# Patient Record
Sex: Male | Born: 1935
Health system: Southern US, Community
[De-identification: ages and names within clinical notes are randomized; demographics above are authoritative.]

## PROBLEM LIST (undated history)

## (undated) DIAGNOSIS — Z951 Presence of aortocoronary bypass graft: Secondary | ICD-10-CM

## (undated) DIAGNOSIS — I2510 Atherosclerotic heart disease of native coronary artery without angina pectoris: Secondary | ICD-10-CM

## (undated) DIAGNOSIS — Z7901 Long term (current) use of anticoagulants: Secondary | ICD-10-CM

## (undated) DIAGNOSIS — I4729 Other ventricular tachycardia: Secondary | ICD-10-CM

## (undated) DIAGNOSIS — T8859XA Other complications of anesthesia, initial encounter: Secondary | ICD-10-CM

## (undated) DIAGNOSIS — I480 Paroxysmal atrial fibrillation: Secondary | ICD-10-CM

## (undated) DIAGNOSIS — I350 Nonrheumatic aortic (valve) stenosis: Secondary | ICD-10-CM

## (undated) DIAGNOSIS — H04123 Dry eye syndrome of bilateral lacrimal glands: Secondary | ICD-10-CM

## (undated) DIAGNOSIS — E785 Hyperlipidemia, unspecified: Secondary | ICD-10-CM

## (undated) DIAGNOSIS — R053 Chronic cough: Secondary | ICD-10-CM

## (undated) DIAGNOSIS — I10 Essential (primary) hypertension: Secondary | ICD-10-CM

## (undated) DIAGNOSIS — K219 Gastro-esophageal reflux disease without esophagitis: Secondary | ICD-10-CM

## (undated) DIAGNOSIS — R05 Cough: Secondary | ICD-10-CM

## (undated) DIAGNOSIS — I4821 Permanent atrial fibrillation: Secondary | ICD-10-CM

## (undated) DIAGNOSIS — Z9889 Other specified postprocedural states: Secondary | ICD-10-CM

## (undated) DIAGNOSIS — R112 Nausea with vomiting, unspecified: Secondary | ICD-10-CM

## (undated) DIAGNOSIS — M1990 Unspecified osteoarthritis, unspecified site: Secondary | ICD-10-CM

## (undated) DIAGNOSIS — I472 Ventricular tachycardia: Secondary | ICD-10-CM

## (undated) DIAGNOSIS — D494 Neoplasm of unspecified behavior of bladder: Secondary | ICD-10-CM

## (undated) HISTORY — PX: CORONARY ARTERY BYPASS GRAFT: SHX141

## (undated) HISTORY — DX: Essential (primary) hypertension: I10

## (undated) HISTORY — PX: CATARACT EXTRACTION W/ INTRAOCULAR LENS IMPLANT: SHX1309

## (undated) HISTORY — PX: INGUINAL HERNIA REPAIR: SUR1180

## (undated) HISTORY — DX: Paroxysmal atrial fibrillation: I48.0

## (undated) HISTORY — PX: TONSILLECTOMY: SUR1361

## (undated) HISTORY — DX: Nonrheumatic aortic (valve) stenosis: I35.0

## (undated) HISTORY — DX: Atherosclerotic heart disease of native coronary artery without angina pectoris: I25.10

## (undated) HISTORY — PX: CARDIAC CATHETERIZATION: SHX172

## (undated) HISTORY — PX: ELECTROPHYSIOLOGY STUDY: SHX5802

## (undated) HISTORY — PX: MOHS SURGERY: SUR867

---

## 1976-04-25 HISTORY — PX: OTHER SURGICAL HISTORY: SHX169

## 1992-04-25 HISTORY — PX: KNEE ARTHROSCOPY: SUR90

## 1997-04-25 HISTORY — PX: LUMBAR DISC SURGERY: SHX700

## 1997-10-14 ENCOUNTER — Ambulatory Visit (HOSPITAL_COMMUNITY): Admission: RE | Admit: 1997-10-14 | Discharge: 1997-10-14 | Payer: Self-pay | Admitting: Gastroenterology

## 1998-02-26 ENCOUNTER — Encounter: Payer: Self-pay | Admitting: Neurological Surgery

## 1998-03-02 ENCOUNTER — Encounter: Payer: Self-pay | Admitting: Neurological Surgery

## 1998-03-02 ENCOUNTER — Inpatient Hospital Stay (HOSPITAL_COMMUNITY): Admission: RE | Admit: 1998-03-02 | Discharge: 1998-03-03 | Payer: Self-pay | Admitting: Neurological Surgery

## 1998-04-28 ENCOUNTER — Encounter: Admission: RE | Admit: 1998-04-28 | Discharge: 1998-07-27 | Payer: Self-pay | Admitting: Neurological Surgery

## 2002-04-25 DIAGNOSIS — Z951 Presence of aortocoronary bypass graft: Secondary | ICD-10-CM

## 2002-04-25 HISTORY — DX: Presence of aortocoronary bypass graft: Z95.1

## 2003-02-26 ENCOUNTER — Emergency Department (HOSPITAL_COMMUNITY): Admission: EM | Admit: 2003-02-26 | Discharge: 2003-02-26 | Payer: Self-pay | Admitting: Emergency Medicine

## 2003-03-03 ENCOUNTER — Inpatient Hospital Stay (HOSPITAL_COMMUNITY): Admission: AD | Admit: 2003-03-03 | Discharge: 2003-03-11 | Payer: Self-pay | Admitting: Cardiovascular Disease

## 2003-03-05 ENCOUNTER — Encounter (INDEPENDENT_AMBULATORY_CARE_PROVIDER_SITE_OTHER): Payer: Self-pay | Admitting: Cardiology

## 2003-04-03 ENCOUNTER — Encounter: Admission: RE | Admit: 2003-04-03 | Discharge: 2003-04-03 | Payer: Self-pay | Admitting: Cardiothoracic Surgery

## 2003-04-28 ENCOUNTER — Encounter (HOSPITAL_COMMUNITY): Admission: RE | Admit: 2003-04-28 | Discharge: 2003-07-25 | Payer: Self-pay | Admitting: *Deleted

## 2003-05-01 ENCOUNTER — Encounter: Admission: RE | Admit: 2003-05-01 | Discharge: 2003-05-01 | Payer: Self-pay | Admitting: Cardiothoracic Surgery

## 2004-03-03 ENCOUNTER — Ambulatory Visit: Payer: Self-pay | Admitting: Internal Medicine

## 2005-03-10 ENCOUNTER — Ambulatory Visit: Payer: Self-pay | Admitting: Family Medicine

## 2005-05-16 ENCOUNTER — Ambulatory Visit: Payer: Self-pay | Admitting: Internal Medicine

## 2005-07-25 ENCOUNTER — Inpatient Hospital Stay (HOSPITAL_COMMUNITY): Admission: AD | Admit: 2005-07-25 | Discharge: 2005-07-28 | Payer: Self-pay | Admitting: Cardiology

## 2005-07-25 ENCOUNTER — Ambulatory Visit: Payer: Self-pay | Admitting: Internal Medicine

## 2005-09-09 ENCOUNTER — Inpatient Hospital Stay (HOSPITAL_COMMUNITY): Admission: EM | Admit: 2005-09-09 | Discharge: 2005-09-14 | Payer: Self-pay | Admitting: Emergency Medicine

## 2005-10-05 ENCOUNTER — Emergency Department (HOSPITAL_COMMUNITY): Admission: EM | Admit: 2005-10-05 | Discharge: 2005-10-05 | Payer: Self-pay | Admitting: Emergency Medicine

## 2005-10-17 ENCOUNTER — Ambulatory Visit: Payer: Self-pay | Admitting: Internal Medicine

## 2006-02-21 ENCOUNTER — Ambulatory Visit: Payer: Self-pay | Admitting: Family Medicine

## 2006-05-30 ENCOUNTER — Ambulatory Visit: Payer: Self-pay | Admitting: Internal Medicine

## 2006-10-01 ENCOUNTER — Encounter: Payer: Self-pay | Admitting: Internal Medicine

## 2007-02-21 ENCOUNTER — Telehealth (INDEPENDENT_AMBULATORY_CARE_PROVIDER_SITE_OTHER): Payer: Self-pay | Admitting: *Deleted

## 2007-02-22 ENCOUNTER — Ambulatory Visit: Payer: Self-pay | Admitting: Internal Medicine

## 2007-02-22 ENCOUNTER — Encounter: Admission: RE | Admit: 2007-02-22 | Discharge: 2007-02-22 | Payer: Self-pay | Admitting: Internal Medicine

## 2007-02-22 DIAGNOSIS — H2100 Hyphema, unspecified eye: Secondary | ICD-10-CM | POA: Insufficient documentation

## 2007-02-22 DIAGNOSIS — I10 Essential (primary) hypertension: Secondary | ICD-10-CM | POA: Insufficient documentation

## 2007-02-22 DIAGNOSIS — R519 Headache, unspecified: Secondary | ICD-10-CM | POA: Insufficient documentation

## 2007-02-22 DIAGNOSIS — R51 Headache: Secondary | ICD-10-CM

## 2007-02-22 DIAGNOSIS — I480 Paroxysmal atrial fibrillation: Secondary | ICD-10-CM | POA: Insufficient documentation

## 2007-02-26 ENCOUNTER — Telehealth (INDEPENDENT_AMBULATORY_CARE_PROVIDER_SITE_OTHER): Payer: Self-pay | Admitting: *Deleted

## 2007-05-02 ENCOUNTER — Ambulatory Visit: Payer: Self-pay | Admitting: Internal Medicine

## 2007-05-02 DIAGNOSIS — M545 Low back pain: Secondary | ICD-10-CM

## 2007-05-07 ENCOUNTER — Telehealth: Payer: Self-pay | Admitting: Internal Medicine

## 2007-06-08 ENCOUNTER — Encounter: Payer: Self-pay | Admitting: Internal Medicine

## 2007-06-20 ENCOUNTER — Encounter: Payer: Self-pay | Admitting: Internal Medicine

## 2007-10-18 ENCOUNTER — Encounter: Payer: Self-pay | Admitting: Internal Medicine

## 2007-12-03 ENCOUNTER — Ambulatory Visit: Payer: Self-pay | Admitting: Internal Medicine

## 2007-12-03 DIAGNOSIS — J310 Chronic rhinitis: Secondary | ICD-10-CM

## 2007-12-03 DIAGNOSIS — H9120 Sudden idiopathic hearing loss, unspecified ear: Secondary | ICD-10-CM | POA: Insufficient documentation

## 2007-12-10 ENCOUNTER — Encounter: Payer: Self-pay | Admitting: Internal Medicine

## 2007-12-24 ENCOUNTER — Encounter: Payer: Self-pay | Admitting: Internal Medicine

## 2008-06-12 ENCOUNTER — Ambulatory Visit: Payer: Self-pay | Admitting: Internal Medicine

## 2008-07-07 ENCOUNTER — Encounter: Payer: Self-pay | Admitting: Internal Medicine

## 2008-07-08 ENCOUNTER — Ambulatory Visit: Payer: Self-pay | Admitting: Internal Medicine

## 2008-07-08 DIAGNOSIS — L0390 Cellulitis, unspecified: Secondary | ICD-10-CM

## 2008-07-08 DIAGNOSIS — B369 Superficial mycosis, unspecified: Secondary | ICD-10-CM | POA: Insufficient documentation

## 2008-07-08 DIAGNOSIS — L0291 Cutaneous abscess, unspecified: Secondary | ICD-10-CM | POA: Insufficient documentation

## 2008-07-10 ENCOUNTER — Ambulatory Visit: Payer: Self-pay | Admitting: Internal Medicine

## 2008-07-10 DIAGNOSIS — T887XXA Unspecified adverse effect of drug or medicament, initial encounter: Secondary | ICD-10-CM | POA: Insufficient documentation

## 2008-07-10 LAB — CONVERTED CEMR LAB: INR: 2

## 2008-07-23 ENCOUNTER — Encounter: Payer: Self-pay | Admitting: Internal Medicine

## 2008-07-30 ENCOUNTER — Encounter: Payer: Self-pay | Admitting: Internal Medicine

## 2008-07-31 ENCOUNTER — Ambulatory Visit: Payer: Self-pay | Admitting: Internal Medicine

## 2008-07-31 DIAGNOSIS — R55 Syncope and collapse: Secondary | ICD-10-CM

## 2008-07-31 DIAGNOSIS — R091 Pleurisy: Secondary | ICD-10-CM | POA: Insufficient documentation

## 2008-08-06 ENCOUNTER — Ambulatory Visit: Payer: Self-pay | Admitting: Internal Medicine

## 2008-08-07 ENCOUNTER — Encounter (INDEPENDENT_AMBULATORY_CARE_PROVIDER_SITE_OTHER): Payer: Self-pay | Admitting: *Deleted

## 2008-09-09 ENCOUNTER — Encounter: Payer: Self-pay | Admitting: Internal Medicine

## 2008-10-06 ENCOUNTER — Encounter: Payer: Self-pay | Admitting: Internal Medicine

## 2008-11-04 ENCOUNTER — Ambulatory Visit: Payer: Self-pay | Admitting: Internal Medicine

## 2008-11-04 DIAGNOSIS — R609 Edema, unspecified: Secondary | ICD-10-CM

## 2009-02-24 ENCOUNTER — Ambulatory Visit: Payer: Self-pay | Admitting: Family Medicine

## 2009-04-29 ENCOUNTER — Ambulatory Visit: Payer: Self-pay | Admitting: Family Medicine

## 2009-04-29 DIAGNOSIS — R05 Cough: Secondary | ICD-10-CM | POA: Insufficient documentation

## 2009-04-29 DIAGNOSIS — J018 Other acute sinusitis: Secondary | ICD-10-CM

## 2009-04-29 DIAGNOSIS — R059 Cough, unspecified: Secondary | ICD-10-CM | POA: Insufficient documentation

## 2009-05-12 ENCOUNTER — Telehealth: Payer: Self-pay | Admitting: Family Medicine

## 2009-08-05 ENCOUNTER — Ambulatory Visit: Payer: Self-pay | Admitting: Family Medicine

## 2009-09-17 ENCOUNTER — Ambulatory Visit: Payer: Self-pay | Admitting: Family Medicine

## 2010-01-14 ENCOUNTER — Ambulatory Visit: Payer: Self-pay | Admitting: Family Medicine

## 2010-01-29 ENCOUNTER — Encounter: Payer: Self-pay | Admitting: Internal Medicine

## 2010-03-23 ENCOUNTER — Ambulatory Visit (HOSPITAL_COMMUNITY): Admission: RE | Admit: 2010-03-23 | Discharge: 2010-03-23 | Payer: Self-pay | Admitting: Cardiovascular Disease

## 2010-03-23 ENCOUNTER — Encounter: Payer: Self-pay | Admitting: Internal Medicine

## 2010-03-23 LAB — PULMONARY FUNCTION TEST

## 2010-03-26 ENCOUNTER — Encounter: Payer: Self-pay | Admitting: Family Medicine

## 2010-05-25 NOTE — Assessment & Plan Note (Signed)
Summary: 11:15  NOT FEELING WELL/CLE   Vital Signs:  Patient profile:   76 year old male Height:      73 inches Weight:      207.25 pounds BMI:     27.44 Temp:     97.8 degrees F oral Pulse rate:   56 / minute Pulse rhythm:   regular BP sitting:   140 / 80  (left arm) Cuff size:   large  Vitals Entered By: Delilah Shan CMA Duncan Dull) (August 05, 2009 11:10 AM) CC: Sinus congestion, cough, scratchy throat.  Pt. prefers written script if he needs one.   History of Present Illness: 76 yo here for 2 weeks of worsening sinus congestion.  Has seaonsal allergies, takes Zyrtec 10 mg daily. Allergies have been worse lately so initially when he had runny nose and sneezing, thought it was just allergies. Over past week, worsening sinus pressure, keeping him up at night. Dry, hacking cough. Felt a little feverish a couple of days ago. No wheezing or SOB.  Allergy to cephalexin, not sure what it was or if he allergy to other related abx.  Current Medications (verified): 1)  Metoprolol Succinate 50 Mg Xr24h-Tab (Metoprolol Succinate) .Marland Kitchen.. 1 By Mouth Am, 1/2 By Mouth Pm 2)  Cozaar 100 Mg Tabs (Losartan Potassium) .Marland Kitchen.. 1 By Mouth Once Daily 3)  Pravastatin Sodium 80 Mg  Tabs (Pravastatin Sodium) .Marland Kitchen.. 1 By Mouth At Bedtime 4)  Warfarin Sodium 5 Mg  Tabs (Warfarin Sodium) .... 5mg  Once Daily Except 7.5 Mg On Mon/thursday 5)  Adult Aspirin Ec Low Strength 81 Mg  Tbec (Aspirin) .Marland Kitchen.. 1 By Mouth Once Daily 6)  Vitamin D 1000 Unit Tabs (Cholecalciferol) .Marland Kitchen.. 1 By Mouth Once Daily 7)  Apap/butalbital 40mg  .... As Needed 8)  Cetirizine Hcl 10 Mg Tabs (Cetirizine Hcl) .Marland Kitchen.. 1 By Mouth Once Daily 9)  Hydrochlorothiazide 12.5 Mg Caps (Hydrochlorothiazide) .Marland Kitchen.. 1 Once Daily 10)  Pacerone 100 Mg Tabs (Amiodarone Hcl) .... Take 1 Tablet By Mouth Once A Day 11)  Amlodipine Besylate 5 Mg Tabs (Amlodipine Besylate) .... Take 1 Tablet By Mouth Once A Day 12)  Azithromycin 250 Mg  Tabs (Azithromycin) .... 2 By   Mouth Today and Then 1 Daily For 4 Days  Allergies: 1)  ! Cephalexin 2)  ! Lisinopril  Review of Systems      See HPI General:  Complains of chills and fever. ENT:  Complains of nasal congestion, sinus pressure, and sore throat; denies difficulty swallowing. CV:  Denies chest pain or discomfort. Resp:  Complains of cough; denies shortness of breath, sputum productive, and wheezing.  Physical Exam  General:  in no acute distress; alert,appropriate and cooperative throughout examination non toxic appearing Nose:  mucosal erythema and mucosal edema, no obstruction.   sinuses +/- tender Mouth:  pharyngeal erythema.   no exudates Lungs:  Normal respiratory effort, chest expands symmetrically. Lungs are clear to auscultation, no crackles or wheezes. Heart:  normal rate, regular rhythm,  grade 2 /6 systolic murmur Psych:  normally interactive, good eye contact.   Impression & Recommendations:  Problem # 1:  OTHER ACUTE SINUSITIS (ICD-461.8) Assessment New Given duration and progression of symptoms, will treat with Zpack (give allergy to ceph). Continue supportive care as per pt instructions.  [facility],wants printed prescription, not electronic. His updated medication list for this problem includes:    Azithromycin 250 Mg Tabs (Azithromycin) .Marland Kitchen... 2 by  mouth today and then 1 daily for 4 days  Complete Medication List: 1)  Metoprolol Succinate 50 Mg Xr24h-tab (Metoprolol succinate) .Marland Kitchen.. 1 by mouth am, 1/2 by mouth pm 2)  Cozaar 100 Mg Tabs (Losartan potassium) .Marland Kitchen.. 1 by mouth once daily 3)  Pravastatin Sodium 80 Mg Tabs (Pravastatin sodium) .Marland Kitchen.. 1 by mouth at bedtime 4)  Warfarin Sodium 5 Mg Tabs (Warfarin sodium) .... 5mg  once daily except 7.5 mg on mon/thursday 5)  Adult Aspirin Ec Low Strength 81 Mg Tbec (Aspirin) .Marland Kitchen.. 1 by mouth once daily 6)  Vitamin D 1000 Unit Tabs (Cholecalciferol) .Marland Kitchen.. 1 by mouth once daily 7)  Apap/butalbital 40mg   .... As needed 8)   Cetirizine Hcl 10 Mg Tabs (Cetirizine hcl) .Marland Kitchen.. 1 by mouth once daily 9)  Hydrochlorothiazide 12.5 Mg Caps (Hydrochlorothiazide) .Marland Kitchen.. 1 once daily 10)  Pacerone 100 Mg Tabs (Amiodarone hcl) .... Take 1 tablet by mouth once a day 11)  Amlodipine Besylate 5 Mg Tabs (Amlodipine besylate) .... Take 1 tablet by mouth once a day 12)  Azithromycin 250 Mg Tabs (Azithromycin) .... 2 by  mouth today and then 1 daily for 4 days  Patient Instructions: 1)  Take Zpack  as directed.  Drink lots of fluids.  Treat sympotmatically with Mucinex, nasal saline irrigation, and Tylenol/Ibuprofen.  You can use warm compresses.  Cough suppressant at night. Call if not improving as expected in 5-7 days.  Prescriptions: AZITHROMYCIN 250 MG  TABS (AZITHROMYCIN) 2 by  mouth today and then 1 daily for 4 days  #6 x 0   Entered and Authorized by:   Ruthe Mannan MD   Signed by:   Ruthe Mannan MD on 08/05/2009   Method used:   Print then Give to Patient   RxID:   [id number]   Current Allergies (reviewed today): ! CEPHALEXIN ! LISINOPRIL

## 2010-05-25 NOTE — Progress Notes (Signed)
Summary: Update on condition  Phone Note Call from Patient Call back at [phone]   Caller: Patient Call For: Ruthe Mannan MD Summary of Call: Was told to call you back after he finished his Zpak. Patient states that he has finished it and is doing much better and is starting on the 14 day trial of Prilosec today. Initial call taken by: Sydell Axon LPN,  May 12, 2009 12:58 PM  Follow-up for Phone Call        Thank you. Follow-up by: Ruthe Mannan MD,  May 12, 2009 8:16 PM

## 2010-05-25 NOTE — Assessment & Plan Note (Signed)
Summary: NEW MEDICARE/[facility] TRANSFER FROM [place]   Vital Signs:  Patient profile:   76 year old male Height:      73 inches Weight:      208.25 pounds BMI:     27.57 Temp:     98.5 degrees F oral Pulse rate:   72 / minute Pulse rhythm:   regular BP sitting:   168 / 82  (left arm) Cuff size:   large  Vitals Entered By: Delilah Shan CMA Duncan Dull) (April 29, 2009 2:00 PM) CC: New Medicare patient - Transfer from [place]   History of Present Illness: Here to transfer care with:  1.  Cough x 5 months.  Dry.  Feels like something is caught in throat.  Used to have GERD symptoms but not since he had CABG.  Up until last week, no fevers, chills.  No wheezing, no shortness of breath.  2.  Sinus congestion- started a little over a week ago. Nasal congestion, chills, subjective fevers. Making his cough worse.   Tried Mucinex with no relief of symptoms.  3.  Afib- currently stable.  On amiodarone 200 mg daily, Metoprolol 50 mg 1 tab qam, 1/2 tab q pm, Coumadin.  Followed by [facility] cards (for INR as well).  4.  HTN- typically not this high (168/82) but was nervous about today's visit.  Always feels higher when he has a cold as well.  No CP, no blurred vision, no LE edema.  5.  Well man- UTD on all.  States he recently had lab work at [place], he will send it to me.  Current Medications (verified): 1)  Metoprolol Succinate 50 Mg Xr24h-Tab (Metoprolol Succinate) .Marland Kitchen.. 1 By Mouth Am, 1/2 By Mouth Pm 2)  Amiodarone Hcl 200 Mg  Tabs (Amiodarone Hcl) .Marland Kitchen.. 1 By Mouth Once Daily 3)  Cozaar 100 Mg Tabs (Losartan Potassium) .Marland Kitchen.. 1 By Mouth Once Daily 4)  Pravastatin Sodium 80 Mg  Tabs (Pravastatin Sodium) .Marland Kitchen.. 1 By Mouth At Bedtime 5)  Warfarin Sodium 5 Mg  Tabs (Warfarin Sodium) .... 5mg  Once Daily Except 7.5 Mg On Mon/thursday 6)  Adult Aspirin Ec Low Strength 81 Mg  Tbec (Aspirin) .Marland Kitchen.. 1 By Mouth Once Daily 7)  Vitamin D 1000 Unit Tabs (Cholecalciferol) .Marland Kitchen.. 1 By Mouth Once Daily 8)   Apap/butalbital 40mg  .... As Needed 9)  Cetirizine Hcl 10 Mg Tabs (Cetirizine Hcl) .Marland Kitchen.. 1 By Mouth Once Daily 10)  Hydrochlorothiazide 12.5 Mg Caps (Hydrochlorothiazide) .Marland Kitchen.. 1 Once Daily 11)  Azithromycin 250 Mg  Tabs (Azithromycin) .... 2 By  Mouth Today and Then 1 Daily For 4 Days  Allergies: 1)  ! Cephalexin 2)  ! Lisinopril  Past History:  Past Medical History: Last updated: 07/31/2008 HTN Pleurisy 20 yrs ago A-fib   Past Surgical History: h/o CABG  Social History: Retired Married Non smoker  Review of Systems      See HPI General:  Complains of chills and fever. Eyes:  Denies blurring. ENT:  Complains of postnasal drainage and sinus pressure. CV:  Denies chest pain or discomfort. Resp:  Complains of cough and sputum productive; denies shortness of breath and wheezing. GI:  Denies abdominal pain, change in bowel habits, indigestion, nausea, and vomiting.  Physical Exam  General:  in no acute distress; alert,appropriate and cooperative throughout examination Ears:  External ear exam shows no significant lesions or deformities.  Otoscopic examination reveals clear canals, tympanic membranes are intact bilaterally without bulging, retraction, inflammation or discharge. Hearing is grossly normal  bilaterally. Nose:  mucosal erythema.  +sinuses Mouth:  Oral mucosa and oropharynx without lesions or exudates.  Teeth in good repair. Lungs:  Normal respiratory effort, chest expands symmetrically. Lungs are clear to auscultation, no crackles or wheezes. Heart:  normal rate, regular rhythm,  grade 2 /6 systolic murmur Abdomen:  Bowel sounds positive,abdomen soft and non-tender without masses, organomegaly or hernias noted. Extremities:  trace left pedal edema and trace right pedal edema.   No clubbing Psych:  normally interactive, good eye contact.   Impression & Recommendations:  Problem # 1:  COUGH (ICD-786.2) Assessment New Ongoing for several months, aggrevated by  current sinusitis.  ?possible GERD vs side effects of amiodarone. After Zpack for sinusitis, advised him to call me back. If cough persists, may try PPI for a couple of weeks (with caution since he is on coumadin).  Problem # 2:  OTHER ACUTE SINUSITIS (ICD-461.8) Assessment: New Continue supportive care, Zpack given.  Advised calling [facility] to have INR checked in a day or so on abx. His updated medication list for this problem includes:    Azithromycin 250 Mg Tabs (Azithromycin) .Marland Kitchen... 2 by  mouth today and then 1 daily for 4 days  Problem # 3:  HYPERTENSION, ESSENTIAL NOS (ICD-401.9) Assessment: Deteriorated Likely white coat syndrome with new doctor along with URI.  He will check at home and keep log for me.   His updated medication list for this problem includes:    Metoprolol Succinate 50 Mg Xr24h-tab (Metoprolol succinate) .Marland Kitchen... 1 by mouth am, 1/2 by mouth pm    Cozaar 100 Mg Tabs (Losartan potassium) .Marland Kitchen... 1 by mouth once daily    Hydrochlorothiazide 12.5 Mg Caps (Hydrochlorothiazide) .Marland Kitchen... 1 once daily  Problem # 4:  FIBRILLATION, ATRIAL (ICD-427.31) Continue current meds. His updated medication list for this problem includes:    Metoprolol Succinate 50 Mg Xr24h-tab (Metoprolol succinate) .Marland Kitchen... 1 by mouth am, 1/2 by mouth pm    Amiodarone Hcl 200 Mg Tabs (Amiodarone hcl) .Marland Kitchen... 1 by mouth once daily    Warfarin Sodium 5 Mg Tabs (Warfarin sodium) ..... 5mg  once daily except 7.5 mg on mon/thursday    Adult Aspirin Ec Low Strength 81 Mg Tbec (Aspirin) .Marland Kitchen... 1 by mouth once daily  Complete Medication List: 1)  Metoprolol Succinate 50 Mg Xr24h-tab (Metoprolol succinate) .Marland Kitchen.. 1 by mouth am, 1/2 by mouth pm 2)  Amiodarone Hcl 200 Mg Tabs (Amiodarone hcl) .Marland Kitchen.. 1 by mouth once daily 3)  Cozaar 100 Mg Tabs (Losartan potassium) .Marland Kitchen.. 1 by mouth once daily 4)  Pravastatin Sodium 80 Mg Tabs (Pravastatin sodium) .Marland Kitchen.. 1 by mouth at bedtime 5)  Warfarin Sodium 5 Mg Tabs (Warfarin  sodium) .... 5mg  once daily except 7.5 mg on mon/thursday 6)  Adult Aspirin Ec Low Strength 81 Mg Tbec (Aspirin) .Marland Kitchen.. 1 by mouth once daily 7)  Vitamin D 1000 Unit Tabs (Cholecalciferol) .Marland Kitchen.. 1 by mouth once daily 8)  Apap/butalbital 40mg   .... As needed 9)  Cetirizine Hcl 10 Mg Tabs (Cetirizine hcl) .Marland Kitchen.. 1 by mouth once daily 10)  Hydrochlorothiazide 12.5 Mg Caps (Hydrochlorothiazide) .Marland Kitchen.. 1 once daily 11)  Azithromycin 250 Mg Tabs (Azithromycin) .... 2 by  mouth today and then 1 daily for 4 days  Patient Instructions: 1)  Nice to meet you, Mr. Goyer. 2)  Try taking Priosec 20 mg  daily for 4 weeks.  Let me know if that helps with your cough. 3)  I called a Zpack into your pharmacy. 4)  Try to have your records sent to [place]. 5)  Have a wonderful week. Prescriptions: AZITHROMYCIN 250 MG  TABS (AZITHROMYCIN) 2 by  mouth today and then 1 daily for 4 days  #6 x 0   Entered and Authorized by:   Ruthe Mannan MD   Signed by:   Ruthe Mannan MD on 04/29/2009   Method used:   Print then Give to Patient   RxID:   [id number] AZITHROMYCIN 250 MG  TABS (AZITHROMYCIN) 2 by  mouth today and then 1 daily for 4 days  #6 x 0   Entered and Authorized by:   Ruthe Mannan MD   Signed by:   Ruthe Mannan MD on 04/29/2009   Method used:   Electronically to        [facility]* (retail)       [address] [place], [place]  [address]       Ph: [phone] or [phone]       Fax: [phone]   RxID:   [id number]   TD Result Date:  05/11/2005 TD Result:  historical TD Next Due:  10 yr Pneumovax Result Date:  04/17/2002 Pneumovax Result:  historical Herpes Zoster Result Date:  05/17/2007 Herpes Zoster Result:  historical Flex Sig Next Due:  Not Indicated Colonoscopy Result Date:  10/05/2007 Colonoscopy Result:  polyps Colonoscopy Next Due:  5 yr Hemoccult Next Due:  Not Indicated   Current Allergies (reviewed today): !  CEPHALEXIN ! LISINOPRIL

## 2010-05-25 NOTE — Assessment & Plan Note (Signed)
Summary: STILL HAS COUGH   Vital Signs:  Patient profile:   75 year old male Height:      73 inches Weight:      208 pounds BMI:     27.54 Temp:     98.1 degrees F oral Pulse rate:   60 / minute Pulse rhythm:   regular BP sitting:   120 / 80  (right arm) Cuff size:   regular  Vitals Entered By: Linde Gillis CMA Duncan Dull) (Sep 17, 2009 3:04 PM) CC: coughing x one week   History of Present Illness: 76 yo male here for 7 days of worsening URI symptoms.  Went on a cruise two weeks ago, on his way home started to develop runny nose, sinus congestion.  Now he is wheezing and coughing up thick sputum. No fevers, shortness of breath or chest pain.  Taking Mucinex OTC.  Current Medications (verified): 1)  Metoprolol Succinate 50 Mg Xr24h-Tab (Metoprolol Succinate) .Marland Kitchen.. 1 By Mouth Am, 1/2 By Mouth Pm 2)  Cozaar 100 Mg Tabs (Losartan Potassium) .Marland Kitchen.. 1 By Mouth Once Daily 3)  Pravastatin Sodium 80 Mg  Tabs (Pravastatin Sodium) .Marland Kitchen.. 1 By Mouth At Bedtime 4)  Warfarin Sodium 5 Mg  Tabs (Warfarin Sodium) .... 5mg  Once Daily Except 7.5 Mg On Mon/thursday 5)  Adult Aspirin Ec Low Strength 81 Mg  Tbec (Aspirin) .Marland Kitchen.. 1 By Mouth Once Daily 6)  Vitamin D 1000 Unit Tabs (Cholecalciferol) .Marland Kitchen.. 1 By Mouth Once Daily 7)  Apap/butalbital 40mg  .... As Needed 8)  Cetirizine Hcl 10 Mg Tabs (Cetirizine Hcl) .Marland Kitchen.. 1 By Mouth Once Daily 9)  Hydrochlorothiazide 12.5 Mg Caps (Hydrochlorothiazide) .Marland Kitchen.. 1 Once Daily 10)  Pacerone 100 Mg Tabs (Amiodarone Hcl) .... Take 1 Tablet By Mouth Once A Day 11)  Amlodipine Besylate 5 Mg Tabs (Amlodipine Besylate) .... Take 1 Tablet By Mouth Once A Day 12)  Doxycycline Hyclate 100 Mg Caps (Doxycycline Hyclate) .... Take 1 Tab Twice A Day X 10 Days 13)  Avelox 400 Mg Tabs (Moxifloxacin Hcl) .Marland Kitchen.. 1 Tab By Mouth X 5 Days  Allergies: 1)  ! Cephalexin 2)  ! Lisinopril  Review of Systems      See HPI General:  Denies fever. ENT:  Complains of nasal congestion, sinus  pressure, and sore throat. CV:  Denies chest pain or discomfort. Resp:  Complains of cough, sputum productive, and wheezing; denies shortness of breath.  Physical Exam  General:  in no acute distress; alert,appropriate and cooperative throughout examination non toxic appearing Nose:  mucosal erythema and mucosal edema, no obstruction.   sinuses +/- tender Mouth:  pharyngeal erythema.   no exudates Lungs:  Normal respiratory effort, chest expands symmetrically. Lungs are clear to auscultation, no crackles or wheezes. Heart:  normal rate, regular rhythm,  grade 2 /6 systolic murmur Extremities:  trace left pedal edema and trace right pedal edema.   No clubbing Psych:  normally interactive, good eye contact.   Impression & Recommendations:  Problem # 1:  OTHER ACUTE SINUSITIS (ICD-461.8) Assessment New  with ?bronchitis.  Will treat with Doxcycline 100 mg by mouth two times a day x 10 days.  Continue supportive care with Mucinex. His updated medication list for this problem includes:    Doxycycline Hyclate 100 Mg Caps (Doxycycline hyclate) .Marland Kitchen... Take 1 tab twice a day x 10 days    Avelox 400 Mg Tabs (Moxifloxacin hcl) .Marland Kitchen... 1 tab by mouth x 5 days  His updated medication list for this problem includes:  Doxycycline Hyclate 100 Mg Caps (Doxycycline hyclate) .Marland Kitchen... Take 1 tab twice a day x 10 days    Avelox 400 Mg Tabs (Moxifloxacin hcl) .Marland Kitchen... 1 tab by mouth x 5 days  Complete Medication List: 1)  Metoprolol Succinate 50 Mg Xr24h-tab (Metoprolol succinate) .Marland Kitchen.. 1 by mouth am, 1/2 by mouth pm 2)  Cozaar 100 Mg Tabs (Losartan potassium) .Marland Kitchen.. 1 by mouth once daily 3)  Pravastatin Sodium 80 Mg Tabs (Pravastatin sodium) .Marland Kitchen.. 1 by mouth at bedtime 4)  Warfarin Sodium 5 Mg Tabs (Warfarin sodium) .... 5mg  once daily except 7.5 mg on mon/thursday 5)  Adult Aspirin Ec Low Strength 81 Mg Tbec (Aspirin) .Marland Kitchen.. 1 by mouth once daily 6)  Vitamin D 1000 Unit Tabs (Cholecalciferol) .Marland Kitchen.. 1 by mouth  once daily 7)  Apap/butalbital 40mg   .... As needed 8)  Cetirizine Hcl 10 Mg Tabs (Cetirizine hcl) .Marland Kitchen.. 1 by mouth once daily 9)  Hydrochlorothiazide 12.5 Mg Caps (Hydrochlorothiazide) .Marland Kitchen.. 1 once daily 10)  Pacerone 100 Mg Tabs (Amiodarone hcl) .... Take 1 tablet by mouth once a day 11)  Doxycycline Hyclate 100 Mg Caps (Doxycycline hyclate) .... Take 1 tab twice a day x 10 days 12)  Avelox 400 Mg Tabs (Moxifloxacin hcl) .Marland Kitchen.. 1 tab by mouth x 5 days Prescriptions: AVELOX 400 MG TABS (MOXIFLOXACIN HCL) 1 tab by mouth x 5 days  #5 x 0   Entered and Authorized by:   Ruthe Mannan MD   Signed by:   Ruthe Mannan MD on 09/17/2009   Method used:   Print then Give to Patient   RxID:   [id number] DOXYCYCLINE HYCLATE 100 MG CAPS (DOXYCYCLINE HYCLATE) Take 1 tab twice a day x 10 days  #20 x 0   Entered and Authorized by:   Ruthe Mannan MD   Signed by:   Ruthe Mannan MD on 09/17/2009   Method used:   Print then Give to Patient   RxID:   [id number]   Current Allergies (reviewed today): ! CEPHALEXIN ! LISINOPRIL

## 2010-05-25 NOTE — Letter (Signed)
Summary: [facility]  [facility]   Imported By: Lennie Odor 02/10/2010 15:53:36  _____________________________________________________________________  External Attachment:    Type:   Image     Comment:   External Document

## 2010-05-25 NOTE — Assessment & Plan Note (Signed)
Summary: flu shot/aron/dlo  Nurse Visit   Allergies: 1)  ! Cephalexin 2)  ! Lisinopril  Immunizations Administered:  Influenza Vaccine # 1:    Vaccine Type: Fluvax 3+    Site: left deltoid    Mfr: [employer]    Dose: 0.5 ml    Route: IM    Given by: Mervin Hack CMA (AAMA)    Exp. Date: 10/23/2010    Lot #: [id number]    VIS given: 11/17/09 version given January 14, 2010.  Flu Vaccine Consent Questions:    Do you have a history of severe allergic reactions to this vaccine? no    Any prior history of allergic reactions to egg and/or gelatin? no    Do you have a sensitivity to the preservative Thimersol? no    Do you have a past history of Guillan-Barre Syndrome? no    Do you currently have an acute febrile illness? no    Have you ever had a severe reaction to latex? no    Vaccine information given and explained to patient? yes  Orders Added: 1)  Flu Vaccine 76yrs + [90658] 2)  Admin 1st Vaccine [91478]

## 2010-05-27 NOTE — Letter (Signed)
Summary: [facility]  [facility]   Imported By: Maryln Gottron 04/09/2010 14:52:35  _____________________________________________________________________  External Attachment:    Type:   Image     Comment:   External Document

## 2010-09-06 ENCOUNTER — Encounter (HOSPITAL_COMMUNITY): Payer: Self-pay | Attending: Cardiovascular Disease

## 2010-09-06 DIAGNOSIS — E785 Hyperlipidemia, unspecified: Secondary | ICD-10-CM | POA: Insufficient documentation

## 2010-09-06 DIAGNOSIS — Z5189 Encounter for other specified aftercare: Secondary | ICD-10-CM | POA: Insufficient documentation

## 2010-09-06 DIAGNOSIS — Z951 Presence of aortocoronary bypass graft: Secondary | ICD-10-CM | POA: Insufficient documentation

## 2010-09-06 DIAGNOSIS — I10 Essential (primary) hypertension: Secondary | ICD-10-CM | POA: Insufficient documentation

## 2010-09-06 DIAGNOSIS — Z87891 Personal history of nicotine dependence: Secondary | ICD-10-CM | POA: Insufficient documentation

## 2010-09-06 DIAGNOSIS — I2510 Atherosclerotic heart disease of native coronary artery without angina pectoris: Secondary | ICD-10-CM | POA: Insufficient documentation

## 2010-09-06 DIAGNOSIS — I4891 Unspecified atrial fibrillation: Secondary | ICD-10-CM | POA: Insufficient documentation

## 2010-09-07 ENCOUNTER — Encounter (HOSPITAL_COMMUNITY): Payer: Self-pay

## 2010-09-09 ENCOUNTER — Encounter (HOSPITAL_COMMUNITY): Payer: Self-pay

## 2010-09-10 NOTE — Discharge Summary (Signed)
NAME:  [name] [name], [name] [name] NO.:  [id number]   MEDICAL RECORD NO.:  [id number]                   PATIENT TYPE:  INP   LOCATION:  2031                                 FACILITY:  [facility]   PHYSICIAN:  Gwenith Daily. Tyrone Sage, M.D.            DATE OF BIRTH:  1935/02/25   DATE OF ADMISSION:  03/03/2003  DATE OF DISCHARGE:  03/11/2003                                 DISCHARGE SUMMARY   ADMITTING DIAGNOSES:  Coronary artery disease.   PAST MEDICAL HISTORY:  Paroxysmal atrial fibrillation   Dictation ended at this point.      Toribio Harbour, N.P.                  Gwenith Daily Tyrone Sage, M.D.    CTK/MEDQ  D:  03/11/2003  T:  03/11/2003  Job:  [id number]

## 2010-09-10 NOTE — Discharge Summary (Signed)
NAME:  TAMAJ, JURGENS NO.:  [id number]   MEDICAL RECORD NO.:  [id number]                   PATIENT TYPE:  INP   LOCATION:  2031                                 FACILITY:  [facility]   PHYSICIAN:  Gwenith Daily. Tyrone Sage, M.D.            DATE OF BIRTH:  1935/03/19   DATE OF ADMISSION:  03/03/2003  DATE OF DISCHARGE:  03/11/2003                                 DISCHARGE SUMMARY   DATE OF SURGERY:  March 07, 2003.   ADMITTING DIAGNOSIS:  Coronary artery disease.   PAST MEDICAL HISTORY:  History of paroxysmal atrial fibrillation, last in  1991.   PAST SURGICAL HISTORY:  1. Hernia repair, 1954 and 1990.  2. Polyp removed from the vocal cords 1978.  3. Repair of herniated disk L4-L5, 1999.   DRUG ALLERGIES:  No actual allergies; however, the patient notes a blood  pressure medicine caused a rash.  He does not remember which one.  Also, he  had been pain when he took statin many years ago.  He has not resumed the  statin.   DISCHARGE DIAGNOSES:  1. Three vessel coronary artery disease, status post coronary artery bypass     graft times five.  2. Postoperative atrial fibrillation, resolved on amiodarone.   BRIEF HISTORY:  Mr. Thornsberry is a 76 year old Caucasian man.  He presented  to the [facility] Emergency Room with a two week history of vague increasing  chest discomfort especially with exercise.  He was seen in the emergency  department on February 27, 2003 and was discharged home with instructions to  followup with his cardiologist.  He was evaluated by Dr. Jenne Campus who  recommended a Cardiolite stress test.  This was strongly positive for  ischemia, and he was referred for a cardiac catheterization.   HOSPITAL COURSE:  On March 03, 2003, Mr. Drumwright was admitted to [facility] in the care of the [facility]  Cardiology service.  Dr. Jenne Campus reviewed his Cardiolite stress exam and  agreed with the need for  cardiac catheterization.  This was performed on the  morning of March 04, 2003.  Catheterization revealed anterolateral  akinesis and apical dyskinesis, and very mild aortic insufficiency.  He has  three vessel coronary artery disease.  Ejection fraction estimated to be 40-  45%.  Because his lesion is not amenable to PCI, a cardiac surgery  consultation was requested.  He was seen later in the day by Dr. Sheliah Plane.  After examination of the patient, reviewed the available records  including the catheterization films, Dr. Tyrone Sage agreed that coronary  artery bypass grafting was the preferred treatment of choice for this  patient.  He was scheduled for March 07, 2003 to allow time for him to be  off his NSAID as well as followup echocardiogram to evaluate his aortic  valve.  Mr. Whittinghill remained stable in the hospital prior to his surgery.  A 2D echocardiogram was performed on March 05, 2003.  In summary:  There  were no left ventricular regional wall motion abnormalities.  The aortic  valve thickness was mildly increased, mildly reduced aortic leaflet  excursion consistent with mild aortic valve stenosis.  Estimated aortic  valve area was 2.15-cm2.  There was moderate fibrocalcific changes to the  aortic root.  The right ventricle was dilated.  Tricuspid regurgitation jet  was central, right atrium was dilated.  Dr. Tyrone Sage reviewed the results of  these prior to his surgery.  Also prior to surgery, he underwent arterial  evaluation.  Carotid duplex study revealed no significant carotid artery  disease.  His upper extremity exam included right Allen's test which was  abnormal.  Lower extremity exam revealed palpable pedal pulses bilaterally.   March 07, 2003, Mr. Mogel underwent the following surgical procedure  with Dr. Ramon Dredge B. Gerhardt:  1)  Coronary artery bypass grafting x 5.  Grafts placed at the time of procedure:  Left internal mammary artery  grafted  to the left anterior descending artery, saphenous vein graft to the  diagonal artery, saphenous vein graft to the circumflex artery, saphenous  vein graft in a sequential fashion to posterior descending and posterior  lateral arteries.  Vein was harvested from the right side with an endovein  harvesting technique through the right mid calf which was done with an open  surgical technique.  2)  Left arterial line placement.   Mr. Lechuga tolerated these procedures well, transferring in stable  condition to the SICU.  He remained hemodynamically stable in the immediate  postoperative period.  He was extubated several hours after arrival in the  intensive care unit.  He awoke from anesthesia neurologically intact.  With  Mr. Shaddock's history of atrial fibrillation, he was started  prophylactically on amiodarone.  Mr. Kazmi only required routine care  in the intensive care unit and was transferred to unit 2000 on postoperative  day two.  Later in the day on postoperative day two, he did develop rapid  atrial fibrillation.  He was started on Cardizem and was back in sinus  rhythm by postoperative day three.  His Cardizem was discontinued and his  beta-blocker was increased.  Mr. Quevedo has remained in sinus rhythm  since.   On March 11, 2003, postoperative day four, Mr. Podgorski reports feeling  very well.  His vital signs are stable.  He has maintained a normal sinus  rhythm for 24 hours.  His breathing is non-labored.  He has a good  respiratory effort.  His surgical incisions are all healing well.  There is  some bruising to the right thigh secondary to his endovein harvest and  cardiac catheterization.  Dr. Tyrone Sage recommendation was to allow Mr.  Blancett to go home this afternoon if he remains in sinus rhythm.  He will  go home on a tapering dose of amiodarone.  Later in the morning, he was seen by Dr. Domingo Sep of [facility].  Her   recommendation regarding statin.  She notes that he did not tolerate statin  approximately a year ago secondary to myalgias.  She recommends trying a  different statin.  Choice of statin can be determined on his followup visit  with Dr. Jenne Campus on March 25, 2003.   LABORATORY STUDIES:  March 10, 2003:  CBC, white blood cells 8.1,  hemoglobin 8.2, hematocrit 23.5, platelets 178.  Chemistries included sodium  of 135, potassium 3.9, BUN 14,  creatinine 1.0, glucose 127.   CONDITION ON DISCHARGE:  Improved.   INSTRUCTIONS ON DISCHARGE:  Include:   MEDICATIONS:  New medications:  1. Amiodarone 400 mg b.i.d. through March 14, 2003, and he is to start     200 mg b.i.d.  2. Lopressor 50 mg b.i.d.  3. Folic acid 1 mg daily.  4. Colace 200 mg daily.  He is instructed to resume the following home medications:  1. HCTZ 25 mg every day, this is a new dose for him.  2. Gemfibrozil 600 mg every day.  3. Niacin 500 mg q.h.s.  4. Aspirin 325 mg each evening.  For pain management he may have:  1. Tylox 1-2 p.o. q.4-6h. p.r.n.   ACTIVITY:  He has been asked to refrain from any driving or any heavy  lifting, pushing or pulling, also instructed to continue his breathing  exercises and daily walking.   DIET:  Continue to be heart healthy diet.   WOUND CARE:  He is to shower daily with mild soap and water.  If his  incision shows any signs of infection or if he has a fever greater than 101  degrees Fahrenheit, he is to call Dr. Dennie Maizes office.   FOLLOWUP:  1. Dr. Jenne Campus would like to see him in his office on March 25, 2003 at 9     a.m.  2. Dr. Tyrone Sage would like to see him at the [facility] office on Thursday,     April 03, 2003 at 11:30.  He will be asked to have a chest x-ray at     10:30 that morning.      Toribio Harbour, N.P.                  Gwenith Daily Tyrone Sage, M.D.    CTK/MEDQ  D:  03/11/2003  T:  03/11/2003  Job:  [id number]   cc:   Gwenith Daily. Tyrone Sage, M.D.  [address]  [place]  [place] [address]   Darlin Priestly, M.D.  [phone] [address]  [place]  [place] [address]  Fax: [phone]   Titus Dubin. Alwyn Ren, M.D. [facility]

## 2010-09-10 NOTE — Cardiovascular Report (Signed)
NAMELONELL, STAMOS NO.:  [id number]   MEDICAL RECORD NO.:  [id number]          PATIENT TYPE:  INP   LOCATION:  2107                         FACILITY:  [facility]   PHYSICIAN:  Madaline Savage, M.D.DATE OF BIRTH:  August 07, 1934   DATE OF PROCEDURE:  07/26/2005  DATE OF DISCHARGE:                              CARDIAC CATHETERIZATION   PROCEDURES PERFORMED:  1.  Selective coronary angiography by Judkins technique.  2.  Retrograde left heart catheterization.  3.  Left ventricular angiography.  4.  Successful percutaneous right femoral artery closure with Angio-Seal.   COMPLICATIONS:  None.   ENTRY SITE:  Right femoral.   DYE USED:  Omnipaque.   PATIENT PROFILE:  The patient is a 76 year old gentleman who was  hospitalized yesterday, July 25, 2005, for tachy palpitations and documented  recurrent nonsustained ventricular tachycardia episodes.  These events have  been occurring for approximately one week and had worsened the night before  the patient came to my office which was yesterday, July 25, 2005.  During  hospitalization last night he was loaded with amiodarone and his ventricular  tachycardia has been prevented in terms of any further episodes.   The patient has not had any shortness of breath or chest pain episodes of  the last week. He has only had tachy palpitations.  He takes a diuretic for  his blood pressure.  He also had an episode of loose brown stool that he  described as diarrhea that was a single episode yet his potassium level on  hospital entry was 3.7.  Cardiac enzymes have been negative for myocardial  infarction. EKGs have been negative for myocardial infarction.  The patient  has a history of coronary artery bypass grafting in 2004 and is known to  have mild aortic stenosis and mild aortic insufficiency.   Today the patient presents to the cardiac cath lab for diagnostic cardiac  catheterization.  This procedure was performed via right  percutaneous  femoral approach without complications.   RESULTS:  Pressures:  The left ventricular pressure was 150/70, end  diastolic pressure 17.  Central aortic pressure 150/75, mean of 110.  No  aortic valve gradient by pullback technique   ANGIOGRAPHIC RESULTS:  The patient had calcifications in his left main and  calcifications in his proximal LAD.  No valvular or pericardial  calcification was seen.   The left anterior descending coronary artery is a thread-like vessel in the  ostial portion of the vessel over a long segment.  It then becomes occluded  at the junction of the proximal and mid LAD and no antegrade flow is seen  thereafter. There is tubular calcification as previously noted.   Left circumflex is a nondominant vessel giving rise two obtuse marginal  branches, one proximal which is small and a second obtuse marginal branch  which is larger vessel of medium size and bifurcates distally.   The right coronary artery is a large and dominant vessel giving rise to  several posterior lateral branches, a single posterior descending branch.  This vessel has a shepherd's crook deformity proximally, some calcification  mid, and has a  stenosis of 75% mid.   Saphenous vein graft to obtuse marginal branch and sore saphenous vein graft  to the right coronary artery contains two touchdown sites, one posterior  descending and one posterolateral. Both touchdown sites are pristine, the  graft is widely patent.   The saphenous vein graft to the diagonal branch is widely patent.  This  diagonal is small distally but is patent.   The vein graft to the obtuse marginal branch two was widely patent  throughout and the distal runoff is excellent.   Internal mammary artery graft to LAD is widely patent including distal  anastomotic site and the runoff into the LAD is brisk TIMI III in class and  the vessel was 2.25-2.5 mm in diameter.   Left ventricular angiogram shows good  contractility, EF 50%, no wall motion  abnormalities and mitral regurgitation.   Right percutaneous femoral Angio-Seal was performed after the patient had a  hand injection in his right femoral artery showing good location of the  sheath to predict a good placement.   FINAL DIAGNOSIS:  1.  Nonsustained VT recurrent leading to hospitalization for tachy      palpitations.  2.  Three-vessel coronary artery disease as described above.  3.  Widely patent grafts to:      1.  LIMA to LAD.      2.  Saphenous vein graft OM two.      3.  Patent saphenous vein graft to right coronary artery sequential and          patent saphenous vein graft to diagonal.  4.  LV EF 50%.   PLAN:  The patient will have an EP consult, continue Amiodarone loading, and  be considered for an internal defibrillator.           ______________________________  Madaline Savage, M.D.     WHG/MEDQ  D:  07/26/2005  T:  07/26/2005  Job:  [id number]   cc:   Darlin Priestly, MD  Fax: [phone]   Duke Salvia, M.D.  [phone] [address]  [address]  [place]  [place] [address]

## 2010-09-10 NOTE — H&P (Signed)
NAME:  GOTHAM, RADEN NO.:  [id number]   MEDICAL RECORD NO.:  [id number]          PATIENT TYPE:  EMS   LOCATION:  MAJO                         FACILITY:  [facility]   PHYSICIAN:  Darlin Priestly, MD  DATE OF BIRTH:  [birth year]   DATE OF ADMISSION:  09/09/2005  DATE OF DISCHARGE:                                HISTORY & PHYSICAL   CHIEF COMPLAINTS:  Irregular heart rate.   HISTORY OF PRESENT ILLNESS:  Mr. Treat is a 76 year old male who was  followed by Dr. Jenne Campus.  He had bypass surgery in November 2004.  He has  recently had nonsustained V-tach and was admitted for EP evaluation in April  2007.  He was seen by Dr. Ladona Ridgel.  He was non inducible at EP studies  suggesting RV outflow tract ventricular tachycardia.  He was cathed during  that admission which showed a patent LIMA to the LAD, patent SVG to  diagonal, patent SVG to the OM-2, a patent SVG to PDA and PLA.  His EF was  50%.  Plan was for medical therapy.  We stopped his Norvasc and put him on  Calan.  We saw him in the office 08/11/2005.  He was doing well at that  time.  We told him he could take an extra metoprolol 25 mg as needed for  occasional palpitations.  He presents to the emergency room now with  complaints of irregular heart rate since Tuesday.  The patient said he did  well until Monday night.  He played tennis Monday night and when he awakened  Tuesday he noted some irregular rhythm in his chest.  He described this as a  vague anxiety feeling in his chest.  He called the office yesterday.  We  told him to take an extra Calan for a couple days to see if this would help.  He did this yesterday and this morning with no improvement and he came to  the emergency room for further evaluation.  In the emergency room he is in  atrial fibrillation which is new for him.  His ventricular response is about  90.  Denies any chest pain or shortness of breath.  He has not had syncope  or near-syncope.   PAST MEDICAL HISTORY:  Remarkable for hypertension.  He has dyslipidemia.  Previous surgeries include hernia repair 1954 and 1990, polypectomy in 1978,  and L4-L5 disk surgery 1999.   CURRENT MEDICATIONS:  1.  Lisinopril 20 mg a day.  2.  Fluvastatin 80 mg a day.  3.  Loratadine 10 mg a day.  4.  Aspirin daily.  5.  Metoprolol 25 mg b.i.d.  6.  Verapamil 120 mg a day.   ALLERGIES:  He has no known drug allergies but is intolerant to ZOCOR which  caused myalgias.   SOCIAL HISTORY:  He is divorced and has three children and three  grandchildren.  He is a remote smoker.   FAMILY HISTORY:  Is unremarkable.   REVIEW OF SYSTEMS:  Essentially unremarkable except for above.  He denies  any GI bleeding or melena.  He has not had fever  or chills.  Has not taken  any over-the-counter medications.   PHYSICAL EXAM:  VITAL SIGNS:  118/72, pulse 90, respirations 12.  GENERAL:  He is a well-developed, well-nourished male in no acute distress.  HEENT: Normocephalic, atraumatic.  Extraocular movements intact.  Sclerae  anicteric.  NECK:  Without JVD and without bruit.  CHEST: Clear to auscultation and percussion.  HEART:  Exam reveals irregularly irregular rhythm without obvious murmur,  rub or gallop.  ABDOMEN:  Nontender.  No bruits.  EXTREMITIES: Without edema.  Distal pulses are intact.  NEUROLOGY:  Neuro exam grossly intact.  He is awake, alert and oriented,  cooperative and moves all extremities without obvious deficit.   His EKG shows atrial fibrillation with occasional PVC and couplets.   LABORATORY DATA:  Labs were pending.   Chest x-ray from 07/25/2005 showed no active disease.   IMPRESSION:  1.  Atrial fibrillation, new problem for Mr. Massoud.  2.  Recent RV outflow tract V-tach, evaluated by Dr. Ladona Ridgel.  3.  Coronary disease, coronary artery bypass grafting November 2004 with      recent catheterization revealing patent grafts and good LV function.  4.  Treated  hypertension.  5.  Treated dyslipidemia with history of Zocor intolerance.  6.  Remote smoker.   PLAN:  The patient will be admitted for IV heparin and Coumadin.  We will  make further adjustments to his medications per Dr. Jenne Campus.      Abelino Derrick, P.A.      Darlin Priestly, MD  Electronically Signed    LKK/MEDQ  D:  09/09/2005  T:  09/09/2005  Job:  [id number]   cc:   Titus Dubin. Alwyn Ren, M.D. [facility]  [phone] [place]  [place] [address]

## 2010-09-10 NOTE — Consult Note (Signed)
NAME:  [name] [name], [name] [name]                     ACCOUNT NO.:  [id number]   MEDICAL RECORD NO.:  [id number]                   PATIENT TYPE:  INP   LOCATION:  2015                                 FACILITY:  [facility]   PHYSICIAN:  Gwenith Daily. Tyrone Sage, M.D.            DATE OF BIRTH:  June 19, 1934   DATE OF CONSULTATION:  03/04/2003  DATE OF DISCHARGE:                                   CONSULTATION   REQUESTING PHYSICIAN:  Dr. Jenne Campus.   FOLLOWUP CARDIOLOGIST:  Dr. Alanda Amass.   PRIMARY CARE PHYSICIAN:  Dr. Alwyn Ren.   REASON FOR CONSULTATION:  Severe three-vessel coronary artery disease and  mild to moderate left ventricular dysfunction.   HISTORY OF PRESENT ILLNESS:  The patient is a 76 year old male who presented  with a two-week history of vague increasing chest discomfort, especially  with exercise. He notes that he walks up a hill each morning which began  brining on chest discomfort and then heaviness in both arms. This again  occurred while he was playing tennis. He presented to [facility] emergency  room on November 4 and was then discharged home subsequently, and he saw Dr.  Jenne Campus. A stress test was positive, and he was referred for cardiac  catheterization. The patient has previous history of cardiac catheterization  in 1991. At that time, he presented with paroxysmal atrial fibrillation,  brief episode, and had a false-positive stress test, and at that time  underwent cardiac catheterization. He has had no recurrent atrial  fibrillation in the past 8 to 10 years. He has had no prior myocardial  infarction, angioplasty, or cardiac surgery. He did have an echocardiogram  in 1998 which showed mild aortic insufficiency, no aortic stenosis. Report  of this was revealed and obtained from the [facility] office and placed on the  patient's chart. Cardiac risk factors include hypertension. His lipid status  was unknown. He has a remote history of smoking, quitting in 1982.   He has a  family history of a father who died at age 32 of MVA. Mother of  cancer at age 1. He has three brothers, one who has had bypass surgery. No  previous stroke. No peripheral vascular disease. No known renal  insufficiency.   PAST MEDICAL HISTORY:  As noted above, PAF in 1991.   PAST SURGICAL HISTORY:  1. Hernia repairs in 1954 and 1990.  2. Polyp removal from the vocal cord in 1978.  3. Repair of herniated disk L4-L5 1999.   SOCIAL HISTORY:  The patient lives with his ex-wife. Has three children and  three grandchildren. He is retired from [employer]. Drinks occasional alcohol.   MEDICATIONS:  1. Etodolac 40 mg a day.  2. Hydrochlorothiazide 25 mg one and a half tablet a day.  3. Lisinopril 20 mg a day.  4. Aspirin one a day.  5. Loratadine 10 mg a day.  6. Gemfibrozil 600 mg a day.  7. Niacin 500 mg a day.  8. Verapamil 180 mg a day.  9. Multivitamin.  10.      Vitamin C.   DRUG ALLERGIES:  The patient notes some blood pressure medicine in the past  caused a rash but does not remember which one. In addition, he noted back  pain when he took a statin years ago. He is not taking one recently.   REVIEW OF SYSTEMS:  CARDIAC:  Positive for chest pain, resting shortness of  breath, exertional shortness of breath, and palpitations. Denies syncope,  presyncope, orthopnea, or lower extremity edema. GENERAL:  Denies any  constitutional symptoms such as fever, chills, or night sweats. RESPIRATORY:  Has a history of allergic rhinitis. GENITOURINARY:  Status post colonoscopy  in September 2004 by Dr. Kinnie Scales. NEUROLOGICAL:  Denies any TIAs or amaurosis  symptoms. MUSCULOSKELETAL:  Arthritis, especially in the hands and ankles.  GENITOURINARY:  Positive for nocturia, negative for hematuria. Has some  trouble with urinary stream. Denies any recent infections. Denies any easy  bruisability or other coagulopathy. Denies any psychiatric history. Denies  TIAs or amaurosis. EYES:  Wears glasses.    PHYSICAL EXAMINATION:  VITAL SIGNS:  Blood pressure 118/80, pulse 68 and  regular, respiratory rate 18, O2 saturation is 96% on room air, 6 feet 11  inches tall, 210.9 pounds.  GENERAL:  The patient is a healthy appearing 76 year old male, alert and  able to relate his history without difficulty.  HEENT:  Pupils are equal, round, and reactive to light.  NECK:  Jugular venous distention or carotid bruits.  CHEST:  Grade 2/6 diastolic murmur heard at the left apex consistent with  aortic insufficiency.  ABDOMEN:  Benign without palpable masses or tenderness.  EXTREMITIES:  Without pedal edema. He appears to have adequate length in  both lower legs.  NEUROLOGICAL:  Grossly intact. There is no palpable cervical,  supraclavicular lymph nodes.  SKIN:  Without ischemic changes.  VASCULAR EXAM:  Reveals 2+ PT and DP pulses. Catheterization site without  abnormality.   LABORATORY FINDINGS:  Hematocrit 42%, white count 4.9, platelet count  271,000. PT 12.9, INR 1, PTT 30. Sodium 139, creatinine 0.9, glucose 106.  TSH 1.97.   Cardiac catheterization films were reviewed showing anterolateral akinesis  and apical dyskinesis, very mild aortic insufficiency. LAD has a long 90%  proximal lesion, prior to a reasonable first diagonal after the take off of  the diagonal to LAD totally occluded. Circumflex has a 60% ostial and then a  90% proximal lesion. Right coronary artery has a 50% proximal and a 80 to  90% proximal lesion lateral to the LAD.   IMPRESSION AND RECOMMENDATIONS:  A 76 year old male with two week history of  increasing anginal symptoms, positive stress test, and three-vessel coronary  artery disease. He also has a very distant history of paroxysmal atrial  fibrillation in 1991 but none since. After reviewing the films and seeing  the patient, I agreed with the recommendations to proceed with coronary artery bypass grafting with his symptoms, positive stress test, and  significant  three-vessel disease. The risks of the procedure and options  were discussed with the patient including the risk of death, infection,  stroke, myocardial infarction, need for blood transfusion. Possible use of  radial artery was also discussed. The patient had his questions answered.   We planned to proceed with bypass surgery, but after several days off of  Adalat and also would need to obtain a recent echocardiogram to evaluate the  valve situation. Tentative plan for  surgery on Friday March 07, 2003.                                               Gwenith Daily Tyrone Sage, M.D.    Tyson Babinski  D:  03/04/2003  T:  03/05/2003  Job:  [id number]

## 2010-09-10 NOTE — Discharge Summary (Signed)
NAMELAZAR, TIERCE NO.:  [id number]   MEDICAL RECORD NO.:  [id number]          PATIENT TYPE:  INP   LOCATION:  3706                         FACILITY:  [facility]   PHYSICIAN:  Darlin Priestly, MD  DATE OF BIRTH:  11/04/1934   DATE OF ADMISSION:  09/09/2005  DATE OF DISCHARGE:  09/14/2005                                 DISCHARGE SUMMARY   DISCHARGE DIAGNOSES:  1.  Paroxysmal atrial fibrillation, sinus rhythm at discharge.  2.  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  3.  Coronary artery bypass grafting November of 2004 with patent grafts      April of 2007.  4.  Good left ventricular function with an ejection fraction of 50%.  5.  Treated hypertension, now somewhat hypotensive.  6.  Treated hyperlipidemia.   HOSPITAL COURSE:  The patient is a 76 year old male who is followed by Dr.  Jenne Campus and Dr. Alwyn Ren with a history of coronary disease.  He had had some  nonsustained V-tach.  He had an EP study and a diagnostic catheterization  last month.  EP study revealed RV outflow tract ventricular tachycardia.  Plan is for medical therapy only.  His grafts were patent, and his LV  function was good.  He was changed from Norvasc to Calan.  He did well until  Tuesday prior to admission when he noted some irregular heart rate.  He  denies any chest pain or syncope.  He came to the emergency room Sep 09, 2005 and was noted to be in atrial fibrillation with a rate of 90.  He was  seen by Dr. Jenne Campus.  He was admitted, put on heparin which was later  changed to Lovenox.  Coumadin was started.  Amiodarone was added.  We did  increase his beta-blocker initially to 50 mg b.i.d.  He was hypotensive but  not symptomatic.  We did stop his lisinopril hydrochlorothiazide.  We  attempted to get him Lovenox as an outpatient.  He gets his medicines  through the [place], and unfortunately, this was a lengthy  process which  ultimately was not successful.  We were able to get him Lovenox through the  drug company.  He is discharged on Sep 14, 2005.  He is in sinus rhythm at  discharge.  We cut his metoprolol back to 25 mg b.i.d. and amiodarone back  to 200 mg b.i.d..  His INR is 1.5.  He will follow up with Dr. Jenne Campus in a  couple weeks.  He will have a protime on Friday.   DISCHARGE MEDICATIONS:  1.  Lovenox 90 mg subcu b.i.d.  2.  Coumadin 7.5 mg a day or as directed.  3.  Amiodarone 20 mg twice a day.  4.  Verelan 120 mg a day.  5.  Prilosec OTC once a day.  6.  Metoprolol 25 mg b.i.d.  7.  Aspirin 81 mg a day.  8.  Fluvastatin 80 mg a day.  9.  Loratadine 10 mg a day.   DISCHARGE LABORATORY DATA:  INR is  1.5.  White count 5.5, hemoglobin 13.7,  hematocrit 40.3, platelets 188.  Sodium 141, potassium 3.6, BUN 10,  creatinine 0.9.  Troponins were negative x2.  TSH is 1.25.  Urinalysis  unremarkable.   DISPOSITION:  The patient is discharged in stable condition in sinus rhythm.  His INR today is 1.5.  He will have a protime on Friday morning.  INR goal  will be 2 to 3.      Abelino Derrick, P.A.      Darlin Priestly, MD  Electronically Signed    LKK/MEDQ  D:  09/14/2005  T:  09/14/2005  Job:  [id number]   cc:   Titus Dubin. Alwyn Ren, M.D. [facility]  [phone] [place]  [place] [address]

## 2010-09-10 NOTE — Discharge Summary (Signed)
NAMEALEXYS, GASSETT NO.:  [id number]   MEDICAL RECORD NO.:  [id number]          PATIENT TYPE:  INP   LOCATION:  3701                         FACILITY:  [facility]   PHYSICIAN:  Madaline Savage, M.D.DATE OF BIRTH:  [birth year]   DATE OF ADMISSION:  07/25/2005  DATE OF DISCHARGE:  07/28/2005                                 DISCHARGE SUMMARY   Mr. Kucher was a 76 year old white married male patient of Dr. Lenise Herald who was seen in the office by Dr. Elsie Lincoln secondary to feeling jumpy  and nervous with presyncope and some dizziness.  He was hooked up to the  monitor and was having recurrent frequent runs of NSVT, rate of 160.  He was  having at least three to four episodes a minute.  Thus, he was given some  amiodarone by bolus in the office and he was transferred to [facility] by EMS.  He was put on IV amiodarone and, I believe, IV lidocaine.  He had two sets of negative cardiac enzymes.  It was decided that he should  undergo cardiac catheterization.  This was performed on July 26, 2005.  His  grafts were all patent, his EF was 50%.  An EP consult was called and he  then underwent EP study on July 27, 2005.  He had no inducible ventricular  tachycardia and no inducible SVT.  He was seen by Dr. Elsie Lincoln on July 28, 2005.  His beta blocker was increased and he was also placed on Norvasc and  thought he was stable to be discharged home.  His blood pressure is 109/72,  pulse is 52, respirations were 18, temperature was 97.9.  He apparently had  no further runs of NSVT during his hospitalization.   LABORATORIES:  Hemoglobin 13.2, hematocrit 38.5, wbc's 5.8, and platelets  205.  Sodium 139, potassium 4.1, BUN 11, creatinine 0.9.  On admission his  potassium is 3.7.  His magnesium is 2.3.  CK-MB and troponin was negative  x3.  TSH was 1.261.  His chest x-ray showed no acute cardiopulmonary  process.   DISCHARGE MEDICATIONS:  1.  Fluvastatin 80 mg one time  per day.  2.  Lisinopril with hydrochlorothiazide 20/12.5 every day.  3.  Metoprolol 25 mg two times per day.  4.  Norvasc 5 mg one time per day.  5.  Loratadine 10 mg one time per day.  6.  Aspirin 325 mg once a day.   He should do no strenuous activity, no pushing, pulling or lifting for 5  days.  He should not sit in a tub of water also for 5-7 days.  He may take a  shower one day after he gets home.  He will follow up with Dr. Jenne Campus on  August 11, 2005, at 3 p.m.   DISCHARGE DIAGNOSES:  1.  Recurrent nonsustained ventricular tachycardia with a negative      electrophysiology study.  2.  Coronary artery disease with history of a coronary artery bypass      grafting, patent grafts this admission, status post cardiac  catheterization.  3.  Anxiety and stress.  4.  Hypertension.  5.  Hyperlipidemia.  6.  Sinus bradycardia.      Lezlie Octave, N.P.    ______________________________  Madaline Savage, M.D.    BB/MEDQ  D:  09/19/2005  T:  09/19/2005  Job:  [id number]   cc:   Duke Salvia, M.D.  [address]  [address]  [place]  [place] [address]   Titus Dubin. Alwyn Ren, M.D. [facility]  [phone] [place]  [place] [address]

## 2010-09-10 NOTE — Op Note (Signed)
NAME:  [name] [name], [name] [name]                     ACCOUNT NO.:  [id number]   MEDICAL RECORD NO.:  [id number]                   PATIENT TYPE:  INP   LOCATION:  2308                                 FACILITY:  [facility]   PHYSICIAN:  Gwenith Daily. Tyrone Sage, M.D.            DATE OF BIRTH:  [birth year]   DATE OF PROCEDURE:  03/07/2003  DATE OF DISCHARGE:                                 OPERATIVE REPORT   PREOPERATIVE DIAGNOSIS:  Unstable angina.   POSTOPERATIVE DIAGNOSIS:  Unstable angina.   SURGICAL PROCEDURE:  Coronary artery bypass grafting x5 with left internal  mammary to the left anterior descending coronary artery, reverse saphenous  vein graft to the diagonal coronary artery, reverse saphenous vein graft to  the circumflex coronary artery, sequential reverse saphenous vein graft to  the posterior descending and posterolateral branches of the right coronary  artery with endovein harvesting and placement of femoral arterial line.   SURGEON:  Gwenith Daily. Tyrone Sage, M.D.   FIRST ASSISTANT:  Toribio Harbour, R.N., first assistant and nurse  practitioner.   BRIEF HISTORY:  The patient is a 76 year old male who presented with  unstable anginal symptoms.  He underwent cardiac catheterization by Dr.  Tresa Endo, which demonstrated significant three-vessel disease, including total  occlusion of his LAD after the takeoff of a diagonal and a 90% LAD stenosis  prior to the takeoff of the diagonal.  In addition, he had an 80% circumflex  lesion.  He had a right coronary artery, which was patent but with greater  than 90% stenosis at the takeoff of the posterior descending and 60-70%  lesion involving the posterolateral branch.  Overall ventricular function  was preserved.  The patient had a preoperative echocardiogram that showed  estimated aortic valve area of greater than 2 and very mild aortic  insufficiency.  Coronary artery bypass grafting was recommended to the  patient, who agreed and signed the  informed consent.   DESCRIPTION OF THE PROCEDURE:  With Swan-Ganz arterial line monitors placed,  the patient underwent general endotracheal anesthesia without incident. The  skin of the chest and legs was prepped with Betadine and draped in the usual  sterile manner.  A vein was harvested endoscopically from the right thigh  and then also from the right part of the right lower leg.  The vein was  removed and was of good quality.  A median sternotomy was performed.  The  left internal mammary artery was dissected down to its pedicle graft.  The  distal artery was divided and had good __________.  The pericardium was  open.  Overall ventricular function appeared preserved.  The patient was  systemically heparinized.  The ascending aorta and the right atrium were  cannulated, and the aortic root had cardioplegia.  The needle was introduced  into the ascending aorta.  The aortic cross clamp was applied, and 500 mL of  cold blood potassium cardioplegia was administered with rapid diastolic  arrest of the heart.  Attention was turned first to the posterior descending  coronary artery, which was opened and admitted a 1.5 mm probe.  Using the  __________, side-to-side anastomosis was carried out with a second reverse  saphenous vein graft.  The distal extent of the same vein was then carried  to the posterolateral branch, which was opened and admitted a 1.5 mm probe.  Using a running 7-0 Prolene, distal anastomosis was performed.  Attention  was then turned to the circumflex coronary artery, which was partially  intramyocardial.  The vessel was dissected out and opened and admitted a 1.5  mm probe distally and proximally.  Using a running 7-0 Prolene, distal  anastomosis was performed with a segment of reversed saphenous vein graft.  In a similar fashion, the diagonal coronary artery was identified and opened  and admitted a 1.5 mm probe.  Using a running 7-0 Prolene, a segment of  reverse  saphenous vein graft was anastomosed to the diagonal coronary  artery.  Attention was then turned to the left anterior descending coronary  artery, which was opened in the distal third of the vessel and admitted a 1  mm probe distally and a 1.5 mm probe proximally.  Using a running 8-0  Prolene, the left internal mammary artery was anastomosed to the left  anterior descending coronary artery.  With release, the mammary artery was  briefly tested and seemed static.  The fascia was tacked to the epicardium.  The bulldog was replaced back on the mammary artery, and the three proximal  anastomoses were carried out with a cross clamp still in place.  Three punch  aortotomies were performed, and each of the three vein grafts were  anastomosed to the ascending aorta.  Air was evacuated from the grafts, and  the aortic cross clamp was removed with a total cross clamp time of 96  minutes.  The patient spontaneously converted to a sinus rhythm.  Two atrial  and two ventricular pacing wires were applied.  A graft marker was applied.  The left pleural tube and two mediastinal tubes were left in place.  The  patient was ventilated and weaned from cardiopulmonary bypass without  difficulty.  With the patient hemodynamically stable, the left pleural tube  and two mediastinal tubes were left in place.  The pericardium was  reapproximated.  The sternum was closed with a #6 stainless steel wire.  The  fascia was closed with interrupted 0 Vicryl, running 3-0 Vicryl in the  subcutaneous tissue, and 4-0 subcuticular stitch in the skin edges.  A dry  dressing was applied.  Sponge and needle count was reported as correct at  the completion of the procedure.  The patient tolerated the procedure  without obvious complication and was transferred to the surgical intensive  care unit for further postoperative care.  At the completion of bypass surgery, the patient's radial arterial line was not functioning, and the   right femoral arterial line was placed and functioned well.                                               Gwenith Daily Tyrone Sage, M.D.    Tyson Babinski  D:  03/08/2003  T:  03/08/2003  Job:  [id number]

## 2010-09-10 NOTE — Op Note (Signed)
NAMEFINNBAR, CEDILLOS NO.:  [id number]   MEDICAL RECORD NO.:  [id number]          PATIENT TYPE:  INP   LOCATION:  3701                         FACILITY:  [facility]   PHYSICIAN:  Doylene Canning. Ladona Ridgel, M.D.  DATE OF BIRTH:  [birth year]   DATE OF PROCEDURE:  07/27/2005  DATE OF DISCHARGE:                                 OPERATIVE REPORT   PROCEDURE PERFORMED:  Invasive electrophysiology study.   INDICATION:  Recurrent VT (nonsustained in a patient with ischemic heart  disease).   I. INTRODUCTION:  The patient is a 76 year old man who was admitted to the  hospital with palpitations and documented bursts of ventricular tachycardia.  He has known coronary disease, status post bypass surgery several years ago.  He underwent cardiac catheterization which demonstrated preserved LV  function (EF equals 50% with patent grafts).  The morphology of his VT  appeared to be a left bundle inferior axis VT consistent with RV outflow  tract VT, but not diagnostic of this.  Because of the implications for  treatment, that is, calcium blockers and beta blockers if he has RV outflow  tract VT versus antiarrhythmic drug plus or minus defibrillator if he were  to have a scar-related inducible VT, he is now referred for  electrophysiologic study.   II. PROCEDURE:  After informed consent was obtained, the patient was taken  to the diagnostic EP lab in the fasting state.  After the usual preparation  and draping, intravenous fentanyl and midazolam were given for sedation.  After the usual prep, a 5-French quadripolar catheter was inserted  percutaneously into the right femoral vein and advanced to the RV apex.  A 5-  French quadripolar catheter was inserted percutaneously into the right  femoral vein and advanced to the His bundle region.  A 5-French quadripolar  catheter was inserted percutaneously in the right femoral vein and advanced  to the right atrium.  After measurement of the basic  intervals, rapid  ventricular pacing was carried out from the RV apex, demonstrating V-A  dissociation at 600 msec.  Programmed ventricular stimulation was then  carried out from the RV apex as well as the RV outflow tract at basic drive  cycle lengths of 401 and 400 msec.  S1-S2, S1-S2-S3 and S1-S2-S3-S4  intervals were delivered with the S1-S2, S2-S3, and S3-S4 intervals stepwise  decreased down to ventricular refractoriness.  During programmed ventricular  stimulation, there was no inducible VT.   Rapid atrial pacing was carried out from the high right atrium at basic  drive cycle length of 027 msec and stepwise decreased down to 420 msec,  where A-V Wenckebach was observed.  During rapid atrial pacing, the P-R  interval was equal to the R-R interval and there was no inducible SVT.  Next, programmed atrial stimulation was carried out from the high right  atrium at basic drive cycle length of 253 msec.  The S1-S2 interval was  stepwise decreased down to 250 msec, where atrial refractoriness was  observed.  During programmed atrial stimulation, there were no A-H jumps;  there were, however, echo beats, but no inducible SVT.  At this point, the  catheters were removed, hemostasis was assured and the patient was returned  to his room in satisfactory condition.   III. COMPLICATIONS:  There were no immediate procedural complications.   IV. RESULTS:  A.  Baseline ECG:  Baseline ECG demonstrates sinus rhythm with  normal axis and intervals.  B.  Baseline intervals:  The H-V interval was 70 msec.  The QRS duration 125  msec.  C.  Rapid ventricular pacing:  Rapid ventricular pacing was carried out from  the RV apex at 600 msec, demonstrating V-A dissociation.  Additional rapid  ventricular pacing was carried out down to 300 msec, demonstrating no  inducible VT.  D.  Programmed ventricular stimulation:  Programmed ventricular stimulation  was carried out from the RV apex as well as the RV  outflow tract at basic  drive cycle length of 161 and 400 msec.  The S1-S2, S1-S2-S3, and S1-S2-S3-  S4 stimuli were delivered with the S1-S2, S2-S3 and S3-S4 intervals stepwise  decreased down to ventricular refractoriness.  During programmed ventricular  stimulation, there was no inducible VT.  E.  Rapid atrial pacing:  Rapid atrial pacing was carried out from the high  right atrium demonstrating an A-V Wenckebach cycle length of 420 msec.  During rapid atrial pacing, the P-R interval was equal to, but not greater  than the R-R interval and there was no inducible SVT.  F.  Programmed atrial stimulation:  Programmed atrial stimulation was  carried out from the high right atrium at basic drive cycle length of 096  msec.  The S1-S2 interval was stepwise decreased down to 250 msec, where  atrial refractoriness was observed.  During programmed atrial stimulation,  there were echo beats, but no A-H jumps and no inducible SVT.   V. CONCLUSION:  This study failed to result in inducible ventricular  tachycardia or supraventricular tachycardia in a patient with a history of  nonsustained ventricular tachycardia and coronary disease.  As a result, the  above study strongly suggests a diagnosis of right ventricular outflow tract  ventricular tachycardia and essentially rules out more malignant causes of  ventricular tachycardia.   RECOMMENDATION:  Recommendation will be up-titrate beta blockers and  initiate calcium channel blockers as needed for control of symptoms.           ______________________________  Doylene Canning. Ladona Ridgel, M.D.     GWT/MEDQ  D:  07/27/2005  T:  07/28/2005  Job:  [id number]   cc:   Madaline Savage, M.D.  Fax: [phone]   Darlin Priestly, MD  Fax: [phone]   Titus Dubin. Alwyn Ren, M.D. [facility]  [phone] [place]  [place] [address]

## 2010-09-10 NOTE — Cardiovascular Report (Signed)
NAME:  [name] [name], [name] [name]                     ACCOUNT NO.:  [id number]   MEDICAL RECORD NO.:  [id number]                   PATIENT TYPE:  INP   LOCATION:  2015                                 FACILITY:  [facility]   PHYSICIAN:  Darlin Priestly, M.D.             DATE OF BIRTH:  08-24-1934   DATE OF PROCEDURE:  03/04/2003  DATE OF DISCHARGE:                              CARDIAC CATHETERIZATION   PROCEDURES PERFORMED:  1. Left heart catheterization.  2. Coronary angiography.  3. Left ventriculogram.  4. Ascending aortography.   ATTENDING:  Darlin Priestly, M.D.   COMPLICATIONS:  None.   INDICATIONS:  [name] [name] is a 76 year old male with a history of remote  cardiac catheterization with no significant CAD, history of hypertension,  paroxysmal atrial fibrillation who was recently seen in the office with a  complaint of intermittent substernal chest pain and palpitations.  He  underwent stress testing on March 03, 2003 with anterior wall ischemia and  a high risk Cardiolite and subsequently admitted for cardiac catheterization  to define his coronary status.   DESCRIPTION OF OPERATION:  After giving informed written consent, patient  brought to the cardiac catheterization laboratory.  Right and left groin  shaved, prepped and draped in usual sterile fashion.  ECG monitor  established.  Using a modified Seldinger technique, a number 6-French  arterial sheath inserted in right femoral artery.  A 6-French diagnostic  catheter was then used to perform diagnostic angiography.  This reveals a  medium sized left main with no significant disease.  The LAD is a medium  sized vessel which gave rise to one diagonal branch and is totally occluded  in its mid portion.  There is diffuse calcification of the proximal LAD with  a 90% proximal lesion.  As previously stated, the LAD is totally occluded in  its mid segment.  The mid and apical LAD fill via right to left collaterals  from the  distal PDA and there appears to be 60% long lesion in the mid  portion of the LAD.   The first diagonal is a large vessel which bifurcates distally, has no  significant disease.   The left circumflex is a large vessel coursing the AV groove and gave rise  to two obtuse marginal branches.  The AV groove circumflex has a 60% ostial  and 90% proximal lesion.  First OM is a large vessel which bifurcates  distally, no significant disease.  The second OM is a small vessel with no  significant disease.   The right coronary artery is a large vessel which is dominant.  Gives rise  to both PDA/posterolateral branch.  There is 50% ostial, 40% distal mid, and  40% distal RCA stenosis.  The PDA is a medium sized vessel with a 95% ostial  lesion.  The PDA is a medium sized vessel with 40% proximal lesion.  As  previously stated, there are right to left collaterals  to the LAD.   Left ventriculogram revealed mildly depressed EF of 40-45% with  anterolateral hypokinesis and apical dyskinesis.   Ascending aortography reveals mild aortic regurgitation.   HEMODYNAMICS:  Systemic arterial pressure 129/69, LV systemic pressure  129/6, LVEDP 16.   CONCLUSION:  1. Significant three vessel coronary artery disease.  2. Mildly depressed left ventricular systolic function.  3. Mild aortic regurgitation.                                               Darlin Priestly, M.D.    RHM/MEDQ  D:  03/04/2003  T:  03/04/2003  Job:  [id number]

## 2010-09-13 ENCOUNTER — Encounter (HOSPITAL_COMMUNITY): Payer: Self-pay

## 2010-09-14 ENCOUNTER — Encounter (HOSPITAL_COMMUNITY): Payer: Self-pay

## 2010-09-16 ENCOUNTER — Encounter (HOSPITAL_COMMUNITY): Payer: Self-pay

## 2010-09-20 ENCOUNTER — Encounter (HOSPITAL_COMMUNITY): Payer: Self-pay

## 2010-09-21 ENCOUNTER — Encounter (HOSPITAL_COMMUNITY): Payer: Self-pay

## 2010-09-23 ENCOUNTER — Encounter (HOSPITAL_COMMUNITY): Payer: Self-pay

## 2010-09-27 ENCOUNTER — Encounter (HOSPITAL_COMMUNITY): Payer: Self-pay | Attending: Cardiovascular Disease

## 2010-09-27 DIAGNOSIS — Z951 Presence of aortocoronary bypass graft: Secondary | ICD-10-CM | POA: Insufficient documentation

## 2010-09-27 DIAGNOSIS — E785 Hyperlipidemia, unspecified: Secondary | ICD-10-CM | POA: Insufficient documentation

## 2010-09-27 DIAGNOSIS — Z87891 Personal history of nicotine dependence: Secondary | ICD-10-CM | POA: Insufficient documentation

## 2010-09-27 DIAGNOSIS — Z5189 Encounter for other specified aftercare: Secondary | ICD-10-CM | POA: Insufficient documentation

## 2010-09-27 DIAGNOSIS — I10 Essential (primary) hypertension: Secondary | ICD-10-CM | POA: Insufficient documentation

## 2010-09-27 DIAGNOSIS — I4891 Unspecified atrial fibrillation: Secondary | ICD-10-CM | POA: Insufficient documentation

## 2010-09-27 DIAGNOSIS — I2510 Atherosclerotic heart disease of native coronary artery without angina pectoris: Secondary | ICD-10-CM | POA: Insufficient documentation

## 2010-09-28 ENCOUNTER — Encounter: Payer: Self-pay | Admitting: Internal Medicine

## 2010-09-28 ENCOUNTER — Encounter (HOSPITAL_COMMUNITY): Payer: Self-pay

## 2010-09-30 ENCOUNTER — Encounter (HOSPITAL_COMMUNITY): Payer: Self-pay

## 2010-10-04 ENCOUNTER — Encounter (HOSPITAL_COMMUNITY): Payer: Self-pay

## 2010-10-05 ENCOUNTER — Encounter (HOSPITAL_COMMUNITY): Payer: Self-pay

## 2010-10-07 ENCOUNTER — Encounter (HOSPITAL_COMMUNITY): Payer: Self-pay

## 2010-10-11 ENCOUNTER — Encounter (HOSPITAL_COMMUNITY): Payer: Self-pay

## 2010-10-12 ENCOUNTER — Encounter (HOSPITAL_COMMUNITY): Payer: Self-pay

## 2010-10-14 ENCOUNTER — Encounter (HOSPITAL_COMMUNITY): Payer: Self-pay

## 2010-10-18 ENCOUNTER — Encounter (HOSPITAL_COMMUNITY): Payer: Self-pay

## 2010-10-19 ENCOUNTER — Encounter (HOSPITAL_COMMUNITY): Payer: Self-pay

## 2010-10-21 ENCOUNTER — Encounter (HOSPITAL_COMMUNITY): Payer: Self-pay

## 2010-10-25 ENCOUNTER — Encounter (HOSPITAL_COMMUNITY): Payer: Self-pay

## 2010-10-26 ENCOUNTER — Encounter (HOSPITAL_COMMUNITY): Payer: Self-pay

## 2010-10-28 ENCOUNTER — Encounter (HOSPITAL_COMMUNITY): Payer: Self-pay

## 2010-11-01 ENCOUNTER — Encounter (HOSPITAL_COMMUNITY): Payer: Self-pay

## 2010-11-02 ENCOUNTER — Encounter (HOSPITAL_COMMUNITY): Payer: Self-pay

## 2010-11-04 ENCOUNTER — Encounter (HOSPITAL_COMMUNITY): Payer: Self-pay

## 2010-11-08 ENCOUNTER — Encounter (HOSPITAL_COMMUNITY): Payer: Self-pay

## 2010-11-09 ENCOUNTER — Encounter (HOSPITAL_COMMUNITY): Payer: Self-pay

## 2010-11-11 ENCOUNTER — Encounter (HOSPITAL_COMMUNITY): Payer: Self-pay

## 2010-11-15 ENCOUNTER — Encounter (HOSPITAL_COMMUNITY): Payer: Self-pay

## 2010-11-16 ENCOUNTER — Encounter (HOSPITAL_COMMUNITY): Payer: Self-pay

## 2010-11-18 ENCOUNTER — Encounter (HOSPITAL_COMMUNITY): Payer: Self-pay

## 2010-11-22 ENCOUNTER — Encounter (HOSPITAL_COMMUNITY): Payer: Self-pay

## 2010-11-23 ENCOUNTER — Encounter (HOSPITAL_COMMUNITY): Payer: Self-pay

## 2010-11-25 ENCOUNTER — Encounter (HOSPITAL_COMMUNITY): Payer: Self-pay

## 2010-11-29 ENCOUNTER — Encounter (HOSPITAL_COMMUNITY): Payer: Self-pay

## 2010-11-30 ENCOUNTER — Encounter (HOSPITAL_COMMUNITY): Payer: Self-pay

## 2010-12-02 ENCOUNTER — Encounter (HOSPITAL_COMMUNITY): Payer: Self-pay

## 2010-12-06 ENCOUNTER — Encounter (HOSPITAL_COMMUNITY): Payer: Self-pay

## 2010-12-07 ENCOUNTER — Encounter (HOSPITAL_COMMUNITY): Payer: Self-pay

## 2010-12-09 ENCOUNTER — Encounter (HOSPITAL_COMMUNITY): Payer: Self-pay

## 2010-12-13 ENCOUNTER — Encounter (HOSPITAL_COMMUNITY): Payer: Self-pay

## 2010-12-14 ENCOUNTER — Encounter (HOSPITAL_COMMUNITY): Payer: Self-pay

## 2010-12-16 ENCOUNTER — Encounter (HOSPITAL_COMMUNITY): Payer: Self-pay

## 2010-12-20 ENCOUNTER — Encounter (HOSPITAL_COMMUNITY): Payer: Self-pay

## 2010-12-21 ENCOUNTER — Encounter (HOSPITAL_COMMUNITY): Payer: Self-pay

## 2010-12-23 ENCOUNTER — Encounter (HOSPITAL_COMMUNITY): Payer: Self-pay

## 2011-03-15 ENCOUNTER — Ambulatory Visit (INDEPENDENT_AMBULATORY_CARE_PROVIDER_SITE_OTHER): Payer: Medicare Other

## 2011-03-15 DIAGNOSIS — Z23 Encounter for immunization: Secondary | ICD-10-CM

## 2011-03-29 ENCOUNTER — Other Ambulatory Visit (HOSPITAL_COMMUNITY): Payer: Self-pay | Admitting: Cardiovascular Disease

## 2011-03-29 DIAGNOSIS — I4891 Unspecified atrial fibrillation: Secondary | ICD-10-CM

## 2011-04-06 ENCOUNTER — Ambulatory Visit (HOSPITAL_COMMUNITY)
Admission: RE | Admit: 2011-04-06 | Discharge: 2011-04-06 | Disposition: A | Discharge status: Home / Self Care | Payer: Medicare Other | Source: Ambulatory Visit | Attending: Cardiovascular Disease | Admitting: Cardiovascular Disease

## 2011-04-06 DIAGNOSIS — Z79899 Other long term (current) drug therapy: Secondary | ICD-10-CM | POA: Insufficient documentation

## 2011-04-06 DIAGNOSIS — I4891 Unspecified atrial fibrillation: Secondary | ICD-10-CM | POA: Insufficient documentation

## 2011-04-06 LAB — PULMONARY FUNCTION TEST

## 2011-04-29 ENCOUNTER — Encounter: Payer: Self-pay | Admitting: Internal Medicine

## 2011-04-29 DIAGNOSIS — I4891 Unspecified atrial fibrillation: Secondary | ICD-10-CM | POA: Diagnosis not present

## 2011-05-02 DIAGNOSIS — Z7901 Long term (current) use of anticoagulants: Secondary | ICD-10-CM | POA: Diagnosis not present

## 2011-05-02 DIAGNOSIS — H04129 Dry eye syndrome of unspecified lacrimal gland: Secondary | ICD-10-CM | POA: Diagnosis not present

## 2011-05-02 DIAGNOSIS — I4891 Unspecified atrial fibrillation: Secondary | ICD-10-CM | POA: Diagnosis not present

## 2011-05-30 DIAGNOSIS — H2510 Age-related nuclear cataract, unspecified eye: Secondary | ICD-10-CM | POA: Diagnosis not present

## 2011-05-30 DIAGNOSIS — Z961 Presence of intraocular lens: Secondary | ICD-10-CM | POA: Diagnosis not present

## 2011-06-13 DIAGNOSIS — I4891 Unspecified atrial fibrillation: Secondary | ICD-10-CM | POA: Diagnosis not present

## 2011-06-13 DIAGNOSIS — Z7901 Long term (current) use of anticoagulants: Secondary | ICD-10-CM | POA: Diagnosis not present

## 2011-07-21 ENCOUNTER — Ambulatory Visit: Payer: Medicare Other | Admitting: Internal Medicine

## 2011-07-21 DIAGNOSIS — I4891 Unspecified atrial fibrillation: Secondary | ICD-10-CM | POA: Diagnosis not present

## 2011-07-21 DIAGNOSIS — Z7901 Long term (current) use of anticoagulants: Secondary | ICD-10-CM | POA: Diagnosis not present

## 2011-07-27 ENCOUNTER — Encounter: Payer: Self-pay | Admitting: *Deleted

## 2011-07-29 ENCOUNTER — Encounter: Payer: Self-pay | Admitting: Internal Medicine

## 2011-07-29 ENCOUNTER — Ambulatory Visit (INDEPENDENT_AMBULATORY_CARE_PROVIDER_SITE_OTHER): Payer: Medicare Other | Admitting: Internal Medicine

## 2011-07-29 VITALS — BP 142/82 | HR 59 | Ht 73.0 in | Wt 209.0 lb

## 2011-07-29 DIAGNOSIS — I4729 Other ventricular tachycardia: Secondary | ICD-10-CM | POA: Insufficient documentation

## 2011-07-29 DIAGNOSIS — I472 Ventricular tachycardia: Secondary | ICD-10-CM | POA: Diagnosis not present

## 2011-07-29 DIAGNOSIS — R05 Cough: Secondary | ICD-10-CM

## 2011-07-29 DIAGNOSIS — I4891 Unspecified atrial fibrillation: Secondary | ICD-10-CM

## 2011-07-29 NOTE — Patient Instructions (Signed)
Your physician has requested that you have an echocardiogram. Echocardiography is a painless test that uses sound waves to create images of your heart. It provides your doctor with information about the size and shape of your heart and how well your heart's chambers and valves are working. This procedure takes approximately one hour. There are no restrictions for this procedure.   

## 2011-07-29 NOTE — Assessment & Plan Note (Addendum)
The patient carries a diagnosis of atrial fibrillation but I don't know if it has been recently documented. I think it is worth doing given the risks of anticoagulation.  The patient has been on amiodarone for ventricular tachycardia. He was not aware of the indication. It is reasonable and stop the amiodarone and see if he has recurrent arrhythmia.  As inferred above, the old records were reviewed extensively  The implications of nonsustained ventricular tachycardia potential treatment options for atrial fibrillation related to structure and size of a left sided cavities. We'll check a 2-D echo

## 2011-07-29 NOTE — Progress Notes (Signed)
CARDIOLOGY CONSULT NOTE  Patient ID: [name] [name], MRN: [id number], DOB/AGE: 09/05/1934 77 y.o. Admit date: (Not on file) Date of Consult: 07/29/2011  Primary Physician: [name] Mannan, MD, MD Primary Cardiologist: [name] [name]  Chief Complaint: 2nd opinion   HPI [name] [name] is a 77 y.o. male : Who comes in today looking for affirmation as to his cardiac care He has been followed at [facility] for many years, initially by Dr. Jenne [name] then by Dr. Elsie [name] followed by a series of others until most recently by Dr. Salena [name] with whom he has connected well.  He has a history of a variety of arrhythmias which he has called atrial fibrillation. At least on 2 occasions these have been demonstrably not the case. On the first 2007 he showed up in the office and was found to have nonsustained ventricular tachycardia. He underwent catheterization demonstrating modest cardiomyopathy with ejection fraction of 40-45% in the context of prior bypass surgery. He was seen by [name] [name] to undergo EPS and was found to have no inducible ventricular tachycardia. His VT morphology was suggestive of an RVOT origin.  Amiodarone was started at that time and has been maintained ever since.  He also has a rehabilitation in 2012 thinking he was in atrial fibrillation. He was put on a monitor and was told that he was not. He does not recall the term PVC. He has frequent palpitations and irregularities which is similar to the aforementioned rhythms identified as Not atrial fibrillation. He is also on warfarin. I should note that his first diagnosis of atrial fibrillation with 1982. He has never been cardioverted.  Thromboembolic risk factors include age, hypertension, vascular disease for a CHADS-VASc score of 3   He comes in today wanting to know whether he can stop his amiodarone because of the side effects.  His exercise tolerance is quite good he denies chest pain or shortness of breath. He has no prior neurovascular events of  which he is aware.  He also has a chronic nagging cough which was initially attributed ACE inhibitor therapy; it has persisted however on ARB therapy       Past Medical History  Diagnosis Date  . HTN (hypertension)   . Pleurisy   . A-fib       Surgical History:  Past Surgical History  Procedure Date  . Coronary artery bypass graft      Home Meds: Prior to Admission medications   Medication Sig Start Date End Date Taking? Authorizing Provider  amiodarone (PACERONE) 100 MG tablet Take 200 mg by mouth daily.    Yes Historical Provider, MD  aspirin EC 81 MG tablet Take 81 mg by mouth daily.   Yes Historical Provider, MD  butalbital-acetaminophen-caffeine (FIORICET, ESGIC) 50-325-40 MG per tablet Take 1 tablet by mouth as needed.   Yes Historical Provider, MD  cetirizine (ZYRTEC) 10 MG tablet Take 10 mg by mouth daily.   Yes Historical Provider, MD  cholecalciferol (VITAMIN D) 1000 UNITS tablet Take 1,000 Units by mouth daily.   Yes Historical Provider, MD  hydrochlorothiazide (MICROZIDE) 12.5 MG capsule Take 25 mg by mouth daily.    Yes Historical Provider, MD  losartan (COZAAR) 100 MG tablet Take 100 mg by mouth daily.   Yes Historical Provider, MD  metoprolol succinate (TOPROL-XL) 50 MG 24 hr tablet Take 50 mg by mouth daily. 25 in the am 25 in the pm   Yes Historical Provider, MD  omeprazole (PRILOSEC) 20 MG capsule Take 20 mg by mouth  daily.   Yes Historical Provider, MD  Polyethyl Glycol-Propyl Glycol (SYSTANE) 0.4-0.3 % GEL Apply to eye daily.   Yes Historical Provider, MD  pravastatin (PRAVACHOL) 80 MG tablet Take 80 mg by mouth daily.   Yes Historical Provider, MD  sodium chloride (MURO 128) 5 % ophthalmic ointment Place 1 drop into the right eye 4 (four) times daily.   Yes Historical Provider, MD  warfarin (COUMADIN) 5 MG tablet Take 5 mg by mouth daily.   Yes Historical Provider, MD     Allergies:  Allergies  Allergen Reactions  . Cephalexin     REACTION: RASH  .  Lisinopril     REACTION: COUGH    History   Social History  . Marital Status: Married    Spouse Name: N/A    Number of Children: N/A  . Years of Education: N/A   Occupational History  . Not on file.   Social History Main Topics  . Smoking status: Former Smoker    Quit date: 04/25/1980  . Smokeless tobacco: Not on file  . Alcohol Use: Not on file  . Drug Use: Not on file  . Sexually Active: Not on file   Other Topics Concern  . Not on file   Social History Narrative  . No narrative on file     No family history on file.   ROS:  Please see the history of present illness.     All other systems reviewed and negative.    Physical Exam: Blood pressure 142/82, pulse 59, height 6\' 1"  (1.854 m), weight 209 lb (94.802 kg). General: Well developed, well nourished male in no acute distress. Head: Normocephalic, atraumatic, sclera non-icteric, no xanthomas, nares are without discharge. Lymph Nodes:  none Neck: Negative for carotid bruits. JVD not elevated. Lungs: Clear bilaterally to auscultation without wheezes, rales, or rhonchi. Breathing is unlabored. Heart: RRR with S1 S2. No murmurs, rubs, +s4 Abdomen: Soft, non-tender, non-distended with normoactive bowel sounds. No hepatomegaly. No rebound/guarding. No obvious abdominal masses. Msk:  Strength and tone appear normal for age. Extremities: No clubbing or cyanosis. No edema.  Distal pedal pulses are 2+ and equal bilaterally. Skin: Warm and Dry Neuro: Alert and oriented X 3. CN III-XII intact Grossly normal sensory and motor function . Psych:  Responds to questions appropriately with a normal affect.       .  EKG: Sinus rhythm at 59 Interval 15/11/45 Anterior ST-T wave changes No old tracing   Assessment and Plan:  [name] [name]

## 2011-07-29 NOTE — Assessment & Plan Note (Signed)
As above. I would be inclined to keep him on his beta blocker and stop his amiodarone. We have to follow his blood pressure carefully

## 2011-07-29 NOTE — Assessment & Plan Note (Signed)
I wonder whether he might be in a very small subgroup of patients who have cough from ARB. We will undertake a two-week withdrawal trial realizing that his blood pressure may be an issue as it has in the past but today is reasonably controlled

## 2011-08-01 DIAGNOSIS — Z961 Presence of intraocular lens: Secondary | ICD-10-CM | POA: Diagnosis not present

## 2011-08-01 DIAGNOSIS — H2510 Age-related nuclear cataract, unspecified eye: Secondary | ICD-10-CM | POA: Diagnosis not present

## 2011-08-01 DIAGNOSIS — H18419 Arcus senilis, unspecified eye: Secondary | ICD-10-CM | POA: Diagnosis not present

## 2011-08-08 ENCOUNTER — Ambulatory Visit (HOSPITAL_COMMUNITY): Payer: Medicare Other | Attending: Internal Medicine

## 2011-08-08 ENCOUNTER — Other Ambulatory Visit: Payer: Self-pay

## 2011-08-08 DIAGNOSIS — I10 Essential (primary) hypertension: Secondary | ICD-10-CM | POA: Insufficient documentation

## 2011-08-08 DIAGNOSIS — Z87891 Personal history of nicotine dependence: Secondary | ICD-10-CM | POA: Diagnosis not present

## 2011-08-08 DIAGNOSIS — I4729 Other ventricular tachycardia: Secondary | ICD-10-CM | POA: Insufficient documentation

## 2011-08-08 DIAGNOSIS — I472 Ventricular tachycardia, unspecified: Secondary | ICD-10-CM | POA: Insufficient documentation

## 2011-08-08 DIAGNOSIS — R059 Cough, unspecified: Secondary | ICD-10-CM | POA: Diagnosis not present

## 2011-08-08 DIAGNOSIS — R609 Edema, unspecified: Secondary | ICD-10-CM | POA: Insufficient documentation

## 2011-08-08 DIAGNOSIS — I4891 Unspecified atrial fibrillation: Secondary | ICD-10-CM

## 2011-08-08 DIAGNOSIS — R05 Cough: Secondary | ICD-10-CM | POA: Insufficient documentation

## 2011-08-08 DIAGNOSIS — I359 Nonrheumatic aortic valve disorder, unspecified: Secondary | ICD-10-CM | POA: Insufficient documentation

## 2011-08-08 HISTORY — PX: TRANSTHORACIC ECHOCARDIOGRAM: SHX275

## 2011-08-12 ENCOUNTER — Encounter: Payer: Self-pay | Admitting: Family Medicine

## 2011-08-12 ENCOUNTER — Ambulatory Visit (INDEPENDENT_AMBULATORY_CARE_PROVIDER_SITE_OTHER): Payer: Medicare Other | Admitting: Family Medicine

## 2011-08-12 DIAGNOSIS — J309 Allergic rhinitis, unspecified: Secondary | ICD-10-CM | POA: Diagnosis not present

## 2011-08-12 MED ORDER — FLUTICASONE PROPIONATE 50 MCG/ACT NA SUSP: 2.0000 | Freq: Every day | NASAL | Status: DC

## 2011-08-12 MED ORDER — FEXOFENADINE HCL 180 MG PO TABS: 180.0000 mg | ORAL_TABLET | Freq: Every day | ORAL | Status: DC

## 2011-08-12 NOTE — Progress Notes (Signed)
SUBJECTIVE:  [name] [name] is a 77 y.o. male who complains of coryza, congestion, sneezing and itching in eyes, cough for 7 days. He denies a history of anorexia, chest pain, dizziness, fatigue, fevers, myalgias, shortness of breath, sweats, vomiting and weakness and denies a history of asthma. Patient denies smoke cigarettes.   Patient Active Problem List  Diagnoses  . FUNGAL DERMATITIS  . HYPHEMA  . HEARING LOSS, SUDDEN  . HYPERTENSION, ESSENTIAL NOS  . Atrial fibrillation  . OTHER ACUTE SINUSITIS  . RHINITIS  . PLEURISY  . CELLULITIS AND ABSCESS OF UNSPECIFIED SITE  . LOW BACK PAIN SYNDROME  . EDEMA  . SYMPTOM, HEADACHE  . COUGH  . ADVERSE DRUG REACTION  . Ventricular tachycardia, nonsustained   Past Medical History  Diagnosis Date  . HTN (hypertension)   . Pleurisy   . A-fib     by remote history but not recently documented  . Ventricular tachycardia, nonsustained     Noninducible at EP testing 2007  . Coronary artery disease     Prior CABG 2004, patent grafts 2007, ejection fraction 45%  . Cough    Past Surgical History  Procedure Date  . Coronary artery bypass graft   . Back surgery   . Femoral hernia repair   . Vocal cord nodules  with resection   . Arthroscopic knee surgery   . Tonsillectomy   . Cataract surgery    History  Substance Use Topics  . Smoking status: Former Smoker    Quit date: 04/25/1980  . Smokeless tobacco: Not on file  . Alcohol Use: Not on file   No family history on file. Allergies  Allergen Reactions  . Cephalexin     REACTION: RASH  . Lisinopril     REACTION: COUGH   Current Outpatient Prescriptions on File Prior to Visit  Medication Sig Dispense Refill  . amiodarone (PACERONE) 100 MG tablet Take 200 mg by mouth daily.       Marland Kitchen aspirin EC 81 MG tablet Take 81 mg by mouth daily.      . butalbital-acetaminophen-caffeine (FIORICET, ESGIC) 50-325-40 MG per tablet Take 1 tablet by mouth as needed.      . cetirizine (ZYRTEC)  10 MG tablet Take 10 mg by mouth daily.      . cholecalciferol (VITAMIN D) 1000 UNITS tablet Take 1,000 Units by mouth daily.      . hydrochlorothiazide (MICROZIDE) 12.5 MG capsule Take 25 mg by mouth daily.       Marland Kitchen losartan (COZAAR) 100 MG tablet Take 100 mg by mouth daily.      . metoprolol succinate (TOPROL-XL) 50 MG 24 hr tablet Take 50 mg by mouth daily. 25 in the am 25 in the pm      . omeprazole (PRILOSEC) 20 MG capsule Take 20 mg by mouth daily.      Bertram Gala Glycol-Propyl Glycol (SYSTANE) 0.4-0.3 % GEL Apply to eye daily.      . pravastatin (PRAVACHOL) 80 MG tablet Take 80 mg by mouth daily.      . sodium chloride (MURO 128) 5 % ophthalmic ointment Place 1 drop into the right eye 4 (four) times daily.      Marland Kitchen warfarin (COUMADIN) 5 MG tablet Take 5 mg by mouth daily.       The PMH, PSH, Social History, Family History, Medications, and allergies have been reviewed in [facility], and have been updated if relevant.  OBJECTIVE: BP 122/80  Pulse 60  Temp(Src)  98.8 F (37.1 C) (Oral)  Wt 207 lb (93.895 kg)  He appears well, vital signs are as noted. Ears normal.  Throat and pharynx normal.  Neck supple. No adenopathy in the neck. Nose is congested. Sinuses non tender. The chest is clear, without wheezes or rales.  ASSESSMENT:  viral upper respiratory illness and allergic rhinitis  PLAN: Allegra, Flonase. Symptomatic therapy suggested: push fluids, rest and return office visit prn if symptoms persist or worsen. Lack of antibiotic effectiveness discussed with him. Call or return to clinic prn if these symptoms worsen or fail to improve as anticipated.

## 2011-08-12 NOTE — Patient Instructions (Signed)
Start taking Allegra (NOT allegra D).   Drink lots of fluids.  Treat sympotmatically with Mucinex, nasal saline irrigation, and Tylenol/Ibuprofen.  Start flonase daily. Call me next week an update.

## 2011-08-16 ENCOUNTER — Telehealth: Payer: Self-pay

## 2011-08-16 MED ORDER — HYDROCOD POLST-CHLORPHEN POLST 10-8 MG/5ML PO LQCR: 5.0000 mL | Freq: Every evening | ORAL | Status: DC | PRN

## 2011-08-16 MED ORDER — DOXYCYCLINE HYCLATE 100 MG PO TABS: 100.0000 mg | ORAL_TABLET | Freq: Two times a day (BID) | ORAL | Status: DC

## 2011-08-16 MED ORDER — AZITHROMYCIN 250 MG PO TABS: ORAL_TABLET | ORAL | Status: AC

## 2011-08-16 MED ORDER — HYDROCODONE-HOMATROPINE 5-1.5 MG/5ML PO SYRP: 5.0000 mL | ORAL_SOLUTION | Freq: Three times a day (TID) | ORAL | Status: AC | PRN

## 2011-08-16 NOTE — Telephone Encounter (Signed)
Given cardiac history, will send rx for doxcycline 100 mg twice daily x 10 days instead of Zpack. Please call in rx for tussionex as entered below.

## 2011-08-16 NOTE — Telephone Encounter (Signed)
Ok to send rx for Zpack to his pharmacy. Cough can linger.  Would he like rx for cough medicine with codeine?

## 2011-08-16 NOTE — Telephone Encounter (Signed)
Hycodan called to pharmacy.  Left message advising pt as below.

## 2011-08-16 NOTE — Telephone Encounter (Signed)
Dr. Dayton Martes, pharmacist states that pt doesn't have any medicine coverage and doxycycline has gone way up in price due to some type of ingrediant shortage, and tussionex is very expensive also.  Pharmacist is asking that you change to less expensive abx and go with either hydromet or robitussin AC for cough.

## 2011-08-16 NOTE — Telephone Encounter (Signed)
Pt saw Dr Dayton Martes 08/12/11. Pt said no better and cough is worse; cannot sleep at night for non productive cough due to drainage at back of throat, still sneezing, S/T, feels cold but no fever, no SOB and no wheezing. Pt taking Allegra and flonase and drinking lots of fluids. Pt uses [facility] and can be reached at [phone].

## 2011-08-16 NOTE — Telephone Encounter (Signed)
Allergic to cephalexin so I would like to avoid all PCN related abx. He has tolerated ZPack in past, will go ahead and send in zpack- watch for arrythmias. Ok to wait this out--most likely viral and not pick up the zpack. Please call in Hydromet as entered below

## 2011-08-18 ENCOUNTER — Telehealth: Payer: Self-pay | Admitting: *Deleted

## 2011-08-18 NOTE — Telephone Encounter (Signed)
Pt called to report that he hycodan that he got made him very ill with nausea and vomiting, and he doesn't want that anymore

## 2011-08-18 NOTE — Telephone Encounter (Signed)
Noted.  I am sorry to hear that.  Please add to his allergy list as an intolerance.

## 2011-08-18 NOTE — Telephone Encounter (Signed)
Medicine added to allergy list.

## 2011-08-31 ENCOUNTER — Ambulatory Visit (INDEPENDENT_AMBULATORY_CARE_PROVIDER_SITE_OTHER): Payer: Medicare Other | Admitting: *Deleted

## 2011-08-31 DIAGNOSIS — I4891 Unspecified atrial fibrillation: Secondary | ICD-10-CM

## 2011-08-31 DIAGNOSIS — Z7901 Long term (current) use of anticoagulants: Secondary | ICD-10-CM

## 2011-08-31 LAB — POCT INR: INR: 1.7

## 2011-08-31 NOTE — Patient Instructions (Signed)
A full discussion of the nature of anticoagulants has been carried out.  A benefit risk analysis has been presented to the patient, so that they understand the justification for choosing anticoagulation at this time. The need for frequent and regular monitoring, precise dosage adjustment and compliance is stressed.  Side effects of potential bleeding are discussed.  The patient should avoid any OTC items containing aspirin or ibuprofen, and should avoid great swings in general diet.  Avoid alcohol consumption.  Call if any signs of abnormal bleeding. Call [phone] if needed.  

## 2011-09-07 ENCOUNTER — Ambulatory Visit (INDEPENDENT_AMBULATORY_CARE_PROVIDER_SITE_OTHER): Payer: Medicare Other | Admitting: *Deleted

## 2011-09-07 DIAGNOSIS — I4891 Unspecified atrial fibrillation: Secondary | ICD-10-CM

## 2011-09-07 DIAGNOSIS — Z7901 Long term (current) use of anticoagulants: Secondary | ICD-10-CM | POA: Diagnosis not present

## 2011-09-12 ENCOUNTER — Other Ambulatory Visit: Payer: Self-pay

## 2011-09-12 MED ORDER — WARFARIN SODIUM 5 MG PO TABS: ORAL_TABLET | ORAL | Status: DC

## 2011-09-13 ENCOUNTER — Telehealth: Payer: Self-pay | Admitting: *Deleted

## 2011-09-13 ENCOUNTER — Ambulatory Visit: Payer: Self-pay | Admitting: Internal Medicine

## 2011-09-13 DIAGNOSIS — I4891 Unspecified atrial fibrillation: Secondary | ICD-10-CM

## 2011-09-13 DIAGNOSIS — Z7901 Long term (current) use of anticoagulants: Secondary | ICD-10-CM

## 2011-09-13 NOTE — Telephone Encounter (Signed)
Message copied by Jeannine Kitten on Tue Sep 13, 2011  4:46 PM ------      Message from: Duke Salvia      Created: Mon Sep 12, 2011 10:18 PM      Regarding: RE:  [place]       yes      ----- Message -----         From: Jeannine Kitten, RN         Sent: 09/06/2011   4:49 PM           To: Duke Salvia, MD      Subject: RE:  Forinash                                          We are following the pt now since seen by you. Since we do not have documentation for AF may we discontinue Coumadin      ----- Message -----         From: Duke Salvia, MD         Sent: 09/06/2011   3:40 PM           To: Jeannine Kitten, RN      Subject: RE:  Hermenia Bers  I am not sure who is managing this   If it is them then its fine  If its [place] we still dont have documanetaion for his afib      steve      ----- Message -----         From: Jeannine Kitten, RN         Sent: 08/31/2011  11:26 AM           To: Duke Salvia, MD, Jefferey Pica, RN      Subject:  [place]                                              Pt seen in Coumadin clinic 08/31/2011 and states that [facility] reduced goal  INR  From 2.0 to 3.0 to 1.8 to 2.5 due to his having blood noted in sclera when started on Coumadin  Please advise       Thanks

## 2011-09-13 NOTE — Telephone Encounter (Signed)
See telephone note from 09/13/2011 Pt would like to speak with Dr Graciela Husbands Pt may be reached at [phone]

## 2011-09-13 NOTE — Telephone Encounter (Signed)
Talked with pt and informed [name] [name] that Dr Graciela Husbands had informed nurse that pt may discontinue Coumadin at this time and pt verbalized understanding . Informed pt that if he has any questions to call and talk with Dr Graciela Husbands

## 2011-09-14 ENCOUNTER — Encounter: Payer: Self-pay | Admitting: Internal Medicine

## 2011-09-14 NOTE — Telephone Encounter (Signed)
Spoke with pt and noted that he had MILD AS and LAE 45mm   But no documented atrial fibrilation  CHADSVASC score is 4 so anticoagulation would be appropriate  What we should do is do 30 AF autodetection monitor and plan to stop coumadin if this is neg

## 2011-09-15 ENCOUNTER — Telehealth: Payer: Self-pay

## 2011-09-15 NOTE — Telephone Encounter (Signed)
Order placed for event monitor. I will notify Windell Moulding the patient needs to be called about this.

## 2011-09-16 NOTE — Telephone Encounter (Signed)
Patient call back to make appt for monitor 

## 2011-09-20 ENCOUNTER — Telehealth: Payer: Self-pay | Admitting: Family Medicine

## 2011-09-20 ENCOUNTER — Encounter (INDEPENDENT_AMBULATORY_CARE_PROVIDER_SITE_OTHER): Payer: Medicare Other

## 2011-09-20 DIAGNOSIS — I4891 Unspecified atrial fibrillation: Secondary | ICD-10-CM | POA: Diagnosis not present

## 2011-09-20 NOTE — Telephone Encounter (Signed)
I like Dr. Jorja Loa.

## 2011-09-20 NOTE — Telephone Encounter (Signed)
Pt was wanting to know if you would recommend any of the dermatologist in [place] to him. He says he wanted to go to someone that you would recommend.

## 2011-09-20 NOTE — Telephone Encounter (Signed)
Advised pt's wife.  She says pt used to go to him, doesn't think pt wants to go back, but she's not sure why.  She will advise pt.

## 2011-09-29 ENCOUNTER — Encounter: Payer: Self-pay | Admitting: Internal Medicine

## 2011-09-30 ENCOUNTER — Telehealth: Payer: Self-pay | Admitting: Internal Medicine

## 2011-09-30 NOTE — Telephone Encounter (Signed)
I spoke with the patient. He is wearing his event monitor now. He states that since he has had his monitor on he has noticed his heart pounding at night. On 5/28 he states he was laying and felt jittery and nervous. He has been waking up with a tightness and tingling in his calves. He complains of feeling an irratic heart beat that comes and goes. He states he had no issues prior to his monitor being placed. He states he woke up on 6/5 and felt weak and nauseated. He reports that he went walking with his wife later that day for about 30 minutes and had low energy and very little SOB. He reports going to the [place] yesterday and that his BP was 133/73 and HR 54 (NSR). He [place] about a mile and felt leg tightness. He was wondering whether any of his symptoms were related to his heart rhythm. He states he called and spoke with Windell Moulding this morning and she did not see anything of concern on the monitor. I explained to the patient that noticing his heart pounding a little louder at night can be common. I explained I am not sure what his am nausea is related too unless he is a little hypoglycemic. I reviewed his strips and there is no arrythmia noted. There are only occasional PVC's. I explained to the patient that he may be more aware of his heart and what it is doing now that he has the monitor on. I have advised that he continue to wear his monitor and record events when he is having symptoms so we can rule out any type of abnormal heart rhythm. The patient voices understanding.

## 2011-09-30 NOTE — Telephone Encounter (Signed)
PT CALLING RE SYMPTOMS HE IS HAVING

## 2011-10-03 DIAGNOSIS — H2510 Age-related nuclear cataract, unspecified eye: Secondary | ICD-10-CM | POA: Diagnosis not present

## 2011-10-06 ENCOUNTER — Telehealth: Payer: Self-pay | Admitting: Internal Medicine

## 2011-10-06 DIAGNOSIS — I4891 Unspecified atrial fibrillation: Secondary | ICD-10-CM

## 2011-10-06 NOTE — Telephone Encounter (Signed)
Dr. Graciela Husbands reviewed the patient's monitor with him, which shows a-fib. He will have him restart warfarin with plans to follow up in the coumadin clinic in 1 week. He will also restart his amiodarone at 400 mg BID for 1 week, then 400 mg QD for 2 weeks, then 200 mg once daily. He will see him back in the office in 3-4 weeks. I will have scheduling contact the patient.

## 2011-10-06 NOTE — Telephone Encounter (Signed)
New msg Pt wants to talk to you about the monitor he is wearing. Please call

## 2011-10-06 NOTE — Telephone Encounter (Signed)
I spoke with the patient. He states he is wearing his monitor and did have some abnormal rhythms today. I explained to him that Windell Moulding did bring these around to me. I will review with Dr. Graciela Husbands. Looks like a-fib. I will call him back later today.

## 2011-10-07 ENCOUNTER — Telehealth: Payer: Self-pay | Admitting: Internal Medicine

## 2011-10-07 NOTE — Telephone Encounter (Signed)
New msg Pt wants to discuss his meds. He talked to Dr Graciela Husbands yesterday about starting a med. He needs to write it down. Please call

## 2011-10-07 NOTE — Telephone Encounter (Signed)
Meds reviewed and follow-up appts made with Dr. Graciela Husbands and the coumadin clinic.

## 2011-10-12 ENCOUNTER — Telehealth: Payer: Self-pay | Admitting: Internal Medicine

## 2011-10-12 NOTE — Telephone Encounter (Signed)
New msg Pt was calling about amiodarone. He needs to send a letter to [place] to say why he has resumed taking this med.

## 2011-10-12 NOTE — Telephone Encounter (Signed)
Patient would like for the office to fax a note to Dr. Sondra Come and Patty his nurse, stating the reason why Dr. Graciela Husbands resumed pt's  Amiodarone. The phone note  by Quentin Angst, on 10/06/11, which states the reason why pt had to resumed medication,  was faxed today, patient aware.

## 2011-10-14 ENCOUNTER — Ambulatory Visit (INDEPENDENT_AMBULATORY_CARE_PROVIDER_SITE_OTHER): Payer: Medicare Other | Admitting: Pharmacist

## 2011-10-14 DIAGNOSIS — I4891 Unspecified atrial fibrillation: Secondary | ICD-10-CM

## 2011-10-14 DIAGNOSIS — Z7901 Long term (current) use of anticoagulants: Secondary | ICD-10-CM | POA: Diagnosis not present

## 2011-10-26 ENCOUNTER — Ambulatory Visit (INDEPENDENT_AMBULATORY_CARE_PROVIDER_SITE_OTHER): Payer: Medicare Other | Admitting: Pharmacist

## 2011-10-26 ENCOUNTER — Ambulatory Visit (INDEPENDENT_AMBULATORY_CARE_PROVIDER_SITE_OTHER): Payer: Medicare Other | Admitting: Internal Medicine

## 2011-10-26 ENCOUNTER — Encounter: Payer: Self-pay | Admitting: Internal Medicine

## 2011-10-26 VITALS — BP 131/73 | HR 57 | Ht 72.0 in | Wt 202.0 lb

## 2011-10-26 DIAGNOSIS — R609 Edema, unspecified: Secondary | ICD-10-CM | POA: Diagnosis not present

## 2011-10-26 DIAGNOSIS — I4891 Unspecified atrial fibrillation: Secondary | ICD-10-CM

## 2011-10-26 DIAGNOSIS — Z7901 Long term (current) use of anticoagulants: Secondary | ICD-10-CM

## 2011-10-26 MED ORDER — FUROSEMIDE 20 MG PO TABS: ORAL_TABLET | ORAL | Status: DC

## 2011-10-26 NOTE — Assessment & Plan Note (Signed)
He is having no edema. We will change his hydrochlorothiazide to Lasix 20 mg every other day and check a metabolic profile in about 3 weeks

## 2011-10-26 NOTE — Progress Notes (Signed)
HPI  [name] [name] is a 77 y.o. male Seen in followup for atrial fibrillation and VT been treated with amiodarone. Thromboembolic risk factors are notable for a chads vas score of 3  He underwent monitoring after her initial visit to clarify the presence of atrial fibrillation. His prompting reinitiation of his warfarin and his amiodarone.  Echocardiogram demonstrated mild aortic stenosis and left atrial enlargement with normal left ventricular function  Catheterization in the year of about 2007 demonstrated patent grafts  He comes in now with a multitude of questions related to his atrial fibrillation which has been quiet or not he is back on amiodarone. He has no complaints of chest pain or shortness of breath. He does have some edema. He also has concerns about amiodarone and questions about catheter ablation and alternative antiarrhythmic drugs  Past Medical History  Diagnosis Date  . HTN (hypertension)   . Pleurisy   . A-fib     by remote history but not recently documented  . Ventricular tachycardia, nonsustained     Noninducible at EP testing 2007  . Coronary artery disease     Prior CABG 2004, patent grafts 2007, ejection fraction 45%  . Cough   . Aortic stenosis     mild -- echo 4.2013    Past Surgical History  Procedure Date  . Coronary artery bypass graft   . Back surgery   . Femoral hernia repair   . Vocal cord nodules  with resection   . Arthroscopic knee surgery   . Tonsillectomy   . Cataract surgery     Current Outpatient Prescriptions  Medication Sig Dispense Refill  . amiodarone (PACERONE) 200 MG tablet Take 200 mg by mouth.       Marland Kitchen aspirin EC 81 MG tablet Take 81 mg by mouth daily.      . butalbital-acetaminophen-caffeine (FIORICET, ESGIC) 50-325-40 MG per tablet Take 1 tablet by mouth as needed.      . cetirizine (ZYRTEC) 10 MG tablet Take 10 mg by mouth daily.      . chlorpheniramine-HYDROcodone (TUSSIONEX PENNKINETIC ER) 10-8 MG/5ML LQCR Take  5 mLs by mouth at bedtime as needed.  140 mL  0  . cholecalciferol (VITAMIN D) 1000 UNITS tablet Take 1,000 Units by mouth daily.      . fexofenadine (ALLEGRA) 180 MG tablet Take 1 tablet (180 mg total) by mouth daily.      . fluticasone (FLONASE) 50 MCG/ACT nasal spray Place 2 sprays into the nose daily.  16 g  6  . hydrochlorothiazide (MICROZIDE) 12.5 MG capsule Take 25 mg by mouth daily.       Marland Kitchen losartan (COZAAR) 100 MG tablet Take 100 mg by mouth daily.      . metoprolol succinate (TOPROL-XL) 50 MG 24 hr tablet Take 50 mg by mouth daily. 25 in the am 25 in the pm      . omeprazole (PRILOSEC) 20 MG capsule Take 20 mg by mouth daily.      Bertram Gala Glycol-Propyl Glycol (SYSTANE) 0.4-0.3 % GEL Apply to eye daily.      . pravastatin (PRAVACHOL) 80 MG tablet Take 80 mg by mouth daily.      . sodium chloride (MURO 128) 5 % ophthalmic ointment Place 1 drop into the right eye 4 (four) times daily.      Marland Kitchen warfarin (COUMADIN) 5 MG tablet Take as directed by anticoagulation clinic  40 tablet  3    Allergies  Allergen Reactions  .  Cephalexin     REACTION: RASH  . Hycodan (Hydrocodone-Homatropine) Nausea And Vomiting  . Lisinopril     REACTION: COUGH    Review of Systems negative except from HPI and PMH  Physical Exam BP 131/73  Pulse 57  Ht 6' (1.829 m)  Wt 202 lb (91.627 kg)  BMI 27.40 kg/m2 Well developed and well nourished in no acute distress HENT normal E scleral and icterus clear seen in followup for atrial arrhythmias. Neck Supple JVP flat; carotids brisk and full Clear to ausculation Regular rate and rhythm, no murmurs gallops or rub Soft with active bowel sounds No clubbing cyanosis none Edema Alert and oriented, grossly normal motor and sensory function Skin Warm and Dry    Assessment and  Plan

## 2011-10-26 NOTE — Patient Instructions (Signed)
Your physician has recommended you make the following change in your medication:  1) Stop HCTZ. 2) Start lasix (furosemide) 20 mg one tablet by mouth every other day.  You have been referred to : Dr. Johney Frame for evaluation for a-fib ablation.  Your physician recommends that you return for lab work in: 3 weeks- bmp  Your physician wants you to follow-up in: 4 months with Dr. Graciela Husbands. You will receive a reminder letter in the mail two months in advance. If you don't receive a letter, please call our office to schedule the follow-up appointment.

## 2011-10-26 NOTE — Assessment & Plan Note (Signed)
He has PVCs and is likely contributes to the pounding sensation that he is complaining of.

## 2011-10-26 NOTE — Assessment & Plan Note (Signed)
The patient has atrial fibrillation documented by the event recorder. It is now suppressed again on amiodarone. His electrocardiogram from April had a QTC of 460 ms precluding the use of any alternative class III agent. We also need, if he wanted to do that, to her amiodarone to wash out until the level is below 0.3. He is concerned about side effects.  As an alternative, he would like to consider catheter ablation. We'll schedule him to see Dr. Johney Frame. His left atrial dimension was about 48.Marland Kitchen

## 2011-11-09 ENCOUNTER — Ambulatory Visit (INDEPENDENT_AMBULATORY_CARE_PROVIDER_SITE_OTHER): Payer: Medicare Other | Admitting: *Deleted

## 2011-11-09 DIAGNOSIS — Z7901 Long term (current) use of anticoagulants: Secondary | ICD-10-CM

## 2011-11-09 DIAGNOSIS — I4891 Unspecified atrial fibrillation: Secondary | ICD-10-CM

## 2011-11-09 LAB — POCT INR: INR: 1.8

## 2011-11-10 DIAGNOSIS — H04129 Dry eye syndrome of unspecified lacrimal gland: Secondary | ICD-10-CM | POA: Diagnosis not present

## 2011-11-16 ENCOUNTER — Other Ambulatory Visit (INDEPENDENT_AMBULATORY_CARE_PROVIDER_SITE_OTHER): Payer: Medicare Other

## 2011-11-16 DIAGNOSIS — I4891 Unspecified atrial fibrillation: Secondary | ICD-10-CM

## 2011-11-16 LAB — BASIC METABOLIC PANEL
CO2: 29 mEq/L (ref 19–32)
Chloride: 103 mEq/L (ref 96–112)
Glucose, Bld: 111 mg/dL — ABNORMAL HIGH (ref 70–99)
Potassium: 3.1 mEq/L — ABNORMAL LOW (ref 3.5–5.1)
Sodium: 140 mEq/L (ref 135–145)

## 2011-11-17 ENCOUNTER — Other Ambulatory Visit: Payer: Self-pay

## 2011-11-17 DIAGNOSIS — E876 Hypokalemia: Secondary | ICD-10-CM

## 2011-11-17 MED ORDER — POTASSIUM CHLORIDE ER 10 MEQ PO TBCR: EXTENDED_RELEASE_TABLET | ORAL | Status: DC

## 2011-11-23 ENCOUNTER — Ambulatory Visit (INDEPENDENT_AMBULATORY_CARE_PROVIDER_SITE_OTHER): Payer: Medicare Other | Admitting: *Deleted

## 2011-11-23 ENCOUNTER — Other Ambulatory Visit (INDEPENDENT_AMBULATORY_CARE_PROVIDER_SITE_OTHER): Payer: Medicare Other

## 2011-11-23 DIAGNOSIS — Z7901 Long term (current) use of anticoagulants: Secondary | ICD-10-CM | POA: Diagnosis not present

## 2011-11-23 DIAGNOSIS — E876 Hypokalemia: Secondary | ICD-10-CM

## 2011-11-23 DIAGNOSIS — I4891 Unspecified atrial fibrillation: Secondary | ICD-10-CM

## 2011-11-23 LAB — BASIC METABOLIC PANEL
BUN: 19 mg/dL (ref 6–23)
Calcium: 9.1 mg/dL (ref 8.4–10.5)
GFR: 73.15 mL/min (ref >60.00)
Glucose, Bld: 94 mg/dL (ref 70–99)
Potassium: 3.5 mEq/L (ref 3.5–5.1)
Sodium: 139 mEq/L (ref 135–145)

## 2011-11-25 ENCOUNTER — Telehealth: Payer: Self-pay | Admitting: Internal Medicine

## 2011-11-25 DIAGNOSIS — I4891 Unspecified atrial fibrillation: Secondary | ICD-10-CM

## 2011-11-25 NOTE — Telephone Encounter (Signed)
New problem:  Patient calling for test results

## 2011-11-25 NOTE — Telephone Encounter (Signed)
Pt was given results of k+ 3.5 and told pt to continue to take K+ daily, told pt I will forward msg to RN to see when repeat labs are needed. Pt agreed to plan.

## 2011-11-28 DIAGNOSIS — S91309A Unspecified open wound, unspecified foot, initial encounter: Secondary | ICD-10-CM | POA: Diagnosis not present

## 2011-11-28 NOTE — Telephone Encounter (Signed)
Will forward to Dr. Graciela Husbands for review. Per 7/31 lab note, the patient stopped lasix after a few days due to leg pain and restarted HCTZ 25 mg once daily. He is currently taking this with K-dur 10 meq once daily. Last K+ 3.5.

## 2011-11-29 NOTE — Telephone Encounter (Signed)
Now is a good time

## 2011-11-30 ENCOUNTER — Encounter: Payer: Self-pay | Admitting: Internal Medicine

## 2011-11-30 ENCOUNTER — Ambulatory Visit (INDEPENDENT_AMBULATORY_CARE_PROVIDER_SITE_OTHER): Payer: Medicare Other | Admitting: Internal Medicine

## 2011-11-30 VITALS — BP 128/64 | HR 56 | Resp 18 | Ht 72.0 in | Wt 204.1 lb

## 2011-11-30 DIAGNOSIS — I2510 Atherosclerotic heart disease of native coronary artery without angina pectoris: Secondary | ICD-10-CM

## 2011-11-30 DIAGNOSIS — I10 Essential (primary) hypertension: Secondary | ICD-10-CM | POA: Diagnosis not present

## 2011-11-30 DIAGNOSIS — I4891 Unspecified atrial fibrillation: Secondary | ICD-10-CM | POA: Diagnosis not present

## 2011-11-30 NOTE — Assessment & Plan Note (Signed)
Stable No change required today  

## 2011-11-30 NOTE — Progress Notes (Signed)
Primary Care Physician: Ruthe Mannan, MD Referring Physician:  Dr Barbara Cower is a 77 y.o. male with a h/o paroxysmal atrial fibrillation who presents today for further consideration of afib ablation.  The patient reports initially being diagnosed with atrial fibrillation 1982.  He underwent CABG 2004 though I do not have access to the operative note presently to see if MAZE or LAA ligation were performed.  He has been treated chronically with amiodarone with reasonable results.  He recently developed concerns about amiodarone long term.  He therefore stopped amiodarone in April.  He developed recurrent symptoms of tachycardia and had an event monitor placed.  He reports palpitations and anxiety with afib.  This documented recurrent afib.  He was restarted on amiodarone.  He has been able to wean amiodarone to 100mg  daily and feels that his afib is presently controlled. Today, he denies symptoms of palpitations, chest pain, shortness of breath, orthopnea, PND, lower extremity edema, dizziness, presyncope, syncope, or neurologic sequela. The patient is tolerating medications without difficulties and is otherwise without complaint today.   Past Medical History  Diagnosis Date  . HTN (hypertension)   . Pleurisy   . Paroxysmal atrial fibrillation     by remote history but not recently documented  . Ventricular tachycardia, nonsustained     Noninducible at EP testing 2007  . Coronary artery disease     Prior CABG 2004, patent grafts 2007, ejection fraction 45%  . Cough   . Aortic stenosis     mild -- echo 4.2013   Past Surgical History  Procedure Date  . Coronary artery bypass graft   . Back surgery   . Femoral hernia repair   . Vocal cord nodules  with resection   . Arthroscopic knee surgery   . Tonsillectomy   . Cataract surgery     Current Outpatient Prescriptions  Medication Sig Dispense Refill  . amiodarone (PACERONE) 200 MG tablet Take 200 mg by mouth.       Marland Kitchen  aspirin EC 81 MG tablet Take 81 mg by mouth daily.      . butalbital-acetaminophen-caffeine (FIORICET, ESGIC) 50-325-40 MG per tablet Take 1 tablet by mouth as needed.      . cetirizine (ZYRTEC) 10 MG tablet Take 10 mg by mouth daily.      . cholecalciferol (VITAMIN D) 1000 UNITS tablet Take 2,000 Units by mouth daily.       . hydrochlorothiazide (HYDRODIURIL) 25 MG tablet Take 1 tablet (25 mg total) by mouth daily.  1 tablet  0  . losartan (COZAAR) 100 MG tablet Take 100 mg by mouth daily.      . metoprolol succinate (TOPROL-XL) 50 MG 24 hr tablet Take 50 mg by mouth daily. 25 in the am 25 in the pm      . potassium chloride (K-DUR) 10 MEQ tablet Take as directed  30 tablet  6  . pravastatin (PRAVACHOL) 80 MG tablet Take 80 mg by mouth daily.      . sodium chloride (MURO 128) 5 % ophthalmic ointment Place 1 drop into the right eye 4 (four) times daily.      Marland Kitchen warfarin (COUMADIN) 5 MG tablet Take as directed by anticoagulation clinic  40 tablet  3    Allergies  Allergen Reactions  . Cephalexin     REACTION: RASH  . Hycodan (Hydrocodone-Homatropine) Nausea And Vomiting  . Lisinopril     REACTION: COUGH    History   Social  History  . Marital Status: Married    Spouse Name: N/A    Number of Children: N/A  . Years of Education: N/A   Occupational History  . Not on file.   Social History Main Topics  . Smoking status: Former Smoker    Quit date: 04/25/1980  . Smokeless tobacco: Not on file  . Alcohol Use: No  . Drug Use: No  . Sexually Active: Not on file   Other Topics Concern  . Not on file   Social History Narrative  . No narrative on file    No family history on file.  ROS- All systems are reviewed and negative except as per the HPI above  Physical Exam: Filed Vitals:   11/30/11 0855  BP: 128/64  Pulse: 56  Resp: 18  Height: 6' (1.829 m)  Weight: 204 lb 1.9 oz (92.588 kg)  SpO2: 97%    GEN- The patient is well appearing, alert and oriented x 3 today.     Head- normocephalic, atraumatic Eyes-  Sclera clear, conjunctiva pink Ears- hearing intact Oropharynx- clear Neck- supple, no JVP Lymph- no cervical lymphadenopathy Lungs- Clear to ausculation bilaterally, normal work of breathing Heart- Regular rate and rhythm, no murmurs, rubs or gallops, PMI not laterally displaced GI- soft, NT, ND, + BS Extremities- no clubbing, cyanosis, or edema MS- no significant deformity or atrophy Skin- no rash or lesion Psych- euthymic mood, full affect Neuro- strength and sensation are intact  EKG today reveals sinus rhythm 56 bpm Qtc Event monitor reviewed  Assessment and Plan:

## 2011-11-30 NOTE — Assessment & Plan Note (Signed)
The patient has symptomatic paroxysmal atrial fibrillation.  Though presently his afib is controlled with amiodarone, he has concerns about using this medicine long term.  I have reviewed his recent monitor which documents afib.  In addition, he has a more organized arrhythmia at times which may also represent atrial flutter.  Presently, he is doing well with amiodarone 100mg  daily. Therapeutic strategies for afib including medicine and ablation were discussed in detail with the patient today. Risk, benefits, and alternatives to EP study and radiofrequency ablation for afib were also discussed in detail today. These risks include but are not limited to stroke, bleeding, vascular damage, tamponade, perforation, damage to the esophagus, lungs, and other structures, pulmonary vein stenosis, worsening renal function, and death. The patient understands these risk and wishes to further contemplate his options.  He appears to be leaning towards continuing amiodarone presently. He will contact my office if he wishes to proceed with ablation in the future.  He will continue to follow with Dr Graciela Husbands.

## 2011-12-01 NOTE — Telephone Encounter (Signed)
The patient is aware. He will come on 12/19/11 as this is best for him.

## 2011-12-01 NOTE — Addendum Note (Signed)
Addended by: Sherri Rad C on: 12/01/2011 03:46 PM   Modules accepted: Orders

## 2011-12-06 ENCOUNTER — Ambulatory Visit (INDEPENDENT_AMBULATORY_CARE_PROVIDER_SITE_OTHER): Payer: Medicare Other | Admitting: Family Medicine

## 2011-12-06 ENCOUNTER — Encounter: Payer: Self-pay | Admitting: Family Medicine

## 2011-12-06 VITALS — BP 130/70 | HR 69 | Temp 98.5°F | Wt 205.0 lb

## 2011-12-06 DIAGNOSIS — S91109A Unspecified open wound of unspecified toe(s) without damage to nail, initial encounter: Secondary | ICD-10-CM

## 2011-12-06 DIAGNOSIS — S91119A Laceration without foreign body of unspecified toe without damage to nail, initial encounter: Secondary | ICD-10-CM | POA: Insufficient documentation

## 2011-12-06 DIAGNOSIS — S91309A Unspecified open wound, unspecified foot, initial encounter: Secondary | ICD-10-CM | POA: Diagnosis not present

## 2011-12-06 NOTE — Assessment & Plan Note (Addendum)
No charge for MD exam.  Use neosporin and band aid, placed in post op shoe in meantime. F/u prn.

## 2011-12-06 NOTE — Patient Instructions (Addendum)
Keep the area clean and covered.  Wear the hard shoe.  If any spreading redness develops on the foot, then notify us.  Take care.

## 2011-12-06 NOTE — Progress Notes (Signed)
Misses a step 8 days ago and cut his L 3rd toe.  Was seen at [facility] and sutured.  Sutures removed today at the same outside clinic.  Here for eval of foot. No bleeding, discharge, pain. No spreading erythema.   Meds, vitals, and allergies reviewed.   ROS: See HPI.  Otherwise, noncontributory.  Nad L foot with normal inspection except for laceration on plantar side of 3rd toe and smaller lac on 4th toe. Neither are bleeding.  Fair tissue opposition. No discharge.  No tendon/motor deficit.

## 2011-12-16 ENCOUNTER — Other Ambulatory Visit: Payer: Medicare Other

## 2011-12-19 ENCOUNTER — Other Ambulatory Visit: Payer: Medicare Other

## 2011-12-19 ENCOUNTER — Other Ambulatory Visit (INDEPENDENT_AMBULATORY_CARE_PROVIDER_SITE_OTHER): Payer: Medicare Other

## 2011-12-19 ENCOUNTER — Ambulatory Visit (INDEPENDENT_AMBULATORY_CARE_PROVIDER_SITE_OTHER): Payer: Medicare Other | Admitting: *Deleted

## 2011-12-19 DIAGNOSIS — Z7901 Long term (current) use of anticoagulants: Secondary | ICD-10-CM

## 2011-12-19 DIAGNOSIS — I4891 Unspecified atrial fibrillation: Secondary | ICD-10-CM | POA: Diagnosis not present

## 2011-12-19 LAB — BASIC METABOLIC PANEL
BUN: 19 mg/dL (ref 6–23)
Calcium: 9.1 mg/dL (ref 8.4–10.5)
Creatinine, Ser: 1 mg/dL (ref 0.4–1.5)
GFR: 74.77 mL/min (ref >60.00)

## 2011-12-19 LAB — POCT INR: INR: 1.6

## 2012-01-02 ENCOUNTER — Telehealth: Payer: Self-pay | Admitting: Internal Medicine

## 2012-01-02 NOTE — Telephone Encounter (Signed)
Pt given results of BMET--asked if could d/c K+, now that K+ is back to normal--advised to continue to take K+  Until dr Graciela Husbands tells him to d/c--pt agrees

## 2012-01-02 NOTE — Telephone Encounter (Signed)
Pt rtn call re lab work, pls call [phone]

## 2012-01-04 ENCOUNTER — Telehealth: Payer: Self-pay | Admitting: Family Medicine

## 2012-01-04 NOTE — Telephone Encounter (Signed)
Okay with me if okay with Dr. Dayton Martes, will need next CPE scheduled with me.

## 2012-01-04 NOTE — Telephone Encounter (Signed)
Patient is asking if he can switch from Dr.Aron to you.  Patient said he saw you for a cut on his toe a couple weeks ago.  Patient said he likes Dr.Aron,but he would feel more comfortable with a male doctor.  Patient said his brother Hale Bogus and his wife Dot see you.  Can patient switch to you? Patient doesn't need an appointment now.

## 2012-01-04 NOTE — Telephone Encounter (Signed)
Yes ok with me.  He's a nice guy.

## 2012-01-09 ENCOUNTER — Ambulatory Visit (INDEPENDENT_AMBULATORY_CARE_PROVIDER_SITE_OTHER): Payer: Medicare Other | Admitting: *Deleted

## 2012-01-09 DIAGNOSIS — Z7901 Long term (current) use of anticoagulants: Secondary | ICD-10-CM

## 2012-01-09 DIAGNOSIS — I4891 Unspecified atrial fibrillation: Secondary | ICD-10-CM

## 2012-01-09 LAB — POCT INR: INR: 1.8

## 2012-01-19 ENCOUNTER — Encounter: Payer: Self-pay | Admitting: Family Medicine

## 2012-01-19 ENCOUNTER — Ambulatory Visit (INDEPENDENT_AMBULATORY_CARE_PROVIDER_SITE_OTHER): Payer: Medicare Other | Admitting: Family Medicine

## 2012-01-19 VITALS — BP 112/64 | HR 65 | Temp 98.2°F | Wt 207.0 lb

## 2012-01-19 DIAGNOSIS — R51 Headache: Secondary | ICD-10-CM

## 2012-01-19 DIAGNOSIS — R05 Cough: Secondary | ICD-10-CM

## 2012-01-19 DIAGNOSIS — M2669 Other specified disorders of temporomandibular joint: Secondary | ICD-10-CM | POA: Diagnosis not present

## 2012-01-19 DIAGNOSIS — Z23 Encounter for immunization: Secondary | ICD-10-CM | POA: Diagnosis not present

## 2012-01-19 DIAGNOSIS — M26629 Arthralgia of temporomandibular joint, unspecified side: Secondary | ICD-10-CM

## 2012-01-19 MED ORDER — FLUTICASONE PROPIONATE 50 MCG/ACT NA SUSP: 2.0000 | Freq: Every day | NASAL | Status: DC

## 2012-01-19 NOTE — Progress Notes (Signed)
R jaw pain for weeks to months.  Worse if/after chewing gum.  Pain radiates to R ear.  Not on L side.    Cough is chronic, slightly worse recently.  Was changed off ACE.  On ARB currently, but trial off ARB didn't help.  Occ with some sputum, this isn't a new issue for him.  No FCNAVD.  Prev treated for reflux with PPI w/o change.  H/o vocal cord polyp surgery.  He does have nasal congestion triggered with eating.    He'll get a HA around the R eye and temple triggered with certain foods- chocolate or spicy foods.  No aura.  Fioricet helps.  No vision changes.  No photophobia.  Going on for years.    Meds, vitals, and allergies reviewed.   ROS: See HPI.  Otherwise, noncontributory.  GEN: nad, alert and oriented HEENT: mucous membranes moist, tm wnl B, nasal and OP exam w/o acute changes R TMJ with crepitus and is ttp NECK: supple w/o LA CV: rrr PULM: ctab, no inc wob ABD: soft, +bs EXT: no edema SKIN: no acute rash CN 2-12 wnl B, S/S/DTR wnl x4

## 2012-01-19 NOTE — Patient Instructions (Signed)
I think you irritated your TMJ (your jaw joint).  Avoid tough foods, put an ice bag on the area, and take tylenol as needed.  If not improved, then talk to your dentist.  Use the flonase and see if that helps with the cough.  It will take a few weeks to makes a difference.  If not improved, then notify me.  Take care.  Glad to see you.

## 2012-01-20 DIAGNOSIS — M26629 Arthralgia of temporomandibular joint, unspecified side: Secondary | ICD-10-CM | POA: Insufficient documentation

## 2012-01-20 NOTE — Assessment & Plan Note (Signed)
Not resolved off ACE with trial off ARB.  Is on PPI.  No wheeze.  D/w pt re: ddx and would try flonase to see if treating postnasal gtt affect the sx.  He agrees. >25 min spent with face to face with patient, >50% counseling and/or coordinating care

## 2012-01-20 NOTE — Assessment & Plan Note (Signed)
Advised soft diet, jaw rest, local ice and f/u with dentist if not improved.  He may need dental appliance.

## 2012-01-20 NOTE — Assessment & Plan Note (Signed)
Not typical for migraine or cluster.  No alarming neuro sx and this is a chronic (unchanged) condition.  Continue with abortive tx and he'll notify me if worsening.  He agrees.

## 2012-02-06 ENCOUNTER — Ambulatory Visit (INDEPENDENT_AMBULATORY_CARE_PROVIDER_SITE_OTHER): Payer: Medicare Other | Admitting: *Deleted

## 2012-02-06 DIAGNOSIS — Z7901 Long term (current) use of anticoagulants: Secondary | ICD-10-CM

## 2012-02-06 DIAGNOSIS — I4891 Unspecified atrial fibrillation: Secondary | ICD-10-CM

## 2012-02-06 LAB — POCT INR: INR: 2.4

## 2012-03-05 ENCOUNTER — Other Ambulatory Visit: Payer: Self-pay | Admitting: *Deleted

## 2012-03-05 ENCOUNTER — Ambulatory Visit (INDEPENDENT_AMBULATORY_CARE_PROVIDER_SITE_OTHER): Payer: Medicare Other | Admitting: *Deleted

## 2012-03-05 DIAGNOSIS — I4891 Unspecified atrial fibrillation: Secondary | ICD-10-CM | POA: Diagnosis not present

## 2012-03-05 DIAGNOSIS — Z7901 Long term (current) use of anticoagulants: Secondary | ICD-10-CM | POA: Diagnosis not present

## 2012-03-05 MED ORDER — WARFARIN SODIUM 5 MG PO TABS: ORAL_TABLET | ORAL | Status: DC

## 2012-03-08 ENCOUNTER — Other Ambulatory Visit: Payer: Self-pay | Admitting: *Deleted

## 2012-03-08 DIAGNOSIS — E876 Hypokalemia: Secondary | ICD-10-CM

## 2012-03-08 MED ORDER — HYDROCHLOROTHIAZIDE 25 MG PO TABS: 25.0000 mg | ORAL_TABLET | Freq: Every day | ORAL | Status: DC

## 2012-03-14 ENCOUNTER — Ambulatory Visit (INDEPENDENT_AMBULATORY_CARE_PROVIDER_SITE_OTHER): Payer: Medicare Other | Admitting: *Deleted

## 2012-03-14 ENCOUNTER — Ambulatory Visit (INDEPENDENT_AMBULATORY_CARE_PROVIDER_SITE_OTHER): Payer: Medicare Other | Admitting: Internal Medicine

## 2012-03-14 ENCOUNTER — Encounter: Payer: Self-pay | Admitting: Internal Medicine

## 2012-03-14 VITALS — BP 140/72 | HR 56 | Ht 73.2 in | Wt 208.8 lb

## 2012-03-14 DIAGNOSIS — Z7901 Long term (current) use of anticoagulants: Secondary | ICD-10-CM | POA: Diagnosis not present

## 2012-03-14 DIAGNOSIS — I10 Essential (primary) hypertension: Secondary | ICD-10-CM | POA: Diagnosis not present

## 2012-03-14 DIAGNOSIS — I4891 Unspecified atrial fibrillation: Secondary | ICD-10-CM

## 2012-03-14 DIAGNOSIS — R05 Cough: Secondary | ICD-10-CM | POA: Diagnosis not present

## 2012-03-14 DIAGNOSIS — E785 Hyperlipidemia, unspecified: Secondary | ICD-10-CM | POA: Insufficient documentation

## 2012-03-14 LAB — COMPREHENSIVE METABOLIC PANEL
ALT: 17 U/L (ref 0–53)
BUN: 21 mg/dL (ref 6–23)
CO2: 31 mEq/L (ref 19–32)
Calcium: 9.2 mg/dL (ref 8.4–10.5)
Chloride: 102 mEq/L (ref 96–112)
Creatinine, Ser: 0.9 mg/dL (ref 0.4–1.5)
GFR: 83.04 mL/min (ref >60.00)
Total Bilirubin: 0.9 mg/dL (ref 0.3–1.2)

## 2012-03-14 LAB — TSH: TSH: 3.87 u[IU]/mL (ref 0.35–5.50)

## 2012-03-14 MED ORDER — AMIODARONE HCL 200 MG PO TABS: 100.0000 mg | ORAL_TABLET | Freq: Every day | ORAL | Status: DC

## 2012-03-14 NOTE — Progress Notes (Signed)
skf  HPI  [name] [name] is a 77 y.o. male Seen in followup for atrial fibrillation and VT been treated with amiodarone. Thromboembolic risk factors are notable for a chads vas score of 3  He underwent monitoring after  initial visit to clarify the presence of atrial fibrillation.This prompted reinitiation of his warfarin and his amiodarone.  Echocardiogram demonstrated mild aortic stenosis and left atrial enlargement with normal left ventricular function  Catheterization in the year of about 2007 demonstrated patent grafts Intercurrently he has seen Dr. Johney Frame to consider catheter ablation but decided that he would continue on amiodarone presently  He has had a problem with a cough. This has been worse in the last year. He has been on amiodarone for about 7 years. He also has a sensation of a fullness in his throat that sometimes "blows up like a balloon". It is not associated with the brackish taste.  He does have a history of pulmonary function testing done at [place].    Past Medical History  Diagnosis Date  . HTN (hypertension)   . Pleurisy   . Paroxysmal atrial fibrillation     by remote history but not recently documented  . Ventricular tachycardia, nonsustained     Noninducible at EP testing 2007  . Coronary artery disease     Prior CABG 2004, patent grafts 2007, ejection fraction 45%  . Cough   . Aortic stenosis     mild -- echo 4.2013    Past Surgical History  Procedure Date  . Coronary artery bypass graft   . Back surgery   . Femoral hernia repair   . Vocal cord nodules  with resection   . Arthroscopic knee surgery   . Tonsillectomy   . Cataract surgery     Current Outpatient Prescriptions  Medication Sig Dispense Refill  . amiodarone (PACERONE) 200 MG tablet Take 100 mg by mouth.       Marland Kitchen aspirin EC 81 MG tablet Take 81 mg by mouth daily.      . butalbital-acetaminophen-caffeine (FIORICET, ESGIC) 50-325-40 MG per tablet Take 1 tablet by mouth as  needed.      . cetirizine (ZYRTEC) 10 MG tablet Take 10 mg by mouth daily.      . cholecalciferol (VITAMIN D) 1000 UNITS tablet Take 2,000 Units by mouth daily.       . hydrochlorothiazide (HYDRODIURIL) 25 MG tablet Take 1 tablet (25 mg total) by mouth daily.  90 tablet  1  . losartan (COZAAR) 100 MG tablet Take 100 mg by mouth daily.      . metoprolol succinate (TOPROL-XL) 50 MG 24 hr tablet Take 50 mg by mouth daily. 25 in the am 25 in the pm      . potassium chloride (K-DUR) 10 MEQ tablet Take 10 mEq by mouth daily. Take as directed      . pravastatin (PRAVACHOL) 80 MG tablet Take 80 mg by mouth daily.      Marland Kitchen warfarin (COUMADIN) 5 MG tablet Take as directed by anticoagulation clinic  100 tablet  1  . [DISCONTINUED] potassium chloride (K-DUR) 10 MEQ tablet Take as directed  30 tablet  6    Allergies  Allergen Reactions  . Cephalexin     REACTION: RASH  . Hycodan (Hydrocodone-Homatropine) Nausea And Vomiting  . Lisinopril     REACTION: COUGH    Review of Systems negative except from HPI and PMH  Physical Exam BP 140/72  Pulse 56  Ht 6' 1.2" (  1.859 m)  Wt 208 lb 12.8 oz (94.711 kg)  BMI 27.40 kg/m2  SpO2 98% Well developed and well nourished in no acute distress HENT normal E scleral and icterus clear seen in followup for atrial arrhythmias. Neck Supple JVP flat; carotids brisk and full Clear to ausculation Regular rate and rhythm, no murmurs gallops or rub Soft with active bowel sounds No clubbing cyanosis none Edema Alert and oriented, grossly normal motor and sensory function Skin Warm and Dry  Electrocardiogram demonstrates sinus rhythm at 56 Intervals 16/12/46 Axis is left at -8 ST segment depression with T wave inversions in V2-V5 Inferior Q waves Assessment and  Plan

## 2012-03-14 NOTE — Assessment & Plan Note (Signed)
No recurrences of atrial fibrillation. Currently taking Coumadin. We discussed the NOACs. We will stop his aspirin.

## 2012-03-14 NOTE — Assessment & Plan Note (Signed)
Reasonably controlled 

## 2012-03-14 NOTE — Assessment & Plan Note (Signed)
He asked regarding his pravastatin. Framingham risk scores were estimated at 20%. He will call as was his cholesterol levels but I think ongoing therapy with statins is appropriate. We reviewed the drug interaction issues and they're relatively scant with pravastatin.

## 2012-03-14 NOTE — Assessment & Plan Note (Signed)
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 PFTs from 2012 had a DLCO of 87% making this less likely. We will repeat the PFTs.

## 2012-03-14 NOTE — Patient Instructions (Addendum)
LABS TODAY:  CMET & TSH  Stop Aspirin  Your physician wants you to follow-up in: 6 months with Dr. Graciela Husbands. You will receive a reminder letter in the mail two months in advance. If you don't receive a letter, please call our office to schedule the follow-up appointment.

## 2012-03-16 ENCOUNTER — Telehealth: Payer: Self-pay | Admitting: Internal Medicine

## 2012-03-16 NOTE — Telephone Encounter (Signed)
Pt has information for you

## 2012-03-19 ENCOUNTER — Other Ambulatory Visit: Payer: Self-pay | Admitting: *Deleted

## 2012-03-19 ENCOUNTER — Telehealth: Payer: Self-pay | Admitting: *Deleted

## 2012-03-19 DIAGNOSIS — R05 Cough: Secondary | ICD-10-CM

## 2012-03-19 NOTE — Telephone Encounter (Signed)
LMTCB

## 2012-03-19 NOTE — Telephone Encounter (Signed)
Pt called to report recent labs results by PCP:  Total Cholesterol - 179 Triglycerides - 82 HDL - 61 C-LDL - 102

## 2012-03-26 ENCOUNTER — Encounter (HOSPITAL_COMMUNITY): Payer: Medicare Other

## 2012-03-28 ENCOUNTER — Ambulatory Visit (HOSPITAL_COMMUNITY)
Admission: RE | Admit: 2012-03-28 | Discharge: 2012-03-28 | Disposition: A | Discharge status: Home / Self Care | Payer: Medicare Other | Source: Ambulatory Visit | Attending: Internal Medicine | Admitting: Internal Medicine

## 2012-03-28 ENCOUNTER — Ambulatory Visit (INDEPENDENT_AMBULATORY_CARE_PROVIDER_SITE_OTHER): Payer: Medicare Other | Admitting: *Deleted

## 2012-03-28 DIAGNOSIS — R059 Cough, unspecified: Secondary | ICD-10-CM | POA: Diagnosis not present

## 2012-03-28 DIAGNOSIS — Z7901 Long term (current) use of anticoagulants: Secondary | ICD-10-CM

## 2012-03-28 DIAGNOSIS — I4891 Unspecified atrial fibrillation: Secondary | ICD-10-CM | POA: Diagnosis not present

## 2012-03-28 DIAGNOSIS — Z87891 Personal history of nicotine dependence: Secondary | ICD-10-CM | POA: Diagnosis not present

## 2012-03-28 DIAGNOSIS — R05 Cough: Secondary | ICD-10-CM | POA: Diagnosis not present

## 2012-03-28 LAB — POCT INR: INR: 2.9

## 2012-03-28 LAB — PULMONARY FUNCTION TEST

## 2012-03-28 MED ORDER — ALBUTEROL SULFATE (5 MG/ML) 0.5% IN NEBU
2.5000 mg | INHALATION_SOLUTION | Freq: Once | RESPIRATORY_TRACT | Status: AC
  Administered 2012-03-28: 2.5 mg via RESPIRATORY_TRACT

## 2012-04-09 NOTE — Telephone Encounter (Signed)
Pt is currently taking pravastatin 80mg  daily.  Has taken other statins in the past and experienced joint and muscle pain as a result.  Pt does not want to try another statin at this time.  Will make Dr. Graciela Husbands aware.

## 2012-04-09 NOTE — Telephone Encounter (Signed)
His LDL is not at goal  I would recommend either atorva or resuvastation based on his insurance--ask him to look at costs or start crestor 10 tyhx

## 2012-04-27 ENCOUNTER — Ambulatory Visit (INDEPENDENT_AMBULATORY_CARE_PROVIDER_SITE_OTHER): Payer: Medicare Other

## 2012-04-27 DIAGNOSIS — Z7901 Long term (current) use of anticoagulants: Secondary | ICD-10-CM

## 2012-04-27 DIAGNOSIS — I4891 Unspecified atrial fibrillation: Secondary | ICD-10-CM | POA: Diagnosis not present

## 2012-04-30 ENCOUNTER — Telehealth: Payer: Self-pay | Admitting: Internal Medicine

## 2012-04-30 NOTE — Telephone Encounter (Signed)
New problem   Returning call back to [facility] regarding test.

## 2012-04-30 NOTE — Telephone Encounter (Signed)
Pt needs to be scheduled for an appointment for an Amiodarone discussion with Dr. Graciela Husbands.

## 2012-05-01 ENCOUNTER — Telehealth: Payer: Self-pay

## 2012-05-01 ENCOUNTER — Ambulatory Visit (INDEPENDENT_AMBULATORY_CARE_PROVIDER_SITE_OTHER): Payer: Medicare Other | Admitting: Family Medicine

## 2012-05-01 ENCOUNTER — Encounter: Payer: Self-pay | Admitting: Family Medicine

## 2012-05-01 ENCOUNTER — Telehealth: Payer: Self-pay | Admitting: Family Medicine

## 2012-05-01 VITALS — BP 136/72 | HR 54 | Temp 97.8°F | Wt 210.0 lb

## 2012-05-01 DIAGNOSIS — R05 Cough: Secondary | ICD-10-CM

## 2012-05-01 DIAGNOSIS — K219 Gastro-esophageal reflux disease without esophagitis: Secondary | ICD-10-CM | POA: Diagnosis not present

## 2012-05-01 MED ORDER — BENZONATATE 200 MG PO CAPS: 200.0000 mg | ORAL_CAPSULE | Freq: Three times a day (TID) | ORAL | Status: AC | PRN

## 2012-05-01 NOTE — Telephone Encounter (Signed)
Notify pt.  I sent note to [facility].  He should hear from [place].  If the amiodarone isn't related to the cough (and I will defer to Dr. Graciela Husbands on this), then we'll need to set him up with ENT about the cough/throat clearing.

## 2012-05-01 NOTE — Patient Instructions (Addendum)
Take tessalon for cough. Gargle with warm salt water for your throat.  We'll be in touch with Dr. Odessa Fleming office.  Let us know if you have other concerns.

## 2012-05-01 NOTE — Telephone Encounter (Signed)
Patient advised.

## 2012-05-01 NOTE — Telephone Encounter (Signed)
Pt was seen today and was to call back with clarification of mg of Omeprazole. Pt has Omeprazole 20 mg and takes 40 mg before bedtime.

## 2012-05-01 NOTE — Progress Notes (Signed)
He has both acute and chronic sx.    Chronic cough: He calls it a cough.  Wife calls it throat clearing.  Wife was wondering if this was food allergies.  This has been going on for years, worse in last 1-2 years per report.  Occ clear sputum and this is at baseline.  He was taken off ACE.  Trial off ARB didn't help.  On 40mg  prilosec.  He is currently on amiodarone and with noted h/o vocal cord polyp surgery years ago.  He had pulmonary testing done but he hasn't heard the results of that.   Acute cough.  Over the last 1-2 days, he's had hoarse voice, ST, continued cough/throat clearing.  Occ clear sputum and this is at baseline.  No wheeze.  No fevers.   Meds, vitals, and allergies reviewed.   ROS: See HPI.  Otherwise, noncontributory.  GEN: nad, alert and oriented HEENT: mucous membranes moist, tm w/o erythema, nasal exam w/o erythema, scant clear discharge noted,  OP with mild cobblestoning NECK: supple w/o LA CV: rrr.   PULM: ctab, no inc wob EXT: no edema SKIN: no acute rash

## 2012-05-01 NOTE — Telephone Encounter (Signed)
Noted. Thanks.

## 2012-05-01 NOTE — Assessment & Plan Note (Signed)
Likely with acute but self limited viral process.  Would use tessalon for this.  Supportive tx o/w and f/u prn.  Chronic cough- I noted the possible suggestion of mild abnormality on the prev PFTs but I would like Dr. Odessa Fleming input.  We'll ask the [facility] clinic to be in touch with the patient.  Should Dr. Graciela Husbands (and I appreciate input) not believe the amiodarone is contributing, then it would be reasonable to have pt see ENT as he hasn't responded to time off ARB and is on PPI.    >25 min spent with face to face with patient, >50% counseling and/or coordinating care.

## 2012-05-02 ENCOUNTER — Telehealth: Payer: Self-pay | Admitting: Internal Medicine

## 2012-05-02 NOTE — Telephone Encounter (Signed)
Discussed with Cory/ Dr. Graciela Husbands- PFT's ok- question if the patient's cough may be related to amiodarone toxicity. Per Dr. Graciela Husbands, schedule follow up appointment with him to discuss. The patient is aware and will see Dr. Graciela Husbands on 05/09/12.

## 2012-05-02 NOTE — Telephone Encounter (Signed)
Pt's wife said they having been waiting on a call from cory re results of test, said cory told them he would call them back two different times and they still haven't heard, wife very upset and want results asap

## 2012-05-03 ENCOUNTER — Ambulatory Visit (INDEPENDENT_AMBULATORY_CARE_PROVIDER_SITE_OTHER): Payer: Medicare Other | Admitting: Family Medicine

## 2012-05-03 ENCOUNTER — Encounter: Payer: Self-pay | Admitting: Family Medicine

## 2012-05-03 VITALS — BP 140/78 | HR 60 | Temp 98.5°F | Wt 213.8 lb

## 2012-05-03 DIAGNOSIS — R05 Cough: Secondary | ICD-10-CM

## 2012-05-03 MED ORDER — FLUTICASONE PROPIONATE 50 MCG/ACT NA SUSP: NASAL | Status: DC

## 2012-05-03 NOTE — Patient Instructions (Addendum)
If the cough continues after you see Dr. Graciela Husbands, let me know and we'll set you up with ENT.   Use the zyrtec at night and the flonase if needed.  Take care.

## 2012-05-03 NOTE — Progress Notes (Signed)
Since last OV, the acute/recent cough hasn't improved.  He's still had clear sputum/rhinorrhea.  No fevers.  He's taking tessalon in the meantime- he thinks it helps some.  Still with throat clearing.  Sleep is still disrupted from postnasal gtt and sneezing.  Already taking zyrtec in AM, unclear if that helps much.    Meds, vitals, and allergies reviewed.   ROS: See HPI.  Otherwise, noncontributory.  GEN: nad, alert and oriented  HEENT: mucous membranes moist, tm w/o erythema but R SOM noted, nasal exam w/o erythema, scant clear discharge noted, OP with mild cobblestoning  NECK: supple w/o LA  CV: rrr.  PULM: ctab, no inc wob  EXT: no edema  SKIN: no acute rash

## 2012-05-04 NOTE — Assessment & Plan Note (Signed)
Try zyrtec at night and then flonase if needed.  He'll notify me if the cough persists after seeing Dr. Graciela Husbands and we can set him up with ENT if needed.  He agrees with this.   I'll await update from patient.

## 2012-05-09 ENCOUNTER — Encounter: Payer: Self-pay | Admitting: Internal Medicine

## 2012-05-09 ENCOUNTER — Ambulatory Visit (INDEPENDENT_AMBULATORY_CARE_PROVIDER_SITE_OTHER): Payer: Medicare Other | Admitting: Internal Medicine

## 2012-05-09 ENCOUNTER — Other Ambulatory Visit: Payer: Self-pay | Admitting: Family Medicine

## 2012-05-09 VITALS — BP 140/78 | HR 62 | Ht 73.0 in | Wt 210.0 lb

## 2012-05-09 DIAGNOSIS — I4891 Unspecified atrial fibrillation: Secondary | ICD-10-CM | POA: Diagnosis not present

## 2012-05-09 DIAGNOSIS — R05 Cough: Secondary | ICD-10-CM

## 2012-05-09 DIAGNOSIS — R059 Cough, unspecified: Secondary | ICD-10-CM

## 2012-05-09 DIAGNOSIS — I10 Essential (primary) hypertension: Secondary | ICD-10-CM | POA: Diagnosis not present

## 2012-05-09 NOTE — Assessment & Plan Note (Signed)
He would like to simplify his blood pressure regime. We have reviewed the medication list. We will have an discontinue his p.m. Metoprolol.  A thought this occurs to me  Is that, while it is supposed to not occur, could the ocugh  be related to his ARB  Elimination trial might be worth it

## 2012-05-09 NOTE — Assessment & Plan Note (Signed)
The absence of shortness of breath makes this less concerned about amiodarone toxicity. The fact that the DLCO is are stable also mitigate some concern. I would agree however with Dr. Para March that coming to a better understanding is reasonable.  I would probably first try #llosartan elimination trial#2 ENT referral #3 pulmonary referral

## 2012-05-09 NOTE — Assessment & Plan Note (Signed)
No intercurrent atrial fibrillation. The patient would like to reduce his amiodarone dose so he will experiment taking it 3 days a week

## 2012-05-09 NOTE — Progress Notes (Signed)
[facility] Patient Care Team: Joaquim Nam, MD as PCP - General (Family Medicine)   HPI  [name] [name] is a 78 y.o. male Seen in followup for atrial fibrillation and VT been treated with amiodarone. Thromboembolic risk factors are notable for a chads vas score of 3  He underwent monitoring after initial visit to clarify the presence of atrial fibrillation.This prompted reinitiation of his warfarin and his amiodarone.  Echocardiogram demonstrated mild aortic stenosis and left atrial enlargement with normal left ventricular function  Catheterization in the year of about 2007 demonstrated patent grafts  Intercurrently he has seen Dr. Johney Frame to consider catheter ablation but decided that he would continue on amiodarone presently  He has had a problem with a cough. This has been worse in the last year. He has been on amiodarone for about 7 years. He also has a sensation of a fullness in his throat that sometimes "blows up like a balloon". It is not associated with the brackish taste.there have been no significant shortness of breath.  Pulmonary function testing has been done annually for thelast 3 years.DLCO's have been in sequence, 81, 87, and 77%.  Past Medical History  Diagnosis Date  . HTN (hypertension)   . Pleurisy   . Paroxysmal atrial fibrillation     by remote history but not recently documented  . Ventricular tachycardia, nonsustained     Noninducible at EP testing 2007  . Coronary artery disease     Prior CABG 2004, patent grafts 2007, ejection fraction 45%  . Cough   . Aortic stenosis     mild -- echo 4.2013    Past Surgical History  Procedure Date  . Coronary artery bypass graft   . Back surgery   . Femoral hernia repair   . Vocal cord nodules  with resection   . Arthroscopic knee surgery   . Tonsillectomy   . Cataract surgery     Current Outpatient Prescriptions  Medication Sig Dispense Refill  . amiodarone (PACERONE) 200 MG tablet Take 0.5 tablets (100 mg  total) by mouth daily.      . butalbital-acetaminophen-caffeine (FIORICET, ESGIC) 50-325-40 MG per tablet Take 1 tablet by mouth as needed.      . cetirizine (ZYRTEC) 10 MG tablet Take 10 mg by mouth daily.      . cholecalciferol (VITAMIN D) 1000 UNITS tablet Take 2,000 Units by mouth daily.       . fluticasone (FLONASE) 50 MCG/ACT nasal spray 2 sprays per nostril per day  16 g  1  . hydrochlorothiazide (HYDRODIURIL) 25 MG tablet Take 1 tablet (25 mg total) by mouth daily.  90 tablet  1  . losartan (COZAAR) 100 MG tablet Take 100 mg by mouth daily.      . metoprolol succinate (TOPROL-XL) 50 MG 24 hr tablet Take 50 mg by mouth daily. 25 in the am 25 in the pm      . omeprazole (PRILOSEC) 20 MG capsule Take 40 mg by mouth daily.       . potassium chloride (K-DUR) 10 MEQ tablet Take 10 mEq by mouth daily. Take as directed      . pravastatin (PRAVACHOL) 80 MG tablet Take 80 mg by mouth daily.      Marland Kitchen warfarin (COUMADIN) 5 MG tablet Take as directed by anticoagulation clinic  100 tablet  1    Allergies  Allergen Reactions  . Cephalexin     REACTION: RASH  . Hycodan (Hydrocodone-Homatropine) Nausea And Vomiting  .  Lisinopril     REACTION: COUGH    Review of Systems negative except from HPI and PMH  Physical Exam BP 140/78  Pulse 62  Ht 6\' 1"  (1.854 m)  Wt 210 lb (95.255 kg)  BMI 27.71 kg/m2  SpO2 97% Well developed and well nourished in no acute distress HENT normal E scleral and icterus clear Neck Supple JVP flat; carotids brisk and full Clear to ausculation  Regular rate and rhythm, no murmurs gallops or rub Soft with active bowel sounds No clubbing cyanosis none Edema Alert and oriented, grossly normal motor and sensory function Skin Warm and Dry    Assessment and  Plan

## 2012-05-10 ENCOUNTER — Telehealth: Payer: Self-pay

## 2012-05-10 NOTE — Telephone Encounter (Signed)
Shirlee Limerick has spoken with patient and appt has been set up.

## 2012-05-10 NOTE — Telephone Encounter (Signed)
Referral is already is.  [name] [name] is great.  Please notify pt and tell [name] [name].  Thanks.

## 2012-05-10 NOTE — Telephone Encounter (Signed)
Pt was seen twice last week by Dr Para March and saw Dr Graciela Husbands on 05/09/12; pt was to call back after Dr Odessa Fleming appt; pt is still coughing and wants to go ahead with ENT referral. Pt has seen Dr Allegra Grana at [facility] and if no objection from Dr Para March would like referral to see Dr Pollyann Kennedy. Pt request call back ASAP.

## 2012-05-11 DIAGNOSIS — R05 Cough: Secondary | ICD-10-CM | POA: Diagnosis not present

## 2012-05-11 DIAGNOSIS — K219 Gastro-esophageal reflux disease without esophagitis: Secondary | ICD-10-CM | POA: Diagnosis not present

## 2012-05-21 ENCOUNTER — Telehealth: Payer: Self-pay | Admitting: Internal Medicine

## 2012-05-21 DIAGNOSIS — E876 Hypokalemia: Secondary | ICD-10-CM

## 2012-05-21 NOTE — Telephone Encounter (Signed)
To [employer] and [place]

## 2012-05-21 NOTE — Telephone Encounter (Signed)
Pt needs potassium faxed to the [place] [phone] ATT: DR Sondra Come, needs to know why pt is taking the med in order to fill it, but its cheaper at the [facility] , pls call PT IF ANY QUESTIONS [phone]

## 2012-05-24 ENCOUNTER — Encounter: Payer: Self-pay | Admitting: *Deleted

## 2012-05-24 MED ORDER — POTASSIUM CHLORIDE ER 10 MEQ PO TBCR: 10.0000 meq | EXTENDED_RELEASE_TABLET | Freq: Every day | ORAL | Status: DC

## 2012-05-24 NOTE — Telephone Encounter (Signed)
RX request for potassium and letter faxed to Dr. Sondra Come at the [facility]. At [phone].

## 2012-05-25 ENCOUNTER — Ambulatory Visit (INDEPENDENT_AMBULATORY_CARE_PROVIDER_SITE_OTHER): Payer: Medicare Other | Admitting: *Deleted

## 2012-05-25 DIAGNOSIS — I4891 Unspecified atrial fibrillation: Secondary | ICD-10-CM | POA: Diagnosis not present

## 2012-05-25 DIAGNOSIS — Z7901 Long term (current) use of anticoagulants: Secondary | ICD-10-CM

## 2012-05-25 LAB — POCT INR: INR: 1.2

## 2012-06-01 ENCOUNTER — Telehealth: Payer: Self-pay | Admitting: Internal Medicine

## 2012-06-01 DIAGNOSIS — R05 Cough: Secondary | ICD-10-CM

## 2012-06-01 NOTE — Telephone Encounter (Signed)
I spoke with the patient. He states he saw Dr. Brynda Peon at [facility] ENT. He was told that as far as his persistant cough is concerned, that he looks ok. He stated that Dr. Graciela Husbands told him that he may need to look at his amiodarone as a cause of his cough. He would like to know what the options would be to amiodarone. He needs to be able to evaluate the cost and see if he could possibly get an alternative through the [place]. I will review with Dr. Graciela Husbands what the options would be and call the patient back next week. He is agreeable.

## 2012-06-01 NOTE — Telephone Encounter (Signed)
New Problem     Pt asks that you give him a call. He states it is not urgent, but would like to speak to you.

## 2012-06-04 NOTE — Telephone Encounter (Signed)
We may have to stop amio;; lets please make a pulmonary referral and then will make a decision re amio lung toxicity

## 2012-06-11 ENCOUNTER — Ambulatory Visit (INDEPENDENT_AMBULATORY_CARE_PROVIDER_SITE_OTHER): Payer: Medicare Other | Admitting: Pharmacist

## 2012-06-11 DIAGNOSIS — I4891 Unspecified atrial fibrillation: Secondary | ICD-10-CM | POA: Diagnosis not present

## 2012-06-11 DIAGNOSIS — Z7901 Long term (current) use of anticoagulants: Secondary | ICD-10-CM | POA: Diagnosis not present

## 2012-06-11 LAB — POCT INR: INR: 2.1

## 2012-06-11 NOTE — Telephone Encounter (Signed)
I spoke with the patient and he is aware of Dr. Odessa Fleming recommendations. Will order pulmonary referral and have the PCC's contact the patient.

## 2012-06-22 ENCOUNTER — Encounter: Payer: Self-pay | Admitting: Internal Medicine

## 2012-06-22 ENCOUNTER — Ambulatory Visit (INDEPENDENT_AMBULATORY_CARE_PROVIDER_SITE_OTHER): Payer: Medicare Other | Admitting: Internal Medicine

## 2012-06-22 VITALS — BP 140/82 | HR 76 | Temp 98.1°F | Ht 73.0 in | Wt 212.0 lb

## 2012-06-22 DIAGNOSIS — R05 Cough: Secondary | ICD-10-CM | POA: Diagnosis not present

## 2012-06-22 DIAGNOSIS — R059 Cough, unspecified: Secondary | ICD-10-CM

## 2012-06-22 NOTE — Progress Notes (Signed)
Subjective:    Patient ID: [name] [name], male    DOB: 06-Sep-1934 MRN: [id number]  HPI  48 yowm with urge to clear his throat dating back to the in 1960's underwent surgery in 1968 by [facility]as = "scraping for polyps" repeat ent eval by Pollyann Kennedy Jan 2014 neg x rx gerd.   06/22/2012  1st pulmonary eval/ [facility] cc min productive cough daily worse x one year not better since rx for acid reflux x  > one month varies to where is it does get better gut only for a few days at at time worse with chocalate or peanut better or pepper, better when distracted or sleeping.  No obvious assoc HB or sinus complaints (other than tickle he assoc with post nasal drip but has no assoc rhinorrea)  or assoc sob, chest tightness or wheeze.  No unusual exp hx, not better away from home.  In fact, Sleeping ok without nocturnal  or early am exacerbation  of respiratory  c/o's or need for noct saba. Also denies any obvious fluctuation of symptoms with weather or environmental changes or other aggravating or alleviating factors except as outlined above       Kouffman Reflux v Neurogenic Cough Differentiator Reflux Comments  Do you awaken from a sound sleep coughing violently?                            With trouble breathing? no   Do you have choking episodes when you cannot  Get enough air, gasping for air ?              no   Do you usually cough when you lie down into  The bed, or when you just lie down to rest ?                          some   Do you usually cough after meals or eating?         no   Do you cough when (or after) you bend over?    no   GERD SCORE     Kouffman Reflux v Neurogenic Cough Differentiator Neurogenic   Do you more-or-less cough all day long? no   Does change of temperature make you cough? no   Does laughing or chuckling cause you to cough? no   Do fumes (perfume, automobile fumes, burned  Toast, etc.,) cause you to cough ?      no   Does speaking, singing, or talking on the phone  cause you to cough   ?               Yes    Neurogenic/Airway score        Review of Systems  Constitutional: Negative for fever, chills, activity change, appetite change and unexpected weight change.  HENT: Positive for postnasal drip. Negative for congestion, sore throat, rhinorrhea, sneezing, trouble swallowing, dental problem and voice change.   Eyes: Negative for visual disturbance.  Respiratory: Positive for cough. Negative for choking and shortness of breath.   Cardiovascular: Negative for chest pain and leg swelling.  Gastrointestinal: Negative for nausea, vomiting and abdominal pain.  Genitourinary: Negative for difficulty urinating.  Musculoskeletal: Negative for arthralgias.  Skin: Negative for rash.  Psychiatric/Behavioral: Negative for behavioral problems and confusion.       Objective:   Physical Exam   Amb wm nad freq throat clearing Wt  Readings from Last 3 Encounters:  06/22/12 212 lb (96.163 kg)  05/09/12 210 lb (95.255 kg)  05/03/12 213 lb 12 oz (96.956 kg)    HEENT: nl dentition, turbinates, and orophanx. Ear canals blocked by hearing aids bilaterally   NECK :  without JVD/Nodes/TM/ nl carotid upstrokes bilaterally   LUNGS: no acc muscle use, clear to A and P bilaterally without cough on insp or exp maneuvers   CV:  RRR  no s3 or murmur or increase in P2, no edema   ABD:  soft and nontender with nl excursion in the supine position. No bruits or organomegaly, bowel sounds nl  MS:  warm without deformities, calf tenderness, cyanosis or clubbing  SKIN: warm and dry without lesions    NEURO:  alert, approp, no deficits    [facility]     Assessment & Plan:

## 2012-06-22 NOTE — Patient Instructions (Addendum)
Stay on prilosec (omeprazole) 40mg  Take 30-60 min before first meal of the day and Pepcid ac 20 mg one at bedtime  Stop zyrtec and chlortrimeton 4 mg one at bedtime and again as needed during the day as needed  GERD (REFLUX)  is an extremely common cause of respiratory symptoms, many times with no significant heartburn at all.    It can be treated with medication, but also with lifestyle changes including avoidance of late meals, excessive alcohol, smoking cessation, and avoid fatty foods, chocolate, peppermint, colas, red wine, and acidic juices such as orange juice.  NO MINT OR MENTHOL PRODUCTS SO NO COUGH DROPS  USE SUGARLESS CANDY INSTEAD (jolley ranchers or Stover's)  NO OIL BASED VITAMINS - use powdered substitutes.  Please schedule a follow up office visit in 4 weeks, sooner if needed

## 2012-06-23 NOTE — Assessment & Plan Note (Addendum)
The most common causes of chronic cough in immunocompetent adults include the following: upper airway cough syndrome (UACS), previously referred to as postnasal drip syndrome (PNDS), which is caused by variety of rhinosinus conditions; (2) asthma; (3) GERD; (4) chronic bronchitis from cigarette smoking or other inhaled environmental irritants; (5) nonasthmatic eosinophilic bronchitis; and (6) bronchiectasis.   These conditions, singly or in combination, have accounted for up to 94% of the causes of chronic cough in prospective studies.   Other conditions have constituted no >6% of the causes in prospective studies These have included bronchogenic carcinoma, chronic interstitial pneumonia, sarcoidosis, left ventricular failure, ACEI-induced cough, and aspiration from a condition associated with pharyngeal dysfunction.    Chronic cough is often simultaneously caused by more than one condition. A single cause has been found from 38 to 82% of the time, multiple causes from 18 to 62%. Multiply caused cough has been the result of three diseases up to 42% of the time.   No evidence at all for amiodarone toxicity here assoc with ILD so this is most likely a   Classic Upper airway cough syndrome, so named because it's frequently impossible to sort out how much is  CR/sinusitis with freq throat clearing (which can be related to primary GERD)   vs  causing  secondary (" extra esophageal")  GERD from wide swings in gastric pressure that occur with throat clearing, often  promoting self use of mint and menthol lozenges that reduce the lower esophageal sphincter tone and exacerbate the problem further in a cyclical fashion.   These are the same pts (now being labeled as having "irritable larynx syndrome" by some cough centers) who not infrequently have a history of having failed to tolerate ace inhibitors,  dry powder inhalers or biphosphonates or report having atypical reflux symptoms that don't respond to standard  doses of PPI , and are easily confused as having aecopd or asthma flares by even experienced allergists/ pulmonologists.   For now would like to try max acid suppression, gerd diet, and 1st gen H1 per guidelines with option to add neurontin and possibly even elavil at hs.

## 2012-06-27 ENCOUNTER — Ambulatory Visit (INDEPENDENT_AMBULATORY_CARE_PROVIDER_SITE_OTHER): Payer: Medicare Other | Admitting: Pharmacist

## 2012-06-27 DIAGNOSIS — I4891 Unspecified atrial fibrillation: Secondary | ICD-10-CM | POA: Diagnosis not present

## 2012-06-27 DIAGNOSIS — Z7901 Long term (current) use of anticoagulants: Secondary | ICD-10-CM | POA: Diagnosis not present

## 2012-07-09 ENCOUNTER — Encounter: Payer: Self-pay | Admitting: Family Medicine

## 2012-07-09 ENCOUNTER — Ambulatory Visit (INDEPENDENT_AMBULATORY_CARE_PROVIDER_SITE_OTHER): Payer: Medicare Other | Admitting: Family Medicine

## 2012-07-09 ENCOUNTER — Ambulatory Visit (INDEPENDENT_AMBULATORY_CARE_PROVIDER_SITE_OTHER)
Admission: RE | Admit: 2012-07-09 | Discharge: 2012-07-09 | Disposition: A | Discharge status: Home / Self Care | Payer: Medicare Other | Source: Ambulatory Visit | Attending: Family Medicine | Admitting: Family Medicine

## 2012-07-09 VITALS — BP 132/82 | HR 85 | Temp 98.0°F

## 2012-07-09 DIAGNOSIS — R0789 Other chest pain: Secondary | ICD-10-CM | POA: Insufficient documentation

## 2012-07-09 DIAGNOSIS — R071 Chest pain on breathing: Secondary | ICD-10-CM

## 2012-07-09 NOTE — Assessment & Plan Note (Signed)
Benign exam, cxr unremarkable for acute changes.  Reassured and f/u prn.  Likely due to the fall and no sig pathology found.  He'll f/u with pulmonary as scheduled.

## 2012-07-09 NOTE — Patient Instructions (Addendum)
Go to the lab on the way out.  We'll contact you with your xray report. 

## 2012-07-09 NOTE — Progress Notes (Signed)
He had some AM nausea and constipation after starting on chlorpheniramine and pepcid.  He stopped those meds and those sx have resolved.  He has f/u with pulmonary pending.    Fell yesterday AM.  Was cleaning up the yard and pulling/cutting limbs.  His legs got tangled up in some limbs.  Hit L side of chest on the ground.  No LOC.  Now pain has moved around to the front of the chest.  Not SOB.  Pain with coughing, blowing nose.  Not sleeping well on L side.  He was able to get up and keep working after the fall.  He's not taking anything for pain other than 1 aspirin yesterday.  No bruising, on coumadin.   Meds, vitals, and allergies reviewed.   ROS: See HPI.  Otherwise, noncontributory.  nad ncat Neck and shoulders with normal ROM L chest wall ttp diffusely but not bruised Sounds to be rrr ctab

## 2012-07-12 ENCOUNTER — Telehealth: Payer: Self-pay | Admitting: Internal Medicine

## 2012-07-20 ENCOUNTER — Encounter: Payer: Self-pay | Admitting: Internal Medicine

## 2012-07-20 ENCOUNTER — Telehealth: Payer: Self-pay | Admitting: Internal Medicine

## 2012-07-20 ENCOUNTER — Ambulatory Visit (INDEPENDENT_AMBULATORY_CARE_PROVIDER_SITE_OTHER): Payer: Medicare Other | Admitting: Internal Medicine

## 2012-07-20 VITALS — BP 116/74 | HR 70 | Temp 97.1°F | Ht 73.0 in | Wt 207.0 lb

## 2012-07-20 DIAGNOSIS — R05 Cough: Secondary | ICD-10-CM

## 2012-07-20 DIAGNOSIS — I10 Essential (primary) hypertension: Secondary | ICD-10-CM | POA: Diagnosis not present

## 2012-07-20 MED ORDER — PREDNISONE (PAK) 10 MG PO TABS: ORAL_TABLET | ORAL | Status: DC

## 2012-07-20 MED ORDER — OLMESARTAN MEDOXOMIL 20 MG PO TABS: 20.0000 mg | ORAL_TABLET | Freq: Every day | ORAL | Status: DC

## 2012-07-20 NOTE — Patient Instructions (Addendum)
Stop cozar (losartan)  Benicar 20 mg one half daily and if needed increase to one whole pill  If cough continues take Prednisone 10 mg take  4 each am x 2 days,   2 each am x 2 days,  1 each am x2days and stop   Please schedule a follow up office visit in 4 weeks, sooner if needed

## 2012-07-20 NOTE — Assessment & Plan Note (Addendum)
For now try benicar 20 mg one half daily x one month trial for ? Cozaar assoc cough (doubt)

## 2012-07-20 NOTE — Telephone Encounter (Signed)
It's probably the chlortrimeton causing the constipation and maybe the nausea so rec change back to zyrtec prn nose dripping

## 2012-07-20 NOTE — Assessment & Plan Note (Addendum)
Still strongly feel this is  Classic Upper airway cough syndrome, so named because it's frequently impossible to sort out how much is  CR/sinusitis with freq throat clearing (which can be related to primary GERD)   vs  causing  secondary (" extra esophageal")  GERD from wide swings in gastric pressure that occur with throat clearing, often  promoting self use of mint and menthol lozenges that reduce the lower esophageal sphincter tone and exacerbate the problem further in a cyclical fashion.   These are the same pts (now being labeled as having "irritable larynx syndrome" by some cough centers) who not infrequently have a history of having failed to tolerate ace inhibitors,  dry powder inhalers or biphosphonates or report having atypical reflux symptoms that don't respond to standard doses of PPI , and are easily confused as having aecopd or asthma flares by even experienced allergists/ pulmonologists.  Note there are reports of cozaar assoc with acei like cough but no other arb has this reported so rec trial of benicar  (see hbp)  In meantime improving on max gerd plus h1 rx so continue this and consider neurontin trial next ov

## 2012-07-20 NOTE — Telephone Encounter (Signed)
Left a detailed msg on the pt's machine with recs per MW per the pt's request

## 2012-07-20 NOTE — Telephone Encounter (Signed)
Spoke with pt He states that at ov today, he mentioned that either the pepcid or chlorpheniramine were causing nausea and constipation He thinks that he remembers MW saying that it was probably the pepcid, but unsure if he wanted him to continue on both of these meds Please advise thanks!

## 2012-07-20 NOTE — Progress Notes (Signed)
Subjective:    Patient ID: [name] [name], male    DOB: Mar 24, 1935 MRN: [id number]  HPI  67 yowm with urge to clear his throat dating back to the in 1960's underwent surgery in 1968 by [name] [name] = "scraping for polyps" and then bad cough on ACEI, better off it  Then recurrent cough 2013  repeat ent eval by [name] [name] Jan 2014 neg x rx gerd.   06/22/2012  1st pulmonary eval/ [name] [name] cc min productive cough daily worse x one year not better since rx for acid reflux x  > one month varies to where is it does get better but only for a few days at at time worse with chocalate or peanut better or pepper, better when distracted or sleeping.  rec Stay on prilosec (omeprazole) 40mg  Take 30-60 min before first meal of the day and Pepcid ac 20 mg one at bedtime Stop zyrtec and chlortrimeton 4 mg one at bedtime and again as needed during the day as needed GERD diet    07/20/2012 f/u ov/[name] [name] cc  Chief Complaint  Patient presents with  . 1 Month Follow Up    Cough has improved slightly.      No obvious daytime variabilty or assoc sob or cp or chest tightness, subjective wheeze overt sinus or hb symptoms. No unusual exp hx or h/o childhood pna/ asthma or premature birth to his knowledge.       .  In fact, Sleeping ok without nocturnal  or early am exacerbation  of respiratory  c/o's or need for noct saba. Also denies any obvious fluctuation of symptoms with weather or environmental changes or other aggravating or alleviating factors except as outlined above       Kouffman Reflux v Neurogenic Cough Differentiator Reflux Comments  Do you awaken from a sound sleep coughing violently?                            With trouble breathing? no   Do you have choking episodes when you cannot  Get enough air, gasping for air ?              no   Do you usually cough when you lie down into  The bed, or when you just lie down to rest ?                          some less  Do you usually cough after meals or  eating?         no   Do you cough when (or after) you bend over?    no   GERD SCORE     Kouffman Reflux v Neurogenic Cough Differentiator Neurogenic   Do you more-or-less cough all day long? no   Does change of temperature make you cough? no   Does laughing or chuckling cause you to cough? no   Do fumes (perfume, automobile fumes, burned  Toast, etc.,) cause you to cough ?      no   Does speaking, singing, or talking on the phone cause you to cough   ?               Yes  less  Neurogenic/Airway score        ROS  The following are not active complaints unless bolded sore throat, dysphagia, dental problems, itching, sneezing,  nasal congestion or excess/ purulent secretions, ear ache,  fever, chills, sweats, unintended wt loss, pleuritic or exertional cp, hemoptysis,  orthopnea pnd or leg swelling, presyncope, palpitations, heartburn, abdominal pain, anorexia, nausea, vomiting, diarrhea  or change in bowel or urinary habits, change in stools or urine, dysuria,hematuria,  rash, arthralgias, visual complaints, headache, numbness weakness or ataxia or problems with walking or coordination,  change in mood/affect or memory.          Objective:   Physical Exam   Amb wm nad no longer throat clearing   07/20/2012  Wt 207  Wt Readings from Last 3 Encounters:  06/22/12 212 lb (96.163 kg)  05/09/12 210 lb (95.255 kg)  05/03/12 213 lb 12 oz (96.956 kg)    HEENT: nl dentition, turbinates, and orophanx. Ear canals blocked by hearing aids bilaterally   NECK :  without JVD/Nodes/TM/ nl carotid upstrokes bilaterally   LUNGS: no acc muscle use, clear to A and P bilaterally without cough on insp or exp maneuvers   CV:  RRR  no s3 or murmur or increase in P2, no edema   ABD:  soft and nontender with nl excursion in the supine position. No bruits or organomegaly, bowel sounds nl  MS:  warm without deformities, calf tenderness, cyanosis or clubbing  SKIN: warm and dry without lesions         cxr 07/09/12 No active disease. Status post CABG. Minimal thoracic  dextroscoliosis      Assessment & Plan:

## 2012-07-23 ENCOUNTER — Telehealth: Payer: Self-pay | Admitting: Internal Medicine

## 2012-07-23 NOTE — Telephone Encounter (Signed)
Left a message for Westly Pam to call back.

## 2012-07-23 NOTE — Telephone Encounter (Signed)
New problem   Lacy from dr [facility] office-need clearance from dr Graciela Husbands to take pt off of coumadin for procedure on 10/18/12-written clearance can be faxed to: [phone] Attn. Westly Pam

## 2012-07-23 NOTE — Telephone Encounter (Signed)
Spoke with [name] [name] regarding pt needing a surgical clearance for a colonoscopy on 10/18/12 pt needs to be off coumadin prior the procedure with dr.Medoff. Pt was seen last on 05/09/12/ in Dr. Odessa Fleming clinic.

## 2012-07-24 NOTE — Telephone Encounter (Signed)
See attached

## 2012-07-24 NOTE — Telephone Encounter (Signed)
Will forward to Dr. Klein for review. 

## 2012-07-24 NOTE — Telephone Encounter (Signed)
If he is stable he can stop his coumadin as recommended by GI for his procedure. Risks are low

## 2012-07-25 ENCOUNTER — Ambulatory Visit (INDEPENDENT_AMBULATORY_CARE_PROVIDER_SITE_OTHER): Payer: Medicare Other | Admitting: *Deleted

## 2012-07-25 DIAGNOSIS — I4891 Unspecified atrial fibrillation: Secondary | ICD-10-CM | POA: Diagnosis not present

## 2012-07-25 DIAGNOSIS — Z7901 Long term (current) use of anticoagulants: Secondary | ICD-10-CM | POA: Diagnosis not present

## 2012-07-25 LAB — POCT INR: INR: 2.5

## 2012-07-25 NOTE — Telephone Encounter (Signed)
Message for pt 's clearance, faxed to Westly Pam to [phone] this AM; Westly Pam  is aware.

## 2012-07-30 ENCOUNTER — Telehealth: Payer: Self-pay

## 2012-07-30 DIAGNOSIS — L989 Disorder of the skin and subcutaneous tissue, unspecified: Secondary | ICD-10-CM

## 2012-07-30 NOTE — Telephone Encounter (Signed)
Pt having small sore that stings and burns at base of neck that will not heal for approx one year. Pt request dermatology referral; pt does not want to see [place] [facility] on [address].Please advise.

## 2012-07-31 ENCOUNTER — Encounter: Payer: Self-pay | Admitting: Family Medicine

## 2012-07-31 NOTE — Telephone Encounter (Signed)
Referral in is. Thanks.

## 2012-07-31 NOTE — Telephone Encounter (Signed)
Referral appt made with Dr Terri Piedra, patient aware.

## 2012-08-01 ENCOUNTER — Encounter: Payer: Self-pay | Admitting: Family Medicine

## 2012-08-01 ENCOUNTER — Ambulatory Visit: Payer: Medicare Other | Admitting: Family Medicine

## 2012-08-01 ENCOUNTER — Ambulatory Visit (INDEPENDENT_AMBULATORY_CARE_PROVIDER_SITE_OTHER): Payer: Medicare Other | Admitting: Family Medicine

## 2012-08-01 VITALS — BP 120/66 | HR 77 | Temp 97.6°F | Wt 206.0 lb

## 2012-08-01 DIAGNOSIS — R109 Unspecified abdominal pain: Secondary | ICD-10-CM

## 2012-08-01 NOTE — Progress Notes (Signed)
Cough- interval history- off benicar now- he was concerned about constipation and diarrhea from this medicine.  So he stopped it and started back on losartan. The cough hasn't been as bad lately.  He is on pepcid.   Complains of prev, episodic upper abd pain, just under the xiphoid.  Through to his back.  "It's like a cramp."  He could reposition in a chair to get some relief. Yesterday was better and he thought about cancelling the appointment. He is still some better today.  He has had these episodes a few times over the years.  Occ nausea but no vomiting.  He's had more gas and burping than normal.  He is generally normal with his BM frequency.  He was on chlortrimeton and pepcid and those are relatively new meds.  He stopped the chlortrimeton recently, with some improvement in the GI symptoms.    Prev colonoscopy with scattered diverticula and 1 polyp. No blood per rectum. No fevers.    Meds, vitals, and allergies reviewed.   ROS: See HPI.  Otherwise, noncontributory.  nad ncat Mmm rrr ctab abd soft, not ttp in any quadrant, no rebound, normal BS Ext w/o edema

## 2012-08-01 NOTE — Telephone Encounter (Signed)
appt has been added back at 4:15pm

## 2012-08-01 NOTE — Assessment & Plan Note (Signed)
Now apparently resolved, this could have been due to chlortrimeton- the timing is suggestive. Benign exam. He'll follow up prn and will stay on losartan for now. He agrees.  He'll keep fu with pulm re: cough.  Note to pulm clinic as FYI.

## 2012-08-01 NOTE — Patient Instructions (Signed)
I would stay off the chlortrimeton for now. Continue the losartan.  See if the abdominal symptoms get better. I would expect them to gradually resolve.

## 2012-08-02 ENCOUNTER — Telehealth: Payer: Self-pay

## 2012-08-02 NOTE — Telephone Encounter (Signed)
Patient advised.

## 2012-08-02 NOTE — Telephone Encounter (Signed)
Pt left v/m was seen 08/01/12 and forgot to tell Dr Para March; since pt seen 07/09/12 due to fall pt has taken # 90 regular strength Tylenol for pain. Pt wants to know if taking that many Tylenol could cause back and stomach issues pt saw Dr Para March with on 04/09.Please advise.Pt request call back.

## 2012-08-02 NOTE — Telephone Encounter (Signed)
That would be about 3-4 per day.  That shouldn't be a problem.

## 2012-08-12 ENCOUNTER — Other Ambulatory Visit: Payer: Self-pay | Admitting: Internal Medicine

## 2012-08-13 ENCOUNTER — Encounter: Payer: Self-pay | Admitting: Pharmacist

## 2012-08-13 ENCOUNTER — Other Ambulatory Visit: Payer: Self-pay | Admitting: Pharmacist

## 2012-08-13 MED ORDER — WARFARIN SODIUM 5 MG PO TABS: ORAL_TABLET | ORAL | Status: DC

## 2012-08-17 ENCOUNTER — Ambulatory Visit: Payer: Medicare Other | Admitting: Internal Medicine

## 2012-08-22 DIAGNOSIS — C4441 Basal cell carcinoma of skin of scalp and neck: Secondary | ICD-10-CM | POA: Diagnosis not present

## 2012-08-22 DIAGNOSIS — L570 Actinic keratosis: Secondary | ICD-10-CM | POA: Diagnosis not present

## 2012-08-22 DIAGNOSIS — D485 Neoplasm of uncertain behavior of skin: Secondary | ICD-10-CM | POA: Diagnosis not present

## 2012-08-23 ENCOUNTER — Encounter: Payer: Self-pay | Admitting: Family Medicine

## 2012-08-23 ENCOUNTER — Telehealth: Payer: Self-pay | Admitting: Family Medicine

## 2012-08-23 ENCOUNTER — Ambulatory Visit: Payer: Medicare Other | Admitting: Internal Medicine

## 2012-08-23 NOTE — Telephone Encounter (Signed)
Please call pt.  Prednisone could have caused this.  He needs eval (if SOB or CP, in ER).  If not feeling poorly and not tachycardic, then okay to add on tomorrow.

## 2012-08-23 NOTE — Telephone Encounter (Signed)
Appt scheduled

## 2012-08-24 ENCOUNTER — Ambulatory Visit (INDEPENDENT_AMBULATORY_CARE_PROVIDER_SITE_OTHER): Payer: Medicare Other | Admitting: Family Medicine

## 2012-08-24 ENCOUNTER — Encounter: Payer: Self-pay | Admitting: Family Medicine

## 2012-08-24 VITALS — BP 106/76 | HR 93 | Temp 98.0°F | Wt 205.5 lb

## 2012-08-24 DIAGNOSIS — R42 Dizziness and giddiness: Secondary | ICD-10-CM | POA: Diagnosis not present

## 2012-08-24 DIAGNOSIS — I4891 Unspecified atrial fibrillation: Secondary | ICD-10-CM

## 2012-08-24 DIAGNOSIS — R002 Palpitations: Secondary | ICD-10-CM | POA: Diagnosis not present

## 2012-08-24 NOTE — Assessment & Plan Note (Signed)
Resolving, no sx today.  Likely BPV, unlikely to be related to the AF.  Unclear if related to prednisone trial.  Anatomy d/w pt. No other intervention needed now. He agrees.  F/u prn.

## 2012-08-24 NOTE — Assessment & Plan Note (Addendum)
Back in AF on EKG (compared to prev), I d/w and I'll have him f/u with cards. He agrees.  I don't think the AF is causing the vertigo sx.  He has no CP or orthostatic sx.

## 2012-08-24 NOTE — Progress Notes (Signed)
Dizzy sensation, brief, room spinning, better if he would be still.  Started after the prednisone trial.  It is better this AM.  Last sx are yesterday.  Only with sx when he was moving, ie not with head still but eye tracking.   He thought he felt an irregular heartbeat recently.  He took an extra dose of amiodarone for the last few days and he is improved now. Known h/o AF. Still on coumadin. No chest pain.  Not SOB.   Meds, vitals, and allergies reviewed.   ROS: See HPI.  Otherwise, noncontributory.  GEN: nad, alert and oriented HEENT: mucous membranes moist, tm wnl, nasal and op exam  NECK: supple w/o LA CV: sounds to be rrr with ectopy noted PULM: ctab, no inc wob ABD: soft, +bs EXT: no edema SKIN: no acute rash CN 2-12 wnl B, S/S/DTR wnl x4 DHP neg

## 2012-08-24 NOTE — Patient Instructions (Addendum)
I would call cardiology about follow up as you appear to be back in AF.   The vertigo should gradually resolve.  If can happen epsidoically.

## 2012-08-27 ENCOUNTER — Encounter: Payer: Self-pay | Admitting: Adult Health

## 2012-08-27 ENCOUNTER — Ambulatory Visit (INDEPENDENT_AMBULATORY_CARE_PROVIDER_SITE_OTHER): Payer: Medicare Other | Admitting: Adult Health

## 2012-08-27 VITALS — BP 134/74 | HR 68 | Temp 97.8°F | Ht 72.0 in | Wt 207.0 lb

## 2012-08-27 DIAGNOSIS — R05 Cough: Secondary | ICD-10-CM | POA: Diagnosis not present

## 2012-08-27 NOTE — Progress Notes (Signed)
  Subjective:    Patient ID: [name] [name], male    DOB: 18-Jun-1934 MRN: [id number]  HPI 17 yowm with urge to clear his throat dating back to the in 1960's underwent surgery in 1968 by Patsovoras = "scraping for polyps" and then bad cough on ACEI, better off it  Then recurrent cough 2013  repeat ent eval by Pollyann Kennedy Jan 2014 neg x rx gerd.   06/22/2012  1st pulmonary eval/ Wert cc min productive cough daily worse x one year not better since rx for acid reflux x  > one month varies to where is it does get better but only for a few days at at time worse with chocalate or peanut better or pepper, better when distracted or sleeping.  rec Stay on prilosec (omeprazole) 40mg  Take 30-60 min before first meal of the day and Pepcid ac 20 mg one at bedtime Stop zyrtec and chlortrimeton 4 mg one at bedtime and again as needed during the day as needed GERD diet    07/20/2012 f/u ov/Wert cc  Chief Complaint  Patient presents with  . 1 Month Follow Up    Cough has improved slightly.     >>pred taper if cough continued   08/27/2012 Follow up  4 week follow up cough - reports cough is improved since last ov; states coughs only 2-3 days "little to none".  PCP suggested DC'd benicar d/t abd/back pain and restarted the losartan x3weeks Did have to take steroid taper couple of weeks ago.  Feels  No chest pain, edema, n/v.  Was seen by PCP recently found to have Atrial Fib -referred to  Unable to afford Benicar -switched back to Losartan , also felt it causing lower abd pains.  PCP stopped chlortrimeton due ? Side effects. Restarted Zyrtec.   ROS Neg except HPI     Objective:   Physical Exam   Amb wm nad     HEENT: nl dentition, turbinates, and orophanx. Ear canals blocked by hearing aids bilaterally   NECK :  without JVD/Nodes/TM/ nl carotid upstrokes bilaterally   LUNGS: no acc muscle use, clear to A and P bilaterally without cough on insp or exp maneuvers   CV:  RRR  no s3 or murmur or  increase in P2, no edema   ABD:  soft and nontender with nl excursion in the supine position. No bruits or organomegaly, bowel sounds nl  MS:  warm without deformities, calf tenderness, cyanosis or clubbing  SKIN: warm and dry without lesions        cxr 07/09/12 No active disease. Status post CABG. Minimal thoracic  dextroscoliosis      Assessment & Plan:

## 2012-08-27 NOTE — Assessment & Plan Note (Signed)
Improved on present regimen  follow up with pulmonary As needed

## 2012-08-27 NOTE — Patient Instructions (Addendum)
Continue on current regimen  follow up with your Dr. Para March as planned  Can follow back with Pulmonary as needed.

## 2012-08-29 ENCOUNTER — Encounter: Payer: Self-pay | Admitting: Family Medicine

## 2012-08-29 DIAGNOSIS — C4491 Basal cell carcinoma of skin, unspecified: Secondary | ICD-10-CM | POA: Insufficient documentation

## 2012-09-05 ENCOUNTER — Ambulatory Visit (INDEPENDENT_AMBULATORY_CARE_PROVIDER_SITE_OTHER): Payer: Medicare Other | Admitting: *Deleted

## 2012-09-05 DIAGNOSIS — I4891 Unspecified atrial fibrillation: Secondary | ICD-10-CM

## 2012-09-05 DIAGNOSIS — Z7901 Long term (current) use of anticoagulants: Secondary | ICD-10-CM

## 2012-09-05 LAB — POCT INR: INR: 2.1

## 2012-09-10 ENCOUNTER — Telehealth: Payer: Self-pay | Admitting: *Deleted

## 2012-09-10 NOTE — Telephone Encounter (Signed)
Message copied by Raul Del on Mon Sep 10, 2012 11:42 AM ------      Message from: Duke Salvia      Created: Sun Sep 09, 2012 12:38 PM      Regarding: RE: Pending Procedure       yes      ----- Message -----         From: Raul Del, RN         Sent: 09/05/2012   8:43 AM           To: Levon Hedger, [place], Duke Salvia, MD      Subject: Pending Procedure                                        Pt having Colonoscopy on 10/18/12 with Dr Kinnie Scales needs to be off 7 days prior. Is he cleared?        ------

## 2012-09-10 NOTE — Telephone Encounter (Signed)
Pt aware to stop coumadin on 10/10/12 per Dr Graciela Husbands for colonoscopy on 10/18/12 without bridging.

## 2012-09-18 ENCOUNTER — Encounter: Payer: Self-pay | Admitting: Family Medicine

## 2012-10-17 DIAGNOSIS — Z5181 Encounter for therapeutic drug level monitoring: Secondary | ICD-10-CM | POA: Diagnosis not present

## 2012-10-17 DIAGNOSIS — Z7901 Long term (current) use of anticoagulants: Secondary | ICD-10-CM | POA: Diagnosis not present

## 2012-10-18 DIAGNOSIS — D126 Benign neoplasm of colon, unspecified: Secondary | ICD-10-CM | POA: Diagnosis not present

## 2012-10-18 DIAGNOSIS — K648 Other hemorrhoids: Secondary | ICD-10-CM | POA: Diagnosis not present

## 2012-10-18 DIAGNOSIS — K5730 Diverticulosis of large intestine without perforation or abscess without bleeding: Secondary | ICD-10-CM | POA: Diagnosis not present

## 2012-10-18 DIAGNOSIS — F959 Tic disorder, unspecified: Secondary | ICD-10-CM | POA: Diagnosis not present

## 2012-10-18 DIAGNOSIS — Z86010 Personal history of colonic polyps: Secondary | ICD-10-CM | POA: Diagnosis not present

## 2012-10-25 ENCOUNTER — Ambulatory Visit (INDEPENDENT_AMBULATORY_CARE_PROVIDER_SITE_OTHER): Payer: Medicare Other | Admitting: *Deleted

## 2012-10-25 DIAGNOSIS — I4891 Unspecified atrial fibrillation: Secondary | ICD-10-CM | POA: Diagnosis not present

## 2012-10-25 DIAGNOSIS — Z7901 Long term (current) use of anticoagulants: Secondary | ICD-10-CM

## 2012-10-25 DIAGNOSIS — L570 Actinic keratosis: Secondary | ICD-10-CM | POA: Diagnosis not present

## 2012-10-25 DIAGNOSIS — Z85828 Personal history of other malignant neoplasm of skin: Secondary | ICD-10-CM | POA: Diagnosis not present

## 2012-10-25 LAB — POCT INR: INR: 1.3

## 2012-10-29 ENCOUNTER — Encounter: Payer: Self-pay | Admitting: Family Medicine

## 2012-10-31 ENCOUNTER — Encounter: Payer: Self-pay | Admitting: Internal Medicine

## 2012-11-02 ENCOUNTER — Ambulatory Visit (INDEPENDENT_AMBULATORY_CARE_PROVIDER_SITE_OTHER): Payer: Medicare Other

## 2012-11-02 DIAGNOSIS — Z7901 Long term (current) use of anticoagulants: Secondary | ICD-10-CM

## 2012-11-02 DIAGNOSIS — R3129 Other microscopic hematuria: Secondary | ICD-10-CM | POA: Diagnosis not present

## 2012-11-02 DIAGNOSIS — N401 Enlarged prostate with lower urinary tract symptoms: Secondary | ICD-10-CM | POA: Diagnosis not present

## 2012-11-02 DIAGNOSIS — I4891 Unspecified atrial fibrillation: Secondary | ICD-10-CM | POA: Diagnosis not present

## 2012-11-02 LAB — POCT INR: INR: 1.6

## 2012-11-16 ENCOUNTER — Ambulatory Visit (INDEPENDENT_AMBULATORY_CARE_PROVIDER_SITE_OTHER): Payer: Medicare Other | Admitting: *Deleted

## 2012-11-16 DIAGNOSIS — Z7901 Long term (current) use of anticoagulants: Secondary | ICD-10-CM

## 2012-11-16 DIAGNOSIS — I4891 Unspecified atrial fibrillation: Secondary | ICD-10-CM

## 2012-11-21 DIAGNOSIS — N200 Calculus of kidney: Secondary | ICD-10-CM | POA: Diagnosis not present

## 2012-11-21 DIAGNOSIS — R3129 Other microscopic hematuria: Secondary | ICD-10-CM | POA: Diagnosis not present

## 2012-11-21 DIAGNOSIS — N281 Cyst of kidney, acquired: Secondary | ICD-10-CM | POA: Diagnosis not present

## 2012-11-21 DIAGNOSIS — N401 Enlarged prostate with lower urinary tract symptoms: Secondary | ICD-10-CM | POA: Diagnosis not present

## 2012-11-21 DIAGNOSIS — D414 Neoplasm of uncertain behavior of bladder: Secondary | ICD-10-CM | POA: Diagnosis not present

## 2012-11-22 ENCOUNTER — Telehealth: Payer: Self-pay | Admitting: Internal Medicine

## 2012-11-22 ENCOUNTER — Other Ambulatory Visit: Payer: Self-pay | Admitting: Urology

## 2012-11-22 NOTE — Telephone Encounter (Signed)
I advised Pam @ [facility] that Dr. Elease Hashimoto has advised that patient can come off Coumadin 5 days prior to procedure and resume immediately following.  Pam verbalized understanding.

## 2012-11-22 NOTE — Telephone Encounter (Signed)
Spoke with Pam at [facility] who called to get cardiac clearance for an outpatient bladder biopsy under general anesthesia that will be scheduled once clearance has been given.  Physician would like patient to be off Coumadin 3-5 days prior to procedure.  I advised that Dr. Graciela Husbands is out of the office so I will talk with Dr. Elease Hashimoto, DOD who is covering Dr. Graciela Husbands today.  Pam verbalized understanding.

## 2012-11-22 NOTE — Telephone Encounter (Signed)
Note faxed to Pam at [phone]

## 2012-11-22 NOTE — Telephone Encounter (Signed)
Telephoned pt and he is aware to call us with his surgery date so we can hold coumadin and schedule his follow up appt.

## 2012-11-22 NOTE — Telephone Encounter (Signed)
LMTCB

## 2012-11-22 NOTE — Telephone Encounter (Signed)
New problem   Cardiac clearance   Upcoming bladder biopsy . Please advise on coumadin.

## 2012-11-23 ENCOUNTER — Encounter: Payer: Self-pay | Admitting: Family Medicine

## 2012-11-23 DIAGNOSIS — D494 Neoplasm of unspecified behavior of bladder: Secondary | ICD-10-CM | POA: Insufficient documentation

## 2012-12-05 ENCOUNTER — Other Ambulatory Visit: Payer: Self-pay | Admitting: Internal Medicine

## 2012-12-05 NOTE — Telephone Encounter (Signed)
Fax Received. Refill Completed. Sultan Pargas Chowoe (R.M.A)   

## 2012-12-07 ENCOUNTER — Ambulatory Visit (INDEPENDENT_AMBULATORY_CARE_PROVIDER_SITE_OTHER): Payer: Medicare Other | Admitting: *Deleted

## 2012-12-07 DIAGNOSIS — I4891 Unspecified atrial fibrillation: Secondary | ICD-10-CM

## 2012-12-07 DIAGNOSIS — Z7901 Long term (current) use of anticoagulants: Secondary | ICD-10-CM | POA: Diagnosis not present

## 2012-12-07 NOTE — Patient Instructions (Addendum)
Last day to take coumadin is August 27th No coumadin on August 28th  29th  30th  31st and September 1st September 2nd day of procedure no coumadin and after procedure when instructed by MD doing procedure to restart coumadin restart same dose but take extra 1/2 tablet for 2 days see in clinic on September  9th

## 2012-12-14 ENCOUNTER — Encounter: Payer: Self-pay | Admitting: Family Medicine

## 2012-12-14 LAB — CHOLESTEROL, TOTAL
ALT: 19 U/L (ref 10–40)
Cholesterol: 176 mg/dL (ref 0–200)
Creat: 1
Glucose: 90
HGB: 14.5 g/dL
LDL Cholesterol: 95 mg/dL

## 2012-12-17 ENCOUNTER — Encounter (HOSPITAL_BASED_OUTPATIENT_CLINIC_OR_DEPARTMENT_OTHER): Payer: Self-pay | Admitting: *Deleted

## 2012-12-17 NOTE — Progress Notes (Signed)
NPO AFTER MN. ARRIVES AT 0600. NEEDS PT/INR , BMET AND HG.  CURRENT EKG IN EPIC AND CHART. WILL TAKE LOSARTAN AND AMIODARONE AM OF SURG W/ SIPS OF WATER.

## 2012-12-21 NOTE — H&P (Signed)
H&P:  History of Present Illness:  Patient presents today for cystoscopy and bladder biopsy in the evaluation of hematuria. He is well today without fever, dysuria or hematuria. He stopped Coumadin last week.   Prior urologic history:  BPH  -Jul 2014 weak stream and hesitancy. He has no frequency. He has no urgency. He has no gross hematuria. Nocturia x 1-2.  Tried tamsulosin PSA 0.54  MH -Oct 2009 cystoscopy negative -Feb 2010 CT A/P - benign but did show bilateral stones and a renal cyst. -Aug 2014 CT A/P - unofficially there is a benign right renal cyst, small bilateral renal stones with the largest in the right lower pole about 6 mm Cystoscopy - lesion at dome - ?AVF  Prostatitis He has a h/o prostatitis symptoms of low back and suprapubic pain treated with tamsulosin and trimethoprim.          Past Medical History  Diagnosis Date  . HTN (hypertension)   . Paroxysmal atrial fibrillation   . Mild aortic stenosis   . Coronary artery disease CARDIOLOGIST-  DR KLEIN/ [facility]    Prior CABG 2004, patent grafts 2007, ejection fraction 45%  . S/P CABG x 5   . History of ischemic heart disease   . 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 Dyslipidemia   . Anticoagulated on Coumadin   . GERD (gastroesophageal reflux disease)   . PONV (postoperative nausea and vomiting)   . Chronic cough     NO CARDIAC OR PULMONARY RELATED  . Bladder neoplasm   . Arthritis   . Dry eyes    Past Surgical History  Procedure Laterality Date  . Electrophysiology study  07-27-2005  DR Lewayne Bunting    MAPPING --   RESULT NONINDUCIBLE VT OR SVT/  DX RIGHT VENTRICULAR OUTFLOW TRACT VT AND RULES OUT MORE MALIGNANT CAUSES OF VT  . Lumbar disc surgery  1999    L4 -- L5  . Inguinal hernia repair Bilateral 1954  &  1990  . Knee arthroscopy Right 1994  . Coronary artery bypass graft  03-08-2003  DR ZOXWRUEA    LIMA TO LAD/ SVG TO OM2/  SVG TO DIAGONAL / SVG TO PDA & PLA   . Removal vocal cord polyps  1978  . Transthoracic echocardiogram  08-08-2011    MILD LVH/  EF 55-60%/  GRADE I DIASTOLIC DYSFUNCTION/ MILD AV STENOSIS  . Cardiac catheterization  07-26-2005  DR GAMBLE    PRESERVED LVF/  EF 50%/ PATENT GRAFTS  . Cardiac catheterization  03-04-2003  DR GAMBLE    SEVERE 3 VESSEL DISEASE  . Cardiac catheterization  1991  DR [facility]    PAF/ FALSE POSITIVE STRESS TEST  . Cataract extraction w/ intraocular lens implant Right   . Tonsillectomy  AS CHILD    Home Medications:  No prescriptions prior to admission   Allergies:  Allergies  Allergen Reactions  . Ace Inhibitors Other (See Comments)    COUGH  . Hycodan [Hydrocodone-Homatropine] Nausea And Vomiting  . Zocor [Simvastatin] Other (See Comments)    MYALGIA  . Cephalexin Rash    History reviewed. No pertinent family history. Social History:  reports that he quit smoking about 32 years ago. His smoking use included Cigarettes. He has a 25 pack-year smoking history. He has never used smokeless tobacco. He reports that he does not drink alcohol or use illicit drugs.  ROS: A complete review of systems was performed.  All systems are negative except for pertinent  findings as noted. @ROS @   Physical Exam:  Vital signs in last 24 hours:   General:  Alert and oriented, No acute distress HEENT: Normocephalic, atraumatic Neck: No JVD or lymphadenopathy Cardiovascular: Regular rate and rhythm Lungs: Regular rate and effort Abdomen: Soft, nontender, nondistended, no abdominal masses Back: No CVA tenderness Extremities: No edema Neurologic: Grossly intact  Laboratory Data:  No results found for this or any previous visit (from the past 24 hour(s)). No results found for this or any previous visit (from the past 240 hour(s)). Creatinine: No results found for this basename: CREATININE,  in the last 168 hours ISTAT normal  Impression/Assessment:  Bladder neoplasm - discussed with patient and  wife the nature, potential benefits, risks and alternatives to cystoscopy, bladder biopsy and fulguration, including side effects of the proposed treatment, the likelihood of the patient achieving the goals of the procedure, and any potential problems that might occur during the procedure or recuperation. All questions answered. They asked about intubation vs. Sedation. I discussed with anesthesia and they plan for LMA. Pt agreed with this plan (he recalled waking up in ICU after heart surgery intubated and did not want to do that again). Patient elects to proceed.   Antony Haste

## 2012-12-24 ENCOUNTER — Encounter (HOSPITAL_BASED_OUTPATIENT_CLINIC_OR_DEPARTMENT_OTHER): Payer: Self-pay | Admitting: Anesthesiology

## 2012-12-24 NOTE — Anesthesia Preprocedure Evaluation (Addendum)
Anesthesia Evaluation  Patient identified by MRN, date of birth, ID band Patient awake  General Assessment Comment: HTN (hypertension)     .  Paroxysmal atrial fibrillation     .  Mild aortic stenosis     .  Coronary artery disease  CARDIOLOGIST-  DR KLEIN/ [facility]RED       Prior CABG 2004, patent grafts 2007, ejection fraction 45%   .  S/P CABG x 5     .  History of ischemic heart disease     .  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  Dyslipidemia     .  Anticoagulated on Coumadin     .  GERD (gastroesophageal reflux disease)     .  PONV (postoperative nausea and vomiting)     .  Chronic cough         NO CARDIAC OR PULMONARY RELATED   .  Bladder neoplasm     .  Arthritis     .  Dry eyes       Reviewed: Allergy & Precautions, H&P , NPO status , Patient's Chart, lab work & pertinent test results  History of Anesthesia Complications (+) PONV  Airway Mallampati: II TM Distance: >3 FB Neck ROM: Full    Dental no notable dental hx.    Pulmonary former smoker,  Pulmonology note 08-27-12 reviewed: chronic cough  CXR 07-09-12 NAD breath sounds clear to auscultation  Pulmonary exam normal       Cardiovascular hypertension, Pt. on medications + CAD negative cardio ROS  + dysrhythmias Ventricular Tachycardia Rhythm:Regular Rate:Normal  ECG: Afib with run of ventricular ectopic beats.   Neuro/Psych  Headaches, negative psych ROS   GI/Hepatic Neg liver ROS, GERD-  Medicated,  Endo/Other  negative endocrine ROS  Renal/GU negative Renal ROS  negative genitourinary   Musculoskeletal negative musculoskeletal ROS (+)   Abdominal   Peds negative pediatric ROS (+)  Hematology negative hematology ROS (+)   Anesthesia Other Findings   Reproductive/Obstetrics negative OB ROS                         Anesthesia Physical Anesthesia Plan  ASA: III  Anesthesia Plan: General    Post-op Pain Management:    Induction: Intravenous  Airway Management Planned: LMA  Additional Equipment:   Intra-op Plan:   Post-operative Plan: Extubation in OR  Informed Consent: I have reviewed the patients History and Physical, chart, labs and discussed the procedure including the risks, benefits and alternatives for the proposed anesthesia with the patient or authorized representative who has indicated his/her understanding and acceptance.   Dental advisory given  Plan Discussed with: CRNA  Anesthesia Plan Comments:         Anesthesia Quick Evaluation

## 2012-12-25 ENCOUNTER — Encounter (HOSPITAL_BASED_OUTPATIENT_CLINIC_OR_DEPARTMENT_OTHER): Payer: Self-pay | Admitting: *Deleted

## 2012-12-25 ENCOUNTER — Ambulatory Visit (HOSPITAL_BASED_OUTPATIENT_CLINIC_OR_DEPARTMENT_OTHER): Payer: Medicare Other | Admitting: Anesthesiology

## 2012-12-25 ENCOUNTER — Encounter (HOSPITAL_BASED_OUTPATIENT_CLINIC_OR_DEPARTMENT_OTHER)
Admission: RE | Disposition: A | Discharge status: Home / Self Care | Payer: Self-pay | Source: Ambulatory Visit | Attending: Urology

## 2012-12-25 ENCOUNTER — Ambulatory Visit (HOSPITAL_BASED_OUTPATIENT_CLINIC_OR_DEPARTMENT_OTHER)
Admission: RE | Admit: 2012-12-25 | Discharge: 2012-12-25 | Disposition: A | Discharge status: Home / Self Care | Payer: Medicare Other | Source: Ambulatory Visit | Attending: Urology | Admitting: Urology

## 2012-12-25 ENCOUNTER — Encounter (HOSPITAL_BASED_OUTPATIENT_CLINIC_OR_DEPARTMENT_OTHER): Payer: Self-pay | Admitting: Anesthesiology

## 2012-12-25 DIAGNOSIS — R351 Nocturia: Secondary | ICD-10-CM | POA: Diagnosis not present

## 2012-12-25 DIAGNOSIS — I10 Essential (primary) hypertension: Secondary | ICD-10-CM | POA: Diagnosis not present

## 2012-12-25 DIAGNOSIS — I2510 Atherosclerotic heart disease of native coronary artery without angina pectoris: Secondary | ICD-10-CM | POA: Diagnosis not present

## 2012-12-25 DIAGNOSIS — I471 Supraventricular tachycardia, unspecified: Secondary | ICD-10-CM | POA: Insufficient documentation

## 2012-12-25 DIAGNOSIS — Z951 Presence of aortocoronary bypass graft: Secondary | ICD-10-CM | POA: Diagnosis not present

## 2012-12-25 DIAGNOSIS — D303 Benign neoplasm of bladder: Secondary | ICD-10-CM | POA: Insufficient documentation

## 2012-12-25 DIAGNOSIS — H04129 Dry eye syndrome of unspecified lacrimal gland: Secondary | ICD-10-CM | POA: Diagnosis not present

## 2012-12-25 DIAGNOSIS — E785 Hyperlipidemia, unspecified: Secondary | ICD-10-CM | POA: Insufficient documentation

## 2012-12-25 DIAGNOSIS — N3020 Other chronic cystitis without hematuria: Secondary | ICD-10-CM | POA: Diagnosis not present

## 2012-12-25 DIAGNOSIS — M129 Arthropathy, unspecified: Secondary | ICD-10-CM | POA: Insufficient documentation

## 2012-12-25 DIAGNOSIS — N400 Enlarged prostate without lower urinary tract symptoms: Secondary | ICD-10-CM | POA: Insufficient documentation

## 2012-12-25 DIAGNOSIS — Z87891 Personal history of nicotine dependence: Secondary | ICD-10-CM | POA: Diagnosis not present

## 2012-12-25 DIAGNOSIS — D414 Neoplasm of uncertain behavior of bladder: Secondary | ICD-10-CM | POA: Diagnosis not present

## 2012-12-25 DIAGNOSIS — Z7901 Long term (current) use of anticoagulants: Secondary | ICD-10-CM | POA: Diagnosis not present

## 2012-12-25 DIAGNOSIS — Z881 Allergy status to other antibiotic agents status: Secondary | ICD-10-CM | POA: Diagnosis not present

## 2012-12-25 DIAGNOSIS — Z885 Allergy status to narcotic agent status: Secondary | ICD-10-CM | POA: Insufficient documentation

## 2012-12-25 DIAGNOSIS — K219 Gastro-esophageal reflux disease without esophagitis: Secondary | ICD-10-CM | POA: Diagnosis not present

## 2012-12-25 DIAGNOSIS — I359 Nonrheumatic aortic valve disorder, unspecified: Secondary | ICD-10-CM | POA: Insufficient documentation

## 2012-12-25 DIAGNOSIS — R3129 Other microscopic hematuria: Secondary | ICD-10-CM | POA: Diagnosis not present

## 2012-12-25 DIAGNOSIS — Z888 Allergy status to other drugs, medicaments and biological substances status: Secondary | ICD-10-CM | POA: Insufficient documentation

## 2012-12-25 DIAGNOSIS — I4891 Unspecified atrial fibrillation: Secondary | ICD-10-CM | POA: Insufficient documentation

## 2012-12-25 DIAGNOSIS — D494 Neoplasm of unspecified behavior of bladder: Secondary | ICD-10-CM | POA: Diagnosis not present

## 2012-12-25 HISTORY — DX: Chronic cough: R05.3

## 2012-12-25 HISTORY — DX: Neoplasm of unspecified behavior of bladder: D49.4

## 2012-12-25 HISTORY — DX: Other specified postprocedural states: R11.2

## 2012-12-25 HISTORY — DX: Other specified postprocedural states: Z98.890

## 2012-12-25 HISTORY — DX: Long term (current) use of anticoagulants: Z79.01

## 2012-12-25 HISTORY — PX: CYSTOSCOPY WITH BIOPSY: SHX5122

## 2012-12-25 HISTORY — DX: Other ventricular tachycardia: I47.29

## 2012-12-25 HISTORY — DX: Dry eye syndrome of bilateral lacrimal glands: H04.123

## 2012-12-25 HISTORY — DX: Nonrheumatic aortic (valve) stenosis: I35.0

## 2012-12-25 HISTORY — DX: Hyperlipidemia, unspecified: E78.5

## 2012-12-25 HISTORY — DX: Ventricular tachycardia: I47.2

## 2012-12-25 HISTORY — DX: Cough: R05

## 2012-12-25 HISTORY — DX: Presence of aortocoronary bypass graft: Z95.1

## 2012-12-25 HISTORY — DX: Gastro-esophageal reflux disease without esophagitis: K21.9

## 2012-12-25 HISTORY — DX: Unspecified osteoarthritis, unspecified site: M19.90

## 2012-12-25 LAB — POCT I-STAT, CHEM 8
BUN: 18 mg/dL (ref 6–23)
Creatinine, Ser: 1 mg/dL (ref 0.50–1.35)
Hemoglobin: 15 g/dL (ref 13.0–17.0)
Potassium: 3.8 mEq/L (ref 3.5–5.1)
Sodium: 141 mEq/L (ref 135–145)

## 2012-12-25 LAB — BASIC METABOLIC PANEL
BUN: 17 mg/dL (ref 6–23)
CO2: 32 mEq/L (ref 19–32)
Chloride: 99 mEq/L (ref 96–112)
Creatinine, Ser: 0.9 mg/dL (ref 0.50–1.35)
Potassium: 3.8 mEq/L (ref 3.5–5.1)

## 2012-12-25 LAB — PROTIME-INR: INR: 1.1 (ref 0.00–1.49)

## 2012-12-25 SURGERY — CYSTOSCOPY, WITH BIOPSY
Anesthesia: General | Site: Bladder | Wound class: Clean Contaminated

## 2012-12-25 MED ORDER — URIBEL 118 MG PO CAPS: 1.0000 | ORAL_CAPSULE | Freq: Four times a day (QID) | ORAL | Status: DC | PRN

## 2012-12-25 MED ORDER — WARFARIN SODIUM 5 MG PO TABS: 5.0000 mg | ORAL_TABLET | Freq: Every day | ORAL | Status: DC

## 2012-12-25 MED ORDER — ONDANSETRON HCL 4 MG/2ML IJ SOLN
INTRAMUSCULAR | Status: DC | PRN
  Administered 2012-12-25: 4 mg via INTRAVENOUS

## 2012-12-25 MED ORDER — URELLE 81 MG PO TABS
1.0000 | ORAL_TABLET | Freq: Four times a day (QID) | ORAL | Status: DC
  Administered 2012-12-25: 81 mg via ORAL
  Filled 2012-12-25: qty 1

## 2012-12-25 MED ORDER — PROPOFOL 10 MG/ML IV BOLUS
INTRAVENOUS | Status: DC | PRN
  Administered 2012-12-25: 130 mg via INTRAVENOUS

## 2012-12-25 MED ORDER — EPHEDRINE SULFATE 50 MG/ML IJ SOLN
INTRAMUSCULAR | Status: DC | PRN
  Administered 2012-12-25: 10 mg via INTRAVENOUS

## 2012-12-25 MED ORDER — DEXAMETHASONE SODIUM PHOSPHATE 4 MG/ML IJ SOLN
INTRAMUSCULAR | Status: DC | PRN
  Administered 2012-12-25: 10 mg via INTRAVENOUS

## 2012-12-25 MED ORDER — LACTATED RINGERS IV SOLN
INTRAVENOUS | Status: DC
  Administered 2012-12-25 (×2): via INTRAVENOUS
  Filled 2012-12-25: qty 1000

## 2012-12-25 MED ORDER — LIDOCAINE HCL (CARDIAC) 20 MG/ML IV SOLN
INTRAVENOUS | Status: DC | PRN
  Administered 2012-12-25: 60 mg via INTRAVENOUS

## 2012-12-25 MED ORDER — DEXTROSE 5 % IV SOLN
5.0000 mg/kg | INTRAVENOUS | Status: DC
  Filled 2012-12-25: qty 11.74

## 2012-12-25 MED ORDER — BELLADONNA ALKALOIDS-OPIUM 16.2-60 MG RE SUPP
RECTAL | Status: DC | PRN
  Administered 2012-12-25: 1 via RECTAL

## 2012-12-25 MED ORDER — FENTANYL CITRATE 0.05 MG/ML IJ SOLN
25.0000 ug | INTRAMUSCULAR | Status: DC | PRN
  Filled 2012-12-25: qty 1

## 2012-12-25 MED ORDER — FENTANYL CITRATE 0.05 MG/ML IJ SOLN
INTRAMUSCULAR | Status: DC | PRN
  Administered 2012-12-25: 50 ug via INTRAVENOUS

## 2012-12-25 MED ORDER — STERILE WATER FOR IRRIGATION IR SOLN
Status: DC | PRN
  Administered 2012-12-25: 3000 mL

## 2012-12-25 MED ORDER — METOCLOPRAMIDE HCL 5 MG/ML IJ SOLN
INTRAMUSCULAR | Status: DC | PRN
  Administered 2012-12-25: 10 mg via INTRAVENOUS

## 2012-12-25 MED ORDER — GENTAMICIN SULFATE 40 MG/ML IJ SOLN
460.0000 mg | Freq: Once | INTRAVENOUS | Status: AC
  Administered 2012-12-25: 460 mg via INTRAVENOUS
  Filled 2012-12-25: qty 11.5

## 2012-12-25 SURGICAL SUPPLY — 15 items
BAG DRAIN URO-CYSTO SKYTR STRL (DRAIN) ×2 IMPLANT
BAG DRN UROCATH (DRAIN) ×1
CANISTER SUCT LVC 12 LTR MEDI- (MISCELLANEOUS) ×1 IMPLANT
CLOTH BEACON ORANGE TIMEOUT ST (SAFETY) ×2 IMPLANT
DRAPE CAMERA CLOSED 9X96 (DRAPES) ×2 IMPLANT
ELECT REM PT RETURN 9FT ADLT (ELECTROSURGICAL) ×2
ELECTRODE REM PT RTRN 9FT ADLT (ELECTROSURGICAL) ×1 IMPLANT
GLOVE BIO SURGEON STRL SZ7.5 (GLOVE) ×2 IMPLANT
GLOVE BIOGEL M 6.5 STRL (GLOVE) ×1 IMPLANT
GOWN STRL NON-REIN LRG LVL3 (GOWN DISPOSABLE) ×1 IMPLANT
GOWN STRL REIN XL XLG (GOWN DISPOSABLE) ×2 IMPLANT
NEEDLE HYPO 22GX1.5 SAFETY (NEEDLE) IMPLANT
NS IRRIG 500ML POUR BTL (IV SOLUTION) IMPLANT
PACK CYSTOSCOPY (CUSTOM PROCEDURE TRAY) ×2 IMPLANT
WATER STERILE IRR 3000ML UROMA (IV SOLUTION) ×2 IMPLANT

## 2012-12-25 NOTE — Anesthesia Procedure Notes (Signed)
Procedure Name: LMA Insertion Date/Time: 12/25/2012 7:45 AM Performed by: Norva Pavlov Pre-anesthesia Checklist: Patient identified, Emergency Drugs available, Suction available and Patient being monitored Patient Re-evaluated:Patient Re-evaluated prior to inductionOxygen Delivery Method: Circle System Utilized Preoxygenation: Pre-oxygenation with 100% oxygen Intubation Type: IV induction Ventilation: Mask ventilation without difficulty LMA: LMA with gastric port inserted LMA Size: 5.0 Number of attempts: 1 Placement Confirmation: positive ETCO2 Tube secured with: Tape Dental Injury: Teeth and Oropharynx as per pre-operative assessment

## 2012-12-25 NOTE — Op Note (Signed)
Pre-op dx: bladder neoplasm, hematuria Post-op Dx: same  Procedure: EUA Cystoscopy, bladder biopsy, fulguration  Surgeon: Deaundra Dupriest  Type of Anes: General  Findings: EUA - normal penis and testes without mass. DRE - 30 g prostate smooth and bening. No hard area of nodule. Abd - ND, no mass  Cystoscopy - a red papule at right bladder near dome, otherwise negative. Non-obs prostate with short prostatic urethra. Bilateral clear efflux with normal trigone and UO's.   Description of procedure: Pt brought to OR and after adequate anesthesia prepped and draped in lithotomy position. A timeout peformed. EUA and B&O supp placed. Bladder inspected with 12 and 70 degree lens with cystoscope. Right bladder bx x 1 with fulguration. Excellent hemostasis under low pressure. Bladder drained and scope removed.   EBL: minimal Drains: none Complication: none  Specimen: right bladder biopsy top path  Disposition: pt stable to PACU.

## 2012-12-25 NOTE — Anesthesia Postprocedure Evaluation (Signed)
  Anesthesia Post-op Note  Patient: [name] [name]  Procedure(s) Performed: Procedure(s) (LRB): CYSTOSCOPY WITH BLADDER BIOPSY (N/A)  Patient Location: PACU  Anesthesia Type: General  Level of Consciousness: awake and alert   Airway and Oxygen Therapy: Patient Spontanous Breathing  Post-op Pain: mild  Post-op Assessment: Post-op Vital signs reviewed, Patient's Cardiovascular Status Stable, Respiratory Function Stable, Patent Airway and No signs of Nausea or vomiting  Last Vitals:  Filed Vitals:   12/25/12 0830  BP: 125/73  Pulse: 67  Temp:   Resp: 10    Post-op Vital Signs: stable   Complications: No apparent anesthesia complications

## 2012-12-25 NOTE — Transfer of Care (Signed)
Immediate Anesthesia Transfer of Care Note  Patient: [name] [name]  Procedure(s) Performed: Procedure(s) (LRB): CYSTOSCOPY WITH BLADDER BIOPSY (N/A)  Patient Location: PACU  Anesthesia Type: General  Level of Consciousness: awake, alert  and oriented  Airway & Oxygen Therapy: Patient Spontanous Breathing and Patient connected to face mask oxygen  Post-op Assessment: Report given to PACU RN and Post -op Vital signs reviewed and stable  Post vital signs: Reviewed and stable  Complications: No apparent anesthesia complications

## 2012-12-25 NOTE — Addendum Note (Signed)
Addendum created 12/25/12 7829 by Norva Pavlov, CRNA   Modules edited: Anesthesia Blocks and Procedures

## 2012-12-26 ENCOUNTER — Telehealth: Payer: Self-pay | Admitting: Internal Medicine

## 2012-12-26 NOTE — Telephone Encounter (Signed)
Returned call to pt, pt wanted to r/s appt time on 01/01/13 from 3pm to 8:15am.  Rescheduled appt, pt aware.

## 2012-12-26 NOTE — Telephone Encounter (Signed)
New Prob  Pt is requesting to speak with Tiffany, he did not say what it was concerning.

## 2013-01-01 ENCOUNTER — Ambulatory Visit (INDEPENDENT_AMBULATORY_CARE_PROVIDER_SITE_OTHER): Payer: Medicare Other | Admitting: *Deleted

## 2013-01-01 ENCOUNTER — Encounter (HOSPITAL_BASED_OUTPATIENT_CLINIC_OR_DEPARTMENT_OTHER): Payer: Self-pay | Admitting: Urology

## 2013-01-01 DIAGNOSIS — Z7901 Long term (current) use of anticoagulants: Secondary | ICD-10-CM

## 2013-01-01 DIAGNOSIS — I4891 Unspecified atrial fibrillation: Secondary | ICD-10-CM | POA: Diagnosis not present

## 2013-01-01 LAB — POCT INR: INR: 2.2

## 2013-01-08 DIAGNOSIS — R319 Hematuria, unspecified: Secondary | ICD-10-CM | POA: Diagnosis not present

## 2013-01-08 DIAGNOSIS — N401 Enlarged prostate with lower urinary tract symptoms: Secondary | ICD-10-CM | POA: Diagnosis not present

## 2013-01-08 DIAGNOSIS — R3129 Other microscopic hematuria: Secondary | ICD-10-CM | POA: Diagnosis not present

## 2013-01-16 ENCOUNTER — Ambulatory Visit (INDEPENDENT_AMBULATORY_CARE_PROVIDER_SITE_OTHER): Payer: Medicare Other

## 2013-01-16 DIAGNOSIS — Z23 Encounter for immunization: Secondary | ICD-10-CM | POA: Diagnosis not present

## 2013-01-24 ENCOUNTER — Ambulatory Visit (INDEPENDENT_AMBULATORY_CARE_PROVIDER_SITE_OTHER): Payer: Medicare Other | Admitting: *Deleted

## 2013-01-24 DIAGNOSIS — Z7901 Long term (current) use of anticoagulants: Secondary | ICD-10-CM

## 2013-01-24 DIAGNOSIS — Z5181 Encounter for therapeutic drug level monitoring: Secondary | ICD-10-CM

## 2013-01-24 DIAGNOSIS — Z85828 Personal history of other malignant neoplasm of skin: Secondary | ICD-10-CM | POA: Diagnosis not present

## 2013-01-24 DIAGNOSIS — I4891 Unspecified atrial fibrillation: Secondary | ICD-10-CM

## 2013-01-24 DIAGNOSIS — L820 Inflamed seborrheic keratosis: Secondary | ICD-10-CM | POA: Diagnosis not present

## 2013-01-24 DIAGNOSIS — L570 Actinic keratosis: Secondary | ICD-10-CM | POA: Diagnosis not present

## 2013-02-04 ENCOUNTER — Other Ambulatory Visit: Payer: Self-pay | Admitting: Internal Medicine

## 2013-02-05 ENCOUNTER — Encounter: Payer: Self-pay | Admitting: Family Medicine

## 2013-02-05 ENCOUNTER — Ambulatory Visit (INDEPENDENT_AMBULATORY_CARE_PROVIDER_SITE_OTHER): Payer: Medicare Other | Admitting: Family Medicine

## 2013-02-05 VITALS — BP 122/70 | HR 68 | Temp 98.5°F | Wt 206.0 lb

## 2013-02-05 DIAGNOSIS — R05 Cough: Secondary | ICD-10-CM

## 2013-02-05 NOTE — Patient Instructions (Signed)
Common causes- GERD, COPD, asthma, BP meds, post nasal drip.  Let me talk to pulmonary in the meantime.   Take care.

## 2013-02-05 NOTE — Progress Notes (Signed)
Still with cough originating in the throat. occ sputum, clearing it will help for a while. Had seen ENT and pulmonary.  Pt thinks it is coming from post nasal gtt.  Can't sleep on his back.  Has to sleep on his side.  PPI and antihistamines didn't help.  Coming off ARB didn't help.  Peanuts and peanut butter seem to make it worse.  He was concerned about possible food allergies causing the sx.  He didn't think prednisone course helped.    Meds, vitals, and allergies reviewed.   ROS: See HPI.  Otherwise, noncontributory.  GEN: nad, alert and oriented HEENT: mucous membranes moist, tm and nasal exam wnl, OP wnl NECK: supple w/o LA CV: rrr PULM: ctab, no inc wob ABD: soft, +bs EXT: no edema SKIN: no acute rash

## 2013-02-06 NOTE — Assessment & Plan Note (Signed)
D/w pt re: ddx.  Will ask for pulm input.  Pt agrees.

## 2013-02-20 ENCOUNTER — Ambulatory Visit (INDEPENDENT_AMBULATORY_CARE_PROVIDER_SITE_OTHER): Payer: Medicare Other | Admitting: Pharmacist

## 2013-02-20 DIAGNOSIS — I4891 Unspecified atrial fibrillation: Secondary | ICD-10-CM

## 2013-02-20 DIAGNOSIS — Z7901 Long term (current) use of anticoagulants: Secondary | ICD-10-CM

## 2013-02-20 LAB — POCT INR: INR: 2

## 2013-03-27 ENCOUNTER — Ambulatory Visit (INDEPENDENT_AMBULATORY_CARE_PROVIDER_SITE_OTHER): Payer: Medicare Other | Admitting: *Deleted

## 2013-03-27 DIAGNOSIS — Z7901 Long term (current) use of anticoagulants: Secondary | ICD-10-CM | POA: Diagnosis not present

## 2013-03-27 DIAGNOSIS — I4891 Unspecified atrial fibrillation: Secondary | ICD-10-CM | POA: Diagnosis not present

## 2013-03-27 LAB — POCT INR: INR: 2.1

## 2013-03-29 ENCOUNTER — Other Ambulatory Visit: Payer: Self-pay | Admitting: Internal Medicine

## 2013-04-04 ENCOUNTER — Telehealth: Payer: Self-pay | Admitting: Internal Medicine

## 2013-04-04 NOTE — Telephone Encounter (Signed)
Follow Up  Pt returned call // Please call back//SR

## 2013-04-04 NOTE — Telephone Encounter (Signed)
New Message  PT requests a call back to discuss AFIB medication.. States that within the last week AFIB has been off and on and more often at night// Requests a call back to discuss.[name] [name]

## 2013-04-04 NOTE — Telephone Encounter (Signed)
Pt states that he was taking 1/2 tablet of Amiodarone. But for almost a week he has increased this to whole tablet secondary to A Fib acting up, especially at night. Will discuss with Dr. Graciela Husbands and get back with pt. Patient agreeable to plan.

## 2013-04-08 NOTE — Telephone Encounter (Signed)
Scheduled OV (per Dr. Graciela Husbands) for 05/16/13 at 10am. Pt agreeable to plan.

## 2013-05-09 ENCOUNTER — Ambulatory Visit (INDEPENDENT_AMBULATORY_CARE_PROVIDER_SITE_OTHER): Payer: Medicare Other

## 2013-05-09 DIAGNOSIS — I4891 Unspecified atrial fibrillation: Secondary | ICD-10-CM

## 2013-05-09 DIAGNOSIS — Z5181 Encounter for therapeutic drug level monitoring: Secondary | ICD-10-CM

## 2013-05-09 DIAGNOSIS — Z7901 Long term (current) use of anticoagulants: Secondary | ICD-10-CM | POA: Diagnosis not present

## 2013-05-09 LAB — POCT INR: INR: 1.9

## 2013-05-16 ENCOUNTER — Ambulatory Visit (INDEPENDENT_AMBULATORY_CARE_PROVIDER_SITE_OTHER): Payer: Medicare Other | Admitting: Internal Medicine

## 2013-05-16 ENCOUNTER — Encounter: Payer: Self-pay | Admitting: Internal Medicine

## 2013-05-16 VITALS — BP 148/83 | HR 72 | Ht 72.0 in | Wt 207.0 lb

## 2013-05-16 DIAGNOSIS — I359 Nonrheumatic aortic valve disorder, unspecified: Secondary | ICD-10-CM

## 2013-05-16 DIAGNOSIS — R0989 Other specified symptoms and signs involving the circulatory and respiratory systems: Secondary | ICD-10-CM

## 2013-05-16 DIAGNOSIS — I4891 Unspecified atrial fibrillation: Secondary | ICD-10-CM | POA: Diagnosis not present

## 2013-05-16 DIAGNOSIS — I25810 Atherosclerosis of coronary artery bypass graft(s) without angina pectoris: Secondary | ICD-10-CM | POA: Diagnosis not present

## 2013-05-16 DIAGNOSIS — R0609 Other forms of dyspnea: Secondary | ICD-10-CM | POA: Insufficient documentation

## 2013-05-16 DIAGNOSIS — I350 Nonrheumatic aortic (valve) stenosis: Secondary | ICD-10-CM | POA: Insufficient documentation

## 2013-05-16 LAB — COMPREHENSIVE METABOLIC PANEL
ALK PHOS: 55 U/L (ref 39–117)
ALT: 15 U/L (ref 0–53)
AST: 23 U/L (ref 0–37)
Albumin: 4.1 g/dL (ref 3.5–5.2)
BILIRUBIN TOTAL: 0.6 mg/dL (ref 0.3–1.2)
BUN: 18 mg/dL (ref 6–23)
CO2: 30 meq/L (ref 19–32)
Calcium: 9.3 mg/dL (ref 8.4–10.5)
Chloride: 103 mEq/L (ref 96–112)
Creatinine, Ser: 1 mg/dL (ref 0.4–1.5)
GFR: 78.9 mL/min (ref >60.00)
Glucose, Bld: 93 mg/dL (ref 70–99)
Potassium: 4 mEq/L (ref 3.5–5.1)
SODIUM: 140 meq/L (ref 135–145)
TOTAL PROTEIN: 7.6 g/dL (ref 6.0–8.3)

## 2013-05-16 LAB — HEPATIC FUNCTION PANEL
ALBUMIN: 4.1 g/dL (ref 3.5–5.2)
ALT: 15 U/L (ref 0–53)
AST: 23 U/L (ref 0–37)
Alkaline Phosphatase: 55 U/L (ref 39–117)
Bilirubin, Direct: 0.1 mg/dL (ref 0.0–0.3)
Total Bilirubin: 0.6 mg/dL (ref 0.3–1.2)
Total Protein: 7.6 g/dL (ref 6.0–8.3)

## 2013-05-16 LAB — TSH: TSH: 4.17 u[IU]/mL (ref 0.35–5.50)

## 2013-05-16 NOTE — Patient Instructions (Addendum)
Your physician recommends that you continue on your current medications as directed. Please refer to the Current Medication list given to you today.  Your physician has requested that you have an echocardiogram. Echocardiography is a painless test that uses sound waves to create images of your heart. It provides your doctor with information about the size and shape of your heart and how well your heart's chambers and valves are working. This procedure takes approximately one hour. There are no restrictions for this procedure.  Your physician recommends you have a stress myoview.  Your physician has recommended that you have a pulmonary function test. Pulmonary Function Tests are a group of tests that measure how well air moves in and out of your lungs.  Your physician wants you to follow-up in: 6 months with Dr. Caryl Comes. You will receive a reminder letter in the mail two months in advance. If you don't receive a letter, please call our office to schedule the follow-up appointment.  Your physician recommends that you return for lab work today: TSH/LFT

## 2013-05-16 NOTE — Progress Notes (Signed)
Patient Care Team: Tonia Ghent, MD as PCP - General (Family Medicine)   HPI  [name] [name] is a 79 y.o. male Seen in followup for atrial fibrillation and VT been treated with amiodarone. Thromboembolic risk factors are notable for a chads vas score of 3  He underwent monitoring after initial visit to clarify the presence of atrial fibrillation.This prompted reinitiation of his warfarin and his amiodarone.Intercurrently he has seen Dr. Rayann Heman to consider catheter ablation but decided that he would continue on amiodarone presently    Echocardiogram demonstrated mild aortic stenosis and left atrial enlargement with normal left ventricular function this is most recently checked 4/13  He has a history of ischemic heart disease with prior bypass surgery 2004 Catheterization   2007 demonstrated patent grafts   He has had a problem with a cough. His ARB was changed from cozaaar >> benicar  last year. He has been on amiodarone for about 7 years. He also has a sensation of a fullness in his throat that sometimes "blows up like a balloon". It is not associated with the brackish taste.there have been no significant shortness of breath.  Pulmonary function testing has been done annually for thelast 3 years.DLCO's have been in sequence, 81, 87, and 77%.  He has had problems recently with dyspnea walking back up his driveway. This has been unassociated with chest pain. There has been no peripheral edema.  He's also had problems with recurrent palpitations are two fourths, the first is rhythmic loss of pulse most consistent I think with PVCs and the other is recurrent atrial fibrillation  Past Medical History  Diagnosis Date  . HTN (hypertension)   . Paroxysmal atrial fibrillation   . Mild aortic stenosis   . Coronary artery disease CARDIOLOGIST-  DR Anaiza Behrens/ [facility]    Prior CABG 2004, patent grafts 2007, ejection fraction 45%  . S/P CABG x 5   . History of ischemic heart disease   .  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 Dyslipidemia   . Anticoagulated on Coumadin   . GERD (gastroesophageal reflux disease)   . PONV (postoperative nausea and vomiting)   . Chronic cough     NO CARDIAC OR PULMONARY RELATED  . Bladder neoplasm   . Arthritis   . Dry eyes     Past Surgical History  Procedure Laterality Date  . Electrophysiology study  07-27-2005  DR Cristopher Peru    MAPPING --   RESULT NONINDUCIBLE VT OR SVT/  DX RIGHT VENTRICULAR OUTFLOW TRACT VT AND RULES OUT MORE MALIGNANT CAUSES OF VT  . Lumbar disc surgery  1999    L4 -- L5  . Inguinal hernia repair Bilateral 1954  &  1990  . Knee arthroscopy Right 1994  . Coronary artery bypass graft  03-08-2003  DR [id number]    LIMA TO LAD/ SVG TO OM2/  SVG TO DIAGONAL / SVG TO PDA & PLA  . Removal vocal cord polyps  1978  . Transthoracic echocardiogram  08-08-2011    MILD LVH/  EF 78-93%/  GRADE I DIASTOLIC DYSFUNCTION/ MILD AV STENOSIS  . Cardiac catheterization  07-26-2005  DR GAMBLE    PRESERVED LVF/  EF 50%/ PATENT GRAFTS  . Cardiac catheterization  03-04-2003  DR GAMBLE    SEVERE 3 VESSEL DISEASE  . Cardiac catheterization  [place]    PAF/ FALSE POSITIVE STRESS TEST  . Cataract extraction w/ intraocular lens implant Right   .  Tonsillectomy  AS CHILD  . Cystoscopy with biopsy N/A 12/25/2012    Procedure: CYSTOSCOPY WITH BLADDER BIOPSY;  Surgeon: Fredricka Bonine, MD;  Location: [facility];  Service: Urology;  Laterality: N/A;    Current Outpatient Prescriptions  Medication Sig Dispense Refill  . amiodarone (PACERONE) 200 MG tablet Take 100 mg by mouth every morning.      . butalbital-acetaminophen-caffeine (FIORICET, ESGIC) 50-325-40 MG per tablet Take 1 tablet by mouth as needed.      . cetirizine (ZYRTEC) 10 MG tablet Take 10 mg by mouth daily.      . Cholecalciferol (VITAMIN D3) 2000 UNITS TABS Take 1 capsule by mouth every evening.      .  hydrochlorothiazide (HYDRODIURIL) 25 MG tablet TAKE 1 TABLET BY MOUTH DAILY  90 tablet  0  . losartan (COZAAR) 100 MG tablet Take 100 mg by mouth every morning.       Vladimir Faster Glycol-Propyl Glycol (SYSTANE ULTRA OP) Apply to eye as needed.      . potassium chloride (K-DUR) 10 MEQ tablet Take 10 mEq by mouth every morning. Take as directed      . pravastatin (PRAVACHOL) 80 MG tablet Take 80 mg by mouth every evening.       . warfarin (COUMADIN) 5 MG tablet Take 1-1.5 tablets (5-7.5 mg total) by mouth daily. Take as directed by anticoagulation clinic--  7.5MG  M/W/F/SAT   AND  5 MG THE OTHER DAYS       No current facility-administered medications for this visit.    Allergies  Allergen Reactions  . Ace Inhibitors Other (See Comments)    COUGH  . Hycodan [Hydrocodone-Homatropine] Nausea And Vomiting  . Zocor [Simvastatin] Other (See Comments)    MYALGIA  . Cephalexin Rash    Review of Systems negative except from HPI and PMH  Physical Exam BP 148/83  Pulse 72  Ht 6' (1.829 m)  Wt 207 lb (93.895 kg)  BMI 28.07 kg/m2 Well developed and well nourished in no acute distress HENT normal E scleral and icterus clear Neck Supple JVP flat; carotids brisk and full Clear to ausculation  Regular rate and rhythm, 2/6 systolic murmur with some intermittent diastolic murmur. S2 is split Soft with active bowel sounds No clubbing cyanosis none Edema Alert and oriented, grossly normal motor and sensory function Skin Warm and Dry  ECG demonstrates sinus rhythm at 72 right bundle branch block intervals 18/16/48   Assessment and  Plan

## 2013-05-16 NOTE — Assessment & Plan Note (Signed)
As above.

## 2013-05-16 NOTE — Assessment & Plan Note (Signed)
With recurring dyspnea, will undertake Myoview scanning will do this with stress testing also to exclude chronotropic incompetence

## 2013-05-16 NOTE — Assessment & Plan Note (Signed)
Dyspnea on exertion has a broad differential diagnosis here. Includes recurrent ischemia so will undertake Myoview scanning. It also includes amiodarone lung toxicity so will undertake PFTs with DLCO not withstanding improved cough. It also could be related to his aortic valve. It has been 2 years. Will repeat echo to look at his aortic stenosis

## 2013-05-16 NOTE — Assessment & Plan Note (Signed)
Recurrent palpitations. Some consistent with atrial fibrillation. They're better in his having up titrate his amiodarone from 100--200 mg a day. We will check surveillance laboratories. We'll continue Coumadin.

## 2013-05-16 NOTE — Assessment & Plan Note (Signed)
Blood pressure reasonably controlled. We'll check electrolytes.

## 2013-05-29 ENCOUNTER — Ambulatory Visit (HOSPITAL_COMMUNITY): Payer: Medicare Other | Attending: Cardiology | Admitting: Radiology

## 2013-05-29 ENCOUNTER — Encounter: Payer: Self-pay | Admitting: Cardiology

## 2013-05-29 VITALS — BP 157/87 | HR 57 | Ht 72.0 in | Wt 201.0 lb

## 2013-05-29 DIAGNOSIS — Z87891 Personal history of nicotine dependence: Secondary | ICD-10-CM | POA: Insufficient documentation

## 2013-05-29 DIAGNOSIS — E785 Hyperlipidemia, unspecified: Secondary | ICD-10-CM | POA: Insufficient documentation

## 2013-05-29 DIAGNOSIS — R002 Palpitations: Secondary | ICD-10-CM | POA: Diagnosis not present

## 2013-05-29 DIAGNOSIS — Z951 Presence of aortocoronary bypass graft: Secondary | ICD-10-CM | POA: Insufficient documentation

## 2013-05-29 DIAGNOSIS — R0602 Shortness of breath: Secondary | ICD-10-CM

## 2013-05-29 DIAGNOSIS — I10 Essential (primary) hypertension: Secondary | ICD-10-CM | POA: Insufficient documentation

## 2013-05-29 DIAGNOSIS — R0989 Other specified symptoms and signs involving the circulatory and respiratory systems: Principal | ICD-10-CM | POA: Insufficient documentation

## 2013-05-29 DIAGNOSIS — I472 Ventricular tachycardia, unspecified: Secondary | ICD-10-CM | POA: Insufficient documentation

## 2013-05-29 DIAGNOSIS — I4729 Other ventricular tachycardia: Secondary | ICD-10-CM | POA: Diagnosis not present

## 2013-05-29 DIAGNOSIS — I4891 Unspecified atrial fibrillation: Secondary | ICD-10-CM | POA: Insufficient documentation

## 2013-05-29 DIAGNOSIS — R0609 Other forms of dyspnea: Secondary | ICD-10-CM | POA: Insufficient documentation

## 2013-05-29 DIAGNOSIS — I2510 Atherosclerotic heart disease of native coronary artery without angina pectoris: Secondary | ICD-10-CM | POA: Insufficient documentation

## 2013-05-29 DIAGNOSIS — Z8249 Family history of ischemic heart disease and other diseases of the circulatory system: Secondary | ICD-10-CM | POA: Insufficient documentation

## 2013-05-29 DIAGNOSIS — I4510 Unspecified right bundle-branch block: Secondary | ICD-10-CM | POA: Insufficient documentation

## 2013-05-29 MED ORDER — TECHNETIUM TC 99M SESTAMIBI GENERIC - CARDIOLITE
11.0000 | Freq: Once | INTRAVENOUS | Status: AC | PRN
  Administered 2013-05-29: 11 via INTRAVENOUS

## 2013-05-29 MED ORDER — TECHNETIUM TC 99M SESTAMIBI GENERIC - CARDIOLITE
33.0000 | Freq: Once | INTRAVENOUS | Status: AC | PRN
  Administered 2013-05-29: 33 via INTRAVENOUS

## 2013-05-29 NOTE — Progress Notes (Signed)
[facility] [address] [place], [place] [address] [phone]    Cardiology Nuclear Med Study  [name] [name] is a 79 y.o. male     MRN : [id number]     DOB: 1934/10/12  Procedure Date: 05/29/2013  Nuclear Med Background Indication for Stress Test:  Evaluation for Ischemia, Graft Patency and Assess Chronotropic Incompetence History:  CAD, Cath, CABG, Afib/VT, Echo 2013 (mild AS) EF 55-60%, and 07-17-03  MPI: Normal, EF=59% ([facility]) Cardiac Risk Factors: Family History - CAD, History of Smoking, Hypertension, Lipids and RBBB  Symptoms:  DOE, Palpitations and fullness in throat   Nuclear Pre-Procedure Caffeine/Decaff Intake:  None > 12 hrs NPO After: 5:30pm   Lungs:  clear O2 Sat: 95% on room air. IV 0.9% NS with Angio Cath:  22g  IV Site: R Antecubital x 1, tolerated well IV Started by:  Irven Baltimore, RN  Chest Size (in):  42 Cup Size: n/a  Height: 6' (1.829 m)  Weight:  201 lb (91.173 kg)  BMI:  Body mass index is 27.25 kg/(m^2). Tech Comments:  Held Fiorcet > 12 hrs. Patient took amiodarone this am.    Nuclear Med Study 1 or 2 day study: 1 day  Stress Test Type:  Stress  Reading MD: N/A  Order Authorizing Provider:  Virl Axe, MD  Resting Radionuclide: Technetium 77m Sestamibi  Resting Radionuclide Dose: 11.0 mCi   Stress Radionuclide:  Technetium 57m Sestamibi  Stress Radionuclide Dose: 33.0 mCi           Stress Protocol Rest HR: 57 Stress HR: 133  Rest BP: 157/87 Stress BP: 159/77  Exercise Time (min): 7:00 METS: 8.5           Dose of Adenosine (mg):  n/a Dose of Lexiscan: n/a mg  Dose of Atropine (mg): n/a Dose of Dobutamine: n/a mcg/kg/min (at max HR)  Stress Test Technologist: Glade Lloyd, BS-ES  Nuclear Technologist:  Charlton Amor, CNMT     Rest Procedure:  Myocardial perfusion imaging was performed at rest 45 minutes following the intravenous administration of Technetium 25m Sestamibi. Rest ECG: Sinus  bradycardia, RBBB.  Stress Procedure:  The patient exercised on the treadmill utilizing the Bruce Protocol for 7:00 minutes. The patient stopped due to fatigue and denied any chest pain.  Technetium 75m Sestamibi was injected at peak exercise and myocardial perfusion imaging was performed after a brief delay. Stress ECG: No significant ST segment change suggestive of ischemia.  QPS Raw Data Images:  Acquisition technically good; LVE. Stress Images:  Normal homogeneous uptake in all areas of the myocardium. Rest Images:  Normal homogeneous uptake in all areas of the myocardium. Subtraction (SDS):  No evidence of ischemia. Transient Ischemic Dilatation (Normal <1.22):  0.83 Lung/Heart Ratio (Normal <0.45):  0.34  Quantitative Gated Spect Images QGS EDV:  105 ml QGS ESV:  40 ml  Impression Exercise Capacity:  Fair exercise capacity. BP Response:  Normal blood pressure response. Clinical Symptoms:  No chest pain or dyspnea. ECG Impression:  No significant ST segment change suggestive of ischemia. Comparison with Prior Nuclear Study: No images to compare  Overall Impression:  Normal stress nuclear study.  LV Ejection Fraction: 61%.  LV Wall Motion:  NL LV Function; NL Wall Motion [name]

## 2013-05-30 ENCOUNTER — Telehealth: Payer: Self-pay | Admitting: Internal Medicine

## 2013-05-30 NOTE — Telephone Encounter (Signed)
Discussed with pt his lab results and what each stands for. He was having difficulty in MyChart understanding what he was looking at and didn't know what TSH/HFP meant - so I educated him on these labs. He verbalized understanding and agreeable to plan.

## 2013-05-30 NOTE — Telephone Encounter (Signed)
New Problem:  Pt is requesting a call back w/ test results. Pt states someone called and told him "everything was good" but he wants to know more details about what all was checked.

## 2013-06-04 ENCOUNTER — Ambulatory Visit (HOSPITAL_COMMUNITY): Payer: Medicare Other | Attending: Internal Medicine | Admitting: Radiology

## 2013-06-04 ENCOUNTER — Encounter: Payer: Self-pay | Admitting: Internal Medicine

## 2013-06-04 DIAGNOSIS — Z87891 Personal history of nicotine dependence: Secondary | ICD-10-CM | POA: Diagnosis not present

## 2013-06-04 DIAGNOSIS — R079 Chest pain, unspecified: Secondary | ICD-10-CM | POA: Insufficient documentation

## 2013-06-04 DIAGNOSIS — I359 Nonrheumatic aortic valve disorder, unspecified: Secondary | ICD-10-CM | POA: Diagnosis not present

## 2013-06-04 DIAGNOSIS — I4729 Other ventricular tachycardia: Secondary | ICD-10-CM | POA: Diagnosis not present

## 2013-06-04 DIAGNOSIS — E785 Hyperlipidemia, unspecified: Secondary | ICD-10-CM | POA: Diagnosis not present

## 2013-06-04 DIAGNOSIS — I079 Rheumatic tricuspid valve disease, unspecified: Secondary | ICD-10-CM | POA: Diagnosis not present

## 2013-06-04 DIAGNOSIS — I10 Essential (primary) hypertension: Secondary | ICD-10-CM | POA: Insufficient documentation

## 2013-06-04 DIAGNOSIS — I2510 Atherosclerotic heart disease of native coronary artery without angina pectoris: Secondary | ICD-10-CM | POA: Diagnosis not present

## 2013-06-04 DIAGNOSIS — I472 Ventricular tachycardia, unspecified: Secondary | ICD-10-CM | POA: Insufficient documentation

## 2013-06-04 DIAGNOSIS — I080 Rheumatic disorders of both mitral and aortic valves: Secondary | ICD-10-CM | POA: Diagnosis not present

## 2013-06-04 DIAGNOSIS — I4891 Unspecified atrial fibrillation: Secondary | ICD-10-CM | POA: Diagnosis not present

## 2013-06-04 NOTE — Progress Notes (Signed)
Echocardiogram performed.  

## 2013-06-07 ENCOUNTER — Telehealth: Payer: Self-pay | Admitting: Internal Medicine

## 2013-06-07 NOTE — Telephone Encounter (Signed)
New message ° ° °Patient calling regarding test results.   °

## 2013-06-07 NOTE — Telephone Encounter (Signed)
Pt would like results of ECHO performed on 2/10.  Informed him the ECHO has not yet been reviewed by Dr. Caryl Comes.  He would like someone to call him with results when reviewed.

## 2013-06-12 ENCOUNTER — Telehealth: Payer: Self-pay | Admitting: Internal Medicine

## 2013-06-12 NOTE — Telephone Encounter (Signed)
A user error has taken place: encounter opened in error, closed for administrative reasons.   See 2/13 telephone note

## 2013-06-12 NOTE — Telephone Encounter (Signed)
Echo results reviewed w/ patient. Patient verbalized understanding.

## 2013-06-12 NOTE — Telephone Encounter (Signed)
Follow up     Pt would like echo results

## 2013-06-12 NOTE — Telephone Encounter (Signed)
New Problem:  Pt is calling to hear the results of his recent test.

## 2013-06-13 ENCOUNTER — Other Ambulatory Visit: Payer: Self-pay

## 2013-06-13 DIAGNOSIS — I350 Nonrheumatic aortic (valve) stenosis: Secondary | ICD-10-CM

## 2013-06-17 ENCOUNTER — Ambulatory Visit (INDEPENDENT_AMBULATORY_CARE_PROVIDER_SITE_OTHER): Payer: Medicare Other | Admitting: Internal Medicine

## 2013-06-17 DIAGNOSIS — R0989 Other specified symptoms and signs involving the circulatory and respiratory systems: Secondary | ICD-10-CM

## 2013-06-17 DIAGNOSIS — R0609 Other forms of dyspnea: Secondary | ICD-10-CM | POA: Diagnosis not present

## 2013-06-17 LAB — PULMONARY FUNCTION TEST
DL/VA % PRED: 99 %
DL/VA: 4.6 ml/min/mmHg/L
DLCO unc % pred: 83 %
DLCO unc: 27.52 ml/min/mmHg
FEF 25-75 Post: 3.39 L/sec
FEF 25-75 Pre: 2.96 L/sec
FEF2575-%CHANGE-POST: 14 %
FEF2575-%PRED-PRE: 135 %
FEF2575-%Pred-Post: 155 %
FEV1-%CHANGE-POST: 1 %
FEV1-%PRED-PRE: 109 %
FEV1-%Pred-Post: 111 %
FEV1-POST: 3.42 L
FEV1-Pre: 3.36 L
FEV1FVC-%CHANGE-POST: 2 %
FEV1FVC-%Pred-Pre: 113 %
FEV6-%Change-Post: -1 %
FEV6-%Pred-Post: 101 %
FEV6-%Pred-Pre: 102 %
FEV6-PRE: 4.08 L
FEV6-Post: 4.04 L
FEV6FVC-%Change-Post: 0 %
FEV6FVC-%PRED-PRE: 105 %
FEV6FVC-%Pred-Post: 105 %
FVC-%Change-Post: 0 %
FVC-%Pred-Post: 95 %
FVC-%Pred-Pre: 96 %
FVC-Post: 4.08 L
FVC-Pre: 4.11 L
POST FEV1/FVC RATIO: 84 %
POST FEV6/FVC RATIO: 99 %
Pre FEV1/FVC ratio: 82 %
Pre FEV6/FVC Ratio: 99 %
RV % pred: 77 %
RV: 2.03 L
TLC % PRED: 94 %
TLC: 6.78 L

## 2013-06-17 NOTE — Progress Notes (Signed)
PFT done today. 

## 2013-06-18 ENCOUNTER — Ambulatory Visit: Payer: Medicare Other | Admitting: Internal Medicine

## 2013-06-19 ENCOUNTER — Ambulatory Visit (INDEPENDENT_AMBULATORY_CARE_PROVIDER_SITE_OTHER): Payer: Medicare Other | Admitting: Pharmacist

## 2013-06-19 DIAGNOSIS — Z7901 Long term (current) use of anticoagulants: Secondary | ICD-10-CM

## 2013-06-19 DIAGNOSIS — I4891 Unspecified atrial fibrillation: Secondary | ICD-10-CM | POA: Diagnosis not present

## 2013-06-19 LAB — POCT INR: INR: 2.3

## 2013-06-25 ENCOUNTER — Telehealth: Payer: Self-pay | Admitting: Internal Medicine

## 2013-06-25 NOTE — Telephone Encounter (Signed)
Pt has a pulmonary function test done and would like to know if we have the results, pls call

## 2013-06-26 NOTE — Telephone Encounter (Signed)
Explained that results were WNL. Pt has appt tomorrow and will discuss results in greater detail then. Pt agreeable to plan.

## 2013-06-27 ENCOUNTER — Encounter: Payer: Self-pay | Admitting: Internal Medicine

## 2013-06-27 ENCOUNTER — Ambulatory Visit (INDEPENDENT_AMBULATORY_CARE_PROVIDER_SITE_OTHER): Payer: Medicare Other | Admitting: Internal Medicine

## 2013-06-27 VITALS — BP 128/80 | HR 90 | Ht 72.0 in | Wt 207.0 lb

## 2013-06-27 DIAGNOSIS — I4891 Unspecified atrial fibrillation: Secondary | ICD-10-CM

## 2013-06-27 DIAGNOSIS — I25810 Atherosclerosis of coronary artery bypass graft(s) without angina pectoris: Secondary | ICD-10-CM | POA: Diagnosis not present

## 2013-06-27 NOTE — Progress Notes (Signed)
Patient Care Team: Tonia Ghent, MD as PCP - General (Family Medicine)   HPI  [name] [name] is a 79 y.o. male Seen in followup for atrial fibrillation and VT been treated with amiodarone. Thromboembolic risk factors are notable for a chads vas score of 3   He underwent monitoring after initial visit to clarify the presence of atrial fibrillation.This prompted reinitiation of his warfarin and his amiodarone.Intercurrently he has seen Dr. Rayann Heman to consider catheter ablation but decided that he would continue on amiodarone presently   Echocardiogram demonstrated mild aortic stenosis and left atrial enlargement with normal left ventricular function this is most recently checked 4/13  He has a history of ischemic heart disease with prior bypass surgery 2004 Catheterization 2007 demonstrated patent grafts  He has had a problem with a cough. His ARB was changed from cozaaar >> benicar last year. He has been on amiodarone for about 7 years. He also has a sensation of a fullness in his throat that sometimes "blows up like a balloon". It is not associated with the brackish taste.there have been no significant shortness of breath.  Pulmonary function testing has been done annually for thelast 3 years.DLCO's have been in sequence, 81, 87, and 77%.    He has had problems recently with dyspnea walking back up his driveway. This has been unassociated with chest pain. There has been no peripheral edema.  He's also had problems with recurrent palpitations are two types , the first is rhythmic loss of pulse most consistent I think with PVCs and the other is recurrent atrial fibrillation  he uptitrated his amiodarone from 100-910 200 mg a day.  He also took pulmonary function testing and echocardiogram to look aortic stenosis given his dyspnea  DLCO was 83% predicted max and essentially unchanged from 2013 Aortic valve demonstrated a mean gradient of 20 at peak gradient of 31 a Myoview scan was  negative  His dyspnea is very short lived and not assoc with exertion  Past Medical History  Diagnosis Date  . HTN (hypertension)   . Paroxysmal atrial fibrillation   . Mild aortic stenosis   . Coronary artery disease CARDIOLOGIST-  DR Camie Hauss/ [facility]    Prior CABG 2004, patent grafts 2007, ejection fraction 45%  . S/P CABG x 5   . History of ischemic heart disease   . 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 Dyslipidemia   . Anticoagulated on Coumadin   . GERD (gastroesophageal reflux disease)   . PONV (postoperative nausea and vomiting)   . Chronic cough     NO CARDIAC OR PULMONARY RELATED  . Bladder neoplasm   . Arthritis   . Dry eyes     Past Surgical History  Procedure Laterality Date  . Electrophysiology study  07-27-2005  DR Cristopher Peru    MAPPING --   RESULT NONINDUCIBLE VT OR SVT/  DX RIGHT VENTRICULAR OUTFLOW TRACT VT AND RULES OUT MORE MALIGNANT CAUSES OF VT  . Lumbar disc surgery  1999    L4 -- L5  . Inguinal hernia repair Bilateral 1954  &  1990  . Knee arthroscopy Right 1994  . Coronary artery bypass graft  03-08-2003  DR [id number]    LIMA TO LAD/ SVG TO OM2/  SVG TO DIAGONAL / SVG TO PDA & PLA  . Removal vocal cord polyps  1978  . Transthoracic echocardiogram  08-08-2011    MILD LVH/  EF 55-60%/  GRADE I  DIASTOLIC DYSFUNCTION/ MILD AV STENOSIS  . Cardiac catheterization  07-26-2005  DR GAMBLE    PRESERVED LVF/  EF 50%/ PATENT GRAFTS  . Cardiac catheterization  03-04-2003  DR GAMBLE    SEVERE 3 VESSEL DISEASE  . Cardiac catheterization  [place]    PAF/ FALSE POSITIVE STRESS TEST  . Cataract extraction w/ intraocular lens implant Right   . Tonsillectomy  AS CHILD  . Cystoscopy with biopsy N/A 12/25/2012    Procedure: CYSTOSCOPY WITH BLADDER BIOPSY;  Surgeon: Fredricka Bonine, MD;  Location: [facility];  Service: Urology;  Laterality: N/A;    Current Outpatient Prescriptions  Medication Sig  Dispense Refill  . amiodarone (PACERONE) 200 MG tablet Take 100 mg by mouth every morning.       . butalbital-acetaminophen-caffeine (FIORICET, ESGIC) 50-325-40 MG per tablet Take 1 tablet by mouth as needed.      . cetirizine (ZYRTEC) 10 MG tablet Take 10 mg by mouth daily.      . Cholecalciferol (VITAMIN D3) 2000 UNITS TABS Take 1 capsule by mouth every evening.      . hydrochlorothiazide (HYDRODIURIL) 25 MG tablet TAKE 1 TABLET BY MOUTH DAILY  90 tablet  0  . losartan (COZAAR) 100 MG tablet Take 100 mg by mouth every morning.       Vladimir Faster Glycol-Propyl Glycol (SYSTANE ULTRA OP) Apply to eye as needed.      . potassium chloride (K-DUR) 10 MEQ tablet Take 10 mEq by mouth every morning. Take as directed      . pravastatin (PRAVACHOL) 80 MG tablet Take 80 mg by mouth every evening.       . warfarin (COUMADIN) 5 MG tablet Take 1-1.5 tablets (5-7.5 mg total) by mouth daily. Take as directed by anticoagulation clinic--  7.5MG  M/W/F/SAT   AND  5 MG THE OTHER DAYS       No current facility-administered medications for this visit.    Allergies  Allergen Reactions  . Ace Inhibitors Other (See Comments)    COUGH  . Hycodan [Hydrocodone-Homatropine] Nausea And Vomiting  . Zocor [Simvastatin] Other (See Comments)    MYALGIA  . Cephalexin Rash    Review of Systems negative except from HPI and PMH  Physical Exam BP 128/80  Pulse 90  Ht 6' (1.829 m)  Wt 207 lb (93.895 kg)  BMI 28.07 kg/m2      Assessment and  Plan  Aortic stenosis  Moderate   Atrial fibrillation  Ischemic heart disease  S/p CABG  With near normal LV function  The patient has mild/moderate aortic stenosis but does not have any clearly attributable symptoms. I suggest that we follow him up in a year with an echo at that time. His DLCO is also stable in the context of amiodarone.

## 2013-07-04 ENCOUNTER — Other Ambulatory Visit: Payer: Self-pay | Admitting: Internal Medicine

## 2013-07-25 ENCOUNTER — Other Ambulatory Visit: Payer: Self-pay | Admitting: Dermatology

## 2013-07-25 DIAGNOSIS — Z85828 Personal history of other malignant neoplasm of skin: Secondary | ICD-10-CM | POA: Diagnosis not present

## 2013-07-25 DIAGNOSIS — C44319 Basal cell carcinoma of skin of other parts of face: Secondary | ICD-10-CM | POA: Diagnosis not present

## 2013-07-25 DIAGNOSIS — D235 Other benign neoplasm of skin of trunk: Secondary | ICD-10-CM | POA: Diagnosis not present

## 2013-07-25 DIAGNOSIS — L570 Actinic keratosis: Secondary | ICD-10-CM | POA: Diagnosis not present

## 2013-07-25 DIAGNOSIS — L820 Inflamed seborrheic keratosis: Secondary | ICD-10-CM | POA: Diagnosis not present

## 2013-07-25 DIAGNOSIS — L821 Other seborrheic keratosis: Secondary | ICD-10-CM | POA: Diagnosis not present

## 2013-07-31 ENCOUNTER — Ambulatory Visit (INDEPENDENT_AMBULATORY_CARE_PROVIDER_SITE_OTHER): Payer: Medicare Other | Admitting: *Deleted

## 2013-07-31 DIAGNOSIS — Z5181 Encounter for therapeutic drug level monitoring: Secondary | ICD-10-CM

## 2013-07-31 DIAGNOSIS — Z7901 Long term (current) use of anticoagulants: Secondary | ICD-10-CM | POA: Diagnosis not present

## 2013-07-31 DIAGNOSIS — I4891 Unspecified atrial fibrillation: Secondary | ICD-10-CM | POA: Diagnosis not present

## 2013-07-31 LAB — POCT INR: INR: 3.2

## 2013-08-14 ENCOUNTER — Ambulatory Visit (INDEPENDENT_AMBULATORY_CARE_PROVIDER_SITE_OTHER): Payer: Medicare Other | Admitting: *Deleted

## 2013-08-14 DIAGNOSIS — Z7901 Long term (current) use of anticoagulants: Secondary | ICD-10-CM | POA: Diagnosis not present

## 2013-08-14 DIAGNOSIS — I4891 Unspecified atrial fibrillation: Secondary | ICD-10-CM | POA: Diagnosis not present

## 2013-08-14 DIAGNOSIS — Z5181 Encounter for therapeutic drug level monitoring: Secondary | ICD-10-CM | POA: Diagnosis not present

## 2013-08-14 LAB — POCT INR: INR: 2.9

## 2013-08-15 ENCOUNTER — Other Ambulatory Visit: Payer: Self-pay | Admitting: Internal Medicine

## 2013-09-02 ENCOUNTER — Ambulatory Visit (INDEPENDENT_AMBULATORY_CARE_PROVIDER_SITE_OTHER): Payer: Medicare Other | Admitting: *Deleted

## 2013-09-02 DIAGNOSIS — Z7901 Long term (current) use of anticoagulants: Secondary | ICD-10-CM | POA: Diagnosis not present

## 2013-09-02 DIAGNOSIS — Z5181 Encounter for therapeutic drug level monitoring: Secondary | ICD-10-CM | POA: Diagnosis not present

## 2013-09-02 DIAGNOSIS — I4891 Unspecified atrial fibrillation: Secondary | ICD-10-CM

## 2013-09-02 LAB — POCT INR: INR: 2.3

## 2013-09-30 ENCOUNTER — Ambulatory Visit (INDEPENDENT_AMBULATORY_CARE_PROVIDER_SITE_OTHER): Payer: Medicare Other

## 2013-09-30 DIAGNOSIS — Z7901 Long term (current) use of anticoagulants: Secondary | ICD-10-CM | POA: Diagnosis not present

## 2013-09-30 DIAGNOSIS — N138 Other obstructive and reflux uropathy: Secondary | ICD-10-CM | POA: Diagnosis not present

## 2013-09-30 DIAGNOSIS — I4891 Unspecified atrial fibrillation: Secondary | ICD-10-CM | POA: Diagnosis not present

## 2013-09-30 DIAGNOSIS — N200 Calculus of kidney: Secondary | ICD-10-CM | POA: Diagnosis not present

## 2013-09-30 DIAGNOSIS — Z5181 Encounter for therapeutic drug level monitoring: Secondary | ICD-10-CM

## 2013-09-30 DIAGNOSIS — N401 Enlarged prostate with lower urinary tract symptoms: Secondary | ICD-10-CM | POA: Diagnosis not present

## 2013-09-30 DIAGNOSIS — N139 Obstructive and reflux uropathy, unspecified: Secondary | ICD-10-CM | POA: Diagnosis not present

## 2013-09-30 LAB — POCT INR: INR: 2.1

## 2013-11-04 ENCOUNTER — Ambulatory Visit (INDEPENDENT_AMBULATORY_CARE_PROVIDER_SITE_OTHER): Payer: Medicare Other | Admitting: *Deleted

## 2013-11-04 DIAGNOSIS — Z5181 Encounter for therapeutic drug level monitoring: Secondary | ICD-10-CM | POA: Diagnosis not present

## 2013-11-04 DIAGNOSIS — Z7901 Long term (current) use of anticoagulants: Secondary | ICD-10-CM

## 2013-11-04 DIAGNOSIS — I4891 Unspecified atrial fibrillation: Secondary | ICD-10-CM

## 2013-11-04 LAB — POCT INR: INR: 2.5

## 2013-11-08 DIAGNOSIS — R109 Unspecified abdominal pain: Secondary | ICD-10-CM | POA: Diagnosis not present

## 2013-11-08 DIAGNOSIS — N200 Calculus of kidney: Secondary | ICD-10-CM | POA: Diagnosis not present

## 2013-11-08 DIAGNOSIS — N201 Calculus of ureter: Secondary | ICD-10-CM | POA: Diagnosis not present

## 2013-11-08 DIAGNOSIS — N1330 Unspecified hydronephrosis: Secondary | ICD-10-CM | POA: Diagnosis not present

## 2013-11-11 DIAGNOSIS — N201 Calculus of ureter: Secondary | ICD-10-CM | POA: Diagnosis not present

## 2013-11-11 DIAGNOSIS — N1330 Unspecified hydronephrosis: Secondary | ICD-10-CM | POA: Diagnosis not present

## 2013-12-11 DIAGNOSIS — N200 Calculus of kidney: Secondary | ICD-10-CM | POA: Diagnosis not present

## 2013-12-13 ENCOUNTER — Ambulatory Visit (INDEPENDENT_AMBULATORY_CARE_PROVIDER_SITE_OTHER): Payer: Medicare Other | Admitting: Family Medicine

## 2013-12-13 ENCOUNTER — Encounter: Payer: Self-pay | Admitting: Family Medicine

## 2013-12-13 VITALS — BP 122/78 | HR 66 | Temp 97.8°F | Wt 196.5 lb

## 2013-12-13 DIAGNOSIS — I25810 Atherosclerosis of coronary artery bypass graft(s) without angina pectoris: Secondary | ICD-10-CM | POA: Diagnosis not present

## 2013-12-13 DIAGNOSIS — R21 Rash and other nonspecific skin eruption: Secondary | ICD-10-CM

## 2013-12-13 MED ORDER — FLUOCINONIDE 0.1 % EX CREA: TOPICAL_CREAM | CUTANEOUS | Status: DC

## 2013-12-13 NOTE — Progress Notes (Signed)
Pre visit review using our clinic review tool, if applicable. No additional management support is needed unless otherwise documented below in the visit note.  Was pulling weeds/grass/holly a few days ago.  Itching.  Red rash on L arm.  No R arm sx.  No fevers.  L arm rash isn't painful.   TAC 0.1% didn't help much  Calamine didn't help.    Meds, vitals, and allergies reviewed.   ROS: See HPI.  Otherwise, noncontributory.  nad Rash on L arm, irregular pattern, linear markings.  Blanching.  No palmar lesions.

## 2013-12-13 NOTE — Patient Instructions (Signed)
Use the cream and this should get better. Take care.  Glad to see you.

## 2013-12-16 DIAGNOSIS — R21 Rash and other nonspecific skin eruption: Secondary | ICD-10-CM | POA: Insufficient documentation

## 2013-12-16 NOTE — Assessment & Plan Note (Signed)
Likely rhus dermatitis or similar, use fluocinonide with cautions, f/u prn.  Doesn't look infected.  D/w pt.

## 2013-12-17 ENCOUNTER — Ambulatory Visit (INDEPENDENT_AMBULATORY_CARE_PROVIDER_SITE_OTHER): Payer: Medicare Other | Admitting: *Deleted

## 2013-12-17 DIAGNOSIS — I4891 Unspecified atrial fibrillation: Secondary | ICD-10-CM

## 2013-12-17 DIAGNOSIS — Z5181 Encounter for therapeutic drug level monitoring: Secondary | ICD-10-CM | POA: Diagnosis not present

## 2013-12-17 DIAGNOSIS — Z7901 Long term (current) use of anticoagulants: Secondary | ICD-10-CM | POA: Diagnosis not present

## 2013-12-17 LAB — POCT INR: INR: 1.9

## 2013-12-18 DIAGNOSIS — H02839 Dermatochalasis of unspecified eye, unspecified eyelid: Secondary | ICD-10-CM | POA: Diagnosis not present

## 2013-12-18 DIAGNOSIS — Z961 Presence of intraocular lens: Secondary | ICD-10-CM | POA: Diagnosis not present

## 2013-12-18 DIAGNOSIS — Z09 Encounter for follow-up examination after completed treatment for conditions other than malignant neoplasm: Secondary | ICD-10-CM | POA: Diagnosis not present

## 2013-12-18 DIAGNOSIS — H43819 Vitreous degeneration, unspecified eye: Secondary | ICD-10-CM | POA: Diagnosis not present

## 2013-12-18 DIAGNOSIS — H35379 Puckering of macula, unspecified eye: Secondary | ICD-10-CM | POA: Diagnosis not present

## 2013-12-18 DIAGNOSIS — H2510 Age-related nuclear cataract, unspecified eye: Secondary | ICD-10-CM | POA: Diagnosis not present

## 2013-12-18 DIAGNOSIS — Z049 Encounter for examination and observation for unspecified reason: Secondary | ICD-10-CM | POA: Diagnosis not present

## 2013-12-19 ENCOUNTER — Encounter: Payer: Self-pay | Admitting: Family Medicine

## 2013-12-19 LAB — CHOLESTEROL, TOTAL
ALT: 20 U/L (ref 10–40)
AST: 21 U/L
Alkaline Phosphatase: 64 U/L
Cholesterol: 154 mg/dL (ref 0–200)
Creat: 0.9
Glucose: 95
HDL: 57 mg/dL (ref 35–70)
LDL CALC: 78
PSA: 0.543
TRIGLYCERIDES: 96
TSH: 4.75

## 2014-01-08 ENCOUNTER — Other Ambulatory Visit: Payer: Self-pay | Admitting: Internal Medicine

## 2014-01-09 ENCOUNTER — Other Ambulatory Visit: Payer: Self-pay | Admitting: Internal Medicine

## 2014-01-13 ENCOUNTER — Other Ambulatory Visit: Payer: Self-pay | Admitting: Urology

## 2014-01-13 ENCOUNTER — Telehealth: Payer: Self-pay | Admitting: Cardiovascular Disease

## 2014-01-13 DIAGNOSIS — R3129 Other microscopic hematuria: Secondary | ICD-10-CM | POA: Diagnosis not present

## 2014-01-13 DIAGNOSIS — N201 Calculus of ureter: Secondary | ICD-10-CM | POA: Diagnosis not present

## 2014-01-13 NOTE — Telephone Encounter (Signed)
New problem   Need to know if pt can hold coumadin for 3-5 days prior to lithotripsy on 01/20/14. Please advise

## 2014-01-14 ENCOUNTER — Encounter (HOSPITAL_COMMUNITY): Payer: Self-pay | Admitting: *Deleted

## 2014-01-14 ENCOUNTER — Encounter (HOSPITAL_COMMUNITY): Payer: Self-pay | Admitting: Pharmacy Technician

## 2014-01-14 NOTE — Telephone Encounter (Addendum)
Advised, per Dr. Caryl Comes, ok to hold coumadin 3-5 days prior to procedure. Faxed copy of this note per request. Faxed to [phone]

## 2014-01-20 ENCOUNTER — Ambulatory Visit (HOSPITAL_COMMUNITY): Payer: Medicare Other

## 2014-01-20 ENCOUNTER — Encounter (HOSPITAL_COMMUNITY): Payer: Self-pay | Admitting: *Deleted

## 2014-01-20 ENCOUNTER — Ambulatory Visit (HOSPITAL_COMMUNITY)
Admission: RE | Admit: 2014-01-20 | Discharge: 2014-01-20 | Disposition: A | Discharge status: Home / Self Care | Payer: Medicare Other | Source: Ambulatory Visit | Attending: Urology | Admitting: Urology

## 2014-01-20 ENCOUNTER — Encounter (HOSPITAL_COMMUNITY)
Admission: RE | Disposition: A | Discharge status: Home / Self Care | Payer: Self-pay | Source: Ambulatory Visit | Attending: Urology

## 2014-01-20 DIAGNOSIS — I10 Essential (primary) hypertension: Secondary | ICD-10-CM | POA: Diagnosis not present

## 2014-01-20 DIAGNOSIS — Z87891 Personal history of nicotine dependence: Secondary | ICD-10-CM | POA: Insufficient documentation

## 2014-01-20 DIAGNOSIS — I4891 Unspecified atrial fibrillation: Secondary | ICD-10-CM | POA: Insufficient documentation

## 2014-01-20 DIAGNOSIS — E785 Hyperlipidemia, unspecified: Secondary | ICD-10-CM | POA: Insufficient documentation

## 2014-01-20 DIAGNOSIS — Z79899 Other long term (current) drug therapy: Secondary | ICD-10-CM | POA: Insufficient documentation

## 2014-01-20 DIAGNOSIS — E7800 Pure hypercholesterolemia, unspecified: Secondary | ICD-10-CM | POA: Diagnosis not present

## 2014-01-20 DIAGNOSIS — N209 Urinary calculus, unspecified: Secondary | ICD-10-CM | POA: Diagnosis not present

## 2014-01-20 DIAGNOSIS — N201 Calculus of ureter: Secondary | ICD-10-CM | POA: Diagnosis not present

## 2014-01-20 DIAGNOSIS — Z881 Allergy status to other antibiotic agents status: Secondary | ICD-10-CM | POA: Diagnosis not present

## 2014-01-20 DIAGNOSIS — Z7901 Long term (current) use of anticoagulants: Secondary | ICD-10-CM | POA: Insufficient documentation

## 2014-01-20 DIAGNOSIS — Z888 Allergy status to other drugs, medicaments and biological substances status: Secondary | ICD-10-CM | POA: Insufficient documentation

## 2014-01-20 DIAGNOSIS — R12 Heartburn: Secondary | ICD-10-CM | POA: Diagnosis not present

## 2014-01-20 DIAGNOSIS — M129 Arthropathy, unspecified: Secondary | ICD-10-CM | POA: Insufficient documentation

## 2014-01-20 LAB — PROTIME-INR
INR: 1 (ref 0.00–1.49)
Prothrombin Time: 13.2 seconds (ref 11.6–15.2)

## 2014-01-20 SURGERY — LITHOTRIPSY, ESWL
Anesthesia: LOCAL | Laterality: Right

## 2014-01-20 MED ORDER — DIPHENHYDRAMINE HCL 25 MG PO CAPS
25.0000 mg | ORAL_CAPSULE | ORAL | Status: AC
  Administered 2014-01-20: 25 mg via ORAL
  Filled 2014-01-20: qty 1

## 2014-01-20 MED ORDER — TAMSULOSIN HCL 0.4 MG PO CAPS: 0.4000 mg | ORAL_CAPSULE | Freq: Every day | ORAL | Status: DC

## 2014-01-20 MED ORDER — CIPROFLOXACIN HCL 500 MG PO TABS
500.0000 mg | ORAL_TABLET | ORAL | Status: AC
  Administered 2014-01-20: 500 mg via ORAL
  Filled 2014-01-20: qty 1

## 2014-01-20 MED ORDER — OXYCODONE-ACETAMINOPHEN 5-325 MG PO TABS: 1.0000 | ORAL_TABLET | ORAL | Status: DC | PRN

## 2014-01-20 MED ORDER — WARFARIN SODIUM 5 MG PO TABS: 5.0000 mg | ORAL_TABLET | Freq: Every day | ORAL | Status: DC

## 2014-01-20 MED ORDER — DIAZEPAM 5 MG PO TABS
10.0000 mg | ORAL_TABLET | ORAL | Status: AC
  Administered 2014-01-20: 10 mg via ORAL
  Filled 2014-01-20: qty 2

## 2014-01-20 MED ORDER — SODIUM CHLORIDE 0.9 % IV SOLN
INTRAVENOUS | Status: DC
  Administered 2014-01-20: 11:00:00 via INTRAVENOUS

## 2014-01-20 NOTE — Op Note (Signed)
Right distal ureteral stone, 12 mm  Right ESWL - see [facility] full op note

## 2014-01-20 NOTE — H&P (Signed)
Reason For Visit Right flank pain   History of Present Illness Patient presents today as an urgent work in for worsening right flank and groin pain. The patient has a known large 12 mm right ureteral stone that was relatively asymptomatic over the past month, and he was happy with medical expulsion therapy. However, over the last 24 hours the patient has developed significant right groin and flank pain. The pain radiates down to his testicle. He was unable to sleep last night. The patient has been taking Percocet which makes him nauseous. He is eager to take steps to get this taken care of. The patient denies any gross hematuria or fevers. Notably, the patient is on Coumadin.   Past Medical History Problems  1. History of Acute Bacterial Endocarditis (421.0) 2. History of Arthritis (V13.4) 3. History of Atrial fibrillation (427.31) 4. History of Atrial fibrillation (427.31) 5. History of cardiac disorder (V12.50) 6. History of heartburn (V12.79) 7. History of hypercholesterolemia (V12.29) 8. History of hypercholesterolemia (V12.29) 9. History of hypertension (V12.59) 10. History of mitral valve prolapse (V12.59)  Surgical History Problems  1. History of Back Surgery 2. History of Cystoscopy With Fulguration Minor Lesion (Under 34mm) 3. History of Heart Surgery 4. History of Hernia Repair 5. History of Knee Arthroscopy  Current Meds 1. Alfuzosin HCl ER 10 MG Oral Tablet Extended Release 24 Hour; Take 1 tablet daily;  Therapy: [id number] to (Last Rx:17Jul2015) Ordered 2. Amiodarone HCl - 100 MG Oral Tablet;  Therapy: (Recorded:11Jul2014) to Recorded 3. Butalbital-ASA-Caffeine 50-325-40 MG Oral Capsule;  Therapy: (Recorded:11Jul2014) to Recorded 4. Hydrochlorothiazide 25 MG Oral Tablet;  Therapy: (Recorded:11Jul2014) to Recorded 5. Hydrocodone-Acetaminophen 7.5-325 MG Oral Tablet; TAKE 1 TO 2 TABLETS EVERY 4  TO 6 HOURS AS NEEDED FOR PAIN;  Therapy: [id number] to  (Evaluate:21Jul2015); Last Rx:17Jul2015 Ordered 6. Losartan Potassium 100 MG Oral Tablet;  Therapy: (Recorded:11Jul2014) to Recorded 7. Omeprazole 20 MG Oral Capsule Delayed Release;  Therapy: (Recorded:08Jun2015) to Recorded 8. Potassium TABS;  Therapy: (Recorded:11Jul2014) to Recorded 9. Pravastatin Sodium 80 MG Oral Tablet;  Therapy: (Recorded:11Jul2014) to Recorded 10. Vitamin D TABS;   Therapy: (Recorded:08Jun2015) to Recorded 11. Warfarin Sodium 5 MG Oral Tablet;   Therapy: (Recorded:11Jul2014) to Recorded 12. Zyrtec TABS;   Therapy: (Recorded:08Jun2015) to Recorded  Allergies Medication  1. Doxycycline Hyclate CAPS 2. SMZ-TMP DS TABS 3. Trimethoprim TABS 4. Cephalexin CAPS  Family History Problems  1. Family history of Amyloidosis : Brother 2. Family history of Cancer : Mother 3. Family history of Cancer : Father 4. Family history of Death In The Family Father : Father   31 accident 75. Family history of Death In The Family Mother : Mother   68 cancer 27. Family history of Family Health Status Number Of Children   3-sons 7. Family history of Nephrolithiasis : Father  Social History Problems  1. Denied: History of Alcohol Use 2. Caffeine Use   1 3. Former smoker (V15.82) 4. Marital History - Currently Married 5. Retired From Work 21. History of Tobacco Use (V15.82)   quit 26 years ago; smoked one pack per day for 30 years  Review of Systems .   Vitals Vital Signs [Data Includes: Last 1 Day]  Recorded: 21Sep2015 11:09AM  Blood Pressure: 152 / 84 Heart Rate: 53  Physical Exam Patient appears to be in moderate distress  Lungs are clear to auscultation bilaterally  Heart rate is regular, no murmur appreciated  There is right CVA tenderness  Mild right lower quadrant and suprapubic tenderness  Results/Data Urine [Data Includes: Last 1 Day]   21Sep2015  COLOR YELLOW   APPEARANCE CLEAR   SPECIFIC GRAVITY 1.010   pH 7.0   GLUCOSE NEG mg/dL   BILIRUBIN NEG   KETONE NEG mg/dL  BLOOD SMALL   PROTEIN NEG mg/dL  UROBILINOGEN 0.2 mg/dL  NITRITE NEG   LEUKOCYTE ESTERASE NEG   SQUAMOUS EPITHELIAL/HPF RARE   WBC 0-2 WBC/hpf  RBC 3-6 RBC/hpf  BACTERIA NONE SEEN   CRYSTALS NONE SEEN   CASTS NONE SEEN    The patient's urinalysis today demonstrates microscopic hematuria with calcium oxide crystals   Assessment The patient continues to struggle with a 12 mm right ureteral stone which is located in the right distal ureter.   Plan Calculus of right ureter  1. Start: Hydrocodone-Acetaminophen 5-325 MG Oral Tablet; TAKE 1 TO 2 TABLETS EVERY  4 TO 6 HOURS AS NEEDED FOR PAIN 2. Start: Ondansetron 4 MG Oral Tablet Dispersible; TAKE 1 TABLET 4 times daily PRN 3. Follow-up Schedule Surgery Office  Follow-up  Status: Hold For - Appointment   Requested for: 21Sep2015 Microscopic hematuria  4. URINE CULTURE; Status:In Progress - Specimen/Data Collected;   Done: 21Sep2015  Discussion/Summary The plan is to stop the patient's Coumadin today and proceed with shockwave lithotripsy in 1 week. The patient will need to check in with his primary care doctor in regards to his Coumadin. The patient is followed by Dr. Junious Silk and as such I will schedule the shockwave with him. I have gone over the risks and benefits of the operation which the patient has agreed to proceed. I did offer the patient to have a stent placed prior to relieve his symptoms which he has declined. The patient will return if the Vicodin and Zofran do not sufficiently treat his symptoms.   Signatures Electronically signed by : Louis Meckel, M.D.; Jan 13 2014 12:18PM EST   Addendum: I spoke with Dr. Louis Meckel about Mr. Duhamel. I also reviewed his KUB today.  Nurse just called so that I could speak with the patient.  I discussed again with him the nature risks and benefits of continued stone passage, shockwave lithotripsy, ureteroscopy.  All questions answered.  He elects to  proceed with shockwave lithotripsy.

## 2014-01-20 NOTE — Interval H&P Note (Signed)
History and Physical Interval Note:  01/20/2014 12:54 PM  [name] [name]  has presented today for surgery, with the diagnosis of right ureteral stone  The various methods of treatment have been discussed with the patient and family. After consideration of risks, benefits and other options for treatment, the patient has consented to  Procedure(s): RIGHT EXTRACORPOREAL SHOCK WAVE LITHOTRIPSY (ESWL) (Right) as a surgical intervention .  The patient's history has been reviewed, patient examined, no change in status, stable for surgery.  I have reviewed the patient's chart and labs.  Questions were answered to the patient's satisfaction.     Festus Aloe

## 2014-01-20 NOTE — Discharge Instructions (Signed)

## 2014-01-21 ENCOUNTER — Telehealth: Payer: Self-pay | Admitting: Family Medicine

## 2014-01-21 NOTE — Telephone Encounter (Signed)
Message copied by Tonia Ghent on Tue Jan 21, 2014  1:49 PM ------      Message from: Emelia Salisbury C      Created: Tue Jan 21, 2014 10:17 AM       Spoke to Brenda/nurse at [facility] and was advised that they got the okay from Dr. Caryl Comes and would leave it up to him to decide when to restart the coumadin. Was advised that Dr. Caryl Comes gave them the okay to stop the coumadin 3-5 days prior to the procedure that was done yesterday.  [name] stated right now nothing else is schedule and that would not be determined until his follow-up appointment.      ----- Message -----         From: Tonia Ghent, MD         Sent: 01/21/2014   9:35 AM           To: Josetta Huddle, CMA            Check with [facility] to see when he can restart coumadin, if another procedure is planned, if he is still passing blood. Thanks.             Brigitte Pulse       ------

## 2014-01-21 NOTE — Telephone Encounter (Signed)
[name] called back, and says that per Dr. Tommie Ard d/c instructions he can restart coumadin on 01/22/14.

## 2014-01-21 NOTE — Telephone Encounter (Signed)
In this case, I will defer to cards about his coumadin.  Thanks.

## 2014-01-22 ENCOUNTER — Ambulatory Visit (INDEPENDENT_AMBULATORY_CARE_PROVIDER_SITE_OTHER): Payer: Medicare Other | Admitting: Family Medicine

## 2014-01-22 ENCOUNTER — Encounter: Payer: Self-pay | Admitting: Family Medicine

## 2014-01-22 VITALS — BP 104/64 | HR 70 | Temp 98.3°F | Wt 196.8 lb

## 2014-01-22 DIAGNOSIS — M549 Dorsalgia, unspecified: Secondary | ICD-10-CM | POA: Insufficient documentation

## 2014-01-22 DIAGNOSIS — I25810 Atherosclerosis of coronary artery bypass graft(s) without angina pectoris: Secondary | ICD-10-CM | POA: Diagnosis not present

## 2014-01-22 DIAGNOSIS — Z23 Encounter for immunization: Secondary | ICD-10-CM | POA: Diagnosis not present

## 2014-01-22 DIAGNOSIS — M545 Low back pain, unspecified: Secondary | ICD-10-CM

## 2014-01-22 MED ORDER — METHOCARBAMOL 500 MG PO TABS: 500.0000 mg | ORAL_TABLET | Freq: Four times a day (QID) | ORAL | Status: DC

## 2014-01-22 NOTE — Assessment & Plan Note (Signed)
Likely benign muscle strain. D/w pt about stretching and local heat. Can try robaxin prn.  F/u prn. No need to image.

## 2014-01-22 NOTE — Progress Notes (Signed)
Pre visit review using our clinic review tool, if applicable. No additional management support is needed unless otherwise documented below in the visit note.  Lithotripsy 2 days ago.  He has a strainer, but no fragments passed yet.  Not much R sided pain.  Back on coumadin.    Now with L sided back pain (not from the stones).  Was sitting down working on refinishing a bathroom. Was squatting over, may have pulled a muscles.  L lower back pain.  Not like the renal stone pain.  Pain rolling over the bed, getting out of a chair.  Occ pain in the L thigh, but not down to the foot.  Has gotten relief with heat locally.  No FCNAVD.  No bruising.  No other injury.    Meds, vitals, and allergies reviewed.   ROS: See HPI.  Otherwise, noncontributory.  nad Mmm occ ectopy noted ctab abd L lower back ttp w/o bruising, no midline pain Normal gait, but pain getting out of chair. L SLR neg for radicular sx but his back is tight.

## 2014-01-22 NOTE — Patient Instructions (Signed)
Heat, gently stretch, and use methocarbamol as needed.  Take care.  Glad to see you.

## 2014-01-22 NOTE — Telephone Encounter (Signed)
Note, thanks.

## 2014-01-28 ENCOUNTER — Ambulatory Visit (INDEPENDENT_AMBULATORY_CARE_PROVIDER_SITE_OTHER): Payer: Medicare Other | Admitting: Pharmacist

## 2014-01-28 DIAGNOSIS — Z7901 Long term (current) use of anticoagulants: Secondary | ICD-10-CM

## 2014-01-28 DIAGNOSIS — Z5181 Encounter for therapeutic drug level monitoring: Secondary | ICD-10-CM | POA: Diagnosis not present

## 2014-01-28 DIAGNOSIS — I4891 Unspecified atrial fibrillation: Secondary | ICD-10-CM | POA: Diagnosis not present

## 2014-01-28 LAB — POCT INR: INR: 1.4

## 2014-02-04 DIAGNOSIS — R109 Unspecified abdominal pain: Secondary | ICD-10-CM | POA: Diagnosis not present

## 2014-02-04 DIAGNOSIS — N201 Calculus of ureter: Secondary | ICD-10-CM | POA: Diagnosis not present

## 2014-02-05 DIAGNOSIS — N201 Calculus of ureter: Secondary | ICD-10-CM | POA: Diagnosis not present

## 2014-02-11 ENCOUNTER — Ambulatory Visit (INDEPENDENT_AMBULATORY_CARE_PROVIDER_SITE_OTHER): Payer: Medicare Other | Admitting: *Deleted

## 2014-02-11 DIAGNOSIS — Z7901 Long term (current) use of anticoagulants: Secondary | ICD-10-CM | POA: Diagnosis not present

## 2014-02-11 DIAGNOSIS — I4891 Unspecified atrial fibrillation: Secondary | ICD-10-CM

## 2014-02-11 DIAGNOSIS — Z5181 Encounter for therapeutic drug level monitoring: Secondary | ICD-10-CM

## 2014-02-11 LAB — POCT INR: INR: 1.8

## 2014-02-25 ENCOUNTER — Ambulatory Visit (INDEPENDENT_AMBULATORY_CARE_PROVIDER_SITE_OTHER): Payer: Medicare Other | Admitting: *Deleted

## 2014-02-25 DIAGNOSIS — I4891 Unspecified atrial fibrillation: Secondary | ICD-10-CM

## 2014-02-25 DIAGNOSIS — Z5181 Encounter for therapeutic drug level monitoring: Secondary | ICD-10-CM

## 2014-02-25 DIAGNOSIS — Z7901 Long term (current) use of anticoagulants: Secondary | ICD-10-CM

## 2014-02-25 LAB — POCT INR: INR: 1.8

## 2014-03-04 ENCOUNTER — Other Ambulatory Visit: Payer: Self-pay | Admitting: Internal Medicine

## 2014-03-11 ENCOUNTER — Ambulatory Visit (INDEPENDENT_AMBULATORY_CARE_PROVIDER_SITE_OTHER): Payer: Medicare Other | Admitting: Pharmacist Clinician (PhC)/ Clinical Pharmacy Specialist

## 2014-03-11 DIAGNOSIS — Z5181 Encounter for therapeutic drug level monitoring: Secondary | ICD-10-CM

## 2014-03-11 DIAGNOSIS — I4891 Unspecified atrial fibrillation: Secondary | ICD-10-CM | POA: Diagnosis not present

## 2014-03-11 DIAGNOSIS — Z7901 Long term (current) use of anticoagulants: Secondary | ICD-10-CM

## 2014-03-11 LAB — POCT INR: INR: 2.2

## 2014-03-25 ENCOUNTER — Other Ambulatory Visit: Payer: Self-pay | Admitting: Dermatology

## 2014-03-25 DIAGNOSIS — L905 Scar conditions and fibrosis of skin: Secondary | ICD-10-CM | POA: Diagnosis not present

## 2014-03-25 DIAGNOSIS — L309 Dermatitis, unspecified: Secondary | ICD-10-CM | POA: Diagnosis not present

## 2014-03-25 DIAGNOSIS — C4441 Basal cell carcinoma of skin of scalp and neck: Secondary | ICD-10-CM | POA: Diagnosis not present

## 2014-03-25 DIAGNOSIS — D485 Neoplasm of uncertain behavior of skin: Secondary | ICD-10-CM | POA: Diagnosis not present

## 2014-03-25 DIAGNOSIS — L570 Actinic keratosis: Secondary | ICD-10-CM | POA: Diagnosis not present

## 2014-03-31 ENCOUNTER — Other Ambulatory Visit: Payer: Self-pay | Admitting: Internal Medicine

## 2014-04-01 DIAGNOSIS — N281 Cyst of kidney, acquired: Secondary | ICD-10-CM | POA: Diagnosis not present

## 2014-04-09 ENCOUNTER — Ambulatory Visit (INDEPENDENT_AMBULATORY_CARE_PROVIDER_SITE_OTHER): Payer: Medicare Other | Admitting: *Deleted

## 2014-04-09 DIAGNOSIS — Z7901 Long term (current) use of anticoagulants: Secondary | ICD-10-CM

## 2014-04-09 DIAGNOSIS — I4891 Unspecified atrial fibrillation: Secondary | ICD-10-CM

## 2014-04-09 DIAGNOSIS — Z5181 Encounter for therapeutic drug level monitoring: Secondary | ICD-10-CM | POA: Diagnosis not present

## 2014-04-09 LAB — POCT INR: INR: 1.6

## 2014-04-23 ENCOUNTER — Ambulatory Visit (INDEPENDENT_AMBULATORY_CARE_PROVIDER_SITE_OTHER): Payer: Medicare Other | Admitting: Surgery

## 2014-04-23 DIAGNOSIS — Z5181 Encounter for therapeutic drug level monitoring: Secondary | ICD-10-CM | POA: Diagnosis not present

## 2014-04-23 DIAGNOSIS — Z7901 Long term (current) use of anticoagulants: Secondary | ICD-10-CM

## 2014-04-23 DIAGNOSIS — I4891 Unspecified atrial fibrillation: Secondary | ICD-10-CM

## 2014-04-23 LAB — POCT INR: INR: 1.6

## 2014-05-07 ENCOUNTER — Ambulatory Visit (INDEPENDENT_AMBULATORY_CARE_PROVIDER_SITE_OTHER): Payer: Medicare Other | Admitting: *Deleted

## 2014-05-07 DIAGNOSIS — Z7901 Long term (current) use of anticoagulants: Secondary | ICD-10-CM

## 2014-05-07 DIAGNOSIS — I4891 Unspecified atrial fibrillation: Secondary | ICD-10-CM | POA: Diagnosis not present

## 2014-05-07 DIAGNOSIS — Z5181 Encounter for therapeutic drug level monitoring: Secondary | ICD-10-CM | POA: Diagnosis not present

## 2014-05-07 LAB — POCT INR: INR: 1.9

## 2014-05-19 ENCOUNTER — Ambulatory Visit (INDEPENDENT_AMBULATORY_CARE_PROVIDER_SITE_OTHER): Payer: Medicare Other

## 2014-05-19 DIAGNOSIS — Z5181 Encounter for therapeutic drug level monitoring: Secondary | ICD-10-CM

## 2014-05-19 DIAGNOSIS — I4891 Unspecified atrial fibrillation: Secondary | ICD-10-CM

## 2014-05-19 DIAGNOSIS — Z7901 Long term (current) use of anticoagulants: Secondary | ICD-10-CM | POA: Diagnosis not present

## 2014-05-19 LAB — POCT INR: INR: 2.1

## 2014-05-27 ENCOUNTER — Other Ambulatory Visit: Payer: Self-pay | Admitting: *Deleted

## 2014-05-27 MED ORDER — WARFARIN SODIUM 5 MG PO TABS: ORAL_TABLET | ORAL | Status: DC

## 2014-05-27 NOTE — Telephone Encounter (Signed)
Refill done as requested 

## 2014-06-10 ENCOUNTER — Ambulatory Visit (INDEPENDENT_AMBULATORY_CARE_PROVIDER_SITE_OTHER): Payer: Medicare Other

## 2014-06-10 ENCOUNTER — Ambulatory Visit (INDEPENDENT_AMBULATORY_CARE_PROVIDER_SITE_OTHER): Payer: Medicare Other | Admitting: Family Medicine

## 2014-06-10 ENCOUNTER — Ambulatory Visit (INDEPENDENT_AMBULATORY_CARE_PROVIDER_SITE_OTHER): Payer: Medicare Other | Admitting: Internal Medicine

## 2014-06-10 ENCOUNTER — Encounter: Payer: Self-pay | Admitting: Family Medicine

## 2014-06-10 VITALS — BP 110/76 | HR 71 | Temp 98.4°F | Wt 198.8 lb

## 2014-06-10 DIAGNOSIS — I4891 Unspecified atrial fibrillation: Secondary | ICD-10-CM

## 2014-06-10 DIAGNOSIS — Z5181 Encounter for therapeutic drug level monitoring: Secondary | ICD-10-CM

## 2014-06-10 DIAGNOSIS — R21 Rash and other nonspecific skin eruption: Secondary | ICD-10-CM

## 2014-06-10 DIAGNOSIS — Z7901 Long term (current) use of anticoagulants: Secondary | ICD-10-CM | POA: Diagnosis not present

## 2014-06-10 LAB — POCT INR: INR: 2

## 2014-06-10 MED ORDER — HYDROXYZINE HCL 10 MG PO TABS: 10.0000 mg | ORAL_TABLET | Freq: Three times a day (TID) | ORAL | Status: DC | PRN

## 2014-06-10 NOTE — Patient Instructions (Signed)
Try hydroxyzine as needed for itching.  Use a lot of hand cream/vaseline in the meantime.  Limit hot showers.  Try not to scratch.  If not better, then ask for Dr. Ledell Peoples input.  Take care.

## 2014-06-10 NOTE — Progress Notes (Signed)
Pre visit review using our clinic review tool, if applicable. No additional management support is needed unless otherwise documented below in the visit note.  Rash, itching, "stinging and burning" on the torso, shoulders.  "Not everywhere at the same time", it can be variable, regional.  Sx are neck down, but not on the soles or palms.  No new meds.  Worse in the last few weeks, when the weather got colder.    Meds, vitals, and allergies reviewed.   ROS: See HPI.  Otherwise, noncontributory.  nad No jaundice Dry skin diffusely.  Blanching red small maculopapular lesions w/o ulceration in nondermatomal distribution scattered on arms and legs and trunk, not on the palms.

## 2014-06-10 NOTE — Assessment & Plan Note (Signed)
Likely from itch/scratch cycle from dry skin.  Try hydroxyzine as needed for itching.  Use a lot of hand cream/vaseline in the meantime.  Limit hot showers.  Try not to scratch.  If not better, then he'll sk for Dr. Ledell Peoples input.  D/w pt.

## 2014-06-11 ENCOUNTER — Ambulatory Visit: Payer: Medicare Other

## 2014-06-13 ENCOUNTER — Ambulatory Visit (HOSPITAL_COMMUNITY): Payer: Medicare Other | Attending: Family Medicine | Admitting: Radiology

## 2014-06-13 DIAGNOSIS — E785 Hyperlipidemia, unspecified: Secondary | ICD-10-CM | POA: Diagnosis not present

## 2014-06-13 DIAGNOSIS — I359 Nonrheumatic aortic valve disorder, unspecified: Secondary | ICD-10-CM | POA: Insufficient documentation

## 2014-06-13 DIAGNOSIS — I10 Essential (primary) hypertension: Secondary | ICD-10-CM | POA: Diagnosis not present

## 2014-06-13 DIAGNOSIS — I350 Nonrheumatic aortic (valve) stenosis: Secondary | ICD-10-CM

## 2014-06-13 DIAGNOSIS — Z87891 Personal history of nicotine dependence: Secondary | ICD-10-CM | POA: Insufficient documentation

## 2014-06-13 DIAGNOSIS — L309 Dermatitis, unspecified: Secondary | ICD-10-CM | POA: Diagnosis not present

## 2014-06-13 NOTE — Progress Notes (Signed)
Echocardiogram performed.  

## 2014-06-26 ENCOUNTER — Telehealth: Payer: Self-pay | Admitting: Internal Medicine

## 2014-06-26 NOTE — Telephone Encounter (Signed)
Informed pt that unfortunately I did not see where his echo had been resulted by the doctor yet but that I would forward this information to Dr. Olin Pia nurse for review and follow up. Informed pt that Dr. Caryl Comes nor his nurse were in the office today. Pt verbalized understanding and was in agreement with this plan.

## 2014-06-26 NOTE — Telephone Encounter (Signed)
New message ° ° ° ° ° °Want echo results °

## 2014-06-27 NOTE — Telephone Encounter (Signed)
Valve no

## 2014-06-27 NOTE — Telephone Encounter (Signed)
Valve no different from year ago Sorry for delay dont know what happened

## 2014-07-01 ENCOUNTER — Telehealth: Payer: Self-pay | Admitting: Internal Medicine

## 2014-07-01 DIAGNOSIS — R42 Dizziness and giddiness: Secondary | ICD-10-CM | POA: Diagnosis not present

## 2014-07-01 NOTE — Telephone Encounter (Signed)
Reviewed results with patient who verbalized understanding. 

## 2014-07-01 NOTE — Telephone Encounter (Signed)
Follow up   Pt called requesting a call back today if possible. Informed pt that the office is working on his recent message. Pt understood However he still requests a call back soon.

## 2014-07-01 NOTE — Telephone Encounter (Signed)
New message      Pt c/o Syncope: STAT if syncope occurred within 30 minutes and pt complains of lightheadedness High Priority if episode of passing out, completely, today or in last 24 hours   Did you pass out today? no 1. When is the last time you passed out? no  2. Has this occurred multiple times?  no  3. Did you have any symptoms prior to passing out?  Pt was out working in the yard---picking up branches and got very dizzy.  All of a suddenly everything was a blur.  They called EMS----EMS said ekg showed a RBBB.  Has pt every been diagnosed with RBBB before?  Please call today

## 2014-07-01 NOTE — Telephone Encounter (Signed)
Informed patient this was not a new finding. Reviewed EMS EKG. Explained he had similar EKG in 04/2013. Patient requested I mail copy to him - of EKG last year. Confirmed home address and mailed per his request. Patient very thankful for helping him with this.

## 2014-07-07 ENCOUNTER — Other Ambulatory Visit: Payer: Self-pay

## 2014-07-07 MED ORDER — HYDROCHLOROTHIAZIDE 25 MG PO TABS: 25.0000 mg | ORAL_TABLET | Freq: Every morning | ORAL | Status: DC

## 2014-07-08 ENCOUNTER — Ambulatory Visit (INDEPENDENT_AMBULATORY_CARE_PROVIDER_SITE_OTHER): Payer: Medicare Other | Admitting: Pharmacist Clinician (PhC)/ Clinical Pharmacy Specialist

## 2014-07-08 DIAGNOSIS — Z7901 Long term (current) use of anticoagulants: Secondary | ICD-10-CM

## 2014-07-08 DIAGNOSIS — Z5181 Encounter for therapeutic drug level monitoring: Secondary | ICD-10-CM | POA: Diagnosis not present

## 2014-07-08 DIAGNOSIS — I4891 Unspecified atrial fibrillation: Secondary | ICD-10-CM

## 2014-07-08 LAB — POCT INR: INR: 1.9

## 2014-07-24 DIAGNOSIS — L570 Actinic keratosis: Secondary | ICD-10-CM | POA: Diagnosis not present

## 2014-07-24 DIAGNOSIS — L568 Other specified acute skin changes due to ultraviolet radiation: Secondary | ICD-10-CM | POA: Diagnosis not present

## 2014-07-29 ENCOUNTER — Ambulatory Visit (INDEPENDENT_AMBULATORY_CARE_PROVIDER_SITE_OTHER): Payer: Medicare Other

## 2014-07-29 DIAGNOSIS — I4891 Unspecified atrial fibrillation: Secondary | ICD-10-CM

## 2014-07-29 DIAGNOSIS — Z5181 Encounter for therapeutic drug level monitoring: Secondary | ICD-10-CM

## 2014-07-29 DIAGNOSIS — Z7901 Long term (current) use of anticoagulants: Secondary | ICD-10-CM

## 2014-07-29 LAB — POCT INR: INR: 2.2

## 2014-08-12 ENCOUNTER — Encounter: Payer: Self-pay | Admitting: Internal Medicine

## 2014-08-12 ENCOUNTER — Ambulatory Visit (INDEPENDENT_AMBULATORY_CARE_PROVIDER_SITE_OTHER): Payer: Medicare Other | Admitting: Internal Medicine

## 2014-08-12 VITALS — BP 130/70 | HR 70 | Ht 72.0 in | Wt 198.0 lb

## 2014-08-12 DIAGNOSIS — I4891 Unspecified atrial fibrillation: Secondary | ICD-10-CM | POA: Diagnosis not present

## 2014-08-12 NOTE — Patient Instructions (Addendum)
Medication Instructions:  Your physician has recommended you make the following change in your medication: Stop Losartan for one month to see if this helps with cough.   Labwork: none  Testing/Procedures: none  Follow-Up:   Your physician wants you to follow-up in: 12 months with Dr. Caryl Comes.  You will receive a reminder letter in the mail two months in advance. If you don't receive a letter, please call our office to schedule the follow-up appointment.

## 2014-08-12 NOTE — Progress Notes (Signed)
Patient Care Team: Tonia Ghent, MD as PCP - General (Family Medicine)   HPI  [name] [name] is a 80 y.o. male Seen in followup for atrial fibrillation and VT been treated with amiodarone. Thromboembolic risk factors are notable for a chads vas score of 3   He underwent monitoring after initial visit to clarify the presence of atrial fibrillation.This prompted reinitiation of his warfarin and his amiodarone.Intercurrently he has seen Dr. Rayann Heman to consider catheter ablation but decided that he would continue on amiodarone presently     He has a history of ischemic heart disease with prior bypass surgery 2004 Catheterization 2007 demonstrated patent grafts  He has had a problem with a cough. His ARB was changed from cozaaar >> benicar last year.   He has been on amiodarone for about 7 years. He also has a sensation of a fullness in his throat that sometimes "blows up like a balloon". It is not associated with the brackish taste.there have been no significant shortness of breath.   Pulmonary function testing has been done annually for thelast 3 years.DLCO's have been in sequence, 81, 87, and 77%.     He also took pulmonary function testing and echocardiogram to look aortic stenosis given his dyspnea  DLCO was 83% predicted max and essentially unchanged from 2013   echocardiogram 2/16 demonstrated mild aortic stenosis with a mean gradient of 17 ; ejection fraction was 55-60%.  His dyspnea is very short lived and not assoc with exertion   he had an episode of presyncope. It was witnessed by a friend of his. He was noted to be quite pale. EMS was called. No abnormalities were noted. Notably they arrived after the event had ended. His wife also had noted nothing specific ; she saw him halfway through. He was not aware of palpitations at the time.  Past Medical History  Diagnosis Date  . HTN (hypertension)   . Paroxysmal atrial fibrillation   . Mild aortic stenosis   .  Coronary artery disease CARDIOLOGIST-  DR Benji Poynter/ [facility]    Prior CABG 2004, patent grafts 2007, ejection fraction 45%  . S/P CABG x 5   . History of ischemic heart disease   . 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 Dyslipidemia   . Anticoagulated on Coumadin   . GERD (gastroesophageal reflux disease)   . PONV (postoperative nausea and vomiting)   . Chronic cough     NO CARDIAC OR PULMONARY RELATED  . Bladder neoplasm   . Arthritis   . Dry eyes   . Dysrhythmia     a fib episodic    Past Surgical History  Procedure Laterality Date  . Electrophysiology study  07-27-2005  DR Cristopher Peru    MAPPING --   RESULT NONINDUCIBLE VT OR SVT/  DX RIGHT VENTRICULAR OUTFLOW TRACT VT AND RULES OUT MORE MALIGNANT CAUSES OF VT  . Lumbar disc surgery  1999    L4 -- L5  . Inguinal hernia repair Bilateral 1954  &  1990  . Knee arthroscopy Right 1994  . Coronary artery bypass graft  03-08-2003  DR YJEHUDJS    LIMA TO LAD/ SVG TO OM2/  SVG TO DIAGONAL / SVG TO PDA & PLA  . Removal vocal cord polyps  1978  . Transthoracic echocardiogram  08-08-2011    MILD LVH/  EF 97-02%/  GRADE I DIASTOLIC DYSFUNCTION/ MILD AV STENOSIS  . Cardiac catheterization  07-26-2005  DR  GAMBLE    PRESERVED LVF/  EF 50%/ PATENT GRAFTS  . Cardiac catheterization  03-04-2003  DR GAMBLE    SEVERE 3 VESSEL DISEASE  . Cardiac catheterization  [place]    PAF/ FALSE POSITIVE STRESS TEST  . Cataract extraction w/ intraocular lens implant Right   . Tonsillectomy  AS CHILD  . Cystoscopy with biopsy N/A 12/25/2012    Procedure: CYSTOSCOPY WITH BLADDER BIOPSY;  Surgeon: Fredricka Bonine, MD;  Location: [facility];  Service: Urology;  Laterality: N/A;    Current Outpatient Prescriptions  Medication Sig Dispense Refill  . acetaminophen (TYLENOL) 325 MG tablet Take 650 mg by mouth once as needed for moderate pain or headache.    Marland Kitchen amiodarone (PACERONE) 200 MG tablet Take  100 mg by mouth every morning.     . butalbital-acetaminophen-caffeine (FIORICET, ESGIC) 50-325-40 MG per tablet Take 1 tablet by mouth once as needed for headache.     . cetirizine (ZYRTEC) 10 MG tablet Take 10 mg by mouth daily.    . cholecalciferol (VITAMIN D) 1000 UNITS tablet Take 1,000 Units by mouth daily.    . hydrochlorothiazide (HYDRODIURIL) 25 MG tablet Take 1 tablet (25 mg total) by mouth every morning. 30 tablet 3  . losartan (COZAAR) 100 MG tablet Take 100 mg by mouth every morning.     Vladimir Faster Glycol-Propyl Glycol (SYSTANE ULTRA OP) Apply 1-2 drops to eye once as needed (dry eyes.).     Marland Kitchen potassium chloride (K-DUR) 10 MEQ tablet Take 10 mEq by mouth every morning. Take as directed    . pravastatin (PRAVACHOL) 80 MG tablet Take 80 mg by mouth every evening.     . warfarin (COUMADIN) 5 MG tablet Take as directed by coumadin clinic 120 tablet 1   No current facility-administered medications for this visit.    Allergies  Allergen Reactions  . Ace Inhibitors Other (See Comments)    COUGH  . Hycodan [Hydrocodone-Homatropine] Nausea And Vomiting  . Zocor [Simvastatin] Other (See Comments)    MYALGIA  . Cephalexin Rash    Review of Systems negative except from HPI and PMH  Physical Exam BP 130/70 mmHg  Pulse 70  Ht 6' (1.829 m)  Wt 198 lb (89.812 kg)  BMI 26.85 kg/m2 Well developed and nourished in no acute distress HENT normal Neck supple with JVP-flat Clear Regular rate and rhythm, no murmurs or gallops Abd-soft with active BS No Clubbing cyanosis edema Skin-warm and dry A & Oriented  Grossly normal sensory and motor function  ECG was ordered today demonstrated sinus rhythm at 70 Intervals 18/13/45 ST segment depression 2, 3, F, V1-V4 with T-wave inversions. Not appreciably different from 2015  Assessment and  Plan  Aortic stenosis  Mild  Atrial fibrillation   presyncope  Ischemic heart disease  S/p CABG  With near normal LV function   repeat echo  demonstrated  Only mild  Aortic stenosis   Atrial fibrillation has been symptomatically infrequent.  He is maintained on warfarin   His presyncopal episode was associated with pallor. It was isolated.  Without symptoms of ischemia

## 2014-08-26 ENCOUNTER — Ambulatory Visit (INDEPENDENT_AMBULATORY_CARE_PROVIDER_SITE_OTHER): Payer: Medicare Other | Admitting: *Deleted

## 2014-08-26 DIAGNOSIS — Z5181 Encounter for therapeutic drug level monitoring: Secondary | ICD-10-CM

## 2014-08-26 DIAGNOSIS — I4891 Unspecified atrial fibrillation: Secondary | ICD-10-CM

## 2014-08-26 DIAGNOSIS — Z7901 Long term (current) use of anticoagulants: Secondary | ICD-10-CM | POA: Diagnosis not present

## 2014-08-26 LAB — POCT INR: INR: 2.1

## 2014-09-23 ENCOUNTER — Telehealth: Payer: Self-pay | Admitting: Internal Medicine

## 2014-09-23 NOTE — Telephone Encounter (Signed)
Stopped Losartan about 6 weeks ago per Dr. Olin Pia instructions - reports cough has improved.  Reports BPs 120-130s. Advised patient to continue current plan, do not restart Losartan, and keep monitoring BPs.  Please call if they begin to maintain higher than 140s. Patient verbalized understanding and agreeable to plan.

## 2014-09-23 NOTE — Addendum Note (Signed)
Addended by: Stanton Kidney on: 09/23/2014 04:10 PM   Modules accepted: Orders, Medications

## 2014-09-23 NOTE — Telephone Encounter (Signed)
New Message  Pt calling to speak w/ rn about Losartin reaction. Please call back and discuss.

## 2014-09-30 ENCOUNTER — Ambulatory Visit (INDEPENDENT_AMBULATORY_CARE_PROVIDER_SITE_OTHER): Payer: Medicare Other | Admitting: *Deleted

## 2014-09-30 DIAGNOSIS — Z7901 Long term (current) use of anticoagulants: Secondary | ICD-10-CM

## 2014-09-30 DIAGNOSIS — I4891 Unspecified atrial fibrillation: Secondary | ICD-10-CM | POA: Diagnosis not present

## 2014-09-30 DIAGNOSIS — Z5181 Encounter for therapeutic drug level monitoring: Secondary | ICD-10-CM

## 2014-09-30 LAB — POCT INR: INR: 2.9

## 2014-10-20 ENCOUNTER — Other Ambulatory Visit: Payer: Self-pay

## 2014-10-21 ENCOUNTER — Ambulatory Visit (INDEPENDENT_AMBULATORY_CARE_PROVIDER_SITE_OTHER): Payer: Medicare Other

## 2014-10-21 DIAGNOSIS — Z5181 Encounter for therapeutic drug level monitoring: Secondary | ICD-10-CM | POA: Diagnosis not present

## 2014-10-21 DIAGNOSIS — Z7901 Long term (current) use of anticoagulants: Secondary | ICD-10-CM | POA: Diagnosis not present

## 2014-10-21 DIAGNOSIS — I4891 Unspecified atrial fibrillation: Secondary | ICD-10-CM | POA: Diagnosis not present

## 2014-10-21 LAB — POCT INR: INR: 1.9

## 2014-11-10 ENCOUNTER — Other Ambulatory Visit: Payer: Self-pay | Admitting: Internal Medicine

## 2014-11-18 ENCOUNTER — Ambulatory Visit (INDEPENDENT_AMBULATORY_CARE_PROVIDER_SITE_OTHER): Payer: Medicare Other | Admitting: *Deleted

## 2014-11-18 DIAGNOSIS — I4891 Unspecified atrial fibrillation: Secondary | ICD-10-CM

## 2014-11-18 DIAGNOSIS — Z7901 Long term (current) use of anticoagulants: Secondary | ICD-10-CM

## 2014-11-18 DIAGNOSIS — Z5181 Encounter for therapeutic drug level monitoring: Secondary | ICD-10-CM | POA: Diagnosis not present

## 2014-11-18 LAB — POCT INR: INR: 2.4

## 2014-12-05 ENCOUNTER — Other Ambulatory Visit: Payer: Self-pay

## 2014-12-05 MED ORDER — HYDROCHLOROTHIAZIDE 25 MG PO TABS: 25.0000 mg | ORAL_TABLET | Freq: Every morning | ORAL | Status: DC

## 2014-12-16 ENCOUNTER — Ambulatory Visit (INDEPENDENT_AMBULATORY_CARE_PROVIDER_SITE_OTHER): Payer: Medicare Other | Admitting: Pharmacist Clinician (PhC)/ Clinical Pharmacy Specialist

## 2014-12-16 DIAGNOSIS — Z5181 Encounter for therapeutic drug level monitoring: Secondary | ICD-10-CM

## 2014-12-16 DIAGNOSIS — Z7901 Long term (current) use of anticoagulants: Secondary | ICD-10-CM

## 2014-12-16 DIAGNOSIS — I4891 Unspecified atrial fibrillation: Secondary | ICD-10-CM

## 2014-12-16 LAB — POCT INR: INR: 3.1

## 2014-12-18 LAB — LIPID PANEL
CHOLESTEROL: 166 mg/dL (ref 0–200)
HDL: 65 mg/dL (ref 35–70)
LDL CALC: 86 mg/dL
TRIGLYCERIDES: 75 mg/dL (ref 40–160)

## 2014-12-18 LAB — HEPATIC FUNCTION PANEL
ALT: 16 U/L (ref 10–40)
AST: 22 U/L (ref 14–40)

## 2014-12-18 LAB — TSH: TSH: 5.89 u[IU]/mL (ref <=5.90)

## 2014-12-18 LAB — CBC AND DIFFERENTIAL: Hemoglobin: 14.7 g/dL (ref 13.5–17.5)

## 2014-12-18 LAB — BASIC METABOLIC PANEL
Creatinine: 1 mg/dL (ref <=1.3)
Glucose: 92 mg/dL

## 2014-12-25 ENCOUNTER — Encounter: Payer: Self-pay | Admitting: Family Medicine

## 2014-12-25 ENCOUNTER — Ambulatory Visit (INDEPENDENT_AMBULATORY_CARE_PROVIDER_SITE_OTHER): Payer: Medicare Other | Admitting: Family Medicine

## 2014-12-25 VITALS — BP 110/72 | HR 84 | Temp 97.9°F | Wt 209.0 lb

## 2014-12-25 DIAGNOSIS — R946 Abnormal results of thyroid function studies: Secondary | ICD-10-CM | POA: Diagnosis not present

## 2014-12-25 DIAGNOSIS — Z23 Encounter for immunization: Secondary | ICD-10-CM | POA: Diagnosis not present

## 2014-12-25 DIAGNOSIS — R7989 Other specified abnormal findings of blood chemistry: Secondary | ICD-10-CM

## 2014-12-25 NOTE — Patient Instructions (Signed)
Recheck TSH in about 6 months at a lab visit.  Take care.  Glad to see you.

## 2014-12-26 ENCOUNTER — Encounter: Payer: Self-pay | Admitting: Family Medicine

## 2014-12-26 DIAGNOSIS — R7989 Other specified abnormal findings of blood chemistry: Secondary | ICD-10-CM | POA: Insufficient documentation

## 2014-12-26 NOTE — Progress Notes (Signed)
Routine vaccinations d/w pt.  Updated today.   Seen at [facility].  Minimal elevation in TSH noted. Asking for input.  No h/o thyroid disease, no mass, anterior neck pain, dysphagia.  No overt sx of hypo/hyperthyroidism.   Meds, vitals, and allergies reviewed.   ROS: See HPI.  Otherwise, noncontributory.  nad ncat Neck supple, no LA, no TMG, no masses noted.

## 2014-12-26 NOTE — Assessment & Plan Note (Signed)
D/w pt.  Borderline hypothyroidism.  Wouldn't have to have replacement yet.  Dx/condition d/w pt.   Can recheck in about 6 months, sooner if sx develop.  He agrees.

## 2014-12-31 ENCOUNTER — Encounter: Payer: Self-pay | Admitting: Family Medicine

## 2015-01-13 ENCOUNTER — Ambulatory Visit (INDEPENDENT_AMBULATORY_CARE_PROVIDER_SITE_OTHER): Payer: Medicare Other

## 2015-01-13 DIAGNOSIS — Z5181 Encounter for therapeutic drug level monitoring: Secondary | ICD-10-CM | POA: Diagnosis not present

## 2015-01-13 DIAGNOSIS — Z7901 Long term (current) use of anticoagulants: Secondary | ICD-10-CM | POA: Diagnosis not present

## 2015-01-13 DIAGNOSIS — I4891 Unspecified atrial fibrillation: Secondary | ICD-10-CM

## 2015-01-13 LAB — POCT INR: INR: 2.8

## 2015-02-05 ENCOUNTER — Other Ambulatory Visit: Payer: Self-pay | Admitting: Dermatology

## 2015-02-05 DIAGNOSIS — C44319 Basal cell carcinoma of skin of other parts of face: Secondary | ICD-10-CM | POA: Diagnosis not present

## 2015-02-05 DIAGNOSIS — L570 Actinic keratosis: Secondary | ICD-10-CM | POA: Diagnosis not present

## 2015-02-10 ENCOUNTER — Ambulatory Visit (INDEPENDENT_AMBULATORY_CARE_PROVIDER_SITE_OTHER): Payer: Medicare Other | Admitting: Pharmacist

## 2015-02-10 DIAGNOSIS — Z7901 Long term (current) use of anticoagulants: Secondary | ICD-10-CM | POA: Diagnosis not present

## 2015-02-10 DIAGNOSIS — I4891 Unspecified atrial fibrillation: Secondary | ICD-10-CM | POA: Diagnosis not present

## 2015-02-10 DIAGNOSIS — Z5181 Encounter for therapeutic drug level monitoring: Secondary | ICD-10-CM | POA: Diagnosis not present

## 2015-02-10 LAB — POCT INR: INR: 2.4

## 2015-03-06 ENCOUNTER — Ambulatory Visit: Payer: Medicare Other | Admitting: Family Medicine

## 2015-03-17 DIAGNOSIS — R143 Flatulence: Secondary | ICD-10-CM | POA: Diagnosis not present

## 2015-03-17 DIAGNOSIS — R197 Diarrhea, unspecified: Secondary | ICD-10-CM | POA: Diagnosis not present

## 2015-03-17 DIAGNOSIS — K625 Hemorrhage of anus and rectum: Secondary | ICD-10-CM | POA: Diagnosis not present

## 2015-03-26 ENCOUNTER — Ambulatory Visit (INDEPENDENT_AMBULATORY_CARE_PROVIDER_SITE_OTHER): Payer: Medicare Other | Admitting: *Deleted

## 2015-03-26 DIAGNOSIS — I4891 Unspecified atrial fibrillation: Secondary | ICD-10-CM

## 2015-03-26 DIAGNOSIS — Z7901 Long term (current) use of anticoagulants: Secondary | ICD-10-CM | POA: Diagnosis not present

## 2015-03-26 DIAGNOSIS — Z5181 Encounter for therapeutic drug level monitoring: Secondary | ICD-10-CM | POA: Diagnosis not present

## 2015-03-26 LAB — POCT INR: INR: 1.5

## 2015-04-08 ENCOUNTER — Ambulatory Visit (INDEPENDENT_AMBULATORY_CARE_PROVIDER_SITE_OTHER): Payer: Medicare Other | Admitting: *Deleted

## 2015-04-08 DIAGNOSIS — I4891 Unspecified atrial fibrillation: Secondary | ICD-10-CM | POA: Diagnosis not present

## 2015-04-08 DIAGNOSIS — Z7901 Long term (current) use of anticoagulants: Secondary | ICD-10-CM | POA: Diagnosis not present

## 2015-04-08 DIAGNOSIS — Z5181 Encounter for therapeutic drug level monitoring: Secondary | ICD-10-CM

## 2015-04-08 LAB — POCT INR: INR: 2

## 2015-04-23 ENCOUNTER — Encounter: Payer: Self-pay | Admitting: *Deleted

## 2015-04-30 ENCOUNTER — Other Ambulatory Visit: Payer: Self-pay | Admitting: Internal Medicine

## 2015-04-30 ENCOUNTER — Ambulatory Visit (INDEPENDENT_AMBULATORY_CARE_PROVIDER_SITE_OTHER): Payer: Medicare Other | Admitting: Family Medicine

## 2015-04-30 ENCOUNTER — Encounter: Payer: Self-pay | Admitting: Family Medicine

## 2015-04-30 VITALS — BP 122/76 | HR 61 | Temp 97.7°F | Wt 203.5 lb

## 2015-04-30 DIAGNOSIS — M545 Low back pain: Secondary | ICD-10-CM | POA: Diagnosis not present

## 2015-04-30 MED ORDER — CYCLOBENZAPRINE HCL 10 MG PO TABS: 5.0000 mg | ORAL_TABLET | Freq: Every evening | ORAL | Status: DC | PRN

## 2015-04-30 NOTE — Progress Notes (Signed)
Pre visit review using our clinic review tool, if applicable. No additional management support is needed unless otherwise documented below in the visit note.  3 weeks of sx.  Started after lifting a heavy box.  L lowe back pain, with a palpable sore area there.  Can occ radiate across the lower back to the R side, occ can radiate to the L groin or have some numbness in the L thigh.  No R groin pain.  No FCNAVD.  No B/B sx.  No weakness.  No trauma.   Meds, vitals, and allergies reviewed.   ROS: See HPI.  Otherwise, noncontributory.  nad ncat IRR ctab abd soft, not ttp Back w/o midline pain but muscle spasm palpated in the L lower back w/o rash or bruising.  L SLR leg neg.  S/S wnl BLE but some pain with back flex and ext.  No CVA pain.

## 2015-04-30 NOTE — Patient Instructions (Signed)
Keep using a heating pad, use the flexeril if needed at night (sedcation caution), and try the back exercises.   Likely a muscle spasm, with likely nerve root irritation at/near either L1 or L2.  Take care.  Glad to see you.  Update me as needed.

## 2015-05-01 DIAGNOSIS — M545 Low back pain, unspecified: Secondary | ICD-10-CM | POA: Insufficient documentation

## 2015-05-01 NOTE — Assessment & Plan Note (Signed)
Likely a muscle spasm, with likely nerve root irritation at/near either L1 or L2.  Keep using a heating pad, use the flexeril if needed at night (sedcation caution), and try the back exercises-handout given to patient and discussed.  F/u prn.  No need for imaging now.  He agrees.

## 2015-05-05 ENCOUNTER — Ambulatory Visit (INDEPENDENT_AMBULATORY_CARE_PROVIDER_SITE_OTHER): Payer: Medicare Other | Admitting: *Deleted

## 2015-05-05 DIAGNOSIS — Z5181 Encounter for therapeutic drug level monitoring: Secondary | ICD-10-CM | POA: Diagnosis not present

## 2015-05-05 DIAGNOSIS — Z7901 Long term (current) use of anticoagulants: Secondary | ICD-10-CM

## 2015-05-05 DIAGNOSIS — I4891 Unspecified atrial fibrillation: Secondary | ICD-10-CM

## 2015-05-05 LAB — POCT INR: INR: 2.3

## 2015-05-06 ENCOUNTER — Ambulatory Visit (INDEPENDENT_AMBULATORY_CARE_PROVIDER_SITE_OTHER): Payer: Medicare Other | Admitting: Primary Care

## 2015-05-06 ENCOUNTER — Ambulatory Visit (INDEPENDENT_AMBULATORY_CARE_PROVIDER_SITE_OTHER)
Admission: RE | Admit: 2015-05-06 | Discharge: 2015-05-06 | Disposition: A | Discharge status: Home / Self Care | Payer: Medicare Other | Source: Ambulatory Visit | Attending: Primary Care | Admitting: Primary Care

## 2015-05-06 ENCOUNTER — Encounter: Payer: Self-pay | Admitting: Primary Care

## 2015-05-06 ENCOUNTER — Ambulatory Visit: Payer: Medicare Other | Admitting: Family Medicine

## 2015-05-06 ENCOUNTER — Encounter: Payer: Self-pay | Admitting: Family Medicine

## 2015-05-06 ENCOUNTER — Telehealth: Payer: Self-pay | Admitting: Family Medicine

## 2015-05-06 VITALS — BP 128/78 | HR 94 | Temp 98.1°F | Wt 199.0 lb

## 2015-05-06 DIAGNOSIS — M5442 Lumbago with sciatica, left side: Secondary | ICD-10-CM

## 2015-05-06 DIAGNOSIS — M47816 Spondylosis without myelopathy or radiculopathy, lumbar region: Secondary | ICD-10-CM | POA: Diagnosis not present

## 2015-05-06 MED ORDER — PREDNISONE 10 MG PO TABS: ORAL_TABLET | ORAL | Status: DC

## 2015-05-06 MED ORDER — TRAMADOL HCL 50 MG PO TABS: 50.0000 mg | ORAL_TABLET | Freq: Three times a day (TID) | ORAL | Status: DC | PRN

## 2015-05-06 NOTE — Progress Notes (Signed)
Pre visit review using our clinic review tool, if applicable. No additional management support is needed unless otherwise documented below in the visit note. 

## 2015-05-06 NOTE — Patient Instructions (Signed)
Start Prednisone tablets for inflammation/irritation to your lower back. Take 3 tablets for 2 days, 2 tablets for 2 days, 1 tablet for 2 days.  You may take Tramadol for severe pain. Take 1 tablet by mouth every 8 hours as needed.  Complete xray(s) prior to leaving today. I will notify you of your results once received.  Apply heat and avoid heavy lifting.   Call if no improvement in 1 week.   It was a pleasure meeting you!

## 2015-05-06 NOTE — Telephone Encounter (Signed)
Spoke to patient and was advised that he thinks that he has a kidney stone and the pain level is about 8 with pain is in his lower back. Patient stated that he has an appointment today at 3:00 with his urologist Dr. Junious Silk.  Patient stated that he does not feel at this point it is bad enough to go to the ER. Advised patient that his urologist is the best one to take care of a kidney stone. Patient stated to go ahead and cancel his appointment at 6:00 with Dr. Damita Dunnings since he is already seeing his urologist today.

## 2015-05-06 NOTE — Telephone Encounter (Signed)
Please call pt.  See mychart message.   Please ask about pain level, location, any weakness, etc.   If emergent sx, then to ER.  O/w see what he is doing for pain.  Thanks.

## 2015-05-06 NOTE — Progress Notes (Signed)
Subjective:    Patient ID: [name] [name], male    DOB: 1935-01-03, 81 y.o.   MRN: [id number]  HPI  Mr. Vandekamp is a 81 year old male who presents today with a chief complaint of low back pain. He as evaluated on 05/01/15 with a chief complaint of lower back pain. His pain intially began 1 week before christmas after lifting a heavy box of dishes from the floor. He was determined to likely have a muscle spasm with nerve root irritation around L1/L2. He was advised with supportive measures such as heating pad, flexeril PRN, and back exercises.   He then called in earlier this afternoon with increased pain to lower left side of back which felt like a kidney stone. He made an appointment to see the Urologist today at 3pm but was unable to make due to transportation. He spoke with the triage RN at [facility] who doesn't believe his symptoms are related to a renal stone.   His pain is currently located to the lower left side of his back with radiation down through the left thigh. His pain is worse with standing erectly, turing in the bed. He feels as though there's a small ball back into his muscle. He went outside yesterday afternoon and shoveled some snow for about 30-45 minutes. His pain was worse today.  He filled the flexeril this morning and has take 2 full tablets today without any noticable improvement. The pain is continuously bothersome and describes it as a bad cramp to his back, with sharp pain down to his left thigh.  Denies hematuria, changes in urine stream, frequency, fevers, numbness/tingling. No improvement with flexeril.   Review of Systems  Constitutional: Negative for fever and chills.  Respiratory: Negative for shortness of breath.   Cardiovascular: Negative for chest pain.  Genitourinary: Negative for dysuria, frequency, hematuria and difficulty urinating.  Musculoskeletal: Positive for back pain.  Neurological: Negative for numbness.       Past Medical  History  Diagnosis Date  . HTN (hypertension)   . Paroxysmal atrial fibrillation (HCC)   . Mild aortic stenosis   . Coronary artery disease CARDIOLOGIST-  DR KLEIN/ ALLRED    Prior CABG 2004, patent grafts 2007, ejection fraction 45%  . S/P CABG x 5   . History of ischemic heart disease   . 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 Dyslipidemia   . Anticoagulated on Coumadin   . GERD (gastroesophageal reflux disease)   . PONV (postoperative nausea and vomiting)   . Chronic cough     NO CARDIAC OR PULMONARY RELATED  . Bladder neoplasm   . Arthritis   . Dry eyes   . Dysrhythmia     a fib episodic    Social History   Social History  . Marital Status: Married    Spouse Name: N/A  . Number of Children: 3  . Years of Education: N/A   Occupational History  . Retired    Social History Main Topics  . Smoking status: Former Smoker -- 1.00 packs/day for 25 years    Types: Cigarettes    Quit date: 04/25/1980  . Smokeless tobacco: Never Used  . Alcohol Use: No  . Drug Use: No  . Sexual Activity: Not on file   Other Topics Concern  . Not on file   Social History Narrative   [employer] '56-'58, overseas to [place]   Some care through [place]   No service disability  Past Surgical History  Procedure Laterality Date  . Electrophysiology study  07-27-2005  DR Cristopher Peru    MAPPING --   RESULT NONINDUCIBLE VT OR SVT/  DX RIGHT VENTRICULAR OUTFLOW TRACT VT AND RULES OUT MORE MALIGNANT CAUSES OF VT  . Lumbar disc surgery  1999    L4 -- L5  . Inguinal hernia repair Bilateral 1954  &  1990  . Knee arthroscopy Right 1994  . Coronary artery bypass graft  03-08-2003  DR [id number]    LIMA TO LAD/ SVG TO OM2/  SVG TO DIAGONAL / SVG TO PDA & PLA  . Removal vocal cord polyps  1978  . Transthoracic echocardiogram  08-08-2011    MILD LVH/  EF [id number]  GRADE I DIASTOLIC DYSFUNCTION/ MILD AV STENOSIS  . Cardiac catheterization  07-26-2005  DR GAMBLE     PRESERVED LVF/  EF 50%/ PATENT GRAFTS  . Cardiac catheterization  03-04-2003  DR GAMBLE    SEVERE 3 VESSEL DISEASE  . Cardiac catheterization  [place]    PAF/ FALSE POSITIVE STRESS TEST  . Cataract extraction w/ intraocular lens implant Right   . Tonsillectomy  AS CHILD  . Cystoscopy with biopsy N/A 12/25/2012    Procedure: CYSTOSCOPY WITH BLADDER BIOPSY;  Surgeon: Fredricka Bonine, MD;  Location: [facility];  Service: Urology;  Laterality: N/A;    Family History  Problem Relation Age of Onset  . Cancer Mother     Allergies  Allergen Reactions  . Ace Inhibitors Other (See Comments)    COUGH  . Hycodan [Hydrocodone-Homatropine] Nausea And Vomiting  . Zocor [Simvastatin] Other (See Comments)    MYALGIA  . Cephalexin Rash    Current Outpatient Prescriptions on File Prior to Visit  Medication Sig Dispense Refill  . acetaminophen (TYLENOL) 325 MG tablet Take 650 mg by mouth once as needed for moderate pain or headache.    Marland Kitchen amiodarone (PACERONE) 200 MG tablet Take 100 mg by mouth every morning.     . butalbital-acetaminophen-caffeine (FIORICET, ESGIC) 50-325-40 MG per tablet Take 1 tablet by mouth once as needed for headache.     . cetirizine (ZYRTEC) 10 MG tablet Take 10 mg by mouth daily.    . cholecalciferol (VITAMIN D) 1000 UNITS tablet Take 1,000 Units by mouth daily.    . cyclobenzaprine (FLEXERIL) 10 MG tablet Take 0.5 tablets (5 mg total) by mouth at bedtime as needed for muscle spasms. 15 tablet 0  . hydrochlorothiazide (HYDRODIURIL) 25 MG tablet Take 1 tablet (25 mg total) by mouth every morning. 90 tablet 2  . Polyethyl Glycol-Propyl Glycol (SYSTANE ULTRA OP) Apply 1-2 drops to eye once as needed (dry eyes.).     Marland Kitchen potassium chloride (K-DUR) 10 MEQ tablet Take 10 mEq by mouth every morning. Take as directed    . pravastatin (PRAVACHOL) 80 MG tablet Take 80 mg by mouth every evening.     . warfarin (COUMADIN) 5 MG tablet TAKE AS DIRECTED BY  COUMADIN CLINIC 120 tablet 1   No current facility-administered medications on file prior to visit.    BP 128/78 mmHg  Pulse 94  Temp(Src) 98.1 F (36.7 C) (Oral)  Wt 199 lb (90.266 kg)    Objective:   Physical Exam  Constitutional: He appears well-nourished.  Cardiovascular: Normal rate and regular rhythm.   Pulmonary/Chest: Effort normal and breath sounds normal.  Abdominal: There is no CVA tenderness.  Musculoskeletal:  Tenderness to left lower back with deep palpation.  Negative straight leg raise bilaterally. Gait with hunch forward.          Assessment & Plan:  Muscle strain/spasm:  Suspect muscle strain/spasm, especially since pain became worse after shoveling snow yesterday. No improvement with flexeril.  Exam with tenderness to left lower back, suspicious for muscle spasm/strain. Negative straight leg raise.   Patient also worried of lumbar spine involvement given history in 1999. Will obtain DG of lumbar spine, however suspicion is low.  Will provide short taper of prednisone to help with nerve root irritation suspected to left lower extremity.  RX for tramadol provided to take PRN for severe pain. Drowsiness precautions provided. Heat, rest. He is to call us if no improvement in 1 week.

## 2015-05-06 NOTE — Telephone Encounter (Signed)
Noted. Thanks.

## 2015-05-07 ENCOUNTER — Telehealth: Payer: Self-pay | Admitting: Family Medicine

## 2015-05-07 DIAGNOSIS — M5442 Lumbago with sciatica, left side: Secondary | ICD-10-CM

## 2015-05-07 MED ORDER — PREDNISONE 10 MG PO TABS: ORAL_TABLET | ORAL | Status: DC

## 2015-05-07 NOTE — Telephone Encounter (Signed)
Pt called and wanted his rx for predizone sent to [facility] instead Public house manager

## 2015-05-07 NOTE — Telephone Encounter (Signed)
Called and notified patient of Kate's comments. Also sent prednisone to [facility]. Patient verbalized understanding.

## 2015-05-08 ENCOUNTER — Telehealth: Payer: Self-pay | Admitting: Family Medicine

## 2015-05-08 DIAGNOSIS — M5442 Lumbago with sciatica, left side: Secondary | ICD-10-CM

## 2015-05-08 NOTE — Telephone Encounter (Signed)
PLEASE NOTE: All timestamps contained within this report are represented as [place] Standard Time. CONFIDENTIALTY NOTICE: This fax transmission is intended only for the addressee. It contains information that is legally privileged, confidential or otherwise protected from use or disclosure. If you are not the intended recipient, you are strictly prohibited from reviewing, disclosing, copying using or disseminating any of this information or taking any action in reliance on or regarding this information. If you have received this fax in error, please notify us immediately by telephone so that we can arrange for its return to [place]. Phone: [phone], Toll-Free: [phone], Fax: [phone] Page: 1 of 1 Call Id: [id number] [place] Patient Name: [name] [name] Gender: Male DOB: 16-Jun-1934 Age: 81 Y 74 M 24 D Return Phone Number: [phone] (Primary) Address: City/State/Zip: [place] Client [place] - Client Client Site [place] Physician Renford Dills Contact Type Call Caller Name [place] Phone Number [phone] Relationship To Patient Spouse Is this call to report lab results? No Call Type General Information Initial Comment Caller states husband has been in 2x recently seems to have more than strained muscle; feels similar to when had pinched nerve; Dr Kristeen Miss wants info faxed to him re xrays; will not release until sees xrays but cannot treat as new pt; Fax number for Dr Ellene Route [phone] to send records; adv to call office after 1pm; General Information Type Message Only Nurse Assessment Guidelines Guideline Title Affirmed Question Affirmed Notes Nurse Date/Time ([place] Time) Disp. Time Eilene Ghazi Time) Disposition Final User 05/08/2015 11:45:18 AM General Information Provided Yes Jerrye Beavers After  Care Instructions Given Call Event Type User Date / Time Description

## 2015-05-08 NOTE — Telephone Encounter (Signed)
Pt called stating his back pain is getting worse He has a call into dr elsner @ [facility] neuro He stated he called them a couple hours ago and they have not called him back He wanted to know if you could do a referral to get him in soon

## 2015-05-08 NOTE — Telephone Encounter (Signed)
-----   Message from Deboraha Sprang, MD sent at 05/07/2015  9:36 PM EST ----- prob reasonable  ----- Message -----    From: Margretta Sidle, RN    Sent: 05/05/2015   1:19 PM      To: Deboraha Sprang, MD  Dr Caryl Comes Mr Muraski was seen in coumadin clinic today and he asked if his INR goal range could be changed back to 2.0 to 3.0 . His INR range has been 2.0 to 2.5 for several years possibly 7 to 8 years Pt states goal was decreased when had hemorrhage in eyes was seeing he thinks Dr Gwenlyn Found at [place] at the time of this occurrence  Thanks  Please advise  Elbert Ewings RN

## 2015-05-08 NOTE — Telephone Encounter (Signed)
Discussed with Ebony Hail who will try to call and schedule. This may or may not help get an appointment sooner. I would encourage the patient to try calling again.

## 2015-05-08 NOTE — Telephone Encounter (Signed)
Spoke with pt and informed that had received message back from Dr Caryl Comes  on his next visit will change INR goal to 2.0 to 3.0 from 2.0 to 2.5 and pt states understanding

## 2015-05-08 NOTE — Telephone Encounter (Signed)
Ebony Hail pt care coordinator will fax info requested and Ms Dorgan notified and voiced understanding. FYI to [name] and Allie Bossier NP.

## 2015-05-09 ENCOUNTER — Ambulatory Visit: Payer: Medicare Other | Admitting: Family Medicine

## 2015-05-10 NOTE — Telephone Encounter (Signed)
Noted. Thanks.

## 2015-05-11 ENCOUNTER — Telehealth: Payer: Self-pay | Admitting: Family Medicine

## 2015-05-11 ENCOUNTER — Ambulatory Visit (INDEPENDENT_AMBULATORY_CARE_PROVIDER_SITE_OTHER): Payer: Medicare Other | Admitting: Family Medicine

## 2015-05-11 VITALS — BP 124/74 | HR 102 | Temp 98.4°F | Ht 72.0 in | Wt 199.1 lb

## 2015-05-11 DIAGNOSIS — S76012A Strain of muscle, fascia and tendon of left hip, initial encounter: Secondary | ICD-10-CM | POA: Diagnosis not present

## 2015-05-11 DIAGNOSIS — M5442 Lumbago with sciatica, left side: Secondary | ICD-10-CM | POA: Diagnosis not present

## 2015-05-11 DIAGNOSIS — M5440 Lumbago with sciatica, unspecified side: Secondary | ICD-10-CM

## 2015-05-11 MED ORDER — TRAMADOL HCL 50 MG PO TABS: 50.0000 mg | ORAL_TABLET | Freq: Four times a day (QID) | ORAL | Status: DC | PRN

## 2015-05-11 MED ORDER — PREDNISONE 20 MG PO TABS: ORAL_TABLET | ORAL | Status: DC

## 2015-05-11 NOTE — Progress Notes (Signed)
Pre visit review using our clinic review tool, if applicable. No additional management support is needed unless otherwise documented below in the visit note. 

## 2015-05-11 NOTE — Telephone Encounter (Signed)
Patient advised and will keep the appt with Dr. Lorelei Pont.

## 2015-05-11 NOTE — Telephone Encounter (Signed)
Please call her back.  I see a few options: 1. Keep the appointment with Dr. Lorelei Pont.  2. Change his pain med (if that is done someone would have to pick up the hard copy at the clinic as we can't fax/call it in). 3. Ordered the MRI.    I'm okay ordering the MRI- I put in the order.  It may still be reasonable to keep the appointment with [place].   Thanks.

## 2015-05-11 NOTE — Progress Notes (Signed)
Dr. Frederico Hamman T. Lucette Kratz, MD, [facility] [place], [address] Phone: [phone] Fax: [phone]  05/11/2015  Patient: [name] [name], MRN: [id number], DOB: 1935-01-24, 81 y.o.  Primary Physician:  Elsie Stain, MD   Chief Complaint  Patient presents with  . Back Pain    not better--lower back to the legs--if given Rx, patient request printed Rx   Subjective:   [name] [name] is a 81 y.o. very pleasant male patient who presents with the following: Back Pain  ongoing for approximately: 3-4 weeks The patient has had back pain before. Prior L4-5 discectomy in 1999 by Dr. Ellene Route The back pain is localized into the lumbar spine area. They also describe radiculopathy bilaterally that is worsened in the last 2 weeks after the patient was shoveling snow.  He now has radiculopathy down both legs, worse in the left, but they're going down past the knee and into the lower extremities.  Started on the sat before christmas. Picked up a box of dishes and started to get worse and worse - last Tuesday and shoveled some snow. Since then, not comfortable at night. All in the LB and goes down the left and into the legs.   On Coumadin and Amiodarone, did short, low-dose prednisone for 6 days, 30-20-10, and flexeril that did not help. Tramadol has helped some with pain.  Has had 2 prior hernia operations.  Discectomy back in 1999, L4-5.  Dr. Ellene Route did his last back surgery.  Hip flexion on l is weak and ttp along muscle  No numbness or tingling. No bowel or bladder incontinence. No focal weakness except at L hip. Prior interventions: L4-5 discectomy, 1999 Physical therapy: No Chiropractic manipulations: No Acupuncture: No Osteopathic manipulation: No Heat or cold: Minimal effect  The radiological images were independently reviewed by myself in the office and results were reviewed with the patient. My independent  interpretation of images:  5 use spine series is reviewed with the patient face-to-face.  Most significant for degenerative disc disease relatively diffusely throughout the entirety of the lumbar spine and the lower thoracic spine.  Aortic calcifications are prominent.  Facet arthropathy is noted diffusely as well.  I attempted to answer all questions. Electronically Signed  By: Owens Loffler, MD On: 05/12/2015 9:52 AM   Past Medical History, Surgical History, Family History, Medications, Allergies have been reviewed and updated if relevant.  Patient Active Problem List   Diagnosis Date Noted  . Lower back pain 05/01/2015  . Elevated TSH 12/26/2014  . Back pain 01/22/2014  . Rash and nonspecific skin eruption 12/16/2013  . Encounter for therapeutic drug monitoring 07/31/2013  . Dyspnea on exertion 05/16/2013  . Aortic stenosis 05/16/2013  . Bladder neoplasm 11/23/2012  . BCC (basal cell carcinoma of skin) 08/29/2012  . Vertigo 08/24/2012  . Abdominal pain, other specified site 08/01/2012  . Chest wall pain 07/09/2012  . GERD (gastroesophageal reflux disease) 05/01/2012  . Hyperlipidemia 03/14/2012  . TMJ pain dysfunction syndrome 01/20/2012  . Toe laceration 12/06/2011  . CAD (coronary artery disease) of artery bypass graft 11/30/2011  . Long term (current) use of anticoagulants 08/31/2011  . Ventricular tachycardia, nonsustained   . OTHER ACUTE SINUSITIS 04/29/2009  . Cough 04/29/2009  . HEARING LOSS, SUDDEN 12/03/2007  . RHINITIS 12/03/2007  . HYPERTENSION, ESSENTIAL NOS 02/22/2007  . Atrial fibrillation ([place]) 02/22/2007  . SYMPTOM, HEADACHE 02/22/2007    Past Medical History  Diagnosis Date  .  HTN (hypertension)   . Paroxysmal atrial fibrillation (HCC)   . Mild aortic stenosis   . Coronary artery disease CARDIOLOGIST-  DR KLEIN/ ALLRED    Prior CABG 2004, patent grafts 2007, ejection fraction 45%  . S/P CABG x 5   . History of ischemic heart disease   . RVOT-VT (right  ventricular outflow tract ventricular tachycardia) ([place])   . Dyslipidemia   . Anticoagulated on Coumadin   . GERD (gastroesophageal reflux disease)   . PONV (postoperative nausea and vomiting)   . Chronic cough     NO CARDIAC OR PULMONARY RELATED  . Bladder neoplasm   . Arthritis   . Dry eyes   . Dysrhythmia     a fib episodic    Past Surgical History  Procedure Laterality Date  . Electrophysiology study  07-27-2005  DR Cristopher Peru    MAPPING --   RESULT NONINDUCIBLE VT OR SVT/  DX RIGHT VENTRICULAR OUTFLOW TRACT VT AND RULES OUT MORE MALIGNANT CAUSES OF VT  . Lumbar disc surgery  1999    L4 -- L5  . Inguinal hernia repair Bilateral 1954  &  1990  . Knee arthroscopy Right 1994  . Coronary artery bypass graft  03-08-2003  DR [id number]    LIMA TO LAD/ SVG TO OM2/  SVG TO DIAGONAL / SVG TO PDA & PLA  . Removal vocal cord polyps  1978  . Transthoracic echocardiogram  08-08-2011    MILD LVH/  EF A999333  GRADE I DIASTOLIC DYSFUNCTION/ MILD AV STENOSIS  . Cardiac catheterization  07-26-2005  DR GAMBLE    PRESERVED LVF/  EF 50%/ PATENT GRAFTS  . Cardiac catheterization  03-04-2003  DR GAMBLE    SEVERE 3 VESSEL DISEASE  . Cardiac catheterization  [place]    PAF/ FALSE POSITIVE STRESS TEST  . Cataract extraction w/ intraocular lens implant Right   . Tonsillectomy  AS CHILD  . Cystoscopy with biopsy N/A 12/25/2012    Procedure: CYSTOSCOPY WITH BLADDER BIOPSY;  Surgeon: Fredricka Bonine, MD;  Location: [facility];  Service: Urology;  Laterality: N/A;    Social History   Social History  . Marital Status: Married    Spouse Name: N/A  . Number of Children: 3  . Years of Education: N/A   Occupational History  . Retired    Social History Main Topics  . Smoking status: Former Smoker -- 1.00 packs/day for 25 years    Types: Cigarettes    Quit date: 04/25/1980  . Smokeless tobacco: Never Used  . Alcohol Use: No  . Drug Use: No  . Sexual Activity:  Not on file   Other Topics Concern  . 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 Cancer Mother     Allergies  Allergen Reactions  . Ace Inhibitors Other (See Comments)    COUGH  . Hycodan [Hydrocodone-Homatropine] Nausea And Vomiting  . Zocor [Simvastatin] Other (See Comments)    MYALGIA  . Cephalexin Rash    Medication list reviewed and updated in full in [place].  GEN: No fevers, chills. Nontoxic. Primarily MSK c/o today. MSK: Detailed in the HPI GI: tolerating PO intake without difficulty Neuro: As above  Otherwise the pertinent positives of the ROS are noted above.    Objective:  Blood pressure 124/74, pulse 102, temperature 98.4 F (36.9 C), temperature source Oral, height 6' (1.829 m), weight 199 lb 1.9 oz (90.32 kg), SpO2 93 %.  Gen: Well-developed,well-nourished,in no acute distress; alert,appropriate and cooperative throughout examination HEENT: Normocephalic and atraumatic without obvious abnormalities.  Ears, externally no deformities Pulm: Breathing comfortably in no respiratory distress Range of motion at  the waist: Flexion, rotation and lateral bending: rrelatively preserved with full flexion.  Extension causes pain.  Lateral bending and rotation are fully preserved.  No echymosis or edema Rises to examination table with no difficulty Gait: minimally antalgic  Inspection/Deformity: No abnormality Paraspinus T:  Minimally tender to palpation, some from L4-S1.  B Ankle Dorsiflexion (L5,4): 5/5 B Great Toe Dorsiflexion (L5,4): 5/5 Heel Walk (L5): WNL Toe Walk (S1): WNL Rise/Squat (L4): WNL, mild pain  SENSORY B Medial Foot (L4): WNL B Dorsum (L5): WNL B Lateral (S1): WNL Light Touch: WNL Pinprick: WNL  REFLEXES Knee (L4): 2+ Ankle (S1): 2+  B SLR, seated: neg B SLR, supine: neg B FABER: some  posterior pain B Reverse FABER: neg B Greater Troch: NT B Log Roll: neg B Stork: NT B Sciatic Notch: NT  Hip flexion on the left is 4 out of 5 compared to 5 out of 5 on the right and there is tenderness to deep palpation on the hip abductors and hip flexors on the left.  Radiology: Dg Lumbar Spine Complete  05/07/2015  CLINICAL DATA:  Back pain. EXAM: LUMBAR SPINE - COMPLETE 4+ VIEW COMPARISON:  02/04/2014. FINDINGS: Diffuse degenerative change lumbar spine with multilevel disc degeneration endplate osteophyte formation. Degenerative changes both SI joints and hips. Aortoiliac atherosclerotic vascular calcifications present. IMPRESSION: 1. Diffuse multilevel degenerative change. Degenerative changes both SI joints and hips. No acute abnormality. 2.  Aortic atherosclerotic vascular disease. Electronically Signed   By: Marcello Moores  Register   On: 05/07/2015 08:42    Assessment and Plan:   Left-sided low back pain with left-sided sciatica - Plan: traMADol (ULTRAM) 50 MG tablet  Strain of hip flexor, left, initial encounter  Most suspicious for disc herniation vs potentially bony osteophyte encroachment upon nerve in an elderly patient with diffuse degenerative disc disease and spondyloarthropathy.  He rates his pain at 8 out of 10.  Range of motion is preserved with the exception of extension.  Neurovascularly intact.  I would use a higher dose, longer taper with some oral steroids going for a full 10 days of 40/20. Continue tramadol and tylenol for pain.  He would most likely have a significantly greater than 50% chance of managing this conservatively, which I reviewed with the patient and his wife.  If spinal cord mass effect or other acute emergent findings are present on MRI, then urgent consultation would be appropriate.  Follow-up: dependent on MRI findings  New Prescriptions   PREDNISONE (DELTASONE) 20 MG TABLET    2 tabs po for 5 days, then 1 tab po for 5 days   Modified Medications    Modified Medication Previous Medication   TRAMADOL (ULTRAM) 50 MG TABLET traMADol (ULTRAM) 50 MG tablet      Take 1 tablet (50 mg total) by mouth every 6 (six) hours as needed for severe pain.    Take 1 tablet (50 mg total) by mouth every 8 (eight) hours as needed for severe pain.   No orders of the defined types were placed in this encounter.    Signed,  Maud Deed. Andreah Goheen, MD   Patient's Medications  New Prescriptions  PREDNISONE (DELTASONE) 20 MG TABLET    2 tabs po for 5 days, then 1 tab po for 5 days  Previous Medications   ACETAMINOPHEN (TYLENOL) 325 MG TABLET    Take 650 mg by mouth once as needed for moderate pain or headache.   AMIODARONE (PACERONE) 200 MG TABLET    Take 100 mg by mouth every morning.    BUTALBITAL-ACETAMINOPHEN-CAFFEINE (FIORICET, ESGIC) 50-325-40 MG PER TABLET    Take 1 tablet by mouth once as needed for headache.    CETIRIZINE (ZYRTEC) 10 MG TABLET    Take 10 mg by mouth daily.   CHOLECALCIFEROL (VITAMIN D) 1000 UNITS TABLET    Take 1,000 Units by mouth daily.   CYCLOBENZAPRINE (FLEXERIL) 10 MG TABLET    Take 0.5 tablets (5 mg total) by mouth at bedtime as needed for muscle spasms.   HYDROCHLOROTHIAZIDE (HYDRODIURIL) 25 MG TABLET    Take 1 tablet (25 mg total) by mouth every morning.   POLYETHYL GLYCOL-PROPYL GLYCOL (SYSTANE ULTRA OP)    Apply 1-2 drops to eye once as needed (dry eyes.).    POTASSIUM CHLORIDE (K-DUR) 10 MEQ TABLET    Take 10 mEq by mouth every morning. Take as directed   PRAVASTATIN (PRAVACHOL) 80 MG TABLET    Take 80 mg by mouth every evening.    WARFARIN (COUMADIN) 5 MG TABLET    TAKE AS DIRECTED BY COUMADIN CLINIC  Modified Medications   Modified Medication Previous Medication   TRAMADOL (ULTRAM) 50 MG TABLET traMADol (ULTRAM) 50 MG tablet      Take 1 tablet (50 mg total) by mouth every 6 (six) hours as needed for severe pain.    Take 1 tablet (50 mg total) by mouth every 8 (eight) hours as needed for severe pain.  Discontinued  Medications   PREDNISONE (DELTASONE) 10 MG TABLET    Take 3 tablets for 2 days, 2 tablets for 2 days, 1 tablet for 2 days.

## 2015-05-11 NOTE — Telephone Encounter (Signed)
Patient's wife called.  Patient has been seen 3 times in the last week for back pain and legs.  Patient's can't straighten up and walk and has pain when he moves.  Patient is taking pain medicine and it's not helping.  Patient wants to have an MRI done.  He tried to call Dr.Elsner's office,but they won't give him an appointment.  Patient has appointment this afternoon with Dr.Copland, but they can't wait until 3:15. Patient's wife wanted to make sure that Dr.Elsner received patient's x-ray report.  Patient's wife is requesting to speak to [place]. Patient doesn't want to go to the ER.

## 2015-05-12 ENCOUNTER — Encounter: Payer: Self-pay | Admitting: Family Medicine

## 2015-05-12 ENCOUNTER — Ambulatory Visit
Admission: RE | Admit: 2015-05-12 | Discharge: 2015-05-12 | Disposition: A | Discharge status: Home / Self Care | Payer: Medicare Other | Source: Ambulatory Visit | Attending: Family Medicine | Admitting: Family Medicine

## 2015-05-12 ENCOUNTER — Telehealth: Payer: Self-pay | Admitting: *Deleted

## 2015-05-12 DIAGNOSIS — M5440 Lumbago with sciatica, unspecified side: Secondary | ICD-10-CM

## 2015-05-12 DIAGNOSIS — M4806 Spinal stenosis, lumbar region: Secondary | ICD-10-CM | POA: Diagnosis not present

## 2015-05-12 NOTE — Telephone Encounter (Signed)
I have high confidence in the MD's at [facility], Dr. Ellene Route included.

## 2015-05-12 NOTE — Telephone Encounter (Signed)
Patient was advised of imaging results and is not sure if he would like to see another clinic other than Dr. Clarice Pole.  Patient would like to know if you have a recommendation?

## 2015-05-12 NOTE — Telephone Encounter (Addendum)
Patient advised.  Patient says he will contact Dr. Clarice Pole office and patient was instructed that if he needs a referral, please let us know.

## 2015-05-13 NOTE — Telephone Encounter (Signed)
Spoke to pt. He wanted to see if Dr. Josefine Class office would have any pull to get his 06/04/15 appointment moved up.  I called Dr. Wallene Huh office and that is the absolute soonest appt that he has. Pt is on cancellation list.  Called pt back and I suggested pt call everyday to ask for cancellations. Pt agreed and thanked me for trying.

## 2015-05-13 NOTE — Telephone Encounter (Signed)
Mrs Croucher left v/m requesting cb from pt care coordinator about Dr Clarice Pole appt.

## 2015-05-14 ENCOUNTER — Ambulatory Visit (INDEPENDENT_AMBULATORY_CARE_PROVIDER_SITE_OTHER): Payer: Medicare Other | Admitting: *Deleted

## 2015-05-14 DIAGNOSIS — Z7901 Long term (current) use of anticoagulants: Secondary | ICD-10-CM

## 2015-05-14 DIAGNOSIS — Z5181 Encounter for therapeutic drug level monitoring: Secondary | ICD-10-CM | POA: Diagnosis not present

## 2015-05-14 DIAGNOSIS — I4891 Unspecified atrial fibrillation: Secondary | ICD-10-CM

## 2015-05-14 DIAGNOSIS — M5127 Other intervertebral disc displacement, lumbosacral region: Secondary | ICD-10-CM | POA: Diagnosis not present

## 2015-05-14 LAB — POCT INR: INR: 2.8

## 2015-05-18 DIAGNOSIS — M4726 Other spondylosis with radiculopathy, lumbar region: Secondary | ICD-10-CM | POA: Diagnosis not present

## 2015-05-18 DIAGNOSIS — M5416 Radiculopathy, lumbar region: Secondary | ICD-10-CM | POA: Diagnosis not present

## 2015-05-18 DIAGNOSIS — M5116 Intervertebral disc disorders with radiculopathy, lumbar region: Secondary | ICD-10-CM | POA: Diagnosis not present

## 2015-05-21 ENCOUNTER — Telehealth: Payer: Self-pay | Admitting: Family Medicine

## 2015-05-21 ENCOUNTER — Telehealth: Payer: Self-pay | Admitting: *Deleted

## 2015-05-21 NOTE — Telephone Encounter (Signed)
I will defer to Dr. Ellene Route.  If Elsner thinks the pred is a reasonable option, then I'll defer to him.  Thanks.

## 2015-05-21 NOTE — Telephone Encounter (Signed)
Patient says he had an injection in his back that did help his pain from Dr. Ellene Route.  Patient is still having pain that is keeping him from sleeping.  Patient contacted Dr. Clarice Pole office and they offered a higher dose of pain medication that he is not comfortable taking.  Patient is scheduled to return to Dr. Ellene Route 06/10/15.  Mr. Cederquist is asking if it would be reasonable to have more Prednisone and continue using the Tramadol or should he just follow directions from Dr. Ellene Route?

## 2015-05-21 NOTE — Telephone Encounter (Signed)
Patient advised.

## 2015-05-21 NOTE — Telephone Encounter (Signed)
Patient is asking for Lugene to call him.  Patient has a question about his medication.

## 2015-05-26 ENCOUNTER — Ambulatory Visit (INDEPENDENT_AMBULATORY_CARE_PROVIDER_SITE_OTHER): Payer: Medicare Other

## 2015-05-26 DIAGNOSIS — Z7901 Long term (current) use of anticoagulants: Secondary | ICD-10-CM

## 2015-05-26 DIAGNOSIS — R3912 Poor urinary stream: Secondary | ICD-10-CM | POA: Diagnosis not present

## 2015-05-26 DIAGNOSIS — I4891 Unspecified atrial fibrillation: Secondary | ICD-10-CM

## 2015-05-26 DIAGNOSIS — Z0000 Encounter for general adult medical examination without abnormal findings: Secondary | ICD-10-CM | POA: Diagnosis not present

## 2015-05-26 DIAGNOSIS — N200 Calculus of kidney: Secondary | ICD-10-CM | POA: Diagnosis not present

## 2015-05-26 DIAGNOSIS — Z5181 Encounter for therapeutic drug level monitoring: Secondary | ICD-10-CM | POA: Diagnosis not present

## 2015-05-26 DIAGNOSIS — N401 Enlarged prostate with lower urinary tract symptoms: Secondary | ICD-10-CM | POA: Diagnosis not present

## 2015-05-26 DIAGNOSIS — N281 Cyst of kidney, acquired: Secondary | ICD-10-CM | POA: Diagnosis not present

## 2015-05-26 LAB — POCT INR: INR: 2.4

## 2015-05-28 DIAGNOSIS — M5127 Other intervertebral disc displacement, lumbosacral region: Secondary | ICD-10-CM | POA: Diagnosis not present

## 2015-05-29 ENCOUNTER — Other Ambulatory Visit: Payer: Self-pay | Admitting: Internal Medicine

## 2015-05-29 ENCOUNTER — Telehealth: Payer: Self-pay | Admitting: Pharmacist

## 2015-05-29 ENCOUNTER — Telehealth: Payer: Self-pay | Admitting: *Deleted

## 2015-05-29 NOTE — Telephone Encounter (Signed)
Received fax from [facility] that pt is having a left L3-4 microdiskectomy by Dr. Kristeen Miss on 06/05/15. Pt is on Coumadin for afib with CHADS2 score of 2 - he can hold Coumadin x5 days prior to procedure with no Lovenox bridge. Will route note to Dr. Caryl Comes for cardiac clearance prior to surgery.  [place] Phone# [phone] Fax# [phone]

## 2015-05-29 NOTE — Telephone Encounter (Signed)
Pt called stating he is scheduled for back surgery on next Friday Feb 10.2017 .  This nurse called and spoke with Janett Billow at Dr Clarice Pole office and Janett Billow states pt was instructed to hold coumadin for 7 days by Dr Ellene Route and cardiac clearance sent to Dr Caryl Comes today . Asked Janett Billow to send clearance form to coumadin clinic as well. Janett Billow states that could hold coumadin for 5 days and she is messaging Dr Ellene Route regarding this as well  Clearance for holding coumadin given per [name] [name] that may hold coumadin for 5 days and she will message Dr Caryl Comes regarding obtaining cardiac clearance. Jessica at Dr Clarice Pole office made aware of above Pt is scheduled for Left L3-4 Microdiskectomy on Feb 10th and per Janett Billow need to have INR checked in our office on Feb 10th Spoke with pt and instructed last day to take coumadin is Saturday Feb 4th then hold coumadin until procedure and then when instructed by Dr Ellene Route to restart coumadin restart at same dose .Appt made for an INR check on Feb 10th prior to procedure and pt states understanding of above instructions

## 2015-06-01 NOTE — Telephone Encounter (Signed)
It has been a yrea since we have seen him,  Will need to see pa for cardiac clearance

## 2015-06-02 ENCOUNTER — Ambulatory Visit (INDEPENDENT_AMBULATORY_CARE_PROVIDER_SITE_OTHER): Payer: Medicare Other | Admitting: Physician Assistant

## 2015-06-02 ENCOUNTER — Encounter: Payer: Self-pay | Admitting: Physician Assistant

## 2015-06-02 VITALS — BP 150/70 | HR 68 | Ht 71.0 in | Wt 194.4 lb

## 2015-06-02 DIAGNOSIS — E785 Hyperlipidemia, unspecified: Secondary | ICD-10-CM

## 2015-06-02 DIAGNOSIS — Z01810 Encounter for preprocedural cardiovascular examination: Secondary | ICD-10-CM | POA: Diagnosis not present

## 2015-06-02 DIAGNOSIS — I2510 Atherosclerotic heart disease of native coronary artery without angina pectoris: Secondary | ICD-10-CM | POA: Diagnosis not present

## 2015-06-02 DIAGNOSIS — I480 Paroxysmal atrial fibrillation: Secondary | ICD-10-CM | POA: Diagnosis not present

## 2015-06-02 DIAGNOSIS — I10 Essential (primary) hypertension: Secondary | ICD-10-CM

## 2015-06-02 DIAGNOSIS — I350 Nonrheumatic aortic (valve) stenosis: Secondary | ICD-10-CM

## 2015-06-02 DIAGNOSIS — I4729 Other ventricular tachycardia: Secondary | ICD-10-CM

## 2015-06-02 DIAGNOSIS — I472 Ventricular tachycardia: Secondary | ICD-10-CM

## 2015-06-02 NOTE — Telephone Encounter (Signed)
Pt seen in [facility] today by Richardson Dopp - see clinic note from 06/02/15 for cardiac clearance. Clearance form was faxed today.

## 2015-06-02 NOTE — Patient Instructions (Addendum)
Medication Instructions:  No changes.   Follow the instructions given to you from the pharmacist regarding the Coumadin. See the medication list.  Labwork: None   Testing/Procedures: None   Follow-Up: Dr. Virl Axe 3 months.   Any Other Special Instructions Will Be Listed Below (If Applicable). If your dizziness does not get better, Elsie Stain, MD or our office can refer you to Vestibular Rehabilitation.  Call if you need [place] to do this for you.  If you need a refill on your cardiac medications before your next appointment, please call your pharmacy.

## 2015-06-02 NOTE — Progress Notes (Signed)
Cardiology Office Note:    Date:  06/02/2015   ID:  Oluwatobi, Kindschi 06/04/1934, MRN [id number]  PCP:  Elsie Stain, MD  Cardiologist/Electrophysiologist:  Dr. Virl Axe   Referring MD:  Dr. Kristeen Miss   Chief Complaint  Patient presents with  . Surgical Clearance    Consult    History of Present Illness:     [name] [name] is a 81 y.o. male with a hx of CAD s/p CABG 2004, PAF, RVOT VTach, Amiodarone and Coumadin Rx, intol to ACE inhibitors due to cough, aortic stenosis, HTN, HL.  Last LHC in 2007 demonstrated patent bypass grafts. Last nuclear stress test was 2/15 and demonstrated no ischemia. Last Echo in 2/16 demonstrated normal LVF and mild AS with mean gradient 17 mmHg.  Last seen by Dr. Virl Axe in 4/16.    He is scheduled for L3-4 microdiskectomy with Dr. Ellene Route 06/05/15. He is scheduled today for surgical clearance.    CHADS2-VASc=4 (annual 4.8% stroke risk).  Coumadin managed by our Coumadin clinic.  Patient has already received instructions to hold Coumadin for 5 days prior to surgery.  No hx of CVA and he does not need Lovenox bridging.  Here with his wife. He has been doing well.  Until he hurt his back, he was very active.  He had no limitations to activities.  The patient denies chest pain, shortness of breath, syncope, orthopnea, PND or significant pedal edema.  He did wake up today with spinning sensation c/w vertigo.  He has noted some L ear congestion recently as well.      Past Medical History  Diagnosis Date  . HTN (hypertension)   . Paroxysmal atrial fibrillation (HCC)   . Mild aortic stenosis   . Coronary artery disease CARDIOLOGIST-  DR KLEIN/ [facility]    a. s/p CABG 2004, b. LHC with patent grafts 07/2005, ejection fraction 50%;  c. Myoview 2/15: no ischemia, EF 61%  . S/P CABG x 5 2004  . Aortic stenosis     a. Echo 2/15: Severe LVH, EF 55-60%, Gr 1 DD, mild to mod AS (mean 20 mmHg, peak 31 mmHg), AVA 1.4-1.5 cm2, mild AI, MAC,  trivial MR, LA upper limits of normal, RVSP 22 mmHg, mild RAE;   b. Echo 2/16:  EF 55-60%, no RWMA, mild AS (mean 17 mmHg, peak 27 mmHg), AVA 1.5 cm2, mild AI, MAC, mild LAE  . 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 Amiodarone Rx  . Dyslipidemia   . Anticoagulated on Coumadin     followed in [facility] at [facility]  . GERD (gastroesophageal reflux disease)   . Chronic cough     NO CARDIAC OR PULMONARY RELATED  . Bladder neoplasm   . Arthritis   . Dry eyes   . PAF (paroxysmal atrial fibrillation) ([place])     CHADS2-VASc:  4    Past Surgical History  Procedure Laterality Date  . Electrophysiology study  07-27-2005  DR Cristopher Peru    MAPPING --   RESULT NONINDUCIBLE VT OR SVT/  DX RIGHT VENTRICULAR OUTFLOW TRACT VT AND RULES OUT MORE MALIGNANT CAUSES OF VT  . Lumbar disc surgery  1999    L4 -- L5  . Inguinal hernia repair Bilateral 1954  &  1990  . Knee arthroscopy Right 1994  . Coronary artery bypass graft  03-08-2003  DR [id number]    LIMA TO LAD/ SVG TO OM2/  SVG TO DIAGONAL /  SVG TO PDA & PLA  . Removal vocal cord polyps  1978  . Transthoracic echocardiogram  08-08-2011    MILD LVH/  EF [id number]  GRADE I DIASTOLIC DYSFUNCTION/ MILD AV STENOSIS  . Cardiac catheterization  07-26-2005  DR GAMBLE    PRESERVED LVF/  EF 50%/ PATENT GRAFTS  . Cardiac catheterization  03-04-2003  DR GAMBLE    SEVERE 3 VESSEL DISEASE  . Cardiac catheterization  [place]    PAF/ FALSE POSITIVE STRESS TEST  . Cataract extraction w/ intraocular lens implant Right   . Tonsillectomy  AS CHILD  . Cystoscopy with biopsy N/A 12/25/2012    Procedure: CYSTOSCOPY WITH BLADDER BIOPSY;  Surgeon: Fredricka Bonine, MD;  [facility]  Service: Urology;  Laterality: N/A;    Current Medications: Outpatient Prescriptions Prior to Visit  Medication Sig Dispense Refill  . acetaminophen (TYLENOL) 325 MG tablet Take 650 mg by mouth once as needed  for moderate pain or headache.    Marland Kitchen amiodarone (PACERONE) 200 MG tablet Take 100 mg by mouth every morning.     . butalbital-acetaminophen-caffeine (FIORICET, ESGIC) 50-325-40 MG per tablet Take 1 tablet by mouth once as needed for headache.     . cetirizine (ZYRTEC) 10 MG tablet Take 10 mg by mouth daily.    . cholecalciferol (VITAMIN D) 1000 UNITS tablet Take 1,000 Units by mouth daily.    . hydrochlorothiazide (HYDRODIURIL) 25 MG tablet TAKE 1 TABLET (25 MG TOTAL) BY MOUTH EVERY MORNING. 90 tablet 0  . Polyethyl Glycol-Propyl Glycol (SYSTANE ULTRA OP) Apply 1-2 drops to eye once as needed (dry eyes.).     Marland Kitchen potassium chloride (K-DUR) 10 MEQ tablet Take 10 mEq by mouth every morning. Take as directed    . pravastatin (PRAVACHOL) 80 MG tablet Take 80 mg by mouth every evening.     . cyclobenzaprine (FLEXERIL) 10 MG tablet Take 0.5 tablets (5 mg total) by mouth at bedtime as needed for muscle spasms. (Patient not taking: Reported on 05/11/2015) 15 tablet 0  . predniSONE (DELTASONE) 20 MG tablet 2 tabs po for 5 days, then 1 tab po for 5 days (Patient not taking: Reported on 06/02/2015) 15 tablet 0  . traMADol (ULTRAM) 50 MG tablet Take 1 tablet (50 mg total) by mouth every 6 (six) hours as needed for severe pain. (Patient not taking: Reported on 06/02/2015) 50 tablet 1  . warfarin (COUMADIN) 5 MG tablet TAKE AS DIRECTED BY COUMADIN CLINIC (Patient not taking: Reported on 06/02/2015) 120 tablet 1   No facility-administered medications prior to visit.     Allergies:   Ace inhibitors; Hycodan; Zocor; and Cephalexin   Social History   Social History  . Marital Status: Married    Spouse Name: N/A  . Number of Children: 3  . Years of Education: N/A   Occupational History  . Retired    Social History Main Topics  . Smoking status: Former Smoker -- 1.00 packs/day for 25 years    Types: Cigarettes    Quit date: 04/25/1980  . Smokeless tobacco: Never Used  . Alcohol Use: No  . Drug Use: No  .  Sexual Activity: Not Asked   Other Topics Concern  . None   Social History Narrative   [employer]56-'58, overseas to [place]   Some care through [place]   No service disability     Family History:  The patient's family history includes Cancer in his mother.   ROS:  Please see the history of present illness.    Review of Systems  HENT: Positive for headaches.   Respiratory: Positive for cough.   Hematologic/Lymphatic: Bruises/bleeds easily.  Musculoskeletal: Positive for back pain and joint pain.  Neurological: Positive for dizziness.  All other systems reviewed and are negative.   Physical Exam:    VS:  BP 150/70 mmHg  Pulse 68  Ht 5\' 11"  (1.803 m)  Wt 194 lb 6.4 oz (88.179 kg)  BMI 27.13 kg/m2   GEN: Well nourished, well developed, in no acute distress HEENT: normal Neck: no JVD, no masses Cardiac: Normal S1/S2, RRR; 2/6 crescendo decrescendo systolic murmur RSUB,  no edema    Respiratory:  clear to auscultation bilaterally; no wheezing, rhonchi or rales GI: soft, nontender  MS: no deformity or atrophy Skin: warm and dry, no rash Neuro:    no focal deficits  Psych: Alert and oriented x 3, normal affect  Wt Readings from Last 3 Encounters:  06/02/15 194 lb 6.4 oz (88.179 kg)  05/11/15 199 lb 1.9 oz (90.32 kg)  05/06/15 199 lb (90.266 kg)      Studies/Labs Reviewed:     EKG:  EKG is  ordered today.  The ekg ordered today demonstrates NSR, HR 68, normal axis, incomplete RBBB, nonspecific ST-T wave changes, QTc 452 ms  Recent Labs: 12/18/2014: ALT 16; Creatinine 1.0; Hemoglobin 14.7; TSH 5.89   Recent Lipid Panel    Component Value Date/Time   CHOL 166 12/18/2014   TRIG 75 12/18/2014   TRIG 96 11/27/2013   HDL 65 12/18/2014   LDLCALC 86 12/18/2014   LDLCALC 78 11/27/2013    Additional studies/ records that were reviewed today include:   Echo 06/13/14 EF 55-60%, no RWMA, mild AS (mean 17 mmHg, peak 27 mmHg), AVA 1.5 cm2, mild AI, MAC, mild LAE  Echo  2/15 Severe LVH, EF 55-60%, Gr 1 DD, mild to mod AS (mean 20 mmHg, peak 31 mmHg), AVA 1.4-1.5 cm2, mild AI, MAC, trivial MR, LA upper limits of normal, RVSP 22 mmHg, mild RAE  Myoview 2/15 EF 61%, no ischemia, Normal  Event monitor 5/13 AFib, PVCs  LHC 4/07 LAD occluded RCA mid 75% L-LAD patent S-Dx patent S-OM2 patent S-RCA patent  EF 50%   ASSESSMENT:     1. Pre-operative cardiovascular examination   2. Coronary artery disease involving native coronary artery of native heart without angina pectoris   3. PAF (paroxysmal atrial fibrillation) ([place])   4. Aortic stenosis   5. Essential hypertension   6. RVOT ventricular tachycardia ([place])   7. Hyperlipidemia     PLAN:     In order of problems listed above:  1. Surgical Clearance - Patient is scheduled for Lumbar spine surgery this Friday 06/05/15 with Dr. Ellene Route.  The patient does not have any unstable cardiac conditions.  He had a low risk nuclear stress test 2 years ago.  Last echo 12 months ago demonstrated just mild AS.  He has no symptoms to suggest any worsening.  Upon evaluation today, he can achieve 4 METs or greater without anginal symptoms.  According to [facility] and AHA guidelines, he requires no further cardiac workup prior to his noncardiac surgery and should be at acceptable risk.  Our service is available as necessary in the perioperative period.   2. CAD - s/p CABG in 2004 and patent grafts by [place] in 2007.  Myoview in 2015 low risk and neg for ischemia. He is not on ASA as he is  on Coumadin.  Continue statin.  He denies anginal symptoms.   3. PAF - In NSR today.  Continue long term Coumadin given stroke risk.  Coumadin on hold for Neurosurgical procedure.  This should be resumed once felt to be safe.  He remains on Amiodarone.    4. Aortic stenosis - Echo in 2/16 with mild AS and mean gradient 17 mmHg.  No symptoms to suggest worsening AS.  5. HTN - BP elevated today. BP is usually well controlled.  Continue to  monitor.    6. RVOT Ventricular Tachycardia - Long term Amiodarone therapy.  No apparent recurrence   7. HL - Continue statin.    8. BPPV - Vertigo symptoms started today.  I offered to refer him to Vestibular Rehab.  He would like to hold off.  I encouraged him to apprise Dr. Ellene Route of his vertigo as his surgery is scheduled in a few days.    Medication Adjustments/Labs and Tests Ordered: Current medicines are reviewed at length with the patient today.  Concerns regarding medicines are outlined above.  Medication changes, Labs and Tests ordered today are outlined in the Patient Instructions noted below. Patient Instructions  Medication Instructions:  No changes.   Follow the instructions given to you from the pharmacist regarding the Coumadin. See the medication list.  Labwork: None   Testing/Procedures: None   Follow-Up: Dr. Virl Axe 3 months.   Any Other Special Instructions Will Be Listed Below (If Applicable). If your dizziness does not get better, Elsie Stain, MD or our office can refer you to Vestibular Rehabilitation.  Call if you need [place] to do this for you.  If you need a refill on your cardiac medications before your next appointment, please call your pharmacy.     Signed, Richardson Dopp, PA-C  06/02/2015 2:54 PM    [facility] [address], [place], [place]  [address] Phone: [phone]; Fax: [phone]

## 2015-06-02 NOTE — Telephone Encounter (Signed)
Pt scheduled to see Richardson Dopp today at 2pm for cardiac clearance prior to his back surgery on Friday.

## 2015-06-03 ENCOUNTER — Ambulatory Visit (INDEPENDENT_AMBULATORY_CARE_PROVIDER_SITE_OTHER): Payer: Medicare Other | Admitting: Family Medicine

## 2015-06-03 ENCOUNTER — Encounter: Payer: Self-pay | Admitting: Family Medicine

## 2015-06-03 ENCOUNTER — Telehealth: Payer: Self-pay

## 2015-06-03 VITALS — BP 142/72 | HR 78 | Temp 98.8°F | Ht 71.0 in | Wt 194.8 lb

## 2015-06-03 DIAGNOSIS — I2510 Atherosclerotic heart disease of native coronary artery without angina pectoris: Secondary | ICD-10-CM | POA: Diagnosis not present

## 2015-06-03 DIAGNOSIS — R42 Dizziness and giddiness: Secondary | ICD-10-CM | POA: Diagnosis not present

## 2015-06-03 DIAGNOSIS — L989 Disorder of the skin and subcutaneous tissue, unspecified: Secondary | ICD-10-CM

## 2015-06-03 DIAGNOSIS — R634 Abnormal weight loss: Secondary | ICD-10-CM | POA: Diagnosis not present

## 2015-06-03 NOTE — Telephone Encounter (Signed)
PLEASE NOTE: All timestamps contained within this report are represented as [place] Standard Time. CONFIDENTIALTY NOTICE: This fax transmission is intended only for the addressee. It contains information that is legally privileged, confidential or otherwise protected from use or disclosure. If you are not the intended recipient, you are strictly prohibited from reviewing, disclosing, copying using or disseminating any of this information or taking any action in reliance on or regarding this information. If you have received this fax in error, please notify us immediately by telephone so that we can arrange for its return to [place]. Phone: [phone], Toll-Free: [phone], Fax: [phone] Page: 1 of 2 Call Id: [id number] [place] Patient Name: [name] [name] Gender: Male DOB: 27-Sep-1934 Age: 81 Y 51 M 19 D Return Phone Number: [phone] (Primary) Address: City/State/Zip: [place] Client [place] [facility] - Client Client Site [place] - Day Physician Renford Dills Contact Type Call Who Is Calling Patient / Member / Family / Caregiver Call Type Triage / Clinical Relationship To Patient Self Return Phone Number [phone] (Primary) Chief Complaint Dizziness Reason for Call Symptomatic / Request for [place] states he has been scheduled for back surgery on Friday. On Monday night he had some extreme vertigo and it got better. Over all he doesn't have a clear head and is still feeling a little dizzy. Appointment Disposition EMR Appointment Scheduled Info pasted into Epic No PreDisposition Did not know what to do Translation No Nurse Assessment Nurse: Wayne Sever, RN, Tillie Rung Date/Time (Eastern Time): 06/03/2015 1:00:07 PM Confirm and document reason for call. If symptomatic, describe symptoms. You must click the next button  to save text entered. ---Caller states he already got an appointment today with Dr Sallee Lange at [facility]. He states he is scheduled for back surgery on Friday. He states he had vertigo on Monday night. He states it was better on Tuesday. He talked with the surgeon this am and they suggested he see his primary doctor. He then called back and scheduled an appointment. Has the patient traveled out of the country within the last 30 days? ---Not Applicable Does the patient have any new or worsening symptoms? ---Yes Will a triage be completed? ---Yes Related visit to physician within the last 2 weeks? ---No Does the PT have any chronic conditions? (i.e. diabetes, asthma, etc.) ---Yes List chronic conditions. ---A.Fib, Allergies, HTN, Is this a behavioral health or substance abuse call? ---No PLEASE NOTE: All timestamps contained within this report are represented as [place] Standard Time. CONFIDENTIALTY NOTICE: This fax transmission is intended only for the addressee. It contains information that is legally privileged, confidential or otherwise protected from use or disclosure. If you are not the intended recipient, you are strictly prohibited from reviewing, disclosing, copying using or disseminating any of this information or taking any action in reliance on or regarding this information. If you have received this fax in error, please notify us immediately by telephone so that we can arrange for its return to [place]. Phone: [phone], Toll-Free: [phone], Fax: [phone] Page: 2 of 2 Call Id: [id number] Guidelines Guideline Title Affirmed Question Affirmed Notes Nurse Date/Time Eilene Ghazi Time) Dizziness - Vertigo [1] MODERATE dizziness (e.g., vertigo; feels very unsteady, interferes with normal activities) AND [2] has NOT been evaluated by physician for this Billee Cashing 06/03/2015 1:02:35 PM Disp. Time Eilene Ghazi Time) Disposition Final User 06/03/2015 1:03:51 PM See Physician within  24 Hours Yes  Wayne Sever, RN, Mable Paris Understands: Yes Disagree/Comply: Comply Care Advice Given Per Guideline * IF OFFICE WILL BE OPEN: You need to be seen within the next 24 hours. Call your doctor when the office opens, and make an appointment. SEE PHYSICIAN WITHIN 24 HOURS: BRING MEDICINES: * Please bring a list of your current medicines when you go to see the doctor. CALL BACK IF: * Severe headache occurs * Weakness develops in an arm or leg * Unable to walk without falling * You become worse. CARE ADVICE given per Dizziness - Vertigo (Adult) guideline. Comments User: Franco Nones, RN Date/Time Eilene Ghazi Time): 06/03/2015 1:06:34 PM Did not paste information in EPIC as the system is not working right now Referrals REFERRED TO PCP OFFICE

## 2015-06-03 NOTE — Patient Instructions (Signed)
Nice to meet you. Your spinning sensation is likely related to vertigo and likely related to an inner ear issue. We can send you to vestibular rehabilitation. You should continue to monitor this. If he gets worse he needed a letter surgeon know. You need to see a dermatologist for that area on your leg. You need to monitor your weight. I would recommend following up with your PCP next week for weight check and to discuss this. If you develop persistent spinning, numbness, weakness, headaches, chest pain, shortness breath, palpitations, or any new or changing symptoms please seek medical attention.

## 2015-06-03 NOTE — Telephone Encounter (Signed)
Pt had appt with Dr Caryl Bis 06/03/15 at 3 pm.

## 2015-06-03 NOTE — Progress Notes (Signed)
Pre visit review using our clinic review tool, if applicable. No additional management support is needed unless otherwise documented below in the visit note. 

## 2015-06-04 DIAGNOSIS — R634 Abnormal weight loss: Secondary | ICD-10-CM | POA: Insufficient documentation

## 2015-06-04 DIAGNOSIS — L989 Disorder of the skin and subcutaneous tissue, unspecified: Secondary | ICD-10-CM | POA: Insufficient documentation

## 2015-06-04 NOTE — Assessment & Plan Note (Signed)
Resolving. Minimal symptoms today. Suspect BPPV. Unlikely central process given benign neuro exam. Saw cardiology yesterday and unlikely related to cardiac cause. Advised of anatomy and likely cause. Discussed that if he is continuing to improve this would likely not preclude him from having surgery later this week. Advised that it would be up to his surgeon and the anesthesiologist to determine if this would prevent him from having surgery. Advised that he needed to discuss this with the surgeon and the anesthesiologist when he presents for surgery and advise them of his symptoms. We'll fax this note to his neurosurgeon Dr. Ellene Route.

## 2015-06-04 NOTE — Telephone Encounter (Signed)
See office visit

## 2015-06-04 NOTE — Assessment & Plan Note (Signed)
Patient is down 8 pounds in the last week. Discussed that he is still within the range that he has been in the last several years. He will continue to monitor. If he continues to lose weight over the next 1-2 weeks he will need to follow-up with his primary care physician for further evaluation.

## 2015-06-04 NOTE — Progress Notes (Signed)
Patient ID: [name] [name], male   DOB: [birth year], 81 y.o.   MRN: [id number]  [name] Rumps, MD Phone: [phone]  [name] [name] is a 81 y.o. male who presents today for same-day visit.  Vertigo: Patient notes 2 days ago he woke up in the middle of the night and walked to the bathroom and then suddenly felt the room spinning around him. Notes he laid down and focused on a single-point and the spinning got better. Notes each day has gotten progressively better. Minimal vertigo today. Does note feeling mildly "foggy headed." With some mild congestion. Symptoms only occur with change in head position. Minimal left ear crackling sensation. No decreased hearing or tinnitus. No numbness, weakness, or vision changes. No chest pain, shortness of breath, or palpitations. Notes he saw cardiology yesterday who cleared him from a cardiac perspective for his upcoming surgery. Asian does note he has a history of vertigo in the past.  Skin lesion right anterior shin: Notices several days ago. There is a black area. He does not remember any trauma to this area. He thinks it might be getting slightly larger. Mildly hurts. He has a history of basal cell carcinoma. Ex  Patient additionally notes an 8 pound weight loss in the last week. On review of his weights in the past and it appears that he has fluctuated anywhere between 192 pounds and 209 pounds. Weighs 194 pounds today.  PMH: Former smoker.   ROS see history of present illness  Objective  Physical Exam Filed Vitals:   06/03/15 1456  BP: 142/72  Pulse: 78  Temp: 98.8 F (37.1 C)    Physical Exam  Constitutional: He is well-developed, well-nourished, and in no distress.  HENT:  Head: Normocephalic and atraumatic.  Right Ear: External ear normal.  Left Ear: External ear normal.  Mouth/Throat: Oropharynx is clear and moist. No oropharyngeal exudate.  Normal TMs bilaterally  Eyes: Conjunctivae are normal. Pupils are equal, round,  and reactive to light.  Neck: Neck supple.  Cardiovascular: Normal rate, regular rhythm and normal heart sounds.  Exam reveals no gallop and no friction rub.   No murmur heard. Pulmonary/Chest: Effort normal and breath sounds normal. No respiratory distress. He has no wheezes. He has no rales.  Lymphadenopathy:    He has no cervical adenopathy.  Neurological: He is alert.  CN 2-12 intact, 5/5 strength in bilateral biceps, triceps, grip, quads, hamstrings, plantar and dorsiflexion, sensation to light touch intact in bilateral UE and LE, normal gait, 2+ patellar reflexes, negative Romberg, negative Dix-Hallpike  Skin: Skin is warm and dry. He is not diaphoretic.  Right anterior shin with 1 cm diameter black lesion that is mildly tender with no surrounding erythema, similar smaller lesion just inferior to this     Assessment/Plan: Please see individual problem list.  Vertigo Resolving. Minimal symptoms today. Suspect BPPV. Unlikely central process given benign neuro exam. Saw cardiology yesterday and unlikely related to cardiac cause. Advised of anatomy and likely cause. Discussed that if he is continuing to improve this would likely not preclude him from having surgery later this week. Advised that it would be up to his surgeon and the anesthesiologist to determine if this would prevent him from having surgery. Advised that he needed to discuss this with the surgeon and the anesthesiologist when he presents for surgery and advise them of his symptoms. We'll fax this note to his neurosurgeon Dr. Ellene Route.  Skin lesion Suspect traumatic cause with eschar formation for the lesions on  his leg, though with his history of skin cancer I have advised him that he needs to follow-up with his dermatologist as soon as possible for evaluation.  Loss of weight Patient is down 8 pounds in the last week. Discussed that he is still within the range that he has been in the last several years. He will continue to  monitor. If he continues to lose weight over the next 1-2 weeks he will need to follow-up with his primary care physician for further evaluation.     [name] [name]

## 2015-06-04 NOTE — Assessment & Plan Note (Signed)
Suspect traumatic cause with eschar formation for the lesions on his leg, though with his history of skin cancer I have advised him that he needs to follow-up with his dermatologist as soon as possible for evaluation.

## 2015-06-05 ENCOUNTER — Ambulatory Visit (INDEPENDENT_AMBULATORY_CARE_PROVIDER_SITE_OTHER): Payer: Medicare Other | Admitting: *Deleted

## 2015-06-05 DIAGNOSIS — I480 Paroxysmal atrial fibrillation: Secondary | ICD-10-CM

## 2015-06-05 DIAGNOSIS — M5116 Intervertebral disc disorders with radiculopathy, lumbar region: Secondary | ICD-10-CM | POA: Diagnosis not present

## 2015-06-05 DIAGNOSIS — I4891 Unspecified atrial fibrillation: Secondary | ICD-10-CM | POA: Diagnosis not present

## 2015-06-05 DIAGNOSIS — Z7901 Long term (current) use of anticoagulants: Secondary | ICD-10-CM

## 2015-06-05 DIAGNOSIS — M5126 Other intervertebral disc displacement, lumbar region: Secondary | ICD-10-CM | POA: Diagnosis not present

## 2015-06-05 LAB — POCT INR: INR: 1

## 2015-06-11 ENCOUNTER — Other Ambulatory Visit (INDEPENDENT_AMBULATORY_CARE_PROVIDER_SITE_OTHER): Payer: Medicare Other

## 2015-06-11 DIAGNOSIS — R946 Abnormal results of thyroid function studies: Secondary | ICD-10-CM | POA: Diagnosis not present

## 2015-06-11 DIAGNOSIS — R7989 Other specified abnormal findings of blood chemistry: Secondary | ICD-10-CM

## 2015-06-11 LAB — TSH: TSH: 1.66 u[IU]/mL (ref 0.35–4.50)

## 2015-06-12 ENCOUNTER — Ambulatory Visit (INDEPENDENT_AMBULATORY_CARE_PROVIDER_SITE_OTHER): Payer: Medicare Other | Admitting: *Deleted

## 2015-06-12 DIAGNOSIS — I4891 Unspecified atrial fibrillation: Secondary | ICD-10-CM

## 2015-06-12 DIAGNOSIS — I480 Paroxysmal atrial fibrillation: Secondary | ICD-10-CM

## 2015-06-12 DIAGNOSIS — Z7901 Long term (current) use of anticoagulants: Secondary | ICD-10-CM | POA: Diagnosis not present

## 2015-06-12 LAB — POCT INR: INR: 1.5

## 2015-06-24 ENCOUNTER — Ambulatory Visit (INDEPENDENT_AMBULATORY_CARE_PROVIDER_SITE_OTHER): Payer: Medicare Other | Admitting: *Deleted

## 2015-06-24 DIAGNOSIS — I480 Paroxysmal atrial fibrillation: Secondary | ICD-10-CM

## 2015-06-24 DIAGNOSIS — C44729 Squamous cell carcinoma of skin of left lower limb, including hip: Secondary | ICD-10-CM | POA: Diagnosis not present

## 2015-06-24 DIAGNOSIS — Z7901 Long term (current) use of anticoagulants: Secondary | ICD-10-CM

## 2015-06-24 DIAGNOSIS — Z85828 Personal history of other malignant neoplasm of skin: Secondary | ICD-10-CM | POA: Diagnosis not present

## 2015-06-24 DIAGNOSIS — I4891 Unspecified atrial fibrillation: Secondary | ICD-10-CM | POA: Diagnosis not present

## 2015-06-24 DIAGNOSIS — D485 Neoplasm of uncertain behavior of skin: Secondary | ICD-10-CM | POA: Diagnosis not present

## 2015-06-24 LAB — POCT INR: INR: 1.7

## 2015-07-09 ENCOUNTER — Ambulatory Visit (INDEPENDENT_AMBULATORY_CARE_PROVIDER_SITE_OTHER): Payer: Medicare Other | Admitting: *Deleted

## 2015-07-09 DIAGNOSIS — I4891 Unspecified atrial fibrillation: Secondary | ICD-10-CM | POA: Diagnosis not present

## 2015-07-09 DIAGNOSIS — H2512 Age-related nuclear cataract, left eye: Secondary | ICD-10-CM | POA: Diagnosis not present

## 2015-07-09 DIAGNOSIS — I480 Paroxysmal atrial fibrillation: Secondary | ICD-10-CM

## 2015-07-09 DIAGNOSIS — H02834 Dermatochalasis of left upper eyelid: Secondary | ICD-10-CM | POA: Diagnosis not present

## 2015-07-09 DIAGNOSIS — H02831 Dermatochalasis of right upper eyelid: Secondary | ICD-10-CM | POA: Diagnosis not present

## 2015-07-09 DIAGNOSIS — H35373 Puckering of macula, bilateral: Secondary | ICD-10-CM | POA: Diagnosis not present

## 2015-07-09 DIAGNOSIS — Z7901 Long term (current) use of anticoagulants: Secondary | ICD-10-CM

## 2015-07-09 LAB — POCT INR: INR: 2.6

## 2015-07-28 DIAGNOSIS — Z888 Allergy status to other drugs, medicaments and biological substances status: Secondary | ICD-10-CM | POA: Diagnosis not present

## 2015-07-28 DIAGNOSIS — H02403 Unspecified ptosis of bilateral eyelids: Secondary | ICD-10-CM | POA: Diagnosis not present

## 2015-07-28 DIAGNOSIS — Z961 Presence of intraocular lens: Secondary | ICD-10-CM | POA: Diagnosis not present

## 2015-07-28 DIAGNOSIS — H01021 Squamous blepharitis right upper eyelid: Secondary | ICD-10-CM | POA: Diagnosis not present

## 2015-07-28 DIAGNOSIS — I10 Essential (primary) hypertension: Secondary | ICD-10-CM | POA: Diagnosis not present

## 2015-07-28 DIAGNOSIS — H2512 Age-related nuclear cataract, left eye: Secondary | ICD-10-CM | POA: Diagnosis not present

## 2015-07-28 DIAGNOSIS — H01006 Unspecified blepharitis left eye, unspecified eyelid: Secondary | ICD-10-CM | POA: Diagnosis not present

## 2015-07-28 DIAGNOSIS — I4891 Unspecified atrial fibrillation: Secondary | ICD-10-CM | POA: Diagnosis not present

## 2015-07-28 DIAGNOSIS — H02834 Dermatochalasis of left upper eyelid: Secondary | ICD-10-CM | POA: Diagnosis not present

## 2015-07-28 DIAGNOSIS — H01022 Squamous blepharitis right lower eyelid: Secondary | ICD-10-CM | POA: Diagnosis not present

## 2015-07-28 DIAGNOSIS — Z7901 Long term (current) use of anticoagulants: Secondary | ICD-10-CM | POA: Diagnosis not present

## 2015-07-28 DIAGNOSIS — Z9841 Cataract extraction status, right eye: Secondary | ICD-10-CM | POA: Diagnosis not present

## 2015-07-28 DIAGNOSIS — H01024 Squamous blepharitis left upper eyelid: Secondary | ICD-10-CM | POA: Diagnosis not present

## 2015-07-28 DIAGNOSIS — L908 Other atrophic disorders of skin: Secondary | ICD-10-CM | POA: Diagnosis not present

## 2015-07-28 DIAGNOSIS — H02831 Dermatochalasis of right upper eyelid: Secondary | ICD-10-CM | POA: Diagnosis not present

## 2015-07-28 DIAGNOSIS — H01025 Squamous blepharitis left lower eyelid: Secondary | ICD-10-CM | POA: Diagnosis not present

## 2015-07-28 DIAGNOSIS — H01003 Unspecified blepharitis right eye, unspecified eyelid: Secondary | ICD-10-CM | POA: Diagnosis not present

## 2015-08-05 ENCOUNTER — Ambulatory Visit (INDEPENDENT_AMBULATORY_CARE_PROVIDER_SITE_OTHER): Payer: Medicare Other | Admitting: *Deleted

## 2015-08-05 DIAGNOSIS — Z7901 Long term (current) use of anticoagulants: Secondary | ICD-10-CM

## 2015-08-05 DIAGNOSIS — I480 Paroxysmal atrial fibrillation: Secondary | ICD-10-CM

## 2015-08-05 DIAGNOSIS — I4891 Unspecified atrial fibrillation: Secondary | ICD-10-CM | POA: Diagnosis not present

## 2015-08-05 DIAGNOSIS — Z85828 Personal history of other malignant neoplasm of skin: Secondary | ICD-10-CM | POA: Diagnosis not present

## 2015-08-05 LAB — POCT INR: INR: 2.4

## 2015-08-18 ENCOUNTER — Encounter: Payer: Self-pay | Admitting: Family Medicine

## 2015-09-01 ENCOUNTER — Ambulatory Visit (INDEPENDENT_AMBULATORY_CARE_PROVIDER_SITE_OTHER): Payer: Medicare Other | Admitting: Internal Medicine

## 2015-09-01 ENCOUNTER — Encounter: Payer: Self-pay | Admitting: Internal Medicine

## 2015-09-01 ENCOUNTER — Ambulatory Visit (INDEPENDENT_AMBULATORY_CARE_PROVIDER_SITE_OTHER): Payer: Medicare Other

## 2015-09-01 VITALS — BP 162/88 | HR 60 | Ht 71.0 in | Wt 200.4 lb

## 2015-09-01 DIAGNOSIS — I480 Paroxysmal atrial fibrillation: Secondary | ICD-10-CM

## 2015-09-01 DIAGNOSIS — Z79899 Other long term (current) drug therapy: Secondary | ICD-10-CM | POA: Diagnosis not present

## 2015-09-01 DIAGNOSIS — I4891 Unspecified atrial fibrillation: Secondary | ICD-10-CM | POA: Diagnosis not present

## 2015-09-01 DIAGNOSIS — I2510 Atherosclerotic heart disease of native coronary artery without angina pectoris: Secondary | ICD-10-CM

## 2015-09-01 DIAGNOSIS — I350 Nonrheumatic aortic (valve) stenosis: Secondary | ICD-10-CM | POA: Diagnosis not present

## 2015-09-01 DIAGNOSIS — Z7901 Long term (current) use of anticoagulants: Secondary | ICD-10-CM | POA: Diagnosis not present

## 2015-09-01 LAB — POCT INR: INR: 2.9

## 2015-09-01 NOTE — Patient Instructions (Signed)
Medication Instructions: - Your physician recommends that you continue on your current medications as directed. Please refer to the Current Medication list given to you today.  Labwork: - Your physician recommends that you have lab work today: liver  Procedures/Testing: - none  Follow-Up: - Your physician wants you to follow-up in: 6 months with Tommye Standard, PA for Dr. Caryl Comes. You will receive a reminder letter in the mail two months in advance. If you don't receive a letter, please call our office to schedule the follow-up appointment.  Any Additional Special Instructions Will Be Listed Below (If Applicable).     If you need a refill on your cardiac medications before your next appointment, please call your pharmacy.

## 2015-09-01 NOTE — Progress Notes (Signed)
Patient Care Team: Tonia Ghent, MD as PCP - General (Family Medicine)   HPI  [name] [name] is a 81 y.o. male Seen in followup for atrial fibrillation and VT been treated with amiodarone. Thromboembolic risk factors are notable for a chads vas score of 3   He underwent monitoring after initial visit to clarify the presence of atrial fibrillation.This prompted reinitiation of his warfarin and his amiodarone.Intercurrently he has seen Dr. Rayann Heman to consider catheter ablation but decided that he would continue on amiodarone presently     He has a history of ischemic heart disease with prior bypass surgery 2004 Catheterization 2007 demonstrated patent grafts  He has had a problem with a cough. His ARB was changed from cozaaar >> benicar last year.   He has been on amiodarone for about 7 years.  He continues with his cough. It is associated with phlegm production   Pulmonary function testing has been done annually for thelast 3 years.DLCO's have been in sequence, 81, 87, and 77%.      Echocardiogram 2/16 demonstrated mild aortic stenosis with a mean gradient of 17 ; ejection fraction was 55-60%.  His dyspnea is very short lived and not assoc with exertion     Past Medical History  Diagnosis Date  . HTN (hypertension)   . Paroxysmal atrial fibrillation (HCC)   . Mild aortic stenosis   . Coronary artery disease CARDIOLOGIST-  DR KLEIN/ ALLRED    a. s/p CABG 2004, b. LHC with patent grafts 07/2005, ejection fraction 50%;  c. Myoview 2/15: no ischemia, EF 61%  . S/P CABG x 5 2004  . Aortic stenosis     a. Echo 2/15: Severe LVH, EF 55-60%, Gr 1 DD, mild to mod AS (mean 20 mmHg, peak 31 mmHg), AVA 1.4-1.5 cm2, mild AI, MAC, trivial MR, LA upper limits of normal, RVSP 22 mmHg, mild RAE;   b. Echo 2/16:  EF 55-60%, no RWMA, mild AS (mean 17 mmHg, peak 27 mmHg), AVA 1.5 cm2, mild AI, MAC, mild LAE  . 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 Amiodarone Rx  . Dyslipidemia   . Anticoagulated on Coumadin     [facility]  . GERD (gastroesophageal reflux disease)   . Chronic cough     NO CARDIAC OR PULMONARY RELATED  . Bladder neoplasm   . Arthritis   . Dry eyes   . PAF (paroxysmal atrial fibrillation) ([place])     CHADS2-VASc:  4    Past Surgical History  Procedure Laterality Date  . Electrophysiology study  07-27-2005  DR Cristopher [place]    MAPPING --   RESULT NONINDUCIBLE VT OR SVT/  DX RIGHT VENTRICULAR OUTFLOW TRACT VT AND RULES OUT MORE MALIGNANT CAUSES OF VT  . Lumbar disc surgery  1999    L4 -- L5  . Inguinal hernia repair Bilateral 1954  &  1990  . Knee arthroscopy Right 1994  . Coronary artery bypass graft  03-08-2003  DR [id number]    LIMA TO LAD/ SVG TO OM2/  SVG TO DIAGONAL / SVG TO PDA & PLA  . Removal vocal cord polyps  1978  . Transthoracic echocardiogram  08-08-2011    MILD LVH/  EF [id number]  GRADE I DIASTOLIC DYSFUNCTION/ MILD AV STENOSIS  . Cardiac catheterization  07-26-2005  DR GAMBLE    PRESERVED LVF/  EF 50%/ PATENT GRAFTS  . Cardiac catheterization  03-04-2003  DR GAMBLE    SEVERE 3 VESSEL DISEASE  . Cardiac catheterization  [place]    PAF/ FALSE POSITIVE STRESS TEST  . Cataract extraction w/ intraocular lens implant Right   . Tonsillectomy  AS CHILD  . Cystoscopy with biopsy N/A 12/25/2012    Procedure: CYSTOSCOPY WITH BLADDER BIOPSY;  Surgeon: Fredricka Bonine, MD;  [facility]: [facility];  Service: Urology;  Laterality: N/A;    Current Outpatient Prescriptions  Medication Sig Dispense Refill  . acetaminophen (TYLENOL) 325 MG tablet Take 650 mg by mouth once as needed for moderate pain or headache.    Marland Kitchen amiodarone (PACERONE) 200 MG tablet Take 100 mg by mouth every morning.     . butalbital-acetaminophen-caffeine (FIORICET, ESGIC) 50-325-40 MG per tablet Take 1 tablet by mouth once as needed for headache.     . cetirizine (ZYRTEC) 10 MG  tablet Take 10 mg by mouth daily.    . cholecalciferol (VITAMIN D) 1000 UNITS tablet Take 1,000 Units by mouth daily.    . hydrochlorothiazide (HYDRODIURIL) 25 MG tablet TAKE 1 TABLET (25 MG TOTAL) BY MOUTH EVERY MORNING. 90 tablet 0  . Polyethyl Glycol-Propyl Glycol (SYSTANE ULTRA OP) Apply 1-2 drops to eye once as needed (dry eyes.).     Marland Kitchen potassium chloride (K-DUR) 10 MEQ tablet Take 10 mEq by mouth every morning. Take as directed    . pravastatin (PRAVACHOL) 80 MG tablet Take 80 mg by mouth every evening.     . warfarin (COUMADIN) 5 MG tablet PATIENT TAKES 1.5 MG (1 1/2 TABLET) BY MOUTH MON-SAT. THEN 5 MG (1 TABLET) BY MOUTH ON Sunday.     No current facility-administered medications for this visit.    Allergies  Allergen Reactions  . Ace Inhibitors Other (See Comments)    COUGH  . Hycodan [Hydrocodone-Homatropine] Nausea And Vomiting  . Oxycodone Nausea Only    Pt states that he cannot take prescription pain medication  . Zocor [Simvastatin] Other (See Comments)    MYALGIA  . Cephalexin Rash    Review of Systems negative except from HPI and PMH  Physical Exam BP 162/88 mmHg  Pulse 60  Ht 5\' 11"  (1.803 m)  Wt 204 lb (92.534 kg)  BMI 28.46 kg/m2 Well developed and nourished in no acute distress HENT normal Neck supple with JVP-flat; carotids brisk  Clear Regular rate and rhythm, 2/6 systolic  Abd-soft with active BS No Clubbing cyanosis edema Skin-warm and dry A & Oriented  Grossly normal sensory and motor function  ECG was ordered today demonstrated sinus rhythm at 59 Intervals 19/12/48 RSR prime  Not appreciably different from 2015  Assessment and  Plan  Aortic stenosis  Mild  Atrial fibrillation  Hypertension  Ischemic heart disease  S/p CABG  With near normal LV function   mild  Aortic stenosis   Atrial fibrillation has been symptomatically infrequent.  He is maintained on warfarin  Without symptoms of ischemia  Tolerating amio  Will check LFTs   TSH ok a few months ago  Reviewed blood pressure records from at home mostly 130 range.

## 2015-09-02 LAB — HEPATIC FUNCTION PANEL
ALBUMIN: 4.3 g/dL (ref 3.6–5.1)
ALT: 10 U/L (ref 9–46)
AST: 21 U/L (ref 10–35)
Alkaline Phosphatase: 58 U/L (ref 40–115)
Bilirubin, Direct: 0.1 mg/dL (ref <=0.2)
Indirect Bilirubin: 0.5 mg/dL (ref 0.2–1.2)
TOTAL PROTEIN: 6.7 g/dL (ref 6.1–8.1)
Total Bilirubin: 0.6 mg/dL (ref 0.2–1.2)

## 2015-10-13 ENCOUNTER — Ambulatory Visit (INDEPENDENT_AMBULATORY_CARE_PROVIDER_SITE_OTHER): Payer: Medicare Other

## 2015-10-13 DIAGNOSIS — Z7901 Long term (current) use of anticoagulants: Secondary | ICD-10-CM | POA: Diagnosis not present

## 2015-10-13 DIAGNOSIS — I4891 Unspecified atrial fibrillation: Secondary | ICD-10-CM | POA: Diagnosis not present

## 2015-10-13 DIAGNOSIS — I480 Paroxysmal atrial fibrillation: Secondary | ICD-10-CM | POA: Diagnosis not present

## 2015-10-13 LAB — POCT INR: INR: 1.9

## 2015-11-04 DIAGNOSIS — Z85828 Personal history of other malignant neoplasm of skin: Secondary | ICD-10-CM | POA: Diagnosis not present

## 2015-11-04 DIAGNOSIS — C4441 Basal cell carcinoma of skin of scalp and neck: Secondary | ICD-10-CM | POA: Diagnosis not present

## 2015-11-18 ENCOUNTER — Ambulatory Visit (INDEPENDENT_AMBULATORY_CARE_PROVIDER_SITE_OTHER): Payer: Medicare Other | Admitting: *Deleted

## 2015-11-18 DIAGNOSIS — I480 Paroxysmal atrial fibrillation: Secondary | ICD-10-CM | POA: Diagnosis not present

## 2015-11-18 DIAGNOSIS — Z7901 Long term (current) use of anticoagulants: Secondary | ICD-10-CM

## 2015-11-18 DIAGNOSIS — I4891 Unspecified atrial fibrillation: Secondary | ICD-10-CM

## 2015-11-18 LAB — POCT INR: INR: 2.9

## 2015-11-25 ENCOUNTER — Other Ambulatory Visit: Payer: Self-pay | Admitting: *Deleted

## 2015-11-25 MED ORDER — HYDROCHLOROTHIAZIDE 25 MG PO TABS: ORAL_TABLET | ORAL | 2 refills | Status: DC

## 2015-11-25 MED ORDER — WARFARIN SODIUM 5 MG PO TABS: ORAL_TABLET | ORAL | 3 refills | Status: DC

## 2015-12-17 DIAGNOSIS — Z85828 Personal history of other malignant neoplasm of skin: Secondary | ICD-10-CM | POA: Diagnosis not present

## 2015-12-17 DIAGNOSIS — L570 Actinic keratosis: Secondary | ICD-10-CM | POA: Diagnosis not present

## 2015-12-30 ENCOUNTER — Ambulatory Visit (INDEPENDENT_AMBULATORY_CARE_PROVIDER_SITE_OTHER): Payer: Medicare Other | Admitting: *Deleted

## 2015-12-30 DIAGNOSIS — I480 Paroxysmal atrial fibrillation: Secondary | ICD-10-CM | POA: Diagnosis not present

## 2015-12-30 DIAGNOSIS — I4891 Unspecified atrial fibrillation: Secondary | ICD-10-CM | POA: Diagnosis not present

## 2015-12-30 DIAGNOSIS — Z7901 Long term (current) use of anticoagulants: Secondary | ICD-10-CM | POA: Diagnosis not present

## 2015-12-30 LAB — POCT INR: INR: 2.8

## 2016-01-19 ENCOUNTER — Ambulatory Visit (INDEPENDENT_AMBULATORY_CARE_PROVIDER_SITE_OTHER): Payer: Medicare Other

## 2016-01-19 ENCOUNTER — Ambulatory Visit: Payer: Medicare Other | Admitting: Family Medicine

## 2016-01-19 DIAGNOSIS — Z23 Encounter for immunization: Secondary | ICD-10-CM | POA: Diagnosis not present

## 2016-01-21 ENCOUNTER — Encounter: Payer: Self-pay | Admitting: Family Medicine

## 2016-01-21 LAB — HEMOGLOBIN
Creatinine, Ser: 0.96
Glucose: 93
HEMOGLOBIN: 14.5 g/dL
PLATELETS: 205
Potassium: 3.7 mmol/L

## 2016-02-10 ENCOUNTER — Ambulatory Visit (INDEPENDENT_AMBULATORY_CARE_PROVIDER_SITE_OTHER): Payer: Medicare Other | Admitting: *Deleted

## 2016-02-10 DIAGNOSIS — Z7901 Long term (current) use of anticoagulants: Secondary | ICD-10-CM | POA: Diagnosis not present

## 2016-02-10 DIAGNOSIS — I4891 Unspecified atrial fibrillation: Secondary | ICD-10-CM

## 2016-02-10 DIAGNOSIS — I480 Paroxysmal atrial fibrillation: Secondary | ICD-10-CM | POA: Diagnosis not present

## 2016-02-10 LAB — POCT INR: INR: 2.1

## 2016-02-11 ENCOUNTER — Encounter (HOSPITAL_COMMUNITY): Payer: Self-pay | Admitting: Emergency Medicine

## 2016-02-11 ENCOUNTER — Observation Stay (HOSPITAL_COMMUNITY)
Admission: EM | Admit: 2016-02-11 | Discharge: 2016-02-12 | Disposition: A | Discharge status: Home / Self Care | Payer: Medicare Other | Attending: Cardiology | Admitting: Cardiology

## 2016-02-11 ENCOUNTER — Emergency Department (HOSPITAL_COMMUNITY): Payer: Medicare Other

## 2016-02-11 ENCOUNTER — Telehealth: Payer: Self-pay | Admitting: Internal Medicine

## 2016-02-11 ENCOUNTER — Ambulatory Visit: Payer: Medicare Other | Admitting: Physician Assistant

## 2016-02-11 DIAGNOSIS — I4729 Other ventricular tachycardia: Secondary | ICD-10-CM

## 2016-02-11 DIAGNOSIS — J310 Chronic rhinitis: Secondary | ICD-10-CM | POA: Diagnosis not present

## 2016-02-11 DIAGNOSIS — I480 Paroxysmal atrial fibrillation: Secondary | ICD-10-CM | POA: Diagnosis not present

## 2016-02-11 DIAGNOSIS — Z87891 Personal history of nicotine dependence: Secondary | ICD-10-CM | POA: Insufficient documentation

## 2016-02-11 DIAGNOSIS — I350 Nonrheumatic aortic (valve) stenosis: Secondary | ICD-10-CM

## 2016-02-11 DIAGNOSIS — I2510 Atherosclerotic heart disease of native coronary artery without angina pectoris: Secondary | ICD-10-CM | POA: Diagnosis present

## 2016-02-11 DIAGNOSIS — R55 Syncope and collapse: Secondary | ICD-10-CM | POA: Diagnosis not present

## 2016-02-11 DIAGNOSIS — K219 Gastro-esophageal reflux disease without esophagitis: Secondary | ICD-10-CM | POA: Diagnosis present

## 2016-02-11 DIAGNOSIS — I472 Ventricular tachycardia: Secondary | ICD-10-CM | POA: Diagnosis not present

## 2016-02-11 DIAGNOSIS — R51 Headache: Secondary | ICD-10-CM | POA: Diagnosis not present

## 2016-02-11 DIAGNOSIS — I10 Essential (primary) hypertension: Secondary | ICD-10-CM | POA: Diagnosis present

## 2016-02-11 DIAGNOSIS — Z951 Presence of aortocoronary bypass graft: Secondary | ICD-10-CM | POA: Diagnosis not present

## 2016-02-11 DIAGNOSIS — E785 Hyperlipidemia, unspecified: Secondary | ICD-10-CM | POA: Diagnosis present

## 2016-02-11 DIAGNOSIS — Z79899 Other long term (current) drug therapy: Secondary | ICD-10-CM | POA: Diagnosis not present

## 2016-02-11 DIAGNOSIS — Z7901 Long term (current) use of anticoagulants: Secondary | ICD-10-CM | POA: Diagnosis not present

## 2016-02-11 LAB — BASIC METABOLIC PANEL
Anion gap: 9 (ref 5–15)
BUN: 16 mg/dL (ref 6–20)
CALCIUM: 9.4 mg/dL (ref 8.9–10.3)
CHLORIDE: 100 mmol/L — AB (ref 101–111)
CO2: 27 mmol/L (ref 22–32)
CREATININE: 0.97 mg/dL (ref 0.61–1.24)
GFR calc non Af Amer: >60 mL/min (ref >60)
GLUCOSE: 100 mg/dL — AB (ref 65–99)
Potassium: 3.8 mmol/L (ref 3.5–5.1)
Sodium: 136 mmol/L (ref 135–145)

## 2016-02-11 LAB — CBC
HCT: 43.4 % (ref 39.0–52.0)
Hemoglobin: 15 g/dL (ref 13.0–17.0)
MCH: 31.4 pg (ref 26.0–34.0)
MCHC: 34.6 g/dL (ref 30.0–36.0)
MCV: 91 fL (ref 78.0–100.0)
PLATELETS: 210 10*3/uL (ref 150–400)
RBC: 4.77 MIL/uL (ref 4.22–5.81)
RDW: 14.3 % (ref 11.5–15.5)
WBC: 7.2 10*3/uL (ref 4.0–10.5)

## 2016-02-11 LAB — URINE MICROSCOPIC-ADD ON
Bacteria, UA: NONE SEEN
WBC UA: NONE SEEN WBC/hpf (ref 0–5)

## 2016-02-11 LAB — I-STAT TROPONIN, ED: Troponin i, poc: 0 ng/mL (ref 0.00–0.08)

## 2016-02-11 LAB — URINALYSIS, ROUTINE W REFLEX MICROSCOPIC
BILIRUBIN URINE: NEGATIVE
GLUCOSE, UA: NEGATIVE mg/dL
Ketones, ur: NEGATIVE mg/dL
Leukocytes, UA: NEGATIVE
Nitrite: NEGATIVE
PROTEIN: NEGATIVE mg/dL
SPECIFIC GRAVITY, URINE: 1.005 (ref 1.005–1.030)
pH: 7.5 (ref 5.0–8.0)

## 2016-02-11 LAB — HEPATIC FUNCTION PANEL
ALT: 17 U/L (ref 17–63)
AST: 29 U/L (ref 15–41)
Albumin: 4.4 g/dL (ref 3.5–5.0)
Alkaline Phosphatase: 59 U/L (ref 38–126)
BILIRUBIN DIRECT: 0.2 mg/dL (ref 0.1–0.5)
BILIRUBIN INDIRECT: 0.2 mg/dL — AB (ref 0.3–0.9)
TOTAL PROTEIN: 7.5 g/dL (ref 6.5–8.1)
Total Bilirubin: 0.4 mg/dL (ref 0.3–1.2)

## 2016-02-11 LAB — PROTIME-INR
INR: 1.72
PROTHROMBIN TIME: 20.4 s — AB (ref 11.4–15.2)

## 2016-02-11 MED ORDER — BUTALBITAL-APAP-CAFFEINE 50-325-40 MG PO TABS
1.0000 | ORAL_TABLET | Freq: Once | ORAL | Status: AC
  Administered 2016-02-11: 1 via ORAL
  Filled 2016-02-11: qty 1

## 2016-02-11 NOTE — H&P (Signed)
Admit date: 02/11/2016 Primary Physician  Elsie Stain, MD Primary Cardiologist  Caryl Comes  CC: Syncope  HPI: 81 year old male with a history atrial fibrillation, CABG, mild aortic stenosis, who earlier this evening had a singular episode of syncope after going to the bathroom at 3 AM this morning. He woke up on his knees in front of the toilet. He urinated while he was on the floor.  He does not remember feeling any prodrome. No chest pain. He states that he has had approximately 3 syncopal like episodes one of which occurred when he was bending over gardening and slightly dehydrated and nearly fainted, another one occurred after getting up from the bathroom in the middle of the night and walking back towards the bedroom. This one however he does not remember hitting the ground and he does not know how long he was "out". He has been taking amiodarone as well as Coumadin for quite some time. Is a history of right ventricular outflow tract ventricular tachycardia.  He called our office and after a discussion was encouraged to go to the emergency room. He has been here for a few hours and is somewhat disgruntled.  Cardiac catheterization 2007 demonstrated patent grafts. Amiodarone has been present for approximally 7 years. Cough. Pulmonary function studies show DLCO of 77%.  Currently here with his wife.   PMH:   Past Medical History:  Diagnosis Date  . Anticoagulated on Coumadin    followed in CVRR at [facility]  . Aortic stenosis    a. Echo 2/15: Severe LVH, EF 55-60%, Gr 1 DD, mild to mod AS (mean 20 mmHg, peak 31 mmHg), AVA 1.4-1.5 cm2, mild AI, MAC, trivial MR, LA upper limits of normal, RVSP 22 mmHg, mild RAE;   b. Echo 2/16:  EF 55-60%, no RWMA, mild AS (mean 17 mmHg, peak 27 mmHg), AVA 1.5 cm2, mild AI, MAC, mild LAE  . Arthritis   . Bladder neoplasm   . Chronic cough    NO CARDIAC OR PULMONARY RELATED  . Coronary artery disease CARDIOLOGIST-  DR KLEIN/ ALLRED   a. s/p CABG  2004, b. LHC with patent grafts 07/2005, ejection fraction 50%;  c. Myoview 2/15: no ischemia, EF 61%  . Dry eyes   . Dyslipidemia   . GERD (gastroesophageal reflux disease)   . HTN (hypertension)   . Mild aortic stenosis   . PAF (paroxysmal atrial fibrillation) ([place])    CHADS2-VASc:  4  . Paroxysmal atrial fibrillation (HCC)   . 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 Amiodarone Rx  . S/P CABG x 5 2004    PSH:   Past Surgical History:  Procedure Laterality Date  . CARDIAC CATHETERIZATION  07-26-2005  DR GAMBLE   PRESERVED LVF/  EF 50%/ PATENT GRAFTS  . CARDIAC CATHETERIZATION  03-04-2003  DR GAMBLE   SEVERE 3 VESSEL DISEASE  . [place]   PAF/ FALSE POSITIVE STRESS TEST  . CATARACT EXTRACTION W/ INTRAOCULAR LENS IMPLANT Right   . CORONARY ARTERY BYPASS GRAFT  03-08-2003  DR [id number]   LIMA TO LAD/ SVG TO OM2/  SVG TO DIAGONAL / SVG TO PDA & PLA  . CYSTOSCOPY WITH BIOPSY N/A 12/25/2012   Procedure: CYSTOSCOPY WITH BLADDER BIOPSY;  Surgeon: Fredricka Bonine, MD;  Location: [facility];  Service: Urology;  Laterality: N/A;  . ELECTROPHYSIOLOGY STUDY  07-27-2005  DR [place] --  RESULT NONINDUCIBLE VT OR SVT/  DX RIGHT VENTRICULAR OUTFLOW TRACT VT AND RULES OUT MORE MALIGNANT CAUSES OF VT  . INGUINAL HERNIA REPAIR Bilateral [place]  . KNEE ARTHROSCOPY Right 1994  . LUMBAR DISC SURGERY  1999   L4 -- L5  . REMOVAL VOCAL CORD POLYPS  1978  . TONSILLECTOMY  AS CHILD  . TRANSTHORACIC ECHOCARDIOGRAM  08-08-2011   MILD LVH/  EF [id number]  GRADE I DIASTOLIC DYSFUNCTION/ MILD AV STENOSIS   Allergies:  Ace inhibitors; Hycodan [hydrocodone-homatropine]; Oxycodone; Zocor [simvastatin]; and Cephalexin Prior to Admit Meds:   Prior to Admission medications   Medication Sig Start Date End Date Taking? Authorizing Provider  acetaminophen (TYLENOL) 325 MG tablet Take 650 mg by mouth once  as needed for moderate pain or headache.   Yes Historical Provider, MD  amiodarone (PACERONE) 200 MG tablet Take 100 mg by mouth every morning.  03/14/12  Yes Deboraha Sprang, MD  butalbital-acetaminophen-caffeine (FIORICET, ESGIC) [phone] MG per tablet Take 1 tablet by mouth once as needed for headache.    Yes Historical Provider, MD  cetirizine (ZYRTEC) 10 MG tablet Take 10 mg by mouth daily.   Yes Historical Provider, MD  cholecalciferol (VITAMIN D) 1000 UNITS tablet Take 1,000 Units by mouth daily.   Yes Historical Provider, MD  hydrochlorothiazide (HYDRODIURIL) 25 MG tablet TAKE 1 TABLET (25 MG TOTAL) BY MOUTH EVERY MORNING. 11/25/15  Yes Deboraha Sprang, MD  Polyethyl Glycol-Propyl Glycol (SYSTANE ULTRA OP) Apply 1-2 drops to eye once as needed (dry eyes.).    Yes Historical Provider, MD  potassium chloride (K-DUR) 10 MEQ tablet Take 10 mEq by mouth every morning. Take as directed 05/24/12  Yes Deboraha Sprang, MD  pravastatin (PRAVACHOL) 80 MG tablet Take 80 mg by mouth every evening.    Yes Historical Provider, MD  warfarin (COUMADIN) 5 MG tablet PATIENT TAKES 1.5 MG (1 1/2 TABLET) BY MOUTH DAILY Patient taking differently: Take 7.5 mg by mouth daily. PATIENT TAKES 7.5 MG (1 1/2 TABLET) BY MOUTH DAILY. 11/25/15  Yes Deboraha Sprang, MD  warfarin (COUMADIN) 5 MG tablet Take 7.5 mg by mouth daily. Patient takes 7.5 mg every day. ( 1 and 1/2 tablet )   Yes Historical Provider, MD   Fam HX:    Family History  Problem Relation Age of Onset  . Cancer Mother    Social HX:    Social History   Social History  . Marital status: Married    Spouse name: N/A  . Number of children: 3  . Years of education: N/A   Occupational History  . Retired    Social History Main Topics  . Smoking status: Former Smoker    Packs/day: 1.00    Years: 25.00    Types: Cigarettes    Quit date: 04/25/1980  . Smokeless tobacco: Never Used  . Alcohol use No  . Drug use: No  . Sexual activity: Not on file   Other  Topics Concern  . Not on file   Social History Narrative   [employer]-'58, overseas to [place]   Some care through [place]   No service disability     ROS:  All 11 ROS were addressed and are negative except what is stated in the HPI   Physical Exam: Blood pressure 138/82, pulse 93, temperature 98.9 F (37.2 C), temperature source Oral, resp. rate 14, height 5\' 10"  (1.778 m), weight 200 lb (90.7 kg), SpO2 97 %.  General: Well developed, well nourished, in no acute distress Head: Eyes PERRLA, No xanthomas.   Normal cephalic and atramatic  Lungs:  Clear bilaterally to auscultation and percussion. Normal respiratory effort. No wheezes, no rales. Heart:  Irregularly irregular, normal rate S1 S2 Pulses are 2+ & equal. 2/6 systolic right upper sternal border murmur, no rubs or gallops.             No carotid bruit. No JVD.  No abdominal bruits. Abdomen: Bowel sounds are positive, abdomen soft and non-tender without masses or                 Hernia's noted. No hepatosplenomegaly. Msk:  Back normal, normal gait. Normal strength and tone for age. Extremities:  No clubbing, cyanosis or edema.  DP +1 Neuro: Alert and oriented X 3, non-focal, MAE x 4 GU: Deferred Rectal: Deferred Psych:  Good affect, responds appropriately         Labs:   Lab Results  Component Value Date   WBC 7.2 02/11/2016   HGB 15.0 02/11/2016   HCT 43.4 02/11/2016   MCV 91.0 02/11/2016   PLT 210 02/11/2016    Recent Labs Lab 02/11/16 1306 02/11/16 1717  NA 136  --   K 3.8  --   CL 100*  --   CO2 27  --   BUN 16  --   CREATININE 0.97  --   CALCIUM 9.4  --   PROT  --  7.5  BILITOT  --  0.4  ALKPHOS  --  59  ALT  --  17  AST  --  29  GLUCOSE 100*  --    No results for input(s): CKTOTAL, CKMB, TROPONINI in the last 72 hours. Lab Results  Component Value Date   CHOL 166 12/18/2014   HDL 65 12/18/2014   LDLCALC 86 12/18/2014   TRIG 75 12/18/2014   No results found for: DDIMER   Radiology:  Ct Head  Wo Contrast  Result Date: 02/11/2016 CLINICAL DATA:  Loss of consciousness this morning. Right frontal headaches. EXAM: CT HEAD WITHOUT CONTRAST TECHNIQUE: Contiguous axial images were obtained from the base of the skull through the vertex without intravenous contrast. COMPARISON:  None. FINDINGS: Brain: There is no evidence for acute hemorrhage, hydrocephalus, mass lesion, or abnormal extra-axial fluid collection. No definite CT evidence for acute infarction. Diffuse loss of parenchymal volume is consistent with atrophy. Patchy low attenuation in the deep hemispheric and periventricular white matter is nonspecific, but likely reflects chronic microvascular ischemic demyelination. Vascular: Atherosclerotic calcification is visualized in the carotid arteries. No dense MCA sign. Major dural sinuses are unremarkable. Skull: No evidence for fracture. No worrisome lytic or sclerotic lesion. Sinuses/Orbits: The visualized paranasal sinuses and mastoid air cells are clear. Visualized portions of the globes and intraorbital fat are unremarkable. Other: None. IMPRESSION: 1. No acute intracranial abnormality. 2. Atrophy with chronic small vessel white matter ischemic disease. Electronically Signed   By: Misty Stanley M.D.   On: 02/11/2016 18:02   Dg Chest Portable 1 View  Result Date: 02/11/2016 CLINICAL DATA:  81 year old male with syncope, awoke on the floor. Initial encounter. EXAM: PORTABLE CHEST 1 VIEW COMPARISON:  Chest radiographs 07/09/2012. FINDINGS: Portable AP upright view at 1648 hours. Stable cardiac size and mediastinal contours. Calcified aortic atherosclerosis. Stable sequelae of CABG. Lung volumes are within normal limits. Allowing for portable technique the lungs are clear. No pneumothorax or pleural effusion. Visualized tracheal air column is within normal limits. IMPRESSION:  No acute cardiopulmonary abnormality. Calcified aortic atherosclerosis. Electronically Signed   By: Genevie Ann M.D.   On:  02/11/2016 17:04   Personally viewed.   EKG:  Atrial fibrillation with right bundle branch block, occasional narrative complex QRS. Personally viewed.  ECHO: 06/13/14  - Left ventricle: The cavity size was normal. Wall thickness was normal. Systolic function was normal. The estimated ejection fraction was in the range of 55% to 60%. Wall motion was normal; there were no regional wall motion abnormalities. Left ventricular diastolic function parameters were normal. - Aortic valve: There was mild stenosis. There was mild regurgitation. - Mitral valve: Calcified annulus. - Left atrium: The atrium was mildly dilated.  ASSESSMENT/PLAN:   81 year old male with atrial fibrillation and right ventricular outflow track ventricular tachycardia on amiodarone therapy who experienced syncope in the setting of micturation.  Syncope  - May have been induced during micturation, vagal phenomenon.  - However, he is on amiodarone, did not have any prodrome. May have had significant pause? Since he is in atrial fibrillation currently, could he have had a postconversion pause?  - We will observe in telemetry, observe overnight.  - Update echocardiogram if possible. Last one was performed on 06/13/14 which showed normal ejection fraction, mild aortic stenosis and mild aortic regurgitation.  - CT of head was unremarkable.  - Electrolytes unremarkable.  - EP service will see in the morning.  - Possible discharge if safe with event monitor.  Paroxysmal atrial fibrillation  - Prior EKG on 09/01/15 demonstrated sinus rhythm  - He is currently in atrial fibrillation heart rate in the 70s to 90s  Mild aortic stenosis  - Should be of no clinical significance  RVOT ventricular tachycardia  - Currently on amiodarone  - Has had consultation with Dr. Rayann Heman regarding possible ablation, continue with medical therapy.  - Could he have had a run of VT causing syncope?  I explained to him given his high  risk medication, amiodarone and syncope, that it would be advisable to at least observe him overnight on telemetry. He was not exceptionally thrilled about this idea but understands.   Candee Furbish, MD  02/11/2016  7:00 PM

## 2016-02-11 NOTE — ED Provider Notes (Signed)
By signing my name below, I, Dolores Hoose, attest that this documentation has been prepared under the direction and in the presence of Ezequiel Essex, MD . Electronically Signed: Dolores Hoose, Scribe. 02/11/2016. 4:44 PM.  HPI Comments:  [name] [name] is a 81 y.o. male with PMHx of AFib who presents to the Emergency Department complaining of sudden-onset singular episode of syncope beginning last night. Pt states that he went ot the bathroom last night when he passed out and woke up on his knees in front of the toilet. He notes that he had urinated again while he was on the floor. Pt states that after he returned to bed, he began experiencing some heart palpitations that he thinks may be related to his Afib. Pt denies feeling light-headed or dizzy prior to his syncopal episode or currently.  He also denies any SOB or Cp. Pt is compliant with amioderone for his afib and coumadin as a blood thinner.   Physical Exam  Constitutional: He is oriented to person, place, and time. He appears well-developed and well-nourished. No distress.  Well appearing  HENT:  Head: Normocephalic and atraumatic.  Mouth/Throat: Oropharynx is clear and moist. No oropharyngeal exudate.  Eyes: Conjunctivae and EOM are normal. Pupils are equal, round, and reactive to light.  Neck: Normal range of motion. Neck supple.  No meningismus.  Cardiovascular: Intact distal pulses.   Murmur heard. Irregular rate and rhythm. Systolic murmur.   Pulmonary/Chest: Effort normal and breath sounds normal. No respiratory distress.  Abdominal: Soft. There is no tenderness. There is no rebound and no guarding.  Musculoskeletal: Normal range of motion. He exhibits no edema or tenderness.  Neurological: He is alert and oriented to person, place, and time. No cranial nerve deficit. He exhibits normal muscle tone. Coordination normal.   5/5 strength throughout. CN 2-12 intact.Equal grip strength.   Skin: Skin is warm.  Psychiatric: He  has a normal mood and affect. His behavior is normal.  Nursing note and vitals reviewed.   4:51 PM Discussed treatment plan with pt at bedside which included chest x-ray and head imaging and pt agreed to plan.    EKG with RBBB and a fib.   Plan admission for syncope without prodrome.  I personally performed the services described in this documentation, which was scribed in my presence. The recorded information has been reviewed and is accurate.     Ezequiel Essex, MD 02/12/16 [phone]

## 2016-02-11 NOTE — ED Provider Notes (Signed)
[facility] DEPT Provider Note   CSN: [id number] Arrival date & time: 02/11/16  1243     History   Chief Complaint Chief Complaint  Patient presents with  . Loss of Consciousness    HPI   Blood pressure 122/79, pulse 94, temperature 98.9 F (37.2 C), temperature source Oral, resp. rate 15, height 5\' 10"  (1.778 m), weight 90.7 kg, SpO2 96 %.  [name] [name] is a 81 y.o. male with past medical history significant for paroxysmal A. fib, anticoagulated with Coumadin (last INR was 2.1 yesterday), aortic stenosis, CABG complaining of syncopal event this morning at 3 AM when he woke up to use the restroom. He was standing up, urinating and feeling completely normal, when he woke up he was on his knees and was hugging the toilet. States he did not hit his head, he denies headache, chest pain. He states that when he went back to bed he checked his pulse and it was elevated. He states he's been in his normal state of health and denies any melena, hematochezia, abdominal pain. States she's been eating and drinking normally, he last used the riding lawnmower for 2 hours yesterday and didn't drink while riding the lawnmower but push fluids afterwards. Patient with no complaints at this time.  Past Medical History:  Diagnosis Date  . Anticoagulated on Coumadin    followed in [facility] at [facility]  . Aortic stenosis    a. Echo 2/15: Severe LVH, EF 55-60%, Gr 1 DD, mild to mod AS (mean 20 mmHg, peak 31 mmHg), AVA 1.4-1.5 cm2, mild AI, MAC, trivial MR, LA upper limits of normal, RVSP 22 mmHg, mild RAE;   b. Echo 2/16:  EF 55-60%, no RWMA, mild AS (mean 17 mmHg, peak 27 mmHg), AVA 1.5 cm2, mild AI, MAC, mild LAE  . Arthritis   . Bladder neoplasm   . Chronic cough    NO CARDIAC OR PULMONARY RELATED  . Coronary artery disease CARDIOLOGIST-  DR KLEIN/ ALLRED   a. s/p CABG 2004, b. LHC with patent grafts 07/2005, ejection fraction 50%;  c. Myoview 2/15: no ischemia, EF 61%  . Dry eyes   .  Dyslipidemia   . GERD (gastroesophageal reflux disease)   . HTN (hypertension)   . Mild aortic stenosis   . PAF (paroxysmal atrial fibrillation) ([place])    CHADS2-VASc:  4  . Paroxysmal atrial fibrillation (HCC)   . 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 Amiodarone Rx  . S/P CABG x 5 2004    Patient Active Problem List   Diagnosis Date Noted  . Syncope 02/11/2016  . High risk medication use   . Skin lesion 06/04/2015  . Loss of weight 06/04/2015  . Lower back pain 05/01/2015  . Elevated TSH 12/26/2014  . Back pain 01/22/2014  . Aortic stenosis 05/16/2013  . Bladder neoplasm 11/23/2012  . BCC (basal cell carcinoma of skin) 08/29/2012  . Vertigo 08/24/2012  . GERD (gastroesophageal reflux disease) 05/01/2012  . Hyperlipidemia 03/14/2012  . TMJ pain dysfunction syndrome 01/20/2012  . Toe laceration 12/06/2011  . CAD (coronary artery disease) 11/30/2011  . Long term (current) use of anticoagulants 08/31/2011  . RVOT ventricular tachycardia ([place])   . Cough 04/29/2009  . HEARING LOSS, SUDDEN 12/03/2007  . RHINITIS 12/03/2007  . Essential hypertension 02/22/2007  . PAF (paroxysmal atrial fibrillation) ([place]) 02/22/2007  . SYMPTOM, HEADACHE 02/22/2007    Past Surgical History:  Procedure Laterality Date  . CARDIAC CATHETERIZATION  07-26-2005  DR GAMBLE   PRESERVED LVF/  EF 50%/ PATENT GRAFTS  . CARDIAC CATHETERIZATION  03-04-2003  DR GAMBLE   SEVERE 3 VESSEL DISEASE  . [place]   PAF/ FALSE POSITIVE STRESS TEST  . CATARACT EXTRACTION W/ INTRAOCULAR LENS IMPLANT Right   . CORONARY ARTERY BYPASS GRAFT  03-08-2003  DR [id number]   LIMA TO LAD/ SVG TO OM2/  SVG TO DIAGONAL / SVG TO PDA & PLA  . CYSTOSCOPY WITH BIOPSY N/A 12/25/2012   Procedure: CYSTOSCOPY WITH BLADDER BIOPSY;  Surgeon: Fredricka Bonine, MD;  Location: [facility];  Service: Urology;  Laterality: N/A;  . ELECTROPHYSIOLOGY  STUDY  07-27-2005  DR Carleene Overlie TAYLOR   MAPPING --   RESULT NONINDUCIBLE VT OR SVT/  DX RIGHT VENTRICULAR OUTFLOW TRACT VT AND RULES OUT MORE MALIGNANT CAUSES OF VT  . INGUINAL HERNIA REPAIR Bilateral [place]  . KNEE ARTHROSCOPY Right 1994  . LUMBAR DISC SURGERY  1999   L4 -- L5  . REMOVAL VOCAL CORD POLYPS  1978  . TONSILLECTOMY  AS CHILD  . TRANSTHORACIC ECHOCARDIOGRAM  08-08-2011   MILD LVH/  EF [id number]  GRADE I DIASTOLIC DYSFUNCTION/ MILD AV STENOSIS       Home Medications    Prior to Admission medications   Medication Sig Start Date End Date Taking? Authorizing Provider  acetaminophen (TYLENOL) 325 MG tablet Take 650 mg by mouth once as needed for moderate pain or headache.   Yes Historical Provider, MD  amiodarone (PACERONE) 200 MG tablet Take 100 mg by mouth every morning.  03/14/12  Yes Deboraha Sprang, MD  butalbital-acetaminophen-caffeine (FIORICET, ESGIC) [phone] MG per tablet Take 1 tablet by mouth once as needed for headache.    Yes Historical Provider, MD  cetirizine (ZYRTEC) 10 MG tablet Take 10 mg by mouth daily.   Yes Historical Provider, MD  cholecalciferol (VITAMIN D) 1000 UNITS tablet Take 1,000 Units by mouth daily.   Yes Historical Provider, MD  hydrochlorothiazide (HYDRODIURIL) 25 MG tablet TAKE 1 TABLET (25 MG TOTAL) BY MOUTH EVERY MORNING. 11/25/15  Yes Deboraha Sprang, MD  Polyethyl Glycol-Propyl Glycol (SYSTANE ULTRA OP) Apply 1-2 drops to eye once as needed (dry eyes.).    Yes Historical Provider, MD  potassium chloride (K-DUR) 10 MEQ tablet Take 10 mEq by mouth every morning. Take as directed 05/24/12  Yes Deboraha Sprang, MD  pravastatin (PRAVACHOL) 80 MG tablet Take 80 mg by mouth every evening.    Yes Historical Provider, MD  warfarin (COUMADIN) 5 MG tablet PATIENT TAKES 1.5 MG (1 1/2 TABLET) BY MOUTH DAILY Patient taking differently: Take 7.5 mg by mouth daily. PATIENT TAKES 7.5 MG (1 1/2 TABLET) BY MOUTH DAILY. 11/25/15  Yes Deboraha Sprang, MD  warfarin  (COUMADIN) 5 MG tablet Take 7.5 mg by mouth daily. Patient takes 7.5 mg every day. ( 1 and 1/2 tablet )   Yes Historical Provider, MD    Family History Family History  Problem Relation Age of Onset  . Cancer Mother     Social History Social History  Substance Use Topics  . Smoking status: Former Smoker    Packs/day: 1.00    Years: 25.00    Types: Cigarettes    Quit date: 04/25/1980  . Smokeless tobacco: Never Used  . Alcohol use No     Allergies   Ace inhibitors; Hycodan [hydrocodone-homatropine]; Oxycodone; Zocor [simvastatin]; and Cephalexin   Review  of Systems Review of Systems  10 systems reviewed and found to be negative, except as noted in the HPI.   Physical Exam Updated Vital Signs BP 142/94   Pulse 85   Temp 98.9 F (37.2 C) (Oral)   Resp 16   Ht 5\' 10"  (1.778 m)   Wt 90.7 kg   SpO2 93%   BMI 28.70 kg/m   Physical Exam  Constitutional: He is oriented to person, place, and time. He appears well-developed and well-nourished. No distress.  HENT:  Head: Normocephalic and atraumatic.  Mouth/Throat: Oropharynx is clear and moist.  Eyes: Conjunctivae and EOM are normal. Pupils are equal, round, and reactive to light.  Neck: Normal range of motion.  Cardiovascular: Normal rate, regular rhythm and intact distal pulses.   Pulmonary/Chest: Effort normal and breath sounds normal.  Abdominal: Soft. There is no tenderness.  Musculoskeletal: Normal range of motion.  Neurological: He is alert and oriented to person, place, and time.  Skin: He is not diaphoretic.  Psychiatric: He has a normal mood and affect.  Nursing note and vitals reviewed.    ED Treatments / Results  Labs (all labs ordered are listed, but only abnormal results are displayed) Labs Reviewed  BASIC METABOLIC PANEL - Abnormal; Notable for the following:       Result Value   Chloride 100 (*)    Glucose, Bld 100 (*)    All other components within normal limits  URINALYSIS, ROUTINE W REFLEX  MICROSCOPIC (NOT AT [facility]) - Abnormal; Notable for the following:    Hgb urine dipstick TRACE (*)    All other components within normal limits  URINE MICROSCOPIC-ADD ON - Abnormal; Notable for the following:    Squamous Epithelial / LPF 0-5 (*)    All other components within normal limits  PROTIME-INR - Abnormal; Notable for the following:    Prothrombin Time 20.4 (*)    All other components within normal limits  HEPATIC FUNCTION PANEL - Abnormal; Notable for the following:    Indirect Bilirubin 0.2 (*)    All other components within normal limits  CBC  CBG MONITORING, ED  I-STAT TROPOININ, ED    EKG  EKG Interpretation  Date/Time:  Thursday February 11 2016 18:17:41 EDT Ventricular Rate:  100 PR Interval:    QRS Duration: 164 QT Interval:  428 QTC Calculation: 536 R Axis:   10 Text Interpretation:  Atrial fibrillation Paired ventricular premature complexes Right bundle branch block No significant change was found Confirmed by Wyvonnia Dusky  MD, STEPHEN [phone]) on 02/11/2016 6:34:53 PM       Radiology Ct Head Wo Contrast  Result Date: 02/11/2016 CLINICAL DATA:  Loss of consciousness this morning. Right frontal headaches. EXAM: CT HEAD WITHOUT CONTRAST TECHNIQUE: Contiguous axial images were obtained from the base of the skull through the vertex without intravenous contrast. COMPARISON:  None. FINDINGS: Brain: There is no evidence for acute hemorrhage, hydrocephalus, mass lesion, or abnormal extra-axial fluid collection. No definite CT evidence for acute infarction. Diffuse loss of parenchymal volume is consistent with atrophy. Patchy low attenuation in the deep hemispheric and periventricular white matter is nonspecific, but likely reflects chronic microvascular ischemic demyelination. Vascular: Atherosclerotic calcification is visualized in the carotid arteries. No dense MCA sign. Major dural sinuses are unremarkable. Skull: No evidence for fracture. No worrisome lytic or sclerotic  lesion. Sinuses/Orbits: The visualized paranasal sinuses and mastoid air cells are clear. Visualized portions of the globes and intraorbital fat are unremarkable. Other: None. IMPRESSION: 1. No acute  intracranial abnormality. 2. Atrophy with chronic small vessel white matter ischemic disease. Electronically Signed   By: Misty Stanley M.D.   On: 02/11/2016 18:02   Dg Chest Portable 1 View  Result Date: 02/11/2016 CLINICAL DATA:  81 year old male with syncope, awoke on the floor. Initial encounter. EXAM: PORTABLE CHEST 1 VIEW COMPARISON:  Chest radiographs 07/09/2012. FINDINGS: Portable AP upright view at 1648 hours. Stable cardiac size and mediastinal contours. Calcified aortic atherosclerosis. Stable sequelae of CABG. Lung volumes are within normal limits. Allowing for portable technique the lungs are clear. No pneumothorax or pleural effusion. Visualized tracheal air column is within normal limits. IMPRESSION: No acute cardiopulmonary abnormality. Calcified aortic atherosclerosis. Electronically Signed   By: Genevie Ann M.D.   On: 02/11/2016 17:04    Procedures Procedures (including critical care time)  Medications Ordered in ED Medications  butalbital-acetaminophen-caffeine (FIORICET, ESGIC) 50-325-40 MG per tablet 1 tablet (not administered)     Initial Impression / Assessment and Plan / ED Course  I have reviewed the triage vital signs and the nursing notes.  Pertinent labs & imaging results that were available during my care of the patient were reviewed by me and considered in my medical decision making (see chart for details).  Clinical Course   Vitals:   02/11/16 1858 02/11/16 1900 02/11/16 2000 02/11/16 2030  BP: 138/82 122/86 120/97 142/94  Pulse: 93 94 94 85  Resp: 14 16 16 16   Temp:      TempSrc:      SpO2: 97% 95% 97% 93%  Weight:      Height:        Medications  butalbital-acetaminophen-caffeine (FIORICET, ESGIC) 50-325-40 MG per tablet 1 tablet (not administered)     [name] [name] is 81 y.o. male presenting with Syncope, no prodrome occurring at 3 AM while he was urinating. He is anticoagulated but he states he didn't hit his head, he states that he came to consciousness and he was on his knees, head was around the toilet. Neurologic exam nonfocal. Given his history of A. fib and aortic stenosis in the syncope was no prodrome I think is reasonable to admit this patient for observation, discussed with cardiologist NP Ria Comment. EKG with A. fib, troponin negative, blood work reassuring.  Admitted to cardiology for observation.  This is a shared visit with the attending physician who personally evaluated the patient and agrees with the care plan.     Final Clinical Impressions(s) / ED Diagnoses   Final diagnoses:  Syncope, unspecified syncope type    New Prescriptions New Prescriptions   No medications on file     Monico Blitz, PA-C 02/11/16 2148    Ezequiel Essex, MD 02/12/16 [phone]

## 2016-02-11 NOTE — ED Triage Notes (Signed)
Pt states at 3am this morning he went to bathroom, passed out, woke up laying near toilet. Pt states he woke up and made his way back to the bed. Pt states he called Dr. Aquilla Hacker office and was told to come to ED. Pt does not recall passing out. Pt denies CP. Pt has hx of Afib.

## 2016-02-11 NOTE — Telephone Encounter (Signed)
New Message:    Pt got up around 3:00 this morning and fainted.Around 7:00 this morning his heart rate it was 84,which is high for him.Please call asap,very weak and very concerned.Pt thinks he might need to be seen.

## 2016-02-11 NOTE — Telephone Encounter (Signed)
Per Melina Copa, PA-C: Call patient back and send straight to ED for 24HR obs tele. I did so.  I paged Trish [phone]) for call back to give heads up on patient arrival.

## 2016-02-11 NOTE — ED Notes (Signed)
PT requests a fioricet for headache.

## 2016-02-11 NOTE — ED Notes (Signed)
Delay in lab draw, exray in room 

## 2016-02-11 NOTE — Progress Notes (Signed)
Called ED for report  

## 2016-02-11 NOTE — Telephone Encounter (Signed)
I spoke to patient.  He states around 0300 today he got out of bed to urinate. He states at some point during urination he "passed out". He states he is unaware of how long he was "out".  He denies injury/pain from fall. He states this has happened 3x in the past 5-6 years. He c/o feeling weak and nervous at this point. He denies SOB, nausea/vomiting, and chest pain at this time. He reports BP/HR @ 0730-134/82  84.  I had him check it again while on call, he reports 132/90 HR 80. He states his HR is normally in the mid-low 60s, reports BP WNL for him.  He states after episodes of elevated HR in the past, MD advised to increase amiodarone. He states he took 150mg  at 0730. I put patient on Dayna Dunn, PA-Cs schedule for today @ 1330 and advised him to have someone drive him to appt. He voiced understanding and agreed with plan.

## 2016-02-11 NOTE — ED Notes (Signed)
Santiago Glad, RN accepts report at this time.

## 2016-02-11 NOTE — Telephone Encounter (Signed)
I spoke to Trish: I advised of situation and recommendation. She voiced understanding.

## 2016-02-12 ENCOUNTER — Telehealth: Payer: Self-pay | Admitting: Internal Medicine

## 2016-02-12 ENCOUNTER — Other Ambulatory Visit: Payer: Self-pay | Admitting: Physician Assistant

## 2016-02-12 ENCOUNTER — Observation Stay (HOSPITAL_BASED_OUTPATIENT_CLINIC_OR_DEPARTMENT_OTHER): Payer: Medicare Other

## 2016-02-12 DIAGNOSIS — R55 Syncope and collapse: Secondary | ICD-10-CM

## 2016-02-12 DIAGNOSIS — R9431 Abnormal electrocardiogram [ECG] [EKG]: Secondary | ICD-10-CM

## 2016-02-12 LAB — BASIC METABOLIC PANEL
Anion gap: 7 (ref 5–15)
BUN: 12 mg/dL (ref 6–20)
CHLORIDE: 102 mmol/L (ref 101–111)
CO2: 30 mmol/L (ref 22–32)
CREATININE: 0.99 mg/dL (ref 0.61–1.24)
Calcium: 9.1 mg/dL (ref 8.9–10.3)
GFR calc non Af Amer: >60 mL/min (ref >60)
GLUCOSE: 87 mg/dL (ref 65–99)
Potassium: 3.8 mmol/L (ref 3.5–5.1)
Sodium: 139 mmol/L (ref 135–145)

## 2016-02-12 LAB — CBC
HCT: 42.3 % (ref 39.0–52.0)
Hemoglobin: 14.2 g/dL (ref 13.0–17.0)
MCH: 30.8 pg (ref 26.0–34.0)
MCHC: 33.6 g/dL (ref 30.0–36.0)
MCV: 91.8 fL (ref 78.0–100.0)
PLATELETS: 198 10*3/uL (ref 150–400)
RBC: 4.61 MIL/uL (ref 4.22–5.81)
RDW: 14.4 % (ref 11.5–15.5)
WBC: 6.8 10*3/uL (ref 4.0–10.5)

## 2016-02-12 LAB — PROTIME-INR
INR: 1.93
Prothrombin Time: 22.3 seconds — ABNORMAL HIGH (ref 11.4–15.2)

## 2016-02-12 MED ORDER — NITROGLYCERIN 0.4 MG SL SUBL: 0.4000 mg | SUBLINGUAL_TABLET | SUBLINGUAL | Status: DC | PRN

## 2016-02-12 MED ORDER — LORATADINE 10 MG PO TABS
10.0000 mg | ORAL_TABLET | Freq: Every day | ORAL | Status: DC
  Administered 2016-02-12: 10 mg via ORAL
  Filled 2016-02-12: qty 1

## 2016-02-12 MED ORDER — BUTALBITAL-APAP-CAFFEINE 50-325-40 MG PO TABS: 1.0000 | ORAL_TABLET | Freq: Once | ORAL | Status: DC | PRN

## 2016-02-12 MED ORDER — ACETAMINOPHEN 325 MG PO TABS
650.0000 mg | ORAL_TABLET | ORAL | Status: DC | PRN
  Administered 2016-02-12: 650 mg via ORAL
  Filled 2016-02-12: qty 2

## 2016-02-12 MED ORDER — HYDROCHLOROTHIAZIDE 25 MG PO TABS
25.0000 mg | ORAL_TABLET | Freq: Every day | ORAL | Status: DC
  Administered 2016-02-12: 25 mg via ORAL
  Filled 2016-02-12: qty 1

## 2016-02-12 MED ORDER — ONDANSETRON HCL 4 MG/2ML IJ SOLN: 4.0000 mg | Freq: Four times a day (QID) | INTRAMUSCULAR | Status: DC | PRN

## 2016-02-12 MED ORDER — WARFARIN SODIUM 7.5 MG PO TABS: 7.5000 mg | ORAL_TABLET | Freq: Once | ORAL | Status: DC

## 2016-02-12 MED ORDER — WARFARIN - PHARMACIST DOSING INPATIENT: Freq: Every day | Status: DC

## 2016-02-12 MED ORDER — POTASSIUM CHLORIDE ER 10 MEQ PO TBCR
10.0000 meq | EXTENDED_RELEASE_TABLET | Freq: Every morning | ORAL | Status: DC
  Administered 2016-02-12: 10 meq via ORAL
  Filled 2016-02-12 (×2): qty 1

## 2016-02-12 MED ORDER — AMIODARONE HCL 200 MG PO TABS
100.0000 mg | ORAL_TABLET | Freq: Every morning | ORAL | Status: DC
  Administered 2016-02-12: 100 mg via ORAL
  Filled 2016-02-12: qty 1

## 2016-02-12 MED ORDER — PRAVASTATIN SODIUM 40 MG PO TABS: 80.0000 mg | ORAL_TABLET | Freq: Every evening | ORAL | Status: DC

## 2016-02-12 NOTE — Consult Note (Signed)
CARDIOLOGY CONSULT NOTE   Patient ID: [name] [name] MRN: [id number] DOB/AGE: [birth year] 81 y.o.  Admit date: 02/11/2016  Requesting Physician: Dr. Marlou Porch  Primary Physician:   Elsie Stain, MD Primary Cardiologist:  Dr. Caryl Comes  Reason for Consultation:  Syncope   HPI: [name] [name] is a 81 y.o. male with a history of CAD s/p CABG (2004), mild AS, RVOT VT on amiodarone, PAF on amiodarone and coumadin who presented to [facility] ED on 02/11/16 for evaluation of an episode of syncope. He was found to be in afib.   He had CABG in 2004. Cardiac catheterization 2007 demonstrated patent grafts. He has a history of PAF and RVOT ventricular tachycardia. He has been treated with amiodarone for ~7 years. He has had a chronic cough. Pulmonary function studies show DLCO of 77%.  He was seen by Dr. Rayann Heman in 11/2011 for evaluation for catheter ablation but opted to continue amiodarone therapy. Echocardiogram 2/16 demonstrated mild aortic stenosis with a mean gradient of 17; ejection fraction was 55-60%.  He was in his usual state of health until 10/19 ~ 3 am when he woke up to go to the bathroom and passed out after urinating. When he came to he noticed his pants were wet but otherwise felt okay and went back to bed. He did feel his pulse at that time which felt fast and irregular. Since that time he has been feeling okay but does have the feeling that he has been in afib. He does not remember feeling any prodrome. No chest pain. He states that he has had approximately 3 syncopal like episodes one of which occurred when he was bending over gardening and slightly dehydrated and nearly fainted, another one occurred after getting up from the bathroom in the middle of the night and walking back towards the bedroom. This one however he does not remember hitting the ground and he does not know how long he was "out".   He called the office and was told to go to the ER. He is very eager to leave and has  been feeling quite well. He can tell he is out of rhythm but it isn't bothering him. No CP or SOB. No LE edema, orthopnea or PND. No recurrent dizziness or syncope since being admitted.    Past Medical History:  Diagnosis Date  . Anticoagulated on Coumadin    followed in [facility]RR at [facility]  . Aortic stenosis    a. Echo 2/15: Severe LVH, EF 55-60%, Gr 1 DD, mild to mod AS (mean 20 mmHg, peak 31 mmHg), AVA 1.4-1.5 cm2, mild AI, MAC, trivial MR, LA upper limits of normal, RVSP 22 mmHg, mild RAE;   b. Echo 2/16:  EF 55-60%, no RWMA, mild AS (mean 17 mmHg, peak 27 mmHg), AVA 1.5 cm2, mild AI, MAC, mild LAE  . Arthritis   . Bladder neoplasm   . Chronic cough    NO CARDIAC OR PULMONARY RELATED  . Coronary artery disease CARDIOLOGIST-  DR KLEIN/ ALLRED   a. s/p CABG 2004, b. LHC with patent grafts 07/2005, ejection fraction 50%;  c. Myoview 2/15: no ischemia, EF 61%  . Dry eyes   . Dyslipidemia   . GERD (gastroesophageal reflux disease)   . HTN (hypertension)   . Mild aortic stenosis   . PAF (paroxysmal atrial fibrillation) ([place])    CHADS2-VASc:  4  . Paroxysmal atrial fibrillation (HCC)   . RVOT-VT (right ventricular outflow tract ventricular tachycardia) ([place])  a. Amiodarone Rx  . S/P CABG x 5 2004     Past Surgical History:  Procedure Laterality Date  . CARDIAC CATHETERIZATION  07-26-2005  DR GAMBLE   PRESERVED LVF/  EF 50%/ PATENT GRAFTS  . CARDIAC CATHETERIZATION  03-04-2003  DR GAMBLE   SEVERE 3 VESSEL DISEASE  . [place]   PAF/ FALSE POSITIVE STRESS TEST  . CATARACT EXTRACTION W/ INTRAOCULAR LENS IMPLANT Right   . CORONARY ARTERY BYPASS GRAFT  03-08-2003  DR [id number]   LIMA TO LAD/ SVG TO OM2/  SVG TO DIAGONAL / SVG TO PDA & PLA  . CYSTOSCOPY WITH BIOPSY N/A 12/25/2012   Procedure: CYSTOSCOPY WITH BLADDER BIOPSY;  Surgeon: Fredricka Bonine, MD;  Location: [facility];  Service: Urology;  Laterality: N/A;  .  ELECTROPHYSIOLOGY STUDY  07-27-2005  DR Carleene Overlie Krystiana Fornes   MAPPING --   RESULT NONINDUCIBLE VT OR SVT/  DX RIGHT VENTRICULAR OUTFLOW TRACT VT AND RULES OUT MORE MALIGNANT CAUSES OF VT  . INGUINAL HERNIA REPAIR Bilateral [place]  . KNEE ARTHROSCOPY Right 1994  . LUMBAR DISC SURGERY  1999   L4 -- L5  . REMOVAL VOCAL CORD POLYPS  1978  . TONSILLECTOMY  AS CHILD  . TRANSTHORACIC ECHOCARDIOGRAM  08-08-2011   MILD LVH/  EF [id number]  GRADE I DIASTOLIC DYSFUNCTION/ MILD AV STENOSIS    Allergies  Allergen Reactions  . Ace Inhibitors Other (See Comments)    COUGH  . Hycodan [Hydrocodone-Homatropine] Nausea And Vomiting  . Oxycodone Nausea Only    Pt states that he cannot take prescription pain medication  . Zocor [Simvastatin] Other (See Comments)    MYALGIA  . Cephalexin Rash    I have reviewed the patient's current medications . amiodarone  100 mg Oral q morning - 10a  . hydrochlorothiazide  25 mg Oral Daily  . loratadine  10 mg Oral Daily  . potassium chloride  10 mEq Oral q morning - 10a  . pravastatin  80 mg Oral QPM  . warfarin  7.5 mg Oral ONCE-1800  . Warfarin - Pharmacist Dosing Inpatient   Does not apply q1800     acetaminophen, butalbital-acetaminophen-caffeine, nitroGLYCERIN, ondansetron (ZOFRAN) IV  Prior to Admission medications   Medication Sig Start Date End Date Taking? Authorizing Provider  acetaminophen (TYLENOL) 325 MG tablet Take 650 mg by mouth once as needed for moderate pain or headache.   Yes Historical Provider, MD  amiodarone (PACERONE) 200 MG tablet Take 100 mg by mouth every morning.  03/14/12  Yes Deboraha Sprang, MD  butalbital-acetaminophen-caffeine (FIORICET, ESGIC) [phone] MG per tablet Take 1 tablet by mouth once as needed for headache.    Yes Historical Provider, MD  cetirizine (ZYRTEC) 10 MG tablet Take 10 mg by mouth daily.   Yes Historical Provider, MD  cholecalciferol (VITAMIN D) 1000 UNITS tablet Take 1,000 Units by mouth daily.   Yes  Historical Provider, MD  hydrochlorothiazide (HYDRODIURIL) 25 MG tablet TAKE 1 TABLET (25 MG TOTAL) BY MOUTH EVERY MORNING. 11/25/15  Yes Deboraha Sprang, MD  Polyethyl Glycol-Propyl Glycol (SYSTANE ULTRA OP) Apply 1-2 drops to eye once as needed (dry eyes.).    Yes Historical Provider, MD  potassium chloride (K-DUR) 10 MEQ tablet Take 10 mEq by mouth every morning. Take as directed 05/24/12  Yes Deboraha Sprang, MD  pravastatin (PRAVACHOL) 80 MG tablet Take 80 mg by mouth every evening.    Yes Historical  Provider, MD  warfarin (COUMADIN) 5 MG tablet PATIENT TAKES 1.5 MG (1 1/2 TABLET) BY MOUTH DAILY Patient taking differently: Take 7.5 mg by mouth daily. PATIENT TAKES 7.5 MG (1 1/2 TABLET) BY MOUTH DAILY. 11/25/15  Yes Deboraha Sprang, MD     Social History   Social History  . Marital status: Married    Spouse name: N/A  . Number of children: 3  . Years of education: N/A   Occupational History  . Retired    Social History Main Topics  . Smoking status: Former Smoker    Packs/day: 1.00    Years: 25.00    Types: Cigarettes    Quit date: 04/25/1980  . Smokeless tobacco: Never Used  . Alcohol use No  . Drug use: No  . Sexual activity: Not on file   Other Topics Concern  . Not on file   Social History Narrative   [employer]56-'58, overseas to [place]   Some care through [place]   No service disability    Family Status  Relation Status  . Mother Deceased   no neg card hx  . Father Deceased  . Maternal Grandmother Deceased  . Maternal Grandfather Deceased  . Paternal Grandmother Deceased  . Paternal Grandfather Deceased   Family History  Problem Relation Age of Onset  . Cancer Mother     ROS:  Full 14 point review of systems complete and found to be negative unless listed above.  Physical Exam: Blood pressure (!) 122/95, pulse 80, temperature 97.8 F (36.6 C), temperature source Oral, resp. rate 16, height 5\' 10"  (1.778 m), weight 199 lb 11.2 oz (90.6 kg), SpO2 93 %.  General: Well  developed, well nourished, male in no acute distress Head: Eyes PERRLA, No xanthomas.   Normocephalic and atraumatic, oropharynx without edema or exudate. Dentition:  Lungs: CTAB Heart: HRRR S1 S2, no rub/gallop, Heart irregular rate and rhythm with S1, S2  murmur. pulses are 2+ extrem.   Neck: No carotid bruits. No lymphadenopathy. No JVD. Abdomen: Bowel sounds present, abdomen soft and non-tender without masses or hernias noted. Msk:  No spine or cva tenderness. No weakness, no joint deformities or effusions. Extremities: No clubbing or cyanosis. No LE edema.  Neuro: Alert and oriented X 3. No focal deficits noted. Psych:  Good affect, responds appropriately Skin: No rashes or lesions noted.  Labs:   Lab Results  Component Value Date   WBC 6.8 02/12/2016   HGB 14.2 02/12/2016   HCT 42.3 02/12/2016   MCV 91.8 02/12/2016   PLT 198 02/12/2016    Recent Labs  02/12/16 0215  INR 1.93    Recent Labs Lab 02/11/16 1717 02/12/16 0215  NA  --  139  K  --  3.8  CL  --  102  CO2  --  30  BUN  --  12  CREATININE  --  0.99  CALCIUM  --  9.1  PROT 7.5  --   BILITOT 0.4  --   ALKPHOS 59  --   ALT 17  --   AST 29  --   GLUCOSE  --  87  ALBUMIN 4.4  --    No results found for: MG No results for input(s): CKTOTAL, CKMB, TROPONINI in the last 72 hours.  Recent Labs  02/11/16 1348  TROPIPOC 0.00   No results found for: PROBNP Lab Results  Component Value Date   CHOL 166 12/18/2014   HDL 65 12/18/2014   LDLCALC 86  12/18/2014   TRIG 75 12/18/2014   No results found for: DDIMER No results found for: LIPASE, AMYLASE TSH  Date/Time Value Ref Range Status  06/11/2015 03:05 PM 1.66 0.35 - 4.50 uIU/mL Final   No results found for: VITAMINB12, FOLATE, FERRITIN, TIBC, IRON, RETICCTPCT  Echo: 06/13/2014 LV EF: 55% -  60% Study Conclusions - Left ventricle: The cavity size was normal. Wall thickness was normal. Systolic function was normal. The estimated  ejection fraction was in the range of 55% to 60%. Wall motion was normal; there were no regional wall motion abnormalities. Left ventricular diastolic function parameters were normal. - Aortic valve: There was mild stenosis. There was mild regurgitation. - Mitral valve: Calcified annulus. - Left atrium: The atrium was mildly dilated.  ECG:  afib HR 102, RBBB   Radiology:  Ct Head Wo Contrast  Result Date: 02/11/2016 CLINICAL DATA:  Loss of consciousness this morning. Right frontal headaches. EXAM: CT HEAD WITHOUT CONTRAST TECHNIQUE: Contiguous axial images were obtained from the base of the skull through the vertex without intravenous contrast. COMPARISON:  None. FINDINGS: Brain: There is no evidence for acute hemorrhage, hydrocephalus, mass lesion, or abnormal extra-axial fluid collection. No definite CT evidence for acute infarction. Diffuse loss of parenchymal volume is consistent with atrophy. Patchy low attenuation in the deep hemispheric and periventricular white matter is nonspecific, but likely reflects chronic microvascular ischemic demyelination. Vascular: Atherosclerotic calcification is visualized in the carotid arteries. No dense MCA sign. Major dural sinuses are unremarkable. Skull: No evidence for fracture. No worrisome lytic or sclerotic lesion. Sinuses/Orbits: The visualized paranasal sinuses and mastoid air cells are clear. Visualized portions of the globes and intraorbital fat are unremarkable. Other: None. IMPRESSION: 1. No acute intracranial abnormality. 2. Atrophy with chronic small vessel white matter ischemic disease. Electronically Signed   By: Misty Stanley M.D.   On: 02/11/2016 18:02   Dg Chest Portable 1 View  Result Date: 02/11/2016 CLINICAL DATA:  81 year old male with syncope, awoke on the floor. Initial encounter. EXAM: PORTABLE CHEST 1 VIEW COMPARISON:  Chest radiographs 07/09/2012. FINDINGS: Portable AP upright view at 1648 hours. Stable cardiac size and  mediastinal contours. Calcified aortic atherosclerosis. Stable sequelae of CABG. Lung volumes are within normal limits. Allowing for portable technique the lungs are clear. No pneumothorax or pleural effusion. Visualized tracheal air column is within normal limits. IMPRESSION: No acute cardiopulmonary abnormality. Calcified aortic atherosclerosis. Electronically Signed   By: Genevie Ann M.D.   On: 02/11/2016 17:04    ASSESSMENT AND PLAN:    Active Problems:   Syncope  1. Syncope: unclear whether this was a vagal event related to urinating. He denies any prodrome or confusion afterwards. He is feeling quite well now and eager to home. Plan will be to update 2D ECHO and possibly implant loop recorder.   2. CAD: s/p CABG in 2004 and patent grafts by [place] in 2007. No Asa given coumadin use. No ischemic signs/symptoms.   3. PAF: back in afib by ECG and tele. Continue long term Coumadin given stroke risk. CHADSVASC score of at least 4 (HTN, age, vasc dz). He remains on Amiodarone 100mg  daily.    4. Aortic stenosis: 2D ECHO 2/16 with mild AS and mean gradient 17 mmHg.  No symptoms to suggest worsening AS. Will update 2D ECHO   5. HTN:  BP is well controlled.  Continue to monitor.    6. RVOT Ventricular Tachycardia: Long term Amiodarone therapy. No VT noted on telemetry  7. HLD: Continue  statin.     Signed: Angelena Form, PA-C 02/12/2016 11:03 AM  Pager [phone]  Co-Sign MD  EP Attending  Patient seen and examined. Agree with the findings as noted above. The exam reflects my findings as documented as well. The patient has had syncope in the past. He also has PAF and I have recommended he undergo insertion of an ILR but he declines. He will discuss this further with Dr. Caryl Comes. No driving.  Mikle Bosworth.D.

## 2016-02-12 NOTE — Progress Notes (Signed)
Patient admitted from ED . Alert and orientated. Placed on tele box 35. Skin is clear of wounds or sores.  He does have some bruising and patient states it is due to the coumadin he takes. Oriented to room, phone and call bell.  He is standby with moderate fall score, and needs a standby. Safety is maintained.

## 2016-02-12 NOTE — Progress Notes (Signed)
ANTICOAGULATION CONSULT NOTE - Initial Consult  Pharmacy Consult for Coumadin Indication: atrial fibrillation  Allergies  Allergen Reactions  . Ace Inhibitors Other (See Comments)    COUGH  . Hycodan [Hydrocodone-Homatropine] Nausea And Vomiting  . Oxycodone Nausea Only    Pt states that he cannot take prescription pain medication  . Zocor [Simvastatin] Other (See Comments)    MYALGIA  . Cephalexin Rash    Patient Measurements: Height: 5\' 10"  (177.8 cm) Weight: 199 lb 11.2 oz (90.6 kg) IBW/kg (Calculated) : 73  Vital Signs: Temp: 97.5 F (36.4 C) (10/19 2226) Temp Source: Axillary (10/19 2226) BP: 158/95 (10/19 2226) Pulse Rate: 167 (10/19 2226)  Labs:  Recent Labs  02/10/16 1259 02/11/16 1306 02/11/16 1717  HGB  --  15.0  --   HCT  --  43.4  --   PLT  --  210  --   LABPROT  --   --  20.4*  INR 2.1  --  1.72  CREATININE  --  0.97  --     Estimated Creatinine Clearance: 69.9 mL/min (by C-G formula based on SCr of 0.97 mg/dL).   Medical History: Past Medical History:  Diagnosis Date  . Anticoagulated on Coumadin    [facility]  . Aortic stenosis    a. Echo 2/15: Severe LVH, EF 55-60%, Gr 1 DD, mild to mod AS (mean 20 mmHg, peak 31 mmHg), AVA 1.4-1.5 cm2, mild AI, MAC, trivial MR, LA upper limits of normal, RVSP 22 mmHg, mild RAE;   b. Echo 2/16:  EF 55-60%, no RWMA, mild AS (mean 17 mmHg, peak 27 mmHg), AVA 1.5 cm2, mild AI, MAC, mild LAE  . Arthritis   . Bladder neoplasm   . Chronic cough    NO CARDIAC OR PULMONARY RELATED  . Coronary artery disease CARDIOLOGIST-  DR KLEIN/ ALLRED   a. s/p CABG 2004, b. LHC with patent grafts 07/2005, ejection fraction 50%;  c. Myoview 2/15: no ischemia, EF 61%  . Dry eyes   . Dyslipidemia   . GERD (gastroesophageal reflux disease)   . HTN (hypertension)   . Mild aortic stenosis   . PAF (paroxysmal atrial fibrillation) ([place])    CHADS2-VASc:  4  . Paroxysmal atrial fibrillation (HCC)   . RVOT-VT  (right ventricular outflow tract ventricular tachycardia) ([place])    a. Amiodarone Rx  . S/P CABG x 5 2004    Medications:  Prescriptions Prior to Admission  Medication Sig Dispense Refill Last Dose  . acetaminophen (TYLENOL) 325 MG tablet Take 650 mg by mouth once as needed for moderate pain or headache.   Past Month at Unknown time  . amiodarone (PACERONE) 200 MG tablet Take 100 mg by mouth every morning.    02/11/2016 at 8a  . butalbital-acetaminophen-caffeine (FIORICET, ESGIC) 50-325-40 MG per tablet Take 1 tablet by mouth once as needed for headache.    02/10/2016 at Unknown time  . cetirizine (ZYRTEC) 10 MG tablet Take 10 mg by mouth daily.   02/11/2016 at Unknown time  . cholecalciferol (VITAMIN D) 1000 UNITS tablet Take 1,000 Units by mouth daily.   02/11/2016 at Unknown time  . hydrochlorothiazide (HYDRODIURIL) 25 MG tablet TAKE 1 TABLET (25 MG TOTAL) BY MOUTH EVERY MORNING. 90 tablet 2 02/11/2016 at Unknown time  . Polyethyl Glycol-Propyl Glycol (SYSTANE ULTRA OP) Apply 1-2 drops to eye once as needed (dry eyes.).    02/11/2016 at Unknown time  . potassium chloride (K-DUR) 10 MEQ tablet  Take 10 mEq by mouth every morning. Take as directed   02/11/2016 at Unknown time  . pravastatin (PRAVACHOL) 80 MG tablet Take 80 mg by mouth every evening.    02/10/2016 at Unknown time  . warfarin (COUMADIN) 5 MG tablet PATIENT TAKES 1.5 MG (1 1/2 TABLET) BY MOUTH DAILY (Patient taking differently: Take 7.5 mg by mouth daily. PATIENT TAKES 7.5 MG (1 1/2 TABLET) BY MOUTH DAILY.) 50 tablet 3 02/11/2016 at 1p  . warfarin (COUMADIN) 5 MG tablet Take 7.5 mg by mouth daily. Patient takes 7.5 mg every day. ( 1 and 1/2 tablet )   02/11/2016 at 1230p    Assessment: 81 yo M presents with syncope. Pt on coumadin PTA for afib. Admission INR 1.72 (subtherapeutic). CBC ok on admission. Home dose: 7.5mg  daily - dose taken 10/19 1300.  Goal of Therapy:  INR 2-3 Monitor platelets by anticoagulation protocol: Yes    Plan:  Daily INR  Sherlon Handing, PharmD, BCPS Clinical pharmacist, pager [phone] 02/12/2016,12:59 AM

## 2016-02-12 NOTE — Discharge Summary (Signed)
Discharge Summary    Patient ID: [name] [name],  MRN: [id number], DOB/AGE: 1935/03/23 81 y.o.  Admit date: 02/11/2016 Discharge date: 02/12/2016  Primary Care Provider: Elsie Stain Primary Cardiologist: Dr. Caryl Comes   Discharge Diagnoses    Principal Problem:   Syncope Active Problems:   Essential hypertension   PAF (paroxysmal atrial fibrillation) (HCC)   RVOT ventricular tachycardia (HCC)   CAD (coronary artery disease)   Hyperlipidemia   GERD (gastroesophageal reflux disease)   Aortic stenosis   Allergies Allergies  Allergen Reactions  . Ace Inhibitors Other (See Comments)    COUGH  . Hycodan [Hydrocodone-Homatropine] Nausea And Vomiting  . Oxycodone Nausea Only    Pt states that he cannot take prescription pain medication  . Zocor [Simvastatin] Other (See Comments)    MYALGIA  . Cephalexin Rash     History of Present Illness     [name] is a 81 y.o. male with a history of CAD s/p CABG (2004), mild AS, RVOT VT on amiodarone, PAF on amiodarone and coumadin who presented to [facility] ED on 02/11/16 for evaluation of an episode of syncope. He was found to be in afib.   He had CABG in 2004. Cardiac catheterization 2007 demonstrated patent grafts. He has a history of PAF and RVOT ventricular tachycardia. He has been treated with amiodarone for ~7 years. He has had a chronic cough. Pulmonary function studies show DLCO of 77%.  He was seen by Dr. Rayann Heman in 11/2011 for evaluation for catheter ablation but opted to continue amiodarone therapy. Echocardiogram 2/16 demonstrated mild aortic stenosis with a mean gradient of 17; ejection fraction was 55-60%.  He was in his usual state of health until 10/19 ~ 3 am when he woke up to go to the bathroom and passed out after urinating. When he came to he noticed his pants were wet but otherwise felt okay and went back to bed. He did feel his pulse at that time which felt fast and irregular. Since that time he has  been feeling okay but does have the feeling that he has been in afib. He does not remember feeling any prodrome. No chest pain. He states that he has had approximately 3 syncopal like episodes one of which occurred when he was bending over gardening and slightly dehydrated and nearly fainted, another one occurred after getting up from the bathroom in the middle of the night and walking back towards the bedroom. These have occurred over the past 5 years or so. This one however he does not remember hitting the ground and he does not know how long he was "out".  He called the office the following day and was told to go to the ER. He is very eager to leave and has been feeling quite well. He can tell he is out of rhythm but it isn't bothering him. No CP or SOB. No LE edema, orthopnea or PND. No recurrent dizziness or syncope since being admitted.   Hospital Course     Consultants: EP  1. Syncope: unclear whether this was a vagal event related to urinating but he has had other episodes that weren't related to urination. He denies any prodrome or confusion afterwards. He is feeling quite well now and eager to home. Plan was for 2D ECHO and LOOP recorder placement given his infrequent recurrent syncope, LBBB and PAF with recurrence during this admission. He is refusing LOOP recorder placement at this time and requests discharge home ASAP. Will  arrange an outpatient echocardiogram, follow up in the afib clinic in 1-2 weeks and follow up with Dr. Caryl Comes.   2. CAD: s/p CABG in 2004 and patent grafts by [place] in 2007. No Asa given coumadin use. No ischemic signs/symptoms.   3. PAF: back in afib by ECG and tele. Heart rate relatively well controlled. Continue long term Coumadin given stroke risk. INR 1.93 today. CHADSVASC score of at least 4 (HTN, age, vasc dz). He remains on Amiodarone 100mg  daily. Will have him seen in the afib clinic in 1-2 weeks.  4. Aortic stenosis: 2D ECHO 2/16 with mild AS and mean  gradient 17 mmHg. No symptoms to suggest worsening AS. Will update 2D ECHO as an outpatient   5. HTN:  BP is well controlled. Continue to monitor.   6. RVOT Ventricular Tachycardia: Long term Amiodarone therapy. No VT noted on telemetry. Plan was for LOOP but patient refusing at this time.   7. HLD: Continue statin.    The patient has had an uncomplicated hospital course and is recovering well. He has been seen by Dr. Lovena Le today and deemed ready for discharge home. A staff message for follow-up appointments has been sent. Discharge medications are listed below.  _____________  Discharge Vitals Blood pressure 131/86, pulse 93, temperature 97.7 F (36.5 C), temperature source Oral, resp. rate 20, height 5\' 10"  (1.778 m), weight 199 lb 11.2 oz (90.6 kg), SpO2 94 %.  Filed Weights   02/11/16 1304 02/11/16 2226  Weight: 200 lb (90.7 kg) 199 lb 11.2 oz (90.6 kg)    Labs & Radiologic Studies     CBC  Recent Labs  02/11/16 1306 02/12/16 0215  WBC 7.2 6.8  HGB 15.0 14.2  HCT 43.4 42.3  MCV 91.0 91.8  PLT 210 [id number]   Basic Metabolic Panel  Recent Labs  02/11/16 1306 02/12/16 0215  NA 136 139  K 3.8 3.8  CL 100* 102  CO2 27 30  GLUCOSE 100* 87  BUN 16 12  CREATININE 0.97 0.99  CALCIUM 9.4 9.1   Liver Function Tests  Recent Labs  02/11/16 1717  AST 29  ALT 17  ALKPHOS 59  BILITOT 0.4  PROT 7.5  ALBUMIN 4.4   No results for input(s): LIPASE, AMYLASE in the last 72 hours. Cardiac Enzymes No results for input(s): CKTOTAL, CKMB, CKMBINDEX, TROPONINI in the last 72 hours. BNP Invalid input(s): POCBNP D-Dimer No results for input(s): DDIMER in the last 72 hours. Hemoglobin A1C No results for input(s): HGBA1C in the last 72 hours. Fasting Lipid Panel No results for input(s): CHOL, HDL, LDLCALC, TRIG, CHOLHDL, LDLDIRECT in the last 72 hours. Thyroid Function Tests No results for input(s): TSH, T4TOTAL, T3FREE, THYROIDAB in the last 72 hours.  Invalid  input(s): FREET3  Ct Head Wo Contrast  Result Date: 02/11/2016 CLINICAL DATA:  Loss of consciousness this morning. Right frontal headaches. EXAM: CT HEAD WITHOUT CONTRAST TECHNIQUE: Contiguous axial images were obtained from the base of the skull through the vertex without intravenous contrast. COMPARISON:  None. FINDINGS: Brain: There is no evidence for acute hemorrhage, hydrocephalus, mass lesion, or abnormal extra-axial fluid collection. No definite CT evidence for acute infarction. Diffuse loss of parenchymal volume is consistent with atrophy. Patchy low attenuation in the deep hemispheric and periventricular white matter is nonspecific, but likely reflects chronic microvascular ischemic demyelination. Vascular: Atherosclerotic calcification is visualized in the carotid arteries. No dense MCA sign. Major dural sinuses are unremarkable. Skull: No evidence for fracture. No worrisome lytic  or sclerotic lesion. Sinuses/Orbits: The visualized paranasal sinuses and mastoid air cells are clear. Visualized portions of the globes and intraorbital fat are unremarkable. Other: None. IMPRESSION: 1. No acute intracranial abnormality. 2. Atrophy with chronic small vessel white matter ischemic disease. Electronically Signed   By: Misty Stanley M.D.   On: 02/11/2016 18:02   Dg Chest Portable 1 View  Result Date: 02/11/2016 CLINICAL DATA:  81 year old male with syncope, awoke on the floor. Initial encounter. EXAM: PORTABLE CHEST 1 VIEW COMPARISON:  Chest radiographs 07/09/2012. FINDINGS: Portable AP upright view at 1648 hours. Stable cardiac size and mediastinal contours. Calcified aortic atherosclerosis. Stable sequelae of CABG. Lung volumes are within normal limits. Allowing for portable technique the lungs are clear. No pneumothorax or pleural effusion. Visualized tracheal air column is within normal limits. IMPRESSION: No acute cardiopulmonary abnormality. Calcified aortic atherosclerosis. Electronically Signed    By: Genevie Ann M.D.   On: 02/11/2016 17:04     Diagnostic Studies/Procedures    none _____________    Disposition   Pt is being discharged home today in good condition.  Follow-up Plans & Appointments    Follow-up Information    Virl Axe, MD .   Specialty:  Cardiology Why:  The office will call you to schedule an echo and follow up wtih Dr. Margurite Auerbach information: [id number] [phone]            Discharge Medications     Medication List    TAKE these medications   acetaminophen 325 MG tablet Commonly known as:  TYLENOL Take 650 mg by mouth once as needed for moderate pain or headache.   amiodarone 200 MG tablet Commonly known as:  PACERONE Take 100 mg by mouth every morning.   butalbital-acetaminophen-caffeine 50-325-40 MG tablet Commonly known as:  FIORICET, ESGIC Take 1 tablet by mouth once as needed for headache.   cetirizine 10 MG tablet Commonly known as:  ZYRTEC Take 10 mg by mouth daily.   cholecalciferol 1000 units tablet Commonly known as:  VITAMIN D Take 1,000 Units by mouth daily.   hydrochlorothiazide 25 MG tablet Commonly known as:  HYDRODIURIL TAKE 1 TABLET (25 MG TOTAL) BY MOUTH EVERY MORNING.   potassium chloride 10 MEQ tablet Commonly known as:  K-DUR Take 10 mEq by mouth every morning. Take as directed   pravastatin 80 MG tablet Commonly known as:  PRAVACHOL Take 80 mg by mouth every evening.   SYSTANE ULTRA OP Apply 1-2 drops to eye once as needed (dry eyes.).   warfarin 5 MG tablet Commonly known as:  COUMADIN PATIENT TAKES 1.5 MG (1 1/2 TABLET) BY MOUTH DAILY What changed:  how much to take  how to take this  when to take this  additional instructions          Outstanding Labs/Studies   2D ECHO  Duration of Discharge Encounter   Greater than 30 minutes including physician time.  Signed, Angelena Form PA-C 02/12/2016, 2:51 PM  EP  Attending  Patient seen and examined. Agree with above. He is stable for DC. He refuses ILR.  Cristopher Peru, M.D.

## 2016-02-12 NOTE — Telephone Encounter (Signed)
New Message  Pt wife call returning to speak with RN. Pt wife states she is unsatisfied with the care the pt is receiving at the hospital. Pt would like to know if pt can receive echo at our office instead of the hospital. Pt wife states she called pt this morning and pt had not receive morning meds. ot is concerned with care and would rather pt be seen by Dr. Caryl Comes. Please call back to discuss

## 2016-02-12 NOTE — Progress Notes (Signed)
  Echocardiogram 2D Echocardiogram has been performed.  [name] [name] 02/12/2016, 4:11 PM

## 2016-02-12 NOTE — Telephone Encounter (Signed)
Returned Pt's wife's call.  Wife is upset with the hospital care.  Says it puts too much stress on the patient to be in the hospital.  They are waiting for an Echocardiogram to be performed and she said she was told that this would determine when he may be discharged.

## 2016-02-12 NOTE — Progress Notes (Signed)
ANTICOAGULATION CONSULT NOTE - Initial Consult  Pharmacy Consult for Coumadin Indication: atrial fibrillation  Allergies  Allergen Reactions  . Ace Inhibitors Other (See Comments)    COUGH  . Hycodan [Hydrocodone-Homatropine] Nausea And Vomiting  . Oxycodone Nausea Only    Pt states that he cannot take prescription pain medication  . Zocor [Simvastatin] Other (See Comments)    MYALGIA  . Cephalexin Rash    Patient Measurements: Height: 5\' 10"  (177.8 cm) Weight: 199 lb 11.2 oz (90.6 kg) IBW/kg (Calculated) : 73  Vital Signs: Temp: 97.8 F (36.6 C) (10/20 0443) Temp Source: Oral (10/20 0443) BP: 122/95 (10/20 0443) Pulse Rate: 80 (10/20 0443)  Labs:  Recent Labs  02/10/16 1259 02/11/16 1306 02/11/16 1717 02/12/16 0215  HGB  --  15.0  --  14.2  HCT  --  43.4  --  42.3  PLT  --  210  --  198  LABPROT  --   --  20.4* 22.3*  INR 2.1  --  1.72 1.93  CREATININE  --  0.97  --  0.99    Estimated Creatinine Clearance: 68.5 mL/min (by C-G formula based on SCr of 0.99 mg/dL).  Assessment: 81 yo M presents with syncope.   PMH: afib, CAD (s/p CABG 2004), mild AS, HLD, HTN  Anticoag: coumadin PTA for afib. Admission INR 1.72  INR now 1.93  Home dose: 7.5mg  daily - dose taken 10/19 1300.  Nephro: SCr 0.99   Heme/Onc: H&H 14.2/42.3, Plt 198  Goal of Therapy:  INR 2-3 Monitor platelets by anticoagulation protocol: Yes   Plan:  Warfarin 7.5 mg x 1 Daily INR  Levester Fresh, PharmD, BCPS, [facility] Clinical Pharmacist Pager [phone] 02/12/2016 10:43 AM

## 2016-02-13 LAB — ECHOCARDIOGRAM COMPLETE
HEIGHTINCHES: 70 in
Weight: 3195.2 oz

## 2016-02-15 ENCOUNTER — Telehealth: Payer: Self-pay

## 2016-02-15 ENCOUNTER — Encounter: Payer: Self-pay | Admitting: Internal Medicine

## 2016-02-15 ENCOUNTER — Telehealth: Payer: Self-pay | Admitting: Cardiology

## 2016-02-15 NOTE — Telephone Encounter (Signed)
Pt of Dr Olin Pia.  Please see recent pt advice request from pt.

## 2016-02-15 NOTE — Telephone Encounter (Signed)
New message   Pt verbalized that Dr.Skains ordered the Echo and he wants the results

## 2016-02-15 NOTE — Telephone Encounter (Signed)
Pt states he had an echocardiogram done on 10/20 and is calling for those results. Thanks .

## 2016-02-15 NOTE — Telephone Encounter (Signed)
The patient was recently sent to the ER for syncope and found to be in a-fib. He had an echo done on 02/12/16.  He was offered a loop recorder implant, but refused during hospitalization. Per discharge summary, the patient is to follow up with the a-fib clinic in 1-2 weeks and Dr. Caryl Comes thereafter.  Will forward to Dr. Caryl Comes to review echo when he is back in the office, but will also forward to [place] to follow up on a-fib clinic appt.

## 2016-02-16 ENCOUNTER — Telehealth: Payer: Self-pay | Admitting: Internal Medicine

## 2016-02-16 NOTE — Telephone Encounter (Signed)
Per report, mild-to-moderate aortic stenosis. Echo okay o/w. This was handled through cardiology and they should be contacting the patient about the result. If he does not hear from them soon, please have him call cardiology. Thanks.

## 2016-02-16 NOTE — Telephone Encounter (Signed)
Patient advised.

## 2016-02-16 NOTE — Telephone Encounter (Signed)
Pt had an echo in the hospital on 02/12/16. Pt said that the report is in my chart, he tried to read it, but he needs for nurse to explained it to him.  Pt was made aware that Dr Caryl Comes is out of the country and will return next week. Heather Dr Olin Pia nurse sent him a letter via my chart with some information. The Letter was read to pt. Pt states will wait til next week to hear from our office after  Dr. Caryl Comes review echo. Pt would like to have MD's  impression and any recommendations.

## 2016-02-16 NOTE — Telephone Encounter (Signed)
Follow Up:     Pt would like his echo results from 02-12-16 to be explained to him. He saw the results in my chart,did not understand it at all. If pt is not at [facility] call [phone].

## 2016-02-17 NOTE — Telephone Encounter (Signed)
See 10/20 phone note.

## 2016-02-21 NOTE — Telephone Encounter (Signed)
H  prob needs to see [place] sooner than later;  Has BBB also so ? Interm complete heart block

## 2016-02-23 ENCOUNTER — Encounter (HOSPITAL_COMMUNITY): Payer: Self-pay | Admitting: Nurse Practitioner

## 2016-02-23 ENCOUNTER — Ambulatory Visit (HOSPITAL_COMMUNITY)
Admission: RE | Admit: 2016-02-23 | Discharge: 2016-02-23 | Disposition: A | Discharge status: Home / Self Care | Payer: Medicare Other | Source: Ambulatory Visit | Attending: Nurse Practitioner | Admitting: Nurse Practitioner

## 2016-02-23 VITALS — BP 146/86 | HR 64 | Ht 70.0 in | Wt 209.0 lb

## 2016-02-23 DIAGNOSIS — Z951 Presence of aortocoronary bypass graft: Secondary | ICD-10-CM | POA: Diagnosis not present

## 2016-02-23 DIAGNOSIS — I350 Nonrheumatic aortic (valve) stenosis: Secondary | ICD-10-CM | POA: Diagnosis not present

## 2016-02-23 DIAGNOSIS — I2510 Atherosclerotic heart disease of native coronary artery without angina pectoris: Secondary | ICD-10-CM | POA: Insufficient documentation

## 2016-02-23 DIAGNOSIS — Z87891 Personal history of nicotine dependence: Secondary | ICD-10-CM | POA: Insufficient documentation

## 2016-02-23 DIAGNOSIS — Z7901 Long term (current) use of anticoagulants: Secondary | ICD-10-CM | POA: Insufficient documentation

## 2016-02-23 DIAGNOSIS — M1990 Unspecified osteoarthritis, unspecified site: Secondary | ICD-10-CM | POA: Diagnosis not present

## 2016-02-23 DIAGNOSIS — I480 Paroxysmal atrial fibrillation: Secondary | ICD-10-CM | POA: Diagnosis not present

## 2016-02-23 DIAGNOSIS — I10 Essential (primary) hypertension: Secondary | ICD-10-CM | POA: Insufficient documentation

## 2016-02-23 DIAGNOSIS — K219 Gastro-esophageal reflux disease without esophagitis: Secondary | ICD-10-CM | POA: Insufficient documentation

## 2016-02-23 DIAGNOSIS — R55 Syncope and collapse: Secondary | ICD-10-CM | POA: Diagnosis not present

## 2016-02-23 DIAGNOSIS — E785 Hyperlipidemia, unspecified: Secondary | ICD-10-CM | POA: Insufficient documentation

## 2016-02-24 NOTE — Progress Notes (Signed)
Primary Care Physician: Elsie Stain, MD Referring Physician: [facility] f/u EP: Dr. Theresia Bough is a 81 y.o. male with a h/o history of CAD s/p CABG (2004), mild AS, RVOT VT on amiodarone, PAF on amiodarone and coumadin who presented to [facility] ED on 02/11/16 for evaluation of an episode of syncope. He was found to be in afib. Marland Kitchen He has been treated with amiodarone for ~7 years. He has had a chronic cough. Pulmonary function studies show DLCO of 77%. He was seen by Dr. Rayann Heman in 11/2011 for evaluation for catheter ablation but opted to continue amiodarone therapy.  He woke up on 10/19 to empty his bladder. Next thing he was aware of was being on his knees in front of the toilet. He struggled to go back to bed and noted his PJ's were wet. When he awoke the next am he was in afib and noted that he had urinated in the floor. He has history of 2 other syncopal episodes over the last 5 years,  one of which occurred when he was bending over gardening and slightly dehydrated and nearly fainted, another one occurred after getting up from the bathroom in the middle of the night and walking back towards the bedroom.  He is in the afib clinic for f/u and has returned to [place]. After hospitalization, he continued to have some afib so he increased his amiodarone to 200 mg a day for the last 4 days. The last two days, he has had regular rhythm. He is interested in an ablation or change to Tikosyn for concern of long term side effects from amiodarone. A  LinQ monitor was also discussed while he was in the hospital for concerns of syncope but he declined. He currently feels back to his baseline.  Today, he denies symptoms of palpitations, chest pain, shortness of breath, orthopnea, PND, lower extremity edema, dizziness, presyncope, syncope, or neurologic sequela. The patient is tolerating medications without difficulties and is otherwise without complaint today.   Past Medical History:  Diagnosis Date  .  Anticoagulated on Coumadin    followed in CVRR at [facility]  . Aortic stenosis    a. Echo 2/15: Severe LVH, EF 55-60%, Gr 1 DD, mild to mod AS (mean 20 mmHg, peak 31 mmHg), AVA 1.4-1.5 cm2, mild AI, MAC, trivial MR, LA upper limits of normal, RVSP 22 mmHg, mild RAE;   b. Echo 2/16:  EF 55-60%, no RWMA, mild AS (mean 17 mmHg, peak 27 mmHg), AVA 1.5 cm2, mild AI, MAC, mild LAE  . Arthritis   . Bladder neoplasm   . Chronic cough    NO CARDIAC OR PULMONARY RELATED  . Coronary artery disease CARDIOLOGIST-  DR KLEIN/ [facility]RED   a. s/p CABG 2004, b. LHC with patent grafts 07/2005, ejection fraction 50%;  c. Myoview 2/15: no ischemia, EF 61%  . Dry eyes   . Dyslipidemia   . GERD (gastroesophageal reflux disease)   . HTN (hypertension)   . Mild aortic stenosis   . PAF (paroxysmal atrial fibrillation) ([place])    CHADS2-VASc:  4  . Paroxysmal atrial fibrillation (HCC)   . RVOT-VT (right ventricular outflow tract ventricular tachycardia) ([place])    a. Amiodarone Rx  . S/P CABG x 5 2004   Past Surgical History:  Procedure Laterality Date  . CARDIAC CATHETERIZATION  07-26-2005  DR GAMBLE   PRESERVED LVF/  EF 50%/ PATENT GRAFTS  . CARDIAC CATHETERIZATION  03-04-2003  DR GAMBLE  SEVERE 3 VESSEL DISEASE  . [place]   PAF/ FALSE POSITIVE STRESS TEST  . CATARACT EXTRACTION W/ INTRAOCULAR LENS IMPLANT Right   . CORONARY ARTERY BYPASS GRAFT  03-08-2003  DR [id number]   LIMA TO LAD/ SVG TO OM2/  SVG TO DIAGONAL / SVG TO PDA & PLA  . CYSTOSCOPY WITH BIOPSY N/A 12/25/2012   Procedure: CYSTOSCOPY WITH BLADDER BIOPSY;  Surgeon: Fredricka Bonine, MD;  Location: [facility];  Service: Urology;  Laterality: N/A;  . ELECTROPHYSIOLOGY STUDY  07-27-2005  DR Carleene Overlie TAYLOR   MAPPING --   RESULT NONINDUCIBLE VT OR SVT/  DX RIGHT VENTRICULAR OUTFLOW TRACT VT AND RULES OUT MORE MALIGNANT CAUSES OF VT  . INGUINAL HERNIA REPAIR Bilateral [place]  . KNEE  ARTHROSCOPY Right 1994  . LUMBAR DISC SURGERY  1999   L4 -- L5  . REMOVAL VOCAL CORD POLYPS  1978  . TONSILLECTOMY  AS CHILD  . TRANSTHORACIC ECHOCARDIOGRAM  08-08-2011   MILD LVH/  EF [id number]  GRADE I DIASTOLIC DYSFUNCTION/ MILD AV STENOSIS    Current Outpatient Prescriptions  Medication Sig Dispense Refill  . acetaminophen (TYLENOL) 325 MG tablet Take 650 mg by mouth once as needed for moderate pain or headache.    Marland Kitchen amiodarone (PACERONE) 200 MG tablet Take 200 mg by mouth daily.     . butalbital-acetaminophen-caffeine (FIORICET, ESGIC) 50-325-40 MG per tablet Take 1 tablet by mouth once as needed for headache.     . cetirizine (ZYRTEC) 10 MG tablet Take 10 mg by mouth daily.    . cholecalciferol (VITAMIN D) 1000 UNITS tablet Take 1,000 Units by mouth daily.    . hydrochlorothiazide (HYDRODIURIL) 25 MG tablet TAKE 1 TABLET (25 MG TOTAL) BY MOUTH EVERY MORNING. 90 tablet 2  . Polyethyl Glycol-Propyl Glycol (SYSTANE ULTRA OP) Apply 1-2 drops to eye once as needed (dry eyes.).     Marland Kitchen potassium chloride (K-DUR) 10 MEQ tablet Take 10 mEq by mouth every morning. Take as directed    . pravastatin (PRAVACHOL) 80 MG tablet Take 80 mg by mouth every evening.     . warfarin (COUMADIN) 5 MG tablet PATIENT TAKES 1.5 MG (1 1/2 TABLET) BY MOUTH DAILY (Patient taking differently: Take 7.5 mg by mouth daily. PATIENT TAKES 7.5 MG (1 1/2 TABLET) BY MOUTH DAILY.) 50 tablet 3   No current facility-administered medications for this encounter.     Allergies  Allergen Reactions  . Ace Inhibitors Other (See Comments)    COUGH  . Hycodan [Hydrocodone-Homatropine] Nausea And Vomiting  . Oxycodone Nausea Only    Pt states that he cannot take prescription pain medication  . Zocor [Simvastatin] Other (See Comments)    MYALGIA  . Cephalexin Rash    Social History   Social History  . Marital status: Married    Spouse name: N/A  . Number of children: 3  . Years of education: N/A   Occupational History    . Retired    Social History Main Topics  . Smoking status: Former Smoker    Packs/day: 1.00    Years: 25.00    Types: Cigarettes    Quit date: 04/25/1980  . Smokeless tobacco: Never Used  . Alcohol use No  . Drug use: No  . Sexual activity: Not on file   Other Topics Concern  . Not on file   Social History Narrative   [employer]56-'58, overseas to [place]  Some care through [place]   No service disability    Family History  Problem Relation Age of Onset  . Cancer Mother     ROS- All systems are reviewed and negative except as per the HPI above  Physical Exam: Vitals:   02/23/16 1056  BP: (!) 146/86  Pulse: 64  Weight: 209 lb (94.8 kg)  Height: 5\' 10"  (1.778 m)    GEN- The patient is well appearing, alert and oriented x 3 today.   Head- normocephalic, atraumatic Eyes-  Sclera clear, conjunctiva pink Ears- hearing intact Oropharynx- clear Neck- supple, no JVP Lymph- no cervical lymphadenopathy Lungs- Clear to ausculation bilaterally, normal work of breathing Heart- Regular rate and rhythm, no murmurs, rubs or gallops, PMI not laterally displaced GI- soft, NT, ND, + BS Extremities- no clubbing, cyanosis, or edema MS- no significant deformity or atrophy Skin- no rash or lesion Psych- euthymic mood, full affect Neuro- strength and sensation are intact  EKG-SR with pvc's, pr int 156 ms, qrs int 128 ms, qtc 505 ms  Epic records reviewed  Echo-2D ECHO2/16 with mild AS and mean gradient 17 mmHg. No symptoms to suggest worsening AS. Suggested  2D ECHO as an outpatient.   Assessment and Plan: 1. PAF Back in SR Continue amiodarone at 200 mg a day Continue warfarin for chadsvasc score of at least 3 Has concerns re long term use of amiodarone and discussed options of ablation/tikosyn Has appointment with [facility]/Dr. Caryl Comes 11/27 and will discuss further then  2. Syncope Pt declined LinQ monitor in hospital, further discussed with pt and he will think about it Echo needs  to be updated when seen at [facility]  3. Aortic stenosis Echo to be updated  F/u [facility] 11/27 afib clinic as needed

## 2016-03-03 ENCOUNTER — Ambulatory Visit (INDEPENDENT_AMBULATORY_CARE_PROVIDER_SITE_OTHER): Payer: Medicare Other | Admitting: Family Medicine

## 2016-03-03 ENCOUNTER — Encounter: Payer: Self-pay | Admitting: Family Medicine

## 2016-03-03 DIAGNOSIS — R55 Syncope and collapse: Secondary | ICD-10-CM | POA: Diagnosis not present

## 2016-03-03 DIAGNOSIS — I2510 Atherosclerotic heart disease of native coronary artery without angina pectoris: Secondary | ICD-10-CM

## 2016-03-03 NOTE — Patient Instructions (Signed)
I think it would be prudent to have you see cardiology before we changed any of your meds here.  Take care.  Glad to see you.

## 2016-03-03 NOTE — Progress Notes (Signed)
Syncope w/u per cardiology.  I'll defer to cardiology. In the last week, he has had flushed feeling in the head.  He doesn't have presyncope or vertigo but has episodic unsteadiness with standing.  This isn't consistent.    He has a chronic cough, he attributed to post nasal gtt.  Some occ sputum that he can clear out of the throat.  Will feel sensation of motion after coughing hard but not true vertigo.      CT head neg w/o acute changes as inpatient.  Meds, vitals, and allergies reviewed.   ROS: Per HPI unless specifically indicated in ROS section   nad Tm wnl Nasal exam slightly stuffy, no erythema OP wnl MMM Neck supple, no LA IRR, not tachy.  ctab abd soft Ext w/o edema.

## 2016-03-03 NOTE — Progress Notes (Signed)
Pre visit review using our clinic review tool, if applicable. No additional management support is needed unless otherwise documented below in the visit note. 

## 2016-03-04 NOTE — Assessment & Plan Note (Signed)
History of syncope noted. He previously refused implanted loop recorder. Discussed with patient about options. He has had a chronic cough previously. I do not think it is good idea to change his medications to potentially address the cough until he is had cardiac workup about his syncope. At this point he is not presyncopal. He is noted to have been a regular rate and rhythm here at the clinic. He is not tachycardic. Appears to be okay for outpatient follow-up. I need him to see cardiology first and then we can go from there, depending on cardiology recommendations. I will route this to cardiology for input, to see if his appointment can be moved up. Appreciate help all involved.

## 2016-03-04 NOTE — Telephone Encounter (Signed)
Appt with Tommye Standard, PA on 11/27.

## 2016-03-07 ENCOUNTER — Encounter: Payer: Self-pay | Admitting: Physician Assistant

## 2016-03-09 NOTE — Progress Notes (Signed)
Cardiology Office Note Date:  03/10/2016  Patient ID:  [name] [name], [name] [name] 1934/05/10, MRN [id number] PCP:  Elsie Stain, MD  Cardiologist:  Dr. Caryl Comes   Chief Complaint: discuss AF therapy options, syncope  History of Present Illness: [name] [name] is a 81 y.o. male with history of CAD (remote CABG), mild AS, VT (RVOT tx with amiodarone), PAFib, comes in today to be seen for Dr. Caryl Comes.  He was most recently seen at the AFib clinic after a hospital stay 2/2 a syncopal event.  Upon arrival to the ER he was noted to be in AFib.  The etiology of his syncope was unclear whether this was a vagal event related to urinating but he has had other episodes that weren't related to urination. He denies any prodrome or confusion afterwards. He is feeling quite well now and eager to home. Plan was for 2D ECHO and LOOP recorder placement given his infrequent recurrent syncope, LBBB and PAF with recurrence during this admission. He declined LOOP recorder placement and requested to be discharge home.  He saw Roderic Palau at the AFib clinic, was in [place] at that time w/PVCs, she noted his concern about remaining on amiodarone and discussed changing to [facility] and/or ablation.  He ad previously spoken to Dr. Rayann Heman about ablation years ago but wanted to hold off at that time.  It was decided that he would follow up in the office to discuss further.  He is feeling well today thinks he may be in AF given feeling some extra/skipped beats when checking his pulse, no perception of palpitations or symptoms at all otherwise.  He tells me the night he fainted he got up about 3AM to urinate (this is typical for him), he was standing urinating, and then found himself on his knees.  He wasn't certain what had happened.  He noted the front of his PJ damp but didn't think much of it, got up felt a little weak and went to bed.  He mentioned it to his wife in the morning and called was referred to the ER.  He has fainted  once before, about 5 years ago and again was standing urinating.  At that time he thinks he mentioned it to his PMD perhaps his urologist and was told he should sit to urinate to avoid this.  He has had a near syncopal event twice, once about 5-6 years ago after getting up in the morning was washing his face felt weak, lightheaded returned to bed to lay down and it passed.  The other about a year ago was out doing yard work , hot, not drinking much water a lot of bending and standing picking up yard debris, stopped to talk with a neighbor and became lightheaded, weak, was helped to sit.  Eventually with persistent feelings of weakness EMS was called, reportedly [facility]SS recomened to transport but declined.  He denies with or without these episodes any kind of CP, SOB.  He will occassionally feel a little lightheaded that is fleeting not frequently and is very fleeting that he has never paid much attention to.  He feels he may have AF 1 time a year perhaps when he feels like his has palpitations/sensation in the base of his throat. He is nearly constantly checking his pulse and often feels a couple extra/quick beats and right back to normal or even skipped or missed beat then back to normal, though without any sensation of palpitations.  Like today, though he feels his  pulse more irregular today then usual.   He mentions his cough, he also seems to have chronic sinus congestion, reports seeing a pulmonologist a couple years ago without abnormal findings. No SOB, rarely productive and states seems like it is mucus at the back of his throat.  Cardiac catheterization 2007 demonstrated patent grafts  02/12/16: TTE Study Conclusions - Left ventricle: The cavity size was normal. Wall thickness was   increased in a pattern of mild LVH. Systolic function was normal.   The estimated ejection fraction was in the range of 60% to 65%.   Wall motion was normal; there were no regional wall motion   abnormalities. The  study is not technically sufficient to allow   evaluation of LV diastolic function. - Aortic valve: Valve mobility was restricted. There was mild to   moderate stenosis. There was mild regurgitation. Valve area   (VTI): 1.5 cm^2. Valve area (Vmax): 1.33 cm^2. Valve area   (Vmean): 1.35 cm^2. - Right ventricle: Systolic function was reduced.   AF history: Initial diagnosis 1982 Amiodarone +/- 7 years (also with h/o RVOT VT) He has had a chronic cough.  Amio surveillance: 01/2016, LFTs wnl, Feb 2017 TSH wnl, last PFTs 2015 DCLO 27.52, 83% predicted (2013 was 28.09 and 77% predicted, 03/2011 29.53 and 87% predicted)  Past Medical History:  Diagnosis Date  . Anticoagulated on Coumadin    followed in CVRR at [facility]  . Aortic stenosis    a. Echo 2/15: Severe LVH, EF 55-60%, Gr 1 DD, mild to mod AS (mean 20 mmHg, peak 31 mmHg), AVA 1.4-1.5 cm2, mild AI, MAC, trivial MR, LA upper limits of normal, RVSP 22 mmHg, mild RAE;   b. Echo 2/16:  EF 55-60%, no RWMA, mild AS (mean 17 mmHg, peak 27 mmHg), AVA 1.5 cm2, mild AI, MAC, mild LAE  . Arthritis   . Bladder neoplasm   . Chronic cough    NO CARDIAC OR PULMONARY RELATED  . Coronary artery disease CARDIOLOGIST-  DR KLEIN/ [facility]   a. s/p CABG 2004, b. LHC with patent grafts 07/2005, ejection fraction 50%;  c. Myoview 2/15: no ischemia, EF 61%  . Dry eyes   . Dyslipidemia   . GERD (gastroesophageal reflux disease)   . HTN (hypertension)   . Mild aortic stenosis   . PAF (paroxysmal atrial fibrillation) ([place])    CHADS2-VASc:  4  . Paroxysmal atrial fibrillation (HCC)   . RVOT-VT (right ventricular outflow tract ventricular tachycardia) ([place])    a. Amiodarone Rx  . S/P CABG x 5 2004    Past Surgical History:  Procedure Laterality Date  . CARDIAC CATHETERIZATION  07-26-2005  DR GAMBLE   PRESERVED LVF/  EF 50%/ PATENT GRAFTS  . CARDIAC CATHETERIZATION  03-04-2003  DR GAMBLE   SEVERE 3 VESSEL DISEASE  . [place]   PAF/ FALSE POSITIVE STRESS TEST  . CATARACT EXTRACTION W/ INTRAOCULAR LENS IMPLANT Right   . CORONARY ARTERY BYPASS GRAFT  03-08-2003  DR [id number]   LIMA TO LAD/ SVG TO OM2/  SVG TO DIAGONAL / SVG TO PDA & PLA  . CYSTOSCOPY WITH BIOPSY N/A 12/25/2012   Procedure: CYSTOSCOPY WITH BLADDER BIOPSY;  Surgeon: Fredricka Bonine, MD;  Location: [facility];  Service: Urology;  Laterality: N/A;  . ELECTROPHYSIOLOGY STUDY  07-27-2005  DR Carleene Overlie [place]   MAPPING --   RESULT NONINDUCIBLE VT OR SVT/  DX RIGHT VENTRICULAR OUTFLOW TRACT VT AND  RULES OUT MORE MALIGNANT CAUSES OF VT  . INGUINAL HERNIA REPAIR Bilateral [place]  . KNEE ARTHROSCOPY Right 1994  . LUMBAR DISC SURGERY  1999   L4 -- L5  . REMOVAL VOCAL CORD POLYPS  1978  . TONSILLECTOMY  AS CHILD  . TRANSTHORACIC ECHOCARDIOGRAM  08-08-2011   MILD LVH/  EF [id number]  GRADE I DIASTOLIC DYSFUNCTION/ MILD AV STENOSIS    Current Outpatient Prescriptions  Medication Sig Dispense Refill  . acetaminophen (TYLENOL) 325 MG tablet Take 650 mg by mouth once as needed for moderate pain or headache.    Marland Kitchen amiodarone (PACERONE) 200 MG tablet Take 100 mg by mouth daily.     . butalbital-acetaminophen-caffeine (FIORICET, ESGIC) 50-325-40 MG per tablet Take 1 tablet by mouth once as needed for headache.     . cetirizine (ZYRTEC) 10 MG tablet Take 10 mg by mouth daily.    . cholecalciferol (VITAMIN D) 1000 UNITS tablet Take 1,000 Units by mouth daily.    . hydrochlorothiazide (HYDRODIURIL) 25 MG tablet TAKE 1 TABLET (25 MG TOTAL) BY MOUTH EVERY MORNING. 90 tablet 2  . Polyethyl Glycol-Propyl Glycol (SYSTANE ULTRA OP) Apply 1-2 drops to eye once as needed (dry eyes.).     Marland Kitchen potassium chloride (K-DUR) 10 MEQ tablet Take 10 mEq by mouth every morning. Take as directed    . pravastatin (PRAVACHOL) 80 MG tablet Take 80 mg by mouth every evening.     . warfarin (COUMADIN) 5 MG tablet PATIENT TAKES 1.5 MG (1 1/2 TABLET) BY MOUTH  DAILY (Patient taking differently: Take 7.5 mg by mouth daily. PATIENT TAKES 7.5 MG (1 1/2 TABLET) BY MOUTH DAILY.) 50 tablet 3   No current facility-administered medications for this visit.     Allergies:   Ace inhibitors; Hycodan [hydrocodone-homatropine]; Oxycodone; Zocor [simvastatin]; and Cephalexin   Social History:  The patient  reports that he quit smoking about 35 years ago. His smoking use included Cigarettes. He has a 25.00 pack-year smoking history. He has never used smokeless tobacco. He reports that he does not drink alcohol or use drugs.   Family History:  The patient's family history includes Cancer in his mother.*  ROS:  Please see the history of present illness.    All other systems are reviewed and otherwise negative.   PHYSICAL EXAM:  VS:  BP (!) 148/90   Pulse 84   Ht 5\' 10"  (1.778 m)   Wt 206 lb (93.4 kg)   BMI 29.56 kg/m  BMI: Body mass index is 29.56 kg/m. Well nourished, well developed, in no acute distress  HEENT: normocephalic, atraumatic  Neck: no JVD, carotid bruits or masses Cardiac:  IRRR no significant murmurs, no rubs, or gallops Lungs:  clear to auscultation bilaterally, no wheezing, rhonchi or rales  Abd: soft, nontender, MS: no deformity or atrophy Ext: no edema, palp pedal pulses b/l Skin: warm and dry, no rash Neuro:  No gross deficits appreciated Psych: euthymic mood, full affect     EKG:  Done today and reviewed by myself is AFib, RBBB, PVCs, 84bpm, QTc 575ms, QRS 143ms Done 02/23/16 reviewed today by myself showed SR, IVCD, PVC, QTc 469ms, PR 165ms, QRS 120ms  Recent Labs: 06/11/2015: TSH 1.66 02/11/2016: ALT 17 02/12/2016: BUN 12; Creatinine, Ser 0.99; Hemoglobin 14.2; Platelets 198; Potassium 3.8; Sodium 139  No results found for requested labs within last 8760 hours.   CrCl cannot be calculated (Patient's most recent lab result is older than the maximum 21  days allowed.).   Wt Readings from Last 3 Encounters:  03/10/16 206 lb  (93.4 kg)  03/03/16 204 lb 8 oz (92.8 kg)  02/23/16 209 lb (94.8 kg)     Other studies reviewed: Additional studies/records reviewed today include: summarized above  ASSESSMENT AND PLAN:  1. Syncope     Uncertain etiology     vagal? micturition syncope? Can not exclude rhythm or rate as an etiology     He is recommended to sit when urinating  We discussed at length ILR implant, indication given syncope, PAF, hx of RVOT VT, risks/benefits, monitoring.  He is uncertain and would like to discuss further with Dr. Caryl Comes.  He is made aware of [place] law, no driving for 6 months  2. PAFib     CHA2DS2Vasc is at least 4, on warfarin, monitored and managed with the coumadin clinic     He is AF today and asymptomatic.       Will get an INR done today given his last was slightly sub-therpaeutic at 1.93     QTc in SR is likely too long to initiate Tikosyn, though he is quite interested in this given he has significant concerns about being on amiodarone going forward and possible side effects     We discussed his surveillance testing as of yet have been stable     We discussed 3 day hospital stay, QTc monitoring and potential side effects.  He would like to give this more though as well and have an opportunity to see Dr. Caryl Comes to discuss further as well.     Also discussed re-visiting with Dr. Rayann Heman AF ablation tx  He is really asymptomatic from an AF standpoint, denies exertional intolerances, rare palpitations. Suspect AF was coincidental to his syncopal event with presenting HR 102bpm  3. CAD     stable without symptoms  4. HTN      A little high today, was better at his visit in the AF clinic, monitor  5. VHD, mild-mod AS     monitor w/ echos   Disposition: After much discussion he remains very undecided in the direction he would like to go with his AF therapy.  No changes are being made at this time.  He would very much like to sit with Dr. Caryl Comes to discuss both the syncopal event  +/- loop as well as AF treatment options.   Will have him scheduled to see Dr. Caryl Comes in the next month, next available appointment, to return sooner if needed  Current medicines are reviewed at length with the patient today.   Haywood Lasso, PA-C 03/10/2016 12:33 PM     [address] [place] [address] [phone] (office)  [phone] (fax)

## 2016-03-10 ENCOUNTER — Ambulatory Visit (INDEPENDENT_AMBULATORY_CARE_PROVIDER_SITE_OTHER): Payer: Medicare Other | Admitting: Physician Assistant

## 2016-03-10 ENCOUNTER — Ambulatory Visit (INDEPENDENT_AMBULATORY_CARE_PROVIDER_SITE_OTHER): Payer: Medicare Other | Admitting: *Deleted

## 2016-03-10 VITALS — BP 148/90 | HR 84 | Ht 70.0 in | Wt 206.0 lb

## 2016-03-10 DIAGNOSIS — Z79899 Other long term (current) drug therapy: Secondary | ICD-10-CM | POA: Diagnosis not present

## 2016-03-10 DIAGNOSIS — I10 Essential (primary) hypertension: Secondary | ICD-10-CM

## 2016-03-10 DIAGNOSIS — R55 Syncope and collapse: Secondary | ICD-10-CM

## 2016-03-10 DIAGNOSIS — Z7901 Long term (current) use of anticoagulants: Secondary | ICD-10-CM | POA: Diagnosis not present

## 2016-03-10 DIAGNOSIS — I4891 Unspecified atrial fibrillation: Secondary | ICD-10-CM | POA: Diagnosis not present

## 2016-03-10 DIAGNOSIS — I480 Paroxysmal atrial fibrillation: Secondary | ICD-10-CM

## 2016-03-10 DIAGNOSIS — R002 Palpitations: Secondary | ICD-10-CM | POA: Diagnosis not present

## 2016-03-10 DIAGNOSIS — I2510 Atherosclerotic heart disease of native coronary artery without angina pectoris: Secondary | ICD-10-CM

## 2016-03-10 LAB — POCT INR: INR: 3

## 2016-03-10 NOTE — Patient Instructions (Signed)
Medication Instructions:   Your physician recommends that you continue on your current medications as directed. Please refer to the Current Medication list given to you today.   If you need a refill on your cardiac medications before your next appointment, please call your pharmacy.  Labwork: PT INR    Testing/Procedures: NONE ORDERED  TODAY    Follow-Up: 1 MONTH DR Caryl Comes NEXT AVAILABLE ASAP    Any Other Special Instructions Will Be Listed Below (If Applicable).

## 2016-03-15 ENCOUNTER — Other Ambulatory Visit: Payer: Self-pay | Admitting: Dermatology

## 2016-03-15 DIAGNOSIS — C4441 Basal cell carcinoma of skin of scalp and neck: Secondary | ICD-10-CM | POA: Diagnosis not present

## 2016-03-15 DIAGNOSIS — L570 Actinic keratosis: Secondary | ICD-10-CM | POA: Diagnosis not present

## 2016-03-15 DIAGNOSIS — Z85828 Personal history of other malignant neoplasm of skin: Secondary | ICD-10-CM | POA: Diagnosis not present

## 2016-03-15 DIAGNOSIS — C44712 Basal cell carcinoma of skin of right lower limb, including hip: Secondary | ICD-10-CM | POA: Diagnosis not present

## 2016-03-21 ENCOUNTER — Encounter: Payer: Self-pay | Admitting: Internal Medicine

## 2016-03-21 ENCOUNTER — Ambulatory Visit: Payer: Medicare Other | Admitting: Physician Assistant

## 2016-03-22 ENCOUNTER — Encounter: Payer: Self-pay | Admitting: Internal Medicine

## 2016-03-22 ENCOUNTER — Ambulatory Visit (INDEPENDENT_AMBULATORY_CARE_PROVIDER_SITE_OTHER): Payer: Medicare Other | Admitting: Internal Medicine

## 2016-03-22 VITALS — BP 122/70 | HR 68 | Ht 70.0 in | Wt 206.0 lb

## 2016-03-22 DIAGNOSIS — I259 Chronic ischemic heart disease, unspecified: Secondary | ICD-10-CM

## 2016-03-22 DIAGNOSIS — Z79899 Other long term (current) drug therapy: Secondary | ICD-10-CM | POA: Diagnosis not present

## 2016-03-22 DIAGNOSIS — I480 Paroxysmal atrial fibrillation: Secondary | ICD-10-CM | POA: Diagnosis not present

## 2016-03-22 LAB — TSH: TSH: 3.63 m[IU]/L (ref 0.40–4.50)

## 2016-03-22 NOTE — Patient Instructions (Addendum)
Medication Instructions: - Your physician recommends that you continue on your current medications as directed. Please refer to the Current Medication list given to you today.   Labwork: - TODAY : TSH/ LIver  Procedures/Testing: - Your physician has recommended that you have a pulmonary function test. Pulmonary Function Tests are a group of tests that measure how well air moves in and out of your lungs.  Follow-Up: - Your physician wants you to follow-up in: 6 months with Dr. Caryl Comes. You will receive a reminder letter in the mail two months in advance. If you don't receive a letter, please call our office to schedule the follow-up appointment.  Any Additional Special Instructions Will Be Listed Below (If Applicable).     If you need a refill on your cardiac medications before your next appointment, please call your pharmacy.  Your physician recommends that you schedule a follow-up appointment i

## 2016-03-22 NOTE — Progress Notes (Signed)
Patient Care Team: Tonia Ghent, MD as PCP - General (Family Medicine)   HPI  [name] [name] is a 81 y.o. male Seen in followup for atrial fibrillation and VT been treated with amiodarone. Thromboembolic risk factors are notable for a chads vas score of 3   He underwent monitoring after initial visit to clarify the presence of atrial fibrillation.This prompted reinitiation of his warfarin and his amiodarone. He had  seen Dr. Rayann Heman to consider catheter ablation but decided that he would continue on amiodarone.  He has been on it for some time  He has a history of ischemic heart disease with prior bypass surgery 2004 Catheterization 2007 demonstrated patent grafts  He has had a problem with a cough. His ARB was changed from cozaaar >> benicar last year.    Pulmonary function testing last done 2015 had a DLCO of 83 percent   Echocardiogram 2/16 demonstrated mild aortic stenosis with a mean gradient of 17 ; ejection fraction was 55-60%.  He has had interval syncope  upon evaluation in the emergency room 10/17 he was noted to be in atrial fibrillation  . It was recommended that he consider loop recorder. He was not sure.  He was also noted on one of his syncopal episodes and arrival to the emergency room to be in atrial fibrillation. It is unclear how this was related to his syncope.  His 2 syncopal episodes of both occurred following urination.  He has some orthostatic lightheadedness and some post micturition lightheadedness.  There was residual orthostatic intolerance and the duration of the event was quite long i.e. measured in minutes.  his wife noted that he had been outside in the yard working in the heat that day and thought he was quite dehydrated anyway.  He expresses concerns about long-term amiodarone toxicities, although he denies cough, some sensitivity, GI symptoms and surveillance laboratories have been okay       Past Medical History:  Diagnosis Date  .  Anticoagulated on Coumadin    [facility] at [facility]  . Aortic stenosis    a. Echo 2/15: Severe LVH, EF 55-60%, Gr 1 DD, mild to mod AS (mean 20 mmHg, peak 31 mmHg), AVA 1.4-1.5 cm2, mild AI, MAC, trivial MR, LA upper limits of normal, RVSP 22 mmHg, mild RAE;   b. Echo 2/16:  EF 55-60%, no RWMA, mild AS (mean 17 mmHg, peak 27 mmHg), AVA 1.5 cm2, mild AI, MAC, mild LAE  . Arthritis   . Bladder neoplasm   . Chronic cough    NO CARDIAC OR PULMONARY RELATED  . Coronary artery disease CARDIOLOGIST-  DR Dajuana Palen/ [facility]   a. s/p CABG 2004, b. LHC with patent grafts 07/2005, ejection fraction 50%;  c. Myoview 2/15: no ischemia, EF 61%  . Dry eyes   . Dyslipidemia   . GERD (gastroesophageal reflux disease)   . HTN (hypertension)   . Mild aortic stenosis   . PAF (paroxysmal atrial fibrillation) ([place])    CHADS2-VASc:  4  . Paroxysmal atrial fibrillation (HCC)   . RVOT-VT (right ventricular outflow tract ventricular tachycardia) ([place])    a. Amiodarone Rx  . S/P CABG x 5 2004    Past Surgical History:  Procedure Laterality Date  . CARDIAC CATHETERIZATION  07-26-2005  DR GAMBLE   PRESERVED LVF/  EF 50%/ PATENT GRAFTS  . CARDIAC CATHETERIZATION  03-04-2003  DR GAMBLE   SEVERE 3 VESSEL DISEASE  . CARDIAC CATHETERIZATION  [place]   PAF/ FALSE POSITIVE STRESS TEST  . CATARACT EXTRACTION W/ INTRAOCULAR LENS IMPLANT Right   . CORONARY ARTERY BYPASS GRAFT  03-08-2003  DR [id number]   LIMA TO LAD/ SVG TO OM2/  SVG TO DIAGONAL / SVG TO PDA & PLA  . CYSTOSCOPY WITH BIOPSY N/A 12/25/2012   Procedure: CYSTOSCOPY WITH BLADDER BIOPSY;  Surgeon: Fredricka Bonine, MD;  Location: [facility];  Service: Urology;  Laterality: N/A;  . ELECTROPHYSIOLOGY STUDY  07-27-2005  DR Carleene Overlie [place]   MAPPING --   RESULT NONINDUCIBLE VT OR SVT/  DX RIGHT VENTRICULAR OUTFLOW TRACT VT AND RULES OUT MORE MALIGNANT CAUSES OF VT  . INGUINAL HERNIA REPAIR Bilateral [place]  . KNEE  ARTHROSCOPY Right 1994  . LUMBAR DISC SURGERY  1999   L4 -- L5  . REMOVAL VOCAL CORD POLYPS  1978  . TONSILLECTOMY  AS CHILD  . TRANSTHORACIC ECHOCARDIOGRAM  08-08-2011   MILD LVH/  EF [id number]  GRADE I DIASTOLIC DYSFUNCTION/ MILD AV STENOSIS    Current Outpatient Prescriptions  Medication Sig Dispense Refill  . acetaminophen (TYLENOL) 325 MG tablet Take 650 mg by mouth once as needed for moderate pain or headache.    Marland Kitchen amiodarone (PACERONE) 200 MG tablet Take 100 mg by mouth daily.     . butalbital-acetaminophen-caffeine (FIORICET, ESGIC) 50-325-40 MG per tablet Take 1 tablet by mouth once as needed for headache.     . cetirizine (ZYRTEC) 10 MG tablet Take 10 mg by mouth daily.    . cholecalciferol (VITAMIN D) 1000 UNITS tablet Take 1,000 Units by mouth daily.    . hydrochlorothiazide (HYDRODIURIL) 25 MG tablet TAKE 1 TABLET (25 MG TOTAL) BY MOUTH EVERY MORNING. 90 tablet 2  . Polyethyl Glycol-Propyl Glycol (SYSTANE ULTRA OP) Apply 1-2 drops to eye once as needed (dry eyes.).     Marland Kitchen potassium chloride (K-DUR) 10 MEQ tablet Take 10 mEq by mouth every morning. Take as directed    . pravastatin (PRAVACHOL) 80 MG tablet Take 80 mg by mouth every evening.     . warfarin (COUMADIN) 5 MG tablet PATIENT TAKES 1.5 MG (1 1/2 TABLET) BY MOUTH DAILY (Patient taking differently: Take 7.5 mg by mouth daily. PATIENT TAKES 7.5 MG (1 1/2 TABLET) BY MOUTH DAILY.) 50 tablet 3   No current facility-administered medications for this visit.     Allergies  Allergen Reactions  . Ace Inhibitors Other (See Comments)    COUGH  . Hycodan [Hydrocodone-Homatropine] Nausea And Vomiting  . Oxycodone Nausea Only    Pt states that he cannot take prescription pain medication  . Zocor [Simvastatin] Other (See Comments)    MYALGIA  . Cephalexin Rash    Review of Systems negative except from HPI and PMH  Physical Exam BP 122/70   Pulse 68   Ht 5\' 10"  (1.778 m)   Wt 206 lb (93.4 kg)   SpO2 97%   BMI 29.56 kg/m   Well developed and nourished in no acute distress HENT normal Neck supple with JVP-flat; carotids brisk  Clear Regular rate and rhythm, 2/6 systolic  Abd-soft with active BS No Clubbing cyanosis edema Skin-warm and dry A & Oriented  Grossly normal sensory and motor function  ECG was ordered today demonstrated sinus rhythm at  64 15/12/47  QR lead III and F consistent with an inferior wall MI with possible posterior extension  RSR prime   not appreciably different from 2015  Assessment and  Plan  Aortic stenosis  Mild  Atrial fibrillation-Paroxysmal   Hypertension  Ischemic heart disease  S/p CABG  With near normal LV function  syncope   Without symptoms of ischemia  Atrial fibrillation is infrequent.    Given his concerns about amiodarone, we will repeat his pulmonary function tests.  We will also check his amiodarone surveillance laboratories.  His syncope seems most consistent with post micturition events.  We discussed the importance of volume repletion being cognizant of the prodrome and to consider urinating sitting down and sustaining on the commode for 30 seconds to a minute following bladder emptying.  More than 50% of 45 min was spent in counseling related to the above

## 2016-03-23 LAB — HEPATIC FUNCTION PANEL
ALBUMIN: 4.2 g/dL (ref 3.6–5.1)
ALK PHOS: 58 U/L (ref 40–115)
ALT: 11 U/L (ref 9–46)
AST: 21 U/L (ref 10–35)
Bilirubin, Direct: 0.1 mg/dL (ref <=0.2)
Indirect Bilirubin: 0.3 mg/dL (ref 0.2–1.2)
TOTAL PROTEIN: 6.8 g/dL (ref 6.1–8.1)
Total Bilirubin: 0.4 mg/dL (ref 0.2–1.2)

## 2016-03-28 ENCOUNTER — Ambulatory Visit: Payer: Medicare Other

## 2016-04-26 ENCOUNTER — Ambulatory Visit (INDEPENDENT_AMBULATORY_CARE_PROVIDER_SITE_OTHER): Payer: Medicare Other | Admitting: *Deleted

## 2016-04-26 DIAGNOSIS — I480 Paroxysmal atrial fibrillation: Secondary | ICD-10-CM | POA: Diagnosis not present

## 2016-04-26 LAB — POCT INR: INR: 2.1

## 2016-04-27 ENCOUNTER — Telehealth: Payer: Self-pay | Admitting: Internal Medicine

## 2016-04-27 NOTE — Telephone Encounter (Signed)
Did you speak to him about switching to a NOAC? If so - Eliquis 5 mg BID?

## 2016-04-27 NOTE — Telephone Encounter (Signed)
°  [place]   Pt has some questions regarding Eliquis. Inquiring about dosage he would need prior to speaking to [facility] for approval. Please call.

## 2016-04-28 NOTE — Telephone Encounter (Signed)
Almost certainly we spoke of NOAC as alternative to warfarin My preference would be apixoban and the dose would be 5 mg bid Is he going to get from [place]?

## 2016-04-29 MED ORDER — APIXABAN 5 MG PO TABS: 5.0000 mg | ORAL_TABLET | Freq: Two times a day (BID) | ORAL | 3 refills | Status: DC

## 2016-04-29 NOTE — Telephone Encounter (Signed)
I called and spoke with the patient. He is aware that Dr. Caryl Comes has recommended Eliquis 5 mg BID. I advised him I will mail a RX to him early next week to take to the [place]. He is agreeable. Mailing address confirmed.

## 2016-05-16 ENCOUNTER — Telehealth: Payer: Self-pay | Admitting: Internal Medicine

## 2016-05-16 ENCOUNTER — Telehealth: Payer: Self-pay | Admitting: *Deleted

## 2016-05-16 ENCOUNTER — Other Ambulatory Visit: Payer: Self-pay | Admitting: Internal Medicine

## 2016-05-16 DIAGNOSIS — I480 Paroxysmal atrial fibrillation: Secondary | ICD-10-CM

## 2016-05-16 NOTE — Telephone Encounter (Signed)
Pt states he received correspondence form Dr Sherral Hammers his [place] PCP. Pt states [facility] requires a CBC and renal function, in addition to medical records from Dr Caryl Comes,  before Eliquis will be approved.   Pt states he mailed information to [facility] 05/06/16 that includes correspondence from [place] about information they need from Dr Caryl Comes before [place] will approve Eliquis.   Pt advised I will ask Nira Conn to follow up with him tomorrow about approval for Eliquis from [place].

## 2016-05-16 NOTE — Telephone Encounter (Signed)
Follow UP    Pt seen Dr Caryl Comes in October, and he wanted to change Rx from warafrin to Eliquis, the [facility] needs documentation on this please call

## 2016-05-16 NOTE — Telephone Encounter (Signed)
Spoke with pt after refilling Warfarin and saw on med list that Eliquis was on med list This nurse called pt and he states he has not started Eliquis as he is in process of getting approval at [place] so Eliquis removed from med list at present

## 2016-05-17 ENCOUNTER — Other Ambulatory Visit: Payer: Self-pay | Admitting: Dermatology

## 2016-05-17 DIAGNOSIS — L905 Scar conditions and fibrosis of skin: Secondary | ICD-10-CM | POA: Diagnosis not present

## 2016-05-17 DIAGNOSIS — C44519 Basal cell carcinoma of skin of other part of trunk: Secondary | ICD-10-CM | POA: Diagnosis not present

## 2016-05-17 DIAGNOSIS — Z85828 Personal history of other malignant neoplasm of skin: Secondary | ICD-10-CM | POA: Diagnosis not present

## 2016-05-18 ENCOUNTER — Ambulatory Visit (INDEPENDENT_AMBULATORY_CARE_PROVIDER_SITE_OTHER): Payer: Medicare Other | Admitting: Internal Medicine

## 2016-05-18 DIAGNOSIS — Z79899 Other long term (current) drug therapy: Secondary | ICD-10-CM

## 2016-05-18 LAB — PULMONARY FUNCTION TEST
DL/VA % pred: 89 %
DL/VA: 4.15 ml/min/mmHg/L
DLCO COR % PRED: 74 %
DLCO cor: 24.43 ml/min/mmHg
DLCO unc % pred: 74 %
DLCO unc: 24.57 ml/min/mmHg
FEF 25-75 Post: 4.18 L/sec
FEF 25-75 Pre: 3.06 L/sec
FEF2575-%Change-Post: 36 %
FEF2575-%PRED-POST: 204 %
FEF2575-%Pred-Pre: 150 %
FEV1-%CHANGE-POST: 6 %
FEV1-%Pred-Post: 113 %
FEV1-%Pred-Pre: 106 %
FEV1-Post: 3.34 L
FEV1-Pre: 3.14 L
FEV1FVC-%Change-Post: 7 %
FEV1FVC-%PRED-PRE: 110 %
FEV6-%Change-Post: 2 %
FEV6-%PRED-PRE: 98 %
FEV6-%Pred-Post: 100 %
FEV6-POST: 3.9 L
FEV6-PRE: 3.79 L
FEV6FVC-%Change-Post: 1 %
FEV6FVC-%PRED-POST: 107 %
FEV6FVC-%PRED-PRE: 105 %
FVC-%CHANGE-POST: 0 %
FVC-%PRED-POST: 94 %
FVC-%PRED-PRE: 95 %
FVC-POST: 3.9 L
FVC-PRE: 3.94 L
PRE FEV6/FVC RATIO: 98 %
Post FEV1/FVC ratio: 86 %
Post FEV6/FVC ratio: 100 %
Pre FEV1/FVC ratio: 80 %
RV % pred: 97 %
RV: 2.62 L
TLC % PRED: 92 %
TLC: 6.6 L

## 2016-05-19 NOTE — Telephone Encounter (Signed)
I called and spoke with the patient. He is aware that he will need to come in for an updated BMP/ CBC that we can then send to the [place].  He will come and do this on Monday 1/29. I advised that once results are back, I can then forward to the [place]. He did receive his RX for eliquis that I mailed to him.  He is also aware that if he transitions over to warfarin, that we will have CVRR help with that.  He voices understanding.

## 2016-05-19 NOTE — Telephone Encounter (Signed)
Follow up      Pt is calling again to see if he has had a CBC and a renal function test.  If yes, please send notes and lab results to the [place] so that he can get his eliquis filled.  Please call pt and let him know the status

## 2016-05-23 ENCOUNTER — Other Ambulatory Visit: Payer: Medicare Other | Admitting: *Deleted

## 2016-05-23 DIAGNOSIS — I480 Paroxysmal atrial fibrillation: Secondary | ICD-10-CM

## 2016-05-24 LAB — BASIC METABOLIC PANEL
BUN / CREAT RATIO: 16 (ref 10–24)
BUN: 16 mg/dL (ref 8–27)
CALCIUM: 9 mg/dL (ref 8.6–10.2)
CHLORIDE: 101 mmol/L (ref 96–106)
CO2: 29 mmol/L (ref 18–29)
Creatinine, Ser: 0.98 mg/dL (ref 0.76–1.27)
GFR calc non Af Amer: 73 mL/min/{1.73_m2} (ref >59)
GFR, EST AFRICAN AMERICAN: 84 mL/min/{1.73_m2} (ref >59)
Glucose: 112 mg/dL — ABNORMAL HIGH (ref 65–99)
POTASSIUM: 3.5 mmol/L (ref 3.5–5.2)
Sodium: 142 mmol/L (ref 134–144)

## 2016-05-24 LAB — CBC WITH DIFFERENTIAL/PLATELET
Basophils Absolute: 0 10*3/uL (ref 0.0–0.2)
Basos: 0 %
EOS (ABSOLUTE): 0 10*3/uL (ref 0.0–0.4)
Eos: 1 %
HEMOGLOBIN: 13.7 g/dL (ref 13.0–17.7)
Hematocrit: 40.6 % (ref 37.5–51.0)
Immature Grans (Abs): 0 10*3/uL (ref 0.0–0.1)
Immature Granulocytes: 0 %
LYMPHS ABS: 1.5 10*3/uL (ref 0.7–3.1)
Lymphs: 32 %
MCH: 31.6 pg (ref 26.6–33.0)
MCHC: 33.7 g/dL (ref 31.5–35.7)
MCV: 94 fL (ref 79–97)
MONOCYTES: 9 %
MONOS ABS: 0.4 10*3/uL (ref 0.1–0.9)
NEUTROS ABS: 2.6 10*3/uL (ref 1.4–7.0)
Neutrophils: 58 %
PLATELETS: 209 10*3/uL (ref 150–379)
RBC: 4.33 x10E6/uL (ref 4.14–5.80)
RDW: 14.5 % (ref 12.3–15.4)
WBC: 4.5 10*3/uL (ref 3.4–10.8)

## 2016-06-01 ENCOUNTER — Telehealth: Payer: Self-pay | Admitting: Internal Medicine

## 2016-06-01 NOTE — Telephone Encounter (Signed)
Patient states that he has been speaking to [place] regarding starting eliquis. He states that the [place] has not heard anything and that Nira Conn has all of the information needed to send over to the [place] regarding his approval for eliquis. He states that he would like for Heather to call him to let him know that everything has been done.

## 2016-06-01 NOTE — Telephone Encounter (Signed)
New message   Pt verbalized that he wants to know if rn and Dr.Klein approved his Eliquis through the [place]

## 2016-06-02 MED ORDER — APIXABAN 5 MG PO TABS: 5.0000 mg | ORAL_TABLET | Freq: Two times a day (BID) | ORAL | 3 refills | Status: DC

## 2016-06-02 NOTE — Telephone Encounter (Signed)
Faxed Dr. Olin Pia last office note/ labs/ RX for Eliquis 5 mg BID to the [place] at [phone] attn: Pam.  Confirmation received.   I left a message for the patient to call.

## 2016-06-03 NOTE — Telephone Encounter (Signed)
I called and spoke with the patient. He is aware that his paperwork & RX have been faxed to the [place] and he should hear from them regarding approval for Eliquis. I have advised him not to start Eliquis until he sees the coumadin clinic to help transition him off of warfarin and on to Eliquis. He voices understanding.

## 2016-06-07 ENCOUNTER — Ambulatory Visit (INDEPENDENT_AMBULATORY_CARE_PROVIDER_SITE_OTHER): Payer: Medicare Other | Admitting: Pharmacist

## 2016-06-07 DIAGNOSIS — I480 Paroxysmal atrial fibrillation: Secondary | ICD-10-CM

## 2016-06-07 LAB — POCT INR: INR: 1.9

## 2016-06-14 ENCOUNTER — Telehealth: Payer: Self-pay | Admitting: *Deleted

## 2016-06-14 ENCOUNTER — Encounter: Payer: Self-pay | Admitting: Internal Medicine

## 2016-06-14 ENCOUNTER — Encounter: Payer: Self-pay | Admitting: Family Medicine

## 2016-06-14 NOTE — Telephone Encounter (Signed)
Pt has received his Eliquis from the [place]. After discussing this with [name] [name] D, he will skip Coumadin today and discontinue and start Eliquis tomorrow with PM dose, then on Thursday start Eliquis Q12hrs. Follow up appt on 07/13/16

## 2016-07-06 DIAGNOSIS — H2512 Age-related nuclear cataract, left eye: Secondary | ICD-10-CM | POA: Diagnosis not present

## 2016-07-06 DIAGNOSIS — H43813 Vitreous degeneration, bilateral: Secondary | ICD-10-CM | POA: Diagnosis not present

## 2016-07-06 DIAGNOSIS — H35373 Puckering of macula, bilateral: Secondary | ICD-10-CM | POA: Diagnosis not present

## 2016-07-06 DIAGNOSIS — Z961 Presence of intraocular lens: Secondary | ICD-10-CM | POA: Diagnosis not present

## 2016-07-11 ENCOUNTER — Ambulatory Visit (INDEPENDENT_AMBULATORY_CARE_PROVIDER_SITE_OTHER): Payer: Medicare Other | Admitting: *Deleted

## 2016-07-11 DIAGNOSIS — I480 Paroxysmal atrial fibrillation: Secondary | ICD-10-CM

## 2016-07-12 LAB — BASIC METABOLIC PANEL
BUN / CREAT RATIO: 14 (ref 10–24)
BUN: 14 mg/dL (ref 8–27)
CHLORIDE: 98 mmol/L (ref 96–106)
CO2: 25 mmol/L (ref 18–29)
Calcium: 9.5 mg/dL (ref 8.6–10.2)
Creatinine, Ser: 0.99 mg/dL (ref 0.76–1.27)
GFR calc non Af Amer: 72 mL/min/{1.73_m2} (ref >59)
GFR, EST AFRICAN AMERICAN: 83 mL/min/{1.73_m2} (ref >59)
Glucose: 95 mg/dL (ref 65–99)
Potassium: 3.7 mmol/L (ref 3.5–5.2)
SODIUM: 142 mmol/L (ref 134–144)

## 2016-07-12 LAB — CBC
Hematocrit: 42.4 % (ref 37.5–51.0)
Hemoglobin: 14.3 g/dL (ref 13.0–17.7)
MCH: 31.8 pg (ref 26.6–33.0)
MCHC: 33.7 g/dL (ref 31.5–35.7)
MCV: 94 fL (ref 79–97)
PLATELETS: 221 10*3/uL (ref 150–379)
RBC: 4.5 x10E6/uL (ref 4.14–5.80)
RDW: 14.6 % (ref 12.3–15.4)
WBC: 5.4 10*3/uL (ref 3.4–10.8)

## 2016-07-12 NOTE — Progress Notes (Signed)
Pt was started on Eliquis 5mg s BID for Afib on 06/15/16.    Reviewed patients medication list.  Pt is not currently on any combined P-gp and strong CYP3A4 inhibitors/inducers (ketoconazole, traconazole, ritonavir, carbamazepine, phenytoin, rifampin, St. John's wort).  Reviewed labs.  SCr 0.99, Weight 92.9Kg, Age 70Yrs old.  Dose appropriate based on age, weight, and SCr.  Hgb and HCT 14.3/42.4.   A full discussion of the nature of anticoagulants has been carried out.  A benefit/risk analysis has been presented to the patient, so that they understand the justification for choosing anticoagulation with Eliquis at this time.  The need for compliance is stressed.  Pt is aware to take the medication twice daily.  Side effects of potential bleeding are discussed, including unusual colored urine or stools, coughing up blood or coffee ground emesis, nose bleeds or serious fall or head trauma.  Discussed signs and symptoms of stroke. The patient should avoid any OTC items containing aspirin or ibuprofen.  Avoid alcohol consumption.   Call if any signs of abnormal bleeding.  Discussed financial obligations and resolved any difficulty in obtaining medication.  Next lab test in 6 months on 01/16/17.

## 2016-07-15 ENCOUNTER — Ambulatory Visit: Payer: Medicare Other | Admitting: Family Medicine

## 2016-07-18 ENCOUNTER — Encounter: Payer: Self-pay | Admitting: Family Medicine

## 2016-07-18 ENCOUNTER — Ambulatory Visit (INDEPENDENT_AMBULATORY_CARE_PROVIDER_SITE_OTHER): Payer: Medicare Other | Admitting: Family Medicine

## 2016-07-18 DIAGNOSIS — R05 Cough: Secondary | ICD-10-CM

## 2016-07-18 DIAGNOSIS — R059 Cough, unspecified: Secondary | ICD-10-CM

## 2016-07-18 MED ORDER — RANITIDINE HCL 150 MG PO TABS: 150.0000 mg | ORAL_TABLET | Freq: Two times a day (BID) | ORAL | Status: DC

## 2016-07-18 MED ORDER — FLUTICASONE PROPIONATE 50 MCG/ACT NA SUSP: 2.0000 | Freq: Every day | NASAL | Status: DC

## 2016-07-18 NOTE — Patient Instructions (Signed)
Add on flonase, 2 sprays per nostril daily.  If not better after 10 days, then add on zantac 150mg  twice a day.  If not better after another 10 days, then update me.  Take care.  Glad to see you.

## 2016-07-18 NOTE — Progress Notes (Signed)
Cough continues.  Seems to be different than prev.  He has a sensation of phlegm in the throat.  Is clearing his throat.  Some rhinorrhea.  Constant.  He didn't know if GERD was contributing.  This is long term for patient.  Sometimes he does get sputum up, but that is rare.  The sputum is clear.  No FCNAVD.  No eye itching usually.  No ear pain.  No facial pain.  No vomiting, no diarrhea.  He hasn't tried meds recently.   No heartburn usually, only rare sx with spicy foods.    He has noted fatigue.  He is more able to fall asleep when resting.  He admits to not sleeping well at baseline, having to sleep in a recliner for about an hour, then getting up and going to bed.  He seems to sleep better that way.  He doesn't sleep well with starting sleep in a bed.  He snores some.  No known apnea.  No high risk events related to fatigue.    His brother had recently died. Condolences offered. Discussed with patient.  Meds, vitals, and allergies reviewed.   ROS: Per HPI unless specifically indicated in ROS section   GEN: nad, alert and oriented HEENT: mucous membranes moist, tm w/o erythema, nasal exam w/o erythema, clear discharge noted,  OP with cobblestoning NECK: supple w/o LA CV: rrr.   PULM: ctab, no inc wob EXT: no edema

## 2016-07-18 NOTE — Progress Notes (Signed)
Pre visit review using our clinic review tool, if applicable. No additional management support is needed unless otherwise documented below in the visit note. 

## 2016-07-19 NOTE — Assessment & Plan Note (Addendum)
Seems to be more of an issue with throat clearing. Differential diagnosis discussed with patient. Could be from postnasal drip. Could be related to GERD. Not on ACE or ARB. Nontoxic. Okay for outpatient follow-up Add on flonase, 2 sprays per nostril daily.  If not better after 10 days, then add on zantac 150mg  twice a day.  If not better after another 10 days, then update me.  He agrees.  Unclear if fatigue is related to the symptoms above. It may be that the postnasal drip or GERD is affecting his sleep. He does not have known sleep apnea. No high risk events. All discussed with patient. He will update me. We can workup his fatigue if his symptoms continue.   >25 minutes spent in face to face time with patient, >50% spent in counselling or coordination of care.

## 2016-08-06 ENCOUNTER — Encounter: Payer: Self-pay | Admitting: Family Medicine

## 2016-08-22 ENCOUNTER — Encounter: Payer: Self-pay | Admitting: Family Medicine

## 2016-08-22 ENCOUNTER — Ambulatory Visit (INDEPENDENT_AMBULATORY_CARE_PROVIDER_SITE_OTHER): Payer: Medicare Other | Admitting: Family Medicine

## 2016-08-22 DIAGNOSIS — R05 Cough: Secondary | ICD-10-CM

## 2016-08-22 DIAGNOSIS — R059 Cough, unspecified: Secondary | ICD-10-CM

## 2016-08-22 MED ORDER — DOXYCYCLINE HYCLATE 100 MG PO TABS: 100.0000 mg | ORAL_TABLET | Freq: Two times a day (BID) | ORAL | 0 refills | Status: DC

## 2016-08-22 MED ORDER — BENZONATATE 200 MG PO CAPS: 200.0000 mg | ORAL_CAPSULE | Freq: Three times a day (TID) | ORAL | 0 refills | Status: DC | PRN

## 2016-08-22 NOTE — Assessment & Plan Note (Signed)
Keep using regular meds but add on tessalon pills and doxycycline for presumed bronchitis.  Rest and fluids. Update me as needed.   He agrees. Update me as needed.

## 2016-08-22 NOTE — Progress Notes (Signed)
Cough.   The prev cough was some better with flonase and then addition to zantac.   This cough is different.  Went to [place] about 10 days ago.  Then had ST, cough.  Worse in the meantime.  Had started tussin in the meantime, w/o much relief.  "I was up half the night last night with a tickle in my throat."  Some sputum, yellow and clear.  Mult sick contacts.  Has been back in town for about 1 week.  No fevers.     Meds, vitals, and allergies reviewed.   ROS: Per HPI unless specifically indicated in ROS section   GEN: nad, alert and oriented HEENT: mucous membranes moist, tm w/o erythema, nasal exam w/o erythema, scant clear discharge noted,  OP with minimal cobblestoning NECK: supple w/o LA CV: IRR, not tachy PULM: ctab, no inc wob EXT: no edema SKIN: no acute rash

## 2016-08-22 NOTE — Patient Instructions (Signed)
Keep using your regular meds but add on tessalon pills for the cough and doxycycline for presumed bronchitis.  Rest and fluids.  Take care.  Glad to see you.  Update me as needed.

## 2016-08-22 NOTE — Progress Notes (Signed)
Pre visit review using our clinic review tool, if applicable. No additional management support is needed unless otherwise documented below in the visit note. 

## 2016-08-23 ENCOUNTER — Telehealth: Payer: Self-pay

## 2016-08-23 NOTE — Telephone Encounter (Signed)
This should take a few more days before sig improvement.  If worse in the meantime, not just lack of improvement, then update [place].  Thanks.

## 2016-08-23 NOTE — Telephone Encounter (Signed)
Patient advised.

## 2016-08-23 NOTE — Telephone Encounter (Signed)
Pt left v/m; pt was seen 08/22/16; pt took med on 08/22/16 and today for bronchitis;pt does not feel any better and pt wants to know how long will have to wait to feel improvement.or is there a different med that will work faster. Pt request cb. [facility].

## 2016-08-23 NOTE — Telephone Encounter (Signed)
Patient says he thinks his cough was actually worse last night.  The Benzonatate is not helping at all.  Patient says he does not feel that any of the congestion is moving at all.  Could he use Mucinex and get some cough syrup?  (However, patient states he was prescribed some cough syrup with codeine a few years ago and he couldn't take it.)

## 2016-08-23 NOTE — Telephone Encounter (Signed)
I would try plain mucinex with lots of water.  I would avoid the other cough syrups given his prev reaction.  Thanks.

## 2016-08-23 NOTE — Telephone Encounter (Signed)
Pt called - he stated that if [name] is going to call something in, please call it in early, as his drug store closes early  Thanks

## 2016-08-26 ENCOUNTER — Encounter: Payer: Self-pay | Admitting: Family Medicine

## 2016-08-26 ENCOUNTER — Ambulatory Visit (INDEPENDENT_AMBULATORY_CARE_PROVIDER_SITE_OTHER): Payer: Medicare Other | Admitting: Family Medicine

## 2016-08-26 ENCOUNTER — Ambulatory Visit (INDEPENDENT_AMBULATORY_CARE_PROVIDER_SITE_OTHER)
Admission: RE | Admit: 2016-08-26 | Discharge: 2016-08-26 | Disposition: A | Discharge status: Home / Self Care | Payer: Medicare Other | Source: Ambulatory Visit | Attending: Family Medicine | Admitting: Family Medicine

## 2016-08-26 VITALS — BP 126/76 | HR 88 | Temp 98.6°F | Wt 203.0 lb

## 2016-08-26 DIAGNOSIS — R05 Cough: Secondary | ICD-10-CM

## 2016-08-26 DIAGNOSIS — R059 Cough, unspecified: Secondary | ICD-10-CM

## 2016-08-26 NOTE — Patient Instructions (Signed)
Go to the lab on the way out.  We'll contact you with your xray report. Try plain mucinex, not mucinex D, in the AM with a lot of water.  Try tussin cough syrup at night.  Take care.  Glad to see you.

## 2016-08-26 NOTE — Progress Notes (Signed)
Follow-up cough. He started taking tussin DM in the meantime.   Per patient, tessalon didn't help much.    Yesterday was a better day.  He got some sputum up yesterday.  Today he was coughing w/o much sputum production and "it's driving me nuts."   He was exposed to a red tide down in [place] recently.    Occ scant dark sputum. No fevers.  No vomiting.  No diarrhea.    Meds, vitals, and allergies reviewed.   ROS: Per HPI unless specifically indicated in ROS section   GEN: nad, alert and oriented, nontoxic.  HEENT: mucous membranes moist, nasal exam w/o erythema, clear discharge noted,  OP with cobblestoning NECK: supple w/o LA CV: rrr.   PULM: ctab, no inc wob, no wheeze. No focal decrease in breath sounds EXT: no edema SKIN: no acute rash

## 2016-08-26 NOTE — Progress Notes (Signed)
Pre visit review using our clinic review tool, if applicable. No additional management support is needed unless otherwise documented below in the visit note. 

## 2016-08-28 NOTE — Assessment & Plan Note (Addendum)
Presumed bronchitis. Nontoxic. He may just need more time for resolution. Check chest x-ray today. Okay for outpatient follow-up. Try plain mucinex, not mucinex D, in the AM with a lot of water.  Try tussin cough syrup at night. Continue antibiotics. Update me as needed. See notes on imaging.

## 2016-09-05 ENCOUNTER — Telehealth: Payer: Self-pay

## 2016-09-05 DIAGNOSIS — R05 Cough: Secondary | ICD-10-CM

## 2016-09-05 DIAGNOSIS — R059 Cough, unspecified: Secondary | ICD-10-CM

## 2016-09-05 NOTE — Telephone Encounter (Signed)
Patient advised.

## 2016-09-05 NOTE — Telephone Encounter (Signed)
No reason to suspect infection at this point.  If using flonase 2 sprays per nostril per day, then would inc to 2 sprays per nostril BID for 1 week.  Okay to continue OTC antihistamine in the meantime.   Thanks.

## 2016-09-05 NOTE — Telephone Encounter (Signed)
Pt left /vm; pt seen 08/22/16 and 08/26/16 with bronchitis; pt has finished medication and is not much better; pt continues with prod cough with clear phlegm; has a lot of mucus when blows nose. No fever, no wheezing and SOB. pt request different med to [facility]. Pt request cb when med sent to pharmacy.

## 2016-09-14 ENCOUNTER — Other Ambulatory Visit: Payer: Self-pay | Admitting: Internal Medicine

## 2016-09-14 NOTE — Telephone Encounter (Signed)
Patient notified as instructed by telephone and verbalized understanding. Advised patient that he will hear back from one of the referral coordinators to get this set up for him.

## 2016-09-14 NOTE — Telephone Encounter (Signed)
I need more details.  Amount of cough, timing, severity, any associated sx, etc. Thanks.

## 2016-09-14 NOTE — Addendum Note (Signed)
Addended by: Tonia Ghent on: 09/14/2016 01:49 PM   Modules accepted: Orders

## 2016-09-14 NOTE — Telephone Encounter (Signed)
I think we should get him over to the allergy clinic.  I put in the referral.  I wouldn't start oral prednisone at this point.   I think that referral is best.  Thanks.

## 2016-09-14 NOTE — Telephone Encounter (Signed)
Pt left v/m; pt still has deep cough and wants to get something done to get rid of this cough. Pt will come in for appt if needed but pt has been seen twice on 08/22/16 and 08/26/16. Pt request cb . [facility].

## 2016-09-14 NOTE — Telephone Encounter (Signed)
Spoke to patient by telephone and was advised that he is having a tickling in the back of his throat that feels like post nasal drip. Patient stated that he has a cough that is primarily non-productive, but if he is able to get anything up it is clear. Patient stated that he has been using Mucinex, Flonase and Zantac as recommended. Patient stated that he has some nasal congestion and is able to blow out clear congestion as times. Patient stated that he does not have a fever. Patient stated that the cough is just driving him and his wife crazy.

## 2016-09-21 ENCOUNTER — Ambulatory Visit (INDEPENDENT_AMBULATORY_CARE_PROVIDER_SITE_OTHER): Payer: Medicare Other | Admitting: Primary Care

## 2016-09-21 ENCOUNTER — Encounter: Payer: Self-pay | Admitting: Primary Care

## 2016-09-21 VITALS — BP 126/82 | HR 84 | Temp 98.3°F | Wt 202.4 lb

## 2016-09-21 DIAGNOSIS — R05 Cough: Secondary | ICD-10-CM | POA: Diagnosis not present

## 2016-09-21 DIAGNOSIS — J309 Allergic rhinitis, unspecified: Secondary | ICD-10-CM

## 2016-09-21 DIAGNOSIS — R053 Chronic cough: Secondary | ICD-10-CM

## 2016-09-21 DIAGNOSIS — R059 Cough, unspecified: Secondary | ICD-10-CM

## 2016-09-21 MED ORDER — LEVOCETIRIZINE DIHYDROCHLORIDE 5 MG PO TABS: 5.0000 mg | ORAL_TABLET | Freq: Every evening | ORAL | 1 refills | Status: DC

## 2016-09-21 NOTE — Progress Notes (Signed)
Subjective:    Patient ID: [name] [name], male    DOB: 09-28-1934, 82 y.o.   MRN: [id number]  HPI  [name] [name] is an 82 year old male who presents today with a chief complaint of dry cough. His cough is chronic and has been present since early March 2018. He was treated for bronchitis with Doxycycline, Zantac, Flonase, Mucinex, Tessalon Pearls, Robitussin DM. He's been seen numerous times in our office for the chronic cough without resolve. He phoned into our office on 09/14/16 reporting no improvement so he was referred to the [facility] and has an appointment scheduled in 5 weeks.  He has a history of tobacco abuse with a 25 pack year history. He quit smoking in 1982. His last chest xray was on 08/26/16 and was without mention of COPD/emphysema and was negative for pulmonary nodules/masses. His last echocardiogram was on 02/12/16 and with mild LVH, with EF of 60-65%, moderate aortic stenosis.  He denies shortness of breath, history of asthma, fevers, chills, fatigue, esophageal burning, epigastric pain, lower extremity edema. He's no longer taking Zantac as this was ineffective. He's taken Zyrtec for 8-10 years.   Review of Systems  Constitutional: Negative for fatigue and fever.  HENT: Positive for postnasal drip. Negative for congestion and sore throat.   Respiratory: Positive for cough. Negative for shortness of breath and wheezing.   Cardiovascular: Negative for leg swelling.  Gastrointestinal: Negative for abdominal pain.       Denies esophageal burning, epigastric pain, belching.       Past Medical History:  Diagnosis Date  . Anticoagulated on Coumadin    followed in [facility] at [facility]  . Aortic stenosis    a. Echo 2/15: Severe LVH, EF 55-60%, Gr 1 DD, mild to mod AS (mean 20 mmHg, peak 31 mmHg), AVA 1.4-1.5 cm2, mild AI, MAC, trivial MR, LA upper limits of normal, RVSP 22 mmHg, mild RAE;   b. Echo 2/16:  EF 55-60%, no RWMA, mild AS (mean 17 mmHg, peak 27 mmHg),  AVA 1.5 cm2, mild AI, MAC, mild LAE  . Arthritis   . Bladder neoplasm   . Chronic cough    NO CARDIAC OR PULMONARY RELATED  . Coronary artery disease CARDIOLOGIST-  DR KLEIN/ [facility]   a. s/p CABG 2004, b. LHC with patent grafts 07/2005, ejection fraction 50%;  c. Myoview 2/15: no ischemia, EF 61%  . Dry eyes   . Dyslipidemia   . GERD (gastroesophageal reflux disease)   . HTN (hypertension)   . Mild aortic stenosis   . PAF (paroxysmal atrial fibrillation) ([place])    CHADS2-VASc:  4  . Paroxysmal atrial fibrillation (HCC)   . 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 Amiodarone Rx  . S/P CABG x 5 2004     Social History   Social History  . Marital status: Married    Spouse name: N/A  . Number of children: 3  . Years of education: N/A   Occupational History  . Retired    Social History Main Topics  . Smoking status: Former Smoker    Packs/day: 1.00    Years: 25.00    Types: Cigarettes    Quit date: 04/25/1980  . Smokeless tobacco: Never Used  . Alcohol use No  . Drug use: No  . Sexual activity: Not on file   Other Topics Concern  . Not on file   Social History Narrative   [employer]56-'58, overseas to  [place]   Some care through [place]   No service disability    Past Surgical History:  Procedure Laterality Date  . CARDIAC CATHETERIZATION  07-26-2005  DR GAMBLE   PRESERVED LVF/  EF 50%/ PATENT GRAFTS  . CARDIAC CATHETERIZATION  03-04-2003  DR GAMBLE   SEVERE 3 VESSEL DISEASE  . [place]   PAF/ FALSE POSITIVE STRESS TEST  . CATARACT EXTRACTION W/ INTRAOCULAR LENS IMPLANT Right   . CORONARY ARTERY BYPASS GRAFT  03-08-2003  DR [id number]   LIMA TO LAD/ SVG TO OM2/  SVG TO DIAGONAL / SVG TO PDA & PLA  . CYSTOSCOPY WITH BIOPSY N/A 12/25/2012   Procedure: CYSTOSCOPY WITH BLADDER BIOPSY;  Surgeon: Fredricka Bonine, MD;  Location: [facility];  Service: Urology;  Laterality: N/A;  .  ELECTROPHYSIOLOGY STUDY  07-27-2005  DR Carleene Overlie TAYLOR   MAPPING --   RESULT NONINDUCIBLE VT OR SVT/  DX RIGHT VENTRICULAR OUTFLOW TRACT VT AND RULES OUT MORE MALIGNANT CAUSES OF VT  . INGUINAL HERNIA REPAIR Bilateral Cascade  . KNEE ARTHROSCOPY Right 1994  . LUMBAR DISC SURGERY  1999   L4 -- L5  . REMOVAL VOCAL CORD POLYPS  1978  . TONSILLECTOMY  AS CHILD  . TRANSTHORACIC ECHOCARDIOGRAM  08-08-2011   MILD LVH/  EF 45-85%/  GRADE I DIASTOLIC DYSFUNCTION/ MILD AV STENOSIS    Family History  Problem Relation Age of Onset  . Cancer Mother     Allergies  Allergen Reactions  . Ace Inhibitors Other (See Comments)    COUGH  . Hycodan [Hydrocodone-Homatropine] Nausea And Vomiting  . Oxycodone Nausea Only    Pt states that he cannot take prescription pain medication  . Zocor [Simvastatin] Other (See Comments)    MYALGIA  . Cephalexin Rash    Current Outpatient Prescriptions on File Prior to Visit  Medication Sig Dispense Refill  . acetaminophen (TYLENOL) 325 MG tablet Take 650 mg by mouth once as needed for moderate pain or headache.    Marland Kitchen amiodarone (PACERONE) 200 MG tablet Take 100 mg by mouth daily.     Marland Kitchen apixaban (ELIQUIS) 5 MG TABS tablet Take 1 tablet (5 mg total) by mouth 2 (two) times daily. 180 tablet 3  . butalbital-acetaminophen-caffeine (FIORICET, ESGIC) 50-325-40 MG per tablet Take 1 tablet by mouth once as needed for headache.     . cholecalciferol (VITAMIN D) 1000 UNITS tablet Take 1,000 Units by mouth daily.    . hydrochlorothiazide (HYDRODIURIL) 25 MG tablet TAKE 1 TABLET BY MOUTH EACH MORNING 90 tablet 1  . Polyethyl Glycol-Propyl Glycol (SYSTANE ULTRA OP) Apply 1-2 drops to eye once as needed (dry eyes.).     Marland Kitchen potassium chloride (K-DUR) 10 MEQ tablet Take 10 mEq by mouth every morning. Take as directed    . pravastatin (PRAVACHOL) 80 MG tablet Take 80 mg by mouth every evening.     . ranitidine (ZANTAC) 150 MG tablet Take 1 tablet (150 mg total) by mouth 2 (two)  times daily.     No current facility-administered medications on file prior to visit.     BP 126/82   Pulse 84   Temp 98.3 F (36.8 C) (Oral)   Wt 202 lb 6.4 oz (91.8 kg)   SpO2 96%   BMI 29.04 kg/m    Objective:   Physical Exam  Constitutional: He appears well-nourished.  HENT:  Right Ear: Tympanic membrane and ear canal normal.  Left Ear: Tympanic membrane and ear canal normal.  Nose: No mucosal edema. Right sinus exhibits no maxillary sinus tenderness and no frontal sinus tenderness. Left sinus exhibits no maxillary sinus tenderness and no frontal sinus tenderness.  Mouth/Throat: Oropharynx is clear and moist.  Eyes: Conjunctivae are normal.  Neck: Neck supple.  Cardiovascular: Normal rate and regular rhythm.   No lower extremity edema noted bilaterally  Pulmonary/Chest: Effort normal and breath sounds normal. He has no wheezes. He has no rales.  Intermittent dry cough noted during exam  Skin: Skin is warm and dry.          Assessment & Plan:

## 2016-09-21 NOTE — Assessment & Plan Note (Addendum)
Based off of chart review, this seems to be an intermittent/ongoing issue for years. Reviewed recent echocardiogram, pulmonary function tests from 2013, all recent office visit notes from PCP, recent chest x-ray. Based off of his symptoms and description of symptoms this seems to be more allergy related. He is not acutely ill. Differential list includes COPD, silent GERD. Consider repeating PFTs. Will refrain use of albuterol inhaler at this time given history of tachycardia. We'll have him evaluated per allergist as scheduled. Will have him stop Zyrtec, switch to Xyzal. Recommended he continue use of Zantac for possible silent GERD. Will send to PCP for any further recommendations.

## 2016-09-21 NOTE — Patient Instructions (Addendum)
Stop Zyrtec.  Start levocetirizine (Xyzal) 5 mg tablets for allergies and chronic cough. Take 1 tablet by mouth every evening.  I'll speak with [name] [name] regarding your appointment date with the allergist. We will try to get you in sooner if possible.  It was a pleasure meeting you!

## 2016-09-21 NOTE — Progress Notes (Signed)
I think this is reasonable, I would only make the changes you suggested.  I would greatly appreciate allergy clinic input.   GSD.

## 2016-10-05 ENCOUNTER — Ambulatory Visit (INDEPENDENT_AMBULATORY_CARE_PROVIDER_SITE_OTHER): Payer: Medicare Other | Admitting: Internal Medicine

## 2016-10-05 VITALS — BP 142/90 | Ht 71.0 in | Wt 203.0 lb

## 2016-10-05 DIAGNOSIS — I10 Essential (primary) hypertension: Secondary | ICD-10-CM

## 2016-10-05 DIAGNOSIS — I350 Nonrheumatic aortic (valve) stenosis: Secondary | ICD-10-CM

## 2016-10-05 DIAGNOSIS — Z79899 Other long term (current) drug therapy: Secondary | ICD-10-CM

## 2016-10-05 DIAGNOSIS — I480 Paroxysmal atrial fibrillation: Secondary | ICD-10-CM

## 2016-10-05 DIAGNOSIS — I2589 Other forms of chronic ischemic heart disease: Secondary | ICD-10-CM

## 2016-10-05 DIAGNOSIS — I255 Ischemic cardiomyopathy: Secondary | ICD-10-CM | POA: Diagnosis not present

## 2016-10-05 MED ORDER — DILTIAZEM HCL ER COATED BEADS 120 MG PO CP24: ORAL_CAPSULE | ORAL | 2 refills | Status: DC

## 2016-10-05 NOTE — Patient Instructions (Addendum)
Medication Instructions: Your physician has recommended you make the following change in your medication:  -1) Stop Amiodarone 2) Start Diltiazem 120 mg - take one capsule by mouth once daily (start only if your heart rates start to increase)  Labwork: None Ordered  Procedures/Testing: Your physician has requested that you have an echocardiogram. Echocardiography is a painless test that uses sound waves to create images of your heart. It provides your doctor with information about the size and shape of your heart and how well your heart's chambers and valves are working. This procedure takes approximately one hour. There are no restrictions for this procedure.  Follow-Up: Your physician recommends that you schedule a follow-up appointment in 2 MONTHS with [name] Devoid, NP in [facility]   Any Additional Special Instructions Will Be Listed Below (If Applicable).     If you need a refill on your cardiac medications before your next appointment, please call your pharmacy.

## 2016-10-05 NOTE — Progress Notes (Signed)
Patient Care Team: Tonia Ghent, MD as PCP - General (Family Medicine)   HPI  [name] [name] is a 82 y.o. male Seen in followup for atrial fibrillation and VT been treated with amiodarone. Thromboembolic risk factors are notable for a chads vas score of 3.  He is taking apixoban which she is tolerating without issues..  He has also had recurrent syncope-post micturition.  He underwent monitoring after initial visit to clarify the presence of atrial fibrillation.This prompted reinitiation of his warfarin and his amiodarone. He had  seen Dr. Rayann Heman to consider catheter ablation but decided that he would continue on amiodarone.  He has been on it for some time  He has a history of ischemic heart disease with prior bypass surgery 2004 Catheterization 2007 demonstrated patent grafts  He has had a problem with a cough. His ARB was changed from cozaaar >> benicar last year.    Date DLCO  12/13 83 % pred  1/18 74% pred    Echocardiogram 2/16 demonstrated mild aortic stenosis with a mean gradient of 17 ; ejection fraction was 55-60%. DATE TEST    2/16    Echo   EF 55-60 % AS mild Gradient 17                 Date TSH LFTs PFTs  1/15  4.17     11/17  3.63 11      He is doing relatively well. He has noted some irregularity in heart beat over the last weeks. There've been no change in exercise tolerance. He's had no edema and no chest pain. He continues to complain of a chronic cough.      Past Medical History:  Diagnosis Date  . Anticoagulated on Coumadin    followed in CVRR at [facility]  . Aortic stenosis    a. Echo 2/15: Severe LVH, EF 55-60%, Gr 1 DD, mild to mod AS (mean 20 mmHg, peak 31 mmHg), AVA 1.4-1.5 cm2, mild AI, MAC, trivial MR, LA upper limits of normal, RVSP 22 mmHg, mild RAE;   b. Echo 2/16:  EF 55-60%, no RWMA, mild AS (mean 17 mmHg, peak 27 mmHg), AVA 1.5 cm2, mild AI, MAC, mild LAE  . Arthritis   . Bladder neoplasm   . Chronic cough    NO  CARDIAC OR PULMONARY RELATED  . Coronary artery disease CARDIOLOGIST-  DR Adrinne Sze/ [facility]   a. s/p CABG 2004, b. LHC with patent grafts 07/2005, ejection fraction 50%;  c. Myoview 2/15: no ischemia, EF 61%  . Dry eyes   . Dyslipidemia   . GERD (gastroesophageal reflux disease)   . HTN (hypertension)   . Mild aortic stenosis   . PAF (paroxysmal atrial fibrillation) ([place])    CHADS2-VASc:  4  . Paroxysmal atrial fibrillation (HCC)   . 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 Amiodarone Rx  . S/P CABG x 5 2004    Past Surgical History:  Procedure Laterality Date  . CARDIAC CATHETERIZATION  07-26-2005  DR GAMBLE   PRESERVED LVF/  EF 50%/ PATENT GRAFTS  . CARDIAC CATHETERIZATION  03-04-2003  DR GAMBLE   SEVERE 3 VESSEL DISEASE  . [place]   PAF/ FALSE POSITIVE STRESS TEST  . CATARACT EXTRACTION W/ INTRAOCULAR LENS IMPLANT Right   . CORONARY ARTERY BYPASS GRAFT  03-08-2003  DR [id number]   LIMA TO LAD/ SVG TO OM2/  SVG  TO DIAGONAL / SVG TO PDA & PLA  . CYSTOSCOPY WITH BIOPSY N/A 12/25/2012   Procedure: CYSTOSCOPY WITH BLADDER BIOPSY;  Surgeon: Fredricka Bonine, MD;  Location: [facility];  Service: Urology;  Laterality: N/A;  . ELECTROPHYSIOLOGY STUDY  07-27-2005  DR Carleene Overlie [facility]   MAPPING --   RESULT NONINDUCIBLE VT OR SVT/  DX RIGHT VENTRICULAR OUTFLOW TRACT VT AND RULES OUT MORE MALIGNANT CAUSES OF VT  . INGUINAL HERNIA REPAIR Bilateral [place]  . KNEE ARTHROSCOPY Right 1994  . LUMBAR DISC SURGERY  1999   L4 -- L5  . REMOVAL VOCAL CORD POLYPS  1978  . TONSILLECTOMY  AS CHILD  . TRANSTHORACIC ECHOCARDIOGRAM  08-08-2011   MILD LVH/  EF 34-19%/  GRADE I DIASTOLIC DYSFUNCTION/ MILD AV STENOSIS    Current Outpatient Prescriptions  Medication Sig Dispense Refill  . acetaminophen (TYLENOL) 325 MG tablet Take 650 mg by mouth once as needed for moderate pain or headache.    Marland Kitchen amiodarone  (PACERONE) 200 MG tablet Take 100 mg by mouth daily.     Marland Kitchen apixaban (ELIQUIS) 5 MG TABS tablet Take 1 tablet (5 mg total) by mouth 2 (two) times daily. 180 tablet 3  . butalbital-acetaminophen-caffeine (FIORICET, ESGIC) 50-325-40 MG per tablet Take 1 tablet by mouth once as needed for headache.     . cholecalciferol (VITAMIN D) 1000 UNITS tablet Take 1,000 Units by mouth daily.    . hydrochlorothiazide (HYDRODIURIL) 25 MG tablet TAKE 1 TABLET BY MOUTH EACH MORNING 90 tablet 1  . levocetirizine (XYZAL) 5 MG tablet Take 1 tablet (5 mg total) by mouth every evening. 30 tablet 1  . Polyethyl Glycol-Propyl Glycol (SYSTANE ULTRA OP) Apply 1-2 drops to eye once as needed (dry eyes.).     Marland Kitchen potassium chloride (K-DUR) 10 MEQ tablet Take 10 mEq by mouth every morning. Take as directed    . pravastatin (PRAVACHOL) 80 MG tablet Take 80 mg by mouth every evening.     . ranitidine (ZANTAC) 150 MG tablet Take 1 tablet (150 mg total) by mouth 2 (two) times daily.     No current facility-administered medications for this visit.     Allergies  Allergen Reactions  . Ace Inhibitors Other (See Comments)    COUGH  . Hycodan [Hydrocodone-Homatropine] Nausea And Vomiting  . Oxycodone Nausea Only    Pt states that he cannot take prescription pain medication  . Zocor [Simvastatin] Other (See Comments)    MYALGIA  . Cephalexin Rash    Review of Systems negative except from HPI and PMH  Physical Exam BP (!) 142/90   Ht 5' 11"  (1.803 m)   Wt 203 lb (92.1 kg)   SpO2 98%   BMI 28.31 kg/m  Well developed and nourished in no acute distress HENT normal Neck supple with JVP-flat; carotids brisk  Clear Regular rate and rhythm, 2/6 systolic  Abd-soft with active BS No Clubbing cyanosis edema Skin-warm and dry A & Oriented  Grossly normal sensory and motor function  ECG was ordered today demonstrated Atrial fibrillation at 80 Intervals-/15/41  This is distinct from 11/17 when he was in sinus rhythm     Assessment and  Plan  Aortic stenosis  Mild  Atrial fibrillation-Paroxysmal   Hypertension  Ischemic heart disease  S/p CABG  With near normal LV function  syncope   Without symptoms of ischemia  Atrial fibrillation is Persistent. That being the case, we have discussed cardioversion discontinuation of  amiodarone. He would like the latter and I think that that is most reasonable. We have discussed that his heart rates me increasing we have given him a prescription for diltiazem to take when and if that occurs.  The cough may be related to amiodarone. We'll have to see.  No interval syncope.  Needs repeat echo to look at his aortic valve     More than 50% of 45 min was spent in counseling related to the above

## 2016-10-19 ENCOUNTER — Ambulatory Visit (HOSPITAL_COMMUNITY): Payer: Medicare Other | Attending: Cardiovascular Disease

## 2016-10-19 ENCOUNTER — Other Ambulatory Visit: Payer: Self-pay

## 2016-10-19 DIAGNOSIS — I350 Nonrheumatic aortic (valve) stenosis: Secondary | ICD-10-CM

## 2016-10-19 DIAGNOSIS — Z951 Presence of aortocoronary bypass graft: Secondary | ICD-10-CM | POA: Insufficient documentation

## 2016-10-19 DIAGNOSIS — I352 Nonrheumatic aortic (valve) stenosis with insufficiency: Secondary | ICD-10-CM | POA: Diagnosis not present

## 2016-10-19 DIAGNOSIS — I2510 Atherosclerotic heart disease of native coronary artery without angina pectoris: Secondary | ICD-10-CM | POA: Insufficient documentation

## 2016-10-19 DIAGNOSIS — E785 Hyperlipidemia, unspecified: Secondary | ICD-10-CM | POA: Diagnosis not present

## 2016-10-19 DIAGNOSIS — I119 Hypertensive heart disease without heart failure: Secondary | ICD-10-CM | POA: Insufficient documentation

## 2016-10-19 DIAGNOSIS — I348 Other nonrheumatic mitral valve disorders: Secondary | ICD-10-CM | POA: Diagnosis not present

## 2016-10-21 ENCOUNTER — Telehealth: Payer: Self-pay | Admitting: Internal Medicine

## 2016-10-21 NOTE — Telephone Encounter (Signed)
The patient is aware of his echo results.

## 2016-10-21 NOTE — Telephone Encounter (Signed)
New message ° ° ° °Pt is returning call for results. °

## 2016-10-25 ENCOUNTER — Ambulatory Visit: Payer: Self-pay | Admitting: Allergy and Immunology

## 2016-10-27 ENCOUNTER — Telehealth: Payer: Self-pay | Admitting: Internal Medicine

## 2016-10-27 NOTE — Telephone Encounter (Signed)
His atrial fibrillation is now permanent. We would have anticipated the heart rate going faster with the amiodarone coming out of his system. Now that he notes that his heart rate is fast it would be appropriate to starting the diltiazem. We don't want him to develop symptoms subsequently

## 2016-10-27 NOTE — Telephone Encounter (Signed)
To Dr. Caryl Comes to clarify.

## 2016-10-27 NOTE — Telephone Encounter (Signed)
Follow Up:   Pt said he was told  By Dr Caryl Comes if his heart rate increased to contact him. Yesterday it starting going up, it was 103 and 101 when he checked it.Please call to advise.Pt's heart rate this morning is 117.

## 2016-10-27 NOTE — Telephone Encounter (Signed)
Patient reports that his HR was 117 this AM when he called. Currently, his HR is 105 (but he went up and down the staircase to get the monitor) and BP 122/73.  He did not not have any symptoms when his HR was 117 and is still asymptomatic now. He stopped his amiodarone at his last OV with Dr. Caryl Comes and was given a prescription for Diltiazem to take if his HR is elevated. He states Dr. Caryl Comes told him to call prior to starting the medication.  Since he is asymptomatic and his HR is down somewhat, patient requests a call from Dr. Olin Pia nurse later today with HR parameters when to take the Diltiazem. He was grateful for assistance.

## 2016-10-28 DIAGNOSIS — R3129 Other microscopic hematuria: Secondary | ICD-10-CM | POA: Diagnosis not present

## 2016-10-28 DIAGNOSIS — N200 Calculus of kidney: Secondary | ICD-10-CM | POA: Diagnosis not present

## 2016-10-28 MED ORDER — DILTIAZEM HCL ER COATED BEADS 120 MG PO CP24: ORAL_CAPSULE | ORAL | Status: DC

## 2016-10-28 NOTE — Telephone Encounter (Signed)
I called and spoke with the patient- I notified him of Dr. Olin Pia recommendations to start diltiazem 120 mg once daily. He is aware to monitor his heart rates and let us know if they do not seem to be coming down. He voices understanding.

## 2016-10-31 DIAGNOSIS — Z800 Family history of malignant neoplasm of digestive organs: Secondary | ICD-10-CM | POA: Insufficient documentation

## 2016-10-31 DIAGNOSIS — K219 Gastro-esophageal reflux disease without esophagitis: Secondary | ICD-10-CM | POA: Diagnosis not present

## 2016-10-31 DIAGNOSIS — M1990 Unspecified osteoarthritis, unspecified site: Secondary | ICD-10-CM | POA: Insufficient documentation

## 2016-10-31 DIAGNOSIS — I4891 Unspecified atrial fibrillation: Secondary | ICD-10-CM | POA: Insufficient documentation

## 2016-10-31 DIAGNOSIS — I25810 Atherosclerosis of coronary artery bypass graft(s) without angina pectoris: Secondary | ICD-10-CM | POA: Insufficient documentation

## 2016-10-31 DIAGNOSIS — Z86010 Personal history of colonic polyps: Secondary | ICD-10-CM | POA: Insufficient documentation

## 2016-11-14 ENCOUNTER — Other Ambulatory Visit: Payer: Self-pay

## 2016-11-24 DIAGNOSIS — N400 Enlarged prostate without lower urinary tract symptoms: Secondary | ICD-10-CM | POA: Diagnosis not present

## 2016-11-24 DIAGNOSIS — N200 Calculus of kidney: Secondary | ICD-10-CM | POA: Diagnosis not present

## 2016-12-06 ENCOUNTER — Ambulatory Visit (HOSPITAL_COMMUNITY): Payer: Medicare Other | Admitting: Nurse Practitioner

## 2016-12-12 ENCOUNTER — Ambulatory Visit (HOSPITAL_COMMUNITY): Payer: Medicare Other | Admitting: Nurse Practitioner

## 2016-12-15 ENCOUNTER — Telehealth: Payer: Self-pay | Admitting: Family Medicine

## 2016-12-15 NOTE — Telephone Encounter (Signed)
Pt declined AWV. °

## 2016-12-19 ENCOUNTER — Ambulatory Visit (HOSPITAL_COMMUNITY)
Admission: RE | Admit: 2016-12-19 | Discharge: 2016-12-19 | Disposition: A | Discharge status: Home / Self Care | Payer: Medicare Other | Source: Ambulatory Visit | Attending: Nurse Practitioner | Admitting: Nurse Practitioner

## 2016-12-19 ENCOUNTER — Encounter (HOSPITAL_COMMUNITY): Payer: Self-pay | Admitting: Nurse Practitioner

## 2016-12-19 VITALS — BP 126/74 | HR 85 | Ht 71.0 in | Wt 202.4 lb

## 2016-12-19 DIAGNOSIS — M1990 Unspecified osteoarthritis, unspecified site: Secondary | ICD-10-CM | POA: Insufficient documentation

## 2016-12-19 DIAGNOSIS — K219 Gastro-esophageal reflux disease without esophagitis: Secondary | ICD-10-CM | POA: Insufficient documentation

## 2016-12-19 DIAGNOSIS — Z951 Presence of aortocoronary bypass graft: Secondary | ICD-10-CM | POA: Insufficient documentation

## 2016-12-19 DIAGNOSIS — Z87891 Personal history of nicotine dependence: Secondary | ICD-10-CM | POA: Diagnosis not present

## 2016-12-19 DIAGNOSIS — I481 Persistent atrial fibrillation: Secondary | ICD-10-CM | POA: Insufficient documentation

## 2016-12-19 DIAGNOSIS — I4821 Permanent atrial fibrillation: Secondary | ICD-10-CM

## 2016-12-19 DIAGNOSIS — I482 Chronic atrial fibrillation: Secondary | ICD-10-CM | POA: Diagnosis not present

## 2016-12-19 DIAGNOSIS — I10 Essential (primary) hypertension: Secondary | ICD-10-CM | POA: Diagnosis not present

## 2016-12-19 DIAGNOSIS — I2510 Atherosclerotic heart disease of native coronary artery without angina pectoris: Secondary | ICD-10-CM | POA: Diagnosis not present

## 2016-12-19 DIAGNOSIS — E785 Hyperlipidemia, unspecified: Secondary | ICD-10-CM | POA: Insufficient documentation

## 2016-12-19 DIAGNOSIS — I350 Nonrheumatic aortic (valve) stenosis: Secondary | ICD-10-CM | POA: Insufficient documentation

## 2016-12-19 DIAGNOSIS — Z7901 Long term (current) use of anticoagulants: Secondary | ICD-10-CM | POA: Diagnosis not present

## 2016-12-19 NOTE — Progress Notes (Signed)
Primary Care Physician: Tonia Ghent, MD Referring Physician: [facility] f/u EP: Dr. Theresia Bough is a 82 y.o. male with a h/o history of CAD s/p CABG (2004), mild AS, RVOT VT on amiodarone, PAF on amiodarone and coumadin who presented to [facility] ED on 02/11/16 for evaluation of an episode of syncope. He was found to be in afib. Marland Kitchen He has been treated with amiodarone for ~7 years. He has had a chronic cough. Pulmonary function studies show DLCO of 77%. He was seen by Dr. Rayann Heman in 11/2011 for evaluation for catheter ablation but opted to continue amiodarone therapy.  He woke up on 10/19 to empty his bladder. Next thing he was aware of was being on his knees in front of the toilet. He struggled to go back to bed and noted his PJ's were wet. When he awoke the next am he was in afib and noted that he had urinated in the floor. He has history of 2 other syncopal episodes over the last 5 years,  one of which occurred when he was bending over gardening and slightly dehydrated and nearly fainted, another one occurred after getting up from the bathroom in the middle of the night and walking back towards the bedroom.  He is in the afib clinic for f/u and has returned to [place]. After hospitalization, he continued to have some afib so he increased his amiodarone to 200 mg a day for the last 4 days. The last two days, he has had regular rhythm. He is interested in an ablation or change to Tikosyn for concern of long term side effects from amiodarone. A  LinQ monitor was also discussed while he was in the hospital for concerns of syncope but he declined. He currently feels back to his baseline.  F/u in the afib clinic for discontinuation of amiodarone by Dr. Caryl Comes on last visit for persistence of afib and concern for long term consequences, to make sure he was rate controlled. He did call back and asked for additional meds for rate control and he was started on cardizem 120 mg qd. Since then he has noted  his HR to drop from right over 100 to mid 80's. He feels well.  Today, he denies symptoms of palpitations, chest pain, shortness of breath, orthopnea, PND, lower extremity edema, dizziness, presyncope, syncope, or neurologic sequela. The patient is tolerating medications without difficulties and is otherwise without complaint today.   Past Medical History:  Diagnosis Date  . Anticoagulated on Coumadin    followed in [facility]RR at [facility]  . Aortic stenosis    a. Echo 2/15: Severe LVH, EF 55-60%, Gr 1 DD, mild to mod AS (mean 20 mmHg, peak 31 mmHg), AVA 1.4-1.5 cm2, mild AI, MAC, trivial MR, LA upper limits of normal, RVSP 22 mmHg, mild RAE;   b. Echo 2/16:  EF 55-60%, no RWMA, mild AS (mean 17 mmHg, peak 27 mmHg), AVA 1.5 cm2, mild AI, MAC, mild LAE  . Arthritis   . Bladder neoplasm   . Chronic cough    NO CARDIAC OR PULMONARY RELATED  . Coronary artery disease CARDIOLOGIST-  DR KLEIN/ [facility]   a. s/p CABG 2004, b. LHC with patent grafts 07/2005, ejection fraction 50%;  c. Myoview 2/15: no ischemia, EF 61%  . Dry eyes   . Dyslipidemia   . GERD (gastroesophageal reflux disease)   . HTN (hypertension)   . Mild aortic stenosis   . PAF (paroxysmal atrial  fibrillation) ([place])    CHADS2-VASc:  4  . Paroxysmal atrial fibrillation (HCC)   . 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 Amiodarone Rx  . S/P CABG x 5 2004   Past Surgical History:  Procedure Laterality Date  . CARDIAC CATHETERIZATION  07-26-2005  DR GAMBLE   PRESERVED LVF/  EF 50%/ PATENT GRAFTS  . CARDIAC CATHETERIZATION  03-04-2003  DR GAMBLE   SEVERE 3 VESSEL DISEASE  . [place]   PAF/ FALSE POSITIVE STRESS TEST  . CATARACT EXTRACTION W/ INTRAOCULAR LENS IMPLANT Right   . CORONARY ARTERY BYPASS GRAFT  03-08-2003  DR [id number]   LIMA TO LAD/ SVG TO OM2/  SVG TO DIAGONAL / SVG TO PDA & PLA  . CYSTOSCOPY WITH BIOPSY N/A 12/25/2012   Procedure: CYSTOSCOPY WITH  BLADDER BIOPSY;  Surgeon: Fredricka Bonine, MD;  Location: [facility];  Service: Urology;  Laterality: N/A;  . ELECTROPHYSIOLOGY STUDY  07-27-2005  DR Carleene Overlie TAYLOR   MAPPING --   RESULT NONINDUCIBLE VT OR SVT/  DX RIGHT VENTRICULAR OUTFLOW TRACT VT AND RULES OUT MORE MALIGNANT CAUSES OF VT  . INGUINAL HERNIA REPAIR Bilateral [place]  . KNEE ARTHROSCOPY Right 1994  . LUMBAR DISC SURGERY  1999   L4 -- L5  . REMOVAL VOCAL CORD POLYPS  1978  . TONSILLECTOMY  AS CHILD  . TRANSTHORACIC ECHOCARDIOGRAM  08-08-2011   MILD LVH/  EF 96-29%/  GRADE I DIASTOLIC DYSFUNCTION/ MILD AV STENOSIS    Current Outpatient Prescriptions  Medication Sig Dispense Refill  . acetaminophen (TYLENOL) 325 MG tablet Take 650 mg by mouth once as needed for moderate pain or headache.    Marland Kitchen apixaban (ELIQUIS) 5 MG TABS tablet Take 1 tablet (5 mg total) by mouth 2 (two) times daily. 180 tablet 3  . butalbital-acetaminophen-caffeine (FIORICET, ESGIC) 50-325-40 MG per tablet Take 1 tablet by mouth once as needed for headache.     . cholecalciferol (VITAMIN D) 1000 UNITS tablet Take 1,000 Units by mouth daily.    Marland Kitchen diltiazem (CARDIZEM CD) 120 MG 24 hr capsule Take 1 capsule (120 mg) by mouth once daily    . hydrochlorothiazide (HYDRODIURIL) 25 MG tablet TAKE 1 TABLET BY MOUTH EACH MORNING 90 tablet 1  . levocetirizine (XYZAL) 5 MG tablet Take 1 tablet (5 mg total) by mouth every evening. 30 tablet 1  . Polyethyl Glycol-Propyl Glycol (SYSTANE ULTRA OP) Apply 1-2 drops to eye once as needed (dry eyes.).     Marland Kitchen potassium chloride (K-DUR) 10 MEQ tablet Take 10 mEq by mouth every morning. Take as directed    . pravastatin (PRAVACHOL) 80 MG tablet Take 80 mg by mouth every evening.     . ranitidine (ZANTAC) 150 MG tablet Take 1 tablet (150 mg total) by mouth 2 (two) times daily. (Patient not taking: Reported on 12/19/2016)     No current facility-administered medications for this encounter.     Allergies    Allergen Reactions  . Ace Inhibitors Other (See Comments)    COUGH  . Hycodan [Hydrocodone-Homatropine] Nausea And Vomiting  . Oxycodone Nausea Only    Pt states that he cannot take prescription pain medication  . Zocor [Simvastatin] Other (See Comments)    MYALGIA  . Cephalexin Rash    Social History   Social History  . Marital status: Married    Spouse name: N/A  . Number of children: 3  .  Years of education: N/A   Occupational History  . Retired    Social History Main Topics  . Smoking status: Former Smoker    Packs/day: 1.00    Years: 25.00    Types: Cigarettes    Quit date: 04/25/1980  . Smokeless tobacco: Never Used  . Alcohol use No  . Drug use: No  . Sexual activity: Not on file   Other Topics Concern  . 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 Cancer Mother     ROS- All systems are reviewed and negative except as per the HPI above  Physical Exam: Vitals:   12/19/16 1001  BP: 126/74  Pulse: 85  Weight: 202 lb 6.4 oz (91.8 kg)  Height: _0  (1.803 m)    GEN- The patient is well appearing, alert and oriented x 3 today.   Head- normocephalic, atraumatic Eyes-  Sclera clear, conjunctiva pink Ears- hearing intact Oropharynx- clear Neck- supple, no JVP Lymph- no cervical lymphadenopathy Lungs- Clear to ausculation bilaterally, normal work of breathing Heart- Regular rate and rhythm, 1-2 sys murmur, no rubs or gallops, PMI not laterally displaced GI- soft, NT, ND, + BS Extremities- no clubbing, cyanosis, or edema MS- no significant deformity or atrophy Skin- no rash or lesion Psych- euthymic mood, full affect Neuro- strength and sensation are intact  EKG- afib at 85 bpm, RBBB, qrs int 132 ms, qtc 490 ms  Epic records reviewed  Assessment and Plan: 1. Persistent afib Now off amiodarone with the rate  control being the goal He is now controlled with addition of diltiazem 120 mg bid Continue eliquis 5 mg bid  2. Aortic stenosis Mild to mod AS, stable by last echo 09/2016  F/u Dr. Caryl Comes in 6 months afib clinic as needed   Butch Penny C. Amalia [place], [facility] [name] [place], [place] [address] [phone]

## 2017-01-16 ENCOUNTER — Ambulatory Visit (INDEPENDENT_AMBULATORY_CARE_PROVIDER_SITE_OTHER): Payer: Medicare Other | Admitting: *Deleted

## 2017-01-16 DIAGNOSIS — I4891 Unspecified atrial fibrillation: Secondary | ICD-10-CM | POA: Diagnosis not present

## 2017-01-16 DIAGNOSIS — I255 Ischemic cardiomyopathy: Secondary | ICD-10-CM

## 2017-01-16 NOTE — Progress Notes (Signed)
Pt was started on Eliquis 5mg  for AFIB on 06/15/16 by Dr. Caryl Comes.    Reviewed patients medication list.  Pt is not currently on any combined P-gp and strong CYP3A4 inhibitors/inducers (ketoconazole, traconazole, ritonavir, carbamazepine, phenytoin, rifampin, St. John's wort).  Reviewed labs: SCr-0.99, Weight-89.4kg, Hgb-14.1, HCT-41.5.  Dose appropriate based on age, weight, and SCr.  Hgb and HCT within normal limits.    A full discussion of the nature of anticoagulants has been carried out.  A benefit/risk analysis has been presented to the patient, so that they understand the justification for choosing anticoagulation with Eliquis at this time.  The need for compliance is stressed.  Pt is aware to take the medication twice daily.  Side effects of potential bleeding are discussed, including unusual colored urine or stools, coughing up blood or coffee ground emesis, nose bleeds or serious fall or head trauma.  Discussed signs and symptoms of stroke. The patient should avoid any OTC items containing aspirin or ibuprofen.  Avoid alcohol consumption.   Call if any signs of abnormal bleeding.  Discussed financial obligations and resolved any difficulty in obtaining medication.    01/16/17: pt taken to the lab to have labs drawn  01/17/17: Called pt & discussed lab results & instructed pt to continue taking Eliquis 5mg  twice a day & call with any issues & he verbalized understanding.

## 2017-01-17 LAB — CBC
HEMATOCRIT: 41.5 % (ref 37.5–51.0)
Hemoglobin: 14.1 g/dL (ref 13.0–17.7)
MCH: 32.1 pg (ref 26.6–33.0)
MCHC: 34 g/dL (ref 31.5–35.7)
MCV: 95 fL (ref 79–97)
Platelets: 226 10*3/uL (ref 150–379)
RBC: 4.39 x10E6/uL (ref 4.14–5.80)
RDW: 14.5 % (ref 12.3–15.4)
WBC: 5.1 10*3/uL (ref 3.4–10.8)

## 2017-01-17 LAB — BASIC METABOLIC PANEL
BUN/Creatinine Ratio: 13 (ref 10–24)
BUN: 13 mg/dL (ref 8–27)
CO2: 28 mmol/L (ref 20–29)
Calcium: 9.5 mg/dL (ref 8.6–10.2)
Chloride: 101 mmol/L (ref 96–106)
Creatinine, Ser: 0.99 mg/dL (ref 0.76–1.27)
GFR, EST AFRICAN AMERICAN: 83 mL/min/{1.73_m2} (ref >59)
GFR, EST NON AFRICAN AMERICAN: 72 mL/min/{1.73_m2} (ref >59)
Glucose: 99 mg/dL (ref 65–99)
POTASSIUM: 3.9 mmol/L (ref 3.5–5.2)
SODIUM: 141 mmol/L (ref 134–144)

## 2017-01-23 ENCOUNTER — Ambulatory Visit (INDEPENDENT_AMBULATORY_CARE_PROVIDER_SITE_OTHER): Payer: Medicare Other

## 2017-01-23 ENCOUNTER — Telehealth: Payer: Self-pay

## 2017-01-23 DIAGNOSIS — Z23 Encounter for immunization: Secondary | ICD-10-CM

## 2017-01-23 NOTE — Telephone Encounter (Signed)
Pt is aware and agreeable to normal results  

## 2017-01-23 NOTE — Progress Notes (Signed)
Agree. Thanks

## 2017-01-24 NOTE — Progress Notes (Signed)
Please sign visit.

## 2017-01-29 NOTE — Progress Notes (Signed)
I can't sign off on this note.  Please change the assignment of the note so it can be closed.  Thanks.

## 2017-02-15 NOTE — Progress Notes (Signed)
I put in ticket with IT and spoke with Gerald Stabs in order to get chart where it can be closed.

## 2017-03-01 ENCOUNTER — Encounter: Payer: Self-pay | Admitting: Family Medicine

## 2017-03-02 ENCOUNTER — Telehealth: Payer: Self-pay | Admitting: Family Medicine

## 2017-03-02 NOTE — Telephone Encounter (Signed)
Patient's wife,[name],scheduled appointment on 05/03/17.

## 2017-03-02 NOTE — Telephone Encounter (Signed)
Call pt.  See mychart message.  He wanted to get his wife transferred over to me.  I am okay with that. Would need transfer appointment at some point.  Thanks.

## 2017-03-02 NOTE — Telephone Encounter (Signed)
Pt is calling and advising about the missed call. He really wants his wife to see [name] :)

## 2017-03-17 ENCOUNTER — Other Ambulatory Visit: Payer: Self-pay | Admitting: Internal Medicine

## 2017-03-28 ENCOUNTER — Ambulatory Visit (INDEPENDENT_AMBULATORY_CARE_PROVIDER_SITE_OTHER): Payer: Medicare Other | Admitting: Family Medicine

## 2017-03-28 ENCOUNTER — Encounter: Payer: Self-pay | Admitting: Family Medicine

## 2017-03-28 DIAGNOSIS — R05 Cough: Secondary | ICD-10-CM

## 2017-03-28 DIAGNOSIS — R059 Cough, unspecified: Secondary | ICD-10-CM

## 2017-03-28 DIAGNOSIS — I255 Ischemic cardiomyopathy: Secondary | ICD-10-CM

## 2017-03-28 DIAGNOSIS — M25519 Pain in unspecified shoulder: Secondary | ICD-10-CM

## 2017-03-28 MED ORDER — RANITIDINE HCL 150 MG PO TABS
150.0000 mg | ORAL_TABLET | Freq: Two times a day (BID) | ORAL | Status: DC | PRN
Start: 2017-03-28 — End: 2017-08-22

## 2017-03-28 NOTE — Progress Notes (Signed)
Coughing and sneezing recently but he feels better today.  Rhinorrhea. Cough is worse at night.  Sipping water helps.  Taking TUMS helps with cough.  Cough is worse laying down.  He hasn't been hoarse or having a ST.  No fevers.  He was prev on protonix w/o a lot of relief.    Still with cough and he didn't f/u with Allergy clinic in the meantime.  D/w pt.   He had f/u with GI, was given GERD diet but didn't stick with that.   D/w pt.  He admits to noncompliance.   Had been on fioricet prn for decades.  He usually takes #20 per month.  D/w pt about avoiding trigger foods in the meantime.   R shoulder pain with int/ext rotation and pain on supraspinatus testing.  He is putting up with it.  Some pain sleeping on R side at night.    ROS: Per HPI unless specifically indicated in ROS section   Meds, vitals, and allergies reviewed.   GEN: nad, alert and oriented HEENT: mucous membranes moist, tm w/o erythema, nasal exam w/o erythema, clear discharge noted,  OP with cobblestoning NECK: supple w/o LA CV: IRR but not tachy.  SEM noted.   PULM: ctab, no inc wob EXT: no edema SKIN: no acute rash  R shoulder pain with int/ext rotation and pain on supraspinatus testing.  +scap assist.

## 2017-03-28 NOTE — Patient Instructions (Addendum)
Avoid chocolate, caffeine, etc.  Elevate the head of your bed.  Try zantac 150mg  twice a day and take TUMS as needed.  See if that helps the cough.   Let me know if/when you want to go to PT for your right shoulder.   Take care.  Glad to see you.  Update me as needed.

## 2017-03-29 DIAGNOSIS — M25519 Pain in unspecified shoulder: Secondary | ICD-10-CM | POA: Insufficient documentation

## 2017-03-29 NOTE — Assessment & Plan Note (Signed)
Avoid chocolate, caffeine, etc.  Elevate the head of your bed.  Try zantac 150mg  twice a day and take TUMS as needed.  See if that helps the cough.  Update me as needed.  He agrees.   Ctab.  Nontoxic.  No reason to suspect infectious process now.

## 2017-03-29 NOTE — Assessment & Plan Note (Signed)
Likely from rotator cuff, he declined PT.

## 2017-05-04 ENCOUNTER — Telehealth: Payer: Self-pay | Admitting: *Deleted

## 2017-05-04 DIAGNOSIS — H01025 Squamous blepharitis left lower eyelid: Secondary | ICD-10-CM | POA: Diagnosis not present

## 2017-05-04 DIAGNOSIS — H01021 Squamous blepharitis right upper eyelid: Secondary | ICD-10-CM | POA: Diagnosis not present

## 2017-05-04 DIAGNOSIS — M25519 Pain in unspecified shoulder: Secondary | ICD-10-CM

## 2017-05-04 DIAGNOSIS — H01024 Squamous blepharitis left upper eyelid: Secondary | ICD-10-CM | POA: Diagnosis not present

## 2017-05-04 DIAGNOSIS — H01022 Squamous blepharitis right lower eyelid: Secondary | ICD-10-CM | POA: Diagnosis not present

## 2017-05-04 NOTE — Telephone Encounter (Signed)
Patient is here with wife who is transferring from Dr. Deborra Medina and says Rehab was offered to him at his last OV with Dr. Damita Dunnings for rotator cuff issues.  Patient would like to get PT please.

## 2017-05-05 NOTE — Addendum Note (Signed)
Addended by: Tonia Ghent on: 05/05/2017 05:31 AM   Modules accepted: Orders

## 2017-05-05 NOTE — Telephone Encounter (Signed)
Ordered. Thanks

## 2017-05-18 DIAGNOSIS — R531 Weakness: Secondary | ICD-10-CM | POA: Diagnosis not present

## 2017-05-18 DIAGNOSIS — M25512 Pain in left shoulder: Secondary | ICD-10-CM | POA: Diagnosis not present

## 2017-05-18 DIAGNOSIS — M25511 Pain in right shoulder: Secondary | ICD-10-CM | POA: Diagnosis not present

## 2017-05-30 DIAGNOSIS — M25511 Pain in right shoulder: Secondary | ICD-10-CM | POA: Diagnosis not present

## 2017-05-30 DIAGNOSIS — M25512 Pain in left shoulder: Secondary | ICD-10-CM | POA: Diagnosis not present

## 2017-05-30 DIAGNOSIS — R531 Weakness: Secondary | ICD-10-CM | POA: Diagnosis not present

## 2017-06-15 DIAGNOSIS — M25512 Pain in left shoulder: Secondary | ICD-10-CM | POA: Diagnosis not present

## 2017-06-15 DIAGNOSIS — M25511 Pain in right shoulder: Secondary | ICD-10-CM | POA: Diagnosis not present

## 2017-06-22 ENCOUNTER — Encounter: Payer: Self-pay | Admitting: *Deleted

## 2017-06-26 ENCOUNTER — Ambulatory Visit: Payer: Medicare Other | Admitting: Internal Medicine

## 2017-06-26 ENCOUNTER — Encounter: Payer: Self-pay | Admitting: Internal Medicine

## 2017-07-14 DIAGNOSIS — H01024 Squamous blepharitis left upper eyelid: Secondary | ICD-10-CM | POA: Diagnosis not present

## 2017-07-14 DIAGNOSIS — H01022 Squamous blepharitis right lower eyelid: Secondary | ICD-10-CM | POA: Diagnosis not present

## 2017-07-14 DIAGNOSIS — H01025 Squamous blepharitis left lower eyelid: Secondary | ICD-10-CM | POA: Diagnosis not present

## 2017-07-14 DIAGNOSIS — H01021 Squamous blepharitis right upper eyelid: Secondary | ICD-10-CM | POA: Diagnosis not present

## 2017-07-17 ENCOUNTER — Ambulatory Visit: Payer: Medicare Other | Admitting: Internal Medicine

## 2017-07-21 ENCOUNTER — Ambulatory Visit (INDEPENDENT_AMBULATORY_CARE_PROVIDER_SITE_OTHER)
Admission: RE | Admit: 2017-07-21 | Discharge: 2017-07-21 | Disposition: A | Discharge status: Home / Self Care | Payer: Medicare Other | Source: Ambulatory Visit | Attending: Family Medicine | Admitting: Family Medicine

## 2017-07-21 ENCOUNTER — Encounter: Payer: Self-pay | Admitting: Family Medicine

## 2017-07-21 ENCOUNTER — Ambulatory Visit (INDEPENDENT_AMBULATORY_CARE_PROVIDER_SITE_OTHER): Payer: Medicare Other | Admitting: Family Medicine

## 2017-07-21 ENCOUNTER — Ambulatory Visit
Admission: RE | Admit: 2017-07-21 | Discharge: 2017-07-21 | Disposition: A | Discharge status: Home / Self Care | Payer: Medicare Other | Source: Ambulatory Visit | Attending: Family Medicine | Admitting: Family Medicine

## 2017-07-21 VITALS — BP 110/80 | HR 89 | Temp 98.3°F | Wt 199.5 lb

## 2017-07-21 DIAGNOSIS — M25512 Pain in left shoulder: Secondary | ICD-10-CM

## 2017-07-21 DIAGNOSIS — M19011 Primary osteoarthritis, right shoulder: Secondary | ICD-10-CM | POA: Diagnosis not present

## 2017-07-21 DIAGNOSIS — M25511 Pain in right shoulder: Secondary | ICD-10-CM

## 2017-07-21 DIAGNOSIS — M19012 Primary osteoarthritis, left shoulder: Secondary | ICD-10-CM | POA: Diagnosis not present

## 2017-07-21 MED ORDER — TRAMADOL HCL 50 MG PO TABS: 50.0000 mg | ORAL_TABLET | Freq: Two times a day (BID) | ORAL | 0 refills | Status: DC | PRN

## 2017-07-21 NOTE — Progress Notes (Signed)
B shoulder pain. He had been advised not to take nsaids.  He tried going to PT, but it didn't help.  He can do some activity about the house, raking, some shoveling, mowing w/o troubles.  He is having trouble sleeping on his side at night.  Sig pain laying on the R side and the L side.  He even dreams that his arms hurt.  The R shoulder is likely worse per patient report.  Discomfort with overhead movement.  Currently treated with Eliquis.  Meds, vitals, and allergies reviewed.   ROS: Per HPI unless specifically indicated in ROS section   nad ncat Neck supple, no LA IRR, not tachy Ctab B shoulder exams similar but R>L discomfort on ext > int rotation.  He has weakness with B supraspinatus testing pos w/o arm drop on either side.  Distally NV intact BUE.

## 2017-07-21 NOTE — Patient Instructions (Addendum)
Go to the lab on the way out.  We'll contact you with your xray report. Use tramadol if needed for pain.  Sedation caution.  Let me talk to [place] in the meantime.  Take care.  Glad to see you.

## 2017-07-23 NOTE — Assessment & Plan Note (Signed)
See notes on imaging.  He likely does have rotator cuff pathology.  Discussed with patient about options.  Likely reasonable to image initially and then I will see if he is a candidate for injection given that he is currently treated with Eliquis.  He can try tramadol in the meantime with routine cautions.  He agrees.

## 2017-07-24 ENCOUNTER — Encounter: Payer: Self-pay | Admitting: Family Medicine

## 2017-07-31 ENCOUNTER — Encounter: Payer: Self-pay | Admitting: Family Medicine

## 2017-07-31 ENCOUNTER — Ambulatory Visit (INDEPENDENT_AMBULATORY_CARE_PROVIDER_SITE_OTHER): Payer: Medicare Other | Admitting: Family Medicine

## 2017-07-31 VITALS — BP 120/70 | HR 95 | Temp 98.1°F | Ht 71.0 in | Wt 200.5 lb

## 2017-07-31 DIAGNOSIS — M25512 Pain in left shoulder: Secondary | ICD-10-CM

## 2017-07-31 DIAGNOSIS — M25511 Pain in right shoulder: Secondary | ICD-10-CM | POA: Diagnosis not present

## 2017-07-31 DIAGNOSIS — M7582 Other shoulder lesions, left shoulder: Secondary | ICD-10-CM

## 2017-07-31 DIAGNOSIS — M19012 Primary osteoarthritis, left shoulder: Secondary | ICD-10-CM | POA: Diagnosis not present

## 2017-07-31 DIAGNOSIS — M7581 Other shoulder lesions, right shoulder: Secondary | ICD-10-CM | POA: Diagnosis not present

## 2017-07-31 DIAGNOSIS — M19011 Primary osteoarthritis, right shoulder: Secondary | ICD-10-CM | POA: Diagnosis not present

## 2017-07-31 MED ORDER — METHYLPREDNISOLONE ACETATE 40 MG/ML IJ SUSP
40.0000 mg | Freq: Once | INTRAMUSCULAR | Status: AC
  Administered 2017-07-31: 40 mg via INTRA_ARTICULAR

## 2017-07-31 MED ORDER — METHYLPREDNISOLONE ACETATE 40 MG/ML IJ SUSP
40.0000 mg | Freq: Once | INTRAMUSCULAR | Status: AC
Start: 2017-07-31 — End: 2017-07-31
  Administered 2017-07-31: 40 mg via INTRA_ARTICULAR

## 2017-07-31 NOTE — Progress Notes (Signed)
Dr. Frederico Hamman T. Therin Vetsch, MD, [facility] [place], [address] Phone: [phone] Fax: [phone]  07/31/2017  Patient: [name] [name], MRN: [id number], DOB: 1934-05-21, 83 y.o.  Primary Physician:  Tonia Ghent, MD   Chief Complaint  Patient presents with  . Shoulder Pain   Subjective:   [name] [name] is a 83 y.o. very pleasant male patient who presents with the following:  83 year old gentleman who was kindly referred by Dr. Damita Dunnings for bilateral shoulder pain.  He has been having pain in his shoulders for approximately 6 months.  He is on Eliquis for atrial fibrillation.  He does avoid NSAIDs.  He is having quite a lot of pain in a T-shirt distribution at night in both shoulders, and he does have full range of motion in both shoulders.  The right shoulder is approximately 50% worse than the other.  He has been to formal physical therapy, and he has also done some home rehabilitation.  He does not recall any specific injury, but at baseline he is very active and does things around the house and in the yard routinely and on a regular basis.  Prior to this time, he does not really think that he had any significant shoulder pain.  Both shoulder films have been independently reviewed by myself.  The right shoulder is most significant for a moderate amount of glenohumeral arthritis, seen most strikingly inferiorly.  There is also a mild to moderate amount of acromioclavicular osteoarthritis as well.  There is also some calcific rotator cuff tendinopathy, likely in the supraspinatus.  On the left shoulder, to a lesser extent there is some glenohumeral arthritis.  Less compared to the right shoulder.  There is also some mild acromioclavicular arthritis.  Additionally as with the right, there is some calcification at the supraspinatus, but less compared to the right shoulder.  There is no fracture or  dislocation. Electronically Signed  By: Owens Loffler, MD On: 08/01/2017 2:17 PM   Past Medical History, Surgical History, Social History, Family History, Problem List, Medications, and Allergies have been reviewed and updated if relevant.  Patient Active Problem List   Diagnosis Date Noted  . Shoulder pain 03/29/2017  . CAD (coronary artery disease), autologous vein bypass graft 10/31/2016  . Family history of colon cancer 10/31/2016  . History of adenomatous polyp of colon 10/31/2016  . Osteoarthritis 10/31/2016  . Atrial fibrillation ([place]) 10/31/2016  . Syncope 02/11/2016  . High risk medication use   . Skin lesion 06/04/2015  . Loss of weight 06/04/2015  . Lower back pain 05/01/2015  . Elevated TSH 12/26/2014  . Back pain 01/22/2014  . Aortic stenosis 05/16/2013  . Bladder neoplasm 11/23/2012  . BCC (basal cell carcinoma of skin) 08/29/2012  . Vertigo 08/24/2012  . GERD (gastroesophageal reflux disease) 05/01/2012  . Hyperlipidemia 03/14/2012  . TMJ pain dysfunction syndrome 01/20/2012  . Toe laceration 12/06/2011  . CAD (coronary artery disease) 11/30/2011  . Long term (current) use of anticoagulants 08/31/2011  . RVOT ventricular tachycardia ([place])   . Cough 04/29/2009  . HEARING LOSS, SUDDEN 12/03/2007  . RHINITIS 12/03/2007  . Essential hypertension 02/22/2007  . PAF (paroxysmal atrial fibrillation) ([place]) 02/22/2007  . SYMPTOM, HEADACHE 02/22/2007    Past Medical History:  Diagnosis Date  . Anticoagulated on Coumadin    [facility]  . Aortic stenosis    a. Echo 2/15: Severe LVH, EF 55-60%, Gr  1 DD, mild to mod AS (mean 20 mmHg, peak 31 mmHg), AVA 1.4-1.5 cm2, mild AI, MAC, trivial MR, LA upper limits of normal, RVSP 22 mmHg, mild RAE;   b. Echo 2/16:  EF 55-60%, no RWMA, mild AS (mean 17 mmHg, peak 27 mmHg), AVA 1.5 cm2, mild AI, MAC, mild LAE  . Arthritis   . Bladder neoplasm   . Chronic cough    NO CARDIAC OR PULMONARY RELATED  .  Coronary artery disease CARDIOLOGIST-  DR KLEIN/ ALLRED   a. s/p CABG 2004, b. LHC with patent grafts 07/2005, ejection fraction 50%;  c. Myoview 2/15: no ischemia, EF 61%  . Dry eyes   . Dyslipidemia   . GERD (gastroesophageal reflux disease)   . HTN (hypertension)   . Mild aortic stenosis   . PAF (paroxysmal atrial fibrillation) ([place])    CHADS2-VASc:  4  . Paroxysmal atrial fibrillation (HCC)   . RVOT-VT (right ventricular outflow tract ventricular tachycardia) ([place])    a. Amiodarone Rx  . S/P CABG x 5 2004    Past Surgical History:  Procedure Laterality Date  . CARDIAC CATHETERIZATION  07-26-2005  DR GAMBLE   PRESERVED LVF/  EF 50%/ PATENT GRAFTS  . CARDIAC CATHETERIZATION  03-04-2003  DR GAMBLE   SEVERE 3 VESSEL DISEASE  . [place]   PAF/ FALSE POSITIVE STRESS TEST  . CATARACT EXTRACTION W/ INTRAOCULAR LENS IMPLANT Right   . CORONARY ARTERY BYPASS GRAFT  03-08-2003  DR [id number]   LIMA TO LAD/ SVG TO OM2/  SVG TO DIAGONAL / SVG TO PDA & PLA  . CYSTOSCOPY WITH BIOPSY N/A 12/25/2012   Procedure: CYSTOSCOPY WITH BLADDER BIOPSY;  Surgeon: Fredricka Bonine, MD;  Location: [facility];  Service: Urology;  Laterality: N/A;  . ELECTROPHYSIOLOGY STUDY  07-27-2005  DR Carleene Overlie TAYLOR   MAPPING --   RESULT NONINDUCIBLE VT OR SVT/  DX RIGHT VENTRICULAR OUTFLOW TRACT VT AND RULES OUT MORE MALIGNANT CAUSES OF VT  . INGUINAL HERNIA REPAIR Bilateral [place]  . KNEE ARTHROSCOPY Right 1994  . LUMBAR DISC SURGERY  1999   L4 -- L5  . REMOVAL VOCAL CORD POLYPS  1978  . TONSILLECTOMY  AS CHILD  . TRANSTHORACIC ECHOCARDIOGRAM  08-08-2011   MILD LVH/  EF 40-10%/  GRADE I DIASTOLIC DYSFUNCTION/ MILD AV STENOSIS    Social History   Socioeconomic History  . Marital status: Married    Spouse name: Not on file  . Number of children: 3  . Years of education: Not on file  . Highest education level: Not on file  Occupational History  .  Occupation: Retired  Scientific laboratory technician  . Financial resource strain: Not on file  . Food insecurity:    Worry: Not on file    Inability: Not on file  . Transportation needs:    Medical: Not on file    Non-medical: Not on file  Tobacco Use  . Smoking status: Former Smoker    Packs/day: 1.00    Years: 25.00    Pack years: 25.00    Types: Cigarettes    Last attempt to quit: 04/25/1980    Years since quitting: 37.2  . Smokeless tobacco: Never Used  Substance and Sexual Activity  . Alcohol use: No    Alcohol/week: 0.0 oz  . Drug use: No  . Sexual activity: Not on file  Lifestyle  . Physical activity:    Days per  week: Not on file    Minutes per session: Not on file  . Stress: Not on file  Relationships  . Social connections:    Talks on phone: Not on file    Gets together: Not on file    Attends religious service: Not on file    Active member of club or organization: Not on file    Attends meetings of clubs or organizations: Not on file    Relationship status: Not on file  . Intimate partner violence:    Fear of current or ex partner: Not on file    Emotionally abused: Not on file    Physically abused: Not on file    Forced sexual activity: Not on file  Other Topics Concern  . Not on file  Social History Narrative   [employer] '56-'58, overseas to [place]   Some care through [place]   No service disability    Family History  Problem Relation Age of Onset  . Cancer Mother     Allergies  Allergen Reactions  . Ace Inhibitors Other (See Comments)    COUGH  . Hycodan [Hydrocodone-Homatropine] Nausea And Vomiting  . Oxycodone Nausea Only    Pt states that he cannot take prescription pain medication  . Zocor [Simvastatin] Other (See Comments)    MYALGIA  . Cephalexin Rash    Medication list reviewed and updated in full in [place].  GEN: No fevers, chills. Nontoxic. Primarily MSK c/o today. MSK: Detailed in the HPI GI: tolerating PO intake without difficulty Neuro: No  numbness, parasthesias, or tingling associated. Otherwise the pertinent positives of the ROS are noted above.   Objective:   Vitals:   07/31/17 1402  BP: 120/70  Pulse: 95  Temp: 98.1 F (36.7 C)  TempSrc: Oral  Weight: 200 lb 8 oz (90.9 kg)  Height: _0  (1.803 m)     GEN: Well-developed,well-nourished,in no acute distress; alert,appropriate and cooperative throughout examination HEENT: Normocephalic and atraumatic without obvious abnormalities. Ears, externally no deformities PULM: Breathing comfortably in no respiratory distress EXT: No clubbing, cyanosis, or edema PSYCH: Normally interactive. Cooperative during the interview. Pleasant. Friendly and conversant. Not anxious or depressed appearing. Normal, full affect.  Shoulder: B Inspection: No muscle wasting or winging Ecchymosis/edema: neg  AC joint, scapula, clavicle: NT Cervical spine: NT, full ROM Spurling's: neg Abduction: full, 5/5 Flexion: full, 5/5 IR, full, lift-off: 5/5 ER at neutral: full, 5/5 AC crossover: neg Neer: neg Hawkins: pos, more on the right Drop Test: neg Empty Can: neg Supraspinatus insertion: mild-mod T Bicipital groove: NT Speed's: neg Yergason's: neg Sulcus sign: neg Scapular dyskinesis: none C5-T1 intact  Neuro: Sensation intact Grip 5/5   Radiology: Dg Shoulder Right  Result Date: 07/21/2017 CLINICAL DATA:  Bilateral shoulder pain. EXAM: RIGHT SHOULDER - 2+ VIEW COMPARISON:  Chest x-ray 08/26/2016. FINDINGS: Acromioclavicular and glenohumeral degenerative change. Calcific supraspinatus tendinosis. No acute bony abnormality. No evidence of fracture or dislocation. Prior CABG. IMPRESSION: Acromioclavicular and glenohumeral degenerative change. Calcific supraspinatus tendinosis. No acute abnormality. Electronically Signed   By: Marcello Moores  Register   On: 07/21/2017 11:44   Dg Shoulder Left  Result Date: 07/21/2017 CLINICAL DATA: Bilateral shoulder pain. EXAM: LEFT SHOULDER - 2+ VIEW  COMPARISON:  Chest x-ray 08/26/2016. FINDINGS: No acute bony or joint abnormality. No evidence of fracture dislocation. Acromioclavicular glenohumeral degenerative change. Calcification of the supraspinatus region consistent calcific supraspinatus tendinosis. Prior median CABG. IMPRESSION: Acromioclavicular glenohumeral degenerative change. Calcific supraspinatus tendinosis. No acute bony abnormality. Electronically Signed  By: [place]   On: 07/21/2017 11:42    Assessment and Plan:   Arthritis of both glenohumeral joints  Bilateral shoulder pain, unspecified chronicity - Plan: methylPREDNISolone acetate (DEPO-MEDROL) injection 40 mg, methylPREDNISolone acetate (DEPO-MEDROL) injection 40 mg, methylPREDNISolone acetate (DEPO-MEDROL) injection 40 mg, methylPREDNISolone acetate (DEPO-MEDROL) injection 40 mg  Rotator cuff tendonitis, right  Rotator cuff tendonitis, left  Suspect combined rotator cuff tendinopathy, and calcification may represent chronic overuse syndrome versus prior injury.  Certainly the patient has bilateral glenohumeral arthritis, and I think that this is likely contributing to his pain and pain at night.  On the right side, I think he also has some degree of subacromial bursitis.  I am going to have him continue activity, and will do subacromial as well as intra-articular injections on his shoulders today try to calm down his symptoms more acutely.  Intrarticular Shoulder Injection, RIGHT Verbal consent was obtained from the patient. Risks including infection explained and contrasted with benefits and alternatives. Patient prepped with Chloraprep and Ethyl Chloride used for anesthesia. An intraarticular shoulder injection was performed using the posterior approach. The patient tolerated the procedure well and had decreased pain post injection. No complications. Injection: 4 cc of Lidocaine 1% and 1 mL Depo-Medrol 40 mg. Needle: 22 gauge   SubAC Injection, RIGHT Verbal  consent was obtained from the patient. Risks (including rare infection), benefits, and alternatives were explained. Patient prepped with Chloraprep and Ethyl Chloride used for anesthesia. The subacromial space was injected using the posterior approach. The patient tolerated the procedure well and had decreased pain post injection. No complications. Injection: 4 cc of Lidocaine 1% and 1 mL of Depo-Medrol 40 mg. Needle: 22 gauge    Intrarticular Shoulder Injection, LEFT Verbal consent was obtained from the patient. Risks including infection explained and contrasted with benefits and alternatives. Patient prepped with Chloraprep and Ethyl Chloride used for anesthesia. An intraarticular shoulder injection was performed using the posterior approach. The patient tolerated the procedure well and had decreased pain post injection. No complications. Injection: 4 cc of Lidocaine 1% and 1 mL Depo-Medrol 40 mg. Needle: 22 gauge   SubAC Injection, LEFT Verbal consent was obtained from the patient. Risks (including rare infection), benefits, and alternatives were explained. Patient prepped with Chloraprep and Ethyl Chloride used for anesthesia. The subacromial space was injected using the posterior approach. The patient tolerated the procedure well and had decreased pain post injection. No complications. Injection: 4 cc of Lidocaine 1% and 1 mL of Depo-Medrol 40 mg. Needle: 22 gauge     Follow-up: 1 mo if persists  Signed,  Adriel Kessen T. Matalynn Graff, MD   Allergies as of 07/31/2017      Reactions   Ace Inhibitors Other (See Comments)   COUGH   Hycodan [hydrocodone-homatropine] Nausea And Vomiting   Oxycodone Nausea Only   Pt states that he cannot take prescription pain medication   Zocor [simvastatin] Other (See Comments)   MYALGIA   Cephalexin Rash      Medication List        Accurate as of 07/31/17 11:59 PM. Always use your most recent med list.          acetaminophen 325 MG tablet Commonly known  as:  TYLENOL Take 650 mg by mouth once as needed for moderate pain or headache.   apixaban 5 MG Tabs tablet Commonly known as:  ELIQUIS Take 1 tablet (5 mg total) by mouth 2 (two) times daily.   butalbital-acetaminophen-caffeine 50-325-40 MG tablet Commonly known as:  FIORICET, ESGIC Take 1 tablet by mouth once as needed for headache.   cholecalciferol 1000 units tablet Commonly known as:  VITAMIN D Take 1,000 Units by mouth daily.   diltiazem 120 MG 24 hr capsule Commonly known as:  CARDIZEM CD Take 1 capsule (120 mg) by mouth once daily   hydrochlorothiazide 25 MG tablet Commonly known as:  HYDRODIURIL TAKE 1 TABLET BY MOUTH DAILY IN THE MORNING   levocetirizine 5 MG tablet Commonly known as:  XYZAL Take 1 tablet (5 mg total) by mouth every evening.   potassium chloride 10 MEQ tablet Commonly known as:  K-DUR Take 10 mEq by mouth every morning. Take as directed   pravastatin 80 MG tablet Commonly known as:  PRAVACHOL Take 80 mg by mouth every evening.   ranitidine 150 MG tablet Commonly known as:  ZANTAC Take 1 tablet (150 mg total) by mouth 2 (two) times daily as needed for heartburn.   SYSTANE ULTRA OP Apply 1-2 drops to eye once as needed (dry eyes.).   traMADol 50 MG tablet Commonly known as:  ULTRAM Take 1 tablet (50 mg total) by mouth every 12 (twelve) hours as needed (for pain).

## 2017-08-22 ENCOUNTER — Encounter: Payer: Self-pay | Admitting: Internal Medicine

## 2017-08-22 ENCOUNTER — Ambulatory Visit (INDEPENDENT_AMBULATORY_CARE_PROVIDER_SITE_OTHER): Payer: Medicare Other | Admitting: Internal Medicine

## 2017-08-22 VITALS — BP 110/80 | HR 81 | Ht 71.0 in | Wt 195.0 lb

## 2017-08-22 DIAGNOSIS — I482 Chronic atrial fibrillation: Secondary | ICD-10-CM

## 2017-08-22 DIAGNOSIS — I4821 Permanent atrial fibrillation: Secondary | ICD-10-CM

## 2017-08-22 DIAGNOSIS — I255 Ischemic cardiomyopathy: Secondary | ICD-10-CM

## 2017-08-22 DIAGNOSIS — I350 Nonrheumatic aortic (valve) stenosis: Secondary | ICD-10-CM | POA: Diagnosis not present

## 2017-08-22 NOTE — Patient Instructions (Signed)
Medication Instructions:  Your physician recommends that you continue on your current medications as directed. Please refer to the Current Medication list given to you today.   Labwork: You will have labs drawn today: CBC and BMP   Testing/Procedures: None ordered.  Follow-Up: Your physician wants you to follow-up in: One Year with Dr Klein.  You will receive a reminder letter in the mail two months in advance. If you don't receive a letter, please call our office to schedule the follow-up appointment.   Any Other Special Instructions Will Be Listed Below (If Applicable).     If you need a refill on your cardiac medications before your next appointment, please call your pharmacy.  

## 2017-08-22 NOTE — Progress Notes (Signed)
Patient Care Team: Tonia Ghent, MD as PCP - General (Family Medicine)   HPI  [name] [name] is a 83 y.o. male Seen in followup for atrial fibrillation and VT been treated with amiodarone. Thromboembolic risk factors are notable for a chads vas score of 3.  He is taking apixoban which she is tolerating without issues..  He has also had recurrent syncope-post micturition.  He underwent monitoring after initial visit to clarify the presence of atrial fibrillation.This prompted reinitiation of his warfarin and his amiodarone. He had  seen Dr. Rayann Heman to consider catheter ablation but decided that he would continue on amiodarone.  He has been on it for some time  He has a history of ischemic heart disease with prior bypass surgery 2004 Catheterization 2007 demonstrated patent grafts  He has had a problem with a cough. His ARB was changed from cozaaar >> benicar last year.    Date DLCO  12/13 83 % pred  1/18 74% pred    Echocardiogram 2/16 demonstrated mild aortic stenosis with a mean gradient of 17 ; ejection fraction was 55-60%. DATE TEST    2/16    Echo   EF 55-60 % AS mild Gradient 17                 Date TSH LFTs PFTs  1/15  4.17     11/17  3.63 11      He is doing relatively well. He has noted some irregularity in heart beat over the last weeks. There've been no change in exercise tolerance. He's had no edema and no chest pain. He continues to complain of a chronic cough.      Past Medical History:  Diagnosis Date  . Anticoagulated on Coumadin    [facility]RR [facility]  . Aortic stenosis    a. Echo 2/15: Severe LVH, EF 55-60%, Gr 1 DD, mild to mod AS (mean 20 mmHg, peak 31 mmHg), AVA 1.4-1.5 cm2, mild AI, MAC, trivial MR, LA upper limits of normal, RVSP 22 mmHg, mild RAE;   b. Echo 2/16:  EF 55-60%, no RWMA, mild AS (mean 17 mmHg, peak 27 mmHg), AVA 1.5 cm2, mild AI, MAC, mild LAE  . Arthritis   . Bladder neoplasm   . Chronic cough    NO  CARDIAC OR PULMONARY RELATED  . Coronary artery disease CARDIOLOGIST-  DR KLEIN/ ALLRED   a. s/p CABG 2004, b. LHC with patent grafts 07/2005, ejection fraction 50%;  c. Myoview 2/15: no ischemia, EF 61%  . Dry eyes   . Dyslipidemia   . GERD (gastroesophageal reflux disease)   . HTN (hypertension)   . Mild aortic stenosis   . PAF (paroxysmal atrial fibrillation) ([place])    CHADS2-VASc:  4  . Paroxysmal atrial fibrillation (HCC)   . RVOT-VT (right ventricular outflow tract ventricular tachycardia) ([place])    a. Amiodarone Rx  . S/P CABG x 5 2004    Past Surgical History:  Procedure Laterality Date  . CARDIAC CATHETERIZATION  07-26-2005  DR GAMBLE   PRESERVED LVF/  EF 50%/ PATENT GRAFTS  . CARDIAC CATHETERIZATION  03-04-2003  DR GAMBLE   SEVERE 3 VESSEL DISEASE  . [place]   PAF/ FALSE POSITIVE STRESS TEST  . CATARACT EXTRACTION W/ INTRAOCULAR LENS IMPLANT Right   . CORONARY ARTERY BYPASS GRAFT  03-08-2003  DR [id number]   LIMA TO LAD/ SVG TO OM2/  SVG  TO DIAGONAL / SVG TO PDA & PLA  . CYSTOSCOPY WITH BIOPSY N/A 12/25/2012   Procedure: CYSTOSCOPY WITH BLADDER BIOPSY;  Surgeon: Fredricka Bonine, MD;  Location[facility];  Service: Urology;  Laterality: N/A;  . ELECTROPHYSIOLOGY STUDY  07-27-2005  DR Carleene Overlie TAYLOR   MAPPING --   RESULT NONINDUCIBLE VT OR SVT/  DX RIGHT VENTRICULAR OUTFLOW TRACT VT AND RULES OUT MORE MALIGNANT CAUSES OF VT  . INGUINAL HERNIA REPAIR Bilateral [place]  . KNEE ARTHROSCOPY Right 1994  . LUMBAR DISC SURGERY  1999   L4 -- L5  . REMOVAL VOCAL CORD POLYPS  1978  . TONSILLECTOMY  AS CHILD  . TRANSTHORACIC ECHOCARDIOGRAM  08-08-2011   MILD LVH/  EF 41-96%/  GRADE I DIASTOLIC DYSFUNCTION/ MILD AV STENOSIS    Current Outpatient Medications  Medication Sig Dispense Refill  . acetaminophen (TYLENOL) 325 MG tablet Take 650 mg by mouth once as needed for moderate pain or headache.    Marland Kitchen apixaban (ELIQUIS) 5  MG TABS tablet Take 1 tablet (5 mg total) by mouth 2 (two) times daily. 180 tablet 3  . butalbital-acetaminophen-caffeine (FIORICET, ESGIC) 50-325-40 MG per tablet Take 1 tablet by mouth once as needed for headache.     . cholecalciferol (VITAMIN D) 1000 UNITS tablet Take 1,000 Units by mouth daily.    Marland Kitchen diltiazem (CARDIZEM CD) 120 MG 24 hr capsule Take 1 capsule (120 mg) by mouth once daily    . hydrochlorothiazide (HYDRODIURIL) 25 MG tablet TAKE 1 TABLET BY MOUTH DAILY IN THE MORNING 90 tablet 1  . levocetirizine (XYZAL) 5 MG tablet Take 1 tablet (5 mg total) by mouth every evening. 30 tablet 1  . Polyethyl Glycol-Propyl Glycol (SYSTANE ULTRA OP) Apply 1-2 drops to eye once as needed (dry eyes.).     Marland Kitchen potassium chloride (K-DUR) 10 MEQ tablet Take 10 mEq by mouth every morning. Take as directed    . pravastatin (PRAVACHOL) 80 MG tablet Take 80 mg by mouth every evening.      No current facility-administered medications for this visit.     Allergies  Allergen Reactions  . Ace Inhibitors Other (See Comments)    COUGH  . Hycodan [Hydrocodone-Homatropine] Nausea And Vomiting  . Oxycodone Nausea Only    Pt states that he cannot take prescription pain medication  . Zocor [Simvastatin] Other (See Comments)    MYALGIA  . Cephalexin Rash    Review of Systems negative except from HPI and PMH  Physical Exam BP 110/80   Pulse 81   Ht 5' 11"  (1.803 m)   Wt 195 lb (88.5 kg)   SpO2 97%   BMI 27.20 kg/m  Well developed and nourished in no acute distress HENT normal Neck supple with JVP-flat Clear Irregularly irregular rate and rhythm with controlled ventricular response, no murmurs or gallops Abd-soft with active BS without hepatomegaly No Clubbing cyanosis edema Skin-warm and dry A & Oriented  Grossly normal sensory and motor function   ECG demonstrated atrial fibrillation 86 Intervals-/11/39      Assessment and  Plan  Aortic stenosis  Mild  Atrial fibrillation-Permanent    Hypertension  Ischemic heart disease  S/p CABG  With near normal LV function  syncope    BP well controlled  No syncope  Without symptoms of ischemia  On Anticoagulation;  No bleeding issues   Check Hgb  He asked regarding pravastatin.  I did a quick literature search.  There are  data up to 81 years old without calcium scoring.  We have reviewed the role of calcium scoring and risk stratification and the need for statins.  He has decided to go ahead and stay on his statin.    We spent more than 50% of our >25 min visit in face to face counseling regarding the above

## 2017-08-23 ENCOUNTER — Telehealth: Payer: Self-pay

## 2017-08-23 LAB — BASIC METABOLIC PANEL
BUN/Creatinine Ratio: 20 (ref 10–24)
BUN: 20 mg/dL (ref 8–27)
CALCIUM: 9.6 mg/dL (ref 8.6–10.2)
CO2: 26 mmol/L (ref 20–29)
CREATININE: 0.99 mg/dL (ref 0.76–1.27)
Chloride: 101 mmol/L (ref 96–106)
GFR calc non Af Amer: 72 mL/min/{1.73_m2} (ref >59)
GFR, EST AFRICAN AMERICAN: 83 mL/min/{1.73_m2} (ref >59)
Glucose: 91 mg/dL (ref 65–99)
Potassium: 4 mmol/L (ref 3.5–5.2)
Sodium: 143 mmol/L (ref 134–144)

## 2017-08-23 LAB — CBC WITH DIFFERENTIAL/PLATELET
Basophils Absolute: 0 10*3/uL (ref 0.0–0.2)
Basos: 0 %
EOS (ABSOLUTE): 0.1 10*3/uL (ref 0.0–0.4)
EOS: 1 %
HEMATOCRIT: 41.8 % (ref 37.5–51.0)
HEMOGLOBIN: 14.6 g/dL (ref 13.0–17.7)
IMMATURE GRANS (ABS): 0 10*3/uL (ref 0.0–0.1)
Immature Granulocytes: 0 %
LYMPHS: 31 %
Lymphocytes Absolute: 1.9 10*3/uL (ref 0.7–3.1)
MCH: 32.1 pg (ref 26.6–33.0)
MCHC: 34.9 g/dL (ref 31.5–35.7)
MCV: 92 fL (ref 79–97)
MONOCYTES: 8 %
Monocytes Absolute: 0.5 10*3/uL (ref 0.1–0.9)
NEUTROS ABS: 3.5 10*3/uL (ref 1.4–7.0)
Neutrophils: 60 %
Platelets: 188 10*3/uL (ref 150–379)
RBC: 4.55 x10E6/uL (ref 4.14–5.80)
RDW: 14.8 % (ref 12.3–15.4)
WBC: 5.9 10*3/uL (ref 3.4–10.8)

## 2017-08-23 NOTE — Telephone Encounter (Signed)
-----   Message from Deboraha Sprang, MD sent at 08/23/2017 10:19 AM EDT ----- Please Inform Patient that labs are normal x   Thanks

## 2017-08-23 NOTE — Telephone Encounter (Signed)
Pt is aware and agreeable to normal results Results also released to MyChart per patient request

## 2017-08-25 DIAGNOSIS — H01025 Squamous blepharitis left lower eyelid: Secondary | ICD-10-CM | POA: Diagnosis not present

## 2017-08-25 DIAGNOSIS — H01024 Squamous blepharitis left upper eyelid: Secondary | ICD-10-CM | POA: Diagnosis not present

## 2017-08-25 DIAGNOSIS — H2512 Age-related nuclear cataract, left eye: Secondary | ICD-10-CM | POA: Diagnosis not present

## 2017-08-25 DIAGNOSIS — Z961 Presence of intraocular lens: Secondary | ICD-10-CM | POA: Diagnosis not present

## 2017-08-25 DIAGNOSIS — H01021 Squamous blepharitis right upper eyelid: Secondary | ICD-10-CM | POA: Diagnosis not present

## 2017-08-25 DIAGNOSIS — H35373 Puckering of macula, bilateral: Secondary | ICD-10-CM | POA: Diagnosis not present

## 2017-08-25 DIAGNOSIS — H01022 Squamous blepharitis right lower eyelid: Secondary | ICD-10-CM | POA: Diagnosis not present

## 2017-08-30 IMAGING — MR MR LUMBAR SPINE W/O CM
5 series · 41 of 48 positions shown · non-contrast
Comparison: Radiographs 05/06/2015. Abdominal pelvic CT 11/08/2013.

CLINICAL DATA: 80-year-old with low back pain extending into the
left groin and leg for 1 month. Worsening symptoms after shoveling
snow last week. No previous relevant surgery.

EXAM:
MRI LUMBAR SPINE WITHOUT CONTRAST
TECHNIQUE: Multiplanar, multisequence MR imaging of the lumbar spine was
performed. No intravenous contrast was administered.

[Series 3: tirm sag · sagittal · 4.0mm · 0.55mm/px · 6 of 13 slices shown]
[im 1/13]
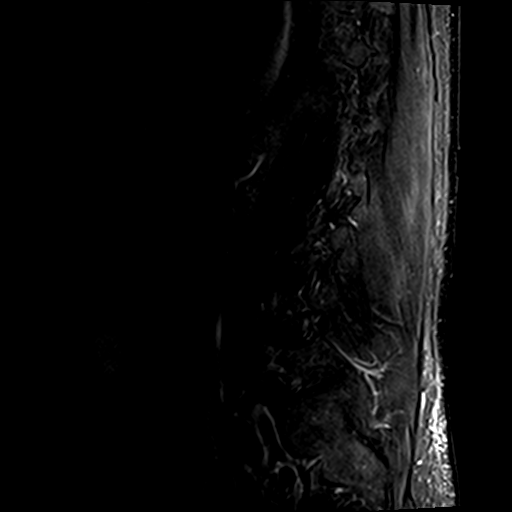
[im 3/13]
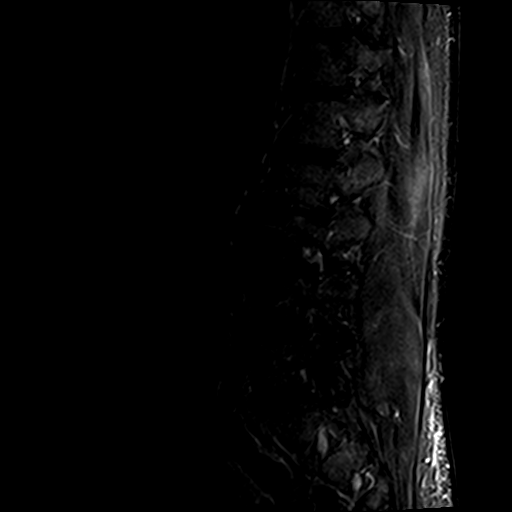
[im 5/13]
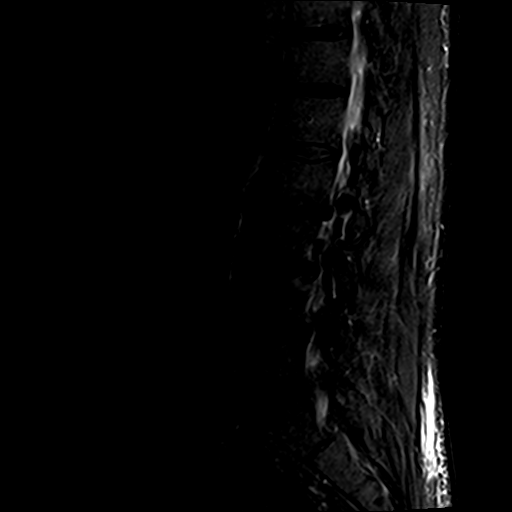
[im 8/13]
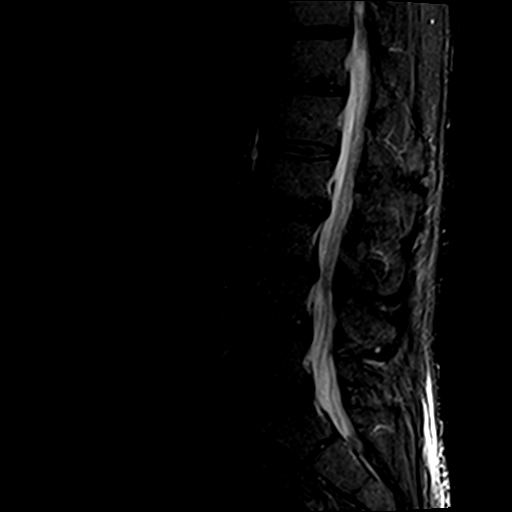
[im 10/13]
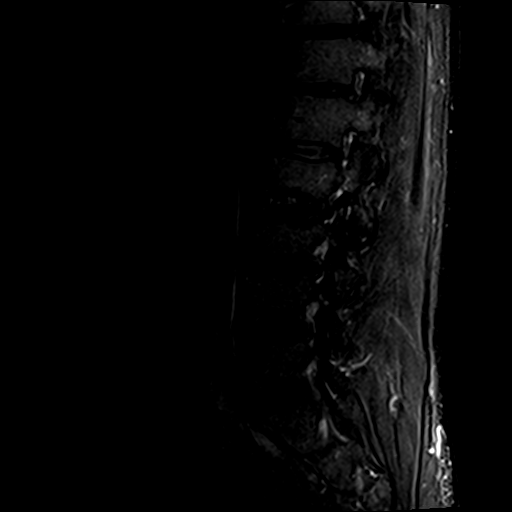
[im 13/13]
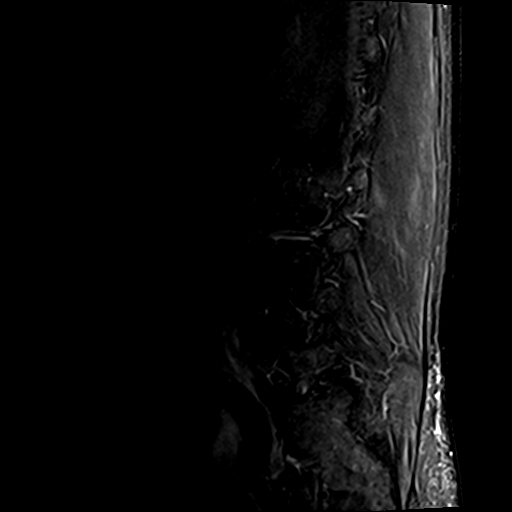

[Series 4: T2 · sagittal · 4.0mm · 0.88mm/px · 6 of 13 slices shown (1 of 2)]
[im 1/13]
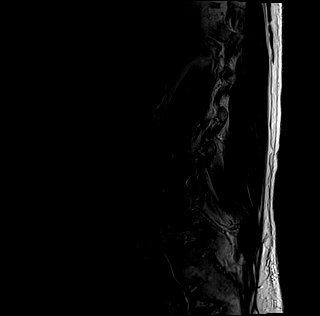
[im 3/13]
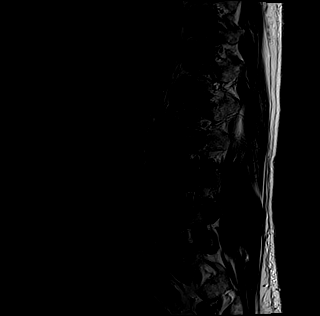
[im 5/13]
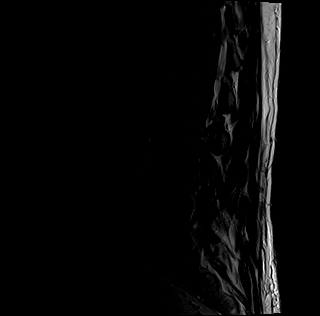
[im 8/13]
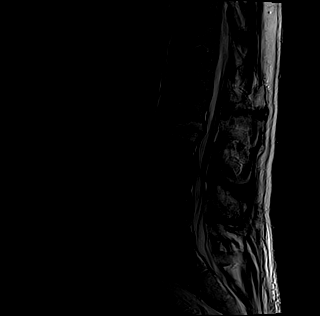
[im 10/13]
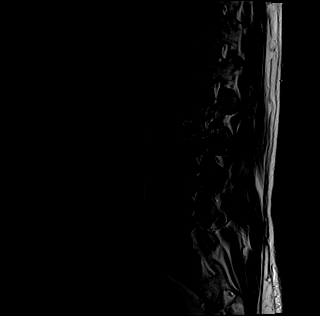
[im 13/13]
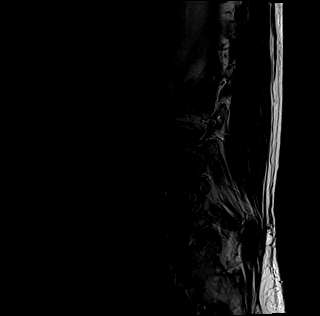

[Series 5: T1 · sagittal · 4.0mm · 0.88mm/px · 6 of 13 slices shown (1 of 2)]
[im 1/13]
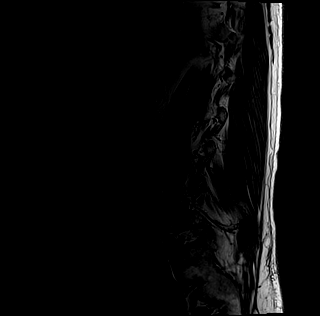
[im 3/13]
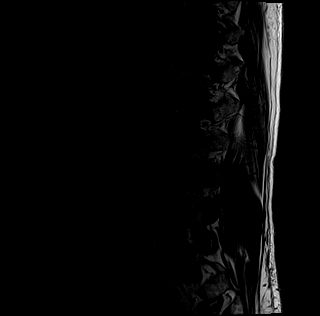
[im 5/13]
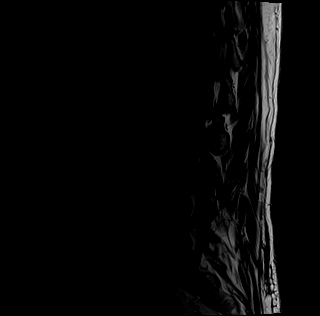
[im 8/13]
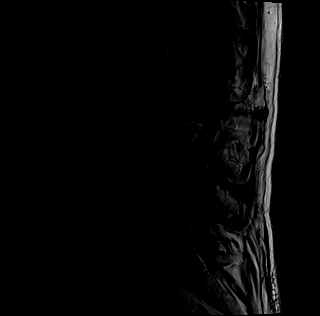
[im 10/13]
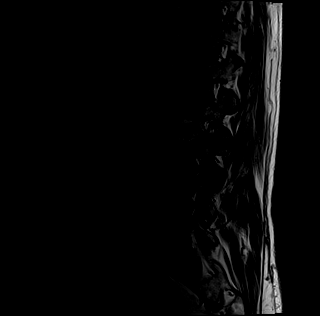
[im 13/13]
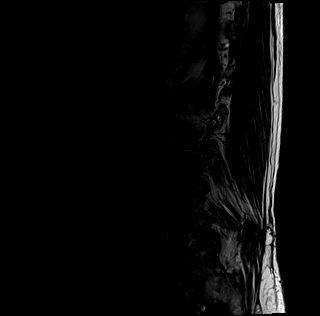

[Series 6: T1 · axial · 4.0mm · 0.70mm/px · z∈[-22,+151]mm · 9 of 31 slices shown (2 of 2)]
[im 1/31]
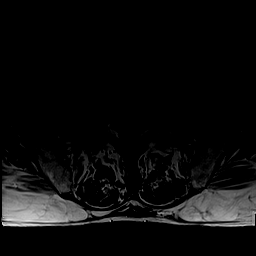
[im 5/31]
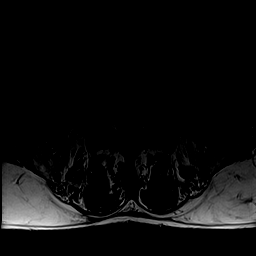
[im 9/31]
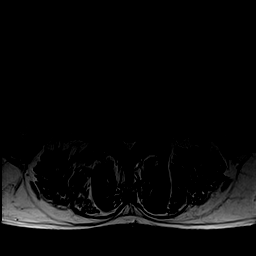
[im 13/31]
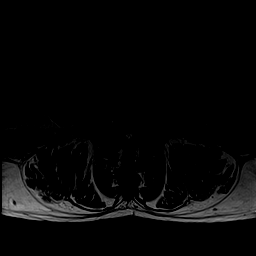
[im 16/31]
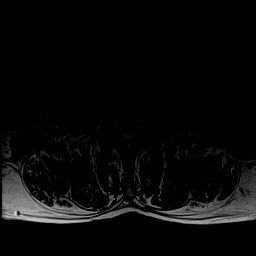
[im 18/31]
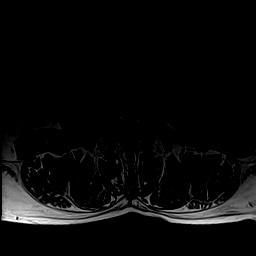
[im 22/31]
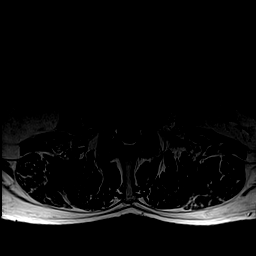
[im 26/31]
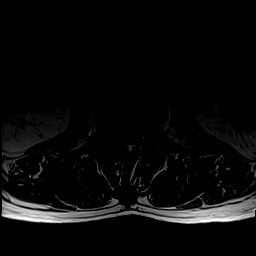
[im 31/31]
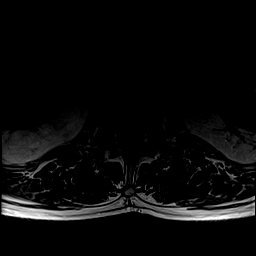

[Series 7: T2 · axial · 4.0mm · 0.70mm/px · z∈[-22,+151]mm · 14 of 31 slices shown (2 of 2)]
[im 1/31]
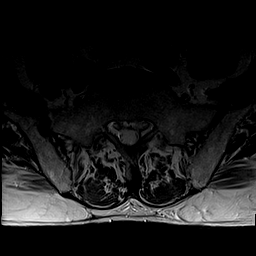
[im 3/31]
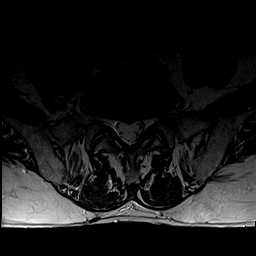
[im 5/31]
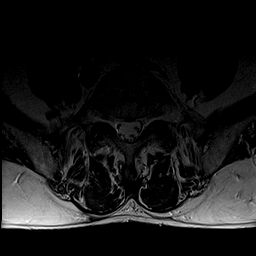
[im 7/31]
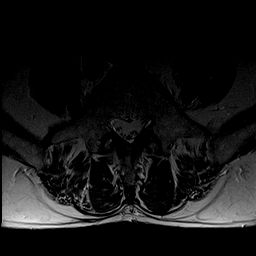
[im 9/31]
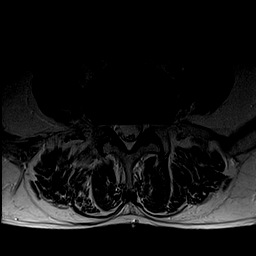
[im 11/31]
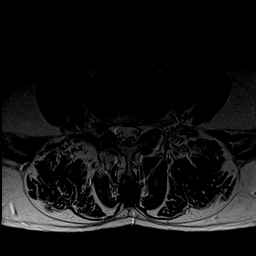
[im 13/31]
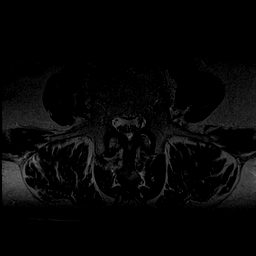
[im 16/31]
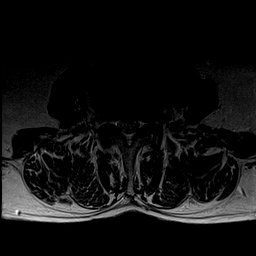
[im 18/31]
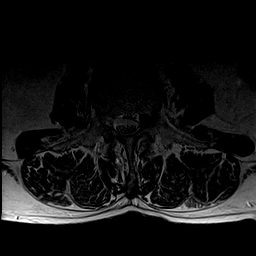
[im 20/31]
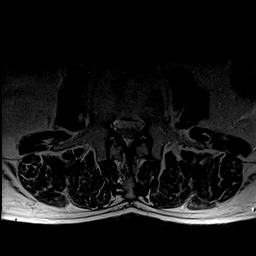
[im 22/31]
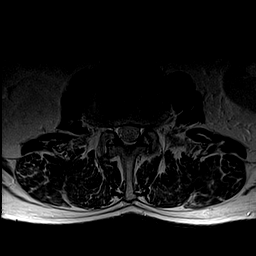
[im 24/31]
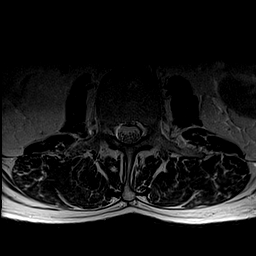
[im 26/31]
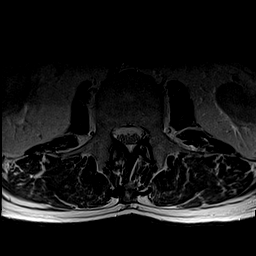
[im 31/31]
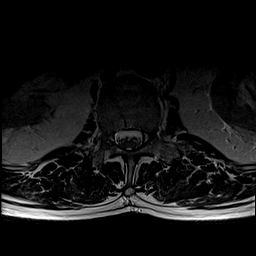

[41 of 48 positions shown; findings below may reference images not displayed]

FINDINGS: Segmentation: Conventional anatomy assumed, with the last open disc
space designated L5-S1.

Alignment:  Normal.

Bones: No worrisome osseous lesion, acute fracture or pars defect.
There are scattered endplate degenerative changes, greatest at L2-3.
The lumbar pedicles are short on a congenital basis.

Conus medullaris: Extends to the L1-2 level and appears normal.

Paraspinal and other soft tissues: No significant paraspinal
findings.

Disc levels:

No significant disc space findings from T11-12 through L1-2.

L2-3: Chronic degenerative disc disease with loss of disc height,
annular disc bulging, paraspinal osteophytes and endplate
degeneration. There is mild facet and ligamentous hypertrophy. These
factors contribute to borderline spinal stenosis which appears
unchanged. There is mild foraminal narrowing without nerve root
encroachment.

L3-4: There is mild loss of disc height with annular disc bulging
and a broad-based foraminal and extraforaminal disc protrusion on
the left. There is a superimposed focal disc extrusion with cephalad
extension of disc material into the left L3 lateral recess (axial
images 14 and 15). There is resulting left L3 nerve root
encroachment. There is underlying mild multifactorial spinal
stenosis.

L4-5: Mild disc bulging eccentric to the right with mild facet and
ligamentous hypertrophy. There is mild inferior foraminal narrowing
on the right without definite nerve root encroachment. The spinal
canal is adequately patent.

L5-S1: Mild disc bulging with mild facet and ligamentous
hypertrophy. No significant spinal stenosis or nerve root
encroachment.
IMPRESSION: 1. The most significant findings are at L3-4 where there is a left
foraminal disc extrusion with cephalad extension of disc material
and left L3 nerve root encroachment. There is underlying mild
multifactorial spinal stenosis at that level.
2. Chronic degenerative disc disease at L2-3 with loss of disc
height and paraspinal osteophytes. No nerve root encroachment.
3. Mild disc bulging, facet and ligamentous hypertrophy at L4-5 and
L5-S1 without significant resulting spinal stenosis or nerve root
encroachment.

## 2017-09-07 ENCOUNTER — Other Ambulatory Visit: Payer: Self-pay

## 2017-09-07 DIAGNOSIS — L821 Other seborrheic keratosis: Secondary | ICD-10-CM | POA: Diagnosis not present

## 2017-09-07 DIAGNOSIS — D1801 Hemangioma of skin and subcutaneous tissue: Secondary | ICD-10-CM | POA: Diagnosis not present

## 2017-09-07 DIAGNOSIS — L905 Scar conditions and fibrosis of skin: Secondary | ICD-10-CM | POA: Diagnosis not present

## 2017-09-07 DIAGNOSIS — Z85828 Personal history of other malignant neoplasm of skin: Secondary | ICD-10-CM | POA: Diagnosis not present

## 2017-09-07 DIAGNOSIS — L570 Actinic keratosis: Secondary | ICD-10-CM | POA: Diagnosis not present

## 2017-09-07 DIAGNOSIS — C4441 Basal cell carcinoma of skin of scalp and neck: Secondary | ICD-10-CM | POA: Diagnosis not present

## 2017-09-07 DIAGNOSIS — D485 Neoplasm of uncertain behavior of skin: Secondary | ICD-10-CM | POA: Diagnosis not present

## 2017-09-14 DIAGNOSIS — H268 Other specified cataract: Secondary | ICD-10-CM | POA: Diagnosis not present

## 2017-09-14 DIAGNOSIS — H2512 Age-related nuclear cataract, left eye: Secondary | ICD-10-CM | POA: Diagnosis not present

## 2017-09-19 ENCOUNTER — Other Ambulatory Visit: Payer: Self-pay | Admitting: Internal Medicine

## 2017-09-21 DIAGNOSIS — C4441 Basal cell carcinoma of skin of scalp and neck: Secondary | ICD-10-CM | POA: Diagnosis not present

## 2017-10-06 ENCOUNTER — Ambulatory Visit (INDEPENDENT_AMBULATORY_CARE_PROVIDER_SITE_OTHER): Payer: Medicare Other | Admitting: Family Medicine

## 2017-10-06 ENCOUNTER — Encounter: Payer: Self-pay | Admitting: Family Medicine

## 2017-10-06 VITALS — BP 118/72 | HR 91 | Temp 98.3°F | Ht 71.0 in | Wt 195.8 lb

## 2017-10-06 DIAGNOSIS — R202 Paresthesia of skin: Secondary | ICD-10-CM

## 2017-10-06 DIAGNOSIS — M7989 Other specified soft tissue disorders: Secondary | ICD-10-CM

## 2017-10-06 DIAGNOSIS — G629 Polyneuropathy, unspecified: Secondary | ICD-10-CM | POA: Diagnosis not present

## 2017-10-06 DIAGNOSIS — I255 Ischemic cardiomyopathy: Secondary | ICD-10-CM

## 2017-10-06 LAB — VITAMIN B12: Vitamin B-12: 359 pg/mL (ref 211–911)

## 2017-10-06 LAB — TSH: TSH: 2.96 u[IU]/mL (ref 0.35–4.50)

## 2017-10-06 NOTE — Patient Instructions (Signed)
Go to the lab on the way out.  We'll contact you with your lab report. If you need a dermatology referral, then let me know.  Try to keep your feet elevated.  Take care.  Glad to see you.

## 2017-10-06 NOTE — Progress Notes (Signed)
He is taking guaifenesin 400mg  BID.  He isn't taking decongestants.  D/w pt.    Foot swelling.  Some days with swelling, but not every day.  Only on the R foot.  He has varicose veins on the R foot.  Going on episodically for a few years.  He has been using compression stockings at night with some relief.  He can have some pain and tingling episodically with the swelling.  No claudication.  Already on eliquis.  He has h/o vein harvesting in the R leg and that may/likely contribute.    He had seen derm and is going to see Dr. Levada Dy about a lesion on his neck.  I'll defer.    Meds, vitals, and allergies reviewed.   ROS: Per HPI unless specifically indicated in ROS section   nad ncat Neck supple with scabbed lesion on the R side of the neck.  No LA rrr ctab abd soft, not ttp Ext with normal B DP pulses.  No edema.  He has B but R>L foot varicose veins.  Dec sensation in B feet, dec with monofilament and vibration-he has normal sensation with monofilament and vibration on the hands bilaterally.   Normal sensation to light touch on the ext x4.

## 2017-10-07 DIAGNOSIS — M7989 Other specified soft tissue disorders: Secondary | ICD-10-CM | POA: Insufficient documentation

## 2017-10-07 DIAGNOSIS — G629 Polyneuropathy, unspecified: Secondary | ICD-10-CM | POA: Insufficient documentation

## 2017-10-07 NOTE — Assessment & Plan Note (Signed)
On the right foot.  The swelling is been going on episodically for a few years.  He has history of bypass with previous vein harvesting from the right leg.  This likely contributes.  He also has some varicose veins noted on the right greater than left foot.  There is no reason to suspect DVT at this point.  I think he likely has multifactorial issues with previous vein harvesting and likely incompetence of the remaining veins.  Simple elevation is likely the best intervention.  He has no skin breakdown.  Normal pulses.  Update me as needed.

## 2017-10-07 NOTE — Assessment & Plan Note (Signed)
Unclear source.  No motor deficit.  No foot drop.  Discussed with patient about broad differential.   He is not diabetic.  Reasonable to check TSH and B12.  He could be that he has idiopathic neuropathy.  See notes on labs.  He will update me if his symptoms are worsening.  Routine cautions given.

## 2017-10-11 DIAGNOSIS — C4441 Basal cell carcinoma of skin of scalp and neck: Secondary | ICD-10-CM | POA: Diagnosis not present

## 2017-10-11 DIAGNOSIS — D485 Neoplasm of uncertain behavior of skin: Secondary | ICD-10-CM | POA: Diagnosis not present

## 2017-10-12 ENCOUNTER — Encounter: Payer: Self-pay | Admitting: Internal Medicine

## 2017-10-16 DIAGNOSIS — H01022 Squamous blepharitis right lower eyelid: Secondary | ICD-10-CM | POA: Diagnosis not present

## 2017-10-16 DIAGNOSIS — S0502XA Injury of conjunctiva and corneal abrasion without foreign body, left eye, initial encounter: Secondary | ICD-10-CM | POA: Diagnosis not present

## 2017-10-16 DIAGNOSIS — H01021 Squamous blepharitis right upper eyelid: Secondary | ICD-10-CM | POA: Diagnosis not present

## 2017-10-16 DIAGNOSIS — H01024 Squamous blepharitis left upper eyelid: Secondary | ICD-10-CM | POA: Diagnosis not present

## 2017-10-16 DIAGNOSIS — H01025 Squamous blepharitis left lower eyelid: Secondary | ICD-10-CM | POA: Diagnosis not present

## 2017-11-29 ENCOUNTER — Encounter: Payer: Self-pay | Admitting: Family Medicine

## 2017-11-29 DIAGNOSIS — L570 Actinic keratosis: Secondary | ICD-10-CM | POA: Diagnosis not present

## 2017-11-29 DIAGNOSIS — C4441 Basal cell carcinoma of skin of scalp and neck: Secondary | ICD-10-CM | POA: Diagnosis not present

## 2017-12-26 ENCOUNTER — Ambulatory Visit (INDEPENDENT_AMBULATORY_CARE_PROVIDER_SITE_OTHER): Payer: Medicare Other | Admitting: Family Medicine

## 2017-12-26 ENCOUNTER — Encounter: Payer: Self-pay | Admitting: Family Medicine

## 2017-12-26 DIAGNOSIS — I255 Ischemic cardiomyopathy: Secondary | ICD-10-CM | POA: Diagnosis not present

## 2017-12-26 DIAGNOSIS — M5430 Sciatica, unspecified side: Secondary | ICD-10-CM

## 2017-12-26 MED ORDER — PREDNISONE 20 MG PO TABS: ORAL_TABLET | ORAL | 0 refills | Status: DC

## 2017-12-26 NOTE — Patient Instructions (Signed)
Start prednisone with food and update me if not better.  We can get you set up with Dr. [facility] if needed.  Take care.  Glad to see you.

## 2017-12-26 NOTE — Progress Notes (Signed)
R sided back pain and sciatica.  Started a few days ago.  No trauma.  No L sided sx.  Moves down the R leg.  More pain standing than sitting, esp if putting more weight on the R side.  Can still weight, but with pain, ie no weakness. No B/B sx.  H/o similar sciatica in the past.  On anticoagulation.  Intolerant of oxycodone. H/o back surgery per Dr. Ellene Route.  No fevers.   He has been able to tolerate prednisone prev.    Meds, vitals, and allergies reviewed.   ROS: Per HPI unless specifically indicated in ROS section   nad ncat IRR, not tachy.   ctab S/S BLE grossly intact.  Able to bear weight.   R SLR positive.   Back not ttp in midline.   No rash.   Skin well perfused.  No BLE edema.

## 2017-12-28 DIAGNOSIS — M5430 Sciatica, unspecified side: Secondary | ICD-10-CM | POA: Insufficient documentation

## 2017-12-28 NOTE — Assessment & Plan Note (Signed)
Still okay for outpatient follow-up.  Diagnosis and plan discussed with patient.  All questions answered.  He agrees with plan. Start prednisone with food and update me if not better.  We can get you him up with Dr. [facility] if needed.  Imaging would likely not change the plan at this point.  He agreed and understood.

## 2018-01-02 ENCOUNTER — Encounter: Payer: Self-pay | Admitting: Family Medicine

## 2018-01-02 ENCOUNTER — Telehealth: Payer: Self-pay | Admitting: *Deleted

## 2018-01-02 DIAGNOSIS — N200 Calculus of kidney: Secondary | ICD-10-CM | POA: Diagnosis not present

## 2018-01-02 DIAGNOSIS — R3911 Hesitancy of micturition: Secondary | ICD-10-CM | POA: Diagnosis not present

## 2018-01-02 DIAGNOSIS — R3915 Urgency of urination: Secondary | ICD-10-CM | POA: Diagnosis not present

## 2018-01-02 DIAGNOSIS — N401 Enlarged prostate with lower urinary tract symptoms: Secondary | ICD-10-CM | POA: Diagnosis not present

## 2018-01-02 NOTE — Telephone Encounter (Signed)
Erroneous encounter

## 2018-01-04 ENCOUNTER — Ambulatory Visit (INDEPENDENT_AMBULATORY_CARE_PROVIDER_SITE_OTHER)
Admission: RE | Admit: 2018-01-04 | Discharge: 2018-01-04 | Disposition: A | Discharge status: Home / Self Care | Payer: Medicare Other | Source: Ambulatory Visit | Attending: Family Medicine | Admitting: Family Medicine

## 2018-01-04 ENCOUNTER — Encounter: Payer: Self-pay | Admitting: Family Medicine

## 2018-01-04 ENCOUNTER — Ambulatory Visit (INDEPENDENT_AMBULATORY_CARE_PROVIDER_SITE_OTHER): Payer: Medicare Other | Admitting: Family Medicine

## 2018-01-04 VITALS — BP 138/84 | HR 102 | Temp 98.4°F | Ht 71.0 in | Wt 195.5 lb

## 2018-01-04 DIAGNOSIS — M549 Dorsalgia, unspecified: Secondary | ICD-10-CM

## 2018-01-04 DIAGNOSIS — M25571 Pain in right ankle and joints of right foot: Secondary | ICD-10-CM | POA: Diagnosis not present

## 2018-01-04 DIAGNOSIS — I255 Ischemic cardiomyopathy: Secondary | ICD-10-CM

## 2018-01-04 DIAGNOSIS — Z23 Encounter for immunization: Secondary | ICD-10-CM | POA: Diagnosis not present

## 2018-01-04 DIAGNOSIS — M25579 Pain in unspecified ankle and joints of unspecified foot: Secondary | ICD-10-CM

## 2018-01-04 DIAGNOSIS — M47816 Spondylosis without myelopathy or radiculopathy, lumbar region: Secondary | ICD-10-CM | POA: Diagnosis not present

## 2018-01-04 NOTE — Progress Notes (Signed)
Flu shot done today.  Prednisone done as of this AM.   Sx similar to prev, but not as bad with R sided back pain and sciatica.   Now with more R ankle pain when weight bearing.  He had longstanding ankle trouble and prev wore a brace years ago with playing tennis.  Limping from pain.  Episodic R ankle swelling but not now.   Numbness near the R ankle but not on the R foot.    No L ankle sx.   Meds, vitals, and allergies reviewed.   ROS: Per HPI unless specifically indicated in ROS section   nad ncat Neck supple, no LA IRR, not tachy ctab Normal DP pulses   Weaker R foot dorsiflexion.  Normal R plantar flexion.  Normal dorsiflexion and plantar flexion on the left foot. Dec monofilament in R ankle but not distally.   R ankle mortise stable, medial and lateral malleoli not tender. Tight hamstring but R SLR neg.   Dec DTRs in the ext x4, symmetric.

## 2018-01-04 NOTE — Assessment & Plan Note (Signed)
Patient may have 2 separate issues going on.  He could have sciatica is causing some numbness in the leg.  Straight leg raise is negative today.  His lower back pain is better overall.  He does have some mild weakness on right foot dorsiflexion.  He does not have a noticeable foot drop with gait.  He could have a separate issue with arthritic changes in the right ankle.  Check lower back films today along with right ankle films.  See notes on imaging.  Discussed with patient.  He agrees with plan.

## 2018-01-04 NOTE — Patient Instructions (Addendum)
Go to the lab on the way out.  We'll contact you with your xray report. Let me get the images reviewed and we'll go from there but you'll likely need to see Dr. Ellene Route again.  Take care.  Glad to see you.

## 2018-01-05 ENCOUNTER — Encounter: Payer: Self-pay | Admitting: Family Medicine

## 2018-01-05 LAB — LAB REPORT - SCANNED
ALT: 18
AST: 19
CREATININE: 1.01
Cholesterol: 158
HDL: 65
Hemoglobin: 14.4
LDL CALC: 74
Platelets: 209
Potassium: 3.9
TRIGLYCERIDES: 95
TSH: 2.02
WBC: 6.07

## 2018-01-08 ENCOUNTER — Encounter: Payer: Self-pay | Admitting: Family Medicine

## 2018-01-09 ENCOUNTER — Encounter: Payer: Self-pay | Admitting: Family Medicine

## 2018-01-10 ENCOUNTER — Other Ambulatory Visit: Payer: Self-pay | Admitting: Family Medicine

## 2018-01-10 DIAGNOSIS — M549 Dorsalgia, unspecified: Secondary | ICD-10-CM

## 2018-01-16 ENCOUNTER — Ambulatory Visit (INDEPENDENT_AMBULATORY_CARE_PROVIDER_SITE_OTHER): Payer: Medicare Other | Admitting: *Deleted

## 2018-01-16 DIAGNOSIS — I255 Ischemic cardiomyopathy: Secondary | ICD-10-CM

## 2018-01-16 DIAGNOSIS — I482 Chronic atrial fibrillation: Secondary | ICD-10-CM | POA: Diagnosis not present

## 2018-01-16 NOTE — Progress Notes (Signed)
Pt was started on Eliquis 5mg  twice a day for AFIB on 06/15/16 by Dr. Caryl Comes.    Reviewed patients medication list.  Pt is not currently on any combined P-gp and strong CYP3A4 inhibitors/inducers (ketoconazole, traconazole, ritonavir, carbamazepine, phenytoin, rifampin, St. John's wort).  Reviewed labs, which were done at the [facility] on 12/20/17. Labs have been scanned into system by PCP: SCr-1.01, Weight-89.5kg (197 lbs), Hgb-14.4; Dose appropriate based on age, weight, and SCr.  The labs did not have a HCT, spoke with [place] PharmD and she stated that pt did not need to go to the lab for this. Pt is aware that he will need labs drawn annually or as needed for dosing criteria and he is agreeable to the plan.   A full discussion of the nature of anticoagulants has been carried out.  A benefit/risk analysis has been presented to the patient, so that they understand the justification for choosing anticoagulation with Eliquis at this time.  The need for compliance is stressed.  Pt is aware to take the medication twice daily.  Side effects of potential bleeding are discussed, including unusual colored urine or stools, coughing up blood or coffee ground emesis, nose bleeds or serious fall or head trauma.  Discussed signs and symptoms of stroke. The patient should avoid any OTC items containing aspirin or ibuprofen.  Avoid alcohol consumption.   Call if any signs of abnormal bleeding.  Discussed financial obligations and resolved any difficulty in obtaining medication.  Pt aware to call with any issues or questions.

## 2018-01-31 DIAGNOSIS — M21371 Foot drop, right foot: Secondary | ICD-10-CM | POA: Diagnosis not present

## 2018-01-31 DIAGNOSIS — M5416 Radiculopathy, lumbar region: Secondary | ICD-10-CM | POA: Diagnosis not present

## 2018-02-01 ENCOUNTER — Other Ambulatory Visit: Payer: Self-pay | Admitting: Neurological Surgery

## 2018-02-01 DIAGNOSIS — M21371 Foot drop, right foot: Secondary | ICD-10-CM

## 2018-02-11 ENCOUNTER — Ambulatory Visit
Admission: RE | Admit: 2018-02-11 | Discharge: 2018-02-11 | Disposition: A | Discharge status: Home / Self Care | Payer: Medicare Other | Source: Ambulatory Visit | Attending: Neurological Surgery | Admitting: Neurological Surgery

## 2018-02-11 DIAGNOSIS — M21371 Foot drop, right foot: Secondary | ICD-10-CM

## 2018-02-11 DIAGNOSIS — M5116 Intervertebral disc disorders with radiculopathy, lumbar region: Secondary | ICD-10-CM | POA: Diagnosis not present

## 2018-02-11 MED ORDER — GADOBENATE DIMEGLUMINE 529 MG/ML IV SOLN
18.0000 mL | Freq: Once | INTRAVENOUS | Status: AC | PRN
  Administered 2018-02-11: 18 mL via INTRAVENOUS

## 2018-02-15 DIAGNOSIS — M5416 Radiculopathy, lumbar region: Secondary | ICD-10-CM | POA: Diagnosis not present

## 2018-02-27 ENCOUNTER — Encounter: Payer: Self-pay | Admitting: Family Medicine

## 2018-02-27 NOTE — Addendum Note (Signed)
Addended by: Josetta Huddle on: 02/27/2018 11:42 AM   Modules accepted: Orders

## 2018-03-15 ENCOUNTER — Other Ambulatory Visit: Payer: Self-pay

## 2018-03-30 ENCOUNTER — Ambulatory Visit: Payer: Medicare Other | Admitting: Family Medicine

## 2018-04-02 ENCOUNTER — Encounter: Payer: Self-pay | Admitting: Family Medicine

## 2018-04-02 ENCOUNTER — Ambulatory Visit (INDEPENDENT_AMBULATORY_CARE_PROVIDER_SITE_OTHER): Payer: Medicare Other | Admitting: Family Medicine

## 2018-04-02 DIAGNOSIS — I255 Ischemic cardiomyopathy: Secondary | ICD-10-CM | POA: Diagnosis not present

## 2018-04-02 DIAGNOSIS — J069 Acute upper respiratory infection, unspecified: Secondary | ICD-10-CM | POA: Diagnosis not present

## 2018-04-02 MED ORDER — DOXYCYCLINE HYCLATE 100 MG PO TABS: 100.0000 mg | ORAL_TABLET | Freq: Two times a day (BID) | ORAL | 0 refills | Status: DC

## 2018-04-02 NOTE — Progress Notes (Signed)
duration of symptoms: more cough recently with some sputum.  Sputum production is a change. Sneezing for weeks.   Rhinorrhea: yes, more in the AM.  He has a sore/scab in the R nostril with bloody rhinorrhea.   Congestion: yes ear pain: no sore throat:no cough:yes Myalgias: no Fevers: no And he had a since episode of L epistaxis.    Epistaxis cautions d/w pt.   Per HPI unless specifically indicated in ROS section  Meds, vitals, and allergies reviewed.   GEN: nad, alert and oriented HEENT: mucous membranes moist, TM w/o erythema, nasal epithelium injected, OP with cobblestoning NECK: supple w/o LA CV: IRR. PULM: ctab, no inc wob ABD: soft, +bs EXT: no edema Right nostril with dried blood/scab noted.

## 2018-04-02 NOTE — Patient Instructions (Signed)
Blow you nose gently.  Start doxycycline.   Rest and fluids.  Take care.  Glad to see you.

## 2018-04-04 DIAGNOSIS — J069 Acute upper respiratory infection, unspecified: Secondary | ICD-10-CM | POA: Insufficient documentation

## 2018-04-04 NOTE — Assessment & Plan Note (Signed)
Nontoxic.  Okay for outpatient follow-up.  Start doxycycline.  Rest and fluids.  Cautions given regarding epistaxis.  Update [place] as needed.  He agrees.

## 2018-04-11 DIAGNOSIS — H01022 Squamous blepharitis right lower eyelid: Secondary | ICD-10-CM | POA: Diagnosis not present

## 2018-04-11 DIAGNOSIS — H01024 Squamous blepharitis left upper eyelid: Secondary | ICD-10-CM | POA: Diagnosis not present

## 2018-04-11 DIAGNOSIS — H35373 Puckering of macula, bilateral: Secondary | ICD-10-CM | POA: Diagnosis not present

## 2018-04-11 DIAGNOSIS — H01025 Squamous blepharitis left lower eyelid: Secondary | ICD-10-CM | POA: Diagnosis not present

## 2018-04-11 DIAGNOSIS — H01021 Squamous blepharitis right upper eyelid: Secondary | ICD-10-CM | POA: Diagnosis not present

## 2018-04-11 DIAGNOSIS — Z961 Presence of intraocular lens: Secondary | ICD-10-CM | POA: Diagnosis not present

## 2018-04-11 DIAGNOSIS — H43813 Vitreous degeneration, bilateral: Secondary | ICD-10-CM | POA: Diagnosis not present

## 2018-04-25 ENCOUNTER — Encounter: Payer: Self-pay | Admitting: Family Medicine

## 2018-04-27 NOTE — Telephone Encounter (Signed)
Pt called office due to being seen on 12/9 by Dr.Duncan. Pt was prescribed the Doxycycline and he has finished the prescription. Pt states he is feeling worse. Please see below the mychart message he sent to Mildred. Please advise if pt needs to be reevaluated.

## 2018-04-29 ENCOUNTER — Encounter: Payer: Self-pay | Admitting: Family Medicine

## 2018-04-30 ENCOUNTER — Other Ambulatory Visit: Payer: Self-pay | Admitting: Family Medicine

## 2018-04-30 ENCOUNTER — Encounter: Payer: Self-pay | Admitting: Family Medicine

## 2018-04-30 ENCOUNTER — Telehealth: Payer: Self-pay | Admitting: Family Medicine

## 2018-04-30 NOTE — Telephone Encounter (Signed)
Patient understands and will keep the appointment tomorrow.

## 2018-04-30 NOTE — Telephone Encounter (Signed)
Please call pt.  I have been flooded with messages since coming back from [place].   I am not happy about the delay in response but I have been working though them as quickly as I could.   I was going to send him a mychart message today.  I updated his chart about doxycycline.  If he is worse in the meantime then I would have him keep the OV as scheduled for tomorrow.   It is likely best to recheck him and go from there.   Thanks.

## 2018-04-30 NOTE — Telephone Encounter (Signed)
Pt called back and set up appt for 05/01/18. He is requesting a cb to advise and cancel if you can send in med from last OV.

## 2018-05-01 ENCOUNTER — Encounter: Payer: Self-pay | Admitting: Family Medicine

## 2018-05-01 ENCOUNTER — Ambulatory Visit (INDEPENDENT_AMBULATORY_CARE_PROVIDER_SITE_OTHER): Payer: Medicare Other | Admitting: Family Medicine

## 2018-05-01 DIAGNOSIS — J310 Chronic rhinitis: Secondary | ICD-10-CM | POA: Diagnosis not present

## 2018-05-01 MED ORDER — FLUTICASONE PROPIONATE 50 MCG/ACT NA SUSP: 1.0000 | Freq: Every day | NASAL | Status: DC

## 2018-05-01 NOTE — Patient Instructions (Signed)
Use flonase 1 spray per nostril per day.  Gently use a little neosporin on the irritated spot in your nose.   If this will not heal over then let me know and we'll set you up with the ENT clinic.  Take care.  Glad to see you.

## 2018-05-01 NOTE — Progress Notes (Signed)
He was prev treated with doxy, had abd pain and nausea.  Chart previously updated.  He doesn't have any nasal bleeding now.  Still with irritation on the R nostril.  Still with rhinorrhea. Some throat clearing and cough.  No chest pain.  Sneezing.  No fevers.  Some sputum, clear. Some fatigue.  He can fall asleep more easily now.  Still sleeping "decently but not good" at night.  No known OSA.    He had sent a message to the clinic electronically.  We were trying to work through messages as quickly as we could but we were backed up with schedule changes and increased patient demand over the holidays.  I apologized.  He was gracious about it and accept my apology.  Per HPI unless specifically indicated in ROS section  Meds, vitals, and allergies reviewed.   GEN: nad, alert and oriented HEENT: mucous membranes moist, TM w/o erythema, nasal epithelium injected, OP with cobblestoning NECK: supple w/o LA CV: IRR PULM: ctab, no inc wob ABD: soft, +bs EXT: no edema Medial distal R nostril with superficial irritation but no abscess seen. Sinuses not ttp

## 2018-05-02 NOTE — Assessment & Plan Note (Signed)
With local irritation.  Discussed options.  Would avoid oral antibiotics at this point since his sinuses not tender.  No fevers.  Nontoxic and okay for outpatient follow-up.  Would use flonase 1 spray per nostril per day.  Gently use a little neosporin on the irritated spot in the nose.  If this will not heal over then he will let me know and we'll set up ENT eval.  He agrees.

## 2018-05-08 ENCOUNTER — Telehealth: Payer: Self-pay | Admitting: *Deleted

## 2018-05-08 NOTE — Telephone Encounter (Signed)
   Primary Cardiologist:Steven Caryl Comes, MD  Chart reviewed as part of pre-operative protocol coverage. Because of Jabril Pursell [place]'s past medical history and time since last visit, he/she will require a follow-up visit in order to better assess preoperative cardiovascular risk.  Pre-op covering staff: - Please schedule appointment with EP APP  and call patient to inform them. - Please contact requesting surgeon's office via preferred method (i.e, phone, fax) to inform them of need for appointment prior to surgery.  Will route to pharmacy to review anticoagulation.   [place], [place]  05/08/2018, 3:44 PM

## 2018-05-08 NOTE — Telephone Encounter (Signed)
   [facility] Pre-operative Risk Assessment    Request for surgical clearance:  1. What type of surgery is being performed? COLONOSCOPY   2. When is this surgery scheduled? 05/29/18   3. What type of clearance is required (medical clearance vs. Pharmacy clearance to hold med vs. Both)? BOTH  4. Are there any medications that need to be held prior to surgery and how long?ELIQUIS   5. Practice name and name of physician performing surgery? [facility]; DR. JEFFREY MEDOFF   6. What is your office phone number [phone]    7.   What is your office fax number [phone]  8.   Anesthesia type (None, local, MAC, general) ? PROPOFOL? CALLED SURGEON'S OFFICE TO CONFIRM THOUGH NO ONE EVER ANSWERED   Julaine Hua 05/08/2018, 10:11 AM  _________________________________________________________________   (provider comments below)

## 2018-05-08 NOTE — Telephone Encounter (Signed)
Will route to EP Scheduler, Melissa, and she will contact the pt to get the appointment set up.

## 2018-05-08 NOTE — Telephone Encounter (Signed)
Patient with diagnosis of Afib on Eliquis for anticoagulation.    Procedure: colonoscopy Date of procedure: 05/29/18  CHADS2-VASc score of  4 (CHF, HTN, AGE, DM2, stroke/tia x 2, CAD, AGE, male)  CrCl 29ml/min  Per office protocol, patient can hold Eliquis for 1-2 days prior to procedure.

## 2018-05-09 ENCOUNTER — Encounter: Payer: Self-pay | Admitting: Family Medicine

## 2018-05-09 NOTE — Telephone Encounter (Signed)
IF no opening with EP APP then okay with general APP prior to surgery.

## 2018-05-09 NOTE — Telephone Encounter (Signed)
SPOKE WITH PT AND PT  WAS SCHEDULED WITH AMBER SIELER  ON 05-24-18 AT 9 AM.

## 2018-05-11 ENCOUNTER — Other Ambulatory Visit: Payer: Self-pay | Admitting: Family Medicine

## 2018-05-11 DIAGNOSIS — J310 Chronic rhinitis: Secondary | ICD-10-CM

## 2018-05-16 ENCOUNTER — Encounter: Payer: Self-pay | Admitting: Family Medicine

## 2018-05-17 ENCOUNTER — Telehealth: Payer: Self-pay | Admitting: *Deleted

## 2018-05-17 NOTE — Telephone Encounter (Signed)
   [facility] Pre-operative Risk Assessment    Request for surgical clearance:  1. What type of surgery is being performed? COLONOSCOPY    2. When is this surgery scheduled? 05/29/18   3. What type of clearance is required (medical clearance vs. Pharmacy clearance to hold med vs. Both)? BOTH  4. Are there any medications that need to be held prior to surgery and how long?ELIQUIS   5. Practice name and name of physician performing surgery? [facility]; DR. JEFFREY MEDOFF   6. What is your office phone number [phone]    7.   What is your office fax number [phone]  8.   Anesthesia type (None, local, MAC, general) ? PROPOFOL ?    Julaine Hua 05/17/2018, 11:05 AM  _________________________________________________________________   (provider comments below)

## 2018-05-17 NOTE — Telephone Encounter (Signed)
Patient with diagnosis of afib on eliquis for anticoagulation.    Procedure: colonoscopy Date of procedure: 05/29/2018  CHADS2-VASc score of  4 (CHF, HTN, AGE, DM2, stroke/tia x 2, CAD, AGE, male)  Per office protocol, patient can hold eliquis for 1 days prior to procedure.

## 2018-05-18 NOTE — Telephone Encounter (Signed)
Pt has an OV 05/24/2018 for clearance.  Kerin Ransom PA-C 05/18/2018 2:18 PM

## 2018-05-22 DIAGNOSIS — J310 Chronic rhinitis: Secondary | ICD-10-CM | POA: Insufficient documentation

## 2018-05-22 DIAGNOSIS — K219 Gastro-esophageal reflux disease without esophagitis: Secondary | ICD-10-CM | POA: Insufficient documentation

## 2018-05-23 NOTE — Progress Notes (Signed)
Electrophysiology Office Note Date: 05/24/2018  ID:  [name], [name] 09-Mar-1935, MRN [id number]  PCP: Tonia Ghent, MD Electrophysiologist: Caryl Comes  CC: surgical clearance  [name] [name] is a 84 y.o. male seen today for Dr Caryl Comes.  He presents today for routine electrophysiology followup.  Since last being seen in our clinic, the patient reports doing very well. He is scheduled for his screening colonoscopy and needs clearance.  He had one episode of 12 hours of "rust" colored urine that resolved spontaneously. There was no associated pain.  He follows regularly with urology. He denies chest pain, palpitations, dyspnea, PND, orthopnea, nausea, vomiting, dizziness, syncope, edema, weight gain, or early satiety.  Past Medical History:  Diagnosis Date  . Anticoagulated on Coumadin    followed in [facility] at [facility]  . Aortic stenosis    a. Echo 2/15: Severe LVH, EF 55-60%, Gr 1 DD, mild to mod AS (mean 20 mmHg, peak 31 mmHg), AVA 1.4-1.5 cm2, mild AI, MAC, trivial MR, LA upper limits of normal, RVSP 22 mmHg, mild RAE;   b. Echo 2/16:  EF 55-60%, no RWMA, mild AS (mean 17 mmHg, peak 27 mmHg), AVA 1.5 cm2, mild AI, MAC, mild LAE  . Arthritis   . Bladder neoplasm   . Chronic cough    NO CARDIAC OR PULMONARY RELATED  . Coronary artery disease CARDIOLOGIST-  DR KLEIN/ ALLRED   a. s/p CABG 2004, b. LHC with patent grafts 07/2005, ejection fraction 50%;  c. Myoview 2/15: no ischemia, EF 61%  . Dry eyes   . Dyslipidemia   . GERD (gastroesophageal reflux disease)   . HTN (hypertension)   . Mild aortic stenosis   . PAF (paroxysmal atrial fibrillation) ([place])    CHADS2-VASc:  4  . Paroxysmal atrial fibrillation (HCC)   . 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 Amiodarone Rx  . S/P CABG x 5 2004   Past Surgical History:  Procedure Laterality Date  . CARDIAC CATHETERIZATION  07-26-2005  DR GAMBLE   PRESERVED LVF/  EF 50%/ PATENT  GRAFTS  . CARDIAC CATHETERIZATION  03-04-2003  DR GAMBLE   SEVERE 3 VESSEL DISEASE  . [place]   PAF/ FALSE POSITIVE STRESS TEST  . CATARACT EXTRACTION W/ INTRAOCULAR LENS IMPLANT Right   . CORONARY ARTERY BYPASS GRAFT  03-08-2003  DR [id number]   LIMA TO LAD/ SVG TO OM2/  SVG TO DIAGONAL / SVG TO PDA & PLA  . CYSTOSCOPY WITH BIOPSY N/A 12/25/2012   Procedure: CYSTOSCOPY WITH BLADDER BIOPSY;  Surgeon: Fredricka Bonine, MD;  Location: [facility];  Service: Urology;  Laterality: N/A;  . ELECTROPHYSIOLOGY STUDY  07-27-2005  DR Carleene Overlie TAYLOR   MAPPING --   RESULT NONINDUCIBLE VT OR SVT/  DX RIGHT VENTRICULAR OUTFLOW TRACT VT AND RULES OUT MORE MALIGNANT CAUSES OF VT  . INGUINAL HERNIA REPAIR Bilateral [place]  . KNEE ARTHROSCOPY Right 1994  . LUMBAR DISC SURGERY  1999   L4 -- L5  . MOHS SURGERY     by Dr. Levada Dy 2019  . REMOVAL VOCAL CORD POLYPS  1978  . TONSILLECTOMY  AS CHILD  . TRANSTHORACIC ECHOCARDIOGRAM  08-08-2011   MILD LVH/  EF 97-58%/  GRADE I DIASTOLIC DYSFUNCTION/ MILD AV STENOSIS    Current Outpatient Medications  Medication Sig Dispense Refill  . acetaminophen (TYLENOL) 325 MG tablet Take 650 mg by  mouth once as needed for moderate pain or headache.    Marland Kitchen apixaban (ELIQUIS) 5 MG TABS tablet Take 1 tablet (5 mg total) by mouth 2 (two) times daily. 180 tablet 3  . butalbital-acetaminophen-caffeine (FIORICET, ESGIC) 50-325-40 MG per tablet Take 1 tablet by mouth once as needed for headache.     . cetirizine (ZYRTEC) 10 MG tablet Take 10 mg by mouth daily.    . cholecalciferol (VITAMIN D) 1000 UNITS tablet Take 1,000 Units by mouth daily.    Marland Kitchen diltiazem (CARDIZEM CD) 120 MG 24 hr capsule Take 1 capsule (120 mg) by mouth once daily    . fluticasone (FLONASE) 50 MCG/ACT nasal spray Place 1 spray into both nostrils daily.    . hydrochlorothiazide (HYDRODIURIL) 25 MG tablet TAKE 1 TABLET BY MOUTH DAILY IN THE MORNING 90  tablet 3  . Polyethyl Glycol-Propyl Glycol (SYSTANE ULTRA OP) Apply 1-2 drops to eye once as needed (dry eyes.).     Marland Kitchen potassium chloride (K-DUR) 10 MEQ tablet Take 10 mEq by mouth every morning. Take as directed    . pravastatin (PRAVACHOL) 80 MG tablet Take 80 mg by mouth every evening.      No current facility-administered medications for this visit.     Allergies:   Ace inhibitors; Doxycycline; Hycodan [hydrocodone-homatropine]; Oxycodone; Zocor [simvastatin]; and Cephalexin   Social History: Social History   Socioeconomic History  . Marital status: Married    Spouse name: Not on file  . Number of children: 3  . Years of education: Not on file  . Highest education level: Not on file  Occupational History  . Occupation: Retired  Scientific laboratory technician  . Financial resource strain: Not on file  . Food insecurity:    Worry: Not on file    Inability: Not on file  . Transportation needs:    Medical: Not on file    Non-medical: Not on file  Tobacco Use  . Smoking status: Former Smoker    Packs/day: 1.00    Years: 25.00    Pack years: 25.00    Types: Cigarettes    Last attempt to quit: 04/25/1980    Years since quitting: 38.1  . Smokeless tobacco: Never Used  Substance and Sexual Activity  . Alcohol use: No    Alcohol/week: 0.0 standard drinks  . Drug use: No  . Sexual activity: Not on file  Lifestyle  . Physical activity:    Days per week: Not on file    Minutes per session: Not on file  . Stress: Not on file  Relationships  . Social connections:    Talks on phone: Not on file    Gets together: Not on file    Attends religious service: Not on file    Active member of club or organization: Not on file    Attends meetings of clubs or organizations: Not on file    Relationship status: Not on file  . Intimate partner violence:    Fear of current or ex partner: Not on file    Emotionally abused: Not on file    Physically abused: Not on file    Forced sexual activity: Not on  file  Other Topics Concern  . Not on file  Social History Narrative   [employer]56-'58, overseas to [place]   Some care through [place]   No service disability    Family History: Family History  Problem Relation Age of Onset  . Cancer Mother     Review of Systems:  All other systems reviewed and are otherwise negative except as noted above.   Physical Exam: VS:  BP 128/76   Pulse 90   Ht 5' 11"  (1.803 m)   Wt 202 lb 12.8 oz (92 kg)   SpO2 98%   BMI 28.28 kg/m  , BMI Body mass index is 28.28 kg/m. Wt Readings from Last 3 Encounters:  05/24/18 202 lb 12.8 oz (92 kg)  05/01/18 201 lb 4 oz (91.3 kg)  04/02/18 199 lb (90.3 kg)    GEN- The patient is elderly appearing, alert and oriented x 3 today.   HEENT: normocephalic, atraumatic; sclera clear, conjunctiva pink; hearing intact; oropharynx clear; neck supple  Lungs- Clear to ausculation bilaterally, normal work of breathing.  No wheezes, rales, rhonchi Heart- Irregular rate and rhythm  GI- soft, non-tender, non-distended, bowel sounds present  Extremities- no clubbing, cyanosis, or edema  MS- no significant deformity or atrophy Skin- warm and dry, no rash or lesion  Psych- euthymic mood, full affect Neuro- strength and sensation are intact   EKG:  EKG is ordered today. EKG today shows atrial fibrillation, rate 90  Recent Labs: 08/22/2017: BUN 20; Sodium 143 12/20/2017: ALT 18; Creatinine, Ser 1.010; Hemoglobin 14.4; Platelets 209; Potassium 3.9; TSH 2.02    Other studies Reviewed: Additional studies/ records that were reviewed today include:Dr Klein's office notes  Assessment and Plan:  1.  Permanent atrial fibrillation V rates controlled Continue Eliquis for CHADS2VASC of 4 Per pharmacy protocol, ok to hold for 1 day prior to colonoscopy  2.  CAD s/p CABG No recent ischemic symptoms Continue current therapy  3.  HTN Stable No change required today   4.  Surgical clearance  Patient pending screening  colonoscopy He is able to perform >4 mets without chest pain or shortness of breath  5.  Hematuria Resolved CBC today I have encouraged him to follow up with Dr Junious Silk   Current medicines are reviewed at length with the patient today.   The patient does not have concerns regarding his medicines.  The following changes were made today:  none  Labs/ tests ordered today include: none Orders Placed This Encounter  Procedures  . CBC  . EKG 12-Lead     Disposition:   Follow up with Dr Caryl Comes 1 year   Signed, Chanetta Marshall, NP 05/24/2018 9:21 AM   [place] [address] [place] [address] [phone] (office) [phone] (fax)

## 2018-05-24 ENCOUNTER — Encounter: Payer: Self-pay | Admitting: Nurse Practitioner

## 2018-05-24 ENCOUNTER — Ambulatory Visit (INDEPENDENT_AMBULATORY_CARE_PROVIDER_SITE_OTHER): Payer: Medicare Other | Admitting: Nurse Practitioner

## 2018-05-24 VITALS — BP 128/76 | HR 90 | Ht 71.0 in | Wt 202.8 lb

## 2018-05-24 DIAGNOSIS — I255 Ischemic cardiomyopathy: Secondary | ICD-10-CM

## 2018-05-24 DIAGNOSIS — I4821 Permanent atrial fibrillation: Secondary | ICD-10-CM

## 2018-05-24 DIAGNOSIS — Z01818 Encounter for other preprocedural examination: Secondary | ICD-10-CM | POA: Diagnosis not present

## 2018-05-24 DIAGNOSIS — R319 Hematuria, unspecified: Secondary | ICD-10-CM

## 2018-05-24 DIAGNOSIS — I10 Essential (primary) hypertension: Secondary | ICD-10-CM | POA: Diagnosis not present

## 2018-05-24 NOTE — Patient Instructions (Signed)
Medication Instructions:  none If you need a refill on your cardiac medications before your next appointment, please call your pharmacy.   Lab work: TODAY CBC If you have labs (blood work) drawn today and your tests are completely normal, you will receive your results only by: Marland Kitchen MyChart Message (if you have MyChart) OR . A paper copy in the mail If you have any lab test that is abnormal or we need to change your treatment, we will call you to review the results.  Testing/Procedures: NONE  Follow-Up: At [facility], you and your health needs are our priority.  As part of our continuing mission to provide you with exceptional heart care, we have created designated Provider Care Teams.  These Care Teams include your primary Cardiologist (physician) and Advanced Practice Providers (APPs -  Physician Assistants and Nurse Practitioners) who all work together to provide you with the care you need, when you need it. You will need a follow up appointment in 1 years.  Please call our office 2 months in advance to schedule this appointment.  You may see Dr Caryl Comes or one of the following Advanced Practice Providers on your designated Care Team:   Chanetta Marshall, NP . Tommye Standard, PA-C  Any Other Special Instructions Will Be Listed Below (If Applicable).

## 2018-05-25 LAB — CBC
Hematocrit: 41.4 % (ref 37.5–51.0)
Hemoglobin: 14.3 g/dL (ref 13.0–17.7)
MCH: 31.8 pg (ref 26.6–33.0)
MCHC: 34.5 g/dL (ref 31.5–35.7)
MCV: 92 fL (ref 79–97)
PLATELETS: 213 10*3/uL (ref 150–450)
RBC: 4.49 x10E6/uL (ref 4.14–5.80)
RDW: 13.1 % (ref 11.6–15.4)
WBC: 6 10*3/uL (ref 3.4–10.8)

## 2018-05-29 DIAGNOSIS — Z8371 Family history of colonic polyps: Secondary | ICD-10-CM | POA: Diagnosis not present

## 2018-05-29 DIAGNOSIS — Z86010 Personal history of colonic polyps: Secondary | ICD-10-CM | POA: Diagnosis not present

## 2018-05-29 DIAGNOSIS — Z1211 Encounter for screening for malignant neoplasm of colon: Secondary | ICD-10-CM | POA: Diagnosis not present

## 2018-05-29 DIAGNOSIS — K648 Other hemorrhoids: Secondary | ICD-10-CM | POA: Diagnosis not present

## 2018-05-29 DIAGNOSIS — K5730 Diverticulosis of large intestine without perforation or abscess without bleeding: Secondary | ICD-10-CM | POA: Diagnosis not present

## 2018-05-29 DIAGNOSIS — Z800 Family history of malignant neoplasm of digestive organs: Secondary | ICD-10-CM | POA: Diagnosis not present

## 2018-05-29 LAB — HM COLONOSCOPY

## 2018-06-07 ENCOUNTER — Encounter: Payer: Self-pay | Admitting: Family Medicine

## 2018-06-11 NOTE — Telephone Encounter (Signed)
Melissa can you see if this note can be closed. Looks like an incomplete note you started.

## 2018-07-16 ENCOUNTER — Encounter: Payer: Self-pay | Admitting: Family Medicine

## 2018-07-18 ENCOUNTER — Other Ambulatory Visit: Payer: Self-pay | Admitting: Family Medicine

## 2018-07-18 MED ORDER — OMEPRAZOLE 20 MG PO CPDR: 20.0000 mg | DELAYED_RELEASE_CAPSULE | Freq: Every day | ORAL | Status: DC

## 2018-08-19 ENCOUNTER — Encounter: Payer: Self-pay | Admitting: Family Medicine

## 2018-08-20 ENCOUNTER — Ambulatory Visit (INDEPENDENT_AMBULATORY_CARE_PROVIDER_SITE_OTHER): Payer: Medicare Other | Admitting: Family Medicine

## 2018-08-20 ENCOUNTER — Other Ambulatory Visit: Payer: Self-pay

## 2018-08-20 ENCOUNTER — Encounter: Payer: Self-pay | Admitting: Family Medicine

## 2018-08-20 DIAGNOSIS — I255 Ischemic cardiomyopathy: Secondary | ICD-10-CM

## 2018-08-20 DIAGNOSIS — J069 Acute upper respiratory infection, unspecified: Secondary | ICD-10-CM

## 2018-08-20 NOTE — Progress Notes (Signed)
Virtual visit completed through WebEx or similar program Patient location: home  Provider location: [place] at [facility], office   Limitations and rationale for visit method d/w patient.  Patient agreed to proceed.   CC: URI  HPI: sx started about 2 nights ago.  He felt hot, checked his temp, 99-100.8.  100.8 this AM at 7:30.  HA, he has used fioricet w/o relief.  He has some occ diffuse aches, trunk and extremities.  Some cough but he has h/o cough at baseline.  This isn't worse than normal.  No sputum usually, only with some clear sputum "once in a great while."  Not SOB.  No CP.  ST was mild a notes few nights ago, and improved with salt water gargles.  No ST now.  No ear pain.  No facial pain, but some facial pressure on the max area.  Dec in appetite but taking fluids.  He has normal sense of taste smell.    Minimal exposure to others except for limited shopping with a mask in the last few weeks.    Meds and allergies reviewed.   ROS: Per HPI unless specifically indicated in ROS section   NAD Speech wnl, speaking in complete sentences.   A/P: URI sx.   Supportive tx at this point.   Advised to stay at home for 1 week at this point.  We don't have testing capacity in [place] at this point.  CXR likely not needed now, d/w pt.  Discussed scheduled tylenol.  Can take 2 at a time up to 3 times a day.   Rest and fluids in the meantime.   Update me as needed.   Advised to use warm compresses for facial pressure.  He agrees and understood.

## 2018-08-20 NOTE — Telephone Encounter (Signed)
Pt scheduled for VV at 845 today.

## 2018-08-20 NOTE — Telephone Encounter (Signed)
Pt has VV scheduled for 845 today.

## 2018-08-22 NOTE — Assessment & Plan Note (Addendum)
URI sx.   Supportive tx at this point.   Advised to stay at home for 1 week at this point.  We don't have testing capacity in [place] at this point.  CXR likely not needed now, d/w pt.  Discussed scheduled tylenol.  Can take 2 at a time up to 3 times a day.   Rest and fluids in the meantime.   Update me as needed.   Advised to use warm compresses for facial pressure.  He agrees and understood.   No sign of ominous diagnosis at this point.  He agrees.

## 2018-08-23 ENCOUNTER — Encounter: Payer: Self-pay | Admitting: Family Medicine

## 2018-08-23 ENCOUNTER — Ambulatory Visit (INDEPENDENT_AMBULATORY_CARE_PROVIDER_SITE_OTHER): Payer: Medicare Other | Admitting: Family Medicine

## 2018-08-23 DIAGNOSIS — I255 Ischemic cardiomyopathy: Secondary | ICD-10-CM | POA: Diagnosis not present

## 2018-08-23 DIAGNOSIS — J069 Acute upper respiratory infection, unspecified: Secondary | ICD-10-CM

## 2018-08-23 NOTE — Progress Notes (Signed)
Virtual visit completed through WebEx or similar program Patient location: home  Provider location: [place] at [facility], office   Limitations and rationale for visit method d/w patient.  Patient agreed to proceed.   CC: follow up.   HPI:   Recent fever and presumed viral illness.  He has GERD at baseline.  He has some cough from that.  He used vick's on his chest then went to bed last night.  He had an episode of worse sweating last night, he attributed that to the vicks on his chest.  He has milder sweats a few nights prior to vicks use.    Temp 98.7 this AM.  He feels better after the sweat.  Still with some fatigue.  Minimal cough.  No SOB. No ST.  Taste and smell are good.  No chills in the last few days.  Some occ nausea. Some occ HA, but that isn't atypical for the patient.    He feels a little lightheaded, can happen on standing or shortly thereafter.  It doesn't happen every time with standing.    His last fever was 2 days ago, 100.9.  Since then, no fevers >100.  Separate issue- his wife went to the dentist to get her concerns addressed.   Meds and allergies reviewed.   ROS: Per HPI unless specifically indicated in ROS section   NAD Speech wnl  A/P: Fever, possibly/likely viral syndrome.   NAD, okay for outpatient f/u.   Wouldn't need ER eval.   I want him to update me tomorrow, if he has a sweat tonight if he doesn't use vicks.  Inc fluids in the meantime.   Check BP at home and hold HCTZ prn if lightheaded.   He agrees with plan.

## 2018-08-24 ENCOUNTER — Encounter: Payer: Self-pay | Admitting: Family Medicine

## 2018-08-24 NOTE — Assessment & Plan Note (Signed)
Fever, possibly/likely viral syndrome.   NAD, okay for outpatient f/u.   Wouldn't need ER eval.   I want him to update me tomorrow, if he has a sweat tonight if he doesn't use vicks.  Inc fluids in the meantime.   Check BP at home and hold HCTZ prn if lightheaded.   He agrees with plan.

## 2018-09-07 ENCOUNTER — Encounter: Payer: Self-pay | Admitting: Family Medicine

## 2018-09-07 LAB — NOVEL CORONAVIRUS, NAA: SARS-CoV-2, NAA: NEGATIVE

## 2018-09-07 NOTE — Telephone Encounter (Signed)
Information updated in the chart. Thank you

## 2018-09-11 ENCOUNTER — Telehealth: Payer: Self-pay | Admitting: Family Medicine

## 2018-09-11 ENCOUNTER — Ambulatory Visit (INDEPENDENT_AMBULATORY_CARE_PROVIDER_SITE_OTHER): Payer: Medicare Other | Admitting: Family Medicine

## 2018-09-11 ENCOUNTER — Encounter: Payer: Self-pay | Admitting: Family Medicine

## 2018-09-11 DIAGNOSIS — R05 Cough: Secondary | ICD-10-CM | POA: Diagnosis not present

## 2018-09-11 DIAGNOSIS — R059 Cough, unspecified: Secondary | ICD-10-CM

## 2018-09-11 MED ORDER — BENZONATATE 200 MG PO CAPS: 200.0000 mg | ORAL_CAPSULE | Freq: Three times a day (TID) | ORAL | 1 refills | Status: DC | PRN

## 2018-09-11 MED ORDER — ESOMEPRAZOLE MAGNESIUM 20 MG PO CPDR: 20.0000 mg | DELAYED_RELEASE_CAPSULE | Freq: Every day | ORAL | Status: DC

## 2018-09-11 MED ORDER — FLUTICASONE PROPIONATE 50 MCG/ACT NA SUSP: 1.0000 | Freq: Every day | NASAL | 1 refills | Status: DC

## 2018-09-11 NOTE — Progress Notes (Signed)
Interactive audio and video telecommunications were attempted between this provider and patient, however failed, due to patient having technical difficulties OR patient did not have access to video capability.  We continued and completed visit with audio only.   Virtual Visit via Telephone Note  I connected with patient on 09/11/18 at 410 by telephone and verified that I am speaking with the correct person using two identifiers.  Location of patient: home  Location of MD: [place] Name of referring provider (if blank then none associated): Names per persons and role in encounter:  MD: Earlyne Iba, Patient: name listed above.    I discussed the limitations, risks, security and privacy concerns of performing an evaluation and management service by telephone and the availability of in person appointments. I also discussed with the patient that there may be a patient responsible charge related to this service. The patient expressed understanding and agreed to proceed.  CC: cough.   HPI: recent covid testing negative.   Sx noted in the last week.  Seemed different from prev cough.  Dry cough at the "base of the throat" with a tickling sensation that triggers the cough.  Worse at night.  He tried tums, pepto, hot tea, w/o relief.  Drinking plenty of water.    He had clearly improved from the end of April.  No recent fevers.  He was feeling better in general compared to prev.  He has been able to work in yard for up to 4 hours at a time yesterday. His sweats are better, none in the last few nights.   He is on zytrec, but hasn't used flonase recently.  He has used cough drops frequently.    Observations/Objective:nad Speech wnl  Assessment and Plan:  Sx noted in the last week.  Discussed with patient about options.  Restart flonase and start tessalon and f/u prn.  He agreed plan.  He had clearly improved from the end of April.  No recent fevers.  He was feeling better in general  compared to prev.  He has been able to work in yard for up to 4 hours at a time yesterday. His sweats are better, none in the last few nights.   Follow Up Instructions:see above.    I discussed the assessment and treatment plan with the patient. The patient was provided an opportunity to ask questions and all were answered. The patient agreed with the plan and demonstrated an understanding of the instructions.   The patient was advised to call back or seek an in-person evaluation if the symptoms worsen or if the condition fails to improve as anticipated.  I provided 15 minutes of non-face-to-face time during this encounter.  Elsie Stain, MD

## 2018-09-11 NOTE — Telephone Encounter (Signed)
Best number [phone] Pt called wanting to be seen in office for dry cough @ base of throat he stated he was tested 5/15 @ [facility] he received his results back they were neg.  Pt scheduled virtual appointment today @3 :45

## 2018-09-12 NOTE — Assessment & Plan Note (Signed)
Sx noted in the last week.  Discussed with patient about options.  Restart flonase and start tessalon and f/u prn.  He agreed plan.  No sign of ominous diagnosis.  Okay for outpatient follow-up.  He could be having symptoms from postnasal drip.  He had clearly improved from the end of April.  No recent fevers.  He was feeling better in general compared to prev.  He has been able to work in yard for up to 4 hours at a time yesterday. His sweats are better, none in the last few nights.

## 2018-09-26 ENCOUNTER — Other Ambulatory Visit: Payer: Self-pay | Admitting: Internal Medicine

## 2018-10-17 DIAGNOSIS — K5900 Constipation, unspecified: Secondary | ICD-10-CM | POA: Diagnosis not present

## 2018-12-27 ENCOUNTER — Encounter: Payer: Self-pay | Admitting: Family Medicine

## 2018-12-28 ENCOUNTER — Ambulatory Visit (INDEPENDENT_AMBULATORY_CARE_PROVIDER_SITE_OTHER): Payer: Medicare Other

## 2018-12-28 ENCOUNTER — Other Ambulatory Visit: Payer: Self-pay

## 2018-12-28 ENCOUNTER — Ambulatory Visit: Payer: Medicare Other

## 2018-12-28 DIAGNOSIS — Z23 Encounter for immunization: Secondary | ICD-10-CM

## 2019-01-27 ENCOUNTER — Encounter: Payer: Self-pay | Admitting: Family Medicine

## 2019-03-19 ENCOUNTER — Other Ambulatory Visit: Payer: Self-pay

## 2019-03-25 ENCOUNTER — Other Ambulatory Visit: Payer: Self-pay | Admitting: Internal Medicine

## 2019-04-02 ENCOUNTER — Ambulatory Visit (INDEPENDENT_AMBULATORY_CARE_PROVIDER_SITE_OTHER): Payer: Medicare Other | Admitting: Family Medicine

## 2019-04-02 ENCOUNTER — Encounter: Payer: Self-pay | Admitting: Family Medicine

## 2019-04-02 ENCOUNTER — Other Ambulatory Visit: Payer: Self-pay

## 2019-04-02 DIAGNOSIS — M791 Myalgia, unspecified site: Secondary | ICD-10-CM | POA: Diagnosis not present

## 2019-04-02 DIAGNOSIS — I255 Ischemic cardiomyopathy: Secondary | ICD-10-CM

## 2019-04-02 MED ORDER — PRAVASTATIN SODIUM 80 MG PO TABS: ORAL_TABLET | ORAL | Status: DC

## 2019-04-02 NOTE — Patient Instructions (Addendum)
Try stopping the pravastatin and see if the aches get better.   Either way, let me know in about 5 days.   Take care.  Glad to see you.

## 2019-04-02 NOTE — Progress Notes (Signed)
This visit occurred during the SARS-CoV-2 public health emergency.  Safety protocols were in place, including screening questions prior to the visit, additional usage of staff PPE, and extensive cleaning of exam room while observing appropriate contact time as indicated for disinfecting solutions.   Back pain.  Episodically worse, in the last 3-4 weeks.  Episodic neck pain.  Pain looking to the side, ie merging in traffic, L>R trap area pain.  Diffuse thorax pain.  Pain rolling over at night.  Then yesterday "I was 90% better."  Then worse again today.  Can have pain the upper chest, near the clavicles.  But then noted in the lower abd pain.  He has positional occiput pain sitting in a recliner.  He doesn't have similar arm or leg aches.   He hasn't been sleeping well, not just from aches.  He has insomnia at baseline.  He has been napping some in the day to compensate.    He has been having some cough, attributed to GERD.    PMH and SH reviewed  ROS: Per HPI unless specifically indicated in ROS section   Meds, vitals, and allergies reviewed.   nad ncat Neck supple, no LA, not a stiff neck. He sounds RRR. Ctab Abdomen soft.  Not tender. Chest and thorax nontender. No rash No edema.

## 2019-04-04 DIAGNOSIS — M791 Myalgia, unspecified site: Secondary | ICD-10-CM | POA: Insufficient documentation

## 2019-04-04 NOTE — Assessment & Plan Note (Signed)
Not tender at time of exam.  I wonder if this is related to his statin.  Discussed options.  Would try 1 thing at a time. He will try stopping the pravastatin and see if the aches get better.   Either way, he will let me know in about 5 days.   Still okay for outpatient follow-up.  No ominous findings on exam.

## 2019-04-11 ENCOUNTER — Encounter: Payer: Self-pay | Admitting: Family Medicine

## 2019-04-12 ENCOUNTER — Other Ambulatory Visit: Payer: Self-pay | Admitting: Family Medicine

## 2019-04-12 MED ORDER — PRAVASTATIN SODIUM 80 MG PO TABS: 40.0000 mg | ORAL_TABLET | Freq: Every day | ORAL | Status: DC

## 2019-05-03 ENCOUNTER — Encounter: Payer: Self-pay | Admitting: Family Medicine

## 2019-06-04 ENCOUNTER — Telehealth: Payer: Self-pay | Admitting: Family Medicine

## 2019-06-04 NOTE — Telephone Encounter (Signed)
Pt called stating he spoke to [employer] and he needs a consent in writing from PCP stating it is ok for pt to have covid vaccine.  He has appointment on Saturday 2/13

## 2019-06-04 NOTE — Telephone Encounter (Signed)
Pt aware letter is ready

## 2019-06-04 NOTE — Telephone Encounter (Signed)
Letter done. Thanks.

## 2019-06-20 ENCOUNTER — Ambulatory Visit (INDEPENDENT_AMBULATORY_CARE_PROVIDER_SITE_OTHER): Payer: Medicare Other | Admitting: Family Medicine

## 2019-06-20 ENCOUNTER — Other Ambulatory Visit: Payer: Self-pay

## 2019-06-20 ENCOUNTER — Encounter: Payer: Self-pay | Admitting: Family Medicine

## 2019-06-20 DIAGNOSIS — M79606 Pain in leg, unspecified: Secondary | ICD-10-CM

## 2019-06-20 NOTE — Progress Notes (Signed)
This visit occurred during the SARS-CoV-2 public health emergency.  Safety protocols were in place, including screening questions prior to the visit, additional usage of staff PPE, and extensive cleaning of exam room while observing appropriate contact time as indicated for disinfecting solutions.  Leg pain, left.  Started on 06/10/19 in the afternoon. He had medial L knee pain.  ttp locally.  Pain walking.  It got some better over the next few days with icing.  Got back to normal. Then two nights ago he had posterior knee pain "like a bad cramp."  That is slowly getting better, tried using heat.  No bruising.  Still anticoagulated. No ADE on med.    No ADE on pravastatin.    Meds, vitals, and allergies reviewed.   ROS: Per HPI unless specifically indicated in ROS section   nad ncat Left knee with normal inspection and normal range of motion.  Patella not tender.  Quad and hamstring not tender.  Able to bear weight.  No erythema.  No bruising.

## 2019-06-20 NOTE — Patient Instructions (Signed)
I think this was just a cramp but it can be really annoying.   Gently stretch and drink enough water to keep your urine clear.  Take care.  Glad to see you.

## 2019-06-23 DIAGNOSIS — M79606 Pain in leg, unspecified: Secondary | ICD-10-CM | POA: Insufficient documentation

## 2019-06-23 NOTE — Assessment & Plan Note (Signed)
No sign of ominous process at this point.  He is improving some in the meantime.  His initial knee pain could have been bursitis that improved in the meantime but he could have had some subsequent gait changes that caused muscle strain/cramping.  He is improved in the meantime.  I would stay well-hydrated and stretch gently as needed and update me as needed.  He agrees.

## 2019-08-14 ENCOUNTER — Telehealth: Payer: Self-pay | Admitting: Family Medicine

## 2019-08-14 NOTE — Telephone Encounter (Signed)
Called patient and left message for him to call office and get scheduled for cpe and labs.

## 2019-08-20 ENCOUNTER — Ambulatory Visit: Payer: Medicare Other

## 2019-08-22 ENCOUNTER — Ambulatory Visit (INDEPENDENT_AMBULATORY_CARE_PROVIDER_SITE_OTHER): Payer: Medicare Other

## 2019-08-22 VITALS — BP 116/75 | HR 93 | Wt 200.0 lb

## 2019-08-22 DIAGNOSIS — Z0000 Encounter for general adult medical examination without abnormal findings: Secondary | ICD-10-CM

## 2019-08-22 NOTE — Progress Notes (Signed)
Subjective:   [name] [name] is a 85 y.o. male who presents for an Initial Medicare Annual Wellness Visit.  Review of Systems: N/A    This visit is being conducted through telemedicine via telephone at the nurse health advisor's home address due to the COVID-19 pandemic. This patient has given me verbal consent via doximity to conduct this visit, patient states they are participating from their home address. Patient and myself are on the telephone call. There is no referral for this visit. Some vital signs may be absent or patient reported.    Patient identification: identified by name, DOB, and current address   Cardiac Risk Factors include: advanced age (>70mn, >>60women);male gender;dyslipidemia    Objective:    Today's Vitals   08/22/19 1023  BP: 116/75  Pulse: 93  Weight: 200 lb (90.7 kg)   Body mass index is 27.89 kg/m.  Advanced Directives 08/22/2019 02/11/2016 02/11/2016 01/20/2014 12/25/2012  Does Patient Have a Medical Advance Directive? Yes Yes Yes Yes Patient has advance directive, copy not in chart  Type of Advance Directive [place]oryLiving will Living will Living will [place] will [place] will  Does patient want to make changes to medical advance directive? - No - Patient declined - - -  Copy of H[place]winin Chart? No - copy requested No - copy requested - No - copy requested Copy requested from family  Pre-existing out of facility DNR order (yellow form or pink MOST form) - - - - No    Current Medications (verified) Outpatient Encounter Medications as of 08/22/2019  Medication Sig  . acetaminophen (TYLENOL) 325 MG tablet Take 650 mg by mouth once as needed for moderate pain or headache.  .Marland Kitchenapixaban (ELIQUIS) 5 MG TABS tablet Take 1 tablet (5 mg total) by mouth 2 (two) times daily.  . butalbital-acetaminophen-caffeine (FIORICET, ESGIC) 50-325-40 MG per tablet Take 1  tablet by mouth once as needed for headache.   . cetirizine (ZYRTEC) 10 MG tablet Take 10 mg by mouth daily.  . cholecalciferol (VITAMIN D) 1000 UNITS tablet Take 1,000 Units by mouth daily.  .Marland Kitchendiltiazem (CARDIZEM CD) 120 MG 24 hr capsule Take 1 capsule (120 mg) by mouth once daily  . fluticasone (FLONASE) 50 MCG/ACT nasal spray Place 1 spray into both nostrils daily.  . hydrochlorothiazide (HYDRODIURIL) 25 MG tablet TAKE 1 TABLET BY MOUTH EACH MORNING  . Melatonin 3 MG TABS Take by mouth at bedtime.  .Vladimir FasterGlycol-Propyl Glycol (SYSTANE ULTRA OP) Apply 1-2 drops to eye once as needed (dry eyes.).   .Marland Kitchenpotassium chloride (K-DUR) 10 MEQ tablet Take 10 mEq by mouth every morning. Take as directed  . pravastatin (PRAVACHOL) 80 MG tablet Take 0.5 tablets (40 mg total) by mouth daily.   No facility-administered encounter medications on file as of 08/22/2019.    Allergies (verified) Ace inhibitors, Doxycycline, Hycodan [hydrocodone-homatropine], Oxycodone, Zocor [simvastatin], and Cephalexin   History: Past Medical History:  Diagnosis Date  . Anticoagulated on Coumadin    [facility] A[facility] stenosis    a. Echo 2/15: Severe LVH, EF 55-60%, Gr 1 DD, mild to mod AS (mean 20 mmHg, peak 31 mmHg), AVA 1.4-1.5 cm2, mild AI, MAC, trivial MR, LA upper limits of normal, RVSP 22 mmHg, mild RAE;   b. Echo 2/16:  EF 55-60%, no RWMA, mild AS (mean 17 mmHg, peak 27 mmHg), AVA 1.5 cm2, mild AI,  MAC, mild LAE  . Arthritis   . Bladder neoplasm   . Chronic cough    NO CARDIAC OR PULMONARY RELATED  . Coronary artery disease CARDIOLOGIST-  DR KLEIN/ [facility]   a. s/p CABG 2004, b. LHC with patent grafts 07/2005, ejection fraction 50%;  c. Myoview 2/15: no ischemia, EF 61%  . Dry eyes   . Dyslipidemia   . GERD (gastroesophageal reflux disease)   . HTN (hypertension)   . Mild aortic stenosis   . PAF (paroxysmal atrial fibrillation) ([place])    CHADS2-VASc:  4  . Paroxysmal atrial  fibrillation (HCC)   . 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 Amiodarone Rx  . S/P CABG x 5 2004   Past Surgical History:  Procedure Laterality Date  . CARDIAC CATHETERIZATION  07-26-2005  DR GAMBLE   PRESERVED LVF/  EF 50%/ PATENT GRAFTS  . CARDIAC CATHETERIZATION  03-04-2003  DR GAMBLE   SEVERE 3 VESSEL DISEASE  . [place]   PAF/ FALSE POSITIVE STRESS TEST  . CATARACT EXTRACTION W/ INTRAOCULAR LENS IMPLANT Right   . CORONARY ARTERY BYPASS GRAFT  03-08-2003  DR [email or web address]   LIMA TO LAD/ SVG TO OM2/  SVG TO DIAGONAL / SVG TO PDA & PLA  . CYSTOSCOPY WITH BIOPSY N/A 12/25/2012   Procedure: CYSTOSCOPY WITH BLADDER BIOPSY;  Surgeon: Fredricka Bonine, MD;  Location: [facility];  Service: Urology;  Laterality: N/A;  . ELECTROPHYSIOLOGY STUDY  07-27-2005  DR Carleene Overlie TAYLOR   MAPPING --   RESULT NONINDUCIBLE VT OR SVT/  DX RIGHT VENTRICULAR OUTFLOW TRACT VT AND RULES OUT MORE MALIGNANT CAUSES OF VT  . INGUINAL HERNIA REPAIR Bilateral [place]  . KNEE ARTHROSCOPY Right 1994  . LUMBAR DISC SURGERY  1999   L4 -- L5  . MOHS SURGERY     by Dr. Levada Dy 2019  . REMOVAL VOCAL CORD POLYPS  1978  . TONSILLECTOMY  AS CHILD  . TRANSTHORACIC ECHOCARDIOGRAM  08-08-2011   MILD LVH/  EF 49-17%/  GRADE I DIASTOLIC DYSFUNCTION/ MILD AV STENOSIS   Family History  Problem Relation Age of Onset  . Cancer Mother    Social History   Socioeconomic History  . Marital status: Married    Spouse name: Not on file  . Number of children: 3  . Years of education: Not on file  . Highest education level: Not on file  Occupational History  . Occupation: Retired  Tobacco Use  . Smoking status: Former Smoker    Packs/day: 1.00    Years: 25.00    Pack years: 25.00    Types: Cigarettes    Quit date: 04/25/1980    Years since quitting: 39.3  . Smokeless tobacco: Never Used  Substance and Sexual Activity  . Alcohol  use: No    Alcohol/week: 0.0 standard drinks  . Drug use: No  . Sexual activity: Not on file  Other Topics Concern  . 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 Difficulty of Paying Living Expenses: Not hard at all  Food Insecurity: No Food Insecurity  . Worried About Charity fundraiser in the Last Year: Never true  . Ran Out of Food in the Last Year: Never  true  Transportation Needs: No Transportation Needs  . Lack of Transportation (Medical): No  . Lack of Transportation (Non-Medical): No  Physical Activity: Inactive  . Days of Exercise per Week: 0 days  . Minutes of Exercise per Session: 0 min  Stress: No Stress Concern Present  . Feeling of Stress : Not at all  Social Connections:   . Frequency of Communication with Friends and Family:   . Frequency of Social Gatherings with Friends and Family:   . Attends Religious Services:   . Active Member of Clubs or Organizations:   . Attends Archivist Meetings:   Marland Kitchen Marital Status:    Tobacco Counseling Counseling given: Not Answered   Clinical Intake:  Pre-visit preparation completed: Yes  Pain : No/denies pain     Nutritional Risks: None Diabetes: No  How often do you need to have someone help you when you read instructions, pamphlets, or other written materials from your doctor or pharmacy?: 1 - Never What is the last grade level you completed in school?: 12th  Interpreter Needed?: No  Information entered by :: CJohnson, LPN  Activities of Daily Living In your present state of health, do you have any difficulty performing the following activities: 08/22/2019  Hearing? Y  Comment wears hearing aids  Vision? N  Difficulty concentrating or making decisions? N  Walking or climbing stairs? N  Dressing or bathing? N  Doing errands, shopping? N   Preparing Food and eating ? N  Using the Toilet? N  In the past six months, have you accidently leaked urine? N  Do you have problems with loss of bowel control? N  Managing your Medications? N  Managing your Finances? N  Housekeeping or managing your Housekeeping? N  Some recent data might be hidden     Immunizations and Health Maintenance Immunization History  Administered Date(s) Administered  . Fluad Quad(high Dose 65+) 12/28/2018  . Influenza Split 03/15/2011, 01/19/2012  . Influenza Whole 02/24/2009, 01/14/2010  . Influenza,inj,Quad PF,6+ Mos 01/16/2013, 01/22/2014, 12/25/2014, 01/19/2016, 01/23/2017, 01/04/2018  . PFIZER SARS-COV-2 Vaccination 06/28/2019, 07/19/2019  . Pneumococcal Conjugate-13 12/25/2014  . Pneumococcal Polysaccharide-23 04/17/2002  . Td 05/11/2005  . Zoster 05/17/2007   There are no preventive care reminders to display for this patient.  Patient Care Team: Tonia Ghent, MD as PCP - General (Family Medicine) Deboraha Sprang, MD as PCP - Cardiology (Cardiology)  Indicate any recent Medical Services you may have received from other than [facility] providers in the past year (date may be approximate).    Assessment:   This is a routine wellness examination for [name] [name].  Hearing/Vision screen  Hearing Screening   _0  _1  _2  _3  _4  _5  _6  _7  _8   Right ear:           Left ear:           Vision Screening Comments: Patient gets annual eye exams   Dietary issues and exercise activities discussed: Current Exercise Habits: The patient does not participate in regular exercise at present, Exercise limited by: None identified  Goals    . Patient Stated     08/22/2019, I will maintain and continue medications as prescribed.       Depression Screen PHQ 2/9 Scores 08/22/2019  PHQ - 2 Score 0  PHQ- 9 Score 0    Fall Risk Fall Risk  08/22/2019 03/19/2019 03/15/2018 11/14/2016  Falls in the past year? 0 0 0 No  Comment - Emmi  Telephone Survey: data to  providers prior to load Emmi Telephone Survey: data to providers prior to load Emmi Telephone Survey: data to providers prior to load  Number falls in past yr: 0 - - -  Injury with Fall? 0 - - -  Risk for fall due to : Medication side effect - - -  Follow up Falls evaluation completed;Falls prevention discussed - - -    Is the patient's home free of loose throw rugs in walkways, pet beds, electrical cords, etc?   yes      Grab bars in the bathroom? yes      Handrails on the stairs?   yes      Adequate lighting?   yes  Timed Get Up and Go performed: N/A  Cognitive Function: MMSE - Mini Mental State Exam 08/22/2019  Orientation to time 5  Orientation to Place 5  Registration 3  Attention/ Calculation 5  Recall 3  Language- repeat 1       Mini Cog  Mini-Cog screen was completed. Maximum score is 22. A value of 0 denotes this part of the MMSE was not completed or the patient failed this part of the Mini-Cog screening.  Screening Tests Health Maintenance  Topic Date Due  . INFLUENZA VACCINE  11/24/2019  . TETANUS/TDAP  11/29/2021  . COLONOSCOPY  05/30/2023  . COVID-19 Vaccine  Completed  . PNA vac Low Risk Adult  Completed    Qualifies for Shingles Vaccine: Yes  Cancer Screenings: Lung: Low Dose CT Chest recommended if Age 7-80 years, 30 pack-year currently smoking OR have quit w/in 15 years. Patient does not qualify. Colorectal: completed 05/29/2018  Additional Screenings:  Hepatitis C Screening: N/A      Plan:    Patient will maintain and continue medications as prescribed.   I have personally reviewed and noted the following in the patient's chart:   . Medical and social history . Use of alcohol, tobacco or illicit drugs  . Current medications and supplements . Functional ability and status . Nutritional status . Physical activity . Advanced directives . List of other physicians . Hospitalizations, surgeries, and ER visits in  previous 12 months . Vitals . Screenings to include cognitive, depression, and falls . Referrals and appointments  In addition, I have reviewed and discussed with patient certain preventive protocols, quality metrics, and best practice recommendations. A written personalized care plan for preventive services as well as general preventive health recommendations were provided to patient.     Andrez Grime, LPN   01/12/1006

## 2019-08-22 NOTE — Progress Notes (Signed)
PCP notes:  Health Maintenance: No gaps noted   Abnormal Screenings: none   Patient concerns: Chronic cough and sneezing at nighttime. Onset several years ago.    Nurse concerns: none   Next PCP appt.: none

## 2019-08-22 NOTE — Patient Instructions (Signed)
[name] [name] , Thank you for taking time to come for your Medicare Wellness Visit. I appreciate your ongoing commitment to your health goals. Please review the following plan we discussed and let me know if I can assist you in the future.   Screening recommendations/referrals: Colonoscopy: Up to date, completed 05/29/2018 Recommended yearly ophthalmology/optometry visit for glaucoma screening and checkup Recommended yearly dental visit for hygiene and checkup  Vaccinations: Influenza vaccine: Up to date, completed 12/28/2018 Pneumococcal vaccine: Completed series Tdap vaccine: Up to date, completed 11/30/2011 Shingles vaccine: discussed    Advanced directives: Please bring a copy of your POA (Power of [place]) and/or Living Will to your next appointment.   Conditions/risks identified: hyperlipidemia  Next appointment: none  Preventive Care 28 Years and Older, Male Preventive care refers to lifestyle choices and visits with your health care provider that can promote health and wellness. What does preventive care include?  A yearly physical exam. This is also called an annual well check.  Dental exams once or twice a year.  Routine eye exams. Ask your health care provider how often you should have your eyes checked.  Personal lifestyle choices, including:  Daily care of your teeth and gums.  Regular physical activity.  Eating a healthy diet.  Avoiding tobacco and drug use.  Limiting alcohol use.  Practicing safe sex.  Taking low doses of aspirin every day.  Taking vitamin and mineral supplements as recommended by your health care provider. What happens during an annual well check? The services and screenings done by your health care provider during your annual well check will depend on your age, overall health, lifestyle risk factors, and family history of disease. Counseling  Your health care provider may ask you questions about your:  Alcohol use.  Tobacco  use.  Drug use.  Emotional well-being.  Home and relationship well-being.  Sexual activity.  Eating habits.  History of falls.  Memory and ability to understand (cognition).  Work and work Statistician. Screening  You may have the following tests or measurements:  Height, weight, and BMI.  Blood pressure.  Lipid and cholesterol levels. These may be checked every 5 years, or more frequently if you are over 36 years old.  Skin check.  Lung cancer screening. You may have this screening every year starting at age 9 if you have a 30-pack-year history of smoking and currently smoke or have quit within the past 15 years.  Fecal occult blood test (FOBT) of the stool. You may have this test every year starting at age 89.  Flexible sigmoidoscopy or colonoscopy. You may have a sigmoidoscopy every 5 years or a colonoscopy every 10 years starting at age 79.  Prostate cancer screening. Recommendations will vary depending on your family history and other risks.  Hepatitis C blood test.  Hepatitis B blood test.  Sexually transmitted disease (STD) testing.  Diabetes screening. This is done by checking your blood sugar (glucose) after you have not eaten for a while (fasting). You may have this done every 1-3 years.  Abdominal aortic aneurysm (AAA) screening. You may need this if you are a current or former smoker.  Osteoporosis. You may be screened starting at age 53 if you are at high risk. Talk with your health care provider about your test results, treatment options, and if necessary, the need for more tests. Vaccines  Your health care provider may recommend certain vaccines, such as:  Influenza vaccine. This is recommended every year.  Tetanus, diphtheria, and acellular pertussis (  Tdap, Td) vaccine. You may need a Td booster every 10 years.  Zoster vaccine. You may need this after age 42.  Pneumococcal 13-valent conjugate (PCV13) vaccine. One dose is recommended after age  57.  Pneumococcal polysaccharide (PPSV23) vaccine. One dose is recommended after age 2. Talk to your health care provider about which screenings and vaccines you need and how often you need them. This information is not intended to replace advice given to you by your health care provider. Make sure you discuss any questions you have with your health care provider. Document Released: 05/08/2015 Document Revised: 12/30/2015 Document Reviewed: 02/10/2015 Elsevier Interactive Patient Education  2017 [place] Prevention in the Home Falls can cause injuries. They can happen to people of all ages. There are many things you can do to make your home safe and to help prevent falls. What can I do on the outside of my home?  Regularly fix the edges of walkways and driveways and fix any cracks.  Remove anything that might make you trip as you walk through a door, such as a raised step or threshold.  Trim any bushes or trees on the path to your home.  Use bright outdoor lighting.  Clear any walking paths of anything that might make someone trip, such as rocks or tools.  Regularly check to see if handrails are loose or broken. Make sure that both sides of any steps have handrails.  Any raised decks and porches should have guardrails on the edges.  Have any leaves, snow, or ice cleared regularly.  Use sand or salt on walking paths during winter.  Clean up any spills in your garage right away. This includes oil or grease spills. What can I do in the bathroom?  Use night lights.  Install grab bars by the toilet and in the tub and shower. Do not use towel bars as grab bars.  Use non-skid mats or decals in the tub or shower.  If you need to sit down in the shower, use a plastic, non-slip stool.  Keep the floor dry. Clean up any water that spills on the floor as soon as it happens.  Remove soap buildup in the tub or shower regularly.  Attach bath mats securely with double-sided  non-slip rug tape.  Do not have throw rugs and other things on the floor that can make you trip. What can I do in the bedroom?  Use night lights.  Make sure that you have a light by your bed that is easy to reach.  Do not use any sheets or blankets that are too big for your bed. They should not hang down onto the floor.  Have a firm chair that has side arms. You can use this for support while you get dressed.  Do not have throw rugs and other things on the floor that can make you trip. What can I do in the kitchen?  Clean up any spills right away.  Avoid walking on wet floors.  Keep items that you use a lot in easy-to-reach places.  If you need to reach something above you, use a strong step stool that has a grab bar.  Keep electrical cords out of the way.  Do not use floor polish or wax that makes floors slippery. If you must use wax, use non-skid floor wax.  Do not have throw rugs and other things on the floor that can make you trip. What can I do with my stairs?  Do  not leave any items on the stairs.  Make sure that there are handrails on both sides of the stairs and use them. Fix handrails that are broken or loose. Make sure that handrails are as long as the stairways.  Check any carpeting to make sure that it is firmly attached to the stairs. Fix any carpet that is loose or worn.  Avoid having throw rugs at the top or bottom of the stairs. If you do have throw rugs, attach them to the floor with carpet tape.  Make sure that you have a light switch at the top of the stairs and the bottom of the stairs. If you do not have them, ask someone to add them for you. What else can I do to help prevent falls?  Wear shoes that:  Do not have high heels.  Have rubber bottoms.  Are comfortable and fit you well.  Are closed at the toe. Do not wear sandals.  If you use a stepladder:  Make sure that it is fully opened. Do not climb a closed stepladder.  Make sure that both  sides of the stepladder are locked into place.  Ask someone to hold it for you, if possible.  Clearly mark and make sure that you can see:  Any grab bars or handrails.  First and last steps.  Where the edge of each step is.  Use tools that help you move around (mobility aids) if they are needed. These include:  Canes.  Walkers.  Scooters.  Crutches.  Turn on the lights when you go into a dark area. Replace any light bulbs as soon as they burn out.  Set up your furniture so you have a clear path. Avoid moving your furniture around.  If any of your floors are uneven, fix them.  If there are any pets around you, be aware of where they are.  Review your medicines with your doctor. Some medicines can make you feel dizzy. This can increase your chance of falling. Ask your doctor what other things that you can do to help prevent falls. This information is not intended to replace advice given to you by your health care provider. Make sure you discuss any questions you have with your health care provider. Document Released: 02/05/2009 Document Revised: 09/17/2015 Document Reviewed: 05/16/2014 Elsevier Interactive Patient Education  2017 [employer].

## 2019-08-26 DIAGNOSIS — H0102A Squamous blepharitis right eye, upper and lower eyelids: Secondary | ICD-10-CM | POA: Diagnosis not present

## 2019-08-26 DIAGNOSIS — H0102B Squamous blepharitis left eye, upper and lower eyelids: Secondary | ICD-10-CM | POA: Diagnosis not present

## 2019-08-26 DIAGNOSIS — H35373 Puckering of macula, bilateral: Secondary | ICD-10-CM | POA: Diagnosis not present

## 2019-08-26 DIAGNOSIS — H43813 Vitreous degeneration, bilateral: Secondary | ICD-10-CM | POA: Diagnosis not present

## 2019-08-26 DIAGNOSIS — Z961 Presence of intraocular lens: Secondary | ICD-10-CM | POA: Diagnosis not present

## 2019-08-26 DIAGNOSIS — H16223 Keratoconjunctivitis sicca, not specified as Sjogren's, bilateral: Secondary | ICD-10-CM | POA: Diagnosis not present

## 2019-09-01 NOTE — Progress Notes (Signed)
Cardiology Office Note Date:  09/01/2019  Patient ID:  [name], [name] October 28, 1934, MRN [id number] PCP:  Tonia Ghent, MD  Cardiologist:  Dr. Caryl Comes   Chief Complaint: annual visit  History of Present Illness: [name] [name] is a 85 y.o. male with history of CAD (remote CABG), mild AS, VT (RVOT tx with amiodarone), PAFib, syncope (pust micturition), LBBB  He last saw Dr. Caryl Comes April 2019, he was doing well, no symptoms, no syncope.  No changes made.  Most recently saw A. Lynnell Jude, NP for pre-colonoscopy clearance. Doing well.  He feels well.  NO exertional intolerances, no formal exercise but gets out I the yard and so on.  NO CP, palpitations, no SOB, DOE.  No symptoms of PND or orthopnea.  NO dizzy spells, near syncope or syncope. No bleeding or signs of bleeding.   AF history: Initial diagnosis 1982 Amiodarone +/- 7 years (also with h/o RVOT VT) stopped June 2018 >> permanent AF    Past Medical History:  Diagnosis Date  . Anticoagulated on Coumadin    followed in CVRR at [facility]  . Aortic stenosis    a. Echo 2/15: Severe LVH, EF 55-60%, Gr 1 DD, mild to mod AS (mean 20 mmHg, peak 31 mmHg), AVA 1.4-1.5 cm2, mild AI, MAC, trivial MR, LA upper limits of normal, RVSP 22 mmHg, mild RAE;   b. Echo 2/16:  EF 55-60%, no RWMA, mild AS (mean 17 mmHg, peak 27 mmHg), AVA 1.5 cm2, mild AI, MAC, mild LAE  . Arthritis   . Bladder neoplasm   . Chronic cough    NO CARDIAC OR PULMONARY RELATED  . Coronary artery disease CARDIOLOGIST-  DR KLEIN/ ALLRED   a. s/p CABG 2004, b. LHC with patent grafts 07/2005, ejection fraction 50%;  c. Myoview 2/15: no ischemia, EF 61%  . Dry eyes   . Dyslipidemia   . GERD (gastroesophageal reflux disease)   . HTN (hypertension)   . Mild aortic stenosis   . PAF (paroxysmal atrial fibrillation) ([place])    CHADS2-VASc:  4  . Paroxysmal atrial fibrillation (HCC)   . 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  Amiodarone Rx  . S/P CABG x 5 2004    Past Surgical History:  Procedure Laterality Date  . CARDIAC CATHETERIZATION  07-26-2005  DR GAMBLE   PRESERVED LVF/  EF 50%/ PATENT GRAFTS  . CARDIAC CATHETERIZATION  03-04-2003  DR GAMBLE   SEVERE 3 VESSEL DISEASE  . [place]   PAF/ FALSE POSITIVE STRESS TEST  . CATARACT EXTRACTION W/ INTRAOCULAR LENS IMPLANT Right   . CORONARY ARTERY BYPASS GRAFT  03-08-2003  DR [id number]   LIMA TO LAD/ SVG TO OM2/  SVG TO DIAGONAL / SVG TO PDA & PLA  . CYSTOSCOPY WITH BIOPSY N/A 12/25/2012   Procedure: CYSTOSCOPY WITH BLADDER BIOPSY;  Surgeon: Fredricka Bonine, MD;  Location: [facility];  Service: Urology;  Laterality: N/A;  . ELECTROPHYSIOLOGY STUDY  07-27-2005  DR Carleene Overlie TAYLOR   MAPPING --   RESULT NONINDUCIBLE VT OR SVT/  DX RIGHT VENTRICULAR OUTFLOW TRACT VT AND RULES OUT MORE MALIGNANT CAUSES OF VT  . INGUINAL HERNIA REPAIR Bilateral [place]  . KNEE ARTHROSCOPY Right 1994  . LUMBAR DISC SURGERY  1999   L4 -- L5  . MOHS SURGERY     by Dr. Levada Dy 2019  . REMOVAL VOCAL CORD POLYPS  1978  . TONSILLECTOMY  AS CHILD  . TRANSTHORACIC ECHOCARDIOGRAM  08-08-2011   MILD LVH/  EF 93-81%/  GRADE I DIASTOLIC DYSFUNCTION/ MILD AV STENOSIS    Current Outpatient Medications  Medication Sig Dispense Refill  . acetaminophen (TYLENOL) 325 MG tablet Take 650 mg by mouth once as needed for moderate pain or headache.    Marland Kitchen apixaban (ELIQUIS) 5 MG TABS tablet Take 1 tablet (5 mg total) by mouth 2 (two) times daily. 180 tablet 3  . butalbital-acetaminophen-caffeine (FIORICET, ESGIC) 50-325-40 MG per tablet Take 1 tablet by mouth once as needed for headache.     . cetirizine (ZYRTEC) 10 MG tablet Take 10 mg by mouth daily.    . cholecalciferol (VITAMIN D) 1000 UNITS tablet Take 1,000 Units by mouth daily.    Marland Kitchen diltiazem (CARDIZEM CD) 120 MG 24 hr capsule Take 1 capsule (120 mg) by mouth once daily    . fluticasone  (FLONASE) 50 MCG/ACT nasal spray Place 1 spray into both nostrils daily. 16 g 1  . hydrochlorothiazide (HYDRODIURIL) 25 MG tablet TAKE 1 TABLET BY MOUTH EACH MORNING 90 tablet 3  . Melatonin 3 MG TABS Take by mouth at bedtime.    Vladimir Faster Glycol-Propyl Glycol (SYSTANE ULTRA OP) Apply 1-2 drops to eye once as needed (dry eyes.).     Marland Kitchen potassium chloride (K-DUR) 10 MEQ tablet Take 10 mEq by mouth every morning. Take as directed    . pravastatin (PRAVACHOL) 80 MG tablet Take 0.5 tablets (40 mg total) by mouth daily.     No current facility-administered medications for this visit.    Allergies:   Ace inhibitors, Doxycycline, Hycodan [hydrocodone-homatropine], Oxycodone, Zocor [simvastatin], and Cephalexin   Social History:  The patient  reports that he quit smoking about 39 years ago. His smoking use included cigarettes. He has a 25.00 pack-year smoking history. He has never used smokeless tobacco. He reports that he does not drink alcohol or use drugs.   Family History:  The patient's family history includes Cancer in his mother.  ROS:  Please see the history of present illness.    All other systems are reviewed and otherwise negative.   PHYSICAL EXAM:  VS:  There were no vitals taken for this visit. BMI: There is no height or weight on file to calculate BMI. Well nourished, well developed, in no acute distress  HEENT: normocephalic, atraumatic  Neck: no JVD, carotid bruits or masses Cardiac:  irreg-irreg, soft SM, no rubs, or gallops Lungs:  CTA b/l, no wheezing, rhonchi or rales  Abd: soft, nontender, MS: no deformity, age appropriate atrophy Ext: no edema, palp pedal pulses b/l Skin: warm and dry, no rash Neuro:  No gross deficits appreciated Psych: euthymic mood, full affect     EKG:  Done today and reviewed by myself AFib 95bpm   10/19/2016: TTE Study Conclusions  - Left ventricle: Wall thickness was increased in a pattern of mild  LVH. Systolic function was normal.  The estimated ejection  fraction was in the range of 55% to 60%. Left ventricular  diastolic function parameters were normal.  - Aortic valve: MIld to moderate AS gradients stable since October  2017 There was mild to moderate stenosis. There was mild  regurgitation. Valve area (VTI): 1.02 cm^2. Valve area (Vmax):  0.98 cm^2. Valve area (Vmean): 0.89 cm^2.  - Mitral valve: Calcified annulus.  - Atrial septum: No defect or patent foramen ovale was identified.    2/5/20915: stress myoview Impression Exercise Capacity:  Fair exercise capacity. BP Response:  Normal blood pressure response. Clinical Symptoms:  No chest pain or dyspnea. ECG Impression:  No significant ST segment change suggestive of ischemia. Comparison with Prior Nuclear Study: No images to compare  Overall Impression:  Normal stress nuclear study.  LV Ejection Fraction: 61%.  LV Wall Motion:  NL LV Function; NL Wall Motion   Recent Labs: No results found for requested labs within last 8760 hours.  No results found for requested labs within last 8760 hours.   CrCl cannot be calculated (Patient's most recent lab result is older than the maximum 21 days allowed.).   Wt Readings from Last 3 Encounters:  08/22/19 200 lb (90.7 kg)  06/20/19 201 lb 5 oz (91.3 kg)  04/02/19 198 lb 4 oz (89.9 kg)     Other studies reviewed: Additional studies/records reviewed today include: summarized above  ASSESSMENT AND PLAN:   1. Permanent AFib     CHA2DS2Vasc is at least 4, on Eliquis      Will update labs today  2. CAD (CABG)     stable without symptoms   3. HTN     A recheck was 146/76, he reports routinely at home 130's/70's     No changes   4. VHD, mild-mod AS     Will update his echo  5. Syncope     Post mictiration     Not recurrent   Disposition:  We will continue annual visits, sooner if needed.  He also sees his PMD/[facility] doctors.      Current medicines are reviewed at length with the  patient today.   Haywood Lasso, PA-C 09/01/2019 7:28 AM     [address] [place] [address] [phone] (office)  [phone] (fax)

## 2019-09-02 ENCOUNTER — Other Ambulatory Visit: Payer: Self-pay

## 2019-09-02 ENCOUNTER — Ambulatory Visit (INDEPENDENT_AMBULATORY_CARE_PROVIDER_SITE_OTHER): Payer: Medicare Other | Admitting: Physician Assistant

## 2019-09-02 VITALS — BP 164/70 | HR 95 | Ht 71.0 in | Wt 202.0 lb

## 2019-09-02 DIAGNOSIS — I4821 Permanent atrial fibrillation: Secondary | ICD-10-CM | POA: Diagnosis not present

## 2019-09-02 DIAGNOSIS — I10 Essential (primary) hypertension: Secondary | ICD-10-CM | POA: Diagnosis not present

## 2019-09-02 DIAGNOSIS — Z79899 Other long term (current) drug therapy: Secondary | ICD-10-CM | POA: Diagnosis not present

## 2019-09-02 DIAGNOSIS — I2510 Atherosclerotic heart disease of native coronary artery without angina pectoris: Secondary | ICD-10-CM

## 2019-09-02 DIAGNOSIS — I350 Nonrheumatic aortic (valve) stenosis: Secondary | ICD-10-CM | POA: Diagnosis not present

## 2019-09-02 LAB — BASIC METABOLIC PANEL
BUN/Creatinine Ratio: 13 (ref 10–24)
BUN: 13 mg/dL (ref 8–27)
CO2: 29 mmol/L (ref 20–29)
Calcium: 9.6 mg/dL (ref 8.6–10.2)
Chloride: 101 mmol/L (ref 96–106)
Creatinine, Ser: 0.98 mg/dL (ref 0.76–1.27)
GFR calc Af Amer: 83 mL/min/{1.73_m2} (ref >59)
GFR calc non Af Amer: 72 mL/min/{1.73_m2} (ref >59)
Glucose: 90 mg/dL (ref 65–99)
Potassium: 4 mmol/L (ref 3.5–5.2)
Sodium: 140 mmol/L (ref 134–144)

## 2019-09-02 LAB — CBC
Hematocrit: 42.4 % (ref 37.5–51.0)
Hemoglobin: 14.2 g/dL (ref 13.0–17.7)
MCH: 31.3 pg (ref 26.6–33.0)
MCHC: 33.5 g/dL (ref 31.5–35.7)
MCV: 93 fL (ref 79–97)
Platelets: 223 10*3/uL (ref 150–450)
RBC: 4.54 x10E6/uL (ref 4.14–5.80)
RDW: 12.9 % (ref 11.6–15.4)
WBC: 5.8 10*3/uL (ref 3.4–10.8)

## 2019-09-02 NOTE — Patient Instructions (Signed)
Medication Instructions:   Your physician recommends that you continue on your current medications as directed. Please refer to the Current Medication list given to you today.  *If you need a refill on your cardiac medications before your next appointment, please call your pharmacy*   Lab Work:  BMET AND CBC  TODAY   If you have labs (blood work) drawn today and your tests are completely normal, you will receive your results only by: Marland Kitchen MyChart Message (if you have MyChart) OR . A paper copy in the mail If you have any lab test that is abnormal or we need to change your treatment, we will call you to review the results.   Testing/Procedures: Your physician has requested that you have an echocardiogram. Echocardiography is a painless test that uses sound waves to create images of your heart. It provides your doctor with information about the size and shape of your heart and how well your heart's chambers and valves are working. This procedure takes approximately one hour. There are no restrictions for this procedure.  Follow-Up: At [facility], you and your health needs are our priority.  As part of our continuing mission to provide you with exceptional heart care, we have created designated Provider Care Teams.  These Care Teams include your primary Cardiologist (physician) and Advanced Practice Providers (APPs -  Physician Assistants and Nurse Practitioners) who all work together to provide you with the care you need, when you need it.  We recommend signing up for the patient portal called "MyChart".  Sign up information is provided on this After Visit Summary.  MyChart is used to connect with patients for Virtual Visits (Telemedicine).  Patients are able to view lab/test results, encounter notes, upcoming appointments, etc.  Non-urgent messages can be sent to your provider as well.   To learn more about what you can do with MyChart, go to [email or web address].    Your next appointment:    1 year(s)  The format for your next appointment:   In Person  Provider:   DR Caryl Comes ONLY PATIENT REQUEST  HASNT SEEN MD IN A COUPLE OF YEARS   Other Instructions

## 2019-09-11 ENCOUNTER — Other Ambulatory Visit: Payer: Self-pay

## 2019-09-11 ENCOUNTER — Ambulatory Visit (INDEPENDENT_AMBULATORY_CARE_PROVIDER_SITE_OTHER)
Admission: RE | Admit: 2019-09-11 | Discharge: 2019-09-11 | Disposition: A | Discharge status: Home / Self Care | Payer: Medicare Other | Source: Ambulatory Visit | Attending: Family Medicine | Admitting: Family Medicine

## 2019-09-11 ENCOUNTER — Ambulatory Visit (INDEPENDENT_AMBULATORY_CARE_PROVIDER_SITE_OTHER): Payer: Medicare Other | Admitting: Family Medicine

## 2019-09-11 ENCOUNTER — Encounter: Payer: Self-pay | Admitting: Family Medicine

## 2019-09-11 VITALS — BP 130/80 | HR 86 | Temp 98.5°F | Ht 71.0 in | Wt 198.0 lb

## 2019-09-11 DIAGNOSIS — I2510 Atherosclerotic heart disease of native coronary artery without angina pectoris: Secondary | ICD-10-CM | POA: Diagnosis not present

## 2019-09-11 DIAGNOSIS — G629 Polyneuropathy, unspecified: Secondary | ICD-10-CM | POA: Diagnosis not present

## 2019-09-11 DIAGNOSIS — M19071 Primary osteoarthritis, right ankle and foot: Secondary | ICD-10-CM | POA: Diagnosis not present

## 2019-09-11 DIAGNOSIS — R351 Nocturia: Secondary | ICD-10-CM | POA: Diagnosis not present

## 2019-09-11 DIAGNOSIS — R3911 Hesitancy of micturition: Secondary | ICD-10-CM | POA: Diagnosis not present

## 2019-09-11 DIAGNOSIS — M79671 Pain in right foot: Secondary | ICD-10-CM | POA: Insufficient documentation

## 2019-09-11 DIAGNOSIS — N200 Calculus of kidney: Secondary | ICD-10-CM | POA: Diagnosis not present

## 2019-09-11 DIAGNOSIS — N401 Enlarged prostate with lower urinary tract symptoms: Secondary | ICD-10-CM | POA: Diagnosis not present

## 2019-09-11 MED ORDER — GABAPENTIN 100 MG PO CAPS
100.0000 mg | ORAL_CAPSULE | Freq: Two times a day (BID) | ORAL | 1 refills | Status: DC
Start: 2019-09-11 — End: 2019-12-17

## 2019-09-11 NOTE — Patient Instructions (Signed)
I think you are having some nerve pain in feet-particularly the right foot   (neuropathy is possible)   Since it is worse on the right- I want to get an xray to make sure nothing else is going on  We will call you with results    Try gabapentin 100 mg twice daily (am and pm)- if any intolerable side effects please stop it and let us know Then please let us know in 1-2 weeks how you are doing and we can make a further plan

## 2019-09-11 NOTE — Assessment & Plan Note (Signed)
Acute on chronic symptoms-worse in R foot today  Much worse when not active  Unsure of cause-has been addressed in the past May want to consider neuro eval / NCV testing   Xray of foot done to r/o other injury (like stress reaction)  Px gabapentin to try 100 mg bid (disc poss side eff like sedation/ dizziness or mood change) inst to hold med if intolerable side effects  Otherwise-update in a week so we can titrate if needed

## 2019-09-11 NOTE — Assessment & Plan Note (Signed)
Symptoms of neuropathy-more focal in R lateral foot  Mild tenderness of 5th MTP as well  Xray done- pending reading

## 2019-09-11 NOTE — Progress Notes (Signed)
Subjective:    Patient ID: [name] [name], male    DOB: Sep 10, 1934, 85 y.o.   MRN: [id number]  This visit occurred during the SARS-CoV-2 public health emergency.  Safety protocols were in place, including screening questions prior to the visit, additional usage of staff PPE, and extensive cleaning of exam room while observing appropriate contact time as indicated for disinfecting solutions.    HPI  85 yo pt of [name] presents with c/o right food pain  He has a known hx of sciatica and neuropathy and OA  Wt Readings from Last 3 Encounters:  09/11/19 198 lb (89.8 kg)  09/02/19 202 lb (91.6 kg)  08/22/19 200 lb (90.7 kg)   27.62 kg/m   Off and on for years Now worse- ? Neuropathy   R foot    (seldom but occasional in other foot)  Ok in am  Later in the day - lateral foot has a burning pain "on fire"  Also numbness and tingling  It comes and goes in cycles every 20 seconds or so  occ foot will swell  ? Some discoloration  No injury known   No h/o gout   No h/o PAD    Ok when moving and busy - then worse at night or when still   On blood thinner-cannot take nsaids   Back is not bothering him now   Lab Results  Component Value Date   TSH 2.02 12/20/2017    Lab Results  Component Value Date   VITAMINB12 359 10/06/2017      Had ankle xr 9/19: DG Ankle Complete Right (Accession [id number]) (Order [id number]) Imaging Date: 01/04/2018 Department: [place] [facility] Ordering/Authorizing: Tonia Ghent, MD  Exam Status  Status  Final [99]  PACS Intelerad Image Link  Show images for DG Ankle Complete Right  Study Result  CLINICAL DATA:  Ankle pain of unspecified chronicity.  EXAM: RIGHT ANKLE - COMPLETE 3+ VIEW  COMPARISON:  None.  FINDINGS: There is no evidence of fracture, dislocation, or joint effusion. Diffuse arterial calcification.  IMPRESSION: No acute finding.   Electronically Signed   By: Monte Fantasia  M.D.   On: 01/05/2018 08:11     BP Readings from Last 3 Encounters:  09/11/19 130/80  09/02/19 (!) 164/70  08/22/19 116/75   Pulse Readings from Last 3 Encounters:  09/11/19 86  09/02/19 95  08/22/19 93   Patient Active Problem List   Diagnosis Date Noted  . Right foot pain 09/11/2019  . Leg pain 06/23/2019  . Muscle pain 04/04/2019  . Sciatica 12/28/2017  . Neuropathy 10/07/2017  . Foot swelling 10/07/2017  . Shoulder pain 03/29/2017  . CAD (coronary artery disease), autologous vein bypass graft 10/31/2016  . Family history of colon cancer 10/31/2016  . History of adenomatous polyp of colon 10/31/2016  . Osteoarthritis 10/31/2016  . Atrial fibrillation ([place]) 10/31/2016  . Syncope 02/11/2016  . High risk medication use   . Skin lesion 06/04/2015  . Lower back pain 05/01/2015  . Elevated TSH 12/26/2014  . Back pain 01/22/2014  . Aortic stenosis 05/16/2013  . Bladder neoplasm 11/23/2012  . BCC (basal cell carcinoma of skin) 08/29/2012  . Vertigo 08/24/2012  . GERD (gastroesophageal reflux disease) 05/01/2012  . Hyperlipidemia 03/14/2012  . TMJ pain dysfunction syndrome 01/20/2012  . Long term (current) use of anticoagulants 08/31/2011  . RVOT ventricular tachycardia ([place])   . Cough 04/29/2009  . HEARING LOSS, SUDDEN 12/03/2007  . Essential hypertension  02/22/2007  . PAF (paroxysmal atrial fibrillation) ([place]) 02/22/2007   Past Medical History:  Diagnosis Date  . Anticoagulated on Coumadin    followed in [facility]RR at [facility]  . Aortic stenosis    a. Echo 2/15: Severe LVH, EF 55-60%, Gr 1 DD, mild to mod AS (mean 20 mmHg, peak 31 mmHg), AVA 1.4-1.5 cm2, mild AI, MAC, trivial MR, LA upper limits of normal, RVSP 22 mmHg, mild RAE;   b. Echo 2/16:  EF 55-60%, no RWMA, mild AS (mean 17 mmHg, peak 27 mmHg), AVA 1.5 cm2, mild AI, MAC, mild LAE  . Arthritis   . Bladder neoplasm   . Chronic cough    NO CARDIAC OR PULMONARY RELATED  . Coronary artery disease  CARDIOLOGIST-  DR KLEIN/ ALLRED   a. s/p CABG 2004, b. LHC with patent grafts 07/2005, ejection fraction 50%;  c. Myoview 2/15: no ischemia, EF 61%  . Dry eyes   . Dyslipidemia   . GERD (gastroesophageal reflux disease)   . HTN (hypertension)   . Mild aortic stenosis   . PAF (paroxysmal atrial fibrillation) ([place])    CHADS2-VASc:  4  . Paroxysmal atrial fibrillation (HCC)   . 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)    a. Amiodarone Rx  . S/P CABG x 5 2004   Past Surgical History:  Procedure Laterality Date  . CARDIAC CATHETERIZATION  07-26-2005  DR GAMBLE   PRESERVED LVF/  EF 50%/ PATENT GRAFTS  . CARDIAC CATHETERIZATION  03-04-2003  DR GAMBLE   SEVERE 3 VESSEL DISEASE  . [place]   PAF/ FALSE POSITIVE STRESS TEST  . CATARACT EXTRACTION W/ INTRAOCULAR LENS IMPLANT Right   . CORONARY ARTERY BYPASS GRAFT  03-08-2003  DR [id number]   LIMA TO LAD/ SVG TO OM2/  SVG TO DIAGONAL / SVG TO PDA & PLA  . CYSTOSCOPY WITH BIOPSY N/A 12/25/2012   Procedure: CYSTOSCOPY WITH BLADDER BIOPSY;  Surgeon: Fredricka Bonine, MD;  Location: [facility];  Service: Urology;  Laterality: N/A;  . ELECTROPHYSIOLOGY STUDY  07-27-2005  DR Carleene Overlie TAYLOR   MAPPING --   RESULT NONINDUCIBLE VT OR SVT/  DX RIGHT VENTRICULAR OUTFLOW TRACT VT AND RULES OUT MORE MALIGNANT CAUSES OF VT  . INGUINAL HERNIA REPAIR Bilateral [place]  . KNEE ARTHROSCOPY Right 1994  . LUMBAR DISC SURGERY  1999   L4 -- L5  . MOHS SURGERY     by Dr. Levada Dy 2019  . REMOVAL VOCAL CORD POLYPS  1978  . TONSILLECTOMY  AS CHILD  . TRANSTHORACIC ECHOCARDIOGRAM  08-08-2011   MILD LVH/  EF 32-95%/  GRADE I DIASTOLIC DYSFUNCTION/ MILD AV STENOSIS   Social History   Tobacco Use  . Smoking status: Former Smoker    Packs/day: 1.00    Years: 25.00    Pack years: 25.00    Types: Cigarettes    Quit date: 04/25/1980    Years since quitting: 39.4  . Smokeless  tobacco: Never Used  Substance Use Topics  . Alcohol use: No    Alcohol/week: 0.0 standard drinks  . Drug use: No   Family History  Problem Relation Age of Onset  . Cancer Mother    Allergies  Allergen Reactions  . Ace Inhibitors Other (See Comments)    COUGH  . Doxycycline     Nausea and abd pain  . Hycodan [Hydrocodone-Homatropine] Nausea And Vomiting  . Oxycodone Nausea Only  Pt states that he cannot take prescription pain medication  . Zocor [Simvastatin] Other (See Comments)    MYALGIA  . Cephalexin Rash   Current Outpatient Medications on File Prior to Visit  Medication Sig Dispense Refill  . acetaminophen (TYLENOL) 325 MG tablet Take 650 mg by mouth once as needed for moderate pain or headache.    Marland Kitchen apixaban (ELIQUIS) 5 MG TABS tablet Take 1 tablet (5 mg total) by mouth 2 (two) times daily. 180 tablet 3  . butalbital-acetaminophen-caffeine (FIORICET, ESGIC) 50-325-40 MG per tablet Take 1 tablet by mouth once as needed for headache.     . cholecalciferol (VITAMIN D) 1000 UNITS tablet Take 1,000 Units by mouth daily.    Marland Kitchen diltiazem (CARDIZEM CD) 120 MG 24 hr capsule Take 1 capsule (120 mg) by mouth once daily    . fluticasone (FLONASE) 50 MCG/ACT nasal spray Place 1 spray into both nostrils daily. 16 g 1  . hydrochlorothiazide (HYDRODIURIL) 25 MG tablet TAKE 1 TABLET BY MOUTH EACH MORNING 90 tablet 3  . loratadine (CLARITIN) 10 MG tablet Take 10 mg by mouth daily.    . Melatonin 3 MG TABS Take by mouth at bedtime.    Vladimir Faster Glycol-Propyl Glycol (SYSTANE ULTRA OP) Apply 1-2 drops to eye once as needed (dry eyes.).     Marland Kitchen potassium chloride (K-DUR) 10 MEQ tablet Take 10 mEq by mouth every morning. Take as directed    . pravastatin (PRAVACHOL) 80 MG tablet Take 0.5 tablets (40 mg total) by mouth daily.     No current facility-administered medications on file prior to visit.    Review of Systems  Constitutional: Negative for activity change, appetite change, fatigue,  fever and unexpected weight change.  HENT: Negative for congestion, rhinorrhea, sore throat and trouble swallowing.   Eyes: Negative for pain, redness, itching and visual disturbance.  Respiratory: Negative for cough, chest tightness, shortness of breath and wheezing.   Cardiovascular: Negative for chest pain and palpitations.  Gastrointestinal: Negative for abdominal pain, blood in stool, constipation, diarrhea and nausea.  Endocrine: Negative for cold intolerance, heat intolerance, polydipsia and polyuria.  Genitourinary: Negative for difficulty urinating, dysuria, frequency and urgency.  Musculoskeletal: Positive for arthralgias. Negative for gait problem, joint swelling and myalgias.  Skin: Negative for pallor and rash.  Neurological: Positive for numbness. Negative for dizziness, tremors, weakness and headaches.       Burning/tingling sometimes numb feeling worst in R foot  occ in both  Hematological: Negative for adenopathy. Does not bruise/bleed easily.  Psychiatric/Behavioral: Negative for decreased concentration and dysphoric mood. The patient is not nervous/anxious.        Objective:   Physical Exam Constitutional:      General: He is not in acute distress.    Appearance: Normal appearance. He is normal weight. He is not ill-appearing.  HENT:     Head: Normocephalic and atraumatic.  Eyes:     General:        Right eye: No discharge.        Left eye: No discharge.     Conjunctiva/sclera: Conjunctivae normal.     Pupils: Pupils are equal, round, and reactive to light.  Cardiovascular:     Rate and Rhythm: Normal rate.     Pulses: Normal pulses.  Pulmonary:     Effort: Pulmonary effort is normal. No respiratory distress.     Breath sounds: Normal breath sounds. No wheezing.  Musculoskeletal:        General: Tenderness present.  Cervical back: No rigidity.     Right lower leg: No edema.     Left lower leg: No edema.     Right foot: Normal range of motion and normal  capillary refill. Tenderness and bony tenderness present. No swelling, Charcot foot, foot drop or crepitus.     Left foot: Normal.     Comments: Tenderness over lateral R foot (5th metatarsal)  No crepitus or step off   Skin:    General: Skin is warm and dry.     Coloration: Skin is not pale.     Findings: No erythema or rash.  Neurological:     Mental Status: He is alert.     Sensory: Sensory deficit present.     Motor: No weakness, tremor, atrophy or abnormal muscle tone.     Coordination: Coordination normal.     Gait: Gait is intact.     Deep Tendon Reflexes: Reflexes normal.     Comments: Decreased sens to lt touch and temp in R foot              Assessment & Plan:   Problem List Items Addressed This Visit      Nervous and Auditory   Neuropathy - Primary    Acute on chronic symptoms-worse in R foot today  Much worse when not active  Unsure of cause-has been addressed in the past May want to consider neuro eval / NCV testing   Xray of foot done to r/o other injury (like stress reaction)  Px gabapentin to try 100 mg bid (disc poss side eff like sedation/ dizziness or mood change) inst to hold med if intolerable side effects  Otherwise-update in a week so we can titrate if needed       Relevant Orders   DG Foot Complete Right     Other   Right foot pain    Symptoms of neuropathy-more focal in R lateral foot  Mild tenderness of 5th MTP as well  Xray done- pending reading      Relevant Orders   DG Foot Complete Right

## 2019-09-25 ENCOUNTER — Other Ambulatory Visit: Payer: Self-pay

## 2019-09-25 ENCOUNTER — Ambulatory Visit (HOSPITAL_COMMUNITY): Payer: Medicare Other | Attending: Cardiology

## 2019-09-25 ENCOUNTER — Telehealth: Payer: Self-pay | Admitting: Internal Medicine

## 2019-09-25 DIAGNOSIS — I4821 Permanent atrial fibrillation: Secondary | ICD-10-CM | POA: Insufficient documentation

## 2019-09-25 NOTE — Telephone Encounter (Signed)
Patient was reading his echo results on his MyChart and was not sure how to understand it. He would like someone to call him to go over those results if possible

## 2019-09-25 NOTE — Telephone Encounter (Signed)
Informed the pt that the preliminary report has been read on his echo, but this still has to be read by the ordering Provider, Tommye Standard PA-C. Informed the pt that Renee needs to provide her final interpretation on this study.  Informed the pt that once Renee reviews his echo and further interprets, her covering CMA or a Nurse from the office, will call him shortly thereafter, to endorse these results.  Pt verbalized understanding and agrees with this plan.

## 2019-10-21 DIAGNOSIS — L821 Other seborrheic keratosis: Secondary | ICD-10-CM | POA: Diagnosis not present

## 2019-10-21 DIAGNOSIS — D485 Neoplasm of uncertain behavior of skin: Secondary | ICD-10-CM | POA: Diagnosis not present

## 2019-10-21 DIAGNOSIS — C44519 Basal cell carcinoma of skin of other part of trunk: Secondary | ICD-10-CM | POA: Diagnosis not present

## 2019-10-21 DIAGNOSIS — L820 Inflamed seborrheic keratosis: Secondary | ICD-10-CM | POA: Diagnosis not present

## 2019-10-21 DIAGNOSIS — C44319 Basal cell carcinoma of skin of other parts of face: Secondary | ICD-10-CM | POA: Diagnosis not present

## 2019-10-21 DIAGNOSIS — L570 Actinic keratosis: Secondary | ICD-10-CM | POA: Diagnosis not present

## 2019-11-11 DIAGNOSIS — Z85828 Personal history of other malignant neoplasm of skin: Secondary | ICD-10-CM | POA: Diagnosis not present

## 2019-11-11 DIAGNOSIS — C44319 Basal cell carcinoma of skin of other parts of face: Secondary | ICD-10-CM | POA: Diagnosis not present

## 2019-11-15 ENCOUNTER — Telehealth: Payer: Self-pay | Admitting: Family Medicine

## 2019-11-15 NOTE — Telephone Encounter (Signed)
Pt called wanting to speak to [facility] number [phone]

## 2019-11-15 NOTE — Telephone Encounter (Signed)
Called patient back at number he stated.  LM on VM that I was returning his call and to call me back on Monday if not able to reach [place] this pm before phones turn off at 5.

## 2019-11-15 NOTE — Telephone Encounter (Signed)
Patient called wanting Leafy Ro to give him a call back at [phone] whenever she has a minute.

## 2019-11-20 NOTE — Telephone Encounter (Signed)
I spoke with patient.  He is concerned with the charge he received for his PART 1 AWV visit that was virtual on 08/22/19.   He says he was charged 425.00 for the visit which was a phone call and didn't even last 10 minutes.  He feels like this was a steep price and he still has yet to see Dr. Damita Dunnings for his part 2.   Patient was very pleasant and understanding but just was unclear why [facility] would charge so much for that visit.    He does need to be set up for his annual with PCP and in meantime, I am having my office supervisor investigate the charge and why he was not scheduled for the [place].  He does say that Medicare paid the bill at 100% but he still does not understand why it was so high to begin with.   We will get back in touch with follow up details and to set up the physical.   FYI to Dr. Damita Dunnings and copy to Donzetta Matters to follow up on charge.   Thanks.

## 2019-11-20 NOTE — Telephone Encounter (Signed)
Please let me know if I need to do anything here.  Thanks.

## 2019-11-25 NOTE — Telephone Encounter (Signed)
I called and got patient scheduled for part 2 CPE and labs. Patient would still like a follow up call with [facility] on charges.

## 2019-11-25 NOTE — Telephone Encounter (Signed)
Please let me know if I need to do anything here.  Thanks.

## 2019-11-26 NOTE — Telephone Encounter (Signed)
I have reached out to billing. I am just waiting to hear back. When I get back into the office, I will reach out to patient to give him an update.

## 2019-12-01 ENCOUNTER — Other Ambulatory Visit: Payer: Self-pay | Admitting: Family Medicine

## 2019-12-01 DIAGNOSIS — I10 Essential (primary) hypertension: Secondary | ICD-10-CM

## 2019-12-10 ENCOUNTER — Other Ambulatory Visit: Payer: Self-pay

## 2019-12-10 ENCOUNTER — Other Ambulatory Visit (INDEPENDENT_AMBULATORY_CARE_PROVIDER_SITE_OTHER): Payer: Medicare Other

## 2019-12-10 DIAGNOSIS — I10 Essential (primary) hypertension: Secondary | ICD-10-CM

## 2019-12-10 LAB — LIPID PANEL
Cholesterol: 167 mg/dL (ref 0–200)
HDL: 54 mg/dL (ref >39.00)
LDL Cholesterol: 92 mg/dL (ref 0–99)
NonHDL: 112.75
Total CHOL/HDL Ratio: 3
Triglycerides: 104 mg/dL (ref 0.0–149.0)
VLDL: 20.8 mg/dL (ref 0.0–40.0)

## 2019-12-10 LAB — COMPREHENSIVE METABOLIC PANEL
ALT: 11 U/L (ref 0–53)
AST: 19 U/L (ref 0–37)
Albumin: 4.3 g/dL (ref 3.5–5.2)
Alkaline Phosphatase: 66 U/L (ref 39–117)
BUN: 16 mg/dL (ref 6–23)
CO2: 33 mEq/L — ABNORMAL HIGH (ref 19–32)
Calcium: 9.8 mg/dL (ref 8.4–10.5)
Chloride: 100 mEq/L (ref 96–112)
Creatinine, Ser: 0.97 mg/dL (ref 0.40–1.50)
GFR: 73.87 mL/min (ref >60.00)
Glucose, Bld: 101 mg/dL — ABNORMAL HIGH (ref 70–99)
Potassium: 4 mEq/L (ref 3.5–5.1)
Sodium: 140 mEq/L (ref 135–145)
Total Bilirubin: 0.8 mg/dL (ref 0.2–1.2)
Total Protein: 7 g/dL (ref 6.0–8.3)

## 2019-12-10 LAB — TSH: TSH: 3.14 u[IU]/mL (ref 0.35–4.50)

## 2019-12-17 ENCOUNTER — Encounter: Payer: Self-pay | Admitting: Family Medicine

## 2019-12-17 ENCOUNTER — Other Ambulatory Visit: Payer: Self-pay

## 2019-12-17 ENCOUNTER — Ambulatory Visit (INDEPENDENT_AMBULATORY_CARE_PROVIDER_SITE_OTHER)
Admission: RE | Admit: 2019-12-17 | Discharge: 2019-12-17 | Disposition: A | Discharge status: Home / Self Care | Payer: Medicare Other | Source: Ambulatory Visit | Attending: Family Medicine | Admitting: Family Medicine

## 2019-12-17 ENCOUNTER — Ambulatory Visit (INDEPENDENT_AMBULATORY_CARE_PROVIDER_SITE_OTHER): Payer: Medicare Other | Admitting: Family Medicine

## 2019-12-17 VITALS — BP 132/82 | HR 93 | Temp 97.5°F | Ht 70.0 in | Wt 192.4 lb

## 2019-12-17 DIAGNOSIS — R05 Cough: Secondary | ICD-10-CM | POA: Diagnosis not present

## 2019-12-17 DIAGNOSIS — E785 Hyperlipidemia, unspecified: Secondary | ICD-10-CM

## 2019-12-17 DIAGNOSIS — K5900 Constipation, unspecified: Secondary | ICD-10-CM

## 2019-12-17 DIAGNOSIS — M79671 Pain in right foot: Secondary | ICD-10-CM

## 2019-12-17 DIAGNOSIS — I10 Essential (primary) hypertension: Secondary | ICD-10-CM | POA: Diagnosis not present

## 2019-12-17 DIAGNOSIS — R059 Cough, unspecified: Secondary | ICD-10-CM

## 2019-12-17 DIAGNOSIS — I4891 Unspecified atrial fibrillation: Secondary | ICD-10-CM

## 2019-12-17 DIAGNOSIS — I2510 Atherosclerotic heart disease of native coronary artery without angina pectoris: Secondary | ICD-10-CM | POA: Diagnosis not present

## 2019-12-17 MED ORDER — HYDROCHLOROTHIAZIDE 25 MG PO TABS: ORAL_TABLET | ORAL | 3 refills | Status: DC

## 2019-12-17 NOTE — Progress Notes (Signed)
This visit occurred during the SARS-CoV-2 public health emergency.  Safety protocols were in place, including screening questions prior to the visit, additional usage of staff PPE, and extensive cleaning of exam room while observing appropriate contact time as indicated for disinfecting solutions.  Elevated Cholesterol: Using medications without problems: yes Muscle aches: no Diet compliance: yes Exercise: yes Labs d/w pt.   Still on pravastatin.    H/o constipation.  Had seen Dr. Earlean Shawl.  benefiber didn't help.  Miralax was overly effective.  D/w pt about trying a lower dose of prn miralax.    Hypertension:    Using medication without problems or lightheadedness: yes Chest pain with exertion:no Edema: some BLE, R>L. Short of breath: no Previous episode of nausea and vertigo discussed with patient.  Sx prev were brief, but this most recent episode lasted longer than usual.  He isn't lightheaded on standing but cautions d/w pt.    He had seen urology this year.  I will defer.  He agrees.  Still with sig R ankle and foot pain, getting worse.  Couldn't tolerate gabapentin.  He has prev imaging done.    He has some occ R lower back pain that can radiate down the R leg.  Unclear if this is related to his foot pain.    Still anticoagulated.  No bleeding.  Compliant with Eliquis.  Still with a cough.  Longstanding.  Not better.  Not worse.  Sputum noted occ.  Year round sx.  No help with flonase.  He had seen ENT and pulmonary in the past.    PMH and SH reviewed  Meds, vitals, and allergies reviewed.   ROS: Per HPI unless specifically indicated in ROS section   GEN: nad, alert and oriented HEENT: ncat NECK: supple w/o LA CV: IRR.   PULM: ctab, no inc wob ABD: soft, +bs EXT: no edema SKIN: no acute rash

## 2019-12-17 NOTE — Telephone Encounter (Signed)
Pt would like for you to call him. He said it is concerning the bill but also something else. He did not go into detail just asking for you to call him.

## 2019-12-17 NOTE — Telephone Encounter (Signed)
Spoke with patient to make him aware that billing did confirm the cost charged for th Medicare Wellness visit that he had over the phone is the 425.00 amount.  His insurance did pay for it in full at no additional cost to him, but that is the amount that is normally billed if the documentation (which it did) meet all of the criteria.   He verbalized understanding and has no further questions about that.   He did want to ask about patient assistance/cost options for patients that do not have insurance and who are on a low, fixed income.  I gave him the general detail information and will place a patient assistance packet for him up front for pick up. He is asking for general information for his son who is needing to establish.   He thanks me for the return call and will have his son reach back out to me if he wishes to pursue care further.

## 2019-12-17 NOTE — Patient Instructions (Addendum)
We'll call about seeing orthopedics about your foot.   Go to the lab on the way out.   If you have mychart we'll likely use that to update you.   Let me check on options for the cough.    Try taking 1/2 tab of HCTZ and see if you feel better.   Take care.  Glad to see you.

## 2019-12-17 NOTE — Telephone Encounter (Signed)
Please refer to my chart encounter and phone documentation in that encounter regarding follow up and resolution to this matter.

## 2019-12-18 NOTE — Telephone Encounter (Signed)
Noted. Thanks.

## 2019-12-22 ENCOUNTER — Other Ambulatory Visit: Payer: Self-pay | Admitting: Family Medicine

## 2019-12-22 DIAGNOSIS — K5900 Constipation, unspecified: Secondary | ICD-10-CM | POA: Insufficient documentation

## 2019-12-22 MED ORDER — PREDNISONE 10 MG PO TABS
10.0000 mg | ORAL_TABLET | Freq: Every day | ORAL | 0 refills | Status: DC
Start: 2019-12-22 — End: 2020-02-11

## 2019-12-22 NOTE — Assessment & Plan Note (Signed)
Lipids are reasonable.  Would continue pravastatin.

## 2019-12-22 NOTE — Assessment & Plan Note (Signed)
H/o constipation.  Had seen Dr. Earlean Shawl.  benefiber didn't help.  Miralax was overly effective.  D/w pt about trying a lower dose of prn miralax.

## 2019-12-22 NOTE — Assessment & Plan Note (Signed)
Reasonable to refer to orthopedics.  He agrees.  Unclear if he has a primary issue in the foot or if he has a separate secondary issue that is radiating down the leg.  I need orthopedic input.

## 2019-12-22 NOTE — Assessment & Plan Note (Signed)
Tolerating anticoagulation.  Would continue Eliquis and diltiazem.  He agrees with plan.

## 2019-12-22 NOTE — Assessment & Plan Note (Addendum)
He can try decreasing hydrochlorothiazide down to half pill a day and see how he feels.  See after visit summary.  He agrees.

## 2019-12-22 NOTE — Assessment & Plan Note (Addendum)
Not on ACE or ARB.  Has been using Flonase and loratadine.  Longstanding and year-round.  He had seen pulmonary and ENT in the past.  Unclear source.  See notes on imaging.  Check chest x-ray today.

## 2019-12-24 ENCOUNTER — Telehealth: Payer: Self-pay | Admitting: *Deleted

## 2019-12-24 NOTE — Telephone Encounter (Signed)
Pt left message. He just wanted [name] [name] and PCP know he got their message regarding xray and he has picked up prednisone and started today  No CB needed he just wanted to make sure [name] [name] knew

## 2019-12-26 DIAGNOSIS — M25551 Pain in right hip: Secondary | ICD-10-CM | POA: Diagnosis not present

## 2019-12-26 DIAGNOSIS — M545 Low back pain: Secondary | ICD-10-CM | POA: Diagnosis not present

## 2020-01-02 ENCOUNTER — Encounter: Payer: Self-pay | Admitting: Family Medicine

## 2020-01-11 ENCOUNTER — Encounter: Payer: Self-pay | Admitting: Family Medicine

## 2020-02-10 ENCOUNTER — Telehealth: Payer: Self-pay | Admitting: Internal Medicine

## 2020-02-10 ENCOUNTER — Telehealth: Payer: Self-pay

## 2020-02-10 NOTE — Telephone Encounter (Signed)
Agree. Thanks

## 2020-02-10 NOTE — Telephone Encounter (Signed)
[facility] - Client TELEPHONE ADVICE RECORD [facility] Patient Name: [name] [name] Gender: Male DOB: 09/25/1934 Age: 85 Y 69 M 28 D Return Phone Number: [phone] (Primary) Address: City/State/Zip: [place] [address] Client [facility] - Client Client Site [place] - Day Physician Renford Dills - MD Contact Type Call Who Is Calling Patient / Member / Family / Caregiver Call Type Triage / Clinical Relationship To Patient Self Return Phone Number [phone] (Primary) Chief Complaint Double Vision Reason for Call Symptomatic / Request for Health Information Initial Comment Office states patient is dizzy and double vision. Caller reports he is jittery and his stomach is bothering him. Additional Comment Caller states he refuses to go to ED Translation No Nurse Assessment Nurse: Hardin Negus, RN, Mardene Celeste Date/Time Eilene Ghazi Time): 02/10/2020 9:30:45 AM Confirm and document reason for call. If symptomatic, describe symptoms. ---Office states patient is dizzy and double vision. He is jittery and his stomach is bothering him. The s&s have been going for the last couple of days but last night was terrible. He is taking medication for constipation and he is wondering if it is causing the dizziness. He also AFIB. Does the patient have any new or worsening symptoms? ---Yes Will a triage be completed? ---Yes Related visit to physician within the last 2 weeks? ---No Does the PT have any chronic conditions? (i.e. diabetes, asthma, this includes High risk factors for pregnancy, etc.) ---Yes List chronic conditions. ---AFIB Is this a behavioral health or substance abuse call? ---No Guidelines Guideline Title Affirmed Question Affirmed Notes Nurse Date/Time (Eastern Time) Dizziness - Lightheadedness SEVERE dizziness (e.g., unable to stand, requires support to walk, feels like passing out  now) Hardin Negus, Insurance claims handler 02/10/2020 9:35:23 AM Disp. Time Eilene Ghazi Time) Disposition Final User 02/10/2020 9:39:48 AM Go to ED Now (or PCP triage) Yes Hardin Negus, RN, Harlon Flor NOTE: All timestamps contained within this report are represented as [place] Standard Time. CONFIDENTIALTY NOTICE: This fax transmission is intended only for the addressee. It contains information that is legally privileged, confidential or otherwise protected from use or disclosure. If you are not the intended recipient, you are strictly prohibited from reviewing, disclosing, copying using or disseminating any of this information or taking any action in reliance on or regarding this information. If you have received this fax in error, please notify us immediately by telephone so that we can arrange for its return to [place]. Phone: [phone], Toll-Free: [phone], Fax: [phone] Page: 2 of 2 Call Id: [id number] [place] Disagree/Comply Disagree Caller Understands Yes PreDisposition Call Doctor Care Advice Given Per Guideline GO TO ED NOW (OR PCP TRIAGE): CARE ADVICE given per Dizziness (Adult) guideline. Referrals GO TO FACILITY REFUSED

## 2020-02-10 NOTE — Progress Notes (Signed)
Cardiology Office Note Date:  02/10/2020  Patient ID:  [name], [name] 1934/12/28, MRN [id number] PCP:  Tonia Ghent, MD  Cardiologist:  Dr. Caryl Comes   Chief Complaint:  dizziness  History of Present Illness: [name] [name] is a 85 y.o. male with history of CAD (remote CABG), mild AS, VT (RVOT tx with amiodarone), PAFib, syncope (pust micturition), LBBB  He last saw Dr. Caryl Comes April 2019, he was doing well, no symptoms, no syncope.  No changes made.  Most recently saw A. Lynnell Jude, NP for pre-colonoscopy clearance. Doing well.  I saw him may 2021 He feels well.  No exertional intolerances, no formal exercise but gets out I the yard and so on.  NO CP, palpitations, no SOB, DOE.  No symptoms of PND or orthopnea.  NO dizzy spells, near syncope or syncope. No bleeding or signs of bleeding. Labs were updated and stable for his Eliquis, appropriately dosed Echo was updated w/LVEF 60-65%, mild conc LVH Mod reduced RV function (noted in 2017 as well), normal pulm pressures, mild MR, mild AI, mod AS (mead gradient 23) Ascending Ao mildly dilated 26m   TODAY Patient called [place] his PM[place] office yesterday with dizziness, perhaps double vision/blurred vision and reports of 170/100 (prior to his HCTZ and dilt) it seems.  [facility], he was also given an appt to be seen here by our office. He reported compliance with his eliquis.  He mentions a number of symptoms/observations:  1. For a couple of week he has had some dizziness. This is very particularly when standing from a bent over or stooped position especially. He gives an example that a few days ago, last week was doing quite a lot of yard work, cutting back bushes and cleaning out debris from under them, a lot of raking and clearing.  When he would stand up from being under the bushes and clearing things he would feel a bit dizzy or off balance for a few moments. He does say that  for years, when he first gets up in the morning or after seated for a long time he will sit up for a 30 seconds or so with h/u of getting a lilttle lightheaded with standing. August he mentioned this awas a little more then usual yo his PMD and his HCTZ was reduced to 1/2 tab daily (12.522m This dizziness perhaps a bit different with more of an off balance or motion sensation then overtly lightheaded. He does NOT feel like he is going to faint   He reports 100% compliance with his Eliquis, no missed doses, he chronically has headaches, none unusual or particularly bad/worse then ususal  2. Sunday he woke in the middle of the night as usual for the bathroom, sat on the edge of the bed and felt like he was having a hard timie focusing on the clock numbers (lighted digital) and took a while to clear up.  He went to the bathroom and back to bed without difficulty, though felt a little like he was unbalanced or "off" and perhaps a little heavy in his belly without overt nauseous once back to bed. He did not feel like he was stumblig or having gait difficulty  3. He has notyced of late when seated in his recliner he is napping more then usual, but does not feel overtly fatigued in any way otherwise  4. He has been using psyllium for his constipation  and dizziness is mentioned so he has reduced it to QOD with some improvement in his symptoms.  5. Yesterday AM his BP unusually elevated       171/100,99bpm      156/100, 105bpm (after meds)       Afternoon 131/76, 76bpm and feeling better       He resumed his full tab of HCTZ     TODAY 149/88, 74bpm                   1156/79, 84bpm (after medicines)   AF history: Initial diagnosis 1982 Amiodarone +/- 7 years (also with h/o RVOT VT) stopped June 2018 >> permanent AF    Past Medical History:  Diagnosis Date  . Anticoagulated on Coumadin    follow[facility] in [facility] at [facility]  . Aortic stenosis    a. Echo 2/15: Severe LVH, EF 55-60%, Gr 1 DD,  mild to mod AS (mean 20 mmHg, peak 31 mmHg), AVA 1.4-1.5 cm2, mild AI, MAC, trivial MR, LA upper limits of normal, RVSP 22 mmHg, mild RAE;   b. Echo 2/16:  EF 55-60%, no RWMA, mild AS (mean 17 mmHg, peak 27 mmHg), AVA 1.5 cm2, mild AI, MAC, mild LAE  . Arthritis   . Bladder neoplasm   . Chronic cough    NO CARDIAC OR PULMONARY RELATED  . Coronary artery disease CARDIOLOGIST-  DR KLEIN/ ALL[facility]   a. s/p CABG 2004, b. LHC with patent grafts 07/2005, ejection fraction 50%;  c. Myoview 2/15: no ischemia, EF 61%  . Dry eyes   . Dyslipidemia   . GERD (gastroesophageal reflux disease)   . HTN (hypertension)   . Mild aortic stenosis   . PAF (paroxysmal atrial fibrillation) ([place])    CHADS2-VASc:  4  . Paroxysmal atrial fibrillation (HCC)   . 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 Amiodarone Rx  . S/P CABG x 5 2004    Past Surgical History:  Procedure Laterality Date  . CARDIAC CATHETERIZATION  07-26-2005  DR GAMBLE   PRESERVED LVF/  EF 50%/ PATENT GRAFTS  . CARDIAC CATHETERIZATION  03-04-2003  DR GAMBLE   SEVERE 3 VESSEL DISEASE  . [place]   PAF/ FALSE POSITIVE STRESS TEST  . CATARACT EXTRACTION W/ INTRAOCULAR LENS IMPLANT Right   . CORONARY ARTERY BYPASS GRAFT  03-08-2003  DR [id number]   LIMA TO LAD/ SVG TO OM2/  SVG TO DIAGONAL / SVG TO PDA & PLA  . CYSTOSCOPY WITH BIOPSY N/A 12/25/2012   Procedure: CYSTOSCOPY WITH BLADDER BIOPSY;  Surgeon: Fredricka Bonine, MD;  Location: [facility];  Service: Urology;  Laterality: N/A;  . ELECTROPHYSIOLOGY STUDY  07-27-2005  DR Carleene Overlie TAYLOR   MAPPING --   RESULT NONINDUCIBLE VT OR SVT/  DX RIGHT VENTRICULAR OUTFLOW TRACT VT AND RULES OUT MORE MALIGNANT CAUSES OF VT  . INGUINAL HERNIA REPAIR Bilateral [place]  . KNEE ARTHROSCOPY Right 1994  . LUMBAR DISC SURGERY  1999   L4 -- L5  . MOHS SURGERY     by Dr. Levada Dy 2019  . REMOVAL VOCAL CORD POLYPS  1978   . TONSILLECTOMY  AS CHILD  . TRANSTHORACIC ECHOCARDIOGRAM  08-08-2011   MILD LVH/  EF 47-82%/  GRADE I DIASTOLIC DYSFUNCTION/ MILD AV STENOSIS    Current Outpatient Medications  Medication Sig Dispense Refill  . acetaminophen (TYLENOL) 325 MG tablet Take 650 mg  by mouth once as needed for moderate pain or headache.    Marland Kitchen apixaban (ELIQUIS) 5 MG TABS tablet Take 1 tablet (5 mg total) by mouth 2 (two) times daily. 180 tablet 3  . butalbital-acetaminophen-caffeine (FIORICET, ESGIC) 50-325-40 MG per tablet Take 1 tablet by mouth once as needed for headache.     . cholecalciferol (VITAMIN D) 1000 UNITS tablet Take 1,000 Units by mouth daily.    Marland Kitchen diltiazem (CARDIZEM CD) 120 MG 24 hr capsule Take 1 capsule (120 mg) by mouth once daily    . fluticasone (FLONASE) 50 MCG/ACT nasal spray Place 1 spray into both nostrils daily. 16 g 1  . hydrochlorothiazide (HYDRODIURIL) 25 MG tablet TAKE 0.5-1 TABLET BY MOUTH EACH MORNING 90 tablet 3  . loratadine (CLARITIN) 10 MG tablet Take 10 mg by mouth daily.    . Melatonin 3 MG TABS Take by mouth at bedtime.    Vladimir Faster Glycol-Propyl Glycol (SYSTANE ULTRA OP) Apply 1-2 drops to eye once as needed (dry eyes.).     Marland Kitchen potassium chloride (K-DUR) 10 MEQ tablet Take 10 mEq by mouth every morning. Take as directed    . pravastatin (PRAVACHOL) 80 MG tablet Take 0.5 tablets (40 mg total) by mouth daily.    . predniSONE (DELTASONE) 10 MG tablet Take 1 tablet (10 mg total) by mouth daily with breakfast. 10 tablet 0   No current facility-administered medications for this visit.    Allergies:   Ace inhibitors, Doxycycline, Gabapentin, Hycodan [hydrocodone-homatropine], Oxycodone, Zocor [simvastatin], and Cephalexin   Social History:  The patient  reports that he quit smoking about 39 years ago. His smoking use included cigarettes. He has a 25.00 pack-year smoking history. He has never used smokeless tobacco. He reports that he does not drink alcohol and does not use  drugs.   Family History:  The patient's family history includes Cancer in his mother.  ROS:  Please see the history of present illness.    All other systems are reviewed and otherwise negative.   PHYSICAL EXAM:  VS:  There were no vitals taken for this visit. BMI: There is no height or weight on file to calculate BMI. Well nourished, well developed, in no acute distress  HEENT: normocephalic, atraumatic  Neck: no JVD, carotid bruits or masses Cardiac:  irreg-irreg, soft SM, no rubs, or gallops Lungs:  CTA b/l, no wheezing, rhonchi or rales  Abd: soft, nontender, MS: no deformity, age appropriate atrophy Ext: no edema, palp pedal pulses b/l Skin: warm and dry, no rash Neuro:  No gross focal deficits are appreciated Psych: euthymic mood, full affect     EKG:  Done today and reviewed by myself  AFib 88bpm, no changes from prior   09/25/2019: TTE IMPRESSIONS  1. Left ventricular ejection fraction, by estimation, is 60 to 65%. The  left ventricle has normal function. The left ventricle has no regional  wall motion abnormalities. There is mild concentric left ventricular  hypertrophy. Left ventricular diastolic  parameters are indeterminate.  2. Right ventricular systolic function is moderately reduced. The right  ventricular size is mildly enlarged. There is normal pulmonary artery  systolic pressure. The estimated right ventricular systolic pressure is  14.7 mmHg.  3. The mitral valve is normal in structure. Mild mitral valve  regurgitation. No evidence of mitral stenosis.  4. The aortic valve is tricuspid. Aortic valve regurgitation is mild to  moderate. Moderate aortic valve stenosis. Aortic valve area, by VTI  measures 1.19 cm. Aortic valve  mean gradient measures 23.0 mmHg. Aortic  valve Vmax measures 2.99 m/s.  5. Aortic dilatation noted. There is mild dilatation of the ascending  aorta measuring 43 mm.  6. The inferior vena cava is normal in size with greater than  50%  respiratory variability, suggesting right atrial pressure of 3 mmHg.  7. Compared to study of 10/19/2016, the mean AVG has increased from 50mHg  to 218mg, DI decreased from 0.29 to 0.25, AVA has remained about the  same.    10/19/2016: TTE Study Conclusions  - Left ventricle: Wall thickness was increased in a pattern of mild  LVH. Systolic function was normal. The estimated ejection  fraction was in the range of 55% to 60%. Left ventricular  diastolic function parameters were normal.  - Aortic valve: MIld to moderate AS gradients stable since October  2017 There was mild to moderate stenosis. There was mild  regurgitation. Valve area (VTI): 1.02 cm^2. Valve area (Vmax):  0.98 cm^2. Valve area (Vmean): 0.89 cm^2.  - Mitral valve: Calcified annulus.  - Atrial septum: No defect or patent foramen ovale was identified.    2/5/20915: stress myoview Impression Exercise Capacity:  Fair exercise capacity. BP Response:  Normal blood pressure response. Clinical Symptoms:  No chest pain or dyspnea. ECG Impression:  No significant ST segment change suggestive of ischemia. Comparison with Prior Nuclear Study: No images to compare  Overall Impression:  Normal stress nuclear study.  LV Ejection Fraction: 61%.  LV Wall Motion:  NL LV Function; NL Wall Motion   Recent Labs: 09/02/2019: Hemoglobin 14.2; Platelets 223 12/10/2019: ALT 11; BUN 16; Creatinine, Ser 0.97; Potassium 4.0; Sodium 140; TSH 3.14  12/10/2019: Cholesterol 167; HDL 54.00; LDL Cholesterol 92; Total CHOL/HDL Ratio 3; Triglycerides 104.0; VLDL 20.8   CrCl cannot be calculated (Patient's most recent lab result is older than the maximum 21 days allowed.).   Wt Readings from Last 3 Encounters:  12/17/19 192 lb 7 oz (87.3 kg)  09/11/19 198 lb (89.8 kg)  09/02/19 202 lb (91.6 kg)     Other studies reviewed: Additional studies/records reviewed today include: summarized above  ASSESSMENT AND PLAN:   1.  Permanent AFib     CHA2DS2Vasc is at least 4, on Eliquis, appropriately dosed     No palpitations or cardiac awareness, rate controlled   2. CAD (CABG)     stable without symptoms   3. HTN     U/down at home of late     continue to follow at home  4. VHD, mild-mod AS       5. Syncope     Post mictiration     Not recurrent  6. Dizziness He does not describe and denies feeling lightheaded or weak, he does not feel like he is going to faint, he reports more of a motion/moving sensation of dizziness No clear gait instabilities but feeling "off" or when first getting up maybe a step or two that feels unsteady He denies any focal weakness or observation ?difficulty focusing once or twice  Sounds more of a vertigo type symptoms then HR, rhythm, BP. No obvious focal neurological findings Discussed with DOD (Dr. CaCurt BearsTry meclizine and to follow up with his PMD.  If the meclizine helps will defer further refills to prmary care. He is asked to reach out to his PMD office again today to be seen for any other w/u or management he may feel indicated.  I will have him back in a few weeks to follow  up on his BP as well. Note that his cuff here today is close enough o be felt accurate   Disposition:  As above     Current medicines are reviewed at length with the patient today.   [name] Lasso, PA-C 02/10/2020 3:58 PM     [address] [place] [address] [phone] (office)  [phone] (fax)

## 2020-02-10 NOTE — Telephone Encounter (Signed)
STAT if patient feels like he/she is going to faint   1) Are you dizzy now? yes  2) Do you feel faint or have you passed out? no  3) Do you have any other symptoms? Jittery, shaky, flushed face  4) Have you checked your HR and BP (record if available)? 171/100 HR 99   Patient states he has been feeling dizzy and jittery. He states when he gets up at night he has double vision.

## 2020-02-10 NOTE — Telephone Encounter (Signed)
Noted. Agree with [facility] dispo.

## 2020-02-10 NOTE — Telephone Encounter (Signed)
Spoke with pt who has history of PAF.States he is AFIB and having some dizziness.  Pt states he feels better today then he did this weekend but still some mild dizziness and difficulty focusing on his clock radio.  Pt reports he is taking Eliquis as prescribed.  Pt also reports his B/P is 170/100.  Pt states his PCP decreased his HCTZ to 12.5mg  about 1 month ago.  Pt advised to contact his PCP re: elevated B/P.  Pt denies current CP, SOB or edema.  A-FIB clinic has no open appointments today.  Pt scheduled to see Tommye Standard, PA-C 02/11/2020 at 845pm.  Reviewed ED precautions.  Pt verbalizes understanding and agrees with current plan.

## 2020-02-10 NOTE — Telephone Encounter (Signed)
Pt had spoken with access nurse and pt was advised by Patty with access nurse to call [facility] for appt. Pt said dizziness, double vision started on 02/09/20; dizziness and double ans blurred vision worsened over night. Pt feels very jittery. No CP,SOB and no H/A. Room does not spin around; pt is just lightheaded; pt took BP on 02/09/20 but does not remember what the reading was. Pt took BP now and BP 171/100 P 99. Pt has not taken HCTZ 25 mg and diltiazem 120 mg today. Pt said BP is a lot higher than normal. Pt said he stays in afib and has not seen Dr Caryl Comes in about 1 year and pt wants to know if should schedule appt with Dr Caryl Comes. I advised I would go to [facility] now on [address] and then schedule FU for afib appt with Dr Caryl Comes. Pt voiced understanding and pts wife will take him to [facility] on [address] now. FYI to [name] who is out of office and DR G who is in office.

## 2020-02-11 ENCOUNTER — Encounter: Payer: Self-pay | Admitting: Physician Assistant

## 2020-02-11 ENCOUNTER — Ambulatory Visit (INDEPENDENT_AMBULATORY_CARE_PROVIDER_SITE_OTHER): Payer: Medicare Other | Admitting: Physician Assistant

## 2020-02-11 ENCOUNTER — Other Ambulatory Visit: Payer: Self-pay

## 2020-02-11 VITALS — BP 170/94 | HR 88 | Ht 70.0 in | Wt 193.0 lb

## 2020-02-11 DIAGNOSIS — R42 Dizziness and giddiness: Secondary | ICD-10-CM | POA: Diagnosis not present

## 2020-02-11 DIAGNOSIS — I4821 Permanent atrial fibrillation: Secondary | ICD-10-CM

## 2020-02-11 DIAGNOSIS — I2510 Atherosclerotic heart disease of native coronary artery without angina pectoris: Secondary | ICD-10-CM | POA: Diagnosis not present

## 2020-02-11 DIAGNOSIS — I10 Essential (primary) hypertension: Secondary | ICD-10-CM

## 2020-02-11 MED ORDER — MECLIZINE HCL 25 MG PO TABS: 25.0000 mg | ORAL_TABLET | Freq: Two times a day (BID) | ORAL | 1 refills | Status: DC | PRN

## 2020-02-11 NOTE — Patient Instructions (Addendum)
Medication Instructions:    START TAKING : MECLIZINE 25 MG AS NEEDED FOR DIZZINESS    *If you need a refill on your cardiac medications before your next appointment, please call your pharmacy*   Lab Work: NONE ORDERED  TODAY   If you have labs (blood work) drawn today and your tests are completely normal, you will receive your results only by: Marland Kitchen MyChart Message (if you have MyChart) OR . A paper copy in the mail If you have any lab test that is abnormal or we need to change your treatment, we will call you to review the results.   Testing/Procedures: NONE ORDERED  TODAY   Follow-Up: At [facility], you and your health needs are our priority.  As part of our continuing mission to provide you with exceptional heart care, we have created designated Provider Care Teams.  These Care Teams include your primary Cardiologist (physician) and Advanced Practice Providers (APPs -  Physician Assistants and Nurse Practitioners) who all work together to provide you with the care you need, when you need it.  We recommend signing up for the patient portal called "MyChart".  Sign up information is provided on this After Visit Summary.  MyChart is used to connect with patients for Virtual Visits (Telemedicine).  Patients are able to view lab/test results, encounter notes, upcoming appointments, etc.  Non-urgent messages can be sent to your provider as well.   To learn more about what you can do with MyChart, go to [email or web address].    Your next appointment:   3 week(s)  The format for your next appointment:   Virtual Visit   Provider:    You may see Tommye Standard, PA-C   FOLLOW UP WITH PRIMARY CARE PROVIDER AS SOON AS POSSIBLE   Other Instructions

## 2020-02-13 ENCOUNTER — Telehealth: Payer: Self-pay

## 2020-02-13 NOTE — Telephone Encounter (Signed)
Pt called Access nurse this after noon at 1137am reporting dizzy, jittery. Doesn't feel well. Caller wanted covid test. Caller stated he is dizzy and jittery and he doesn't feel well. States he wants a covid test. Symptoms started 4 days ago. Caller ended call before triage could be completed. Call was performed by RN, Lorra Hals. Note was never uploaded to Access Nurse portal so could not be copied to this note.  Contacted pt who reports he is just wanting covid tested so he contacted the [place] in [place] and he has an apt tomorrow. He reports his symptoms are mild and he does not need anything further. Tried to triage but pt said he just needed a covid test. Advised if any symptoms worsened or if he needed anything else to contact office. Gave ER precautions. Pt verbalized understanding.

## 2020-02-13 NOTE — Addendum Note (Signed)
Addended by: Claude Manges on: 02/13/2020 07:39 AM   Modules accepted: Orders

## 2020-02-14 NOTE — Telephone Encounter (Signed)
Noted. Thanks. I will [facility] to patient and [facility].

## 2020-02-17 ENCOUNTER — Encounter: Payer: Self-pay | Admitting: Family Medicine

## 2020-02-17 ENCOUNTER — Ambulatory Visit (INDEPENDENT_AMBULATORY_CARE_PROVIDER_SITE_OTHER): Payer: Medicare Other | Admitting: Family Medicine

## 2020-02-17 ENCOUNTER — Other Ambulatory Visit: Payer: Self-pay

## 2020-02-17 VITALS — BP 144/86 | HR 91 | Temp 97.1°F | Ht 70.0 in | Wt 192.1 lb

## 2020-02-17 DIAGNOSIS — Z23 Encounter for immunization: Secondary | ICD-10-CM | POA: Diagnosis not present

## 2020-02-17 DIAGNOSIS — I2510 Atherosclerotic heart disease of native coronary artery without angina pectoris: Secondary | ICD-10-CM

## 2020-02-17 DIAGNOSIS — R42 Dizziness and giddiness: Secondary | ICD-10-CM | POA: Diagnosis not present

## 2020-02-17 MED ORDER — HYDROCHLOROTHIAZIDE 25 MG PO TABS: ORAL_TABLET | ORAL | Status: DC

## 2020-02-17 NOTE — Progress Notes (Signed)
This visit occurred during the SARS-CoV-2 public health emergency.  Safety protocols were in place, including screening questions prior to the visit, additional usage of staff PPE, and extensive cleaning of exam room while observing appropriate contact time as indicated for disinfecting solutions.  He had been trying HCTZ 1/2 vs 1 tab a day.  He increased back to 1 tab a day over the last week.  BP improved in the meantime.  He clearly feels better in the meantime.    Prev dx vertigo.  Previous notes from cardiology reviewed and discussed with patient.  Recent events discussed.  He had been feeling dizzy especially when he was bent over and stooped over.  We talked about his episode when he was doing a lot of yard work he would feel a bit off balance.  He did not feel presyncopal.  He had an episode where he was not balanced when he was getting up out of bed recently.  His recent blood pressure elevation improved with increase hydrochlorothiazide back to 1 full tab a day.  He clearly feels better in the meantime.  Meds, vitals, and allergies reviewed.   ROS: Per HPI unless specifically indicated in ROS section   GEN: nad, alert and oriented HEENT: ncat, no vertigo symptoms with head turning. NECK: supple w/o LA CV:  IRR, not tachycardic. PULM: ctab, no inc wob ABD: soft, +bs EXT: no edema SKIN: no acute rash CN 2-12 wnl B, S/S wnl x4 and he is not lightheaded.

## 2020-02-17 NOTE — Patient Instructions (Signed)
I would continue your meds as is.  If you have more trouble then let me know.  I would defer imaging at this point.  Take care.  Glad to see you.

## 2020-02-19 NOTE — Assessment & Plan Note (Signed)
Blood pressure has been controlled on higher dose of hydrochlorothiazide.  He does not feel presyncopal.  Symptoms are improved.  Okay for outpatient follow-up at this point.  I would defer imaging because he is feeling better and he does not have any residual symptoms at this point.  We can consider imaging if he has recurrent episodes in the future.  He will update me as needed.  He agrees with plan.

## 2020-02-25 ENCOUNTER — Encounter: Payer: Self-pay | Admitting: Family Medicine

## 2020-02-26 ENCOUNTER — Other Ambulatory Visit: Payer: Self-pay | Admitting: Family Medicine

## 2020-02-26 DIAGNOSIS — R42 Dizziness and giddiness: Secondary | ICD-10-CM

## 2020-03-02 ENCOUNTER — Other Ambulatory Visit: Payer: Self-pay | Admitting: Family Medicine

## 2020-03-02 DIAGNOSIS — R42 Dizziness and giddiness: Secondary | ICD-10-CM

## 2020-03-06 NOTE — Progress Notes (Signed)
Virtual Visit via Telephone Note   This visit type was conducted due to national recommendations for restrictions regarding the COVID-19 Pandemic (e.g. social distancing) in an effort to limit this patient's exposure and mitigate transmission in our community.  Due to his co-morbid illnesses, this patient is at least at moderate risk for complications without adequate follow up.  This format is felt to be most appropriate for this patient at this time.  The patient did not have access to video technology/had technical difficulties with video requiring transitioning to audio format only (telephone).  All issues noted in this document were discussed and addressed.  No physical exam could be performed with this format.  Please refer to the patient's chart for his  consent to telehealth for [facility].    Date:  03/06/2020   ID:  [name] [name], DOB 06/25/1934, MRN [id number] The patient was identified using 2 identifiers.  Patient Location: Home Provider Location: Office/Clinic  PCP:  Tonia Ghent, MD  Cardiologist:  Virl Axe, MD     Evaluation Performed:  Follow-Up Visit  Chief Complaint:   planned follow up  History of Present Illness:    [name] [name] is a 85 y.o. male with CAD (remote CABG), mild AS, VT (RVOT tx with amiodarone), PAFib, syncope (pust micturition), LBBB.  He last saw Dr. Caryl Comes April 2019, he was doing well, no symptoms, no syncope.  No changes made.  Most recently saw A. Lynnell Jude, NP for pre-colonoscopy clearance. Doing well.  I saw him may 2021 He feels well.  No exertional intolerances, no formal exercise but gets out I the yard and so on.  NO CP, palpitations, no SOB, DOE.  No symptoms of PND or orthopnea.  NO dizzy spells, near syncope or syncope. No bleeding or signs of bleeding. Labs were updated and stable for his Eliquis, appropriately dosed Echo was updated w/LVEF 60-65%, mild conc LVH Mod reduced RV function (noted in 2017 as well),  normal pulm pressures, mild MR, mild AI, mod AS (mead gradient 23) Ascending Ao mildly dilated 65m   I saw him 02/11/2020 Patient called [place] his PMD office yesterday with dizziness, perhaps double vision/blurred vision and reports of 170/100 (prior to his HCTZ and dilt) it seems.  [facility], he was also given an appt to be seen here by our office. He reported compliance with his eliquis.  He mentions a number of symptoms/observations:  1. For a couple of week he has had some dizziness. This is very particularly when standing from a bent over or stooped position especially. He gives an example that a few days ago, last week was doing quite a lot of yard work, cutting back bushes and cleaning out debris from under them, a lot of raking and clearing.  When he would stand up from being under the bushes and clearing things he would feel a bit dizzy or off balance for a few moments. He does say that for years, when he first gets up in the morning or after seated for a long time he will sit up for a 30 seconds or so with h/u of getting a lilttle lightheaded with standing. August he mentioned this awas a little more then usual yo his PMD and his HCTZ was reduced to 1/2 tab daily (12.538m This dizziness perhaps a bit different with more of an off balance or motion sensation then overtly lightheaded. He does NOT  feel like he is going to faint   He reports 100% compliance with his Eliquis, no missed doses, he chronically has headaches, none unusual or particularly bad/worse then ususal  2. _0 56/79, 84bpm (after medicines)  I suspected more of a vertigo sounding constellation of symptoms and planned to try meclizine and advised him to follow up with his PMD for any further evaluation neuro w/u, imaging etc. His BP cuff was checked felt to be accurate.  I saw him informally a few weeks ago, he mentioned that his PMD did not think he needed neuro w/u unless recurrent but his [facility] MD seemed more worried and did order for him to wear a cardiac monitor.   TODAY He reports feeling ovr all improved. He has noticed that morning is when he notes that he feels a bit "off", or dizzy, perhaps a jittery feeling, or unsteady a few hours into his day he starts to feel better and remains well again until AM time. No near syncope or syncope, no falls. No CP, palpitations He completed the 2 week monitor for [facility] and returned it Friday last week. He tried 4-6 doses of the meclizine and did no think it made any difference. He has taken his BP and HR BID at home since his last visit ALL sound quite good, generally 120's-130's/80's-90 and HRs 60's-90's He has not had any re  current visual changes He sees ENT Dec 3rd  AF history: Initial diagnosis 1982 Amiodarone +/- 7 years (also with h/o RVOT VT) stopped June 2018 >> permanent AF   Past Medical History:  Diagnosis  Date  . Anticoagulated on Coumadin    [facility] in [facility] at [facility]  . Aortic stenosis    a. Echo 2/15: Severe LVH, EF 55-60%, Gr 1 DD, mild to mod AS (mean 20 mmHg, peak 31 mmHg), AVA 1.4-1.5 cm2, mild AI, MAC, trivial MR, LA upper limits of normal, RVSP 22 mmHg, mild RAE;   b. Echo 2/16:  EF 55-60%, no RWMA, mild AS (mean 17 mmHg, peak 27 mmHg), AVA 1.5 cm2, mild AI, MAC, mild LAE  . Arthritis   . Bladder neoplasm   . Chronic cough    NO  CARDIAC OR PULMONARY RELATED  . Coronary artery disease CARDIOLOGIST-  DR KLEIN/ [facility]   a. s/p CABG 2004, b. LHC with patent grafts 07/2005, ejection fraction 50%;  c. Myoview 2/15: no ischemia, EF 61%  . Dry eyes   . Dyslipidemia   . GERD (gastroesophageal reflux disease)   . HTN (hypertension)   . Mild aortic stenosis   . PAF (paroxysmal atrial fibrillation) ([place])    CHADS2-VASc:  4  . Paroxysmal atrial fibrillation (HCC)   . 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 Amiodarone Rx  . S/P CABG x 5 2004   Past Surgical History:  Procedure Laterality Date  . CARDIAC CATHETERIZATION  07-26-2005  DR GAMBLE   PRESERVED LVF/  EF 50%/ PATENT GRAFTS  . CARDIAC CATHETERIZATION  03-04-2003  DR GAMBLE   SEVERE 3 VESSEL DISEASE  . [place]   PAF/ FALSE POSITIVE STRESS TEST  . CATARACT EXTRACTION W/ INTRAOCULAR LENS IMPLANT Right   . CORONARY ARTERY BYPASS GRAFT  03-08-2003  DR [id number]   LIMA TO LAD/ SVG TO OM2/  SVG TO DIAGONAL / SVG TO PDA & PLA  . CYSTOSCOPY WITH BIOPSY N/A 12/25/2012   Procedure: CYSTOSCOPY WITH BLADDER BIOPSY;  Surgeon: Fredricka Bonine, MD;  Location: [facility];  Service: Urology;  Laterality: N/A;  . ELECTROPHYSIOLOGY STUDY  07-27-2005  DR Carleene Overlie TAYLOR   MAPPING --   RESULT NONINDUCIBLE VT OR SVT/  DX RIGHT VENTRICULAR OUTFLOW TRACT VT AND RULES OUT MORE MALIGNANT CAUSES OF VT  . INGUINAL HERNIA REPAIR Bilateral [facility]  .  KNEE ARTHROSCOPY Right 1994  . LUMBAR DISC SURGERY  1999   L4 -- L5  . MOHS SURGERY     by Dr. Levada Dy 2019  . REMOVAL VOCAL CORD POLYPS  1978  . TONSILLECTOMY  AS CHILD  . TRANSTHORACIC ECHOCARDIOGRAM  08-08-2011   MILD LVH/  EF 24-40%/  GRADE I DIASTOLIC DYSFUNCTION/ MILD AV STENOSIS     No outpatient medications have been marked as taking for the 03/09/20 encounter (Appointment) with Baldwin Jamaica, PA-C.     Allergies:   Ace inhibitors, Doxycycline, Gabapentin, Hycodan [hydrocodone-homatropine], Oxycodone, Zocor [simvastatin], and Cephalexin   Social History   Tobacco Use  . Smoking status: Former Smoker    Packs/day: 1.00    Years: 25.00    Pack years: 25.00    Types: Cigarettes    Quit date: 04/25/1980    Years since quitting: 39.8  . Smokeless tobacco: Never Used  Substance Use Topics  . Alcohol use: No    Alcohol/week: 0.0 standard drinks  . Drug use: No     Family Hx: The patient's family history includes Cancer in his mother.  ROS:   Please see the history of present illness.     All other systems reviewed and are negative.   Prior CV studies:   The following studies were reviewed today:  09/25/2019: TTE IMPRESSIONS  1. Left ventricular ejection fraction, by estimation, is 60 to 65%. The  left ventricle has normal function. The left ventricle has no regional  wall motion abnormalities. There is mild concentric left ventricular  hypertrophy. Left ventricular diastolic  parameters are indeterminate.  2. Right ventricular systolic function is moderately reduced. The right  ventricular size is mildly enlarged. There is normal pulmonary artery  systolic pressure. The estimated right ventricular systolic pressure is  10.2 mmHg.  3. The  mitral valve is normal in structure. Mild mitral valve  regurgitation. No evidence of mitral stenosis.  4. The aortic valve is tricuspid. Aortic valve regurgitation is mild to  moderate. Moderate aortic valve stenosis.  Aortic valve area, by VTI  measures 1.19 cm. Aortic valve mean gradient measures 23.0 mmHg. Aortic  valve Vmax measures 2.99 m/s.  5. Aortic dilatation noted. There is mild dilatation of the ascending  aorta measuring 43 mm.  6. The inferior vena cava is normal in size with greater than 50%  respiratory variability, suggesting right atrial pressure of 3 mmHg.  7. Compared to study of 10/19/2016, the mean AVG has increased from 10mHg  to 263mg, DI decreased from 0.29 to 0.25, AVA has remained about the  same.    10/19/2016: TTE Study Conclusions  - Left ventricle: Wall thickness was increased in a pattern of mild  LVH. Systolic function was normal. The estimated ejection  fraction was in the range of 55% to 60%. Left ventricular  diastolic function parameters were normal.  - Aortic valve: MIld to moderate AS gradients stable since October  2017 There was mild to moderate stenosis. There was mild  regurgitation. Valve area (VTI): 1.02 cm^2. Valve area (Vmax):  0.98 cm^2. Valve area (Vmean): 0.89 cm^2.  - Mitral valve: Calcified annulus.  - Atrial septum: No defect or patent foramen ovale was identified.    2/5/20915: stress myoview Impression Exercise Capacity: Fair exercise capacity. BP Response: Normal blood pressure response. Clinical Symptoms: No chest pain or dyspnea. ECG Impression: No significant ST segment change suggestive of ischemia. Comparison with Prior Nuclear Study: No images to compare  Overall Impression: Normal stress nuclear study.  LV Ejection Fraction: 61%. LV Wall Motion: NL LV Function; NL Wall Motion   Labs/Other Tests and Data Reviewed:       Recent Labs: 09/02/2019: Hemoglobin 14.2; Platelets 223 12/10/2019: ALT 11; BUN 16; Creatinine, Ser 0.97; Potassium 4.0; Sodium 140; TSH 3.14   Recent Lipid Panel Lab Results  Component Value Date/Time   CHOL 167 12/10/2019 08:56 AM   CHOL 158 12/20/2017 12:00 AM   TRIG 104.0  12/10/2019 08:56 AM   TRIG 95 12/20/2017 12:00 AM   HDL 54.00 12/10/2019 08:56 AM   CHOLHDL 3 12/10/2019 08:56 AM   LDLCALC 92 12/10/2019 08:56 AM   LDLCALC 74 12/20/2017 12:00 AM    Wt Readings from Last 3 Encounters:  02/17/20 192 lb 2 oz (87.1 kg)  02/11/20 193 lb (87.5 kg)  12/17/19 192 lb 7 oz (87.3 kg)     Risk Assessment/Calculations:     Objective:    Vital Signs:  133/85, 85bpm, 195lbs  He sounds good, speaks in full sentences at a normal pace, does not sound SOB, is quite pleasent  ASSESSMENT & PLAN:    1. Permanent AFib     CHA2DS2Vasc is at least 4, on Eliquis, appropriately dosed     No palpitations or cardiac awareness, rate controlled   2. CAD (CABG)     stable without symptoms   3. HTN     looks good  4. VHD     mild-mod AS by his echo June 2021       5. Syncope     Post mictiration     Not recurrent  6. Dizziness     Narrowed to morning tiomes, associated with normotensive BP reading and good HRs as well     He [facility] an will let [place]  once they have given him the result     He did make some diary entries with his AM symptoms    Symptoms seem less likely to be cardiac in etiology We wiil have him back in 33mo sooner if needed, pending his monitor findings    Time:   Today, I have spent 14 minutes with the patient with telehealth technology discussing the above problems.     Medication Adjustments/Labs and Tests Ordered: Current medicines are reviewed at length with the patient today.  Concerns regarding medicines are outlined above.   Tests Ordered: No orders of the defined types were placed in this encounter.   Medication Changes: No orders of the defined types were placed in this encounter.   Follow Up:  Virtual Visit  in 4 month(s)  Signed, RBaldwin Jamaica PA-C  03/06/2020 12:12 PM    [place]

## 2020-03-09 ENCOUNTER — Telehealth (INDEPENDENT_AMBULATORY_CARE_PROVIDER_SITE_OTHER): Payer: Medicare Other | Admitting: Physician Assistant

## 2020-03-09 ENCOUNTER — Other Ambulatory Visit: Payer: Self-pay

## 2020-03-09 VITALS — BP 133/85 | HR 85 | Ht 70.0 in | Wt 195.0 lb

## 2020-03-09 DIAGNOSIS — I2510 Atherosclerotic heart disease of native coronary artery without angina pectoris: Secondary | ICD-10-CM | POA: Diagnosis not present

## 2020-03-09 DIAGNOSIS — I4821 Permanent atrial fibrillation: Secondary | ICD-10-CM | POA: Diagnosis not present

## 2020-03-09 DIAGNOSIS — R42 Dizziness and giddiness: Secondary | ICD-10-CM | POA: Diagnosis not present

## 2020-03-10 ENCOUNTER — Other Ambulatory Visit: Payer: Self-pay

## 2020-03-10 ENCOUNTER — Observation Stay (HOSPITAL_COMMUNITY): Payer: Medicare Other

## 2020-03-10 ENCOUNTER — Encounter (HOSPITAL_COMMUNITY): Payer: Self-pay

## 2020-03-10 ENCOUNTER — Emergency Department (HOSPITAL_COMMUNITY): Payer: Medicare Other

## 2020-03-10 ENCOUNTER — Observation Stay (HOSPITAL_COMMUNITY)
Admission: EM | Admit: 2020-03-10 | Discharge: 2020-03-11 | Disposition: A | Discharge status: Home / Self Care | Payer: Medicare Other | Attending: Internal Medicine | Admitting: Internal Medicine

## 2020-03-10 DIAGNOSIS — Z8651 Personal history of combat and operational stress reaction: Secondary | ICD-10-CM | POA: Insufficient documentation

## 2020-03-10 DIAGNOSIS — I639 Cerebral infarction, unspecified: Secondary | ICD-10-CM

## 2020-03-10 DIAGNOSIS — Z20822 Contact with and (suspected) exposure to covid-19: Secondary | ICD-10-CM | POA: Insufficient documentation

## 2020-03-10 DIAGNOSIS — R9082 White matter disease, unspecified: Secondary | ICD-10-CM | POA: Diagnosis not present

## 2020-03-10 DIAGNOSIS — H43813 Vitreous degeneration, bilateral: Secondary | ICD-10-CM | POA: Diagnosis not present

## 2020-03-10 DIAGNOSIS — Z87891 Personal history of nicotine dependence: Secondary | ICD-10-CM | POA: Diagnosis not present

## 2020-03-10 DIAGNOSIS — H5462 Unqualified visual loss, left eye, normal vision right eye: Secondary | ICD-10-CM | POA: Diagnosis not present

## 2020-03-10 DIAGNOSIS — H35373 Puckering of macula, bilateral: Secondary | ICD-10-CM | POA: Diagnosis not present

## 2020-03-10 DIAGNOSIS — I2510 Atherosclerotic heart disease of native coronary artery without angina pectoris: Secondary | ICD-10-CM | POA: Diagnosis not present

## 2020-03-10 DIAGNOSIS — G9389 Other specified disorders of brain: Secondary | ICD-10-CM | POA: Diagnosis not present

## 2020-03-10 DIAGNOSIS — Z961 Presence of intraocular lens: Secondary | ICD-10-CM | POA: Diagnosis not present

## 2020-03-10 DIAGNOSIS — H34232 Retinal artery branch occlusion, left eye: Secondary | ICD-10-CM | POA: Diagnosis not present

## 2020-03-10 DIAGNOSIS — H0102A Squamous blepharitis right eye, upper and lower eyelids: Secondary | ICD-10-CM | POA: Diagnosis not present

## 2020-03-10 DIAGNOSIS — Z7901 Long term (current) use of anticoagulants: Secondary | ICD-10-CM | POA: Diagnosis not present

## 2020-03-10 DIAGNOSIS — I4891 Unspecified atrial fibrillation: Secondary | ICD-10-CM | POA: Diagnosis not present

## 2020-03-10 DIAGNOSIS — H349 Unspecified retinal vascular occlusion: Secondary | ICD-10-CM | POA: Diagnosis not present

## 2020-03-10 DIAGNOSIS — J3489 Other specified disorders of nose and nasal sinuses: Secondary | ICD-10-CM | POA: Diagnosis not present

## 2020-03-10 DIAGNOSIS — I6523 Occlusion and stenosis of bilateral carotid arteries: Secondary | ICD-10-CM | POA: Diagnosis not present

## 2020-03-10 DIAGNOSIS — R29818 Other symptoms and signs involving the nervous system: Secondary | ICD-10-CM | POA: Diagnosis not present

## 2020-03-10 DIAGNOSIS — Z8673 Personal history of transient ischemic attack (TIA), and cerebral infarction without residual deficits: Secondary | ICD-10-CM

## 2020-03-10 DIAGNOSIS — G319 Degenerative disease of nervous system, unspecified: Secondary | ICD-10-CM | POA: Diagnosis not present

## 2020-03-10 DIAGNOSIS — H34212 Partial retinal artery occlusion, left eye: Secondary | ICD-10-CM | POA: Diagnosis not present

## 2020-03-10 DIAGNOSIS — Z79899 Other long term (current) drug therapy: Secondary | ICD-10-CM | POA: Diagnosis not present

## 2020-03-10 DIAGNOSIS — Z85828 Personal history of other malignant neoplasm of skin: Secondary | ICD-10-CM | POA: Diagnosis not present

## 2020-03-10 DIAGNOSIS — I119 Hypertensive heart disease without heart failure: Secondary | ICD-10-CM | POA: Insufficient documentation

## 2020-03-10 DIAGNOSIS — H16223 Keratoconjunctivitis sicca, not specified as Sjogren's, bilateral: Secondary | ICD-10-CM | POA: Diagnosis not present

## 2020-03-10 DIAGNOSIS — H0102B Squamous blepharitis left eye, upper and lower eyelids: Secondary | ICD-10-CM | POA: Diagnosis not present

## 2020-03-10 DIAGNOSIS — I25810 Atherosclerosis of coronary artery bypass graft(s) without angina pectoris: Secondary | ICD-10-CM

## 2020-03-10 HISTORY — DX: Personal history of transient ischemic attack (TIA), and cerebral infarction without residual deficits: Z86.73

## 2020-03-10 HISTORY — DX: Cerebral infarction, unspecified: I63.9

## 2020-03-10 LAB — DIFFERENTIAL
Abs Immature Granulocytes: 0.01 10*3/uL (ref 0.00–0.07)
Basophils Absolute: 0 10*3/uL (ref 0.0–0.1)
Basophils Relative: 0 %
Eosinophils Absolute: 0.2 10*3/uL (ref 0.0–0.5)
Eosinophils Relative: 2 %
Immature Granulocytes: 0 %
Lymphocytes Relative: 27 %
Lymphs Abs: 2.2 10*3/uL (ref 0.7–4.0)
Monocytes Absolute: 0.7 10*3/uL (ref 0.1–1.0)
Monocytes Relative: 9 %
Neutro Abs: 5 10*3/uL (ref 1.7–7.7)
Neutrophils Relative %: 62 %

## 2020-03-10 LAB — COMPREHENSIVE METABOLIC PANEL
ALT: 15 U/L (ref 0–44)
AST: 28 U/L (ref 15–41)
Albumin: 3.9 g/dL (ref 3.5–5.0)
Alkaline Phosphatase: 65 U/L (ref 38–126)
Anion gap: 10 (ref 5–15)
BUN: 17 mg/dL (ref 8–23)
CO2: 28 mmol/L (ref 22–32)
Calcium: 9.3 mg/dL (ref 8.9–10.3)
Chloride: 101 mmol/L (ref 98–111)
Creatinine, Ser: 1.19 mg/dL (ref 0.61–1.24)
GFR, Estimated: >60 mL/min (ref >60)
Glucose, Bld: 125 mg/dL — ABNORMAL HIGH (ref 70–99)
Potassium: 3.8 mmol/L (ref 3.5–5.1)
Sodium: 139 mmol/L (ref 135–145)
Total Bilirubin: 0.5 mg/dL (ref 0.3–1.2)
Total Protein: 7 g/dL (ref 6.5–8.1)

## 2020-03-10 LAB — I-STAT CHEM 8, ED
BUN: 22 mg/dL (ref 8–23)
Calcium, Ion: 1.14 mmol/L — ABNORMAL LOW (ref 1.15–1.40)
Chloride: 101 mmol/L (ref 98–111)
Creatinine, Ser: 1.1 mg/dL (ref 0.61–1.24)
Glucose, Bld: 121 mg/dL — ABNORMAL HIGH (ref 70–99)
HCT: 42 % (ref 39.0–52.0)
Hemoglobin: 14.3 g/dL (ref 13.0–17.0)
Potassium: 3.8 mmol/L (ref 3.5–5.1)
Sodium: 141 mmol/L (ref 135–145)
TCO2: 29 mmol/L (ref 22–32)

## 2020-03-10 LAB — URINALYSIS, ROUTINE W REFLEX MICROSCOPIC
Bacteria, UA: NONE SEEN
Bilirubin Urine: NEGATIVE
Glucose, UA: NEGATIVE mg/dL
Ketones, ur: NEGATIVE mg/dL
Leukocytes,Ua: NEGATIVE
Nitrite: NEGATIVE
Protein, ur: NEGATIVE mg/dL
Specific Gravity, Urine: 1.02 (ref 1.005–1.030)
pH: 8 (ref 5.0–8.0)

## 2020-03-10 LAB — RAPID URINE DRUG SCREEN, HOSP PERFORMED
Amphetamines: NOT DETECTED
Barbiturates: POSITIVE — AB
Benzodiazepines: NOT DETECTED
Cocaine: NOT DETECTED
Opiates: NOT DETECTED
Tetrahydrocannabinol: NOT DETECTED

## 2020-03-10 LAB — CBC
HCT: 41.7 % (ref 39.0–52.0)
Hemoglobin: 13.8 g/dL (ref 13.0–17.0)
MCH: 32 pg (ref 26.0–34.0)
MCHC: 33.1 g/dL (ref 30.0–36.0)
MCV: 96.8 fL (ref 80.0–100.0)
Platelets: 206 10*3/uL (ref 150–400)
RBC: 4.31 MIL/uL (ref 4.22–5.81)
RDW: 13.3 % (ref 11.5–15.5)
WBC: 8.1 10*3/uL (ref 4.0–10.5)
nRBC: 0 % (ref 0.0–0.2)

## 2020-03-10 LAB — ETHANOL: Alcohol, Ethyl (B): <10 mg/dL (ref <10)

## 2020-03-10 MED ORDER — DILTIAZEM HCL ER COATED BEADS 120 MG PO CP24
120.0000 mg | ORAL_CAPSULE | Freq: Every day | ORAL | Status: DC
  Administered 2020-03-11: 120 mg via ORAL
  Filled 2020-03-10: qty 1

## 2020-03-10 MED ORDER — MELATONIN 3 MG PO TABS
3.0000 mg | ORAL_TABLET | Freq: Every day | ORAL | Status: DC
  Administered 2020-03-10: 3 mg via ORAL
  Filled 2020-03-10: qty 1

## 2020-03-10 MED ORDER — LABETALOL HCL 5 MG/ML IV SOLN: 10.0000 mg | INTRAVENOUS | Status: DC | PRN

## 2020-03-10 MED ORDER — STROKE: EARLY STAGES OF RECOVERY BOOK
Freq: Once | Status: AC
  Filled 2020-03-10: qty 1

## 2020-03-10 MED ORDER — IOHEXOL 350 MG/ML SOLN
75.0000 mL | Freq: Once | INTRAVENOUS | Status: AC | PRN
  Administered 2020-03-10: 75 mL via INTRAVENOUS

## 2020-03-10 MED ORDER — APIXABAN 5 MG PO TABS: 5.0000 mg | ORAL_TABLET | Freq: Two times a day (BID) | ORAL | Status: DC

## 2020-03-10 MED ORDER — ACETAMINOPHEN 160 MG/5ML PO SOLN: 650.0000 mg | ORAL | Status: DC | PRN

## 2020-03-10 MED ORDER — ASPIRIN 325 MG PO TABS
325.0000 mg | ORAL_TABLET | Freq: Every day | ORAL | Status: DC
  Administered 2020-03-10 – 2020-03-11 (×2): 325 mg via ORAL
  Filled 2020-03-10 (×2): qty 1

## 2020-03-10 MED ORDER — SENNOSIDES-DOCUSATE SODIUM 8.6-50 MG PO TABS: 1.0000 | ORAL_TABLET | Freq: Every evening | ORAL | Status: DC | PRN

## 2020-03-10 MED ORDER — PRAVASTATIN SODIUM 40 MG PO TABS
40.0000 mg | ORAL_TABLET | Freq: Every day | ORAL | Status: DC
  Administered 2020-03-10: 40 mg via ORAL
  Filled 2020-03-10: qty 1

## 2020-03-10 MED ORDER — LORAZEPAM 1 MG PO TABS
2.0000 mg | ORAL_TABLET | Freq: Once | ORAL | Status: AC
  Administered 2020-03-10: 2 mg via ORAL
  Filled 2020-03-10: qty 2

## 2020-03-10 MED ORDER — ACETAMINOPHEN 650 MG RE SUPP: 650.0000 mg | RECTAL | Status: DC | PRN

## 2020-03-10 MED ORDER — SODIUM CHLORIDE 0.9 % IV SOLN: INTRAVENOUS | Status: AC

## 2020-03-10 MED ORDER — ACETAMINOPHEN 325 MG PO TABS: 650.0000 mg | ORAL_TABLET | ORAL | Status: DC | PRN

## 2020-03-10 NOTE — ED Triage Notes (Signed)
Pt sent by eye doctor for further evaluation of left eye upper vision loss that started around noon today. 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  Pt also reports on Oct 18th he started having dizziness and jittery feeling.

## 2020-03-10 NOTE — ED Notes (Signed)
Pt transported to MRI 

## 2020-03-10 NOTE — Consult Note (Addendum)
Neurology Consultation  Reason for Consult: Altitudinal visual defect Referring Physician: Dr. Ron Parker, EDP  CC: Visual deficit   History is obtained from: Patient, chart  HPI: [name] [name] is a 85 y.o. male past medical history of paroxysmal atrial fibrillation on Eliquis, CAD, hypertension, hyperlipidemia, presenting to the emergency room upon referral from ophthalmology for an altitudinal visual defect. He reports that he was watching TV this late morning/early afternoon around 12 PM when he started noticing that he is having difficulty focusing on the top part of the screen.  When he shut his eye, he could not see the top half of things from his left eye.  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 Not a candidate for IV TPA due to being on Eliquis although still was within 24 hours of last known well at presentation. Reports an episode of dizziness sometime about a month ago which resolved on its own. No prior history of stroke.  Does not report palpitations with his paroxysmal atrial fibrillation. Has had atrial fibrillation since the 1980s.  Has been anticoagulated on Eliquis. Reports compliance to medications.  LKW: 12 PM on 03/10/2020 tpa given?: no, on Eliquis Premorbid modified Rankin scale (mRS): 0  ROS: Performed and negative except as noted in HPI. Past Medical History:  Diagnosis Date  . Anticoagulated on Coumadin    followed in CVRR at [facility]  . Aortic stenosis    a. Echo 2/15: Severe LVH, EF 55-60%, Gr 1 DD, mild to mod AS (mean 20 mmHg, peak 31 mmHg), AVA 1.4-1.5 cm2, mild AI, MAC, trivial MR, LA upper limits of normal, RVSP 22 mmHg, mild RAE;   b. Echo 2/16:  EF 55-60%, no RWMA, mild AS (mean 17 mmHg, peak 27 mmHg), AVA 1.5 cm2, mild AI, MAC, mild LAE  . Arthritis   . Bladder neoplasm   . Chronic cough    NO  CARDIAC OR PULMONARY RELATED  . Coronary artery disease CARDIOLOGIST-  DR KLEIN/ [facility]   a. s/p CABG 2004, b. LHC with patent grafts 07/2005, ejection fraction 50%;  c. Myoview 2/15: no ischemia, EF 61%  . Dry eyes   . Dyslipidemia   . GERD (gastroesophageal reflux disease)   . HTN (hypertension)   . Mild aortic stenosis   . PAF (paroxysmal atrial fibrillation) ([place])    CHADS2-VASc:  4  . Paroxysmal atrial fibrillation (HCC)   . 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 Amiodarone Rx  . S/P CABG x 5 2004    Family History  Problem Relation Age of Onset  . Cancer Mother     Social History:   reports that he quit smoking about 39 years ago. His smoking use included cigarettes. He has a 25.00 pack-year smoking history. He has never used smokeless tobacco. He reports that he does not drink alcohol and does not use drugs.  Medications  Current Facility-Administered Medications:  .   stroke: mapping our early stages of recovery book, , Does not apply, Once, Opyd, Timothy S, MD .  0.9 %  sodium chloride infusion, , Intravenous, Continuous, Opyd, Ilene Qua, MD .  acetaminophen (TYLENOL) tablet 650 mg, 650 mg, Oral, Q4H PRN **OR** acetaminophen (TYLENOL) 160 MG/5ML solution 650 mg, 650 mg, Per Tube, Q4H PRN **OR** acetaminophen (TYLENOL) suppository 650 mg, 650 mg, Rectal, Q4H PRN,  OpydIlene Qua, MD .  apixaban [facility]) tablet 5 mg, 5 mg, Oral, BID, Opyd, Ilene Qua, MD .  Derrill Memo ON 03/11/2020] diltiazem (CARDIZEM CD) 24 hr capsule 120 mg, 120 mg, Oral, Daily, Opyd, Timothy S, MD .  melatonin tablet 3 mg, 3 mg, Oral, QHS, Opyd, Timothy S, MD .  pravastatin (PRAVACHOL) tablet 40 mg, 40 mg, Oral, QPC supper, Opyd, Ilene Qua, MD .  senna-docusate (Senokot-S) tablet 1 tablet, 1 tablet, Oral, QHS PRN, Opyd, Ilene Qua, MD  Current Outpatient Medications:  .  acetaminophen (TYLENOL) 325 MG tablet, Take 650 mg by mouth once as needed for moderate pain or  headache., Disp: , Rfl:  .  apixaban (ELIQUIS) 5 MG TABS tablet, Take 1 tablet (5 mg total) by mouth 2 (two) times daily., Disp: 180 tablet, Rfl: 3 .  butalbital-acetaminophen-caffeine (FIORICET, ESGIC) 50-325-40 MG per tablet, Take 1 tablet by mouth once as needed for headache. , Disp: , Rfl:  .  cholecalciferol (VITAMIN D) 1000 UNITS tablet, Take 1,000 Units by mouth daily., Disp: , Rfl:  .  diltiazem (CARDIZEM CD) 120 MG 24 hr capsule, Take 1 capsule (120 mg) by mouth once daily, Disp: , Rfl:  .  fluticasone (FLONASE) 50 MCG/ACT nasal spray, Place 1 spray into both nostrils daily., Disp: 16 g, Rfl: 1 .  hydrochlorothiazide (HYDRODIURIL) 25 MG tablet, TAKE 1 TABLET BY MOUTH EACH MORNING, Disp: , Rfl:  .  loratadine (CLARITIN) 10 MG tablet, Take 10 mg by mouth daily., Disp: , Rfl:  .  meclizine (ANTIVERT) 25 MG tablet, Take 1 tablet (25 mg total) by mouth 2 (two) times daily as needed for dizziness., Disp: 60 tablet, Rfl: 1 .  Melatonin 3 MG TABS, Take by mouth at bedtime., Disp: , Rfl:  .  Polyethyl Glycol-Propyl Glycol (SYSTANE ULTRA OP), Apply 1-2 drops to eye once as needed (dry eyes.). , Disp: , Rfl:  .  potassium chloride (K-DUR) 10 MEQ tablet, Take 10 mEq by mouth every morning. Take as directed, Disp: , Rfl:  .  pravastatin (PRAVACHOL) 80 MG tablet, Take 0.5 tablets (40 mg total) by mouth daily., Disp: , Rfl:   Exam: Current vital signs: BP (!) 179/99   Pulse 83   Temp 99 F (37.2 C) (Oral)   Resp 16   SpO2 97%  Vital signs in last 24 hours: Temp:  [99 F (37.2 C)] 99 F (37.2 C) (11/16 1728) Pulse Rate:  [80-92] 83 (11/16 2100) Resp:  [15-20] 16 (11/16 2100) BP: (164-194)/(89-114) 179/99 (11/16 2100) SpO2:  [94 %-100 %] 97 % (11/16 2100) General: Awake alert in no distress HEENT: Normocephalic, atraumatic, dry mucous membranes, no lymphadenopathy, no neck stiffness. Cardiovascular regular rate rhythm with intermittent irregularly irregular rate.  Cardiac monitor also showing  intermittent A. fib. Respiratory: Clear to auscultation Extremities warm well perfused Neurological exam Awake alert oriented x3 Speech is nondysarthric No evidence of aphasia Cranial nerves: Left pupil is dilated and unresponsive-likely from the dilated eye exam earlier this evening, right pupil is 2 mm and briskly reactive to light, visual field examination reveals left monocular superior altitudinal field deficit, no restriction of extraocular movements, facial sensation intact, facial symmetry intact, auditory acuity mildly reduced bilaterally, shoulder shrug intact, tongue and palate midline. Motor exam: No drift in any of the 4 extremities Sensory exam: Intact light touch without extinction Coordination: No dysmetria NIH stroke scale: 1 for visual.  Labs I have reviewed labs in epic and the results pertinent to this consultation are:  CBC    Component Value Date/Time   WBC 8.1 03/10/2020 1743   RBC 4.31 03/10/2020 1743   HGB 14.3 03/10/2020 1759   HGB 14.2 09/02/2019 0921   HGB 14.5 12/21/2015 0000   HCT 42.0 03/10/2020 1759   HCT 42.4 09/02/2019 0921   PLT 206 03/10/2020 1743   PLT 223 09/02/2019 0921   PLT 211 11/01/2012 0000   MCV 96.8 03/10/2020 1743   MCV 93 09/02/2019 0921   MCH 32.0 03/10/2020 1743   MCHC 33.1 03/10/2020 1743   RDW 13.3 03/10/2020 1743   RDW 12.9 09/02/2019 0921   LYMPHSABS 2.2 03/10/2020 1743   LYMPHSABS 1.9 08/22/2017 1627   MONOABS 0.7 03/10/2020 1743   EOSABS 0.2 03/10/2020 1743   EOSABS 0.1 08/22/2017 1627   BASOSABS 0.0 03/10/2020 1743   BASOSABS 0.0 08/22/2017 1627    CMP     Component Value Date/Time   NA 141 03/10/2020 1759   NA 140 09/02/2019 0921   K 3.8 03/10/2020 1759   K 3.9 12/20/2017 0000   CL 101 03/10/2020 1759   CO2 28 03/10/2020 1743   GLUCOSE 121 (H) 03/10/2020 1759   BUN 22 03/10/2020 1759   BUN 13 09/02/2019 0921   CREATININE 1.10 03/10/2020 1759   CREATININE 1.010 12/20/2017 0000   CREATININE 0.9  11/27/2013 0000   CALCIUM 9.3 03/10/2020 1743   PROT 7.0 03/10/2020 1743   ALBUMIN 3.9 03/10/2020 1743   AST 28 03/10/2020 1743   AST 19 12/20/2017 0000   ALT 15 03/10/2020 1743   ALT 18 12/20/2017 0000   ALKPHOS 65 03/10/2020 1743   ALKPHOS 64 11/27/2013 0000   BILITOT 0.5 03/10/2020 1743   GFRNONAA >60 03/10/2020 1743   GFRAA 83 09/02/2019 0921    Lipid Panel     Component Value Date/Time   CHOL 167 12/10/2019 0856   CHOL 158 12/20/2017 0000   TRIG 104.0 12/10/2019 0856   TRIG 95 12/20/2017 0000   HDL 54.00 12/10/2019 0856   CHOLHDL 3 12/10/2019 0856   VLDL 20.8 12/10/2019 0856   LDLCALC 92 12/10/2019 0856   LDLCALC 74 12/20/2017 0000     Imaging I have reviewed the images obtained:  CT-scan of the brain-no acute changes. Calcified plaque on the CTA at the left greater than right ICA origins without hemodynamically significant stenosis. No proximal intracranial vessel occlusion or significant stenosis.   Assessment: A very pleasant 85 year old gentleman with past medical history of atrial fibrillation on anticoagulation with Eliquis, CAD, hypertension, hyperlipidemia presenting with sudden onset of monocular superior altitudinal deficit in the left eye, with ophthalmological evidence of intravascular plaque on detailed eye exam by ophthalmology-likely Hollenhorst plaque versus embolus. Not a candidate for IV TPA due to being on Eliquis-for retinal artery occlusions, TPA has been used for up to 24 hours from last known well. 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  If the MRI of the brain is negative, Eliquis can be resumed prior to discharge-we will defer to stroke team for final set of recommendations after the work-up is completed. -Permissive hypertension for 48 to 72  hours -do not treat systolic blood pressure unless it is higher than 220 and then too treat only on a as needed basis.  Discussed my plan with Dr. Ron Parker and the patient.  Stroke team will follow with you. -- Amie Portland, MD [facility] Pager: [phone] If 7pm to 7am, please call on call as listed on AMION.

## 2020-03-10 NOTE — H&P (Signed)
History and Physical    [name] [name] HTX:[id number] DOB: 1934-06-15 DOA: 03/10/2020  PCP: Tonia Ghent, MD   Patient coming from: Home   Chief Complaint: Blurred vision in left eye   HPI: [name] [name] is a 85 y.o. male with medical history significant for CAD status post CABG, atrial fibrillation on Eliquis, and hypertension, now presenting to emergency department with blurred vision involving the upper fields of his left eye.  Patient reports developing blurred vision at around noon today, only involving the upper fields of his left eye, was seen in an ophthalmology clinic for this, and sent to the emergency department with concern for acute hemiretinal artery occlusion.  Patient reports that he was in his usual state leading up to this, denies any focal numbness or weakness, and denies missing any doses of his Eliquis.  ED Course: Upon arrival to the ED, patient is found to be afebrile, saturating well on room air, and hypertensive to 180/100.  EKG features atrial fibrillation.  Head CT is negative for acute intracranial abnormality and CTA head and neck is notable for calcified plaque at the ICA origins, left greater than right, but no large vessel occlusion or hemodynamically-significant stenosis.  ED physician discussed the case with on-call ophthalmology who recommended neurologic work-up.  Neurology was consulted by the ED physician and has recommended medical admission for ongoing evaluation and management.  Patient was given 2 mg oral Ativan in preparation for MRI brain.  COVID-19 screening test not yet resulted.  Review of Systems:  All other systems reviewed and apart from HPI, are negative.  Past Medical History:  Diagnosis Date  . Anticoagulated on Coumadin    followed in [facility] at [facility]  . Aortic stenosis    a. Echo 2/15: Severe LVH, EF 55-60%, Gr 1 DD, mild to mod AS (mean 20 mmHg, peak 31 mmHg), AVA 1.4-1.5 cm2, mild AI, MAC, trivial MR, LA upper limits  of normal, RVSP 22 mmHg, mild RAE;   b. Echo 2/16:  EF 55-60%, no RWMA, mild AS (mean 17 mmHg, peak 27 mmHg), AVA 1.5 cm2, mild AI, MAC, mild LAE  . Arthritis   . Bladder neoplasm   . Chronic cough    NO CARDIAC OR PULMONARY RELATED  . Coronary artery disease CARDIOLOGIST-  DR KLEIN/ ALLRED   a. s/p CABG 2004, b. LHC with patent grafts 07/2005, ejection fraction 50%;  c. Myoview 2/15: no ischemia, EF 61%  . Dry eyes   . Dyslipidemia   . GERD (gastroesophageal reflux disease)   . HTN (hypertension)   . Mild aortic stenosis   . PAF (paroxysmal atrial fibrillation) ([place])    CHADS2-VASc:  4  . Paroxysmal atrial fibrillation (H[facility])   . RVOT-VT (right ventricular outflow tract ventricular tachycardia) ([place])    a. Amiodarone Rx  . S/P CABG x 5 2004    Past Surgical History:  Procedure Laterality Date  . CARDIAC CATHETERIZATION  07-26-2005  DR GAMBLE   PRESERVED LVF/  EF 50%/ PATENT GRAFTS  . CARDIAC CATHETERIZATION  03-04-2003  DR GAMBLE   SEVERE 3 VESSEL DISEASE  . [place]   PAF/ FALSE POSITIVE STRESS TEST  . CATARACT EXTRACTION W/ INTRAOCULAR LENS IMPLANT Right   . CORONARY ARTERY BYPASS GRAFT  03-08-2003  DR VUYEBXID   LIMA TO LAD/ SVG TO OM2/  SVG TO DIAGONAL / SVG TO PDA & PLA  . CYSTOSCOPY WITH BIOPSY N/A 12/25/2012   Procedure: CYSTOSCOPY  WITH BLADDER BIOPSY;  Surgeon: Fredricka Bonine, MD;  Location: [facility];  Service: Urology;  Laterality: N/A;  . ELECTROPHYSIOLOGY STUDY  07-27-2005  DR Carleene Overlie TAYLOR   MAPPING --   RESULT NONINDUCIBLE VT OR SVT/  DX RIGHT VENTRICULAR OUTFLOW TRACT VT AND RULES OUT MORE MALIGNANT CAUSES OF VT  . INGUINAL HERNIA REPAIR Bilateral [employer] [place]  . KNEE ARTHROSCOPY Right 1994  . LUMBAR DISC SURGERY  1999   L4 -- L5  . MOHS SURGERY     by Dr. Levada Dy 2019  . REMOVAL VOCAL CORD POLYPS  1978  . TONSILLECTOMY  AS CHILD  . TRANSTHORACIC ECHOCARDIOGRAM  08-08-2011   MILD LVH/  EF 55-60%/   GRADE I DIASTOLIC DYSFUNCTION/ MILD AV STENOSIS    Social History:   reports that he quit smoking about 39 years ago. His smoking use included cigarettes. He has a 25.00 pack-year smoking history. He has never used smokeless tobacco. He reports that he does not drink alcohol and does not use drugs.  Allergies  Allergen Reactions  . Ace Inhibitors Other (See Comments)    COUGH  . Chocolate     Headaches   . Doxycycline     Nausea and abd pain  . Gabapentin     Dizziness  . Hycodan [Hydrocodone-Homatropine] Nausea And Vomiting  . Oxycodone Nausea Only    Pt states that he cannot take prescription pain medication  . Zocor [Simvastatin] Other (See Comments)    MYALGIA  . Cephalexin Rash    Family History  Problem Relation Age of Onset  . Cancer Mother      Prior to Admission medications   Medication Sig Start Date End Date Taking? Authorizing Provider  acetaminophen (TYLENOL) 325 MG tablet Take 650 mg by mouth once as needed for moderate pain or headache.    [provider]  apixaban (ELIQUIS) 5 MG TABS tablet Take 1 tablet (5 mg total) by mouth 2 (two) times daily. 06/02/16   Deboraha Sprang, MD  butalbital-acetaminophen-caffeine (FIORICET, ESGIC) [phone] MG per tablet Take 1 tablet by mouth once as needed for headache.     [provider]  cholecalciferol (VITAMIN D) 1000 UNITS tablet Take 1,000 Units by mouth daily.    [provider]  diltiazem (CARDIZEM CD) 120 MG 24 hr capsule Take 1 capsule (120 mg) by mouth once daily 10/28/16   Deboraha Sprang, MD  fluticasone [facility]) 50 MCG/ACT nasal spray Place 1 spray into both nostrils daily. 09/11/18   Tonia Ghent, MD  hydrochlorothiazide (HYDRODIURIL) 25 MG tablet TAKE 1 TABLET BY MOUTH EACH MORNING 02/17/20   Tonia Ghent, MD  loratadine (CLARITIN) 10 MG tablet Take 10 mg by mouth daily.    [provider]  meclizine (ANTIVERT) 25 MG tablet Take 1 tablet (25 mg total) by mouth 2 (two)  times daily as needed for dizziness. 02/11/20   Baldwin Jamaica, PA-C  Melatonin 3 MG TABS Take by mouth at bedtime.    [provider]  Polyethyl Glycol-Propyl Glycol (SYSTANE ULTRA OP) Apply 1-2 drops to eye once as needed (dry eyes.).     [provider]  potassium chloride (K-DUR) 10 MEQ tablet Take 10 mEq by mouth every morning. Take as directed 05/24/12   Deboraha Sprang, MD  pravastatin (PRAVACHOL) 80 MG tablet Take 0.5 tablets (40 mg total) by mouth daily. 04/12/19   Tonia Ghent, MD    Physical Exam: Vitals:  03/10/20 1945 03/10/20 2015 03/10/20 2030 03/10/20 2100  BP: (!) 164/100 (!) 167/91 (!) 177/102 (!) 179/99  Pulse: 83 87 80 83  Resp: _0 Temp:      TempSrc:      SpO2: 96% 94% 99% 97%    Constitutional: NAD, calm  Eyes: PERTLA, lids and conjunctivae normal ENMT: Mucous membranes are moist. Posterior pharynx clear of any exudate or lesions.   Neck: normal, supple, no masses, no thyromegaly Respiratory: no wheezing, no crackles. No accessory muscle use.  Cardiovascular: Rate ~80 and irregularly irregular. No extremity edema.   Abdomen: No distension, no tenderness, soft. Bowel sounds active.  Musculoskeletal: no clubbing / cyanosis. No joint deformity upper and lower extremities.   Skin: no significant rashes, lesions, ulcers. Warm, dry, well-perfused. Neurologic: Visual acuity deficit involving upper field left eye, gross hearing deficit, CN II-XII grossly intact otherwise. Sensation to light touch intact. Strength 5/5 in all 4 limbs.  Psychiatric: Alert and oriented to person, place, and situation. Very pleasant and cooperative.    Labs and Imaging on Admission: I have personally reviewed following labs and imaging studies  CBC: Recent Labs  Lab 03/10/20 1743 03/10/20 1759  WBC 8.1  --   NEUTROABS 5.0  --   HGB 13.8 14.3  HCT 41.7 42.0  MCV 96.8  --   PLT 206  --    Basic Metabolic Panel: Recent Labs  Lab 03/10/20 1743  03/10/20 1759  NA 139 141  K 3.8 3.8  CL 101 101  CO2 28  --   GLUCOSE 125* 121*  BUN 17 22  CREATININE 1.19 1.10  CALCIUM 9.3  --    GFR: Estimated Creatinine Clearance: 57 mL/min (by C-G formula based on SCr of 1.1 mg/dL). Liver Function Tests: Recent Labs  Lab 03/10/20 1743  AST 28  ALT 15  ALKPHOS 65  BILITOT 0.5  PROT 7.0  ALBUMIN 3.9   No results for input(s): LIPASE, AMYLASE in the last 168 hours. No results for input(s): AMMONIA in the last 168 hours. Coagulation Profile: No results for input(s): INR, PROTIME in the last 168 hours. Cardiac Enzymes: No results for input(s): CKTOTAL, CKMB, CKMBINDEX, TROPONINI in the last 168 hours. BNP (last 3 results) No results for input(s): PROBNP in the last 8760 hours. HbA1C: No results for input(s): HGBA1C in the last 72 hours. CBG: No results for input(s): GLUCAP in the last 168 hours. Lipid Profile: No results for input(s): CHOL, HDL, LDLCALC, TRIG, CHOLHDL, LDLDIRECT in the last 72 hours. Thyroid Function Tests: No results for input(s): TSH, T4TOTAL, FREET4, T3FREE, THYROIDAB in the last 72 hours. Anemia Panel: No results for input(s): VITAMINB12, FOLATE, FERRITIN, TIBC, IRON, RETICCTPCT in the last 72 hours. Urine analysis:    Component Value Date/Time   COLORURINE STRAW (A) 03/10/2020 1733   APPEARANCEUR CLEAR 03/10/2020 1733   LABSPEC 1.020 03/10/2020 1733   PHURINE 8.0 03/10/2020 1733   GLUCOSEU NEGATIVE 03/10/2020 1733   HGBUR SMALL (A) 03/10/2020 1733   BILIRUBINUR NEGATIVE 03/10/2020 1733   KETONESUR NEGATIVE 03/10/2020 1733   PROTEINUR NEGATIVE 03/10/2020 1733   NITRITE NEGATIVE 03/10/2020 1733   LEUKOCYTESUR NEGATIVE 03/10/2020 1733   Sepsis Labs: _1 (procalcitonin:4,lacticidven:4) )No results found for this or any previous visit (from the past 240 hour(s)).   Radiological Exams on Admission: CT Angio Head W or Wo Contrast  Result Date: 03/10/2020 CLINICAL DATA:  Left eye vision loss,  eye doctor examination reports acute hemiretinal artery occlusion EXAM: CT  ANGIOGRAPHY HEAD AND NECK TECHNIQUE: Multidetector CT imaging of the head and neck was performed using the standard protocol during bolus administration of intravenous contrast. Multiplanar CT image reconstructions and MIPs were obtained to evaluate the vascular anatomy. Carotid stenosis measurements (when applicable) are obtained utilizing NASCET criteria, using the distal internal carotid diameter as the denominator. CONTRAST:  51m OMNIPAQUE IOHEXOL 350 MG/ML SOLN COMPARISON:  None. FINDINGS: CT HEAD Brain: There is no acute intracranial hemorrhage, mass effect, or edema. Gray-white differentiation is preserved. There is no extra-axial fluid collection. Prominence of the ventricles and sulci reflects generalized parenchymal volume loss. Confluent areas of hypoattenuation in the supratentorial white matter are nonspecific but probably reflect moderate chronic microvascular ischemic changes. Vascular: No hyperdense vessel. Skull: Calvarium is unremarkable. Sinuses/Orbits: No acute finding. Other: None. Review of the MIP images confirms the above findings CTA NECK Aortic arch: Great vessel origins are patent with mild calcified plaque. Right carotid system: Common carotid is patent with mild calcified plaque. There is mild calcified plaque at the bifurcation and proximal internal carotid with minimal stenosis. Cervical internal carotid and external carotid arteries are patent Left carotid system: Common carotid is patent with mild calcified plaque. There is calcified plaque at the bifurcation and along the proximal internal carotid with mild less than 50% stenosis. Cervical internal carotid is patent with mild tortuosity proximally. External carotid is patent. Vertebral arteries: Extracranial vertebral arteries are patent with mild calcified plaque. No measurable stenosis. Skeleton: Degenerative changes of the included spine. Other neck: No  mass or adenopathy. Upper chest: No apical lung mass. Review of the MIP images confirms the above findings CTA HEAD Anterior circulation: Intracranial internal carotid arteries patent with mild calcified plaque. Anterior and middle cerebral arteries are patent. Posterior circulation: Intracranial vertebral arteries are patent. There is extradural origin of the left PICA. Basilar artery is patent. Posterior cerebral arteries are patent. Venous sinuses: Patent as allowed by contrast bolus timing. Review of the MIP images confirms the above findings IMPRESSION: No acute intracranial abnormality. Moderate chronic microvascular ischemic changes. Calcified plaque at the left greater than right ICA origins without hemodynamically significant stenosis. No proximal intracranial vessel occlusion or significant stenosis. Electronically Signed   By: PMacy MisM.D.   On: 03/10/2020 19:11   CT Angio Neck W and/or Wo Contrast  Result Date: 03/10/2020 CLINICAL DATA:  Left eye vision loss, eye doctor examination reports acute hemiretinal artery occlusion EXAM: CT ANGIOGRAPHY HEAD AND NECK TECHNIQUE: Multidetector CT imaging of the head and neck was performed using the standard protocol during bolus administration of intravenous contrast. Multiplanar CT image reconstructions and MIPs were obtained to evaluate the vascular anatomy. Carotid stenosis measurements (when applicable) are obtained utilizing NASCET criteria, using the distal internal carotid diameter as the denominator. CONTRAST:  746mOMNIPAQUE IOHEXOL 350 MG/ML SOLN COMPARISON:  None. FINDINGS: CT HEAD Brain: There is no acute intracranial hemorrhage, mass effect, or edema. Gray-white differentiation is preserved. There is no extra-axial fluid collection. Prominence of the ventricles and sulci reflects generalized parenchymal volume loss. Confluent areas of hypoattenuation in the supratentorial white matter are nonspecific but probably reflect moderate chronic  microvascular ischemic changes. Vascular: No hyperdense vessel. Skull: Calvarium is unremarkable. Sinuses/Orbits: No acute finding. Other: None. Review of the MIP images confirms the above findings CTA NECK Aortic arch: Great vessel origins are patent with mild calcified plaque. Right carotid system: Common carotid is patent with mild calcified plaque. There is mild calcified plaque at the bifurcation and proximal internal carotid with  minimal stenosis. Cervical internal carotid and external carotid arteries are patent Left carotid system: Common carotid is patent with mild calcified plaque. There is calcified plaque at the bifurcation and along the proximal internal carotid with mild less than 50% stenosis. Cervical internal carotid is patent with mild tortuosity proximally. External carotid is patent. Vertebral arteries: Extracranial vertebral arteries are patent with mild calcified plaque. No measurable stenosis. Skeleton: Degenerative changes of the included spine. Other neck: No mass or adenopathy. Upper chest: No apical lung mass. Review of the MIP images confirms the above findings CTA HEAD Anterior circulation: Intracranial internal carotid arteries patent with mild calcified plaque. Anterior and middle cerebral arteries are patent. Posterior circulation: Intracranial vertebral arteries are patent. There is extradural origin of the left PICA. Basilar artery is patent. Posterior cerebral arteries are patent. Venous sinuses: Patent as allowed by contrast bolus timing. Review of the MIP images confirms the above findings IMPRESSION: No acute intracranial abnormality. Moderate chronic microvascular ischemic changes. Calcified plaque at the left greater than right ICA origins without hemodynamically significant stenosis. No proximal intracranial vessel occlusion or significant stenosis. Electronically Signed   By: [name] M.D.   On: 03/10/2020 19:11    EKG: Independently reviewed. Atrial fibrillation.    Assessment/Plan   1. Acute hemiretinal artery occlusion  - Patient developed blurred vision involving the upper fields of left eye around noon today, saw ophthalmology who sent him to ED with concern for acute hemiretinal artery occlusion  - Vision has not improved, no other focal deficit identified  - CT head with no acute findings, CTA head & neck with no LVO or hemodynamically-significant stenosis but calcified plaque at ICA origins  - Neurology consulted by ED physician and much appreciated  - Check MRI brain, echocardiogram, A1c and lipid panel  - Hold Eliquis for now, start ASA 325, continue statin    2. Atrial fibrillation  - In rate-controlled a fib on admission  - CHADS-VASc is 74 (age x2, CAD, HTN, CVA x2)  - Hold Eliquis for now per neurlogy recommendations, continue diltiazem for rate-control   3. CAD - No anginal complaints, continue statin    4. Hypertension  - BP elevated on admission  - Appreciate neuro recommendations, plan to hold HCTZ and permit HTN in acute-phase, treat only as needed for SBP >220    DVT prophylaxis: Eliquis  Code Status: Full  Family Communication: Discussed with patient  Disposition Plan:  Patient is from: Home  Anticipated d/c is to: Home  Anticipated d/c date is: 03/11/20 Patient currently: Pending MRI, echo, CUS, A1c, lipid panel, therapy consultations  Consults called: Neurology consulted by ED physician  Admission status: Observation    Vianne Bulls, MD [facility]  03/10/2020, 9:21 PM

## 2020-03-10 NOTE — ED Notes (Signed)
Patient transported to CT 

## 2020-03-10 NOTE — ED Notes (Signed)
Pt back from CT

## 2020-03-10 NOTE — ED Provider Notes (Signed)
[facility] EMERGENCY DEPARTMENT Provider Note   CSN: [id number] Arrival date & time: 03/10/20  1716     History Chief Complaint  Patient presents with  . Loss of Vision    RAYMAN PETROSIAN is a 85 y.o. male.   Eye Problem Location:  Left eye Quality: vision change. Onset quality:  Sudden Duration:  6 hours Timing:  Constant Progression:  Unchanged Chronicity:  New Context comment:  Concern by ophtho for CRAO Relieved by:  Nothing Worsened by:  Nothing Ineffective treatments:  None tried Associated symptoms: blurred vision   Associated symptoms: no crusting, no discharge, no headaches, no inflammation, no itching, no nausea, no numbness, no photophobia, no redness, no vomiting and no weakness        Past Medical History:  Diagnosis Date  . Anticoagulated on Coumadin    followed in [facility] at [facility]  . Aortic stenosis    a. Echo 2/15: Severe LVH, EF 55-60%, Gr 1 DD, mild to mod AS (mean 20 mmHg, peak 31 mmHg), AVA 1.4-1.5 cm2, mild AI, MAC, trivial MR, LA upper limits of normal, RVSP 22 mmHg, mild RAE;   b. Echo 2/16:  EF 55-60%, no RWMA, mild AS (mean 17 mmHg, peak 27 mmHg), AVA 1.5 cm2, mild AI, MAC, mild LAE  . Arthritis   . Bladder neoplasm   . Chronic cough    NO CARDIAC OR PULMONARY RELATED  . Coronary artery disease CARDIOLOGIST-  DR KLEIN/ ALLRED   a. s/p CABG 2004, b. LHC with patent grafts 07/2005, ejection fraction 50%;  c. Myoview 2/15: no ischemia, EF 61%  . Dry eyes   . Dyslipidemia   . GERD (gastroesophageal reflux disease)   . HTN (hypertension)   . Mild aortic stenosis   . PAF (paroxysmal atrial fibrillation) ([place])    CHADS2-VASc:  4  . Paroxysmal atrial fibrillation (HCC)   . 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 Amiodarone Rx  . S/P CABG x 5 2004    Patient Active Problem List   Diagnosis Date Noted  . Constipation 12/22/2019  . Right foot pain 09/11/2019  . Leg pain  06/23/2019  . Muscle pain 04/04/2019  . Sciatica 12/28/2017  . Neuropathy 10/07/2017  . Foot swelling 10/07/2017  . Shoulder pain 03/29/2017  . CAD (coronary artery disease), autologous vein bypass graft 10/31/2016  . Family history of colon cancer 10/31/2016  . History of adenomatous polyp of colon 10/31/2016  . Osteoarthritis 10/31/2016  . Atrial fibrillation ([place]) 10/31/2016  . Syncope 02/11/2016  . High risk medication use   . Skin lesion 06/04/2015  . Lower back pain 05/01/2015  . Elevated TSH 12/26/2014  . Back pain 01/22/2014  . Aortic stenosis 05/16/2013  . Bladder neoplasm 11/23/2012  . BCC (basal cell carcinoma of skin) 08/29/2012  . Vertigo 08/24/2012  . GERD (gastroesophageal reflux disease) 05/01/2012  . Hyperlipidemia 03/14/2012  . TMJ pain dysfunction syndrome 01/20/2012  . Long term (current) use of anticoagulants 08/31/2011  . RVOT ventricular tachycardia ([place])   . Cough 04/29/2009  . HEARING LOSS, SUDDEN 12/03/2007  . Essential hypertension 02/22/2007  . PAF (paroxysmal atrial fibrillation) ([place]) 02/22/2007    Past Surgical History:  Procedure Laterality Date  . CARDIAC CATHETERIZATION  07-26-2005  DR GAMBLE   PRESERVED LVF/  EF 50%/ PATENT GRAFTS  . CARDIAC CATHETERIZATION  03-04-2003  DR GAMBLE   SEVERE 3 VESSEL DISEASE  . [place]  [facility]   PAF/ FALSE POSITIVE STRESS TEST  . CATARACT EXTRACTION W/ INTRAOCULAR LENS IMPLANT Right   . CORONARY ARTERY BYPASS GRAFT  03-08-2003  DR [id number]   LIMA TO LAD/ SVG TO OM2/  SVG TO DIAGONAL / SVG TO PDA & PLA  . CYSTOSCOPY WITH BIOPSY N/A 12/25/2012   Procedure: CYSTOSCOPY WITH BLADDER BIOPSY;  Surgeon: Fredricka Bonine, MD;  Location: [facility];  Service: Urology;  Laterality: N/A;  . ELECTROPHYSIOLOGY STUDY  07-27-2005  DR Carleene Overlie TAYLOR   MAPPING --   RESULT NONINDUCIBLE VT OR SVT/  DX RIGHT VENTRICULAR OUTFLOW TRACT VT AND RULES OUT MORE MALIGNANT CAUSES OF VT   . INGUINAL HERNIA REPAIR Bilateral [place]  . KNEE ARTHROSCOPY Right 1994  . LUMBAR DISC SURGERY  1999   L4 -- L5  . MOHS SURGERY     by Dr. Levada Dy 2019  . REMOVAL VOCAL CORD POLYPS  1978  . TONSILLECTOMY  AS CHILD  . TRANSTHORACIC ECHOCARDIOGRAM  08-08-2011   MILD LVH/  EF 56-38%/  GRADE I DIASTOLIC DYSFUNCTION/ MILD AV STENOSIS       Family History  Problem Relation Age of Onset  . Cancer Mother     Social History   Tobacco Use  . Smoking status: Former Smoker    Packs/day: 1.00    Years: 25.00    Pack years: 25.00    Types: Cigarettes    Quit date: 04/25/1980    Years since quitting: 39.9  . Smokeless tobacco: Never Used  Substance Use Topics  . Alcohol use: No    Alcohol/week: 0.0 standard drinks  . Drug use: No    Home Medications Prior to Admission medications   Medication Sig Start Date End Date Taking? Authorizing Provider  acetaminophen (TYLENOL) 325 MG tablet Take 650 mg by mouth once as needed for moderate pain or headache.    [provider]  apixaban (ELIQUIS) 5 MG TABS tablet Take 1 tablet (5 mg total) by mouth 2 (two) times daily. 06/02/16   Deboraha Sprang, MD  butalbital-acetaminophen-caffeine (FIORICET, ESGIC) [phone] MG per tablet Take 1 tablet by mouth once as needed for headache.     [provider]  cholecalciferol (VITAMIN D) 1000 UNITS tablet Take 1,000 Units by mouth daily.    [provider]  diltiazem (CARDIZEM CD) 120 MG 24 hr capsule Take 1 capsule (120 mg) by mouth once daily 10/28/16   Deboraha Sprang, MD  fluticasone [facility]) 50 MCG/ACT nasal spray Place 1 spray into both nostrils daily. 09/11/18   Tonia Ghent, MD  hydrochlorothiazide (HYDRODIURIL) 25 MG tablet TAKE 1 TABLET BY MOUTH EACH MORNING 02/17/20   Tonia Ghent, MD  loratadine (CLARITIN) 10 MG tablet Take 10 mg by mouth daily.    [provider]  meclizine (ANTIVERT) 25 MG tablet Take 1 tablet (25 mg total) by mouth 2 (two) times  daily as needed for dizziness. 02/11/20   Baldwin [place], PA-C  Melatonin 3 MG TABS Take by mouth at bedtime.    [provider]  Polyethyl Glycol-Propyl Glycol (SYSTANE ULTRA OP) Apply 1-2 drops to eye once as needed (dry eyes.).     [provider]  potassium chloride (K-DUR) 10 MEQ tablet Take 10 mEq by mouth every morning. Take as directed 05/24/12   Deboraha Sprang, MD  pravastatin (PRAVACHOL) 80 MG tablet Take 0.5 tablets (40 mg total) by mouth daily. 04/12/19   Elsie Stain  S, MD    Allergies    Ace inhibitors, Chocolate, Doxycycline, Gabapentin, Hycodan [hydrocodone-homatropine], Oxycodone, Zocor [simvastatin], and Cephalexin  Review of Systems   Review of Systems  Constitutional: Negative for chills and fever.  HENT: Negative for congestion and rhinorrhea.   Eyes: Positive for blurred vision and visual disturbance. Negative for photophobia, pain, discharge, redness and itching.  Respiratory: Negative for cough and shortness of breath.   Cardiovascular: Negative for chest pain and palpitations.  Gastrointestinal: Negative for diarrhea, nausea and vomiting.  Genitourinary: Negative for difficulty urinating and dysuria.  Musculoskeletal: Negative for arthralgias and back pain.  Skin: Negative for color change and rash.  Neurological: Negative for facial asymmetry, speech difficulty, weakness, light-headedness, numbness and headaches.    Physical Exam Updated Vital Signs BP (!) 167/91   Pulse 87   Temp 99 F (37.2 C) (Oral)   Resp 15   SpO2 94%   Physical Exam Vitals and nursing note reviewed.  Constitutional:      Appearance: He is well-developed.  HENT:     Head: Normocephalic and atraumatic.  Eyes:     Conjunctiva/sclera: Conjunctivae normal.     Comments: Left pupil is dilated, history of cataract surgery, visual field is only light perception in the upper left eye otherwise visual fields normal and acuity is normal for him.  Cardiovascular:      Rate and Rhythm: Normal rate and regular rhythm.     Heart sounds: No murmur heard.   Pulmonary:     Effort: Pulmonary effort is normal. No respiratory distress.     Breath sounds: Normal breath sounds.  Abdominal:     Palpations: Abdomen is soft.     Tenderness: There is no abdominal tenderness.  Musculoskeletal:     Cervical back: Neck supple.  Skin:    General: Skin is warm and dry.  Neurological:     Mental Status: He is alert and oriented to person, place, and time.     Sensory: No sensory deficit.     Motor: No weakness.     Coordination: Coordination normal.     Gait: Gait normal.     ED Results / Procedures / Treatments   Labs (all labs ordered are listed, but only abnormal results are displayed) Labs Reviewed  COMPREHENSIVE METABOLIC PANEL - Abnormal; Notable for the following components:      Result Value   Glucose, Bld 125 (*)    All other components within normal limits  URINALYSIS, ROUTINE W REFLEX MICROSCOPIC - Abnormal; Notable for the following components:   Color, Urine STRAW (*)    Hgb urine dipstick SMALL (*)    All other components within normal limits  I-STAT CHEM 8, ED - Abnormal; Notable for the following components:   Glucose, Bld 121 (*)    Calcium, Ion 1.14 (*)    All other components within normal limits  ETHANOL  CBC  DIFFERENTIAL  RAPID URINE DRUG SCREEN, HOSP PERFORMED  PROTIME-INR  APTT    EKG None  Radiology CT Angio Head W or Wo Contrast  Result Date: 03/10/2020 CLINICAL DATA:  Left eye vision loss, eye doctor examination reports acute hemiretinal artery occlusion EXAM: CT ANGIOGRAPHY HEAD AND NECK TECHNIQUE: Multidetector CT imaging of the head and neck was performed using the standard protocol during bolus administration of intravenous contrast. Multiplanar CT image reconstructions and MIPs were obtained to evaluate the vascular anatomy. Carotid stenosis measurements (when applicable) are obtained utilizing NASCET criteria,  using the distal internal carotid  diameter as the denominator. CONTRAST:  61m OMNIPAQUE IOHEXOL 350 MG/ML SOLN COMPARISON:  None. FINDINGS: CT HEAD Brain: There is no acute intracranial hemorrhage, mass effect, or edema. Gray-white differentiation is preserved. There is no extra-axial fluid collection. Prominence of the ventricles and sulci reflects generalized parenchymal volume loss. Confluent areas of hypoattenuation in the supratentorial white matter are nonspecific but probably reflect moderate chronic microvascular ischemic changes. Vascular: No hyperdense vessel. Skull: Calvarium is unremarkable. Sinuses/Orbits: No acute finding. Other: None. Review of the MIP images confirms the above findings CTA NECK Aortic arch: Great vessel origins are patent with mild calcified plaque. Right carotid system: Common carotid is patent with mild calcified plaque. There is mild calcified plaque at the bifurcation and proximal internal carotid with minimal stenosis. Cervical internal carotid and external carotid arteries are patent Left carotid system: Common carotid is patent with mild calcified plaque. There is calcified plaque at the bifurcation and along the proximal internal carotid with mild less than 50% stenosis. Cervical internal carotid is patent with mild tortuosity proximally. External carotid is patent. Vertebral arteries: Extracranial vertebral arteries are patent with mild calcified plaque. No measurable stenosis. Skeleton: Degenerative changes of the included spine. Other neck: No mass or adenopathy. Upper chest: No apical lung mass. Review of the MIP images confirms the above findings CTA HEAD Anterior circulation: Intracranial internal carotid arteries patent with mild calcified plaque. Anterior and middle cerebral arteries are patent. Posterior circulation: Intracranial vertebral arteries are patent. There is extradural origin of the left PICA. Basilar artery is patent. Posterior cerebral arteries are  patent. Venous sinuses: Patent as allowed by contrast bolus timing. Review of the MIP images confirms the above findings IMPRESSION: No acute intracranial abnormality. Moderate chronic microvascular ischemic changes. Calcified plaque at the left greater than right ICA origins without hemodynamically significant stenosis. No proximal intracranial vessel occlusion or significant stenosis. Electronically Signed   By: PMacy MisM.D.   On: 03/10/2020 19:11   CT Angio Neck W and/or Wo Contrast  Result Date: 03/10/2020 CLINICAL DATA:  Left eye vision loss, eye doctor examination reports acute hemiretinal artery occlusion EXAM: CT ANGIOGRAPHY HEAD AND NECK TECHNIQUE: Multidetector CT imaging of the head and neck was performed using the standard protocol during bolus administration of intravenous contrast. Multiplanar CT image reconstructions and MIPs were obtained to evaluate the vascular anatomy. Carotid stenosis measurements (when applicable) are obtained utilizing NASCET criteria, using the distal internal carotid diameter as the denominator. CONTRAST:  760mOMNIPAQUE IOHEXOL 350 MG/ML SOLN COMPARISON:  None. FINDINGS: CT HEAD Brain: There is no acute intracranial hemorrhage, mass effect, or edema. Gray-white differentiation is preserved. There is no extra-axial fluid collection. Prominence of the ventricles and sulci reflects generalized parenchymal volume loss. Confluent areas of hypoattenuation in the supratentorial white matter are nonspecific but probably reflect moderate chronic microvascular ischemic changes. Vascular: No hyperdense vessel. Skull: Calvarium is unremarkable. Sinuses/Orbits: No acute finding. Other: None. Review of the MIP images confirms the above findings CTA NECK Aortic arch: Great vessel origins are patent with mild calcified plaque. Right carotid system: Common carotid is patent with mild calcified plaque. There is mild calcified plaque at the bifurcation and proximal internal carotid  with minimal stenosis. Cervical internal carotid and external carotid arteries are patent Left carotid system: Common carotid is patent with mild calcified plaque. There is calcified plaque at the bifurcation and along the proximal internal carotid with mild less than 50% stenosis. Cervical internal carotid is patent with mild tortuosity proximally. External carotid is  patent. Vertebral arteries: Extracranial vertebral arteries are patent with mild calcified plaque. No measurable stenosis. Skeleton: Degenerative changes of the included spine. Other neck: No mass or adenopathy. Upper chest: No apical lung mass. Review of the MIP images confirms the above findings CTA HEAD Anterior circulation: Intracranial internal carotid arteries patent with mild calcified plaque. Anterior and middle cerebral arteries are patent. Posterior circulation: Intracranial vertebral arteries are patent. There is extradural origin of the left PICA. Basilar artery is patent. Posterior cerebral arteries are patent. Venous sinuses: Patent as allowed by contrast bolus timing. Review of the MIP images confirms the above findings IMPRESSION: No acute intracranial abnormality. Moderate chronic microvascular ischemic changes. Calcified plaque at the left greater than right ICA origins without hemodynamically significant stenosis. No proximal intracranial vessel occlusion or significant stenosis. Electronically Signed   By: [name] M.D.   On: 03/10/2020 19:11    Procedures Procedures (including critical care time)  Medications Ordered in ED Medications  iohexol (OMNIPAQUE) 350 MG/ML injection 75 mL (75 mLs Intravenous Contrast Given 03/10/20 1849)  LORazepam (ATIVAN) tablet 2 mg (2 mg Oral Given 03/10/20 2033)    ED Course  I have reviewed the triage vital signs and the nursing notes.  Pertinent labs & imaging results that were available during my care of the patient were reviewed by me and considered in my medical decision  making (see chart for details).    MDM Rules/Calculators/A&P                          Sent from ophthalmology for concerns for retinal artery occlusion, left upper field.  Patient has history of A. fib, is on anticoagulation has been doing well.  No recent trauma no recent head injuries no recent illness.  Remainder of the neurologic exam is unremarkable.  Will reach out to neurology, will get vascular studies laboratory studies.  I spoke with the ophthalmologist on-call, they recommend neurology work-up, vascular studies are ordered the patient will be evaluated with a neurology consult.  CT imaging reviewed by radiology myself shows no acute intracranial hemorrhage, shows some vascular disease specifically worsening vascular disease in the left internal carotid, possibly the source for his retinal artery occlusion.  Neurologist recommended MRI echocardiogram carotid Dopplers and further work-up to optimize this patient medically for further stroke prevention.  Final Clinical Impression(s) / ED Diagnoses Final diagnoses:  Vision loss of left eye    Rx / DC Orders ED Discharge Orders    None       Breck Coons, MD 03/10/20 2035

## 2020-03-11 ENCOUNTER — Observation Stay (HOSPITAL_BASED_OUTPATIENT_CLINIC_OR_DEPARTMENT_OTHER): Payer: Medicare Other

## 2020-03-11 DIAGNOSIS — H34232 Retinal artery branch occlusion, left eye: Secondary | ICD-10-CM | POA: Diagnosis not present

## 2020-03-11 DIAGNOSIS — I351 Nonrheumatic aortic (valve) insufficiency: Secondary | ICD-10-CM | POA: Diagnosis not present

## 2020-03-11 DIAGNOSIS — H5462 Unqualified visual loss, left eye, normal vision right eye: Secondary | ICD-10-CM | POA: Diagnosis not present

## 2020-03-11 DIAGNOSIS — I4821 Permanent atrial fibrillation: Secondary | ICD-10-CM

## 2020-03-11 DIAGNOSIS — I361 Nonrheumatic tricuspid (valve) insufficiency: Secondary | ICD-10-CM

## 2020-03-11 DIAGNOSIS — I6521 Occlusion and stenosis of right carotid artery: Secondary | ICD-10-CM | POA: Diagnosis not present

## 2020-03-11 DIAGNOSIS — I350 Nonrheumatic aortic (valve) stenosis: Secondary | ICD-10-CM | POA: Diagnosis not present

## 2020-03-11 DIAGNOSIS — I771 Stricture of artery: Secondary | ICD-10-CM | POA: Diagnosis not present

## 2020-03-11 DIAGNOSIS — H349 Unspecified retinal vascular occlusion: Secondary | ICD-10-CM | POA: Diagnosis not present

## 2020-03-11 LAB — ECHOCARDIOGRAM COMPLETE
AR max vel: 1.74 cm2
AV Area VTI: 1.61 cm2
AV Area mean vel: 1.91 cm2
AV Mean grad: 24.5 mmHg
AV Peak grad: 42.8 mmHg
Ao pk vel: 3.27 m/s
Area-P 1/2: 5.27 cm2
Calc EF: 58.3 %
Height: 70 in
P 1/2 time: 404 msec
S' Lateral: 3.3 cm
Single Plane A2C EF: 59 %
Single Plane A4C EF: 53.8 %
Weight: 3026.47 oz

## 2020-03-11 LAB — APTT: aPTT: 30 seconds (ref 24–36)

## 2020-03-11 LAB — HEMOGLOBIN A1C
Hgb A1c MFr Bld: 5.6 % (ref 4.8–5.6)
Mean Plasma Glucose: 114.02 mg/dL

## 2020-03-11 LAB — RESPIRATORY PANEL BY RT PCR (FLU A&B, COVID)
Influenza A by PCR: NEGATIVE
Influenza B by PCR: NEGATIVE
SARS Coronavirus 2 by RT PCR: NEGATIVE

## 2020-03-11 LAB — LIPID PANEL
Cholesterol: 152 mg/dL (ref 0–200)
HDL: 52 mg/dL (ref >40)
LDL Cholesterol: 85 mg/dL (ref 0–99)
Total CHOL/HDL Ratio: 2.9 RATIO
Triglycerides: 73 mg/dL (ref <150)
VLDL: 15 mg/dL (ref 0–40)

## 2020-03-11 LAB — PROTIME-INR
INR: 1.1 (ref 0.8–1.2)
Prothrombin Time: 13.9 seconds (ref 11.4–15.2)

## 2020-03-11 MED ORDER — HYDROCHLOROTHIAZIDE 12.5 MG PO TABS
ORAL_TABLET | ORAL | Status: DC
Start: 2020-03-13 — End: 2020-03-17

## 2020-03-11 MED ORDER — ASPIRIN EC 81 MG PO TBEC: 81.0000 mg | DELAYED_RELEASE_TABLET | Freq: Every day | ORAL | 2 refills | Status: DC

## 2020-03-11 NOTE — Progress Notes (Addendum)
STROKE TEAM PROGRESS NOTE    Interval History   NAEON, patient educated about BRAO, stroke risk factors and plan to consult vascular surgery   Pertinent Lab Work and Imaging    CT Head WO IV Contrast 03/10/20 There is no acute intracranial hemorrhage, mass effect, or edema. Gray-white differentiation is preserved. There is no extra-axial fluid collection. Prominence of the ventricles and sulci reflects generalized parenchymal volume loss. Confluent areas of hypoattenuation in the supratentorial white matter are nonspecific but probably reflect moderate chronic microvascular ischemic Changes.  CT Angio Head and Neck W WO IV Contrast 03/10/20 CTA NECK Aortic arch: Great vessel origins are patent with mild calcified plaque. Right carotid system: Common carotid is patent with mild calcified plaque. There is mild calcified plaque at the bifurcation and proximal internal carotid with minimal stenosis. Cervical internal carotid and external carotid arteries are patent Left carotid system: Common carotid is patent with mild calcified plaque. There is calcified plaque at the bifurcation and along the proximal internal carotid with mild less than 50% stenosis. Cervical internal carotid is patent with mild tortuosity proximally. External carotid is patent. Vertebral arteries: Extracranial vertebral arteries are patent with mild calcified plaque. No measurable stenosis. Skeleton: Degenerative changes of the included spine. Other neck: No mass or adenopathy. Upper chest: No apical lung mass.  CTA HEAD Anterior circulation: Intracranial internal carotid arteries patent with mild calcified plaque. Anterior and middle cerebral arteries are patent. Posterior circulation: Intracranial vertebral arteries are patent. There is extradural origin of the left PICA. Basilar artery is patent. Posterior cerebral arteries are patent. Venous sinuses: Patent as allowed by contrast bolus  timing.  MRI Brain WO IV Contrast 03/10/20 1. No acute intracranial abnormality. 2. Generalized age-related cerebral atrophy with chronic small vessel ischemic disease. Superimposed subcentimeter remote lacunar infarct at the right thalamus.  Echocardiogram Complete 03/11/20 1. Left ventricular ejection fraction, by estimation, is 55 to 60%. The  left ventricle has normal function. The left ventricle has no regional  wall motion abnormalities. There is mild concentric left ventricular  hypertrophy. Left ventricular diastolic  parameters are indeterminate.  2. Right ventricular systolic function is moderately reduced. The right  ventricular size is mildly enlarged. There is moderately elevated  pulmonary artery systolic pressure. The estimated right ventricular  systolic pressure is 16.1 mmHg.  3. Left atrial size was moderately dilated.  4. The mitral valve is grossly normal. No evidence of mitral valve  regurgitation. Moderate mitral annular calcification.  5. Tricuspid valve regurgitation is mild to moderate.  6. The aortic valve is calcified. There is severe calcifcation of the  aortic valve. Aortic valve regurgitation is moderate. Mild to moderate  aortic valve stenosis.  7. There is dilatation of the ascending aorta, measuring 42 mm.  8. The inferior vena cava is dilated in size with <50% respiratory  variability, suggesting right atrial pressure of 15 mmHg.   Comparison(s): A prior study was performed on 09/25/19. Compared to prior:  stable aortic valve regurgitation and stenosis, stable ascending aortic  size, stable RV function, stable mitral calcifications, slight increase in  tricuspid regurgitation.   Physical Examination   Constitutional: Calm, appropriate for condition  Cardiovascular: Normal RR Respiratory: No increased WOB   Mental status: AAOx4, follows commands Speech: Fluent with repetition and naming intact, no dysarthria  Cranial nerves: EOMI, Left  monocular superior altitudinal defect rest of visual fields are full to left eye, right visual fields full, Face symmetric, Shoulder shrug intact, tongue midline  Motor: Normal bulk  and tone. No drift.   Dlt Bic [name] FgS Grp HF  KnF KnE PIF DoF  R 5 5 5 5 5 5 5 5 5 5   L 5 5 5 5 5 5 5 5 5 5   Sensory: Intact to light tough throughout  Coordination: Intact FNF + HTS  Reflexes: Not assessed Gait: Deferred  NIHSS: 1 for visual   Assessment and Plan   [name] is a 85 y.o. male w/pmh of atrial fibrillation on anticoagulation with Eliquis, CAD, hypertension, hyperlipidemia who presents with sudden onset of monocular superior altitudinal deficit in the left eye, with ophthalmological evidence of intravascular plaque on detailed eye exam by ophthalmology-likely [place]st plaque versus embolus. He was not a candidate for IV TPA due to being on Eliquis. Not a candidate for EVT due to no emergent LVO  #L BRAO  Patient presented with the symptoms described above consistent with a branch retinal artery occlusion. MRI of the Brain was negative for acute stroke. Vessel imaging with CTA Head and Neck was pertinent for calcified plaque at the bifurcation of the left carotid and along the proximal left internal carotid with mild less than 50% stenosis. Echocardiogram was negative for intracardiac source of stroke and showed EF 55- 60 %, moderately dilated left atrium and reduced right ventricular systolic function (view full report above). Stroke labs were completed including Lipid panel w/LDL 85 and Hemoglobin A1C 5.6. 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 He has been placed on ASA 325 this admission and home Pravastatin was continued for secondary stroke prevention.  -Continue ASA 325 for secondary stroke prevention -Continue  Pravastatin 40 mg for secondary stroke prevention   - F/U vascular recommendations and decide on when to resume Eliquis based on their recommendations  -At discharge please place ambulatory referral to neurology for stroke follow up   #Hypertension #Atrial Fibrillation  He has a history of HTN + atrial fibrillation and takes HCTZ 25 mg QD and Cardizem 120 mg QD at home. Currently blood pressure is trending 130-170. Recommend an acute SBP goal of < 180 and slowly normalizing pressure avoiding any acute drops. His Cardizem has been resumed this admission would recommend starting anti hypertensive at a half dose when initiating them.  - Acute SBP goal < 180, slowly normalize without acute drops   #Hyperlipidemia LDL noted to be 85. From a stroke prevention stand point, the LDL goal is < 70. Will continue with home Pravastatin 40 this admission and recommend to continue this at discharge.   Hospital day # 0  Ruta Hinds, NP  [facility] Nurse Practitioner Patient seen and discussed with attending physician Dr. Erlinda Hong   ATTENDING NOTE: I reviewed above note and agree with the assessment and plan. Pt was seen and examined.   85 year old male with history of A. fib on Eliquis, CAD, hypertension, hyperlipidemia presented with right eye upper visual field vision loss.  Ophthalmologist saw [name] plaque in the branch of the retinal artery.  CTA head and neck no high-grade stenosis, however left ICA bulb calcified plaque with soft plaque versus plaque ulceration, indicating high risk plaque.  MRI no acute infarct.  EF 55 to 60%.  LDL 85 and A1c 5.6.  Patient walking around in the room during rounding, neurologically intact except left eye upper visual field vision loss.  Etiology  of patient's symptoms likely due to left ICA unstable plaque without high-grade stenosis.  Dr. Trula Slade from vascular surgery saw patient and agree with high risk plaque, plan to have left CEA in the near future.   Recommend to continue Eliquis and also add on aspirin 81.  Continue pravastatin.  Patient can be discharged from neuro standpoint, with close neurology follow-up and vascular surgery follow-up.  Rosalin Hawking, MD PhD Stroke Neurology 03/11/2020 5:31 PM   To contact Stroke Continuity provider, please refer to [email or web address] After hours, contact General Neurology

## 2020-03-11 NOTE — Progress Notes (Signed)
  Echocardiogram 2D Echocardiogram has been performed.  Bobbye Charleston 03/11/2020, 10:18 AM

## 2020-03-11 NOTE — Evaluation (Signed)
Occupational Therapy Evaluation Patient Details Name: [name] [name] MRN: [id number] DOB: 09-23-1934 Today's Date: 03/11/2020    History of Present Illness 85 yo male with loss of vision in L eye involving upper quadrants MRI remote lacunar infarct R thalamus PMH L retina artery occlusion CAD s/p CAB AFIB on eliquis HTN    Clinical Impression   PT admitted with L visual field changes. Pt currently with functional limitiations due to the deficits listed below (see OT problem list). Pt currently with L upper quadrant changes but able to complete adls MOD I. Pt demonstrates good problem solving and compensatory strategies.  Pt will benefit from skilled OT to increase their independence and safety with adls and balance to allow discharge home.     Follow Up Recommendations  No OT follow up    Equipment Recommendations  None recommended by OT    Recommendations for Other Services       Precautions / Restrictions Precautions Precautions: Fall Precaution Comments: L visual field deficits      Mobility Bed Mobility Overal bed mobility: Independent             General bed mobility comments: pt states he always sits EOB for a few minutes prior to standing    Transfers Overall transfer level: Modified independent Equipment used: None             General transfer comment: education to stand for a few minutes by the bed prior to ambulating    Balance Overall balance assessment: Mild deficits observed, not formally tested                                         ADL either performed or assessed with clinical judgement   ADL Overall ADL's : Modified independent                                             Vision Baseline Vision/History: Wears glasses Wears Glasses: At all times Patient Visual Report: Other (comment) Vision Assessment?: Yes Ocular Range of Motion: Within Functional Limits Alignment/Gaze Preference: Head  tilt Visual Fields: Left visual field deficit (superior visual field deficits ) Additional Comments: pt able to verbalize that he is only have visual deficits in L eye. pt reports taht when looking at therapist that the upper quadrants only see a darker color gray color and not a whole person. pt is able to use ocular movement to see the entire person. pt starting to compensate with head tilts and neck rotation. pt alerted to this. pt give x2 visual task with 100% accuracy. pt advised on home life safety concerns pt advised to talk to MD regarding return to driving and when allowed to avoid driving in busy area to start. pt advised to use more parking lot trails first     Perception     Praxis      Pertinent Vitals/Pain Pain Assessment: No/denies pain     Hand Dominance Left   Extremity/Trunk Assessment Upper Extremity Assessment Upper Extremity Assessment: Overall WFL for tasks assessed   Lower Extremity Assessment Lower Extremity Assessment: Overall WFL for tasks assessed   Cervical / Trunk Assessment Cervical / Trunk Assessment: Normal   Communication Communication Communication: No difficulties   Cognition Arousal/Alertness: Awake/alert Behavior During Therapy:  WFL for tasks assessed/performed Overall Cognitive Status: Within Functional Limits for tasks assessed                                 General Comments: pt requiring redirection to attend to task, pt with increased details when answering questions   General Comments  educated on stroke symptoms and signs    Exercises     Shoulder Instructions      Home Living Family/patient expects to be discharged to:: Private residence Living Arrangements: Spouse/significant other Available Help at Discharge: Family;Available 24 hours/day Type of Home: House Home Access: Stairs to enter [employer] of Steps: 1   Home Layout: Multi-level;1/2 bath on main level Alternate Level Stairs-Number of  Steps: split level with 7 steps up and 7 steps down with railing on R going up Alternate Level Stairs-Rails: Right Bathroom Shower/Tub: Walk-in shower (tub shower upstairs / 1/2 bath main level)   Bathroom Toilet: Handicapped height     Home Equipment: None   Additional Comments: driving , normal household chores was blowing leaves at times of event,       Prior Functioning/Environment Level of Independence: Independent                 OT Problem List: Other (comment) (vision changes)      OT Treatment/Interventions: Visual/perceptual remediation/compensation    OT Goals(Current goals can be found in the care plan section) Acute Rehab OT Goals Patient Stated Goal: I would like to go home OT Goal Formulation: With patient Time For Goal Achievement: 03/25/20 Potential to Achieve Goals: Good  OT Frequency: Min 2X/week   Barriers to D/C:            Co-evaluation              AM-PAC OT "6 Clicks" Daily Activity     Outcome Measure Help from another person eating meals?: None Help from another person taking care of personal grooming?: None Help from another person toileting, which includes using toliet, bedpan, or urinal?: None Help from another person bathing (including washing, rinsing, drying)?: None Help from another person to put on and taking off regular upper body clothing?: None Help from another person to put on and taking off regular lower body clothing?: None 6 Click Score: 24   End of Session Nurse Communication: Mobility status;Precautions  Activity Tolerance: Patient tolerated treatment well Patient left: in bed;with call bell/phone within reach  OT Visit Diagnosis: Low vision, both eyes (H54.2)                Time: 5462-7035 OT Time Calculation (min): 53 min Charges:  OT General Charges $OT Visit: 1 Visit OT Evaluation $OT Eval Moderate Complexity: 1 Mod OT Treatments $Self Care/Home Management : 8-22 mins   Brynn, OTR/L  Acute  Rehabilitation Services Pager: [phone] Office: [phone] .   Jeri Modena 03/11/2020, 2:13 PM

## 2020-03-11 NOTE — Plan of Care (Signed)
  Problem: Education: Goal: Individualized Educational Video(s) Outcome: Progressing   Problem: Education: Goal: Knowledge of secondary prevention will improve Outcome: Progressing   Problem: Education: Goal: Knowledge of disease or condition will improve Outcome: Progressing   

## 2020-03-11 NOTE — Progress Notes (Signed)
   03/11/20 0145  Vitals  Temp 98.2 F (36.8 C)  Temp Source Oral  BP (!) 166/98  MAP (mmHg) 114  BP Location Left Arm  BP Method Automatic  Patient Position (if appropriate) Lying  Pulse Rate 90  ECG Heart Rate 92  Resp 19  MEWS COLOR  MEWS Score Color Green  Oxygen Therapy  SpO2 97 %  O2 Device Room Air  Pain Assessment  Pain Scale 0-10  Pain Score 0  MEWS Score  MEWS Temp 0  MEWS Systolic 0  MEWS Pulse 0  MEWS RR 0  MEWS LOC 0  MEWS Score 0  Admitted  from  [place] with sudden loss of vision in the left eye. With Dx left  Retina Artery occlusion.

## 2020-03-11 NOTE — Discharge Summary (Signed)
Physician Discharge Summary  WHALEN TROMPETER ENI:[id number] DOB: 1934/10/09 DOA: 03/10/2020  PCP: Tonia Ghent, MD  Admit date: 03/10/2020 Discharge date: 03/11/2020  Admitted From: home Discharge disposition: home   Recommendations for Outpatient Follow-Up:   Ambulatory referral to neurology Close follow up with vascular for CEA:  will need to be off of his Eliquis for 48 hours prior to his surgery. ASA 81 mg  Discharge Diagnosis:   Principal Problem:   Retinal artery occlusion Active Problems:   CAD (coronary artery disease), autologous vein bypass graft   Atrial fibrillation [facility])    Discharge Condition: Improved.  Diet recommendation: Low sodium, heart healthy.  Wound care: None.  Code status: Full.   History of Present Illness:   [name] [name] is a 85 y.o. male with medical history significant for CAD status post CABG, atrial fibrillation on Eliquis, and hypertension, now presenting to emergency department with blurred vision involving the upper fields of his left eye.  Patient reports developing blurred vision at around noon today, only involving the upper fields of his left eye, was seen in an ophthalmology clinic for this, and sent to the emergency department with concern for acute hemiretinal artery occlusion.  Patient reports that he was in his usual state leading up to this, denies any focal numbness or weakness, and denies missing any doses of his Eliquis.   Hospital Course by Problem:   Acute BRAO - Patient developed blurred vision involving the upper fields of left eye around noon today, saw ophthalmology who sent him to ED with concern for acute hemiretinal artery occlusion  - MRI of the Brain was negative for acute stroke. Vessel imaging with CTA Head and Neck was pertinent for calcified plaque at the bifurcation of the left carotid and along the proximal left internal carotid with mild less than 50% stenosis. Echocardiogram was  negative for intracardiac source of stroke and showed EF 55- 60 %, moderately dilated left atrium and reduced right ventricular systolic function  - Neurology consulted: recommend vascular consult and ASA (81mg ) plus eliquis -vascular consulted: Because of the tortuous nature of his carotid artery, I feel like the best option is carotid endarterectomy.  The patient would like to go home this evening which I think would be appropriate.  We can schedule him for carotid endarterectomy in the near future.  He will need to be off of his Eliquis for 48 hours prior to his surgery. -will need CEA -ASA plus eliquis for now   Atrial fibrillation- permanent  - CHADS-VASc is 86 (age x2, CAD, HTN, CVA x2)  - Hold Eliquis for now per neurlogy recommendations, continue diltiazem for rate-control    CAD - No anginal complaints, continue statin    Hypertension  -permissive HTN -resume home BP meds for goal of normotensive    Medical Consultants:   Neuro vascular   Discharge Exam:   Vitals:   03/11/20 1124 03/11/20 1147  BP: (!) 153/87   Pulse: 87 94  Resp:    Temp: 98.1 F (36.7 C)   SpO2: 97% 96%   Vitals:   03/11/20 0500 03/11/20 0700 03/11/20 1124 03/11/20 1147  BP: 137/79 (!) 141/92 (!) 153/87   Pulse: 78 90 87 94  Resp: 14 (!) 21    Temp: 98.1 F (36.7 C) 98.3 F (36.8 C) 98.1 F (36.7 C)   TempSrc: Oral Oral    SpO2: 99%  97% 96%  Weight:      Height:  General exam: Appears calm and comfortable.   The results of significant diagnostics from this hospitalization (including imaging, microbiology, ancillary and laboratory) are listed below for reference.     Procedures and Diagnostic Studies:   CT Angio Head W or Wo Contrast  Result Date: 03/10/2020 CLINICAL DATA:  Left eye vision loss, eye doctor examination reports acute hemiretinal artery occlusion EXAM: CT ANGIOGRAPHY HEAD AND NECK TECHNIQUE: Multidetector CT imaging of the head and neck was performed using  the standard protocol during bolus administration of intravenous contrast. Multiplanar CT image reconstructions and MIPs were obtained to evaluate the vascular anatomy. Carotid stenosis measurements (when applicable) are obtained utilizing NASCET criteria, using the distal internal carotid diameter as the denominator. CONTRAST:  76mL OMNIPAQUE IOHEXOL 350 MG/ML SOLN COMPARISON:  None. FINDINGS: CT HEAD Brain: There is no acute intracranial hemorrhage, mass effect, or edema. Gray-white differentiation is preserved. There is no extra-axial fluid collection. Prominence of the ventricles and sulci reflects generalized parenchymal volume loss. Confluent areas of hypoattenuation in the supratentorial white matter are nonspecific but probably reflect moderate chronic microvascular ischemic changes. Vascular: No hyperdense vessel. Skull: Calvarium is unremarkable. Sinuses/Orbits: No acute finding. Other: None. Review of the MIP images confirms the above findings CTA NECK Aortic arch: Great vessel origins are patent with mild calcified plaque. Right carotid system: Common carotid is patent with mild calcified plaque. There is mild calcified plaque at the bifurcation and proximal internal carotid with minimal stenosis. Cervical internal carotid and external carotid arteries are patent Left carotid system: Common carotid is patent with mild calcified plaque. There is calcified plaque at the bifurcation and along the proximal internal carotid with mild less than 50% stenosis. Cervical internal carotid is patent with mild tortuosity proximally. External carotid is patent. Vertebral arteries: Extracranial vertebral arteries are patent with mild calcified plaque. No measurable stenosis. Skeleton: Degenerative changes of the included spine. Other neck: No mass or adenopathy. Upper chest: No apical lung mass. Review of the MIP images confirms the above findings CTA HEAD Anterior circulation: Intracranial internal carotid arteries  patent with mild calcified plaque. Anterior and middle cerebral arteries are patent. Posterior circulation: Intracranial vertebral arteries are patent. There is extradural origin of the left PICA. Basilar artery is patent. Posterior cerebral arteries are patent. Venous sinuses: Patent as allowed by contrast bolus timing. Review of the MIP images confirms the above findings IMPRESSION: No acute intracranial abnormality. Moderate chronic microvascular ischemic changes. Calcified plaque at the left greater than right ICA origins without hemodynamically significant stenosis. No proximal intracranial vessel occlusion or significant stenosis. Electronically Signed   By: [name] M.D.   On: 03/10/2020 19:11   CT Angio Neck W and/or Wo Contrast  Result Date: 03/10/2020 CLINICAL DATA:  Left eye vision loss, eye doctor examination reports acute hemiretinal artery occlusion EXAM: CT ANGIOGRAPHY HEAD AND NECK TECHNIQUE: Multidetector CT imaging of the head and neck was performed using the standard protocol during bolus administration of intravenous contrast. Multiplanar CT image reconstructions and MIPs were obtained to evaluate the vascular anatomy. Carotid stenosis measurements (when applicable) are obtained utilizing NASCET criteria, using the distal internal carotid diameter as the denominator. CONTRAST:  80mL OMNIPAQUE IOHEXOL 350 MG/ML SOLN COMPARISON:  None. FINDINGS: CT HEAD Brain: There is no acute intracranial hemorrhage, mass effect, or edema. Gray-white differentiation is preserved. There is no extra-axial fluid collection. Prominence of the ventricles and sulci reflects generalized parenchymal volume loss. Confluent areas of hypoattenuation in the supratentorial white matter are nonspecific but probably reflect  moderate chronic microvascular ischemic changes. Vascular: No hyperdense vessel. Skull: Calvarium is unremarkable. Sinuses/Orbits: No acute finding. Other: None. Review of the MIP images confirms  the above findings CTA NECK Aortic arch: Great vessel origins are patent with mild calcified plaque. Right carotid system: Common carotid is patent with mild calcified plaque. There is mild calcified plaque at the bifurcation and proximal internal carotid with minimal stenosis. Cervical internal carotid and external carotid arteries are patent Left carotid system: Common carotid is patent with mild calcified plaque. There is calcified plaque at the bifurcation and along the proximal internal carotid with mild less than 50% stenosis. Cervical internal carotid is patent with mild tortuosity proximally. External carotid is patent. Vertebral arteries: Extracranial vertebral arteries are patent with mild calcified plaque. No measurable stenosis. Skeleton: Degenerative changes of the included spine. Other neck: No mass or adenopathy. Upper chest: No apical lung mass. Review of the MIP images confirms the above findings CTA HEAD Anterior circulation: Intracranial internal carotid arteries patent with mild calcified plaque. Anterior and middle cerebral arteries are patent. Posterior circulation: Intracranial vertebral arteries are patent. There is extradural origin of the left PICA. Basilar artery is patent. Posterior cerebral arteries are patent. Venous sinuses: Patent as allowed by contrast bolus timing. Review of the MIP images confirms the above findings IMPRESSION: No acute intracranial abnormality. Moderate chronic microvascular ischemic changes. Calcified plaque at the left greater than right ICA origins without hemodynamically significant stenosis. No proximal intracranial vessel occlusion or significant stenosis. Electronically Signed   By: [name] M.D.   On: 03/10/2020 19:11   MR BRAIN WO CONTRAST  Result Date: 03/10/2020 CLINICAL DATA:  Initial evaluation for acute neuro deficit, stroke suspected, visual loss. EXAM: MRI HEAD WITHOUT CONTRAST TECHNIQUE: Multiplanar, multiecho pulse sequences of the  brain and surrounding structures were obtained without intravenous contrast. COMPARISON:  Prior CTA from earlier same day. FINDINGS: Brain: Generalized age-related cerebral atrophy. Patchy and confluent T2/FLAIR hyperintensity within the periventricular deep white matter both cerebral hemispheres, most consistent with chronic small vessel ischemic disease, mild to moderate in nature. Superimposed remote lacunar infarct with chronic hemosiderin staining noted at the right thalamus. No abnormal foci of restricted diffusion to suggest acute or subacute ischemia. Gray-white matter differentiation maintained. No encephalomalacia to suggest chronic cortical infarction. No other evidence for acute or chronic intracranial hemorrhage. No mass lesion, midline shift or mass effect. No hydrocephalus or extra-axial fluid collection. Pituitary gland within normal limits. Suprasellar region normal. Midline structures intact. Vascular: Major intracranial vascular flow voids are maintained. Skull and upper cervical spine: Craniocervical junction within normal limits. Bone marrow signal intensity normal. No scalp soft tissue abnormality. Sinuses/Orbits: Patient status post bilateral ocular lens replacement. Globes and orbital soft tissues demonstrate no acute finding. Scattered mucosal thickening noted within the ethmoidal air cells and maxillary sinuses. Paranasal sinuses are otherwise clear. No significant mastoid effusion. Inner ear structures grossly normal. Other: None. IMPRESSION: 1. No acute intracranial abnormality. 2. Generalized age-related cerebral atrophy with chronic small vessel ischemic disease. Superimposed subcentimeter remote lacunar infarct at the right thalamus. Electronically Signed   By: Jeannine Boga M.D.   On: 03/10/2020 23:29   ECHOCARDIOGRAM COMPLETE  Result Date: 03/11/2020    ECHOCARDIOGRAM REPORT   Patient Name:   MADISON ALBEA Date of Exam: 03/11/2020 Medical Rec #:  [id number]            Height:       70.0 in Accession #:    [id number]  Weight:       189.2 lb Date of Birth:  [birth year]           BSA:          2.038 m Patient Age:    6 years            BP:           141/92 mmHg Patient Gender: M                   HR:           75 bpm. Exam Location:  Inpatient Procedure: 2D Echo, Cardiac Doppler and Color Doppler Indications:    TIA  History:        Patient has prior history of Echocardiogram examinations, most                 recent 09/25/2019. CAD, Prior CABG and Abnormal ECG, Aortic Valve                 Disease, Arrythmias:Atrial Fibrillation, Signs/Symptoms:Syncope;                 Risk Factors:Dyslipidemia. Moderate aortic stenosis. Retinal                 artery occlusion.  Sonographer:    Roseanna Rainbow RDCS Referring Phys: [id number] [place]  1. Left ventricular ejection fraction, by estimation, is 55 to 60%. The left ventricle has normal function. The left ventricle has no regional wall motion abnormalities. There is mild concentric left ventricular hypertrophy. Left ventricular diastolic parameters are indeterminate.  2. Right ventricular systolic function is moderately reduced. The right ventricular size is mildly enlarged. There is moderately elevated pulmonary artery systolic pressure. The estimated right ventricular systolic pressure is 59.1 mmHg.  3. Left atrial size was moderately dilated.  4. The mitral valve is grossly normal. No evidence of mitral valve regurgitation. Moderate mitral annular calcification.  5. Tricuspid valve regurgitation is mild to moderate.  6. The aortic valve is calcified. There is severe calcifcation of the aortic valve. Aortic valve regurgitation is moderate. Mild to moderate aortic valve stenosis.  7. There is dilatation of the ascending aorta, measuring 42 mm.  8. The inferior vena cava is dilated in size with <50% respiratory variability, suggesting right atrial pressure of 15 mmHg. Comparison(s): A prior study was performed on 09/25/19.  Compared to prior: stable aortic valve regurgitation and stenosis, stable ascending aortic size, stable RV function, stable mitral calcifications, slight increase in tricuspid regurgitation. FINDINGS  Left Ventricle: LVMI 120 g/m2; RWT 0.57. Left ventricular ejection fraction, by estimation, is 55 to 60%. The left ventricle has normal function. The left ventricle has no regional wall motion abnormalities. The left ventricular internal cavity size was  normal in size. There is mild concentric left ventricular hypertrophy. Left ventricular diastolic parameters are indeterminate. Right Ventricle: The right ventricular size is mildly enlarged. No increase in right ventricular wall thickness. Right ventricular systolic function is moderately reduced. There is moderately elevated pulmonary artery systolic pressure. The tricuspid regurgitant velocity is 2.88 m/s, and with an assumed right atrial pressure of 15 mmHg, the estimated right ventricular systolic pressure is 63.8 mmHg. Left Atrium: Left atrial size was moderately dilated. Right Atrium: Right atrial size was normal in size. Pericardium: There is no evidence of pericardial effusion. Mitral Valve: The mitral valve is grossly normal. Moderate mitral annular calcification. No evidence of mitral valve regurgitation. Tricuspid Valve: The tricuspid valve is  grossly normal. Tricuspid valve regurgitation is mild to moderate. Aortic Valve: AoV Color Doppler worst in A5c View. The aortic valve is calcified. There is severe calcifcation of the aortic valve. Aortic valve regurgitation is moderate. Aortic regurgitation PHT measures 404 msec. Mild to moderate aortic stenosis is present. Aortic valve mean gradient measures 24.5 mmHg. Aortic valve peak gradient measures 42.8 mmHg. Aortic valve area, by VTI measures 1.61 cm. Pulmonic Valve: The pulmonic valve was normal in structure. Pulmonic valve regurgitation is not visualized. Aorta: AA indexed to height 2.39, low risk. The  aortic root is normal in size and structure. There is dilatation of the ascending aorta, measuring 42 mm. Venous: The inferior vena cava is dilated in size with less than 50% respiratory variability, suggesting right atrial pressure of 15 mmHg. IAS/Shunts: The atrial septum is grossly normal.  LEFT VENTRICLE PLAX 2D LVIDd:         4.60 cm     Diastology LVIDs:         3.30 cm     LV e' medial:    10.80 cm/s LV PW:         1.30 cm     LV E/e' medial:  9.4 LV IVS:        1.30 cm     LV e' lateral:   11.60 cm/s LVOT diam:     2.40 cm     LV E/e' lateral: 8.8 LV SV:         122 LV SV Index:   60 LVOT Area:     4.52 cm  LV Volumes (MOD) LV vol d, MOD A2C: 97.8 ml LV vol d, MOD A4C: 90.7 ml LV vol s, MOD A2C: 40.1 ml LV vol s, MOD A4C: 41.9 ml LV SV MOD A2C:     57.7 ml LV SV MOD A4C:     90.7 ml LV SV MOD BP:      57.2 ml RIGHT VENTRICLE            IVC RV S prime:     7.07 cm/s  IVC diam: 2.30 cm TAPSE (M-mode): 0.9 cm LEFT ATRIUM           Index       RIGHT ATRIUM           Index LA diam:      4.10 cm 2.01 cm/m  RA Area:     18.30 cm LA Vol (A2C): 84.0 ml 41.21 ml/m RA Volume:   50.00 ml  24.53 ml/m LA Vol (A4C): 93.9 ml 46.06 ml/m  AORTIC VALVE                    PULMONIC VALVE AV Area (Vmax):    1.74 cm     PR End Diast Vel: 2.02 msec AV Area (Vmean):   1.91 cm AV Area (VTI):     1.61 cm AV Vmax:           327.00 cm/s AV Vmean:          232.250 cm/s AV VTI:            0.756 m AV Peak Grad:      42.8 mmHg AV Mean Grad:      24.5 mmHg LVOT Vmax:         126.00 cm/s LVOT Vmean:        98.300 cm/s LVOT VTI:          0.269 m LVOT/AV VTI ratio:  0.36 AI PHT:            404 msec  AORTA Ao Root diam: 3.90 cm Ao Asc diam:  4.20 cm MITRAL VALVE                TRICUSPID VALVE MV Area (PHT): 5.27 cm     TR Peak grad:   33.2 mmHg MV Decel Time: 144 msec     TR Vmax:        288.00 cm/s MV E velocity: 101.50 cm/s                             SHUNTS                             Systemic VTI:  0.27 m                              Systemic Diam: 2.40 cm Rudean Haskell MD Electronically signed by Rudean Haskell MD Signature Date/Time: 03/11/2020/1:01:44 PM    Final      Labs:   Basic Metabolic Panel: Recent Labs  Lab 03/10/20 1743 03/10/20 1759  NA 139 141  K 3.8 3.8  CL 101 101  CO2 28  --   GLUCOSE 125* 121*  BUN 17 22  CREATININE 1.19 1.10  CALCIUM 9.3  --    GFR Estimated Creatinine Clearance: 52.5 mL/min (by C-G formula based on SCr of 1.1 mg/dL). Liver Function Tests: Recent Labs  Lab 03/10/20 1743  AST 28  ALT 15  ALKPHOS 65  BILITOT 0.5  PROT 7.0  ALBUMIN 3.9   No results for input(s): LIPASE, AMYLASE in the last 168 hours. No results for input(s): AMMONIA in the last 168 hours. Coagulation profile Recent Labs  Lab 03/11/20 0341  INR 1.1    CBC: Recent Labs  Lab 03/10/20 1743 03/10/20 1759  WBC 8.1  --   NEUTROABS 5.0  --   HGB 13.8 14.3  HCT 41.7 42.0  MCV 96.8  --   PLT 206  --    Cardiac Enzymes: No results for input(s): CKTOTAL, CKMB, CKMBINDEX, TROPONINI in the last 168 hours. BNP: Invalid input(s): POCBNP CBG: No results for input(s): GLUCAP in the last 168 hours. D-Dimer No results for input(s): DDIMER in the last 72 hours. Hgb A1c Recent Labs    03/11/20 0341  HGBA1C 5.6   Lipid Profile Recent Labs    03/11/20 0341  CHOL 152  HDL 52  LDLCALC 85  TRIG 73  CHOLHDL 2.9   Thyroid function studies No results for input(s): TSH, T4TOTAL, T3FREE, THYROIDAB in the last 72 hours.  Invalid input(s): FREET3 Anemia work up No results for input(s): VITAMINB12, FOLATE, FERRITIN, TIBC, IRON, RETICCTPCT in the last 72 hours. Microbiology Recent Results (from the past 240 hour(s))  Respiratory Panel by RT PCR (Flu A&B, Covid) - Nasopharyngeal Swab     Status: None   Collection Time: 03/11/20 12:01 AM   Specimen: Nasopharyngeal Swab  Result Value Ref Range Status   SARS Coronavirus 2 by RT PCR NEGATIVE NEGATIVE Final    Comment:  (NOTE) SARS-CoV-2 target nucleic acids are NOT DETECTED.  The SARS-CoV-2 RNA is generally detectable in upper respiratoy specimens during the acute phase of infection. The lowest concentration of SARS-CoV-2 viral copies this assay can detect is 131 copies/mL. A negative  result does not preclude SARS-Cov-2 infection and should not be used as the sole basis for treatment or other patient management decisions. A negative result may occur with  improper specimen collection/handling, submission of specimen other than nasopharyngeal swab, presence of viral mutation(s) within the areas targeted by this assay, and inadequate number of viral copies (<131 copies/mL). A negative result must be combined with clinical observations, patient history, and epidemiological information. The expected result is Negative.  Fact Sheet for Patients:  PinkCheek.be  Fact Sheet for Healthcare Providers:  GravelBags.it  This test is no t yet approved or cleared by the [place] FDA and  has been authorized for detection and/or diagnosis of SARS-CoV-2 by FDA under an Emergency Use Authorization (EUA). This EUA will remain  in effect (meaning this test can be used) for the duration of the COVID-19 declaration under Section 564(b)(1) of the Act, 21 U.S.C. section 360bbb-3(b)(1), unless the authorization is terminated or revoked sooner.     Influenza A by PCR NEGATIVE NEGATIVE Final   Influenza B by PCR NEGATIVE NEGATIVE Final    Comment: (NOTE) The Xpert Xpress SARS-CoV-2/FLU/RSV assay is intended as an aid in  the diagnosis of influenza from Nasopharyngeal swab specimens and  should not be used as a sole basis for treatment. Nasal washings and  aspirates are unacceptable for Xpert Xpress SARS-CoV-2/FLU/RSV  testing.  Fact Sheet for Patients: PinkCheek.be  Fact Sheet for Healthcare  Providers: GravelBags.it  This test is not yet approved or cleared by the [place] FDA and  has been authorized for detection and/or diagnosis of SARS-CoV-2 by  FDA under an Emergency Use Authorization (EUA). This EUA will remain  in effect (meaning this test can be used) for the duration of the  Covid-19 declaration under Section 564(b)(1) of the Act, 21  U.S.C. section 360bbb-3(b)(1), unless the authorization is  terminated or revoked. Performed at [facility], [place] [address] [place], [place] [address]      Discharge Instructions:   Discharge Instructions    Ambulatory referral to Neurology   Complete by: As directed    An appointment is requested in approximately: 6 weeks   Diet - low sodium heart healthy   Complete by: As directed    Discharge instructions   Complete by: As directed    Would avoid driving until comfortable with visual deficit   Increase activity slowly   Complete by: As directed      Allergies as of 03/11/2020      Reactions   Ace Inhibitors Other (See Comments)   COUGH   Chocolate    Headaches   Doxycycline    Nausea and abd pain   Gabapentin    Dizziness   Hycodan [hydrocodone-homatropine] Nausea And Vomiting   Oxycodone Nausea Only   Pt states that he cannot take prescription pain medication   Zocor [simvastatin] Other (See Comments)   MYALGIA   Cephalexin Rash      Medication List    TAKE these medications   acetaminophen 325 MG tablet Commonly known as: TYLENOL Take 650 mg by mouth once as needed for moderate pain or headache.   apixaban 5 MG Tabs tablet Commonly known as: Eliquis Take 1 tablet (5 mg total) by mouth 2 (two) times daily.   aspirin EC 81 MG tablet Take 1 tablet (81 mg total) by mouth daily. Swallow whole.   butalbital-acetaminophen-caffeine 50-325-40 MG tablet Commonly known as: FIORICET Take 1 tablet by mouth 3 (three) times daily as  needed for migraine.    cholecalciferol 1000 units tablet Commonly known as: VITAMIN D Take 1,000 Units by mouth daily.   diltiazem 120 MG 24 hr capsule Commonly known as: CARDIZEM CD Take 1 capsule (120 mg) by mouth once daily   fluticasone 50 MCG/ACT nasal spray Commonly known as: FLONASE Place 1 spray into both nostrils daily. What changed: how much to take   hydrochlorothiazide 12.5 MG tablet Commonly known as: HYDRODIURIL TAKE 1 TABLET BY MOUTH EACH MORNING Start taking on: March 13, 2020 What changed:   medication strength  These instructions start on March 13, 2020. If you are unsure what to do until then, ask your doctor or other care provider.   loratadine 10 MG tablet Commonly known as: CLARITIN Take 10 mg by mouth daily.   meclizine 25 MG tablet Commonly known as: ANTIVERT Take 1 tablet (25 mg total) by mouth 2 (two) times daily as needed for dizziness.   melatonin 3 MG Tabs tablet Take 3 mg by mouth at bedtime as needed (sleep).   potassium chloride 10 MEQ tablet Commonly known as: KLOR-CON Take 10 mEq by mouth every morning. Take as directed   pravastatin 80 MG tablet Commonly known as: PRAVACHOL Take 0.5 tablets (40 mg total) by mouth daily.   SYSTANE ULTRA OP Apply 1-2 drops to eye once as needed (dry eyes.).       Follow-up Information    Tonia Ghent, MD Follow up in 1 week(s).   Specialty: Family Medicine Contact information: [place] [address] [phone]        Deboraha Sprang, MD .   Specialty: Cardiology Contact information: [phone] [address] [place] [address] [phone]        Serafina Mitchell, MD Follow up.   Specialties: Vascular Surgery, Cardiology Contact information: [place]  [address] [phone]                Time coordinating discharge: 35 min  Signed:  Geradine Girt DO  [facility] 03/11/2020, 4:18 PM

## 2020-03-11 NOTE — Evaluation (Addendum)
Physical Therapy Evaluation Patient Details Name: [name] [name] MRN: [id number] DOB: 26-Nov-1934 Today's Date: 03/11/2020   History of Present Illness  Pt is a 85 y.o. 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 Pt was watching tv and noticed that he is having difficulty focusing on the top part of the screen.  When he shut his eye, he could not see the top half of things from his left eye.  He went to his ophthalmologist and recommended to go to hospital for stroke work up. .Not a candidate for IV TPA due to being on Eliquis although still was within 24 hours of last known well at presentation .Reports an episode of dizziness sometime about a month ago which resolved on its own.No prior history of stroke.  Does not report palpitations with his paroxysmal atrial fibrillation.Has had atrial fibrillation since the 1980s. Pt's past medical history of paroxysmal atrial fibrillation on Eliquis, CAD, hypertension, hyperlipidemia.   Clinical Impression  Pt admitted with above diagnosis. Pt states he was I prior to admission. Pt was alert and oriented x 4 during evaluation. Pt with upper visual field cut on L eye. Pt performed bed mobility and transfers mod I during evaluation. Evaluation limited due to elevated BP (LUE supine: 188/102 and 171/102; RUE supine 167/102; sitting RUE 181/105) NP and RN notified. Pt performed standing and side stepping to reposition in bed to improve posture and alignment, pt performed mod I. Pt demonstrates mild safety concerns due to history of dizziness as well as new visual deficits. Pt educated on scanning environment to safely negotiate environment, pt verbalized understanding. Pt educated on BEFAST to detect possible s/s of stroke, pt given handout and verbalized understanding. Pt would benefit from additional skilled PT to assess longer gait distances with safety with obstacle negotiation as well as stair  negotiation with new visual deficit to ensure safety with functional mobility prior to discharge.     Follow Up Recommendations No PT follow up    Equipment Recommendations  None recommended by PT    Recommendations for Other Services       Precautions / Restrictions Precautions Precautions: Fall Precaution Comments: L visual deficits Monitor BP      Mobility  Bed Mobility Overal bed mobility: Modified Independent             General bed mobility comments: pt states he always sits EOB for a few minutes prior to standing    Transfers Overall transfer level: Modified independent Equipment used: None             General transfer comment: education to stand for a few minutes by the bed prior to ambulating  Ambulation/Gait             General Gait Details: unable due to increase in BP  Stairs            Wheelchair Mobility    Modified Rankin (Stroke Patients Only) Modified Rankin (Stroke Patients Only) Pre-Morbid Rankin Score: No symptoms Modified Rankin: No significant disability     Balance Overall balance assessment: Mild deficits observed, not formally tested                                           Pertinent Vitals/Pain Pain Assessment: No/denies pain    Home Living Family/patient expects to be discharged to:: Private residence  Living Arrangements: Spouse/significant other Available Help at Discharge: Family;Available 24 hours/day Type of Home: House Home Access: Stairs to enter   [employer] of Steps: 1 Home Layout: Multi-level Home Equipment: None Additional Comments: driving , normal household chores was blowing leaves at times of event,     Prior Function Level of Independence: Independent               Hand Dominance   Dominant Hand: Left    Extremity/Trunk Assessment   Upper Extremity Assessment Upper Extremity Assessment: Overall WFL for tasks assessed    Lower Extremity  Assessment Lower Extremity Assessment: Overall WFL for tasks assessed    Cervical / Trunk Assessment Cervical / Trunk Assessment: Normal  Communication   Communication: No difficulties  Cognition Arousal/Alertness: Awake/alert Behavior During Therapy: WFL for tasks assessed/performed Overall Cognitive Status: Within Functional Limits for tasks assessed                                 General Comments: pt requiring redirection to attend to task, pt with increased details when answering questions      General Comments General comments (skin integrity, edema, etc.): quick visual field assessment: R eye WFL L eye upper field loss; increaed education given regarding scanning environment due to field cut, education to have wife present when performing stairs initiallly due to visual changes; educated on BEFAST for s/s of stroke    Exercises     Assessment/Plan    PT Assessment Patient needs continued PT services  PT Problem List Decreased balance       PT Treatment Interventions Stair training;Gait training    PT Goals (Current goals can be found in the Care Plan section)  Acute Rehab PT Goals Patient Stated Goal: I would like to go home PT Goal Formulation: With patient Time For Goal Achievement: 03/25/20 Potential to Achieve Goals: Good    Frequency Min 3X/week   Barriers to discharge        Co-evaluation               AM-PAC PT "6 Clicks" Mobility  Outcome Measure Help needed turning from your back to your side while in a flat bed without using bedrails?: None Help needed moving from lying on your back to sitting on the side of a flat bed without using bedrails?: None Help needed moving to and from a bed to a chair (including a wheelchair)?: None Help needed standing up from a chair using your arms (e.g., wheelchair or bedside chair)?: None Help needed to walk in hospital room?: None Help needed climbing 3-5 steps with a railing? : A Little 6  Click Score: 23    End of Session Equipment Utilized During Treatment: Gait belt Activity Tolerance: Patient tolerated treatment well Patient left: in bed;with bed alarm set;with call bell/phone within reach Nurse Communication: Mobility status PT Visit Diagnosis: Unsteadiness on feet (R26.81)    Time: 0938-1829 PT Time Calculation (min) (ACUTE ONLY): 28 min   Charges:   PT Evaluation $PT Eval Low Complexity: 1 Low PT Treatments $Therapeutic Activity: 8-22 mins        Lyanne Co, DPT Acute Rehabilitation Services [phone]  [place] 03/11/2020, 11:56 AM

## 2020-03-11 NOTE — Consult Note (Signed)
[facility] [place]  Patient name: [name] [name] MRN: [id number] DOB: 1934/10/27 Sex: male   REQUESTING PROVIDER:    DR. Xu   REASON FOR CONSULT:    Left retinal artery occlusion  HISTORY OF PRESENT ILLNESS:   [name] [name] is a 85 y.o. male, who yesterday developed left eye vision loss.  He saw his ophthalmologist who diagnosed left retinal artery occlusion.  The patient was sent to the hospital and admitted.  He had a CT angiogram that showed soft and calcific plaque at his left carotid bifurcation stenosis by radiology was less than 50% however this does likely appear to be the source of his event.  He had an echo that was negative for intracardiac source.  His ejection fraction was 55-60%.  He has not had any other neurologic symptoms.  He did not receive TPA since he was on Eliquis.  His vision is slightly better but still has some deficits  Patient has a history of atrial fibrillation.  He takes Eliquis.  He is on a statin for hypercholesterolemia.  His LDL is 85.  He has a history of coronary artery disease, status post CABG.  PAST MEDICAL HISTORY    Past Medical History:  Diagnosis Date  . Anticoagulated on Coumadin    followed in [facility] at [facility]  . Aortic stenosis    a. Echo 2/15: Severe LVH, EF 55-60%, Gr 1 DD, mild to mod AS (mean 20 mmHg, peak 31 mmHg), AVA 1.4-1.5 cm2, mild AI, MAC, trivial MR, LA upper limits of normal, RVSP 22 mmHg, mild RAE;   b. Echo 2/16:  EF 55-60%, no RWMA, mild AS (mean 17 mmHg, peak 27 mmHg), AVA 1.5 cm2, mild AI, MAC, mild LAE  . Arthritis   . Bladder neoplasm   . Chronic cough    NO CARDIAC OR PULMONARY RELATED  . Coronary artery disease CARDIOLOGIST-  DR KLEIN/ [facility]   a. s/p CABG 2004, b. LHC with patent grafts 07/2005, ejection fraction 50%;  c. Myoview 2/15: no ischemia, EF 61%  . Dry eyes   . Dyslipidemia   . GERD (gastroesophageal reflux disease)   . HTN  (hypertension)   . Mild aortic stenosis   . PAF (paroxysmal atrial fibrillation) ([place])    CHADS2-VASc:  4  . Paroxysmal atrial fibrillation (HCC)   . 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 Amiodarone Rx  . S/P CABG x 5 2004     FAMILY HISTORY   Family History  Problem Relation Age of Onset  . Cancer Mother     SOCIAL HISTORY:   Social History   Socioeconomic History  . Marital status: Married    Spouse name: Not on file  . Number of children: 3  . Years of education: Not on file  . Highest education level: Not on file  Occupational History  . Occupation: Retired  Tobacco Use  . Smoking status: Former Smoker    Packs/day: 1.00    Years: 25.00    Pack years: 25.00    Types: Cigarettes    Quit date: 04/25/1980    Years since quitting: 39.9  . Smokeless tobacco: Never Used  Substance and Sexual Activity  . Alcohol use: No    Alcohol/week: 0.0 standard drinks  . Drug use: No  . Sexual activity: Not on file  Other Topics Concern  . Not on file  Social History Narrative   [employer]-'58, overseas to  [place]   Some care through [place]   No service disability   Social Determinants of Health   Financial Resource Strain: Low Risk   . Difficulty of Paying Living Expenses: Not hard at all  Food Insecurity: No Food Insecurity  . Worried About Charity fundraiser in the Last Year: Never true  . Ran Out of Food in the Last Year: Never true  Transportation Needs: No Transportation Needs  . Lack of Transportation (Medical): No  . Lack of Transportation (Non-Medical): No  Physical Activity: Inactive  . Days of Exercise per Week: 0 days  . Minutes of Exercise per Session: 0 min  Stress: No Stress Concern Present  . Feeling of Stress : Not at all  Social Connections:   . Frequency of Communication with Friends and Family: Not on file  . Frequency of Social Gatherings with Friends and Family: Not on file  . Attends Religious Services: Not  on file  . Active Member of Clubs or Organizations: Not on file  . Attends Archivist Meetings: Not on file  . Marital Status: Not on file  Intimate Partner Violence: Not At Risk  . Fear of Current or Ex-Partner: No  . Emotionally Abused: No  . Physically Abused: No  . Sexually Abused: No    ALLERGIES:    Allergies  Allergen Reactions  . Ace Inhibitors Other (See Comments)    COUGH  . Chocolate     Headaches   . Doxycycline     Nausea and abd pain  . Gabapentin     Dizziness  . Hycodan [Hydrocodone-Homatropine] Nausea And Vomiting  . Oxycodone Nausea Only    Pt states that he cannot take prescription pain medication  . Zocor [Simvastatin] Other (See Comments)    MYALGIA  . Cephalexin Rash    CURRENT MEDICATIONS:    Current Facility-Administered Medications  Medication Dose Route Frequency Provider Last Rate Last Admin  . acetaminophen (TYLENOL) tablet 650 mg  650 mg Oral Q4H PRN Opyd, Ilene Qua, MD       Or  . acetaminophen (TYLENOL) 160 MG/5ML solution 650 mg  650 mg Per Tube Q4H PRN Opyd, Ilene Qua, MD       Or  . acetaminophen (TYLENOL) suppository 650 mg  650 mg Rectal Q4H PRN Opyd, Ilene Qua, MD      . aspirin tablet 325 mg  325 mg Oral Daily Opyd, Ilene Qua, MD   325 mg at 03/11/20 0900  . diltiazem (CARDIZEM CD) 24 hr capsule 120 mg  120 mg Oral Daily Opyd, Ilene Qua, MD   120 mg at 03/11/20 0900  . labetalol (NORMODYNE) injection 10 mg  10 mg Intravenous Q2H PRN Opyd, Ilene Qua, MD      . melatonin tablet 3 mg  3 mg Oral QHS Opyd, Ilene Qua, MD   3 mg at 03/10/20 2354  . pravastatin (PRAVACHOL) tablet 40 mg  40 mg Oral QPC supper Opyd, Ilene Qua, MD   40 mg at 03/10/20 2354  . senna-docusate (Senokot-S) tablet 1 tablet  1 tablet Oral QHS PRN Opyd, Ilene Qua, MD        REVIEW OF SYSTEMS:   _0  denotes positive finding, _1  denotes negative finding Cardiac  Comments:  Chest pain or chest pressure:    Shortness of breath upon exertion:    Short  of breath when lying flat:    Irregular heart rhythm: x       Vascular  Pain in calf, thigh, or hip brought on by ambulation:    Pain in feet at night that wakes you up from your sleep:     Blood clot in your veins:    Leg swelling:         Pulmonary    Oxygen at home:    Productive cough:     Wheezing:         Neurologic    Sudden weakness in arms or legs:     Sudden numbness in arms or legs:     Sudden onset of difficulty speaking or slurred speech:    Temporary loss of vision in one eye:  x   Problems with dizziness:         Gastrointestinal    Blood in stool:      Vomited blood:         Genitourinary    Burning when urinating:     Blood in urine:        Psychiatric    Major depression:         Hematologic    Bleeding problems:    Problems with blood clotting too easily:        Skin    Rashes or ulcers:        Constitutional    Fever or chills:     PHYSICAL EXAM:   Vitals:   03/11/20 0500 03/11/20 0700 03/11/20 1124 03/11/20 1147  BP: 137/79 (!) 141/92 (!) 153/87   Pulse: 78 90 87 94  Resp: 14 (!) 21    Temp: 98.1 F (36.7 C) 98.3 F (36.8 C) 98.1 F (36.7 C)   TempSrc: Oral Oral    SpO2: 99%  97% 96%  Weight:      Height:        GENERAL: The patient is a well-nourished male, in no acute distress. The vital signs are documented above. CARDIAC: There is a regular rate and rhythm.  PULMONARY: Nonlabored respirations ABDOMEN: Soft and non-tender with normal pitched bowel sounds.  MUSCULOSKELETAL: There are no major deformities or cyanosis. NEUROLOGIC: No focal weakness or paresthesias are detected. SKIN: There are no ulcers or rashes noted. PSYCHIATRIC: The patient has a normal affect.  STUDIES:   I have reviewed his CT scan with the following findings: No acute intracranial abnormality. Moderate chronic microvascular ischemic changes.  Calcified plaque at the left greater than right ICA origins without hemodynamically significant  stenosis.  No proximal intracranial vessel occlusion or significant stenosis. ASSESSMENT and PLAN   Left retinal artery occlusion: I discussed with the patient that the etiology of this is either his atrial fibrillation or stenosis within his carotid artery.  After discussions with the stroke service and reviewing his CT scan, I feel that the most likely explanation is his carotid stenosis on the left which has calcific plaque and some soft plaque at the bifurcation.  On my estimate, I feel his stenosis is slightly greater than 50%.  I discussed the treatment options including medical management, carotid endarterectomy, or carotid stenting.  Because of the tortuous nature of his carotid artery, I feel like the best option is carotid endarterectomy.  The patient would like to go home this evening which I think would be appropriate.  We can schedule him for carotid endarterectomy in the near future.  He will need to be off of his Eliquis for 48 hours prior to his surgery.   Leia Alf, MD, FACS [facility]  Tel [phone] Pager [phone]

## 2020-03-12 DIAGNOSIS — Z961 Presence of intraocular lens: Secondary | ICD-10-CM | POA: Diagnosis not present

## 2020-03-12 DIAGNOSIS — H43813 Vitreous degeneration, bilateral: Secondary | ICD-10-CM | POA: Diagnosis not present

## 2020-03-12 DIAGNOSIS — H16223 Keratoconjunctivitis sicca, not specified as Sjogren's, bilateral: Secondary | ICD-10-CM | POA: Diagnosis not present

## 2020-03-12 DIAGNOSIS — H0102B Squamous blepharitis left eye, upper and lower eyelids: Secondary | ICD-10-CM | POA: Diagnosis not present

## 2020-03-12 DIAGNOSIS — H34212 Partial retinal artery occlusion, left eye: Secondary | ICD-10-CM | POA: Diagnosis not present

## 2020-03-12 DIAGNOSIS — H0102A Squamous blepharitis right eye, upper and lower eyelids: Secondary | ICD-10-CM | POA: Diagnosis not present

## 2020-03-12 DIAGNOSIS — H35373 Puckering of macula, bilateral: Secondary | ICD-10-CM | POA: Diagnosis not present

## 2020-03-13 ENCOUNTER — Telehealth: Payer: Self-pay

## 2020-03-13 NOTE — Telephone Encounter (Signed)
Patient called to request stroke preventative medications while he decides about surgery. Advised he was already on standard Eliquis, ASA and a statin. He verbalized understanding.

## 2020-03-17 ENCOUNTER — Ambulatory Visit (INDEPENDENT_AMBULATORY_CARE_PROVIDER_SITE_OTHER): Payer: Medicare Other | Admitting: Family Medicine

## 2020-03-17 ENCOUNTER — Other Ambulatory Visit: Payer: Self-pay

## 2020-03-17 DIAGNOSIS — H349 Unspecified retinal vascular occlusion: Secondary | ICD-10-CM | POA: Diagnosis not present

## 2020-03-17 DIAGNOSIS — I2510 Atherosclerotic heart disease of native coronary artery without angina pectoris: Secondary | ICD-10-CM

## 2020-03-17 DIAGNOSIS — H43813 Vitreous degeneration, bilateral: Secondary | ICD-10-CM | POA: Diagnosis not present

## 2020-03-17 DIAGNOSIS — H5319 Other subjective visual disturbances: Secondary | ICD-10-CM | POA: Diagnosis not present

## 2020-03-17 DIAGNOSIS — H34212 Partial retinal artery occlusion, left eye: Secondary | ICD-10-CM | POA: Diagnosis not present

## 2020-03-17 DIAGNOSIS — H16223 Keratoconjunctivitis sicca, not specified as Sjogren's, bilateral: Secondary | ICD-10-CM | POA: Diagnosis not present

## 2020-03-17 DIAGNOSIS — H0102A Squamous blepharitis right eye, upper and lower eyelids: Secondary | ICD-10-CM | POA: Diagnosis not present

## 2020-03-17 DIAGNOSIS — Z961 Presence of intraocular lens: Secondary | ICD-10-CM | POA: Diagnosis not present

## 2020-03-17 DIAGNOSIS — H35373 Puckering of macula, bilateral: Secondary | ICD-10-CM | POA: Diagnosis not present

## 2020-03-17 DIAGNOSIS — H0102B Squamous blepharitis left eye, upper and lower eyelids: Secondary | ICD-10-CM | POA: Diagnosis not present

## 2020-03-17 MED ORDER — HYDROCHLOROTHIAZIDE 12.5 MG PO TABS
ORAL_TABLET | ORAL | 3 refills | Status: DC
Start: 2020-03-17 — End: 2020-07-27

## 2020-03-17 MED ORDER — PRAVASTATIN SODIUM 80 MG PO TABS
80.0000 mg | ORAL_TABLET | Freq: Every day | ORAL | Status: DC
Start: 2020-03-17 — End: 2020-08-07

## 2020-03-17 NOTE — Patient Instructions (Addendum)
Go back to 80mg  pravastatin.  Let me know if you don't tolerate that.  Take care.  Glad to see you. If you have changes in vision or new stroke symptoms in the meantime, then go to the ER.   I think it makes sense to follow up with the surgery clinic. Please call them.

## 2020-03-17 NOTE — Progress Notes (Signed)
This visit occurred during the SARS-CoV-2 public health emergency.  Safety protocols were in place, including screening questions prior to the visit, additional usage of staff PPE, and extensive cleaning of exam room while observing appropriate contact time as indicated for disinfecting solutions.  Recent events discussed with patient.  He had a vision change, quick onset.  ER eval.  L eye superior vision is affected.  He was seen by vascular surgery.  The plan is for close follow up with vascular for CEA: will need to be off of his Eliquis for 48 hours prior to his surgery.  Still with occ vertigo, noted in the AMs.  Unclear if this is related to the above.  Brief sx.    He has cardiology f/u pending.  He had occ upper abd "jittery feeling".  That is separate from the balance changes above.    He was asking about taking aspirin at night and that should be okay.  D/w pt.    He has had some gradual weight changes and we agreed to monitor this in the meantime.  He can let me know if he has progressive weight loss.  He does not have obvious night sweats or hemoptysis or alarming symptoms otherwise.  Meds, vitals, and allergies reviewed.   ROS: Per HPI unless specifically indicated in ROS section   GEN: nad, alert and oriented HEENT: ncat NECK: supple w/o LA CV: rrr PULM: ctab, no inc wob ABD: soft, +bs EXT: no edema SKIN: no acute rash CN 2-12 wnl B, S/S/DTR wnl x4 except for subjective vision loss in the left eye that is now an old finding.

## 2020-03-18 NOTE — Assessment & Plan Note (Signed)
I think it makes sense to go back to 80 mg of pravastatin and have him follow-up with the surgery clinic for consideration of CEA.  That is likely the most effective intervention at this point.  Rationale discussed with patient.  He understood.  It does not appear that his vertigo symptoms are related to his vision change.  I think it makes sense to monitor that for now.  The same likely helps with the upper abdominal "jittery feeling" that he has.  He has a benign abdominal exam today and I think it makes sense to observe this for now.  He was asking about taking aspirin at night instead of in the morning and this should be fine.  If he has progressive weight loss he will let me know.  At this point still okay for outpatient follow-up.  I appreciate the help of all involved.

## 2020-03-24 DIAGNOSIS — Z23 Encounter for immunization: Secondary | ICD-10-CM | POA: Diagnosis not present

## 2020-03-27 DIAGNOSIS — R2689 Other abnormalities of gait and mobility: Secondary | ICD-10-CM | POA: Diagnosis not present

## 2020-03-27 DIAGNOSIS — K219 Gastro-esophageal reflux disease without esophagitis: Secondary | ICD-10-CM | POA: Diagnosis not present

## 2020-03-27 DIAGNOSIS — R42 Dizziness and giddiness: Secondary | ICD-10-CM | POA: Insufficient documentation

## 2020-03-29 NOTE — Progress Notes (Unsigned)
Cardiology Office Note Date:  03/31/2020  Patient ID:  [name], [name] [birth year], MRN [id number] PCP:  Tonia Ghent, MD  Cardiologist:  Dr. Caryl Comes   Chief Complaint:  post hospital  History of Present Illness: [name] [name] is a 85 y.o. male with history of CAD (remote CABG), mild AS, VT (RVOT tx with amiodarone), PAFib, syncope (pust micturition), LBBB  He last saw Dr. Caryl Comes April 2019, he was doing well, no symptoms, no syncope.  No changes made.  Most recently saw A. Lynnell Jude, NP for pre-colonoscopy clearance. Doing well.  I saw him may 2021 He feels well.  No exertional intolerances, no formal exercise but gets out I the yard and so on.  NO CP, palpitations, no SOB, DOE.  No symptoms of PND or orthopnea.  NO dizzy spells, near syncope or syncope. No bleeding or signs of bleeding. Labs were updated and stable for his Eliquis, appropriately dosed Echo was updated w/LVEF 60-65%, mild conc LVH Mod reduced RV function (noted in 2017 as well), normal pulm pressures, mild MR, mild AI, mod AS (mead gradient 23) Ascending Ao mildly dilated 64m   I saw him 02/11/20 Patient called [place] PM[place] office yesterday with dizziness, perhaps double vision/blurred vision and reports of 170/100 (prior to his HCTZ and dilt) it seems.  [facility], he was also given an appt to be seen here by our office. He reported compliance with his eliquis.  He mentions a number of symptoms/observations:  1. For a couple of week he has had some dizziness. This is very particularly when standing from a bent over or stooped position especially. He gives an example that a few days ago, last week was doing quite a lot of yard work, cutting back bushes and cleaning out debris from under them, a lot of raking and clearing.  When he would stand up from being under the bushes and clearing things he would feel a bit dizzy or off balance for a few moments. He  does say that for years, when he first gets up in the morning or after seated for a long time he will sit up for a 30 seconds or so with h/u of getting a lilttle lightheaded with standing. August he mentioned this awas a little more then usual yo his PMD and his HCTZ was reduced to 1/2 tab daily (12.596m This dizziness perhaps a bit different with more of an off balance or motion sensation then overtly lightheaded. He does NOT feel like he is going to faint   He reports 100% compliance with his Eliquis, no missed doses, he chronically has headaches, none unusual or particularly bad/worse then ususal  2. Sunday he woke in the middle of the night as usual for the bathroom, sat on the edge of the bed and felt like he was having a hard timie focusing on the clock numbers (lighted digital) and took a while to clear up.  He went to the bathroom and back to bed without difficulty, though felt a little like he was unbalanced or "off" and perhaps a little heavy in his belly without overt nauseous once back to bed. He did not feel like he was stumblig or having gait difficulty  3. He has noticed of late when seated in his recliner he is napping more then usual, but does not feel overtly fatigued in any way otherwise  4. He has been using  psyllium for his constipation and dizziness is mentioned so he has reduced it to QOD with some improvement in his symptoms.  5. Yesterday AM his BP unusually elevated       171/100,99bpm      156/100, 105bpm (after meds)       Afternoon 131/76, 76bpm and feeling better       He resumed his full tab of HCTZ     TODAY 149/88, 74bpm                   1156/79, 84bpm (after medicines)   I suspected more of a vertigo sounding constellation of symptoms and planned to try meclizine and advised him to follow up with his PMD for any further evaluation neuro w/u, imaging etc. His BP cuff was checked felt to be accurate.  He subsequently saw his PMD, reported he did not think  he needed neuro w/u unless recurrent but his [facility] seemed more worried and did order for him to wear a cardiac monitor.  Telehealth visit 03/09/20 He reports feeling ovr all improved. He has noticed that morning is when he notes that he feels a bit "off", or dizzy, perhaps a jittery feeling, or unsteady a few hours into his day he starts to feel better and remains well again until AM time. No near syncope or syncope, no falls. No CP, palpitations He completed the 2 week monitor for [facility] and returned it Friday last week. He tried 4-6 doses of the meclizine and did no think it made any difference. He has taken his BP and HR BID at home since his last visit ALL sound quite good, generally 120's-130's/80's-90 and HRs 60's-90's He has not had any recurrent visual changes He sees ENT Dec 3rd No changes were made, seemed to have narrowed his symptoms to mornings particularly, was going to f/u with the [facility] MD on his monitor findings and make [place] aware  03/10/20 he was sent to the ER by his eye MD and admitted to the ER with L eye blurred vision  Neurology noted: Ophthalmologist saw [name] plaque in the branch of the retinal artery.  CTA head and neck no high-grade stenosis, however left ICA bulb calcified plaque with soft plaque versus plaque ulceration, indicating high risk plaque.  MRI no acute infarct.  EF 55 to 60%.  LDL 85 and A1c 5.6. Etiology of patient's symptoms likely due to left ICA unstable plaque without high-grade stenosis.  Dr. Trula Slade from vascular surgery saw patient and agree with high risk plaque, plan to have left CEA in the near future.  Recommend to continue Eliquis and also add on aspirin 81.  Continue pravastatin.   TODAY He is doing OK.  It a bit anxious and nervous about the prospect of having the vascular procedure, at the same time, wishes he could get in to see Dr. Trula Slade sooner and discuss further, and if the procedure is decided upon to go ahead and get it behind him.  He has not had any recurrent symptoms.Remains with L eye visual deficit. No CP, palpitations, no SOB or DOE. Is quite active, yard work and so on without difficulties. He still feels in the morning a little "off" though clears and feels well otherwise. No near syncope or syncope. He is on ASA and Eliquis now, no bleeding or signs of bleeding.   AF history: Initial diagnosis 1982 Amiodarone +/- 7 years (also with h/o RVOT VT) stopped June 2018 >> permanent AF  Past Medical History:  Diagnosis Date  . Anticoagulated on Coumadin    follow[facility] in [facility] at [facility]  . Aortic stenosis    a. Echo 2/15: Severe LVH, EF 55-60%, Gr 1 DD, mild to mod AS (mean 20 mmHg, peak 31 mmHg), AVA 1.4-1.5 cm2, mild AI, MAC, trivial MR, LA upper limits of normal, RVSP 22 mmHg, mild RAE;   b. Echo 2/16:  EF 55-60%, no RWMA, mild AS (mean 17 mmHg, peak 27 mmHg), AVA 1.5 cm2, mild AI, MAC, mild LAE  . Arthritis   . Bladder neoplasm   . Chronic cough    NO CARDIAC OR PULMONARY RELATED  . Coronary artery disease CARDIOLOGIST-  DR KLEIN/ [facility]   a. s/p CABG 2004, b. LHC with patent grafts 07/2005, ejection fraction 50%;  c. Myoview 2/15: no ischemia, EF 61%  . Dry eyes   . Dyslipidemia   . GERD (gastroesophageal reflux disease)   . HTN (hypertension)   . Mild aortic stenosis   . PAF (paroxysmal atrial fibrillation) ([place])    CHADS2-VASc:  4  . Paroxysmal atrial fibrillation (HCC)   . 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 Amiodarone Rx  . S/P CABG x 5 2004    Past Surgical History:  Procedure Laterality Date  . CARDIAC CATHETERIZATION  07-26-2005  DR GAMBLE   PRESERVED LVF/  EF 50%/ PATENT GRAFTS  . CARDIAC CATHETERIZATION  03-04-2003  DR GAMBLE   SEVERE 3 VESSEL DISEASE  . [place]   PAF/ FALSE POSITIVE STRESS TEST  . CATARACT EXTRACTION W/ INTRAOCULAR LENS IMPLANT Right   . CORONARY ARTERY BYPASS GRAFT  03-08-2003  DR  [id number]   LIMA TO LAD/ SVG TO OM2/  SVG TO DIAGONAL / SVG TO PDA & PLA  . CYSTOSCOPY WITH BIOPSY N/A 12/25/2012   Procedure: CYSTOSCOPY WITH BLADDER BIOPSY;  Surgeon: Fredricka Bonine, MD;  Location: [facility];  Service: Urology;  Laterality: N/A;  . ELECTROPHYSIOLOGY STUDY  07-27-2005  DR Carleene Overlie TAYLOR   MAPPING --   RESULT NONINDUCIBLE VT OR SVT/  DX RIGHT VENTRICULAR OUTFLOW TRACT VT AND RULES OUT MORE MALIGNANT CAUSES OF VT  . INGUINAL HERNIA REPAIR Bilateral [place]  . KNEE ARTHROSCOPY Right 1994  . LUMBAR DISC SURGERY  1999   L4 -- L5  . MOHS SURGERY     by Dr. Levada Dy 2019  . REMOVAL VOCAL CORD POLYPS  1978  . TONSILLECTOMY  AS CHILD  . TRANSTHORACIC ECHOCARDIOGRAM  08-08-2011   MILD LVH/  EF 23-30%/  GRADE I DIASTOLIC DYSFUNCTION/ MILD AV STENOSIS    Current Outpatient Medications  Medication Sig Dispense Refill  . acetaminophen (TYLENOL) 325 MG tablet Take 650 mg by mouth once as needed for moderate pain or headache.    Marland Kitchen apixaban (ELIQUIS) 5 MG TABS tablet Take 1 tablet (5 mg total) by mouth 2 (two) times daily. 180 tablet 3  . aspirin EC 81 MG tablet Take 1 tablet (81 mg total) by mouth daily. Swallow whole. 150 tablet 2  . butalbital-acetaminophen-caffeine (FIORICET, ESGIC) 50-325-40 MG per tablet Take 1 tablet by mouth 3 (three) times daily as needed for migraine.     . cholecalciferol (VITAMIN D) 1000 UNITS tablet Take 1,000 Units by mouth daily.    Marland Kitchen diltiazem (CARDIZEM CD) 120 MG 24 hr capsule Take 1 capsule (120 mg) by mouth once daily    . fluticasone (  FLONASE) 50 MCG/ACT nasal spray Place 1 spray into both nostrils daily. 16 g 1  . hydrochlorothiazide (HYDRODIURIL) 12.5 MG tablet TAKE 1 TABLET BY MOUTH EACH MORNING 90 tablet 3  . loratadine (CLARITIN) 10 MG tablet Take 10 mg by mouth daily.    . meclizine (ANTIVERT) 25 MG tablet Take 1 tablet (25 mg total) by mouth 2 (two) times daily as needed for dizziness. 60 tablet 1  . Melatonin 3 MG  TABS Take 3 mg by mouth at bedtime.     Vladimir Faster Glycol-Propyl Glycol (SYSTANE ULTRA OP) Apply 1-2 drops to eye once as needed (dry eyes.).     Marland Kitchen potassium chloride (K-DUR) 10 MEQ tablet Take 10 mEq by mouth every morning. Take as directed    . pravastatin (PRAVACHOL) 80 MG tablet Take 1 tablet (80 mg total) by mouth daily.     No current facility-administered medications for this visit.    Allergies:   Ace inhibitors, Chocolate, Doxycycline, Gabapentin, Hycodan [hydrocodone-homatropine], Oxycodone, Zocor [simvastatin], and Cephalexin   Social History:  The patient  reports that he quit smoking about 39 years ago. His smoking use included cigarettes. He has a 25.00 pack-year smoking history. He has never used smokeless tobacco. He reports that he does not drink alcohol and does not use drugs.   Family History:  The patient's family history includes Cancer in his mother.  ROS:  Please see the history of present illness.    All other systems are reviewed and otherwise negative.   PHYSICAL EXAM:  VS:  BP (!) 156/90   Pulse 90   Ht _0  (1.778 m)   Wt 195 lb (88.5 kg)   SpO2 97%   BMI 27.98 kg/m  BMI: Body mass index is 27.98 kg/m. Well nourished, well developed, in no acute distress  HEENT: normocephalic, atraumatic  Neck: no JVD, carotid bruits or masses Cardiac:  irreg-irreg, soft SM, no rubs, or gallops Lungs:  CTA b/l, no wheezing, rhonchi or rales  Abd: soft, nontender, MS: no deformity, age appropriate atrophy Ext: no edema, palp pedal pulses b/l Skin: warm and dry, no rash Neuro:  No gross focal deficits are appreciated Psych: euthymic mood, full affect     EKG:  Not done today   Echocardiogram Complete 03/11/20 1. Left ventricular ejection fraction, by estimation, is 55 to 60%. The  left ventricle has normal function. The left ventricle has no regional  wall motion abnormalities. There is mild concentric left ventricular  hypertrophy. Left ventricular  diastolic  parameters are indeterminate.  2. Right ventricular systolic function is moderately reduced. The right  ventricular size is mildly enlarged. There is moderately elevated  pulmonary artery systolic pressure. The estimated right ventricular  systolic pressure is 94.8 mmHg.  3. Left atrial size was moderately dilated.  4. The mitral valve is grossly normal. No evidence of mitral valve  regurgitation. Moderate mitral annular calcification.  5. Tricuspid valve regurgitation is mild to moderate.  6. The aortic valve is calcified. There is severe calcifcation of the  aortic valve. Aortic valve regurgitation is moderate. Mild to moderate  aortic valve stenosis.  7. There is dilatation of the ascending aorta, measuring 42 mm.  8. The inferior vena cava is dilated in size with <50% respiratory  variability, suggesting right atrial pressure of 15 mmHg.   Comparison(s): A prior study was performed on 09/25/19. Compared to prior:  stable aortic valve regurgitation and stenosis, stable ascending aortic  size, stable RV function, stable mitral  calcifications, slight increase in  tricuspid regurgitation.   He brings his monitor results from the [place], letter is dated 03/24/20 Max HR 200 Min 45 avg 78 V ectopy: isolated <1% Couplet <1% Triplet <1% One episode 4 beats, max rate 200 Impression Underlying rhythm is atrial fibrillatin with good heart rate control as detailed above No high-grade AV block or prolonged pauses Triggered events correspond to rate controlled atrial fibrillation with isolated PVCs  09/25/2019: TTE IMPRESSIONS  1. Left ventricular ejection fraction, by estimation, is 60 to 65%. The  left ventricle has normal function. The left ventricle has no regional  wall motion abnormalities. There is mild concentric left ventricular  hypertrophy. Left ventricular diastolic  parameters are indeterminate.  2. Right ventricular systolic function is moderately reduced.  The right  ventricular size is mildly enlarged. There is normal pulmonary artery  systolic pressure. The estimated right ventricular systolic pressure is  50.3 mmHg.  3. The mitral valve is normal in structure. Mild mitral valve  regurgitation. No evidence of mitral stenosis.  4. The aortic valve is tricuspid. Aortic valve regurgitation is mild to  moderate. Moderate aortic valve stenosis. Aortic valve area, by VTI  measures 1.19 cm. Aortic valve mean gradient measures 23.0 mmHg. Aortic  valve Vmax measures 2.99 m/s.  5. Aortic dilatation noted. There is mild dilatation of the ascending  aorta measuring 43 mm.  6. The inferior vena cava is normal in size with greater than 50%  respiratory variability, suggesting right atrial pressure of 3 mmHg.  7. Compared to study of 10/19/2016, the mean AVG has increased from 14mHg  to 229mg, DI decreased from 0.29 to 0.25, AVA has remained about the  same.    10/19/2016: TTE Study Conclusions  - Left ventricle: Wall thickness was increased in a pattern of mild  LVH. Systolic function was normal. The estimated ejection  fraction was in the range of 55% to 60%. Left ventricular  diastolic function parameters were normal.  - Aortic valve: MIld to moderate AS gradients stable since October  2017 There was mild to moderate stenosis. There was mild  regurgitation. Valve area (VTI): 1.02 cm^2. Valve area (Vmax):  0.98 cm^2. Valve area (Vmean): 0.89 cm^2.  - Mitral valve: Calcified annulus.  - Atrial septum: No defect or patent foramen ovale was identified.    2/5/20915: stress myoview Impression Exercise Capacity:  Fair exercise capacity. BP Response:  Normal blood pressure response. Clinical Symptoms:  No chest pain or dyspnea. ECG Impression:  No significant ST segment change suggestive of ischemia. Comparison with Prior Nuclear Study: No images to compare  Overall Impression:  Normal stress nuclear study.  LV Ejection  Fraction: 61%.  LV Wall Motion:  NL LV Function; NL Wall Motion   Recent Labs: 12/10/2019: TSH 3.14 03/10/2020: ALT 15; BUN 22; Creatinine, Ser 1.10; Hemoglobin 14.3; Platelets 206; Potassium 3.8; Sodium 141  03/11/2020: Cholesterol 152; HDL 52; LDL Cholesterol 85; Total CHOL/HDL Ratio 2.9; Triglycerides 73; VLDL 15   Estimated Creatinine Clearance: 57 mL/min (by C-G formula based on SCr of 1.1 mg/dL).   Wt Readings from Last 3 Encounters:  03/31/20 195 lb (88.5 kg)  03/17/20 192 lb 8 oz (87.3 kg)  03/11/20 189 lb 2.5 oz (85.8 kg)     Other studies reviewed: Additional studies/records reviewed today include: summarized above  ASSESSMENT AND PLAN:   1. Permanent AFib     CHA2DS2Vasc is at least 4, on Eliquis, appropriately dosed     No palpitations or cardiac  awareness, rate controlled      2. CAD (CABG)     stable without symptoms   3. HTN     Reports much better at home  4. VHD, mild-mod AS     Stable by his echo  5. Syncope     Post mictiration     Not recurrent  6. Dizziness     Still unclear what this is, I have asked him to check his BP in the AM when he notes the symptom     He saw ENT, not felt an inner ear issue     To discuss with neurology further 7. L eye visual deficit     l upper visual field     retinal artery branch occl     soft carotid plaque     vascular follow up scheduled for jan, sees neurology next week  8. Hx of VT     Only one 4 beat episode on his monitor, PVC burden <1%    Disposition:  Will have him see Dr. Caryl Comes at his next available appointment     Current medicines are reviewed at length with the patient today.   Haywood Lasso, PA-C 03/31/2020 9:15 AM     [facility] [address] [place] [address] [phone] (office)  [phone] (fax)

## 2020-03-31 ENCOUNTER — Other Ambulatory Visit: Payer: Self-pay

## 2020-03-31 ENCOUNTER — Ambulatory Visit (INDEPENDENT_AMBULATORY_CARE_PROVIDER_SITE_OTHER): Payer: Medicare Other | Admitting: Physician Assistant

## 2020-03-31 ENCOUNTER — Encounter: Payer: Self-pay | Admitting: Physician Assistant

## 2020-03-31 VITALS — BP 156/90 | HR 90 | Ht 70.0 in | Wt 195.0 lb

## 2020-03-31 DIAGNOSIS — I2510 Atherosclerotic heart disease of native coronary artery without angina pectoris: Secondary | ICD-10-CM

## 2020-03-31 DIAGNOSIS — I472 Ventricular tachycardia, unspecified: Secondary | ICD-10-CM

## 2020-03-31 DIAGNOSIS — I4821 Permanent atrial fibrillation: Secondary | ICD-10-CM | POA: Diagnosis not present

## 2020-03-31 DIAGNOSIS — I10 Essential (primary) hypertension: Secondary | ICD-10-CM | POA: Diagnosis not present

## 2020-03-31 NOTE — Patient Instructions (Signed)
Medication Instructions:   Your physician recommends that you continue on your current medications as directed. Please refer to the Current Medication list given to you today.   *If you need a refill on your cardiac medications before your next appointment, please call your pharmacy*   Lab Work: [place]   If you have labs (blood work) drawn today and your tests are completely normal, you will receive your results only by: Marland Kitchen MyChart Message (if you have MyChart) OR . A paper copy in the mail If you have any lab test that is abnormal or we need to change your treatment, we will call you to review the results.   Testing/Procedures: NONE ORDERED  TODAY     Follow-Up: At [facility], you and your health needs are our priority.  As part of our continuing mission to provide you with exceptional heart care, we have created designated Provider Care Teams.  These Care Teams include your primary Cardiologist (physician) and Advanced Practice Providers (APPs -  Physician Assistants and Nurse Practitioners) who all work together to provide you with the care you need, when you need it.  We recommend signing up for the patient portal called "MyChart".  Sign up information is provided on this After Visit Summary.  MyChart is used to connect with patients for Virtual Visits (Telemedicine).  Patients are able to view lab/test results, encounter notes, upcoming appointments, etc.  Non-urgent messages can be sent to your provider as well.   To learn more about what you can do with MyChart, go to [email or web address].    Your next appointment:  Next available before May (Recall)  The format for your next appointment:   In Person  Provider:   Virl Axe, MD   Other Instructions

## 2020-04-08 ENCOUNTER — Encounter: Payer: Self-pay | Admitting: Adult Health

## 2020-04-08 ENCOUNTER — Telehealth (INDEPENDENT_AMBULATORY_CARE_PROVIDER_SITE_OTHER): Payer: Medicare Other | Admitting: Adult Health

## 2020-04-08 DIAGNOSIS — Z9189 Other specified personal risk factors, not elsewhere classified: Secondary | ICD-10-CM | POA: Diagnosis not present

## 2020-04-08 DIAGNOSIS — I480 Paroxysmal atrial fibrillation: Secondary | ICD-10-CM

## 2020-04-08 DIAGNOSIS — I10 Essential (primary) hypertension: Secondary | ICD-10-CM | POA: Diagnosis not present

## 2020-04-08 DIAGNOSIS — E785 Hyperlipidemia, unspecified: Secondary | ICD-10-CM

## 2020-04-08 DIAGNOSIS — I6522 Occlusion and stenosis of left carotid artery: Secondary | ICD-10-CM | POA: Diagnosis not present

## 2020-04-08 DIAGNOSIS — H34232 Retinal artery branch occlusion, left eye: Secondary | ICD-10-CM

## 2020-04-08 NOTE — Progress Notes (Signed)
[facility] [address] [place] [address] [phone]       HOSPITAL FOLLOW UP NOTE  [name] Date of Birth:  [birth year] Medical Record Number:  [id number]   Reason for Referral:  hospital stroke follow up  Virtual Visit via Video Note  I connected with Fae Pippin on 04/08/20 at  3:15 PM EST by a video enabled telemedicine application located in office and verified that I am speaking with the correct person using two identifiers who was located at their own home.   Laury Deep, CMA scheduled visit who discussed the limitations of evaluation and management by telemedicine and the availability of in person appointments. The patient expressed understanding and agreed to proceed.    SUBJECTIVE:   HPI:   [name] is a 85 y.o. male w/pmh of atrial fibrillation on anticoagulation with Eliquis, CAD, hypertension, hyperlipidemia who presented on 03/10/2020 with sudden onset of monocular superior altitudinal deficit in the left eye.  Personally reviewed hospitalization pertinent notes, lab work and imaging with summary provided.  Evaluated by Dr. Erlinda Hong with MRI negative for acute stroke although ophthalmologist saw [name] plaque consistent with L BRAO likely due to left ICA unstable plaque without high-grade stenosis.  CTA head/neck showed calcified plaque at the bifurcation to left carotid and proximal left internal carotid with less than 50% stenosis. Evaluated by Dr. Trula Slade [facility] with plans on proceeding with left CEA in the near future.  2D echo EF 55 to 60% with mildly dilated left atrium and reduced right ventricular systolic function.  Advised continuation of Eliquis and added aspirin 81 mg daily for secondary stroke prevention with history of A. Fib. Hx of HTN stable on hydrochlorothiazide and carvedilol PTA. Hx of HLD on pravastatin 40 mg daily with LDL 85.  Other stroke risk factors include CAD and advanced age with no  prior stroke history.  Residual deficit of left eye upper visual field vision loss.  Discharged home in stable condition without therapy needs.  Today, 04/08/2020, Mr. Zaldivar is being seen for hospital follow-up via virtual visit. He continues to have left eye upper visual loss. He denies any improvement but denies worsening. He has been seen again by ophtholmology since d/c. He has f/u again in Feb Dr. [name]. He questions possible evaluation by a speciality ophthalmologist. Denies new stroke/TIA symptoms.  Remains on Eliquis and aspirin without bleeding or bruising.  Previously on pravastatin 80 mg daily decrease to 40 mg daily prior to stroke and PCP recently increased dosage back to 80 mg daily dosing tolerating without side effects.  He does report history of dizziness evaluated by cardiology and ENT without clear cause.  Typically more present upon awakening in the morning.  History of RLS and insomnia.  He has not previously had sleep study to assess for sleep apnea. He is concerned regarding follow up date with Dr. Trula Slade is not until mid January which would be 2 months post hospital d/c. No further concerns at this time.     ROS:   14 system review of systems performed and negative with exception of those listed in HPI  PMH:  Past Medical History:  Diagnosis Date  . Anticoagulated on Coumadin    followed in CVRR at [facility]  . Aortic stenosis    a. Echo 2/15: Severe LVH, EF 55-60%, Gr 1 DD, mild to mod AS (mean 20 mmHg, peak 31 mmHg), AVA 1.4-1.5 cm2, mild AI, MAC, trivial MR, LA upper  limits of normal, RVSP 22 mmHg, mild RAE;   b. Echo 2/16:  EF 55-60%, no RWMA, mild AS (mean 17 mmHg, peak 27 mmHg), AVA 1.5 cm2, mild AI, MAC, mild LAE  . Arthritis   . Bladder neoplasm   . Chronic cough    NO CARDIAC OR PULMONARY RELATED  . Coronary artery disease CARDIOLOGIST-  DR KLEIN/ ALLRED   a. s/p CABG 2004, b. LHC with patent grafts 07/2005, ejection fraction 50%;  c. Myoview 2/15: no  ischemia, EF 61%  . Dry eyes   . Dyslipidemia   . GERD (gastroesophageal reflux disease)   . HTN (hypertension)   . Mild aortic stenosis   . PAF (paroxysmal atrial fibrillation) ([place])    CHADS2-VASc:  4  . Paroxysmal atrial fibrillation (HCC)   . 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 Amiodarone Rx  . S/P CABG x 5 2004    PSH:  Past Surgical History:  Procedure Laterality Date  . CARDIAC CATHETERIZATION  07-26-2005  DR GAMBLE   PRESERVED LVF/  EF 50%/ PATENT GRAFTS  . CARDIAC CATHETERIZATION  03-04-2003  DR GAMBLE   SEVERE 3 VESSEL DISEASE  . [place]   PAF/ FALSE POSITIVE STRESS TEST  . CATARACT EXTRACTION W/ INTRAOCULAR LENS IMPLANT Right   . CORONARY ARTERY BYPASS GRAFT  03-08-2003  DR OEUMPNTI   LIMA TO LAD/ SVG TO OM2/  SVG TO DIAGONAL / SVG TO PDA & PLA  . CYSTOSCOPY WITH BIOPSY N/A 12/25/2012   Procedure: CYSTOSCOPY WITH BLADDER BIOPSY;  Surgeon: Fredricka Bonine, MD;  Location: [facility];  Service: Urology;  Laterality: N/A;  . ELECTROPHYSIOLOGY STUDY  07-27-2005  DR Carleene Overlie TAYLOR   MAPPING --   RESULT NONINDUCIBLE VT OR SVT/  DX RIGHT VENTRICULAR OUTFLOW TRACT VT AND RULES OUT MORE MALIGNANT CAUSES OF VT  . INGUINAL HERNIA REPAIR Bilateral [place]  . KNEE ARTHROSCOPY Right 1994  . LUMBAR DISC SURGERY  1999   L4 -- L5  . MOHS SURGERY     by Dr. Levada Dy 2019  . REMOVAL VOCAL CORD POLYPS  1978  . TONSILLECTOMY  AS CHILD  . TRANSTHORACIC ECHOCARDIOGRAM  08-08-2011   MILD LVH/  EF 14-43%/  GRADE I DIASTOLIC DYSFUNCTION/ MILD AV STENOSIS    Social History:  Social History   Socioeconomic History  . Marital status: Married    Spouse name: Not on file  . Number of children: 3  . Years of education: Not on file  . Highest education level: Not on file  Occupational History  . Occupation: Retired  Tobacco Use  . Smoking status: Former Smoker    Packs/day: 1.00     Years: 25.00    Pack years: 25.00    Types: Cigarettes    Quit date: 04/25/1980    Years since quitting: 39.9  . Smokeless tobacco: Never Used  Substance and Sexual Activity  . Alcohol use: No    Alcohol/week: 0.0 standard drinks  . Drug use: No  . Sexual activity: Not on file  Other Topics Concern  . 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 Difficulty of Paying Living Expenses: Not hard at all  Food Insecurity: No Food Insecurity  .  Worried About Charity fundraiser in the Last Year: Never true  . Ran Out of Food in the Last Year: Never true  Transportation Needs: No Transportation Needs  . Lack of Transportation (Medical): No  . Lack of Transportation (Non-Medical): No  Physical Activity: Inactive  . Days of Exercise per Week: 0 days  . Minutes of Exercise per Session: 0 min  Stress: No Stress Concern Present  . Feeling of Stress : Not at all  Social Connections: Not on file  Intimate Partner Violence: Not At Risk  . Fear of Current or Ex-Partner: No  . Emotionally Abused: No  . Physically Abused: No  . Sexually Abused: No    Family History:  Family History  Problem Relation Age of Onset  . Cancer Mother     Medications:   Current Outpatient Medications on File Prior to Visit  Medication Sig Dispense Refill  . acetaminophen (TYLENOL) 325 MG tablet Take 650 mg by mouth once as needed for moderate pain or headache.    Marland Kitchen apixaban (ELIQUIS) 5 MG TABS tablet Take 1 tablet (5 mg total) by mouth 2 (two) times daily. 180 tablet 3  . aspirin EC 81 MG tablet Take 1 tablet (81 mg total) by mouth daily. Swallow whole. 150 tablet 2  . butalbital-acetaminophen-caffeine (FIORICET, ESGIC) 50-325-40 MG per tablet Take 1 tablet by mouth 3 (three) times daily as needed for migraine.     . cholecalciferol (VITAMIN D) 1000 UNITS  tablet Take 1,000 Units by mouth daily.    Marland Kitchen diltiazem (CARDIZEM CD) 120 MG 24 hr capsule Take 1 capsule (120 mg) by mouth once daily    . fluticasone (FLONASE) 50 MCG/ACT nasal spray Place 1 spray into both nostrils daily. 16 g 1  . hydrochlorothiazide (HYDRODIURIL) 12.5 MG tablet TAKE 1 TABLET BY MOUTH EACH MORNING 90 tablet 3  . loratadine (CLARITIN) 10 MG tablet Take 10 mg by mouth daily.    . meclizine (ANTIVERT) 25 MG tablet Take 1 tablet (25 mg total) by mouth 2 (two) times daily as needed for dizziness. 60 tablet 1  . Melatonin 3 MG TABS Take 3 mg by mouth at bedtime.     Vladimir Faster Glycol-Propyl Glycol (SYSTANE ULTRA OP) Apply 1-2 drops to eye once as needed (dry eyes.).     Marland Kitchen potassium chloride (K-DUR) 10 MEQ tablet Take 10 mEq by mouth every morning. Take as directed    . pravastatin (PRAVACHOL) 80 MG tablet Take 1 tablet (80 mg total) by mouth daily.     No current facility-administered medications on file prior to visit.    Allergies:   Allergies  Allergen Reactions  . Ace Inhibitors Other (See Comments)    COUGH  . Chocolate     Headaches   . Doxycycline     Nausea and abd pain  . Gabapentin     Dizziness  . Hycodan [Hydrocodone-Homatropine] Nausea And Vomiting  . Oxycodone Nausea Only    Pt states that he cannot take prescription pain medication  . Zocor [Simvastatin] Other (See Comments)    MYALGIA  . Cephalexin Rash      OBJECTIVE:  Physical Exam General: well developed, well nourished, very pleasant elderly Caucasian male, seated, in no evident distress Head: head normocephalic and atraumatic.    Neurologic Exam Mental Status: Awake and fully alert.  Fluent speech and language.  Oriented to place and time. Recent and remote memory intact. Attention span, concentration and fund of knowledge appropriate. Mood and  affect appropriate.  Cranial Nerves: Extraocular movements full without nystagmus.  Subjective OS superior visual loss.  Hearing intact to voice.  Facial sensation intact. Face, tongue, palate moves normally and symmetrically.  Shoulder shrug symmetric. Motor: No evidence of weakness per drift assessment Sensory.: intact to light touch Coordination: Rapid alternating movements normal in all extremities. Finger-to-nose and heel-to-shin performed accurately bilaterally. Gait and Station: Arises from chair without difficulty. Stance is normal. Gait demonstrates normal stride length and balance .  Reflexes: UTA    NIHSS  1 Modified Rankin  1      ASSESSMENT: [name] [name] is a 85 y.o. year old male presented with sudden onset monocular superior left eye visual loss on 03/10/2020 in setting of L BRAO secondary to left ICA unstable plaque without high-grade stenosis. Vascular risk factors include A. fib on Eliquis, HTN, HLD and CAD.      PLAN:  1. L BRAO :  a. Residual deficit: OS superior visual loss.  Advised to continue to follow with current ophthalmologist Dr. [name].  Unsure benefit from being seen by neuro-ophthalmology but advised patient this will be further looked into b. Continue aspirin 81 mg daily and Eliquis (apixaban) daily  and pravastatin 80 mg daily for secondary stroke prevention.  c. Discussed secondary stroke prevention measures and importance of close PCP follow up for aggressive stroke risk factor management  2. L ICA plaque: f/u w/ Dr. Trula Slade currently scheduled 05/18/2020. Will call office in attempts to patient being seen sooner as this will be 2 months post discharge 3. Atrial fibrillation: On Eliquis 5 mg twice daily per PCP 4. HTN: BP goal <130/90. Continue f/u with PCP 5. HLD: LDL goal <70. Recent LDL 85.  On atorvastatin 80 mg daily 6. At risk for sleep apnea: Referral placed to [facility] [facility] to further evaluate.  Discussed possible contributing factor to morning dizziness, hx of RLS and insomnia.     Follow up in 4 months or call earlier if needed   CC:  [facility] provider: Dr. Oliver Hum,  Elveria Rising, MD    I spent 45 minutes of non-face-to-face time via MyChart virtual visit with patient.  This included previsit chart review including recent hospitalization pertinent progress notes, lab work and imaging, lab review, study review, order entry, electronic health record documentation, patient education regarding recent stroke and etiology, residual deficits, possible underlying sleep apnea, importance of managing stroke risk factors and answered all other questions to patient satisfaction   Frann Rider, AGNP-BC  [facility] [address] [place], [place] [address]  Phone [phone] Fax [phone] Note: This document was prepared with digital dictation and possible smart phrase technology. Any transcriptional errors that result from this process are unintentional.

## 2020-04-14 NOTE — Progress Notes (Signed)
I agree with the above plan 

## 2020-04-25 HISTORY — PX: CAROTID ENDARTERECTOMY: SUR193

## 2020-05-07 DIAGNOSIS — C4441 Basal cell carcinoma of skin of scalp and neck: Secondary | ICD-10-CM | POA: Diagnosis not present

## 2020-05-07 DIAGNOSIS — D485 Neoplasm of uncertain behavior of skin: Secondary | ICD-10-CM | POA: Diagnosis not present

## 2020-05-07 DIAGNOSIS — L820 Inflamed seborrheic keratosis: Secondary | ICD-10-CM | POA: Diagnosis not present

## 2020-05-07 DIAGNOSIS — C44519 Basal cell carcinoma of skin of other part of trunk: Secondary | ICD-10-CM | POA: Diagnosis not present

## 2020-05-07 DIAGNOSIS — Z85828 Personal history of other malignant neoplasm of skin: Secondary | ICD-10-CM | POA: Diagnosis not present

## 2020-05-07 DIAGNOSIS — L570 Actinic keratosis: Secondary | ICD-10-CM | POA: Diagnosis not present

## 2020-05-07 DIAGNOSIS — L821 Other seborrheic keratosis: Secondary | ICD-10-CM | POA: Diagnosis not present

## 2020-05-07 DIAGNOSIS — D1801 Hemangioma of skin and subcutaneous tissue: Secondary | ICD-10-CM | POA: Diagnosis not present

## 2020-05-14 ENCOUNTER — Institutional Professional Consult (permissible substitution): Payer: Medicare Other | Admitting: Neurology

## 2020-05-18 ENCOUNTER — Encounter: Payer: Self-pay | Admitting: Surgery

## 2020-05-18 ENCOUNTER — Other Ambulatory Visit: Payer: Self-pay

## 2020-05-18 ENCOUNTER — Ambulatory Visit (INDEPENDENT_AMBULATORY_CARE_PROVIDER_SITE_OTHER): Payer: Medicare Other | Admitting: Surgery

## 2020-05-18 VITALS — BP 162/88 | HR 89 | Temp 98.4°F | Resp 16 | Ht 70.0 in | Wt 195.0 lb

## 2020-05-18 DIAGNOSIS — I6522 Occlusion and stenosis of left carotid artery: Secondary | ICD-10-CM | POA: Diagnosis not present

## 2020-05-18 NOTE — Progress Notes (Signed)
[facility] [place]  Patient name: [name] [name] MRN: [id number] DOB: May 24, 1934 Sex: male   REASON FOR VISIT:    Follow-up  HISOTRY OF PRESENT ILLNESS:    [name] [name] is a 86 y.o. male who I was consulted on in the hospital on 03/11/2020.  The patient presented with left eye vision loss.  He was seen by his ophthalmologist who diagnosed left retinal artery occlusion.  He was sent to the hospital and admitted.  He had a CT angiogram that showed soft and calcific plaque at his left carotid bifurcation.  The stenosis was felt to be 50%.  In discussions with the stroke service, the etiology for his stroke was either his A. fib or the carotid lesion, and we both felt that the carotid lesion was the most likely culprit.  He did have a echocardiogram that was negative for intracardiac source.  His ejection fraction was 55-60%.  The patient did not have any other neurologic symptoms.  He did not receive TPA.  The patient's carotid artery was noted to be very tortuous, and I felt his best option would be carotid endarterectomy.  The patient wanted to go home to further think about this.  He is back today for follow-up.  He has not had any new symptoms.  The patient suffers from atrial fibrillation for which he is on Eliquis.  He takes a statin for hypercholesterolemia.  His most recent LDL was 85.  He has a history of coronary artery disease, status post CABG.   PAST MEDICAL HISTORY:   Past Medical History:  Diagnosis Date  . Anticoagulated on Coumadin    followed in [facility]RR at [facility]  . Aortic stenosis    a. Echo 2/15: Severe LVH, EF 55-60%, Gr 1 DD, mild to mod AS (mean 20 mmHg, peak 31 mmHg), AVA 1.4-1.5 cm2, mild AI, MAC, trivial MR, LA upper limits of normal, RVSP 22 mmHg, mild RAE;   b. Echo 2/16:  EF 55-60%, no RWMA, mild AS (mean 17 mmHg, peak 27 mmHg), AVA 1.5 cm2, mild AI, MAC, mild LAE  . Arthritis   . Bladder  neoplasm   . Chronic cough    NO CARDIAC OR PULMONARY RELATED  . Coronary artery disease CARDIOLOGIST-  DR KLEIN/ ALLRED   a. s/p CABG 2004, b. LHC with patent grafts 07/2005, ejection fraction 50%;  c. Myoview 2/15: no ischemia, EF 61%  . Dry eyes   . Dyslipidemia   . GERD (gastroesophageal reflux disease)   . HTN (hypertension)   . Mild aortic stenosis   . PAF (paroxysmal atrial fibrillation) ([place])    CHADS2-VASc:  4  . Paroxysmal atrial fibrillation (HCC)   . 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 Amiodarone Rx  . S/P CABG x 5 2004     FAMILY HISTORY:   Family History  Problem Relation Age of Onset  . Cancer Mother     SOCIAL HISTORY:   Social History   Tobacco Use  . Smoking status: Former Smoker    Packs/day: 1.00    Years: 25.00    Pack years: 25.00    Types: Cigarettes    Quit date: 04/25/1980    Years since quitting: 40.0  . Smokeless tobacco: Never Used  Substance Use Topics  . Alcohol use: No    Alcohol/week: 0.0 standard drinks     ALLERGIES:   Allergies  Allergen Reactions  . Ace Inhibitors Other (  See Comments)    COUGH  . Chocolate     Headaches   . Doxycycline     Nausea and abd pain  . Gabapentin     Dizziness  . Hycodan [Hydrocodone-Homatropine] Nausea And Vomiting  . Oxycodone Nausea Only    Pt states that he cannot take prescription pain medication  . Zocor [Simvastatin] Other (See Comments)    MYALGIA  . Cephalexin Rash     CURRENT MEDICATIONS:   Current Outpatient Medications  Medication Sig Dispense Refill  . acetaminophen (TYLENOL) 325 MG tablet Take 650 mg by mouth once as needed for moderate pain or headache.    Marland Kitchen apixaban (ELIQUIS) 5 MG TABS tablet Take 1 tablet (5 mg total) by mouth 2 (two) times daily. 180 tablet 3  . aspirin EC 81 MG tablet Take 1 tablet (81 mg total) by mouth daily. Swallow whole. 150 tablet 2  . butalbital-acetaminophen-caffeine (FIORICET, ESGIC) 50-325-40 MG  per tablet Take 1 tablet by mouth 3 (three) times daily as needed for migraine.     . cholecalciferol (VITAMIN D) 1000 UNITS tablet Take 1,000 Units by mouth daily.    Marland Kitchen diltiazem (CARDIZEM CD) 120 MG 24 hr capsule Take 1 capsule (120 mg) by mouth once daily    . fluticasone (FLONASE) 50 MCG/ACT nasal spray Place 1 spray into both nostrils daily. 16 g 1  . hydrochlorothiazide (HYDRODIURIL) 12.5 MG tablet TAKE 1 TABLET BY MOUTH EACH MORNING 90 tablet 3  . loratadine (CLARITIN) 10 MG tablet Take 10 mg by mouth daily.    . meclizine (ANTIVERT) 25 MG tablet Take 1 tablet (25 mg total) by mouth 2 (two) times daily as needed for dizziness. 60 tablet 1  . Melatonin 3 MG TABS Take 3 mg by mouth at bedtime.     Vladimir Faster Glycol-Propyl Glycol (SYSTANE ULTRA OP) Apply 1-2 drops to eye once as needed (dry eyes.).     Marland Kitchen potassium chloride (K-DUR) 10 MEQ tablet Take 10 mEq by mouth every morning. Take as directed    . pravastatin (PRAVACHOL) 80 MG tablet Take 1 tablet (80 mg total) by mouth daily.     No current facility-administered medications for this visit.    REVIEW OF SYSTEMS:   _0  denotes positive finding, _1  denotes negative finding Cardiac  Comments:  Chest pain or chest pressure:    Shortness of breath upon exertion:    Short of breath when lying flat:    Irregular heart rhythm:        Vascular    Pain in calf, thigh, or hip brought on by ambulation:    Pain in feet at night that wakes you up from your sleep:     Blood clot in your veins:    Leg swelling:         Pulmonary    Oxygen at home:    Productive cough:     Wheezing:         Neurologic    Sudden weakness in arms or legs:     Sudden numbness in arms or legs:     Sudden onset of difficulty speaking or slurred speech:    Temporary loss of vision in one eye:  x   Problems with dizziness:         Gastrointestinal    Blood in stool:     Vomited blood:         Genitourinary    Burning when urinating:     Blood  in  urine:        Psychiatric    Major depression:         Hematologic    Bleeding problems:    Problems with blood clotting too easily:        Skin    Rashes or ulcers:        Constitutional    Fever or chills:      PHYSICAL EXAM:   There were no vitals filed for this visit.  GENERAL: The patient is a well-nourished male, in no acute distress. The vital signs are documented above. CARDIAC: There is a regular rate and rhythm.  PULMONARY: Non-labored respirations ABDOMEN: Soft and non-tender with normal pitched bowel sounds.  NEUROLOGIC: No focal weakness or paresthesias are detected. SKIN: There are no ulcers or rashes noted. PSYCHIATRIC: The patient has a normal affect.  STUDIES:   I have reviewed his CTA with the following findings: No acute intracranial abnormality. Moderate chronic microvascular ischemic changes.  Calcified plaque at the left greater than right ICA origins without hemodynamically significant stenosis.  No proximal intracranial vessel occlusion or significant stenosis.  MEDICAL ISSUES:   Symptomatic left carotid stenosis: The patient was here with his wife.  We discussed the CT scan findings and that Dr. Erlinda Hong and myself feel the blockages greater than 50% and the most likely explanation for his vision loss.  I have recommended proceeding with left carotid endarterectomy.  We discussed the risk and benefits including risk of stroke, nerve injury.  All the questions were answered.  He would like to think about whether or not to proceed and will contact me once he has made a final decision.  He is not a candidate for TCAR given the tortuosity within his carotid artery    Annamarie Major, IV, MD, FACS [facility] [phone] Pager [phone]

## 2020-05-28 ENCOUNTER — Telehealth: Payer: Self-pay | Admitting: *Deleted

## 2020-05-28 NOTE — Telephone Encounter (Signed)
Pt called to discuss if dizziness and vision changes could be related to carotid stenosis. Advised pt that it could be related but there is no way to really be sure. Pt is very nervous about the CEA recommended by Dr. Trula Slade  and would like to continue to think about it before he makes a decision about surgery.

## 2020-06-09 ENCOUNTER — Telehealth: Payer: Self-pay

## 2020-06-09 ENCOUNTER — Other Ambulatory Visit: Payer: Self-pay

## 2020-06-09 NOTE — Telephone Encounter (Signed)
Patient called to report some dizziness that comes and goes and asked for thoughts. Advised him that it could be due to his carotid, but unable to be sure.  He is still thinking about the surgery and will call back.

## 2020-06-10 ENCOUNTER — Other Ambulatory Visit: Payer: Self-pay

## 2020-06-10 NOTE — Telephone Encounter (Signed)
Pt called, stating he decided to proceed with carotid surgery. Pt scheduled for left carotid endarterectomy on 06/25/20 with Dr. Trula Slade. All instructions reviewed. Pt verbalized understanding and repeated back. A letter was also sent via [place].

## 2020-06-12 NOTE — Telephone Encounter (Signed)
Pt requested to cancel CEA surgery scheduled for 06/24/20 with no plans to reschedule at this time. Pt stated he would like to do more research and talk to Dr. Trula Slade first if possible. Advised pt will have scheduling contact pt regarding appt.

## 2020-06-16 DIAGNOSIS — H35373 Puckering of macula, bilateral: Secondary | ICD-10-CM | POA: Diagnosis not present

## 2020-06-16 DIAGNOSIS — H0102A Squamous blepharitis right eye, upper and lower eyelids: Secondary | ICD-10-CM | POA: Diagnosis not present

## 2020-06-16 DIAGNOSIS — Z961 Presence of intraocular lens: Secondary | ICD-10-CM | POA: Diagnosis not present

## 2020-06-16 DIAGNOSIS — H5319 Other subjective visual disturbances: Secondary | ICD-10-CM | POA: Diagnosis not present

## 2020-06-16 DIAGNOSIS — H16223 Keratoconjunctivitis sicca, not specified as Sjogren's, bilateral: Secondary | ICD-10-CM | POA: Diagnosis not present

## 2020-06-16 DIAGNOSIS — H40053 Ocular hypertension, bilateral: Secondary | ICD-10-CM | POA: Diagnosis not present

## 2020-06-16 DIAGNOSIS — H0102B Squamous blepharitis left eye, upper and lower eyelids: Secondary | ICD-10-CM | POA: Diagnosis not present

## 2020-06-16 DIAGNOSIS — H43813 Vitreous degeneration, bilateral: Secondary | ICD-10-CM | POA: Diagnosis not present

## 2020-06-16 DIAGNOSIS — H34212 Partial retinal artery occlusion, left eye: Secondary | ICD-10-CM | POA: Diagnosis not present

## 2020-06-17 ENCOUNTER — Telehealth: Payer: Self-pay

## 2020-06-17 NOTE — Telephone Encounter (Signed)
Per appt notes pt already scheduled with Dr Glori Bickers on 06/19/20 at 9:30. Sending note to [name] as PCP and Dr Glori Bickers and Marks CMA.

## 2020-06-17 NOTE — Telephone Encounter (Signed)
FYI to Dr. Glori Bickers.  And I appreciate her input.  I will defer to the eye clinic about treating his pressure.  He has seen vascular surgery about his carotid.  My understanding was that he was scheduled for carotid endarterectomy but this appears to have been canceled in the EMR.

## 2020-06-17 NOTE — Telephone Encounter (Signed)
[facility] - Client TELEPHONE ADVICE RECORD [facility] Patient Name: [name] [name] Gender: Male DOB: 02-Nov-1934 Age: 86 Y 64 M 3 D Return Phone Number: [phone] (Primary), [phone] (Secondary) Address[place] [address] Client [facility] - Client Client Site [place] Physician Renford Dills - MD Contact Type Call Who Is Calling Patient / Member / Family / Caregiver Call Type Triage / Clinical Relationship To Patient Self Return Phone Number [phone] (Primary) Chief Complaint Dizziness Reason for Call Symptomatic / Request for Bernard states he has been having blurred vision, brain fog 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 Also has dizziness and does not feel well. Went to the eye Dr yesterday and they discovered that the eye pressure in his right eye is higher than it should be. States he also has a blockage in his carotid artery that he needs to schedule a Sx for. Translation No Nurse Assessment Nurse: Harlow Mares, RN, Suanne Marker Date/Time Eilene Ghazi Time): 06/17/2020 11:55:46 AM Confirm and document reason for call. If symptomatic, describe symptoms. 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 Also has dizziness and does not feel well. Went to the eye Dr yesterday and they discovered that the eye pressure in his right eye is higher than it should be. States he also has a blockage in his carotid artery that he needs to schedule a Sx for. Does the patient have any new or worsening symptoms? ---Yes Will a triage be completed? ---Yes Related visit to physician within the last 2 weeks? ---Yes Does the PT have any chronic conditions? (i.e. diabetes, asthma, this includes High risk factors for pregnancy, etc.) ---Yes List chronic conditions. ---hx CVA; HTN; a-fib; Is this a  behavioral health or substance abuse call? ---No Guidelines Guideline Title Affirmed Question Affirmed Notes Nurse Date/Time (Eastern Time) Dizziness - Lightheadedness [1] MODERATE dizziness (e.g., interferes with normal activities) AND [2] has NOT been evaluated by physician for this (Exception: dizziness Harlow Mares, RN, Suanne Marker 06/17/2020 12:05:38 PM PLEASE NOTE: All timestamps contained within this report are represented as [place] Standard Time. CONFIDENTIALTY NOTICE: This fax transmission is intended only for the addressee. It contains information that is legally privileged, confidential or otherwise protected from use or disclosure. If you are not the intended recipient, you are strictly prohibited from reviewing, disclosing, copying using or disseminating any of this information or taking any action in reliance on or regarding this information. If you have received this fax in error, please notify us immediately by telephone so that we can arrange for its return to [place]. Phone: [phone], Toll-Free: [phone], Fax: [phone] Page: 2 of 2 Call Id: [id number] Guidelines Guideline Title Affirmed Question Affirmed Notes Nurse Date/Time Eilene Ghazi Time) caused by heat exposure, sudden standing, or poor fluid intake) Disp. Time Eilene Ghazi Time) Disposition Final User 06/17/2020 12:20:36 PM See PCP within 24 Hours Yes Harlow Mares, RN, Rosalyn Charters Disagree/Comply Comply Caller Understands Yes PreDisposition Call Doctor Care Advice Given Per Guideline SEE PCP WITHIN 24 HOURS: * IF OFFICE WILL BE OPEN: You need to be examined within the next 24 hours. Call your doctor (or NP/PA) when the office opens and make an appointment. LIE DOWN AND REST: * Lie down with feet elevated for 1 hour. * This will improve circulation and increase blood flow to the brain. CALL BACK IF: * Passes out (faints) * You become worse CARE ADVICE given  per Dizziness (Adult) guideline. Referrals REFERRED TO PCP  OFFICE

## 2020-06-19 ENCOUNTER — Encounter: Payer: Self-pay | Admitting: Family Medicine

## 2020-06-19 ENCOUNTER — Ambulatory Visit (INDEPENDENT_AMBULATORY_CARE_PROVIDER_SITE_OTHER): Payer: Medicare Other | Admitting: Family Medicine

## 2020-06-19 ENCOUNTER — Other Ambulatory Visit: Payer: Self-pay

## 2020-06-19 VITALS — BP 136/82 | HR 89 | Temp 96.9°F | Ht 70.0 in | Wt 195.3 lb

## 2020-06-19 DIAGNOSIS — I6522 Occlusion and stenosis of left carotid artery: Secondary | ICD-10-CM

## 2020-06-19 DIAGNOSIS — F430 Acute stress reaction: Secondary | ICD-10-CM | POA: Diagnosis not present

## 2020-06-19 DIAGNOSIS — H349 Unspecified retinal vascular occlusion: Secondary | ICD-10-CM | POA: Diagnosis not present

## 2020-06-19 DIAGNOSIS — R42 Dizziness and giddiness: Secondary | ICD-10-CM

## 2020-06-19 DIAGNOSIS — H409 Unspecified glaucoma: Secondary | ICD-10-CM | POA: Insufficient documentation

## 2020-06-19 NOTE — Progress Notes (Signed)
Subjective:    Patient ID: [name] [name], male    DOB: 1934-11-16, 86 y.o.   MRN: [id number]  This visit occurred during the SARS-CoV-2 public health emergency.  Safety protocols were in place, including screening questions prior to the visit, additional usage of staff PPE, and extensive cleaning of exam room while observing appropriate contact time as indicated for disinfecting solutions.    HPI 86 yo pt of [name] presents with eye problem and dizziness  Wt Readings from Last 3 Encounters:  06/19/20 195 lb 5 oz (88.6 kg)  05/18/20 195 lb (88.5 kg)  03/31/20 195 lb (88.5 kg)   28.02 kg/m  Dizziness is off and on  Had it this am  Also brain fog and blurry vision  Pt has hx of recent retinal artery occlusion on L   (baseline vision problem and cannot see area above what he sees)  Carotid doppler showed 50% or more ICA stenosis with very tortuous vessel   CT angio report IMPRESSION: No acute intracranial abnormality. Moderate chronic microvascular ischemic changes.  Calcified plaque at the left greater than right ICA origins without hemodynamically significant stenosis.  No proximal intracranial vessel occlusion or significant stenosis.   Electronically Signed   By: [name] M.D.   On: 03/10/2020 19:11   He takes eliiquis for a fib   Has seen vasc surg and endarterectomy was planned  He canceled it because he got nervous   His eye doctor gave him lumigan drops   Dr [name] (visit on tues) - no side effects as of now  Has eye pressure in R eye  occ feels like his eyes feel like they are trying to converge   Jittery feeling and trouble concentrating  Hard to focus on item and worries about things    Takes high dose pravastatin  Lab Results  Component Value Date   CHOL 152 03/11/2020   HDL 52 03/11/2020   LDLCALC 85 03/11/2020   TRIG 73 03/11/2020   CHOLHDL 2.9 03/11/2020   Also CAD with h/o CABG  BP Readings from Last 3 Encounters:   06/19/20 136/82  05/18/20 (!) 162/88  03/31/20 (!) 156/90     Also a h/o vertigo   HTN BP Readings from Last 3 Encounters:  06/19/20 136/82  05/18/20 (!) 162/88  03/31/20 (!) 156/90   Pulse Readings from Last 3 Encounters:  06/19/20 89  05/18/20 89  03/31/20 90   No past h/o anxiety  Declines counseling   Has upcoming appt with neurology  Light headed (no spinning lately)  Is very careful to rise from lying/sitting - takes his time No falls   Patient Active Problem List   Diagnosis Date Noted  . Glaucoma (increased eye pressure) 06/19/2020  . Stress reaction 06/19/2020  . Retinal artery occlusion 03/10/2020  . Constipation 12/22/2019  . Right foot pain 09/11/2019  . Leg pain 06/23/2019  . Muscle pain 04/04/2019  . Sciatica 12/28/2017  . Neuropathy 10/07/2017  . Foot swelling 10/07/2017  . Shoulder pain 03/29/2017  . CAD (coronary artery disease), autologous vein bypass graft 10/31/2016  . Family history of colon cancer 10/31/2016  . History of adenomatous polyp of colon 10/31/2016  . Osteoarthritis 10/31/2016  . Atrial fibrillation ([place]) 10/31/2016  . Syncope 02/11/2016  . High risk medication use   . Skin lesion 06/04/2015  . Lower back pain 05/01/2015  . Elevated TSH 12/26/2014  . Back pain 01/22/2014  . Aortic stenosis 05/16/2013  . Bladder  neoplasm 11/23/2012  . BCC (basal cell carcinoma of skin) 08/29/2012  . Vertigo 08/24/2012  . GERD (gastroesophageal reflux disease) 05/01/2012  . Hyperlipidemia 03/14/2012  . TMJ pain dysfunction syndrome 01/20/2012  . Long term (current) use of anticoagulants 08/31/2011  . RVOT ventricular tachycardia ([place])   . Cough 04/29/2009  . HEARING LOSS, SUDDEN 12/03/2007  . Essential hypertension 02/22/2007  . PAF (paroxysmal atrial fibrillation) ([place]) 02/22/2007   Past Medical History:  Diagnosis Date  . Anticoagulated on Coumadin    followed in [facility] at [facility]  . Aortic stenosis    a. Echo 2/15: Severe  LVH, EF 55-60%, Gr 1 DD, mild to mod AS (mean 20 mmHg, peak 31 mmHg), AVA 1.4-1.5 cm2, mild AI, MAC, trivial MR, LA upper limits of normal, RVSP 22 mmHg, mild RAE;   b. Echo 2/16:  EF 55-60%, no RWMA, mild AS (mean 17 mmHg, peak 27 mmHg), AVA 1.5 cm2, mild AI, MAC, mild LAE  . Arthritis   . Bladder neoplasm   . Chronic cough    NO CARDIAC OR PULMONARY RELATED  . Coronary artery disease CARDIOLOGIST-  DR KLEIN/ ALLRED   a. s/p CABG 2004, b. LHC with patent grafts 07/2005, ejection fraction 50%;  c. Myoview 2/15: no ischemia, EF 61%  . Dry eyes   . Dyslipidemia   . GERD (gastroesophageal reflux disease)   . HTN (hypertension)   . Mild aortic stenosis   . PAF (paroxysmal atrial fibrillation) ([place])    CHADS2-VASc:  4  . Paroxysmal atrial fibrillation ([facility])   . RVOT-VT (right ventricular outflow tract ventricular tachycardia) ([place])    a. Amiodarone Rx  . S/P CABG x 5 2004   Past Surgical History:  Procedure Laterality Date  . CARDIAC CATHETERIZATION  07-26-2005  DR GAMBLE   PRESERVED LVF/  EF 50%/ PATENT GRAFTS  . CARDIAC CATHETERIZATION  03-04-2003  DR GAMBLE   SEVERE 3 VESSEL DISEASE  . [place]   PAF/ FALSE POSITIVE STRESS TEST  . CATARACT EXTRACTION W/ INTRAOCULAR LENS IMPLANT Right   . CORONARY ARTERY BYPASS GRAFT  03-08-2003  DR [id number]   LIMA TO LAD/ SVG TO OM2/  SVG TO DIAGONAL / SVG TO PDA & PLA  . CYSTOSCOPY WITH BIOPSY N/A 12/25/2012   Procedure: CYSTOSCOPY WITH BLADDER BIOPSY;  Surgeon: Fredricka Bonine, MD;  Location[facility];  Service: Urology;  Laterality: N/A;  . ELECTROPHYSIOLOGY STUDY  07-27-2005  DR Carleene Overlie TAYLOR   MAPPING --   RESULT NONINDUCIBLE VT OR SVT/  DX RIGHT VENTRICULAR OUTFLOW TRACT VT AND RULES OUT MORE MALIGNANT CAUSES OF VT  . INGUINAL HERNIA REPAIR Bilateral [place]  . KNEE ARTHROSCOPY Right 1994  . LUMBAR DISC SURGERY  1999   L4 -- L5  . MOHS SURGERY     by Dr. Levada Dy 2019  . REMOVAL  VOCAL CORD POLYPS  1978  . TONSILLECTOMY  AS CHILD  . TRANSTHORACIC ECHOCARDIOGRAM  08-08-2011   MILD LVH/  EF 33-82%/  GRADE I DIASTOLIC DYSFUNCTION/ MILD AV STENOSIS   Social History   Tobacco Use  . Smoking status: Former Smoker    Packs/day: 1.00    Years: 25.00    Pack years: 25.00    Types: Cigarettes    Quit date: 04/25/1980    Years since quitting: 40.1  . Smokeless tobacco: Never Used  Substance Use Topics  . Alcohol use: No    Alcohol/week: 0.0  standard drinks  . Drug use: No   Family History  Problem Relation Age of Onset  . Cancer Mother    Allergies  Allergen Reactions  . Ace Inhibitors Other (See Comments)    COUGH  . Chocolate     Headaches   . Doxycycline     Nausea and abd pain  . Gabapentin     Dizziness  . Hycodan [Hydrocodone-Homatropine] Nausea And Vomiting  . Oxycodone Nausea Only    Pt states that he cannot take prescription pain medication  . Zocor [Simvastatin] Other (See Comments)    MYALGIA  . Cephalexin Rash   Current Outpatient Medications on File Prior to Visit  Medication Sig Dispense Refill  . acetaminophen (TYLENOL) 325 MG tablet Take 650 mg by mouth once as needed for moderate pain or headache.    Marland Kitchen apixaban (ELIQUIS) 5 MG TABS tablet Take 1 tablet (5 mg total) by mouth 2 (two) times daily. 180 tablet 3  . aspirin EC 81 MG tablet Take 1 tablet (81 mg total) by mouth daily. Swallow whole. 150 tablet 2  . bimatoprost (LUMIGAN) 0.01 % SOLN Place 1 drop into both eyes at bedtime.    . butalbital-acetaminophen-caffeine (FIORICET, ESGIC) 50-325-40 MG per tablet Take 1 tablet by mouth 3 (three) times daily as needed for migraine.     . cholecalciferol (VITAMIN D) 1000 UNITS tablet Take 1,000 Units by mouth daily.    Marland Kitchen diltiazem (CARDIZEM CD) 120 MG 24 hr capsule Take 1 capsule (120 mg) by mouth once daily    . fluticasone (FLONASE) 50 MCG/ACT nasal spray Place 1 spray into both nostrils daily. 16 g 1  . hydrochlorothiazide (HYDRODIURIL)  12.5 MG tablet TAKE 1 TABLET BY MOUTH EACH MORNING 90 tablet 3  . loratadine (CLARITIN) 10 MG tablet Take 10 mg by mouth daily.    . meclizine (ANTIVERT) 25 MG tablet Take 1 tablet (25 mg total) by mouth 2 (two) times daily as needed for dizziness. 60 tablet 1  . Melatonin 3 MG TABS Take 3 mg by mouth at bedtime.    Vladimir Faster Glycol-Propyl Glycol (SYSTANE ULTRA OP) Apply 1-2 drops to eye once as needed (dry eyes.).     Marland Kitchen potassium chloride (K-DUR) 10 MEQ tablet Take 10 mEq by mouth every morning. Take as directed    . pravastatin (PRAVACHOL) 80 MG tablet Take 1 tablet (80 mg total) by mouth daily.     No current facility-administered medications on file prior to visit.     Review of Systems  Constitutional: Negative for activity change, appetite change, fatigue, fever and unexpected weight change.  HENT: Negative for congestion, rhinorrhea, sore throat and trouble swallowing.   Eyes: Positive for visual disturbance. Negative for pain, discharge, redness and itching.  Respiratory: Negative for cough, chest tightness, shortness of breath and wheezing.   Cardiovascular: Negative for chest pain and palpitations.  Gastrointestinal: Negative for abdominal pain, blood in stool, constipation, diarrhea and nausea.  Endocrine: Negative for cold intolerance, heat intolerance, polydipsia and polyuria.  Genitourinary: Negative for difficulty urinating, dysuria, frequency and urgency.  Musculoskeletal: Negative for arthralgias, joint swelling and myalgias.  Skin: Negative for pallor and rash.  Neurological: Positive for dizziness. Negative for tremors, syncope, facial asymmetry, weakness, numbness and headaches.  Hematological: Negative for adenopathy. Does not bruise/bleed easily.  Psychiatric/Behavioral: Positive for sleep disturbance. Negative for decreased concentration and dysphoric mood. The patient is nervous/anxious.        Objective:   Physical Exam Constitutional:  General: He is not  in acute distress.    Appearance: Normal appearance. He is normal weight. He is not ill-appearing.  HENT:     Head: Normocephalic and atraumatic.     Right Ear: Tympanic membrane, ear canal and external ear normal.     Left Ear: Tympanic membrane, ear canal and external ear normal.     Mouth/Throat:     Mouth: Mucous membranes are moist.  Eyes:     Extraocular Movements: Extraocular movements intact.     Conjunctiva/sclera: Conjunctivae normal.     Pupils: Pupils are equal, round, and reactive to light.     Comments: Few beats of horizontal nystagmus bilat  Mild dizziness after lateral gaze  Cardiovascular:     Rate and Rhythm: Normal rate. Rhythm irregular.  Pulmonary:     Effort: Pulmonary effort is normal. No respiratory distress.     Breath sounds: No wheezing.  Musculoskeletal:     Cervical back: Normal range of motion and neck supple. No tenderness.     Right lower leg: No edema.     Left lower leg: No edema.  Lymphadenopathy:     Cervical: No cervical adenopathy.  Skin:    General: Skin is warm and dry.     Coloration: Skin is not pale.     Findings: No bruising, erythema or rash.  Neurological:     Mental Status: He is alert.     Cranial Nerves: No cranial nerve deficit.     Sensory: No sensory deficit.     Motor: No weakness or tremor.     Coordination: Romberg sign negative. Coordination normal.     Gait: Gait normal.     Deep Tendon Reflexes: Reflexes normal.  Psychiatric:        Mood and Affect: Mood is anxious.        Cognition and Memory: Cognition and memory normal.     Comments: Pleasant  Discusses symptoms candidly           Assessment & Plan:   Problem List Items Addressed This Visit      Cardiovascular and Mediastinum   Retinal artery occlusion    L sided with tortuous vessel  Planned endarterectomy but then pt cancelled it due to uncertainty/anxiety  May req 2nd opinion  At this time doubt his dizziness (chronic and assoc with anxiety) is  related          Other   Vertigo    Ongoing dizziness is intermittent and positional Some reproduction on lateral gaze Otherwise nl exam today  Disc imp of changing position slowly and staying hydrated  Will f/u with pcp and update if worse or no imp      Glaucoma (increased eye pressure)    Pt has questions for his ophty specialist re: symptoms  Trouble focusing when eyes converge Plans to follow up        Relevant Medications   bimatoprost (LUMIGAN) 0.01 % SOLN   Stress reaction - Primary    Anxious about his carotid dz and surgical options as well as vision /glaucoma issues  Plans to f/u with specialists  Feels jittery/anxious and does worry Not tearful or depressed Reviewed stressors/ coping techniques/symptoms/ support sources/ tx options and side effects in detail today Suspect anxiety may worsen his dizziness as well  Offered counseling ref and he declined for now  May consider pharmacologic tx and d/w his pcp

## 2020-06-19 NOTE — Patient Instructions (Addendum)
Keep talking to your vascular surgeon about pros/cons of the endarterectomy surgery  Ask about a second opinion if needed   Keep talking to your eye doctor as well   I will pass info on to Dr Bunnie Philips may benefit from treatment of anxiety   Be cautious with movement- don't change position too quickly   If dizziness worsens let us know

## 2020-06-20 NOTE — Assessment & Plan Note (Signed)
Ongoing dizziness is intermittent and positional Some reproduction on lateral gaze Otherwise nl exam today  Disc imp of changing position slowly and staying hydrated  Will f/u with pcp and update if worse or no imp

## 2020-06-20 NOTE — Assessment & Plan Note (Signed)
L sided with tortuous vessel  Planned endarterectomy but then pt cancelled it due to uncertainty/anxiety  May req 2nd opinion  At this time doubt his dizziness (chronic and assoc with anxiety) is related

## 2020-06-20 NOTE — Assessment & Plan Note (Signed)
Pt has questions for his ophty specialist re: symptoms  Trouble focusing when eyes converge Plans to follow up

## 2020-06-20 NOTE — Assessment & Plan Note (Signed)
Anxious about his carotid dz and surgical options as well as vision /glaucoma issues  Plans to f/u with specialists  Feels jittery/anxious and does worry Not tearful or depressed Reviewed stressors/ coping techniques/symptoms/ support sources/ tx options and side effects in detail today Suspect anxiety may worsen his dizziness as well  Offered counseling ref and he declined for now  May consider pharmacologic tx and d/w his pcp

## 2020-06-22 ENCOUNTER — Telehealth: Payer: Self-pay

## 2020-06-22 NOTE — Telephone Encounter (Signed)
Patient called to report a cross eyed feeling sometimes. Wants to know if this could be a carotid symptom. Advised him no way to be sure. Will continue to think about surgery.

## 2020-06-23 ENCOUNTER — Other Ambulatory Visit: Payer: Self-pay

## 2020-06-24 ENCOUNTER — Other Ambulatory Visit (HOSPITAL_COMMUNITY): Payer: Medicare Other

## 2020-06-25 ENCOUNTER — Telehealth: Payer: Self-pay

## 2020-06-25 ENCOUNTER — Inpatient Hospital Stay: Admit: 2020-06-25 | Payer: Medicare Other | Admitting: Surgery

## 2020-06-25 SURGERY — ENDARTERECTOMY, CAROTID
Anesthesia: General | Laterality: Left

## 2020-06-25 NOTE — Telephone Encounter (Signed)
Spoke with pt who is scheduled for Left CEA surgery on 07/08/20 with Dr. Trula Slade. Offered to move pts surgery date to tomorrow, 06/26/20 per Dr. Trula Slade and availability. Pt declined, stating this date wouldn't work for him and will proceed with current date scheduled.

## 2020-07-01 NOTE — Pre-Procedure Instructions (Signed)
[name] [name]  07/01/2020    Your procedure is scheduled on Wed., July 08, 2020 from 11:25AM-1:50PM  Report to [facility] Entrance "A" at 9:25AM  Call this number if you have problems the morning of surgery:  [phone]   Remember:  Do not eat or drink after midnight on March 15th    Take these medicines the morning of surgery with A SIP OF WATER: Diltiazem (CARDIZEM CD) Loratadine (CLARITIN) Pravastatin (PRAVACHOL)   If Needed: Acetaminophen (TYLENOL) Fluticasone (FLONASE) Polyethyl Glycol-Propyl Glycol (SYSTANE ULTRA OP)  Take last dose of Apixaban Eliquis on 07/04/20, per Dr. Trula Slade  As of today, STOP taking all Aspirin (unless instructed by your doctor) and Other Aspirin containing products, Vitamins, Fish oils, and Herbal medications. Also stop all NSAIDS i.e. Advil, Ibuprofen, Motrin, Aleve, Anaprox, Naproxen, BC, Goody Powders, and all Supplements.   No Smoking of any kind, Tobacco/Vaping, or Alcohol products 24 hours prior to your procedure. If you use a Cpap at night, you may bring all equipment for your overnight stay.     Do not wear jewelry.  Do not wear lotions, powders, colognes, or deodorant.  Do not shave 48 hours prior to surgery.  Men may shave face and neck.  Do not bring valuables to the hospital.  [facility] is not responsible for any belongings or valuables.  Contacts, dentures or bridgework may not be worn into surgery.    For patients admitted to the hospital, discharge time will be determined by your treatment team.  Patients discharged the day of surgery will not be allowed to drive home, and someone age 84 and over needs to stay with them for 24 hours.   Special instructions:  [place]- Preparing For Surgery  Before surgery, you can play an important role. Because skin is not sterile, your skin needs to be as free of germs as possible. You can reduce the number of germs on your skin by washing with CHG (chlorahexidine  gluconate) Soap before surgery.  CHG is an antiseptic cleaner which kills germs and bonds with the skin to continue killing germs even after washing.    Oral Hygiene is also important to reduce your risk of infection.  Remember - BRUSH YOUR TEETH THE MORNING OF SURGERY WITH YOUR REGULAR TOOTHPASTE  Please do not use if you have an allergy to CHG or antibacterial soaps. If your skin becomes reddened/irritated stop using the CHG.  Do not shave (including legs and underarms) for at least 48 hours prior to first CHG shower. It is OK to shave your face.  Please follow these instructions carefully.   1. Shower the NIGHT BEFORE SURGERY and the MORNING OF SURGERY with CHG.   2. If you chose to wash your hair, wash your hair first as usual with your normal shampoo.  3. After you shampoo, rinse your hair and body thoroughly to remove the shampoo.  4. Use CHG as you would any other liquid soap. You can apply CHG directly to the skin and wash gently with a scrungie or a clean washcloth.   5. Apply the CHG Soap to your body ONLY FROM THE NECK DOWN.  Do not use on open wounds or open sores. Avoid contact with your eyes, ears, mouth and genitals (private parts). Wash Face and genitals (private parts)  with your normal soap.  6. Wash thoroughly, paying special attention to the area where your surgery will be performed.  7. Thoroughly rinse your body with warm water  from the neck down.  8. DO NOT shower/wash with your normal soap after using and rinsing off the CHG Soap.  9. Pat yourself dry with a CLEAN TOWEL.  10. Wear CLEAN PAJAMAS to bed the night before surgery, wear comfortable clothes the morning of surgery  11. Place CLEAN SHEETS on your bed the night of your first shower and DO NOT SLEEP WITH PETS.  Reminders: Do not apply any deodorants/lotions.  Please wear clean clothes to the hospital/surgery center.   Remember to brush your teeth WITH YOUR REGULAR TOOTHPASTE.  Please read over the  following fact sheets that you were given.

## 2020-07-02 ENCOUNTER — Encounter (HOSPITAL_COMMUNITY): Payer: Self-pay

## 2020-07-02 ENCOUNTER — Other Ambulatory Visit (HOSPITAL_COMMUNITY): Payer: Medicare Other

## 2020-07-02 ENCOUNTER — Other Ambulatory Visit: Payer: Self-pay

## 2020-07-02 ENCOUNTER — Encounter (HOSPITAL_COMMUNITY)
Admission: RE | Admit: 2020-07-02 | Discharge: 2020-07-02 | Disposition: A | Discharge status: Home / Self Care | Payer: Medicare Other | Source: Ambulatory Visit | Attending: Surgery | Admitting: Surgery

## 2020-07-02 DIAGNOSIS — Z01812 Encounter for preprocedural laboratory examination: Secondary | ICD-10-CM | POA: Diagnosis not present

## 2020-07-02 HISTORY — DX: Other complications of anesthesia, initial encounter: T88.59XA

## 2020-07-02 LAB — COMPREHENSIVE METABOLIC PANEL
ALT: 16 U/L (ref 0–44)
AST: 26 U/L (ref 15–41)
Albumin: 4 g/dL (ref 3.5–5.0)
Alkaline Phosphatase: 67 U/L (ref 38–126)
Anion gap: 6 (ref 5–15)
BUN: 13 mg/dL (ref 8–23)
CO2: 30 mmol/L (ref 22–32)
Calcium: 9.2 mg/dL (ref 8.9–10.3)
Chloride: 102 mmol/L (ref 98–111)
Creatinine, Ser: 0.88 mg/dL (ref 0.61–1.24)
GFR, Estimated: >60 mL/min (ref >60)
Glucose, Bld: 83 mg/dL (ref 70–99)
Potassium: 4.1 mmol/L (ref 3.5–5.1)
Sodium: 138 mmol/L (ref 135–145)
Total Bilirubin: 0.6 mg/dL (ref 0.3–1.2)
Total Protein: 7 g/dL (ref 6.5–8.1)

## 2020-07-02 LAB — CBC
HCT: 39.3 % (ref 39.0–52.0)
Hemoglobin: 13.7 g/dL (ref 13.0–17.0)
MCH: 32.8 pg (ref 26.0–34.0)
MCHC: 34.9 g/dL (ref 30.0–36.0)
MCV: 94 fL (ref 80.0–100.0)
Platelets: 222 10*3/uL (ref 150–400)
RBC: 4.18 MIL/uL — ABNORMAL LOW (ref 4.22–5.81)
RDW: 13.4 % (ref 11.5–15.5)
WBC: 6.6 10*3/uL (ref 4.0–10.5)
nRBC: 0 % (ref 0.0–0.2)

## 2020-07-02 LAB — URINALYSIS, ROUTINE W REFLEX MICROSCOPIC
Bacteria, UA: NONE SEEN
Bilirubin Urine: NEGATIVE
Glucose, UA: NEGATIVE mg/dL
Ketones, ur: NEGATIVE mg/dL
Leukocytes,Ua: NEGATIVE
Nitrite: NEGATIVE
Protein, ur: NEGATIVE mg/dL
Specific Gravity, Urine: 1.003 — ABNORMAL LOW (ref 1.005–1.030)
pH: 7 (ref 5.0–8.0)

## 2020-07-02 LAB — TYPE AND SCREEN
ABO/RH(D): A POS
Antibody Screen: NEGATIVE

## 2020-07-02 LAB — APTT: aPTT: 30 seconds (ref 24–36)

## 2020-07-02 LAB — SURGICAL PCR SCREEN
MRSA, PCR: NEGATIVE
Staphylococcus aureus: NEGATIVE

## 2020-07-02 LAB — PROTIME-INR
INR: 1.1 (ref 0.8–1.2)
Prothrombin Time: 13.7 seconds (ref 11.4–15.2)

## 2020-07-02 NOTE — Progress Notes (Signed)
PCP - Dr. Orlinda Blalock  Cardiologist - Dr. Olin Pia  Chest x-ray - 12/17/19 (E)  EKG - 03/11/20 (E)  Stress Test - 05/29/13 (E)  ECHO - 03/11/20 (E)  Cardiac Cath - 07/26/05 (E)  AICD-na PM-na LOOP-na  Sleep Study - No- Pt rescheduled for April CPAP - No  LABS- 07/02/20: CBC, CMP, PT, PTT, T/S, UA, PCR 07/06/20: COVID  ASA- Cont. Eliquis- LD-3/12  ERAS- No  HA1C- Denies  Anesthesia- Yes- medical history  Pt denies having chest pain, sob, or fever at this time. All instructions explained to the pt, with a verbal understanding of the material. Pt agrees to go over the instructions while at home for a better understanding. Pt also instructed to self quarantine after being tested for COVID-19. The opportunity to ask questions was provided.   Coronavirus Screening  Have you experienced the following symptoms:  Cough yes/no: No Fever (>100.54F)  yes/no: No Runny nose yes/no: No Sore throat yes/no: No Difficulty breathing/shortness of breath  yes/no: No  Have you or a family member traveled in the last 14 days and where? yes/no: No   If the patient indicates "YES" to the above questions, their PAT will be rescheduled to limit the exposure to others and, the surgeon will be notified. THE PATIENT WILL NEED TO BE ASYMPTOMATIC FOR 14 DAYS.   If the patient is not experiencing any of these symptoms, the PAT nurse will instruct them to NOT bring anyone with them to their appointment since they may have these symptoms or traveled as well.   Please remind your patients and families that hospital visitation restrictions are in effect and the importance of the restrictions.

## 2020-07-03 NOTE — Anesthesia Preprocedure Evaluation (Addendum)
Anesthesia Evaluation  Patient identified by MRN, date of birth, ID band Patient awake    Reviewed: Allergy & Precautions, NPO status , Patient's Chart, lab work & pertinent test results  History of Anesthesia Complications (+) PONV and history of anesthetic complications  Airway Mallampati: II  TM Distance: >3 FB Neck ROM: Full    Dental  (+) Teeth Intact, Dental Advisory Given,    Pulmonary former smoker,    Pulmonary exam normal breath sounds clear to auscultation       Cardiovascular hypertension, Pt. on medications + CAD, + CABG and + Peripheral Vascular Disease  + dysrhythmias Atrial Fibrillation + Valvular Problems/Murmurs AS and AI  Rhythm:Irregular Rate:Abnormal  Echo 03/11/20: 1. Left ventricular ejection fraction, by estimation, is 55 to 60%. The  left ventricle has normal function. The left ventricle has no regional  wall motion abnormalities. There is mild concentric left ventricular  hypertrophy. Left ventricular diastolic  parameters are indeterminate.  2. Right ventricular systolic function is moderately reduced. The right  ventricular size is mildly enlarged. There is moderately elevated  pulmonary artery systolic pressure. The estimated right ventricular  systolic pressure is 70.2 mmHg.  3. Left atrial size was moderately dilated.  4. The mitral valve is grossly normal. No evidence of mitral valve  regurgitation. Moderate mitral annular calcification.  5. Tricuspid valve regurgitation is mild to moderate.  6. The aortic valve is calcified. There is severe calcifcation of the aortic valve. Aortic valve regurgitation is moderate. Mild to moderate aortic valve stenosis.  7. There is dilatation of the ascending aorta, measuring 42 mm.  8. The inferior vena cava is dilated in size with <50% respiratory variability, suggesting right atrial pressure of 15 mmHg.   Neuro/Psych PSYCHIATRIC DISORDERS Anxiety   Neuromuscular disease CVA, Residual Symptoms    GI/Hepatic Neg liver ROS, GERD  ,  Endo/Other  negative endocrine ROS  Renal/GU negative Renal ROS     Musculoskeletal  (+) Arthritis ,   Abdominal   Peds  Hematology  (+) Blood dyscrasia (Eliquis), ,   Anesthesia Other Findings Day of surgery medications reviewed with the patient.  Reproductive/Obstetrics                          Anesthesia Physical Anesthesia Plan  ASA: III  Anesthesia Plan: General   Post-op Pain Management:    Induction: Intravenous  PONV Risk Score and Plan: 3 and Dexamethasone and Ondansetron  Airway Management Planned: Oral ETT  Additional Equipment: Arterial line  Intra-op Plan:   Post-operative Plan: Extubation in OR  Informed Consent: I have reviewed the patients History and Physical, chart, labs and discussed the procedure including the risks, benefits and alternatives for the proposed anesthesia with the patient or authorized representative who has indicated his/her understanding and acceptance.     Dental advisory given  Plan Discussed with: CRNA  Anesthesia Plan Comments: (PAT note written 07/03/2020 by Myra Gianotti, PA-C. )       Anesthesia Quick Evaluation

## 2020-07-03 NOTE — Progress Notes (Signed)
Anesthesia Chart Review:  Case: [id number] Date/Time: 07/08/20 1110   Procedure: LEFT CAROTID ENDARTERECTOMY (Left )   Anesthesia type: General   Pre-op diagnosis: Carotid Artery Stenosis   Location: [facility] OR ROOM 11 / [place] [facility]   Surgeons: Serafina Mitchell, MD      DISCUSSION: Patient is an 86 year old male scheduled for the above procedure. On 03/10/20 he was sent to the ED by ophthalmologist for left monocular superior altitudinal deficit and concern of left retinal artery occlusion, "likely Hollenhorst plaque versus embolus". CTA head/neck showed moderate chronic microvascular ischemic changes, ICA plaque (L > R), but no acute abnormality. MRI showed subcentimeter remote lacunar infarct at the right thalamus. Not a candidate for tPA given Eliquis for afib. Stroke felt more likely related to LICA carotid disease rather than afib. Dr. Trula Slade recommended left carotid endarterectomy. Patient wanted to think about his options, so surgery not scheduled until recently.   History includes former smoker (quit 04/25/80), post-operative N/V, HTN, aortic stenosis (mild-moderate AS, moderate AI, ascending aorta root 42 mm 03/11/20 echo), CAD (s/p CABG x5: LIMA-LAD, SVG-DIAG, SVG-CX, SVG-PDA-PLA 03/07/03), right ventricular outflow tract ventricular tachycardia (diagnosed 07/2005), PAF (now permanent afib), dyslipidemia, GERD, chronic cough/laryngopharyngeal reflux, bladder neoplasm (12/25/12 biopsy: benign bladder mucosa, negative for dysplasia or malignancy), CVA (left retinal artery occlusion 03/10/20; remote subcentimeter right thalamus lacunar infarct on 03/10/20 MRI).  Last seen by cardiology on 03/31/20 by Tommye Standard, PA-C. Note reviewed. No CAD symptoms. AS stable by echo. No recurrent syncope (history of postmicturition syncope). Encouraged to discuss ongoing dizziness with neurology. Persistent left eye visual deficit with known upcoming vascular surgery visit to talk about treatment options.   Last neurology  visit 04/08/20 with Frann Rider, NP. Would look into neuro-ophthalmology referral. Referred for future sleep study (reported scheduled for April). Patient had upcoming vascular surgery follow-up to discuss treatment options for ICA stenosis.  Per [facility], patient to continue ASA and hold Eliquis for 3 days prior to surgery. Last Eliquis is scheduled for 07/04/2020. Presurgical COVID-19 test is scheduled for 07/06/2020.  Anesthesia team to evaluate on the day of surgery.   VS: BP (!) 155/89   Pulse 82   Temp 36.6 C (Oral)   Resp 18   Ht _0  (1.778 m)   Wt 90.3 kg   SpO2 99%   BMI 28.57 kg/m    PROVIDERS: Tonia Ghent, MD is PCP Virl Axe, MD is cardiologist.  Rosalin Hawking, MD is neurologist.  Clent Jacks, MD is ophthalmologist Izora Gala, MD is ENT. Consult 03/27/20 for imbalance/lightheadedness and chronic LPR. No true vertigo. Symptoms not felt related to ear issues, may be related to his vascular disease. Lifestyle changes recommended for LPR.   LABS: Labs reviewed: Acceptable for surgery. A1c 5.6% 03/11/20. (all labs ordered are listed, but only abnormal results are displayed)  Labs Reviewed  CBC - Abnormal; Notable for the following components:      Result Value   RBC 4.18 (*)    All other components within normal limits  URINALYSIS, ROUTINE W REFLEX MICROSCOPIC - Abnormal; Notable for the following components:   Color, Urine STRAW (*)    Specific Gravity, Urine 1.003 (*)    Hgb urine dipstick SMALL (*)    All other components within normal limits  SURGICAL PCR SCREEN  COMPREHENSIVE METABOLIC PANEL  PROTIME-INR  APTT  TYPE AND SCREEN    PFTs 05/18/16: FVC 3.94 (95%), post 3.90 (94%). FEV1 3.14 (106%), post 3.34 (113%). DLCO unc 25.57 (74%), cor  24.42 (74%).   IMAGES: MRI Brain 03/10/20: IMPRESSION: 1. No acute intracranial abnormality. 2. Generalized age-related cerebral atrophy with chronic small vessel ischemic disease. Superimposed subcentimeter  remote lacunar infarct at the right thalamus.  CTA head/neck 03/10/20: IMPRESSION: - No acute intracranial abnormality. Moderate chronic microvascular ischemic changes. - Calcified plaque at the left greater than right ICA origins without hemodynamically significant stenosis. - No proximal intracranial vessel occlusion or significant stenosis.  CXR 12/17/19: FINDINGS: The patient is status post CABG. Heart size is normal. Aortic atherosclerosis. Lungs clear. No pneumothorax or pleural fluid. No acute or focal bony abnormality. IMPRESSION: No acute disease. Aortic Atherosclerosis (ICD10-I70.0).   EKG: 03/10/20: Atrial fibrillation at 87 bpm Nonspecific ST and T wave abnormality Prolonged QT Abnormal ECG Confirmed by Carmin Muskrat [phone]) on 03/12/2020 10:29:23 PM   CV: Echo 03/11/20: IMPRESSIONS  1. Left ventricular ejection fraction, by estimation, is 55 to 60%. The  left ventricle has normal function. The left ventricle has no regional  wall motion abnormalities. There is mild concentric left ventricular  hypertrophy. Left ventricular diastolic  parameters are indeterminate.  2. Right ventricular systolic function is moderately reduced. The right  ventricular size is mildly enlarged. There is moderately elevated  pulmonary artery systolic pressure. The estimated right ventricular  systolic pressure is 70.6 mmHg.  3. Left atrial size was moderately dilated.  4. The mitral valve is grossly normal. No evidence of mitral valve  regurgitation. Moderate mitral annular calcification.  5. Tricuspid valve regurgitation is mild to moderate.  6. The aortic valve is calcified. There is severe calcifcation of the  aortic valve. Aortic valve regurgitation is moderate. Mild to moderate  aortic valve stenosis. Aortic valve mean gradient measures 24.5 mmHg. Aortic valve peak gradient measures 42.8 mmHg. Aortic valve area, by VTI measures 1.61 cm.  7. There is dilatation of  the ascending aorta, measuring 42 mm.  8. The inferior vena cava is dilated in size with <50% respiratory  variability, suggesting right atrial pressure of 15 mmHg.  - Comparison(s): A prior study was performed on 09/25/19. Compared to prior:  stable aortic valve regurgitation and stenosis, stable ascending aortic  size, stable RV function, stable mitral calcifications, slight increase in  tricuspid regurgitation.    Nuclear stress test 05/29/13: Overall Impression:  Normal stress nuclear study. LV Ejection Fraction: 61%.  LV Wall Motion:  NL LV Function; NL Wall Motion   Cardiac event monitor 09/20/11-10/12/11: Afib with PVCs.   EP Study 07/27/05: CONCLUSION:  This study failed to result in inducible ventricular tachycardia or supraventricular tachycardia in a patient with a history of nonsustained ventricular tachycardia and coronary disease.  As a result, the above study strongly suggests a diagnosis of right ventricular outflow tract ventricular tachycardia and essentially rules out more malignant causes of ventricular tachycardia. RECOMMENDATION:  Recommendation will be up-titrate beta blockers and initiate calcium channel blockers as needed for control of symptoms.   Cardiac cath 07/26/05: FINAL DIAGNOSIS: 1.  Nonsustained VT recurrent leading to hospitalization for tachy      palpitations. 2.  Three-vessel coronary artery disease as described above. 3.  Widely patent grafts to:      1.  LIMA to LAD.      2.  Saphenous vein graft OM two.      3.  Patent saphenous vein graft to right coronary artery sequential and          patent saphenous vein graft to diagonal. 4.  LV EF 50%. PLAN:  The patient  will have an EP consult, continue Amiodarone loading, and be considered for an internal defibrillator.   Past Medical History:  Diagnosis Date  . Anticoagulated on Coumadin    followed in [facility] at [facility]  . Aortic stenosis    a. Echo 2/15: Severe LVH, EF 55-60%, Gr 1 DD,  mild to mod AS (mean 20 mmHg, peak 31 mmHg), AVA 1.4-1.5 cm2, mild AI, MAC, trivial MR, LA upper limits of normal, RVSP 22 mmHg, mild RAE;   b. Echo 2/16:  EF 55-60%, no RWMA, mild AS (mean 17 mmHg, peak 27 mmHg), AVA 1.5 cm2, mild AI, MAC, mild LAE  . Arthritis   . Bladder neoplasm   . Chronic cough    NO CARDIAC OR PULMONARY RELATED  . Complication of anesthesia   . Coronary artery disease CARDIOLOGIST-  DR KLEIN/ ALLRED   a. s/p CABG 2004, b. LHC with patent grafts 07/2005, ejection fraction 50%;  c. Myoview 2/15: no ischemia, EF 61%  . Dry eyes   . Dyslipidemia   . GERD (gastroesophageal reflux disease)   . HTN (hypertension)   . Mild aortic stenosis   . PAF (paroxysmal atrial fibrillation) ([place])    CHADS2-VASc:  4  . Paroxysmal atrial fibrillation ([facility])   . PONV (postoperative nausea and vomiting)    From having surgery back in the 1950's  . RVOT-VT (right ventricular outflow tract ventricular tachycardia) ([place])    a. Amiodarone Rx  . S/P CABG x 5 2004  . Stroke ([place]) 03/10/2020   Stroke in the Left eye    Past Surgical History:  Procedure Laterality Date  . CARDIAC CATHETERIZATION  07-26-2005  DR GAMBLE   PRESERVED LVF/  EF 50%/ PATENT GRAFTS  . CARDIAC CATHETERIZATION  03-04-2003  DR GAMBLE   SEVERE 3 VESSEL DISEASE  . [place]   PAF/ FALSE POSITIVE STRESS TEST  . CATARACT EXTRACTION W/ INTRAOCULAR LENS IMPLANT Right   . CORONARY ARTERY BYPASS GRAFT  03-08-2003  DR [id number]   LIMA TO LAD/ SVG TO OM2/  SVG TO DIAGONAL / SVG TO PDA & PLA  . CYSTOSCOPY WITH BIOPSY N/A 12/25/2012   Procedure: CYSTOSCOPY WITH BLADDER BIOPSY;  Surgeon: Fredricka Bonine, MD;  Location: [facility];  Service: Urology;  Laterality: N/A;  . ELECTROPHYSIOLOGY STUDY  07-27-2005  DR Carleene Overlie TAYLOR   MAPPING --   RESULT NONINDUCIBLE VT OR SVT/  DX RIGHT VENTRICULAR OUTFLOW TRACT VT AND RULES OUT MORE MALIGNANT CAUSES OF VT  . INGUINAL HERNIA REPAIR  Bilateral [place]  . KNEE ARTHROSCOPY Right 1994  . LUMBAR DISC SURGERY  1999   L4 -- L5  . MOHS SURGERY     by Dr. Levada Dy 2019  . REMOVAL VOCAL CORD POLYPS  1978  . TONSILLECTOMY  AS CHILD  . TRANSTHORACIC ECHOCARDIOGRAM  08-08-2011   MILD LVH/  EF 02-58%/  GRADE I DIASTOLIC DYSFUNCTION/ MILD AV STENOSIS    MEDICATIONS: . acetaminophen (TYLENOL) 325 MG tablet  . apixaban (ELIQUIS) 5 MG TABS tablet  . aspirin EC 81 MG tablet  . bimatoprost (LUMIGAN) 0.01 % SOLN  . butalbital-acetaminophen-caffeine (FIORICET, ESGIC) 50-325-40 MG per tablet  . diltiazem (CARDIZEM CD) 120 MG 24 hr capsule  . fluticasone (FLONASE) 50 MCG/ACT nasal spray  . hydrochlorothiazide (HYDRODIURIL) 12.5 MG tablet  . loratadine (CLARITIN) 10 MG tablet  . meclizine (ANTIVERT) 25 MG tablet  . Multiple Vitamins-Minerals (MULTIVITAMIN WITH MINERALS) tablet  .  Polyethyl Glycol-Propyl Glycol (SYSTANE ULTRA OP)  . potassium chloride (K-DUR) 10 MEQ tablet  . pravastatin (PRAVACHOL) 80 MG tablet   No current facility-administered medications for this encounter.    Myra Gianotti, PA-C Surgical Short Stay/Anesthesiology [facility] Phone [phone] [facility] Phone [phone] 07/03/2020 11:32 AM

## 2020-07-06 ENCOUNTER — Other Ambulatory Visit (HOSPITAL_COMMUNITY)
Admission: RE | Admit: 2020-07-06 | Discharge: 2020-07-06 | Disposition: A | Discharge status: Home / Self Care | Payer: Medicare Other | Source: Ambulatory Visit | Attending: Surgery | Admitting: Surgery

## 2020-07-06 DIAGNOSIS — Z20822 Contact with and (suspected) exposure to covid-19: Secondary | ICD-10-CM | POA: Insufficient documentation

## 2020-07-06 DIAGNOSIS — Z01812 Encounter for preprocedural laboratory examination: Secondary | ICD-10-CM | POA: Insufficient documentation

## 2020-07-06 LAB — SARS CORONAVIRUS 2 (TAT 6-24 HRS): SARS Coronavirus 2: NEGATIVE

## 2020-07-08 ENCOUNTER — Encounter (HOSPITAL_COMMUNITY): Payer: Self-pay | Admitting: Surgery

## 2020-07-08 ENCOUNTER — Inpatient Hospital Stay (HOSPITAL_COMMUNITY): Payer: Medicare Other | Admitting: Physician Assistant

## 2020-07-08 ENCOUNTER — Other Ambulatory Visit: Payer: Self-pay

## 2020-07-08 ENCOUNTER — Encounter (HOSPITAL_COMMUNITY)
Admission: RE | Disposition: A | Discharge status: Home / Self Care | Payer: Self-pay | Source: Home / Self Care | Attending: Surgery

## 2020-07-08 ENCOUNTER — Inpatient Hospital Stay (HOSPITAL_COMMUNITY)
Admission: RE | Admit: 2020-07-08 | Discharge: 2020-07-09 | DRG: 038 | Disposition: A | Discharge status: Home / Self Care | Payer: Medicare Other | Attending: Surgery | Admitting: Surgery

## 2020-07-08 ENCOUNTER — Inpatient Hospital Stay (HOSPITAL_COMMUNITY): Payer: Medicare Other | Admitting: Anesthesiology

## 2020-07-08 DIAGNOSIS — Z951 Presence of aortocoronary bypass graft: Secondary | ICD-10-CM

## 2020-07-08 DIAGNOSIS — Z809 Family history of malignant neoplasm, unspecified: Secondary | ICD-10-CM | POA: Diagnosis not present

## 2020-07-08 DIAGNOSIS — Z888 Allergy status to other drugs, medicaments and biological substances status: Secondary | ICD-10-CM

## 2020-07-08 DIAGNOSIS — K219 Gastro-esophageal reflux disease without esophagitis: Secondary | ICD-10-CM | POA: Diagnosis not present

## 2020-07-08 DIAGNOSIS — I2510 Atherosclerotic heart disease of native coronary artery without angina pectoris: Secondary | ICD-10-CM | POA: Diagnosis present

## 2020-07-08 DIAGNOSIS — Z881 Allergy status to other antibiotic agents status: Secondary | ICD-10-CM | POA: Diagnosis not present

## 2020-07-08 DIAGNOSIS — H349 Unspecified retinal vascular occlusion: Secondary | ICD-10-CM | POA: Diagnosis not present

## 2020-07-08 DIAGNOSIS — I10 Essential (primary) hypertension: Secondary | ICD-10-CM | POA: Diagnosis present

## 2020-07-08 DIAGNOSIS — Z87891 Personal history of nicotine dependence: Secondary | ICD-10-CM | POA: Diagnosis not present

## 2020-07-08 DIAGNOSIS — I480 Paroxysmal atrial fibrillation: Secondary | ICD-10-CM | POA: Diagnosis present

## 2020-07-08 DIAGNOSIS — E785 Hyperlipidemia, unspecified: Secondary | ICD-10-CM | POA: Diagnosis not present

## 2020-07-08 DIAGNOSIS — Z7901 Long term (current) use of anticoagulants: Secondary | ICD-10-CM | POA: Diagnosis not present

## 2020-07-08 DIAGNOSIS — I63231 Cerebral infarction due to unspecified occlusion or stenosis of right carotid arteries: Secondary | ICD-10-CM | POA: Diagnosis present

## 2020-07-08 DIAGNOSIS — I6522 Occlusion and stenosis of left carotid artery: Secondary | ICD-10-CM | POA: Diagnosis present

## 2020-07-08 DIAGNOSIS — Z8673 Personal history of transient ischemic attack (TIA), and cerebral infarction without residual deficits: Secondary | ICD-10-CM | POA: Diagnosis not present

## 2020-07-08 DIAGNOSIS — Z91018 Allergy to other foods: Secondary | ICD-10-CM

## 2020-07-08 DIAGNOSIS — Z20822 Contact with and (suspected) exposure to covid-19: Secondary | ICD-10-CM | POA: Diagnosis present

## 2020-07-08 DIAGNOSIS — Z79899 Other long term (current) drug therapy: Secondary | ICD-10-CM | POA: Diagnosis not present

## 2020-07-08 DIAGNOSIS — E7800 Pure hypercholesterolemia, unspecified: Secondary | ICD-10-CM | POA: Diagnosis present

## 2020-07-08 DIAGNOSIS — H538 Other visual disturbances: Secondary | ICD-10-CM | POA: Diagnosis present

## 2020-07-08 DIAGNOSIS — Z885 Allergy status to narcotic agent status: Secondary | ICD-10-CM | POA: Diagnosis not present

## 2020-07-08 DIAGNOSIS — Z7982 Long term (current) use of aspirin: Secondary | ICD-10-CM

## 2020-07-08 HISTORY — PX: ENDARTERECTOMY: SHX5162

## 2020-07-08 LAB — CBC
HCT: 37.9 % — ABNORMAL LOW (ref 39.0–52.0)
Hemoglobin: 13.1 g/dL (ref 13.0–17.0)
MCH: 32.1 pg (ref 26.0–34.0)
MCHC: 34.6 g/dL (ref 30.0–36.0)
MCV: 92.9 fL (ref 80.0–100.0)
Platelets: 188 10*3/uL (ref 150–400)
RBC: 4.08 MIL/uL — ABNORMAL LOW (ref 4.22–5.81)
RDW: 13.5 % (ref 11.5–15.5)
WBC: 6.4 10*3/uL (ref 4.0–10.5)
nRBC: 0 % (ref 0.0–0.2)

## 2020-07-08 LAB — ABO/RH: ABO/RH(D): A POS

## 2020-07-08 LAB — POCT ACTIVATED CLOTTING TIME: Activated Clotting Time: 249 seconds

## 2020-07-08 LAB — CREATININE, SERUM
Creatinine, Ser: 0.87 mg/dL (ref 0.61–1.24)
GFR, Estimated: >60 mL/min (ref >60)

## 2020-07-08 SURGERY — ENDARTERECTOMY, CAROTID
Anesthesia: General | Laterality: Left

## 2020-07-08 MED ORDER — ONDANSETRON HCL 4 MG/2ML IJ SOLN: 4.0000 mg | Freq: Once | INTRAMUSCULAR | Status: DC | PRN

## 2020-07-08 MED ORDER — DOCUSATE SODIUM 100 MG PO CAPS
100.0000 mg | ORAL_CAPSULE | Freq: Every day | ORAL | Status: DC
  Administered 2020-07-09: 100 mg via ORAL
  Filled 2020-07-08: qty 1

## 2020-07-08 MED ORDER — PHENYLEPHRINE HCL-NACL 10-0.9 MG/250ML-% IV SOLN
INTRAVENOUS | Status: DC | PRN
  Administered 2020-07-08: 25 ug/min via INTRAVENOUS

## 2020-07-08 MED ORDER — ALUM & MAG HYDROXIDE-SIMETH 200-200-20 MG/5ML PO SUSP: 15.0000 mL | ORAL | Status: DC | PRN

## 2020-07-08 MED ORDER — GUAIFENESIN-DM 100-10 MG/5ML PO SYRP: 15.0000 mL | ORAL_SOLUTION | ORAL | Status: DC | PRN

## 2020-07-08 MED ORDER — PANTOPRAZOLE SODIUM 40 MG PO TBEC
40.0000 mg | DELAYED_RELEASE_TABLET | Freq: Every day | ORAL | Status: DC
  Administered 2020-07-08 – 2020-07-09 (×2): 40 mg via ORAL
  Filled 2020-07-08 (×2): qty 1

## 2020-07-08 MED ORDER — ESMOLOL HCL 100 MG/10ML IV SOLN
INTRAVENOUS | Status: DC | PRN
  Administered 2020-07-08 (×2): 20 mg via INTRAVENOUS

## 2020-07-08 MED ORDER — ACETAMINOPHEN 500 MG PO TABS: 1000.0000 mg | ORAL_TABLET | Freq: Once | ORAL | Status: AC

## 2020-07-08 MED ORDER — CHLORHEXIDINE GLUCONATE CLOTH 2 % EX PADS: 6.0000 | MEDICATED_PAD | Freq: Once | CUTANEOUS | Status: DC

## 2020-07-08 MED ORDER — LABETALOL HCL 5 MG/ML IV SOLN: 10.0000 mg | INTRAVENOUS | Status: DC | PRN

## 2020-07-08 MED ORDER — DILTIAZEM HCL ER COATED BEADS 120 MG PO CP24
120.0000 mg | ORAL_CAPSULE | Freq: Every day | ORAL | Status: DC
  Administered 2020-07-09: 120 mg via ORAL
  Filled 2020-07-08: qty 1

## 2020-07-08 MED ORDER — SODIUM CHLORIDE 0.9 % IV SOLN
INTRAVENOUS | Status: DC | PRN
  Administered 2020-07-08: 500 mL

## 2020-07-08 MED ORDER — SODIUM CHLORIDE 0.9 % IV SOLN: 500.0000 mL | Freq: Once | INTRAVENOUS | Status: DC | PRN

## 2020-07-08 MED ORDER — FENTANYL CITRATE (PF) 250 MCG/5ML IJ SOLN
INTRAMUSCULAR | Status: AC
  Filled 2020-07-08: qty 5

## 2020-07-08 MED ORDER — LACTATED RINGERS IV SOLN: INTRAVENOUS | Status: DC

## 2020-07-08 MED ORDER — HYDRALAZINE HCL 20 MG/ML IJ SOLN
5.0000 mg | INTRAMUSCULAR | Status: DC | PRN
  Administered 2020-07-08: 5 mg via INTRAVENOUS
  Filled 2020-07-08: qty 1

## 2020-07-08 MED ORDER — LIDOCAINE 2% (20 MG/ML) 5 ML SYRINGE
INTRAMUSCULAR | Status: DC | PRN
  Administered 2020-07-08: 100 mg via INTRAVENOUS

## 2020-07-08 MED ORDER — VANCOMYCIN HCL IN DEXTROSE 1-5 GM/200ML-% IV SOLN
INTRAVENOUS | Status: AC
  Administered 2020-07-08: 1000 mg via INTRAVENOUS
  Filled 2020-07-08: qty 200

## 2020-07-08 MED ORDER — SODIUM CHLORIDE 0.9 % IV SOLN
0.0125 ug/kg/min | INTRAVENOUS | Status: DC
  Filled 2020-07-08: qty 2000

## 2020-07-08 MED ORDER — PROPOFOL 10 MG/ML IV BOLUS
INTRAVENOUS | Status: AC
  Filled 2020-07-08: qty 20

## 2020-07-08 MED ORDER — FENTANYL CITRATE (PF) 250 MCG/5ML IJ SOLN
INTRAMUSCULAR | Status: DC | PRN
  Administered 2020-07-08: 100 ug via INTRAVENOUS
  Administered 2020-07-08 (×2): 25 ug via INTRAVENOUS
  Administered 2020-07-08: 50 ug via INTRAVENOUS

## 2020-07-08 MED ORDER — PRAVASTATIN SODIUM 40 MG PO TABS
80.0000 mg | ORAL_TABLET | Freq: Every day | ORAL | Status: DC
  Administered 2020-07-08 – 2020-07-09 (×2): 80 mg via ORAL
  Filled 2020-07-08 (×2): qty 2

## 2020-07-08 MED ORDER — 0.9 % SODIUM CHLORIDE (POUR BTL) OPTIME
TOPICAL | Status: DC | PRN
  Administered 2020-07-08: 2000 mL

## 2020-07-08 MED ORDER — HEPARIN SODIUM (PORCINE) 1000 UNIT/ML IJ SOLN
INTRAMUSCULAR | Status: DC | PRN
  Administered 2020-07-08: 9000 [IU] via INTRAVENOUS

## 2020-07-08 MED ORDER — SODIUM CHLORIDE 0.9 % IV SOLN
INTRAVENOUS | Status: AC
  Filled 2020-07-08: qty 1.2

## 2020-07-08 MED ORDER — HEPARIN SODIUM (PORCINE) 5000 UNIT/ML IJ SOLN
5000.0000 [IU] | Freq: Three times a day (TID) | INTRAMUSCULAR | Status: DC
  Administered 2020-07-09: 5000 [IU] via SUBCUTANEOUS
  Filled 2020-07-08: qty 1

## 2020-07-08 MED ORDER — LORATADINE 10 MG PO TABS
10.0000 mg | ORAL_TABLET | Freq: Every day | ORAL | Status: DC
  Administered 2020-07-08 – 2020-07-09 (×2): 10 mg via ORAL
  Filled 2020-07-08 (×2): qty 1

## 2020-07-08 MED ORDER — VANCOMYCIN HCL 1000 MG/200ML IV SOLN
1000.0000 mg | Freq: Two times a day (BID) | INTRAVENOUS | Status: AC
  Administered 2020-07-09: 1000 mg via INTRAVENOUS
  Filled 2020-07-08: qty 200

## 2020-07-08 MED ORDER — ORAL CARE MOUTH RINSE: 15.0000 mL | Freq: Once | OROMUCOSAL | Status: AC

## 2020-07-08 MED ORDER — DILTIAZEM HCL ER COATED BEADS 120 MG PO CP24: 120.0000 mg | ORAL_CAPSULE | Freq: Every day | ORAL | Status: DC

## 2020-07-08 MED ORDER — ASPIRIN EC 81 MG PO TBEC
81.0000 mg | DELAYED_RELEASE_TABLET | Freq: Every day | ORAL | Status: DC
  Administered 2020-07-08 – 2020-07-09 (×2): 81 mg via ORAL
  Filled 2020-07-08 (×2): qty 1

## 2020-07-08 MED ORDER — FENTANYL CITRATE (PF) 100 MCG/2ML IJ SOLN
INTRAMUSCULAR | Status: AC
  Filled 2020-07-08: qty 2

## 2020-07-08 MED ORDER — CHLORHEXIDINE GLUCONATE CLOTH 2 % EX PADS
6.0000 | MEDICATED_PAD | Freq: Once | CUTANEOUS | Status: DC
  Administered 2020-07-08: 6 via TOPICAL

## 2020-07-08 MED ORDER — ONDANSETRON HCL 4 MG/2ML IJ SOLN: 4.0000 mg | Freq: Four times a day (QID) | INTRAMUSCULAR | Status: DC | PRN

## 2020-07-08 MED ORDER — DEXAMETHASONE SODIUM PHOSPHATE 10 MG/ML IJ SOLN
INTRAMUSCULAR | Status: DC | PRN
  Administered 2020-07-08: 10 mg via INTRAVENOUS

## 2020-07-08 MED ORDER — POTASSIUM CHLORIDE CRYS ER 20 MEQ PO TBCR: 20.0000 meq | EXTENDED_RELEASE_TABLET | Freq: Every day | ORAL | Status: DC | PRN

## 2020-07-08 MED ORDER — MAGNESIUM SULFATE 2 GM/50ML IV SOLN: 2.0000 g | Freq: Every day | INTRAVENOUS | Status: DC | PRN

## 2020-07-08 MED ORDER — PHENYLEPHRINE HCL (PRESSORS) 10 MG/ML IV SOLN
INTRAVENOUS | Status: DC | PRN
  Administered 2020-07-08 (×2): 80 ug via INTRAVENOUS

## 2020-07-08 MED ORDER — PHENOL 1.4 % MT LIQD: 1.0000 | OROMUCOSAL | Status: DC | PRN

## 2020-07-08 MED ORDER — LABETALOL HCL 5 MG/ML IV SOLN
INTRAVENOUS | Status: AC
  Filled 2020-07-08: qty 4

## 2020-07-08 MED ORDER — HEMOSTATIC AGENTS (NO CHARGE) OPTIME
TOPICAL | Status: DC | PRN
  Administered 2020-07-08: 1 via TOPICAL

## 2020-07-08 MED ORDER — METOPROLOL TARTRATE 5 MG/5ML IV SOLN
2.0000 mg | INTRAVENOUS | Status: DC | PRN
Start: 2020-07-08 — End: 2020-07-09

## 2020-07-08 MED ORDER — CEFAZOLIN SODIUM-DEXTROSE 2-4 GM/100ML-% IV SOLN: 2.0000 g | Freq: Three times a day (TID) | INTRAVENOUS | Status: DC

## 2020-07-08 MED ORDER — PROTAMINE SULFATE 10 MG/ML IV SOLN
INTRAVENOUS | Status: DC | PRN
  Administered 2020-07-08: 50 mg via INTRAVENOUS

## 2020-07-08 MED ORDER — ROCURONIUM BROMIDE 10 MG/ML (PF) SYRINGE
PREFILLED_SYRINGE | INTRAVENOUS | Status: DC | PRN
  Administered 2020-07-08: 60 mg via INTRAVENOUS
  Administered 2020-07-08: 20 mg via INTRAVENOUS

## 2020-07-08 MED ORDER — EPHEDRINE SULFATE 50 MG/ML IJ SOLN
INTRAMUSCULAR | Status: DC | PRN
  Administered 2020-07-08: 10 mg via INTRAVENOUS

## 2020-07-08 MED ORDER — ACETAMINOPHEN 325 MG PO TABS
325.0000 mg | ORAL_TABLET | ORAL | Status: DC | PRN
  Administered 2020-07-08: 325 mg via ORAL
  Administered 2020-07-08: 650 mg via ORAL
  Filled 2020-07-08 (×2): qty 2

## 2020-07-08 MED ORDER — CHLORHEXIDINE GLUCONATE 0.12 % MT SOLN
OROMUCOSAL | Status: AC
  Administered 2020-07-08: 15 mL via OROMUCOSAL
  Filled 2020-07-08: qty 15

## 2020-07-08 MED ORDER — ACETAMINOPHEN 650 MG RE SUPP: 325.0000 mg | RECTAL | Status: DC | PRN

## 2020-07-08 MED ORDER — REMIFENTANIL HCL 2 MG IV SOLR
INTRAVENOUS | Status: DC | PRN
  Administered 2020-07-08: .2 ug/kg/min via INTRAVENOUS

## 2020-07-08 MED ORDER — FENTANYL CITRATE (PF) 100 MCG/2ML IJ SOLN
25.0000 ug | INTRAMUSCULAR | Status: DC | PRN
  Administered 2020-07-08: 25 ug via INTRAVENOUS

## 2020-07-08 MED ORDER — CHLORHEXIDINE GLUCONATE 0.12 % MT SOLN: 15.0000 mL | Freq: Once | OROMUCOSAL | Status: AC

## 2020-07-08 MED ORDER — SODIUM CHLORIDE 0.9 % IV SOLN: INTRAVENOUS | Status: DC

## 2020-07-08 MED ORDER — ONDANSETRON HCL 4 MG/2ML IJ SOLN
INTRAMUSCULAR | Status: DC | PRN
  Administered 2020-07-08: 4 mg via INTRAVENOUS

## 2020-07-08 MED ORDER — VANCOMYCIN HCL IN DEXTROSE 1-5 GM/200ML-% IV SOLN
1000.0000 mg | INTRAVENOUS | Status: AC
  Administered 2020-07-08: 1000 mg via INTRAVENOUS

## 2020-07-08 MED ORDER — PROPOFOL 10 MG/ML IV BOLUS
INTRAVENOUS | Status: DC | PRN
  Administered 2020-07-08: 50 mg via INTRAVENOUS
  Administered 2020-07-08: 20 mg via INTRAVENOUS
  Administered 2020-07-08: 50 mg via INTRAVENOUS
  Administered 2020-07-08: 30 mg via INTRAVENOUS

## 2020-07-08 MED ORDER — OXYCODONE-ACETAMINOPHEN 5-325 MG PO TABS: 1.0000 | ORAL_TABLET | ORAL | Status: DC | PRN

## 2020-07-08 MED ORDER — HYDROMORPHONE HCL 1 MG/ML IJ SOLN: 0.5000 mg | INTRAMUSCULAR | Status: DC | PRN

## 2020-07-08 MED ORDER — HYDROCHLOROTHIAZIDE 25 MG PO TABS
12.5000 mg | ORAL_TABLET | Freq: Every day | ORAL | Status: DC
  Administered 2020-07-08 – 2020-07-09 (×2): 12.5 mg via ORAL
  Filled 2020-07-08 (×2): qty 1

## 2020-07-08 MED ORDER — ACETAMINOPHEN 500 MG PO TABS
ORAL_TABLET | ORAL | Status: AC
  Administered 2020-07-08: 1000 mg via ORAL
  Filled 2020-07-08: qty 2

## 2020-07-08 SURGICAL SUPPLY — 51 items
ADH SKN CLS APL DERMABOND .7 (GAUZE/BANDAGES/DRESSINGS) ×1
AGENT HMST KT MTR STRL THRMB (HEMOSTASIS) ×1
CANISTER SUCT 3000ML PPV (MISCELLANEOUS) ×3 IMPLANT
CATH ROBINSON RED A/P 18FR (CATHETERS) ×3 IMPLANT
CATH SUCT 10FR WHISTLE TIP (CATHETERS) ×3 IMPLANT
CLIP VESOCCLUDE MED 6/CT (CLIP) ×3 IMPLANT
CLIP VESOCCLUDE SM WIDE 6/CT (CLIP) ×3 IMPLANT
COVER PROBE W GEL 5X96 (DRAPES) ×2 IMPLANT
COVER WAND RF STERILE (DRAPES) ×1 IMPLANT
DERMABOND ADVANCED (GAUZE/BANDAGES/DRESSINGS) ×2
DERMABOND ADVANCED .7 DNX12 (GAUZE/BANDAGES/DRESSINGS) ×1 IMPLANT
DRAIN CHANNEL 15F RND FF W/TCR (WOUND CARE) IMPLANT
ELECT REM PT RETURN 9FT ADLT (ELECTROSURGICAL) ×3
ELECTRODE REM PT RTRN 9FT ADLT (ELECTROSURGICAL) ×1 IMPLANT
EVACUATOR SILICONE 100CC (DRAIN) IMPLANT
GLOVE BIOGEL PI IND STRL 7.5 (GLOVE) ×1 IMPLANT
GLOVE BIOGEL PI INDICATOR 7.5 (GLOVE) ×2
GLOVE SURG SS PI 7.5 STRL IVOR (GLOVE) ×3 IMPLANT
GOWN STRL REUS W/ TWL LRG LVL3 (GOWN DISPOSABLE) ×2 IMPLANT
GOWN STRL REUS W/ TWL XL LVL3 (GOWN DISPOSABLE) ×1 IMPLANT
GOWN STRL REUS W/TWL LRG LVL3 (GOWN DISPOSABLE) ×6
GOWN STRL REUS W/TWL XL LVL3 (GOWN DISPOSABLE) ×3
HEMOSTAT SNOW SURGICEL 2X4 (HEMOSTASIS) IMPLANT
INSERT FOGARTY SM (MISCELLANEOUS) IMPLANT
KIT BASIN OR (CUSTOM PROCEDURE TRAY) ×3 IMPLANT
KIT SHUNT ARGYLE CAROTID ART 6 (VASCULAR PRODUCTS) IMPLANT
KIT TURNOVER KIT B (KITS) ×3 IMPLANT
LOOP VESSEL MAXI BLUE (MISCELLANEOUS) ×2 IMPLANT
NDL HYPO 25GX1X1/2 BEV (NEEDLE) IMPLANT
NEEDLE HYPO 25GX1X1/2 BEV (NEEDLE) IMPLANT
NS IRRIG 1000ML POUR BTL (IV SOLUTION) ×7 IMPLANT
PACK CAROTID (CUSTOM PROCEDURE TRAY) ×3 IMPLANT
PAD ARMBOARD 7.5X6 YLW CONV (MISCELLANEOUS) ×6 IMPLANT
PATCH VASC XENOSURE 1CMX6CM (Vascular Products) ×3 IMPLANT
PATCH VASC XENOSURE 1X6 (Vascular Products) IMPLANT
POSITIONER HEAD DONUT 9IN (MISCELLANEOUS) ×3 IMPLANT
SET WALTER ACTIVATION W/DRAPE (SET/KITS/TRAYS/PACK) IMPLANT
SHUNT CAROTID BYPASS 10 (VASCULAR PRODUCTS) IMPLANT
SHUNT CAROTID BYPASS 12FRX15.5 (VASCULAR PRODUCTS) IMPLANT
SURGIFLO W/THROMBIN 8M KIT (HEMOSTASIS) ×2 IMPLANT
SUT ETHILON 3 0 PS 1 (SUTURE) IMPLANT
SUT PROLENE 6 0 BV (SUTURE) ×6 IMPLANT
SUT PROLENE 7 0 BV1 MDA (SUTURE) ×2 IMPLANT
SUT SILK 3 0 (SUTURE)
SUT SILK 3-0 18XBRD TIE 12 (SUTURE) IMPLANT
SUT VIC AB 3-0 SH 27 (SUTURE) ×6
SUT VIC AB 3-0 SH 27X BRD (SUTURE) ×2 IMPLANT
SUT VIC AB 3-0 X1 27 (SUTURE) ×3 IMPLANT
SYR CONTROL 10ML LL (SYRINGE) IMPLANT
TOWEL GREEN STERILE (TOWEL DISPOSABLE) ×3 IMPLANT
WATER STERILE IRR 1000ML POUR (IV SOLUTION) ×3 IMPLANT

## 2020-07-08 NOTE — Anesthesia Procedure Notes (Signed)
Arterial Line Insertion Start/End3/16/2022 10:15 AM, 07/08/2020 10:20 AM Performed by: Rande Brunt, CRNA, CRNA  Patient location: Pre-op. Preanesthetic checklist: patient identified, IV checked, site marked, risks and benefits discussed, surgical consent, monitors and equipment checked, pre-op evaluation, timeout performed and anesthesia consent Lidocaine 1% used for infiltration Right, radial was placed Catheter size: 20 G Hand hygiene performed  and maximum sterile barriers used   Attempts: 1 Procedure performed without using ultrasound guided technique. Following insertion, dressing applied and Biopatch. Post procedure assessment: normal and unchanged  Patient tolerated the procedure well with no immediate complications.

## 2020-07-08 NOTE — Discharge Instructions (Signed)
   [facility]   Discharge Instructions   Carotid Surgery  Please refer to the following instructions for your post-procedure care. Your surgeon or physician assistant will discuss any changes with you.  Activity  You are encouraged to walk as much as you can. You can slowly return to normal activities but must avoid strenuous activity and heavy lifting until your doctor tell you it's okay. Avoid activities such as vacuuming or swinging a golf club. You can drive after one week if you are comfortable and you are no longer taking prescription pain medications. It is normal to feel tired for serval weeks after your surgery. It is also normal to have difficulty with sleep habits, eating, and bowel movements after surgery. These will go away with time.  Bathing/Showering  Shower daily after you go home. Do not soak in a bathtub, hot tub, or swim until the incision heals completely.  Incision Care  Shower every day. Clean your incision with mild soap and water. Pat the area dry with a clean towel. You do not need a bandage unless otherwise instructed. Do not apply any ointments or creams to your incision. You may have skin glue on your incision. Do not peel it off. It will come off on its own in about one week. Your incision may feel thickened and raised for several weeks after your surgery. This is normal and the skin will soften over time.   For Men Only: It's okay to shave around the incision but do not shave the incision itself for 2 weeks. It is common to have numbness under your chin that could last for several months.  Diet  Resume your normal diet. There are no special food restrictions following this procedure. A low fat/low cholesterol diet is recommended for all patients with vascular disease. In order to heal from your surgery, it is CRITICAL to get adequate nutrition. Your body requires vitamins, minerals, and protein. Vegetables are the best source of  vitamins and minerals. Vegetables also provide the perfect balance of protein. Processed food has little nutritional value, so try to avoid this.  Medications  Resume taking all of your medications unless your doctor or physician assistant tells you not to. If your incision is causing pain, you may take over-the- counter pain relievers such as acetaminophen (Tylenol). If you were prescribed a stronger pain medication, please be aware these medications can cause nausea and constipation. Prevent nausea by taking the medication with a snack or meal. Avoid constipation by drinking plenty of fluids and eating foods with a high amount of fiber, such as fruits, vegetables, and grains.   Do not take Tylenol if you are taking prescription pain medications.  Follow Up  Our office will schedule a follow up appointment 2-3 weeks following discharge.  Please call us immediately for any of the following conditions  . Increased pain, redness, drainage (pus) from your incision site. . Fever of 101 degrees or higher. . If you should develop stroke (slurred speech, difficulty swallowing, weakness on one side of your body, loss of vision) you should call 911 and go to the nearest emergency room. .  Reduce your risk of vascular disease:  . Stop smoking. If you would like help call [facility] at 1-800-QUIT-NOW (1-800-784-8669) or [place] at 336-586-4000. . Manage your cholesterol . Maintain a desired weight . Control your diabetes . Keep your blood pressure down .  If you have any questions, please call the office at [phone]. 

## 2020-07-08 NOTE — H&P (Signed)
[facility] [place]  Patient name: TAESEAN RETH MRN: [id number]        DOB: 1934-05-19          Sex: male   REASON FOR VISIT:    Follow-up  HISOTRY OF PRESENT ILLNESS:    MENDEL BINSFELD is a 86 y.o. male who I was consulted on in the hospital on 03/11/2020.  The patient presented with left eye vision loss.  He was seen by his ophthalmologist who diagnosed left retinal artery occlusion.  He was sent to the hospital and admitted.  He had a CT angiogram that showed soft and calcific plaque at his left carotid bifurcation.  The stenosis was felt to be 50%.  In discussions with the stroke service, the etiology for his stroke was either his A. fib or the carotid lesion, and we both felt that the carotid lesion was the most likely culprit.  He did have a echocardiogram that was negative for intracardiac source.  His ejection fraction was 55-60%.  The patient did not have any other neurologic symptoms.  He did not receive TPA.  The patient's carotid artery was noted to be very tortuous, and I felt his best option would be carotid endarterectomy.  The patient wanted to go home to further think about this.  He is back today for follow-up.  He has not had any new symptoms.  The patient suffers from atrial fibrillation for which he is on Eliquis.  He takes a statin for hypercholesterolemia.  His most recent LDL was 85.  He has a history of coronary artery disease, status post CABG.   PAST MEDICAL HISTORY:       Past Medical History:  Diagnosis Date  . Anticoagulated on Coumadin    followed in [facility] at [facility]  . Aortic stenosis    a. Echo 2/15: Severe LVH, EF 55-60%, Gr 1 DD, mild to mod AS (mean 20 mmHg, peak 31 mmHg), AVA 1.4-1.5 cm2, mild AI, MAC, trivial MR, LA upper limits of normal, RVSP 22 mmHg, mild RAE;   b. Echo 2/16:  EF 55-60%, no RWMA, mild AS (mean 17 mmHg, peak 27 mmHg), AVA 1.5 cm2, mild AI, MAC,  mild LAE  . Arthritis   . Bladder neoplasm   . Chronic cough    NO CARDIAC OR PULMONARY RELATED  . Coronary artery disease CARDIOLOGIST-  DR KLEIN/ [facility]   a. s/p CABG 2004, b. LHC with patent grafts 07/2005, ejection fraction 50%;  c. Myoview 2/15: no ischemia, EF 61%  . Dry eyes   . Dyslipidemia   . GERD (gastroesophageal reflux disease)   . HTN (hypertension)   . Mild aortic stenosis   . PAF (paroxysmal atrial fibrillation) ([place])    CHADS2-VASc:  4  . Paroxysmal atrial fibrillation (HCC)   . 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 Amiodarone Rx  . S/P CABG x 5 2004     FAMILY HISTORY:        Family History  Problem Relation Age of Onset  . Cancer Mother     SOCIAL HISTORY:   Social History        Tobacco Use  . Smoking status: Former Smoker    Packs/day: 1.00    Years: 25.00    Pack years: 25.00    Types: Cigarettes    Quit date: 04/25/1980    Years since quitting: 40.0  . Smokeless tobacco: Never Used  Substance  Use Topics  . Alcohol use: No    Alcohol/week: 0.0 standard drinks     ALLERGIES:        Allergies  Allergen Reactions  . Ace Inhibitors Other (See Comments)    COUGH  . Chocolate     Headaches   . Doxycycline     Nausea and abd pain  . Gabapentin     Dizziness  . Hycodan [Hydrocodone-Homatropine] Nausea And Vomiting  . Oxycodone Nausea Only    Pt states that he cannot take prescription pain medication  . Zocor [Simvastatin] Other (See Comments)    MYALGIA  . Cephalexin Rash     CURRENT MEDICATIONS:         Current Outpatient Medications  Medication Sig Dispense Refill  . acetaminophen (TYLENOL) 325 MG tablet Take 650 mg by mouth once as needed for moderate pain or headache.    Marland Kitchen apixaban (ELIQUIS) 5 MG TABS tablet Take 1 tablet (5 mg total) by mouth 2 (two) times daily. 180 tablet 3  . aspirin EC 81 MG tablet Take 1 tablet (81  mg total) by mouth daily. Swallow whole. 150 tablet 2  . butalbital-acetaminophen-caffeine (FIORICET, ESGIC) 50-325-40 MG per tablet Take 1 tablet by mouth 3 (three) times daily as needed for migraine.     . cholecalciferol (VITAMIN D) 1000 UNITS tablet Take 1,000 Units by mouth daily.    Marland Kitchen diltiazem (CARDIZEM CD) 120 MG 24 hr capsule Take 1 capsule (120 mg) by mouth once daily    . fluticasone (FLONASE) 50 MCG/ACT nasal spray Place 1 spray into both nostrils daily. 16 g 1  . hydrochlorothiazide (HYDRODIURIL) 12.5 MG tablet TAKE 1 TABLET BY MOUTH EACH MORNING 90 tablet 3  . loratadine (CLARITIN) 10 MG tablet Take 10 mg by mouth daily.    . meclizine (ANTIVERT) 25 MG tablet Take 1 tablet (25 mg total) by mouth 2 (two) times daily as needed for dizziness. 60 tablet 1  . Melatonin 3 MG TABS Take 3 mg by mouth at bedtime.     Vladimir Faster Glycol-Propyl Glycol (SYSTANE ULTRA OP) Apply 1-2 drops to eye once as needed (dry eyes.).     Marland Kitchen potassium chloride (K-DUR) 10 MEQ tablet Take 10 mEq by mouth every morning. Take as directed    . pravastatin (PRAVACHOL) 80 MG tablet Take 1 tablet (80 mg total) by mouth daily.     No current facility-administered medications for this visit.    REVIEW OF SYSTEMS:   _0  denotes positive finding, _1  denotes negative finding Cardiac  Comments:  Chest pain or chest pressure:    Shortness of breath upon exertion:    Short of breath when lying flat:    Irregular heart rhythm:        Vascular    Pain in calf, thigh, or hip brought on by ambulation:    Pain in feet at night that wakes you up from your sleep:     Blood clot in your veins:    Leg swelling:         Pulmonary    Oxygen at home:    Productive cough:     Wheezing:         Neurologic    Sudden weakness in arms or legs:     Sudden numbness in arms or legs:     Sudden onset of difficulty speaking or slurred speech:    Temporary loss of  vision in one eye:  x   Problems with  dizziness:         Gastrointestinal    Blood in stool:     Vomited blood:         Genitourinary    Burning when urinating:     Blood in urine:        Psychiatric    Major depression:         Hematologic    Bleeding problems:    Problems with blood clotting too easily:        Skin    Rashes or ulcers:        Constitutional    Fever or chills:      PHYSICAL EXAM:   There were no vitals filed for this visit.  GENERAL: The patient is a well-nourished male, in no acute distress. The vital signs are documented above. CARDIAC: There is a regular rate and rhythm.  PULMONARY: Non-labored respirations ABDOMEN: Soft and non-tender with normal pitched bowel sounds.  NEUROLOGIC: No focal weakness or paresthesias are detected. SKIN: There are no ulcers or rashes noted. PSYCHIATRIC: The patient has a normal affect.  STUDIES:   I have reviewed his CTA with the following findings: No acute intracranial abnormality. Moderate chronic microvascular ischemic changes.  Calcified plaque at the left greater than right ICA origins without hemodynamically significant stenosis.  No proximal intracranial vessel occlusion or significant stenosis.  MEDICAL ISSUES:   Symptomatic left carotid stenosis: The patient was here with his wife.  We discussed the CT scan findings and that Dr. Erlinda Hong and myself feel the blockages greater than 50% and the most likely explanation for his vision loss.  I have recommended proceeding with left carotid endarterectomy.  We discussed the risk and benefits including risk of stroke, nerve injury.  All the questions were answered.  He would like to think about whether or not to proceed and will contact me once he has made a final decision.  He is not a candidate for TCAR given the tortuosity within his carotid artery    Annamarie Major, IV, MD, FACS [facility] [phone] Pager [phone]  Still with occasional blurry vision, no new TIA ID:[id number] PULM: CTA Neuro intact  Plan for Left CEA.  All questions answered  Annamarie Major

## 2020-07-08 NOTE — Anesthesia Procedure Notes (Addendum)
Procedure Name: Intubation Date/Time: 07/08/2020 11:15 AM Performed by: Clearnce Sorrel, CRNA Pre-anesthesia Checklist: Patient identified, Emergency Drugs available, Suction available, Patient being monitored and Timeout performed Patient Re-evaluated:Patient Re-evaluated prior to induction Oxygen Delivery Method: Circle system utilized Preoxygenation: Pre-oxygenation with 100% oxygen Induction Type: IV induction Ventilation: Mask ventilation without difficulty Laryngoscope Size: Mac and 4 Grade View: Grade IV Tube type: Oral Tube size: 7.5 mm Number of attempts: 2 Airway Equipment and Method: Stylet Placement Confirmation: positive ETCO2 and breath sounds checked- equal and bilateral Secured at: 23 cm Tube secured with: Tape Dental Injury: Teeth and Oropharynx as per pre-operative assessment  Difficulty Due To: Difficult Airway- due to immobile epiglottis Future Recommendations: Recommend- induction with short-acting agent, and alternative techniques readily available Comments: Attempt x1 by CRNA without adequate view of glottis.  Attempt x1 by MDA with Grave 4 view, ETT passed through VC and placement confirmed with +BBS, +EtCOS, +chest rise.  Collins Scotland, MD

## 2020-07-08 NOTE — Progress Notes (Signed)
Patient brought to 4E from PACU. Telemetry box applied, CCMD notified. VSS. Patient oriented to room and staff. Call bell in reach. ? ?[name] Shampine L Rayquon Uselman, RN  ?

## 2020-07-08 NOTE — Transfer of Care (Signed)
Immediate Anesthesia Transfer of Care Note  Patient: [name] [name]  Procedure(s) Performed: LEFT CAROTID ENDARTERECTOMY (Left )  Patient Location: PACU  Anesthesia Type:General  Level of Consciousness: awake, alert  and oriented  Airway & Oxygen Therapy: Patient Spontanous Breathing  Post-op Assessment: Report given to RN and Post -op Vital signs reviewed and stable  Post vital signs: Reviewed and stable  Last Vitals:  Vitals Value Taken Time  BP 153/90 07/08/20 1323  Temp    Pulse 89 07/08/20 1323  Resp 12 07/08/20 1323  SpO2 92 % 07/08/20 1323  Vitals shown include unvalidated device data.  Last Pain:  Vitals:   07/08/20 1007  TempSrc:   PainSc: 0-No pain         Complications: No complications documented.

## 2020-07-08 NOTE — Progress Notes (Signed)
  Progress Note    07/08/2020 3:53 PM Day of Surgery  Subjective: no complaints. Says he feels good. Has not ambulated or eaten yet   Vitals:   07/08/20 1440 07/08/20 1545  BP: 126/84 (!) 155/103  Pulse: 79 84  Resp: 16 17  Temp: 98 F (36.7 C)   SpO2: 95% 95%   Physical Exam: Cardiac: regular Lungs: non labored Incisions: left neck incision is clean dry and intact. No swelling or hematoma Extremities: moving all extremities without deficits Neurologic: alert and oriented. CN intact. Smile symmetric. Tongue midline. Speech coherent  CBC    Component Value Date/Time   WBC 6.6 07/02/2020 1200   RBC 4.18 (L) 07/02/2020 1200   HGB 13.7 07/02/2020 1200   HGB 14.2 09/02/2019 0921   HGB 14.5 12/21/2015 0000   HCT 39.3 07/02/2020 1200   HCT 42.4 09/02/2019 0921   PLT 222 07/02/2020 1200   PLT 223 09/02/2019 0921   PLT 211 11/01/2012 0000   MCV 94.0 07/02/2020 1200   MCV 93 09/02/2019 0921   MCH 32.8 07/02/2020 1200   MCHC 34.9 07/02/2020 1200   RDW 13.4 07/02/2020 1200   RDW 12.9 09/02/2019 0921   LYMPHSABS 2.2 03/10/2020 1743   LYMPHSABS 1.9 08/22/2017 1627   MONOABS 0.7 03/10/2020 1743   EOSABS 0.2 03/10/2020 1743   EOSABS 0.1 08/22/2017 1627   BASOSABS 0.0 03/10/2020 1743   BASOSABS 0.0 08/22/2017 1627    BMET    Component Value Date/Time   NA 138 07/02/2020 1200   NA 140 09/02/2019 0921   K 4.1 07/02/2020 1200   K 3.9 12/20/2017 0000   CL 102 07/02/2020 1200   CO2 30 07/02/2020 1200   GLUCOSE 83 07/02/2020 1200   BUN 13 07/02/2020 1200   BUN 13 09/02/2019 0921   CREATININE 0.88 07/02/2020 1200   CREATININE 1.010 12/20/2017 0000   CREATININE 0.9 11/27/2013 0000   CALCIUM 9.2 07/02/2020 1200   GFRNONAA >60 07/02/2020 1200   GFRAA 83 09/02/2019 0921    INR    Component Value Date/Time   INR 1.1 07/02/2020 1200     Intake/Output Summary (Last 24 hours) at 07/08/2020 1553 Last data filed at 07/08/2020 1425 Gross per 24 hour  Intake 1500 ml   Output 225 ml  Net 1275 ml     Assessment/Plan:  86 y.o. male is s/p left CEA Day of Surgery. Doing well post op. Neurologically intact. Hemodynamically stable. Restart Eliquis tomorrow. Anticipate discharge home tomorrow if he does well overnight  Karoline Caldwell, [place] [facility] [phone] 07/08/2020 3:53 PM

## 2020-07-08 NOTE — Progress Notes (Signed)
Mobility Specialist - Progress Note   07/08/20 1735  Mobility  Activity Ambulated in hall  Level of Assistance Standby assist, set-up cues, supervision of patient - no hands on  Assistive Device None  Distance Ambulated (ft) 470 ft  Mobility Response Tolerated well  Mobility performed by Mobility specialist  $Mobility charge 1 Mobility   Pre-mobility:90 HR, 147/90 BP, 98% SpO2 Post-mobility: 90 HR, 177/106 BP, 98% SpO2  Pt asx throughout ambulation. He had some instability while walking, but was able to correct it on his own. Pt sitting up on edge of bed after walk.   Pricilla Handler Mobility Specialist Mobility Specialist Phone: [phone]

## 2020-07-08 NOTE — Op Note (Signed)
Patient name: [name] [name] MRN: [id number] DOB: 08-04-1934 Sex: male  07/08/2020 Pre-operative Diagnosis: Symptomatic   left carotid stenosis Post-operative diagnosis:  Same Surgeon:  Annamarie Major Assistants:  Ivin Booty, PA Procedure:   #1:  left carotid Endarterectomy with bovine pericardial patch angioplasty   #2: Resection of internal carotid artery with primary anastomosis of the posterior wall Anesthesia:  General Blood Loss: 100 cc Specimens: None  Findings: 70%stenosis; Thrombus: None  Indications: The patient suffered a left retinal artery embolic occlusion and has lost the superior aspect of his visual field.  His work-up included a CT scan which showed approximately 50% stenosis.  On my review of the CT scan as well as with the stroke service, I felt that this was greater than 50%, and in addition there was soft plaque at the area.  I felt this most likely represented the etiology of his stroke.  Therefore endarterectomy was recommended.  The patient had a severe kink in his carotid artery, therefore he was not a stent candidate.  Procedure:  The patient was identified in the holding area and taken to [place] [facility]  The patient was then placed supine on the table.   General endotrachial anesthesia was administered.  The patient was prepped and draped in the usual sterile fashion.  A time out was called and antibiotics were administered.  The incision was made along the anterior border of the left sternocleidomastoid muscle.  Cautery was used to dissect through the subcutaneous tissue.  The platysma muscle was divided with cautery.  The internal jugular vein was exposed along its anterior medial border.  The common facial vein was exposed and then divided between 2-0 silk ties and metal clips.  The common carotid artery was then circumferentially exposed and encircled with an umbilical tape.  The vagus nerve was identified and protected.  Next sharp dissection was used to expose  the external carotid artery and the superior thyroid artery.  The were encircled with a blue vessel loop and a 2-0 silk tie respectively.  Finally, the internal carotid was carefully dissected free.  An umbilical tape was placed around the internal carotid artery distal to the diseased segment.  The hypoglossal nerve was visualized throughout and protected.  The patient was given systemic heparinization.  A bovine carotid patch was selected and prepared on the back table.  A 10 french shunt was also prepared.  After blood pressure readings were appropriate and the heparin had been given time to circulate, the internal carotid artery was occluded with a baby Gregory clamp.  The external and common carotid arteries were then occluded with vascular clamps and the 2-0 tie tightened on the superior thyroid artery.  A #11 blade was used to make an arteriotomy in the common carotid artery.  This was extended with Potts scissors along the anterior and lateral border of the common and internal carotid artery.  Approximately 70% stenosis was identified.  There was no thrombus identified.  The 10 french shunt was  placed, as there was pulsatile backbleeding.  A kleiner kuntz elevator was used to perform endarterectomy.  An eversion endarterectomy was performed in the external carotid artery.  A good distal endpoint was obtained in the internal carotid artery.  The specimen was removed and sent to pathology.  There was a large kink in the distal internal carotid artery.  Therefore I resected approximately 1.5 cm of the internal carotid artery in order to avoid the kink.  The posterior  wall was reapproximated with running 7-0 Prolene.  Heparinized saline was used to irrigate the endarterectomized field.  All potential embolic debris was removed.  Bovine pericardial patch angioplasty was then performed using a running 6-0 Prolene. The common internal and external carotid arteries were all appropriately flushed. The artery was  again irrigated with heparin saline.  The anastomosis was then secured. The clamp was first released on the external carotid artery followed by the common carotid artery approximately 30 seconds later, bloodflow was reestablish through the internal carotid artery.  Next, a hand-held  Doppler was used to evaluate the signals in the common, external, and internal  carotid arteries, all of which had appropriate signals. I then administered  50 mg protamine. The wound was then irrigated.  After hemostasis was achieved, the carotid sheath was reapproximated with 3-0 Vicryl. The  platysma muscle was reapproximated with running 3-0 Vicryl. The skin  was closed with 4-0 Vicryl. Dermabond was placed on the skin. The  patient was then successfully extubated. His neurologic exam was  similar to his preprocedural exam. The patient was then taken to recovery room  in stable condition. There were no complications.     Disposition:  To PACU in stable condition.  Relevant Operative Details: The patient had a ulcerated plaque with approximately 70% stenosis in the internal carotid artery.  He also had a 90 degree kink just beyond the plaque.  The internal carotid artery was fully mobilized.  In order to avoid complications, I elected to resect the kink.  This was a resection of approximately 1.5 cm of the internal carotid artery.  The posterior wall was then reapproximated with running 7-0 Prolene and a bovine patch was used for patch angioplasty.  Theotis Burrow, M.D., [facility] [place] Office: [phone] Pager:  [phone]

## 2020-07-09 ENCOUNTER — Encounter (HOSPITAL_COMMUNITY): Payer: Self-pay | Admitting: Surgery

## 2020-07-09 LAB — CBC
HCT: 35.7 % — ABNORMAL LOW (ref 39.0–52.0)
Hemoglobin: 12.5 g/dL — ABNORMAL LOW (ref 13.0–17.0)
MCH: 32.7 pg (ref 26.0–34.0)
MCHC: 35 g/dL (ref 30.0–36.0)
MCV: 93.5 fL (ref 80.0–100.0)
Platelets: 176 10*3/uL (ref 150–400)
RBC: 3.82 MIL/uL — ABNORMAL LOW (ref 4.22–5.81)
RDW: 13.6 % (ref 11.5–15.5)
WBC: 10.7 10*3/uL — ABNORMAL HIGH (ref 4.0–10.5)
nRBC: 0 % (ref 0.0–0.2)

## 2020-07-09 LAB — BASIC METABOLIC PANEL
Anion gap: 8 (ref 5–15)
BUN: 15 mg/dL (ref 8–23)
CO2: 24 mmol/L (ref 22–32)
Calcium: 8.8 mg/dL — ABNORMAL LOW (ref 8.9–10.3)
Chloride: 103 mmol/L (ref 98–111)
Creatinine, Ser: 1.28 mg/dL — ABNORMAL HIGH (ref 0.61–1.24)
GFR, Estimated: 56 mL/min — ABNORMAL LOW (ref >60)
Glucose, Bld: 210 mg/dL — ABNORMAL HIGH (ref 70–99)
Potassium: 3.7 mmol/L (ref 3.5–5.1)
Sodium: 135 mmol/L (ref 135–145)

## 2020-07-09 MED ORDER — BUTALBITAL-APAP-CAFFEINE 50-325-40 MG PO TABS
1.0000 | ORAL_TABLET | Freq: Three times a day (TID) | ORAL | Status: DC | PRN
  Administered 2020-07-09 (×2): 1 via ORAL
  Filled 2020-07-09 (×2): qty 1

## 2020-07-09 MED ORDER — TRAMADOL HCL 50 MG PO TABS: 50.0000 mg | ORAL_TABLET | Freq: Four times a day (QID) | ORAL | 0 refills | Status: DC | PRN

## 2020-07-09 MED ORDER — APIXABAN 5 MG PO TABS: 5.0000 mg | ORAL_TABLET | Freq: Two times a day (BID) | ORAL | 3 refills | Status: DC

## 2020-07-09 NOTE — Anesthesia Postprocedure Evaluation (Signed)
Anesthesia Post Note  Patient: [name] [name]  Procedure(s) Performed: LEFT CAROTID ENDARTERECTOMY (Left )     Patient location during evaluation: PACU Anesthesia Type: General Level of consciousness: awake and alert Pain management: pain level controlled Vital Signs Assessment: post-procedure vital signs reviewed and stable Respiratory status: spontaneous breathing, nonlabored ventilation, respiratory function stable and patient connected to nasal cannula oxygen Cardiovascular status: blood pressure returned to baseline and stable Postop Assessment: no apparent nausea or vomiting Anesthetic complications: no   No complications documented.  Last Vitals:  Vitals:   07/09/20 0400 07/09/20 0747  BP: (!) 141/91 (!) 141/84  Pulse: 84   Resp: 16 18  Temp: 36.7 C 36.5 C  SpO2: 95% 97%    Last Pain:  Vitals:   07/09/20 0747  TempSrc: Oral  PainSc: [name]

## 2020-07-09 NOTE — Plan of Care (Signed)

## 2020-07-09 NOTE — Progress Notes (Signed)
  Progress Note    07/09/2020 7:15 AM 1 Day Post-Op  Subjective:  States he did not sleep well. Woke up every hour. Neck feels stiff and sore. Sitting up on side of bed eating breakfast. Tolerating diet. Voided without issues. Ambulated in hall yesterday evening without difficulty    Vitals:   07/09/20 0145 07/09/20 0400  BP: 117/69 (!) 141/91  Pulse: 96 84  Resp: 20 16  Temp: 98.3 F (36.8 C) 98.1 F (36.7 C)  SpO2: 98% 95%   Physical Exam: Cardiac: regular Lungs: non labored Incisions: Left neck incision is clean, dry and intact. No swelling or hematoma Extremities: moving all extremities without deficits Neurologic: alert and oriented. CN intact. Smile symmetric. Tongue midline. Speech coherent. Sensation equal and intact bilaterally  CBC    Component Value Date/Time   WBC 10.7 (H) 07/09/2020 0149   RBC 3.82 (L) 07/09/2020 0149   HGB 12.5 (L) 07/09/2020 0149   HGB 14.2 09/02/2019 0921   HGB 14.5 12/21/2015 0000   HCT 35.7 (L) 07/09/2020 0149   HCT 42.4 09/02/2019 0921   PLT 176 07/09/2020 0149   PLT 223 09/02/2019 0921   PLT 211 11/01/2012 0000   MCV 93.5 07/09/2020 0149   MCV 93 09/02/2019 0921   MCH 32.7 07/09/2020 0149   MCHC 35.0 07/09/2020 0149   RDW 13.6 07/09/2020 0149   RDW 12.9 09/02/2019 0921   LYMPHSABS 2.2 03/10/2020 1743   LYMPHSABS 1.9 08/22/2017 1627   MONOABS 0.7 03/10/2020 1743   EOSABS 0.2 03/10/2020 1743   EOSABS 0.1 08/22/2017 1627   BASOSABS 0.0 03/10/2020 1743   BASOSABS 0.0 08/22/2017 1627    BMET    Component Value Date/Time   NA 135 07/09/2020 0149   NA 140 09/02/2019 0921   K 3.7 07/09/2020 0149   K 3.9 12/20/2017 0000   CL 103 07/09/2020 0149   CO2 24 07/09/2020 0149   GLUCOSE 210 (H) 07/09/2020 0149   BUN 15 07/09/2020 0149   BUN 13 09/02/2019 0921   CREATININE 1.28 (H) 07/09/2020 0149   CREATININE 1.010 12/20/2017 0000   CREATININE 0.9 11/27/2013 0000   CALCIUM 8.8 (L) 07/09/2020 0149   GFRNONAA 56 (L) 07/09/2020  0149   GFRAA 83 09/02/2019 0921    INR    Component Value Date/Time   INR 1.1 07/02/2020 1200     Intake/Output Summary (Last 24 hours) at 07/09/2020 0715 Last data filed at 07/09/2020 0300 Gross per 24 hour  Intake 2776.22 ml  Output 2675 ml  Net 101.22 ml     Assessment/Plan:  86 y.o. male is s/p Left CEA 1 Day Post-Op. Doing well post op. Neurologically intact. VSS. Hemodynamically stable. Left neck incision looks good. He is stable for discharge home today. He will go home on Statin, Aspirin, Apixaban. He can restart his Apixaban this evening. He will have follow up in 2 weeks in the office    Karoline Caldwell, [place] [facility] [phone] 07/09/2020 7:15 AM

## 2020-07-09 NOTE — Plan of Care (Signed)
  Problem: Education: Goal: Knowledge of General Education information will improve Description: Including pain rating scale, medication(s)/side effects and non-pharmacologic comfort measures Outcome: Adequate for Discharge   

## 2020-07-09 NOTE — Progress Notes (Signed)
Discharge instructions (including medications) discussed with and copy provided to patient/caregiver 

## 2020-07-09 NOTE — Discharge Summary (Signed)
Carotid Discharge Summary     [name] [name] 1935-01-03 86 y.o. male  [id number]  Admission Date: 07/08/2020  Discharge Date: 07/09/20  Physician: Serafina Mitchell, MD  Admission Diagnosis: Stenosis of internal carotid artery with cerebral infarction, right [facility]) [facility] Course:  The patient was admitted to the hospital and taken to the operating room on 07/08/2020 and underwent left carotid endarterectomy.  The pt tolerated the procedure well and was transported to the PACU in good condition. He remained stable and was transferred to the floor post operatively.    By POD 1, the pt neuro status remained intact. VSS. Hemodynamically stable. He had some left neck discomfort overnight so did not sleep well. Otherwise tolerating diet. Ambulated without issues. Voiding without difficulty. Left neck incision clean, dry and intact without swelling or hematoma.   The remainder of the hospital course consisted of increasing mobilization and increasing intake of solids without difficulty.  He remained stable for discharge home. PDMP was reviewed and pain medication was sent to patients pharmacy. He was instructed to resume all home medications as prescribed including Aspirin, statin and Apixaban. He has follow up arranged in the office in 2 weeks    Recent Labs    07/09/20 0149  NA 135  K 3.7  CL 103  CO2 24  GLUCOSE 210*  BUN 15  CALCIUM 8.8*   Recent Labs    07/08/20 1532 07/09/20 0149  WBC 6.4 10.7*  HGB 13.1 12.5*  HCT 37.9* 35.7*  PLT 188 176   No results for input(s): INR in the last 72 hours.   Discharge Instructions    Discharge patient   Complete by: As directed    Discharge disposition: 01-Home or Self Care   Discharge patient date: 07/09/2020      Discharge Diagnosis:  Stenosis of internal carotid artery with cerebral infarction, right [facility]) [I63.231]  Secondary Diagnosis: Patient Active Problem List   Diagnosis Date Noted  . Stenosis  of internal carotid artery with cerebral infarction, right ([place]) 07/08/2020  . Glaucoma (increased eye pressure) 06/19/2020  . Stress reaction 06/19/2020  . Retinal artery occlusion 03/10/2020  . Constipation 12/22/2019  . Right foot pain 09/11/2019  . Leg pain 06/23/2019  . Muscle pain 04/04/2019  . Sciatica 12/28/2017  . Neuropathy 10/07/2017  . Foot swelling 10/07/2017  . Shoulder pain 03/29/2017  . CAD (coronary artery disease), autologous vein bypass graft 10/31/2016  . Family history of colon cancer 10/31/2016  . History of adenomatous polyp of colon 10/31/2016  . Osteoarthritis 10/31/2016  . Atrial fibrillation ([place]) 10/31/2016  . Syncope 02/11/2016  . High risk medication use   . Skin lesion 06/04/2015  . Lower back pain 05/01/2015  . Elevated TSH 12/26/2014  . Back pain 01/22/2014  . Aortic stenosis 05/16/2013  . Bladder neoplasm 11/23/2012  . BCC (basal cell carcinoma of skin) 08/29/2012  . Vertigo 08/24/2012  . GERD (gastroesophageal reflux disease) 05/01/2012  . Hyperlipidemia 03/14/2012  . TMJ pain dysfunction syndrome 01/20/2012  . Long term (current) use of anticoagulants 08/31/2011  . RVOT ventricular tachycardia ([place])   . Cough 04/29/2009  . HEARING LOSS, SUDDEN 12/03/2007  . Essential hypertension 02/22/2007  . PAF (paroxysmal atrial fibrillation) ([place]) 02/22/2007   Past Medical History:  Diagnosis Date  . Anticoagulated on Coumadin    followed in [facility]RR at [facility]  . Aortic stenosis    a. Echo 2/15: Severe LVH, EF 55-60%, Gr 1 DD, mild to mod AS (mean  20 mmHg, peak 31 mmHg), AVA 1.4-1.5 cm2, mild AI, MAC, trivial MR, LA upper limits of normal, RVSP 22 mmHg, mild RAE;   b. Echo 2/16:  EF 55-60%, no RWMA, mild AS (mean 17 mmHg, peak 27 mmHg), AVA 1.5 cm2, mild AI, MAC, mild LAE  . Arthritis   . Bladder neoplasm   . Chronic cough    NO CARDIAC OR PULMONARY RELATED  . Complication of anesthesia   . Coronary artery disease CARDIOLOGIST-  DR KLEIN/  ALLRED   a. s/p CABG 2004, b. LHC with patent grafts 07/2005, ejection fraction 50%;  c. Myoview 2/15: no ischemia, EF 61%  . Dry eyes   . Dyslipidemia   . GERD (gastroesophageal reflux disease)   . HTN (hypertension)   . Mild aortic stenosis   . PAF (paroxysmal atrial fibrillation) ([place])    CHADS2-VASc:  4  . Paroxysmal atrial fibrillation ([facility])   . PONV (postoperative nausea and vomiting)    From having surgery back in the 1950's  . RVOT-VT (right ventricular outflow tract ventricular tachycardia) ([place])    a. Amiodarone Rx  . S/P CABG x 5 2004  . Stroke ([place]) 03/10/2020   Stroke in the Left eye    Allergies as of 07/09/2020      Reactions   Ace Inhibitors Other (See Comments)   COUGH   Zocor [simvastatin] Other (See Comments)   MYALGIA   Cephalexin Rash   Chocolate    Headaches   Doxycycline Other (See Comments)   Nausea and abd pain   Gabapentin Other (See Comments)   Dizziness   Hycodan [hydrocodone-homatropine] Nausea And Vomiting   Oxycodone Nausea Only   Pt states that he cannot take prescription pain medication      Medication List    TAKE these medications   acetaminophen 325 MG tablet Commonly known as: TYLENOL Take 650 mg by mouth once as needed for moderate pain or headache.   apixaban 5 MG Tabs tablet Commonly known as: Eliquis Take 1 tablet (5 mg total) by mouth 2 (two) times daily. Take evening dose starting 07/09/20 What changed: additional instructions   aspirin EC 81 MG tablet Take 1 tablet (81 mg total) by mouth daily. Swallow whole.   butalbital-acetaminophen-caffeine 50-325-40 MG tablet Commonly known as: FIORICET Take 1 tablet by mouth 3 (three) times daily as needed for migraine.   diltiazem 120 MG 24 hr capsule Commonly known as: CARDIZEM CD Take 1 capsule (120 mg) by mouth once daily   fluticasone 50 MCG/ACT nasal spray Commonly known as: FLONASE Place 1 spray into both nostrils daily. What changed:   when to take this  reasons  to take this   hydrochlorothiazide 12.5 MG tablet Commonly known as: HYDRODIURIL TAKE 1 TABLET BY MOUTH EACH MORNING What changed:   how much to take  how to take this  when to take this   loratadine 10 MG tablet Commonly known as: CLARITIN Take 10 mg by mouth daily.   Lumigan 0.01 % Soln Generic drug: bimatoprost Place 1 drop into both eyes at bedtime.   meclizine 25 MG tablet Commonly known as: ANTIVERT Take 1 tablet (25 mg total) by mouth 2 (two) times daily as needed for dizziness.   multivitamin with minerals tablet Take 1 tablet by mouth daily.   potassium chloride 10 MEQ tablet Commonly known as: KLOR-CON Take 10 mEq by mouth every morning. Take as directed   pravastatin 80 MG tablet Commonly known as: PRAVACHOL Take 1 tablet (  80 mg total) by mouth daily.   SYSTANE ULTRA OP Apply 1-2 drops to eye once as needed (dry eyes.).   traMADol 50 MG tablet Commonly known as: Ultram Take 1 tablet (50 mg total) by mouth every 6 (six) hours as needed.        Discharge Instructions:   [facility] Discharge Instructions Carotid Endarterectomy (CEA)  Please refer to the following instructions for your post-procedure care. Your surgeon or physician assistant will discuss any changes with you.  Activity  You are encouraged to walk as much as you can. You can slowly return to normal activities but must avoid strenuous activity and heavy lifting until your doctor tell you it's OK. Avoid activities such as vacuuming or swinging a golf club. You can drive after one week if you are comfortable and you are no longer taking prescription pain medications. It is normal to feel tired for serval weeks after your surgery. It is also normal to have difficulty with sleep habits, eating, and bowel movements after surgery. These will go away with time.  Bathing/Showering  You may shower after you come home. Do not soak in a bathtub, hot tub, or swim  until the incision heals completely.  Incision Care  Shower every day. Clean your incision with mild soap and water. Pat the area dry with a clean towel. You do not need a bandage unless otherwise instructed. Do not apply any ointments or creams to your incision. You may have skin glue on your incision. Do not peel it off. It will come off on its own in about one week. Your incision may feel thickened and raised for several weeks after your surgery. This is normal and the skin will soften over time. For Men Only: It's OK to shave around the incision but do not shave the incision itself for 2 weeks. It is common to have numbness under your chin that could last for several months.  Diet  Resume your normal diet. There are no special food restrictions following this procedure. A low fat/low cholesterol diet is recommended for all patients with vascular disease. In order to heal from your surgery, it is CRITICAL to get adequate nutrition. Your body requires vitamins, minerals, and protein. Vegetables are the best source of vitamins and minerals. Vegetables also provide the perfect balance of protein. Processed food has little nutritional value, so try to avoid this.  Medications  Resume taking all of your medications unless your doctor or physician assistant tells you not to.  If your incision is causing pain, you may take over-the- counter pain relievers such as acetaminophen (Tylenol). If you were prescribed a stronger pain medication, please be aware these medications can cause nausea and constipation.  Prevent nausea by taking the medication with a snack or meal. Avoid constipation by drinking plenty of fluids and eating foods with a high amount of fiber, such as fruits, vegetables, and grains. Do not take Tylenol if you are taking prescription pain medications.  Follow Up  Our office will schedule a follow up appointment 2-3 weeks following discharge.  Please call us immediately for any of the  following conditions  . Increased pain, redness, drainage (pus) from your incision site. . Fever of 101 degrees or higher. . If you should develop stroke (slurred speech, difficulty swallowing, weakness on one side of your body, loss of vision) you should call 911 and go to the nearest emergency room. .  Reduce your risk of vascular disease:  .  Stop smoking. If you would like help call [facility] at 1-800-QUIT-NOW 505-185-9106) or [place] at [phone]. . Manage your cholesterol . Maintain a desired weight . Control your diabetes . Keep your blood pressure down .  If you have any questions, please call the office at [phone].  Prescriptions given: Tramadol 50 mg #15 No Refill  Disposition: home  Patient's condition: is Excellent  Follow up: 1. Dr. Trula Slade in 2 weeks.   Corrina Baglia PA-C [facility] [phone]   --- For [facility] use ---   Modified Rankin score at D/C (0-6): 0  IV medication needed for:  1. Hypertension: No 2. Hypotension: No  Post-op Complications: No  1. Post-op CVA or TIA: No  If yes: Event classification (right eye, left eye, right cortical, left cortical, verterobasilar, other): n/a  If yes: Timing of event (intra-op, <6 hrs post-op, >=6 hrs post-op, unknown): n/a  2. CN injury: No  If yes: CN not injuried   3. Myocardial infarction: No  If yes: Dx by (EKG or clinical, Troponin): n/a  4.  CHF: No  5.  Dysrhythmia (new): No  6. Wound infection: No  7. Reperfusion symptoms: No  8. Return to OR: No  If yes: return to OR for (bleeding, neurologic, other CEA incision, other): n/a  Discharge medications: Statin use:  Yes ASA use:  Yes   Beta blocker use:  No ACE-Inhibitor use:  No  ARB use:  No CCB use: Yes P2Y12 Antagonist use: No _0  Plavix, _1  Plasugrel, _2  Ticlopinine, _3  Ticagrelor, _4  Other, _5  No for medical reason, _6  Non-compliant, Valu.[place]ieves ] Not-indicated Anti-coagulant use:   Yes, _7  Warfarin, _8  Rivaroxaban, _9  Dabigatran, _10  Apixaban

## 2020-07-10 DIAGNOSIS — Z9862 Peripheral vascular angioplasty status: Secondary | ICD-10-CM | POA: Diagnosis not present

## 2020-07-10 DIAGNOSIS — Z7901 Long term (current) use of anticoagulants: Secondary | ICD-10-CM | POA: Diagnosis not present

## 2020-07-10 DIAGNOSIS — I2510 Atherosclerotic heart disease of native coronary artery without angina pectoris: Secondary | ICD-10-CM | POA: Diagnosis not present

## 2020-07-10 DIAGNOSIS — I480 Paroxysmal atrial fibrillation: Secondary | ICD-10-CM | POA: Diagnosis not present

## 2020-07-10 DIAGNOSIS — I6522 Occlusion and stenosis of left carotid artery: Secondary | ICD-10-CM | POA: Diagnosis not present

## 2020-07-10 DIAGNOSIS — Z48812 Encounter for surgical aftercare following surgery on the circulatory system: Secondary | ICD-10-CM | POA: Diagnosis not present

## 2020-07-13 DIAGNOSIS — Z48812 Encounter for surgical aftercare following surgery on the circulatory system: Secondary | ICD-10-CM | POA: Diagnosis not present

## 2020-07-13 DIAGNOSIS — I480 Paroxysmal atrial fibrillation: Secondary | ICD-10-CM | POA: Diagnosis not present

## 2020-07-13 DIAGNOSIS — I2510 Atherosclerotic heart disease of native coronary artery without angina pectoris: Secondary | ICD-10-CM | POA: Diagnosis not present

## 2020-07-13 DIAGNOSIS — Z9862 Peripheral vascular angioplasty status: Secondary | ICD-10-CM | POA: Diagnosis not present

## 2020-07-13 DIAGNOSIS — Z7901 Long term (current) use of anticoagulants: Secondary | ICD-10-CM | POA: Diagnosis not present

## 2020-07-13 DIAGNOSIS — I6522 Occlusion and stenosis of left carotid artery: Secondary | ICD-10-CM | POA: Diagnosis not present

## 2020-07-15 DIAGNOSIS — Z9862 Peripheral vascular angioplasty status: Secondary | ICD-10-CM | POA: Diagnosis not present

## 2020-07-15 DIAGNOSIS — I6522 Occlusion and stenosis of left carotid artery: Secondary | ICD-10-CM | POA: Diagnosis not present

## 2020-07-15 DIAGNOSIS — I480 Paroxysmal atrial fibrillation: Secondary | ICD-10-CM | POA: Diagnosis not present

## 2020-07-15 DIAGNOSIS — Z48812 Encounter for surgical aftercare following surgery on the circulatory system: Secondary | ICD-10-CM | POA: Diagnosis not present

## 2020-07-15 DIAGNOSIS — I2510 Atherosclerotic heart disease of native coronary artery without angina pectoris: Secondary | ICD-10-CM | POA: Diagnosis not present

## 2020-07-15 DIAGNOSIS — Z7901 Long term (current) use of anticoagulants: Secondary | ICD-10-CM | POA: Diagnosis not present

## 2020-07-17 DIAGNOSIS — Z48812 Encounter for surgical aftercare following surgery on the circulatory system: Secondary | ICD-10-CM | POA: Diagnosis not present

## 2020-07-17 DIAGNOSIS — Z9862 Peripheral vascular angioplasty status: Secondary | ICD-10-CM | POA: Diagnosis not present

## 2020-07-17 DIAGNOSIS — I6522 Occlusion and stenosis of left carotid artery: Secondary | ICD-10-CM | POA: Diagnosis not present

## 2020-07-17 DIAGNOSIS — Z7901 Long term (current) use of anticoagulants: Secondary | ICD-10-CM | POA: Diagnosis not present

## 2020-07-17 DIAGNOSIS — I480 Paroxysmal atrial fibrillation: Secondary | ICD-10-CM | POA: Diagnosis not present

## 2020-07-17 DIAGNOSIS — I2510 Atherosclerotic heart disease of native coronary artery without angina pectoris: Secondary | ICD-10-CM | POA: Diagnosis not present

## 2020-07-21 ENCOUNTER — Telehealth: Payer: Self-pay | Admitting: *Deleted

## 2020-07-21 DIAGNOSIS — Z9862 Peripheral vascular angioplasty status: Secondary | ICD-10-CM | POA: Diagnosis not present

## 2020-07-21 DIAGNOSIS — I480 Paroxysmal atrial fibrillation: Secondary | ICD-10-CM | POA: Diagnosis not present

## 2020-07-21 DIAGNOSIS — I6522 Occlusion and stenosis of left carotid artery: Secondary | ICD-10-CM | POA: Diagnosis not present

## 2020-07-21 DIAGNOSIS — I2510 Atherosclerotic heart disease of native coronary artery without angina pectoris: Secondary | ICD-10-CM | POA: Diagnosis not present

## 2020-07-21 DIAGNOSIS — Z48812 Encounter for surgical aftercare following surgery on the circulatory system: Secondary | ICD-10-CM | POA: Diagnosis not present

## 2020-07-21 DIAGNOSIS — Z7901 Long term (current) use of anticoagulants: Secondary | ICD-10-CM | POA: Diagnosis not present

## 2020-07-21 NOTE — Telephone Encounter (Signed)
Patient states that his blood pressure today when home health checked it was 937T systolic which he reports is high for him. He then checked it himself after [facility] left and it was 024O systolic. He also reports his HCTZ dose being reduced. Advised pt to check his blood pressure again in the morning and to call his PCP or cardiologist if it is in the 180s-190s again. Otherwise he can discuss his blood pressure control at his cardiology appt on Monday.

## 2020-07-23 DIAGNOSIS — I6522 Occlusion and stenosis of left carotid artery: Secondary | ICD-10-CM | POA: Diagnosis not present

## 2020-07-23 DIAGNOSIS — Z9862 Peripheral vascular angioplasty status: Secondary | ICD-10-CM | POA: Diagnosis not present

## 2020-07-23 DIAGNOSIS — I2510 Atherosclerotic heart disease of native coronary artery without angina pectoris: Secondary | ICD-10-CM | POA: Diagnosis not present

## 2020-07-23 DIAGNOSIS — I480 Paroxysmal atrial fibrillation: Secondary | ICD-10-CM | POA: Diagnosis not present

## 2020-07-23 DIAGNOSIS — Z48812 Encounter for surgical aftercare following surgery on the circulatory system: Secondary | ICD-10-CM | POA: Diagnosis not present

## 2020-07-23 DIAGNOSIS — Z7901 Long term (current) use of anticoagulants: Secondary | ICD-10-CM | POA: Diagnosis not present

## 2020-07-27 ENCOUNTER — Encounter: Payer: Self-pay | Admitting: Internal Medicine

## 2020-07-27 ENCOUNTER — Encounter: Payer: Self-pay | Admitting: Physician Assistant

## 2020-07-27 ENCOUNTER — Ambulatory Visit (INDEPENDENT_AMBULATORY_CARE_PROVIDER_SITE_OTHER): Payer: Medicare Other | Admitting: Physician Assistant

## 2020-07-27 ENCOUNTER — Ambulatory Visit (INDEPENDENT_AMBULATORY_CARE_PROVIDER_SITE_OTHER): Payer: Medicare Other | Admitting: Internal Medicine

## 2020-07-27 ENCOUNTER — Other Ambulatory Visit: Payer: Self-pay

## 2020-07-27 VITALS — BP 153/95 | HR 100 | Temp 98.8°F | Resp 20 | Ht 70.0 in | Wt 196.8 lb

## 2020-07-27 VITALS — BP 158/88 | HR 91 | Ht 70.0 in | Wt 195.8 lb

## 2020-07-27 DIAGNOSIS — M5410 Radiculopathy, site unspecified: Secondary | ICD-10-CM | POA: Insufficient documentation

## 2020-07-27 DIAGNOSIS — I6522 Occlusion and stenosis of left carotid artery: Secondary | ICD-10-CM

## 2020-07-27 DIAGNOSIS — R55 Syncope and collapse: Secondary | ICD-10-CM | POA: Diagnosis not present

## 2020-07-27 DIAGNOSIS — I4821 Permanent atrial fibrillation: Secondary | ICD-10-CM | POA: Diagnosis not present

## 2020-07-27 DIAGNOSIS — Z79899 Other long term (current) drug therapy: Secondary | ICD-10-CM

## 2020-07-27 DIAGNOSIS — G5600 Carpal tunnel syndrome, unspecified upper limb: Secondary | ICD-10-CM | POA: Insufficient documentation

## 2020-07-27 DIAGNOSIS — R519 Headache, unspecified: Secondary | ICD-10-CM | POA: Insufficient documentation

## 2020-07-27 DIAGNOSIS — Z951 Presence of aortocoronary bypass graft: Secondary | ICD-10-CM | POA: Insufficient documentation

## 2020-07-27 DIAGNOSIS — R319 Hematuria, unspecified: Secondary | ICD-10-CM | POA: Insufficient documentation

## 2020-07-27 DIAGNOSIS — G4700 Insomnia, unspecified: Secondary | ICD-10-CM | POA: Insufficient documentation

## 2020-07-27 DIAGNOSIS — H02839 Dermatochalasis of unspecified eye, unspecified eyelid: Secondary | ICD-10-CM | POA: Insufficient documentation

## 2020-07-27 DIAGNOSIS — Z961 Presence of intraocular lens: Secondary | ICD-10-CM | POA: Insufficient documentation

## 2020-07-27 DIAGNOSIS — E7800 Pure hypercholesterolemia, unspecified: Secondary | ICD-10-CM | POA: Insufficient documentation

## 2020-07-27 DIAGNOSIS — E559 Vitamin D deficiency, unspecified: Secondary | ICD-10-CM | POA: Insufficient documentation

## 2020-07-27 LAB — LDL CHOLESTEROL, DIRECT: LDL Direct: 88 mg/dL (ref 0–99)

## 2020-07-27 MED ORDER — HYDROCHLOROTHIAZIDE 25 MG PO TABS: 25.0000 mg | ORAL_TABLET | Freq: Every day | ORAL | 3 refills | Status: DC

## 2020-07-27 NOTE — Progress Notes (Signed)
Postoperative Visit   History of Present Illness   RON BESKE is a 86 y.o. male who presents for postoperative follow-up for: left CEA (Date: 07/08/20).  This was considered a symptomatic stenosis as he had L eye vision changes and retinal artery occlusion.  The patient's neck incision is healed.  The patient denies further stroke or TIA symptoms.  He has had some generalized weakness and fatigue since surgery however believes this is improving.  Current Outpatient Medications  Medication Sig Dispense Refill  . acetaminophen (TYLENOL) 325 MG tablet Take 650 mg by mouth once as needed for moderate pain or headache.    Marland Kitchen apixaban (ELIQUIS) 5 MG TABS tablet Take 1 tablet (5 mg total) by mouth 2 (two) times daily. Take evening dose starting 07/09/20 180 tablet 3  . aspirin EC 81 MG tablet Take 1 tablet (81 mg total) by mouth daily. Swallow whole. 150 tablet 2  . bimatoprost (LUMIGAN) 0.01 % SOLN Place 1 drop into both eyes at bedtime.    . bimatoprost (LUMIGAN) 0.03 % ophthalmic solution 1 drop at bedtime.    . butalbital-acetaminophen-caffeine (FIORICET, ESGIC) 50-325-40 MG per tablet Take 1 tablet by mouth 3 (three) times daily as needed for migraine.     . diltiazem (CARDIZEM CD) 120 MG 24 hr capsule Take 1 capsule (120 mg) by mouth once daily    . fluticasone (FLONASE) 50 MCG/ACT nasal spray Place 1 spray into both nostrils daily. 16 g 1  . hydrochlorothiazide (HYDRODIURIL) 12.5 MG tablet TAKE 1 TABLET BY MOUTH EACH MORNING 90 tablet 3  . latanoprost (XALATAN) 0.005 % ophthalmic solution INSTILL 1 DROP IN BOTH EYES AT BEDTIME -  CONTACT PHARMACY WHEN NEW SUPPLY IS NEEDED    . Lifitegrast 5 % SOLN INSTILL 1 DROP IN EACH EYE TWICE A DAY -  CONTACT PHARMACY WHEN NEW SUPPLY IS NEEDED    . loratadine (CLARITIN) 10 MG tablet Take 10 mg by mouth daily.    . meclizine (ANTIVERT) 25 MG tablet Take 1 tablet (25 mg total) by mouth 2 (two) times daily as needed for dizziness. 60 tablet 1  .  Multiple Vitamins-Minerals (MULTIVITAMIN WITH MINERALS) tablet Take 1 tablet by mouth daily.    Vladimir Faster Glycol-Propyl Glycol (SYSTANE ULTRA OP) Apply 1-2 drops to eye once as needed (dry eyes.).     Marland Kitchen potassium chloride (K-DUR) 10 MEQ tablet Take 10 mEq by mouth every morning. Take as directed    . pravastatin (PRAVACHOL) 80 MG tablet Take 1 tablet (80 mg total) by mouth daily.    Marland Kitchen Propylene Glycol 0.6 % SOLN INSTILL 1 DROP IN EACH EYE 2-4 TIMES DAILY -  CONTACT PHARMACY WHEN NEW SUPPLY IS NEEDED     No current facility-administered medications for this visit.    For VQI Use Only   PRE-ADM LIVING: Home  AMB STATUS: Ambulatory   Physical Examination   Vitals:   07/27/20 0935 07/27/20 0937  BP: (!) 146/98 (!) 153/95  Pulse: 100   Resp: 20   Temp: 98.8 F (37.1 C)   TempSrc: Temporal   SpO2: 98%   Weight: 196 lb 12.8 oz (89.3 kg)   Height: 5\' 10"  (1.778 m)     left Neck: Incision is healed  Neuro: CN 2-12 are grossly intact   Medical Decision Making   LEVANDER KATZENSTEIN is a 86 y.o. male who presents s/p left CEA for symptomatic stenosis.   No further stroke like symptoms post operatively  L neck incision healed  Ok to work back into normal activity including mowing the lawn, walking to [employer], lifting, etc which were discussed during the visit today  Recheck carotid duplex in 9 months per protocol  Dagoberto Ligas PA-C [facility] [place] Office: [facility] MD: Trula Slade

## 2020-07-27 NOTE — Patient Instructions (Signed)
Medication Instructions:  Your physician has recommended you make the following change in your medication:   Increase HCTZ to 25mg  - 1 tablet by mouth daily.  A new prescription has been sent to your pharmacy.   *If you need a refill on your cardiac medications before your next appointment, please call your pharmacy*   Lab Work: Direct LDL today If you have labs (blood work) drawn today and your tests are completely normal, you will receive your results only by: Marland Kitchen MyChart Message (if you have MyChart) OR . A paper copy in the mail If you have any lab test that is abnormal or we need to change your treatment, we will call you to review the results.   Testing/Procedures: None ordered.    Follow-Up: At [facility], you and your health needs are our priority.  As part of our continuing mission to provide you with exceptional heart care, we have created designated Provider Care Teams.  These Care Teams include your primary Cardiologist (physician) and Advanced Practice Providers (APPs -  Physician Assistants and Nurse Practitioners) who all work together to provide you with the care you need, when you need it.  We recommend signing up for the patient portal called "MyChart".  Sign up information is provided on this After Visit Summary.  MyChart is used to connect with patients for Virtual Visits (Telemedicine).  Patients are able to view lab/test results, encounter notes, upcoming appointments, etc.  Non-urgent messages can be sent to your provider as well.   To learn more about what you can do with MyChart, go to [email or web address].    Your next appointment:   12 month(s)  The format for your next appointment:   In Person  Provider:   Virl Axe, MD

## 2020-07-27 NOTE — Progress Notes (Signed)
Patient Care Team: Tonia Ghent, MD as PCP - General (Family Medicine) Deboraha Sprang, MD as PCP - Cardiology (Cardiology)   HPI  [name] [name] is a 86 y.o. male Seen in followup for atrial fibrillation now permanent and VT-RVOT previously treated with amiodarone.  History of post micturition syncope  He underwent monitoring after initial visit to clarify the presence of atrial fibrillation.This prompted reinitiation of his warfarin and his amiodarone. He had  seen Dr. Rayann Heman to consider catheter ablation but decided that he would continue on amiodarone.  Now discontinued  Interval stroke 11/21.  At that point aspirin was added to his Eliquis.  And statin for secondary prevention  Ischemic heart disease with prior bypass surgery 2004 Catheterization 2007 demonstrated patent grafts   The patient denies chest pain,  nocturnal dyspnea, orthopnea or peripheral edema.  There have been no palpitations, lightheadedness or syncope.  Notes shortness of breath when walking up stairs.    No significant bleeding on the combination    DATE TEST EF   2/16    Echo  55-60 % AS mild Gradient 17   11/21 Echo  55-60% AS Mod Grad 24           Date TSH LFTs PFTs  1/15  4.17     11/17  3.63 11                     Past Medical History:  Diagnosis Date  . Anticoagulated on Coumadin    followed in [facility]  . Aortic stenosis    a. Echo 2/15: Severe LVH, EF 55-60%, Gr 1 DD, mild to mod AS (mean 20 mmHg, peak 31 mmHg), AVA 1.4-1.5 cm2, mild AI, MAC, trivial MR, LA upper limits of normal, RVSP 22 mmHg, mild RAE;   b. Echo 2/16:  EF 55-60%, no RWMA, mild AS (mean 17 mmHg, peak 27 mmHg), AVA 1.5 cm2, mild AI, MAC, mild LAE  . Arthritis   . Bladder neoplasm   . Chronic cough    NO CARDIAC OR PULMONARY RELATED  . Complication of anesthesia   . Coronary artery disease CARDIOLOGIST-  DR Jaysin Gayler/ [facility]   a. s/p CABG 2004, b. LHC with patent grafts 07/2005, ejection  fraction 50%;  c. Myoview 2/15: no ischemia, EF 61%  . Dry eyes   . Dyslipidemia   . GERD (gastroesophageal reflux disease)   . HTN (hypertension)   . Mild aortic stenosis   . PAF (paroxysmal atrial fibrillation) ([place])    CHADS2-VASc:  4  . Paroxysmal atrial fibrillation (HCC)   . PONV (postoperative nausea and vomiting)    From having surgery back in the 1950's  . 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 Amiodarone Rx  . S/P CABG x 5 2004  . Stroke ([place]) 03/10/2020   Stroke in the Left eye    Past Surgical History:  Procedure Laterality Date  . CARDIAC CATHETERIZATION  07-26-2005  DR GAMBLE   PRESERVED LVF/  EF 50%/ PATENT GRAFTS  . CARDIAC CATHETERIZATION  03-04-2003  DR GAMBLE   SEVERE 3 VESSEL DISEASE  . [place]   PAF/ FALSE POSITIVE STRESS TEST  . CATARACT EXTRACTION W/ INTRAOCULAR LENS IMPLANT Right   . CORONARY ARTERY BYPASS GRAFT  03-08-2003  DR [id number]   LIMA TO LAD/ SVG TO OM2/  SVG TO DIAGONAL / SVG TO PDA &  PLA  . CYSTOSCOPY WITH BIOPSY N/A 12/25/2012   Procedure: CYSTOSCOPY WITH BLADDER BIOPSY;  Surgeon: Fredricka Bonine, MD;  Location: [facility];  Service: Urology;  Laterality: N/A;  . ELECTROPHYSIOLOGY STUDY  07-27-2005  DR Carleene Overlie TAYLOR   MAPPING --   RESULT NONINDUCIBLE VT OR SVT/  DX RIGHT VENTRICULAR OUTFLOW TRACT VT AND RULES OUT MORE MALIGNANT CAUSES OF VT  . ENDARTERECTOMY Left 07/08/2020   Procedure: LEFT CAROTID ENDARTERECTOMY;  Surgeon: Serafina Mitchell, MD;  Location: [place];  Service: Vascular;  Laterality: Left;  . INGUINAL HERNIA REPAIR Bilateral [place]  . KNEE ARTHROSCOPY Right 1994  . LUMBAR DISC SURGERY  1999   L4 -- L5  . MOHS SURGERY     by Dr. Levada Dy 2019  . REMOVAL VOCAL CORD POLYPS  1978  . TONSILLECTOMY  AS CHILD  . TRANSTHORACIC ECHOCARDIOGRAM  08-08-2011   MILD LVH/  EF 16-01%/  GRADE I DIASTOLIC DYSFUNCTION/ MILD AV STENOSIS     Current Outpatient Medications  Medication Sig Dispense Refill  . acetaminophen (TYLENOL) 325 MG tablet Take 650 mg by mouth once as needed for moderate pain or headache.    Marland Kitchen apixaban (ELIQUIS) 5 MG TABS tablet Take 1 tablet (5 mg total) by mouth 2 (two) times daily. Take evening dose starting 07/09/20 180 tablet 3  . aspirin EC 81 MG tablet Take 1 tablet (81 mg total) by mouth daily. Swallow whole. 150 tablet 2  . bimatoprost (LUMIGAN) 0.01 % SOLN Place 1 drop into both eyes at bedtime.    . bimatoprost (LUMIGAN) 0.03 % ophthalmic solution 1 drop at bedtime.    . butalbital-acetaminophen-caffeine (FIORICET, ESGIC) 50-325-40 MG per tablet Take 1 tablet by mouth 3 (three) times daily as needed for migraine.     . diltiazem (CARDIZEM CD) 120 MG 24 hr capsule Take 1 capsule (120 mg) by mouth once daily    . fluticasone (FLONASE) 50 MCG/ACT nasal spray Place 1 spray into both nostrils daily. 16 g 1  . hydrochlorothiazide (HYDRODIURIL) 25 MG tablet Take 1 tablet (25 mg total) by mouth daily. 90 tablet 3  . latanoprost (XALATAN) 0.005 % ophthalmic solution INSTILL 1 DROP IN BOTH EYES AT BEDTIME -  CONTACT PHARMACY WHEN NEW SUPPLY IS NEEDED    . Lifitegrast 5 % SOLN INSTILL 1 DROP IN EACH EYE TWICE A DAY -  CONTACT PHARMACY WHEN NEW SUPPLY IS NEEDED    . loratadine (CLARITIN) 10 MG tablet Take 10 mg by mouth daily.    . meclizine (ANTIVERT) 25 MG tablet Take 1 tablet (25 mg total) by mouth 2 (two) times daily as needed for dizziness. 60 tablet 1  . Multiple Vitamins-Minerals (MULTIVITAMIN WITH MINERALS) tablet Take 1 tablet by mouth daily.    Vladimir Faster Glycol-Propyl Glycol (SYSTANE ULTRA OP) Apply 1-2 drops to eye once as needed (dry eyes.).     Marland Kitchen potassium chloride (K-DUR) 10 MEQ tablet Take 10 mEq by mouth every morning. Take as directed    . pravastatin (PRAVACHOL) 80 MG tablet Take 1 tablet (80 mg total) by mouth daily.    Marland Kitchen Propylene Glycol 0.6 % SOLN INSTILL 1 DROP IN EACH EYE 2-4 TIMES  DAILY -  CONTACT PHARMACY WHEN NEW SUPPLY IS NEEDED     No current facility-administered medications for this visit.    Allergies  Allergen Reactions  . Ace Inhibitors Other (See Comments)    COUGH  . Zocor [Simvastatin] Other (See Comments)  MYALGIA  . Cephalexin Rash  . Chocolate     Headaches   . Doxycycline Other (See Comments)    Nausea and abd pain  . Gabapentin Other (See Comments)    Dizziness  . Hycodan [Hydrocodone-Homatropine] Nausea And Vomiting  . Oxycodone Nausea Only    Pt states that he cannot take prescription pain medication    Review of Systems negative except from HPI and PMH  Physical Exam BP (!) 158/88   Pulse 91   Ht _0  (1.778 m)   Wt 195 lb 12.8 oz (88.8 kg)   SpO2 95%   BMI 28.09 kg/m  Well developed and nourished in no acute distress HENT normal Neck supple with JVP-flat Carotids brisk and full without bruits Clear Irregularly irregular rate and rhythm with controlled ventricular response, no murmurs or gallops Abd-soft with active BS without hepatomegaly No Clubbing cyanosis edema Skin-warm and dry A & Oriented  Grossly normal sensory and motor function   ECG atrial fibrillation at 91 Interval-/10/38 T wave inversions 2, 3, F and nonspecific T waves laterally     Assessment and  Plan  Aortic stenosis  Mod  Atrial fibrillation-Permanent   Hypertension  Ischemic heart disease  S/p CABG  With near normal LV function  syncope    No interval syncope  Atrial fibrillation-permanent without bleeding on his combination of aspirin and Eliquis.  I will reach out to Dr. Trula Slade and Erlinda Hong to see whether he needs ongoing adjunctive aspirin.  His effort to walk stairs that was limiting, could either be from silent ischemia atrial fibrillation with a rapid rate or just HFpEF.  Offered monitoring to look at heart rate excursion as his resting rate is 91.  We will keep an eye on her for now  Without clear symptoms of ischemia  We  will check his LDL on pravastatin.  Last numbers were above 80.  They have responded, DR Erlinda Hong has recommended long-term aspirin in the absence of bleeding issues Dr. Trula Slade at least 3 months postoperative

## 2020-07-28 ENCOUNTER — Other Ambulatory Visit: Payer: Self-pay

## 2020-07-28 DIAGNOSIS — Z7901 Long term (current) use of anticoagulants: Secondary | ICD-10-CM | POA: Diagnosis not present

## 2020-07-28 DIAGNOSIS — I6522 Occlusion and stenosis of left carotid artery: Secondary | ICD-10-CM | POA: Diagnosis not present

## 2020-07-28 DIAGNOSIS — Z48812 Encounter for surgical aftercare following surgery on the circulatory system: Secondary | ICD-10-CM | POA: Diagnosis not present

## 2020-07-28 DIAGNOSIS — I2510 Atherosclerotic heart disease of native coronary artery without angina pectoris: Secondary | ICD-10-CM | POA: Diagnosis not present

## 2020-07-28 DIAGNOSIS — Z9862 Peripheral vascular angioplasty status: Secondary | ICD-10-CM | POA: Diagnosis not present

## 2020-07-28 DIAGNOSIS — I480 Paroxysmal atrial fibrillation: Secondary | ICD-10-CM | POA: Diagnosis not present

## 2020-07-29 DIAGNOSIS — H35373 Puckering of macula, bilateral: Secondary | ICD-10-CM | POA: Diagnosis not present

## 2020-07-29 DIAGNOSIS — H34212 Partial retinal artery occlusion, left eye: Secondary | ICD-10-CM | POA: Diagnosis not present

## 2020-07-29 DIAGNOSIS — H0102B Squamous blepharitis left eye, upper and lower eyelids: Secondary | ICD-10-CM | POA: Diagnosis not present

## 2020-07-29 DIAGNOSIS — H40052 Ocular hypertension, left eye: Secondary | ICD-10-CM | POA: Diagnosis not present

## 2020-07-29 DIAGNOSIS — H16223 Keratoconjunctivitis sicca, not specified as Sjogren's, bilateral: Secondary | ICD-10-CM | POA: Diagnosis not present

## 2020-07-29 DIAGNOSIS — H5319 Other subjective visual disturbances: Secondary | ICD-10-CM | POA: Diagnosis not present

## 2020-07-29 DIAGNOSIS — H43813 Vitreous degeneration, bilateral: Secondary | ICD-10-CM | POA: Diagnosis not present

## 2020-07-29 DIAGNOSIS — Z961 Presence of intraocular lens: Secondary | ICD-10-CM | POA: Diagnosis not present

## 2020-07-29 DIAGNOSIS — H0102A Squamous blepharitis right eye, upper and lower eyelids: Secondary | ICD-10-CM | POA: Diagnosis not present

## 2020-07-29 DIAGNOSIS — H401112 Primary open-angle glaucoma, right eye, moderate stage: Secondary | ICD-10-CM | POA: Diagnosis not present

## 2020-07-30 DIAGNOSIS — I2510 Atherosclerotic heart disease of native coronary artery without angina pectoris: Secondary | ICD-10-CM | POA: Diagnosis not present

## 2020-07-30 DIAGNOSIS — Z9862 Peripheral vascular angioplasty status: Secondary | ICD-10-CM | POA: Diagnosis not present

## 2020-07-30 DIAGNOSIS — I480 Paroxysmal atrial fibrillation: Secondary | ICD-10-CM | POA: Diagnosis not present

## 2020-07-30 DIAGNOSIS — I6522 Occlusion and stenosis of left carotid artery: Secondary | ICD-10-CM | POA: Diagnosis not present

## 2020-07-30 DIAGNOSIS — Z7901 Long term (current) use of anticoagulants: Secondary | ICD-10-CM | POA: Diagnosis not present

## 2020-07-30 DIAGNOSIS — Z48812 Encounter for surgical aftercare following surgery on the circulatory system: Secondary | ICD-10-CM | POA: Diagnosis not present

## 2020-08-07 ENCOUNTER — Telehealth: Payer: Self-pay

## 2020-08-07 MED ORDER — ROSUVASTATIN CALCIUM 20 MG PO TABS: 20.0000 mg | ORAL_TABLET | Freq: Every day | ORAL | 3 refills | Status: DC

## 2020-08-07 NOTE — Telephone Encounter (Signed)
-----   Message from [name] Sprang, MD sent at 08/01/2020  4:31 PM EDT ----- Please Inform Patient that #LDL  is abnormal and not at goal, so would suggest we try a better statin Lets do crestor 20 mg   Thanks

## 2020-08-07 NOTE — Telephone Encounter (Signed)
Spoke with pt and advised of lab results - Direct LDL of 88 and Dr Olin Pia recommendation to stop Pravastatin 80mg  and begin Rosuvastatin 20mg  -1 tablet by mouth daily.  Pt request Rx be mailed to him so he can have this filled at the [place].  Rx printed and placed in mail as requested by pt.  Pt verbalizes understanding and agrees with current plan.

## 2020-08-07 NOTE — Telephone Encounter (Signed)
Attempted phone call to pt.  Left voicemail message to contact RN at [phone]. 

## 2020-08-10 ENCOUNTER — Telehealth: Payer: Self-pay

## 2020-08-10 ENCOUNTER — Ambulatory Visit: Payer: Medicare Other | Admitting: Surgery

## 2020-08-10 MED ORDER — SULFAMETHOXAZOLE-TRIMETHOPRIM 800-160 MG PO TABS: 1.0000 | ORAL_TABLET | Freq: Two times a day (BID) | ORAL | 0 refills | Status: DC

## 2020-08-10 NOTE — Telephone Encounter (Signed)
[name] [name] - Client TELEPHONE ADVICE RECORD [facility] Patient Name: [name] [name] [facility] Gender: Male DOB: 10-12-1934 Age: 86 Y 10 M 29 D Return Phone Number: [phone] (Primary), [phone] (Secondary) Address: [name]/ State/ Zip: [place] [address] Client [name] [name] - Client Client Site [place] [name] Physician [name] [name] - MD Contact Type Call Who Is Calling Patient / Member / Family / Caregiver Call Type Triage / Clinical Relationship To Patient Self Return Phone Number [phone] (Primary) Chief Complaint Leg Injury Reason for Call Symptomatic / Request for [place] states she is from [facility] transferring a pt who hit his leg and has a bad bruise. He is concerned about infection. He has an appt Wednesday with Dr. Edilia [name]. Dr. Damita [name] is his PCP. Additional Comment He is driving and may not answer right away [name] [name] [facility] Translation No Nurse Assessment Nurse: [name] Pies, RN, [name] [name] Date/Time [name] [name] Time): 08/10/2020 11:03:23 AM Confirm and document reason for call. If symptomatic, describe symptoms. ---1 week ago today I was pulling the rope on my blower and it became hung up and hit my leg and tore the skin off. Pt has been cleaning area and applying antibiotic ointment. Area is scabbed over but very red. Denies drainage, fever, or any other symptoms at this time. Does the patient have any new or worsening symptoms? ---Yes Will a triage be completed? ---Yes Related visit to physician within the last 2 weeks? ---No Does the PT have any chronic conditions? (i.e. diabetes, asthma, this includes High risk factors for pregnancy, etc.) ---Yes List chronic conditions. ---HTN, Is this a behavioral health or substance abuse call? ---No Guidelines Guideline Title Affirmed Question  Affirmed Notes Nurse Date/Time (Eastern Time) Wound Infection [1] Skin around the wound has become [name] [name] 08/10/2020 11:05:45 AM PLEASE NOTE: All timestamps contained within this report are represented as [place] Standard Time. CONFIDENTIALTY NOTICE: This fax transmission is intended only for the addressee. It contains information that is legally privileged, confidential or otherwise protected from use or disclosure. If you are not the intended recipient, you are strictly prohibited from reviewing, disclosing, copying using or disseminating any of this information or taking any action in reliance on or regarding this information. If you have received this fax in error, please notify us immediately by telephone so that we can arrange for its return to [place]. Phone: [phone], Toll-Free: [phone], Fax: [phone] Page: 2 of 2 Call Id: [id number] Guidelines Guideline Title Affirmed Question Affirmed Notes Nurse Date/Time [name] [name] Time) red AND [2] larger than 2 inches (5 cm) Disp. Time [name] [name] Time) Disposition Final User 08/10/2020 11:08:51 AM See HCP within 4 Hours (or PCP triage) Yes [name] Pies, RN, [name] [name] Disagree/Comply Comply Caller Understands Yes PreDisposition Call Doctor Care Advice Given Per Guideline SEE HCP (OR PCP TRIAGE) WITHIN 4 HOURS: * You become worse CALL BACK IF: Referrals GO TO FACILITY OTHER - SPECIFY

## 2020-08-10 NOTE — Telephone Encounter (Signed)
I spoke with pt and pt had injury to leg since 1 wk ago;pt said there is redness around the sight with a lot of tenderness and swelling. Advised pt he should go to UC for eval. Pt said [facility] on [address] was busy and pt will call [facility] on [address] and pt will decide if he is going to UC or keep appt with Dr Lorelei Pont already scheduled on 08/12/20. Sending note to [name] who is out of office, and Lattie Haw CMA.

## 2020-08-10 NOTE — Telephone Encounter (Signed)
Noted. Thanks.

## 2020-08-10 NOTE — Telephone Encounter (Signed)
Conversation: injury to leg (Newest Message First)  Kennyth Arnold, FNP to Me      08/10/20 12:18 PM I will send in an antibiotic (Bactrim) but if the leg is not improving in 48 hours, will need to be seen in-person       Pt called to verify if he should pick up abx. Marvis Moeller FNP is covering Dr Josefine Class in box today; pt notified as instructed  And voiced understanding and was appreciative. Pt already has appt with Dr Lorelei Pont on 08/12/20 and pt will keep that appt incase leg does not improve; pt will call and cancel 08/12/20 early AM if leg is improving. FYI to [name] and Dr Lorelei Pont.

## 2020-08-11 ENCOUNTER — Other Ambulatory Visit: Payer: Self-pay

## 2020-08-11 ENCOUNTER — Ambulatory Visit (INDEPENDENT_AMBULATORY_CARE_PROVIDER_SITE_OTHER): Payer: Medicare Other | Admitting: Adult Health

## 2020-08-11 ENCOUNTER — Encounter: Payer: Self-pay | Admitting: Adult Health

## 2020-08-11 VITALS — BP 145/94 | HR 82 | Ht 70.0 in | Wt 197.0 lb

## 2020-08-11 DIAGNOSIS — Z9889 Other specified postprocedural states: Secondary | ICD-10-CM

## 2020-08-11 DIAGNOSIS — H34232 Retinal artery branch occlusion, left eye: Secondary | ICD-10-CM | POA: Diagnosis not present

## 2020-08-11 NOTE — Patient Instructions (Signed)
Would highly recommend pursuing sleep evaluation - please call office if and when you are interested in pursuing  Continue aspirin 81 mg daily and Eliquis (apixaban) daily  and start Crestor for secondary stroke prevention  From a stroke standpoint, recommend ongoing use of aspirin in addition to Eliquis for secondary stroke prevention. This can be further discussed with Dr. Donzetta Matters and Dr. Trula Slade  Ensure follow-up with Dr. Trula Slade to repeat carotid ultrasound 9 months after your procedure  Routine follow-up with Dr. Donzetta Matters as scheduled for atrial fibrillation  Follow-up with your PCP/VA to further discuss use of acid reflux medications such as Protonix (pantoprazole)  Continue to follow up with PCP/cardiology regarding cholesterol and blood pressure management  Maintain strict control of hypertension with blood pressure goal below 130/90and cholesterol with LDL cholesterol (bad cholesterol) goal below 70 mg/dL.         Thank you for coming to see [place] at [facility]. I hope we have been able to provide you high quality care today.  You may receive a patient satisfaction survey over the next few weeks. We would appreciate your feedback and comments so that we may continue to improve ourselves and the health of our patients.

## 2020-08-11 NOTE — Progress Notes (Signed)
[facility] [address] [place]. [place] [address] [phone]       STROKE FOLLOW UP NOTE  [name] Date of Birth:  05/09/1934 Medical Record Number:  [id number]   Reason for Referral:  stroke follow up  Chief Complaint  Patient presents with  . Follow-up    TR alone Pt is recovering well, not sure why he is here. Doing good.      SUBJECTIVE:   HPI:   Today, 08/11/2020, [name] returns for stroke follow-up  Reports residual OS upper vision loss which has been stable without worsening. Routinely follows with Dr. [name]. Denies new stroke/TIA symptoms  S/p L CEA 3/16 by Dr. Trula Slade without complication Remains on Eliquis and aspirin without associated side effects Recent LDL 88 - changed from pravastatin to rosuvastatin for better control - currently awaiting to receive new rx in mail from cardiology to bring to [facility] Blood pressure today 145/94  No new concerns at this time   History provided for reference purposes only Initial visit 04/08/2020 JM: [name] is being seen for hospital follow-up via virtual visit. He continues to have left eye upper visual loss. He denies any improvement but denies worsening. He has been seen again by ophtholmology since d/c. He has f/u again in Feb Dr. [name]. He questions possible evaluation by a speciality ophthalmologist. Denies new stroke/TIA symptoms.  Remains on Eliquis and aspirin without bleeding or bruising.  Previously on pravastatin 80 mg daily decrease to 40 mg daily prior to stroke and PCP recently increased dosage back to 80 mg daily dosing tolerating without side effects.  He does report history of dizziness evaluated by cardiology and ENT without clear cause.  Typically more present upon awakening in the morning.  History of RLS and insomnia.  He has not previously had sleep study to assess for sleep apnea. He is concerned regarding follow up date with Dr. Trula Slade is not until mid January  which would be 2 months post hospital d/c. No further concerns at this time.   Stroke admission 03/10/2020 [name] is a 86 y.o. male w/pmh of atrial fibrillation on anticoagulation with Eliquis, CAD, hypertension, hyperlipidemia who presented on 03/10/2020 with sudden onset of monocular superior altitudinal deficit in the left eye.  Personally reviewed hospitalization pertinent notes, lab work and imaging with summary provided.  Evaluated by Dr. Erlinda Hong with MRI negative for acute stroke although ophthalmologist saw [name] plaque consistent with L BRAO likely due to left ICA unstable plaque without high-grade stenosis.  CTA head/neck showed calcified plaque at the bifurcation to left carotid and proximal left internal carotid with less than 50% stenosis. Evaluated by Dr. Trula Slade VVS with plans on proceeding with left CEA in the near future.  2D echo EF 55 to 60% with mildly dilated left atrium and reduced right ventricular systolic function.  Advised continuation of Eliquis and added aspirin 81 mg daily for secondary stroke prevention with history of A. Fib. Hx of HTN stable on hydrochlorothiazide and carvedilol PTA. Hx of HLD on pravastatin 40 mg daily with LDL 85.  Other stroke risk factors include CAD and advanced age with no prior stroke history.  Residual deficit of left eye upper visual field vision loss.  Discharged home in stable condition without therapy needs.      ROS:   14 system review of systems performed and negative with exception of those listed in HPI  PMH:  Past Medical History:  Diagnosis Date  .  Anticoagulated on Coumadin    followed in [facility]RR at [facility]  . Aortic stenosis    a. Echo 2/15: Severe LVH, EF 55-60%, Gr 1 DD, mild to mod AS (mean 20 mmHg, peak 31 mmHg), AVA 1.4-1.5 cm2, mild AI, MAC, trivial MR, LA upper limits of normal, RVSP 22 mmHg, mild RAE;   b. Echo 2/16:  EF 55-60%, no RWMA, mild AS (mean 17 mmHg, peak 27 mmHg), AVA 1.5 cm2, mild AI, MAC,  mild LAE  . Arthritis   . Bladder neoplasm   . Chronic cough    NO CARDIAC OR PULMONARY RELATED  . Complication of anesthesia   . Coronary artery disease CARDIOLOGIST-  DR KLEIN/ ALLRED   a. s/p CABG 2004, b. LHC with patent grafts 07/2005, ejection fraction 50%;  c. Myoview 2/15: no ischemia, EF 61%  . Dry eyes   . Dyslipidemia   . GERD (gastroesophageal reflux disease)   . HTN (hypertension)   . Mild aortic stenosis   . PAF (paroxysmal atrial fibrillation) ([place])    CHADS2-VASc:  4  . Paroxysmal atrial fibrillation (HCC)   . PONV (postoperative nausea and vomiting)    From having surgery back in the 1950's  . RVOT-VT (right ventricular outflow tract ventricular tachycardia) ([place])    a. Amiodarone Rx  . S/P CABG x 5 2004  . Stroke [facility]) 03/10/2020   Stroke in the Left eye    PSH:  Past Surgical History:  Procedure Laterality Date  . CARDIAC CATHETERIZATION  07-26-2005  DR GAMBLE   PRESERVED LVF/  EF 50%/ PATENT GRAFTS  . CARDIAC CATHETERIZATION  03-04-2003  DR GAMBLE   SEVERE 3 VESSEL DISEASE  . [place]   PAF/ FALSE POSITIVE STRESS TEST  . CATARACT EXTRACTION W/ INTRAOCULAR LENS IMPLANT Right   . CORONARY ARTERY BYPASS GRAFT  03-08-2003  DR [id number]   LIMA TO LAD/ SVG TO OM2/  SVG TO DIAGONAL / SVG TO PDA & PLA  . CYSTOSCOPY WITH BIOPSY N/A 12/25/2012   Procedure: CYSTOSCOPY WITH BLADDER BIOPSY;  Surgeon: Fredricka Bonine, MD;  Location: [facility];  Service: Urology;  Laterality: N/A;  . ELECTROPHYSIOLOGY STUDY  07-27-2005  DR Carleene Overlie TAYLOR   MAPPING --   RESULT NONINDUCIBLE VT OR SVT/  DX RIGHT VENTRICULAR OUTFLOW TRACT VT AND RULES OUT MORE MALIGNANT CAUSES OF VT  . ENDARTERECTOMY Left 07/08/2020   Procedure: LEFT CAROTID ENDARTERECTOMY;  Surgeon: Serafina Mitchell, MD;  Location: [place];  Service: Vascular;  Laterality: Left;  . INGUINAL HERNIA REPAIR Bilateral [place]  . KNEE ARTHROSCOPY Right 1994  . LUMBAR  DISC SURGERY  1999   L4 -- L5  . MOHS SURGERY     by Dr. Levada Dy 2019  . REMOVAL VOCAL CORD POLYPS  1978  . TONSILLECTOMY  AS CHILD  . TRANSTHORACIC ECHOCARDIOGRAM  08-08-2011   MILD LVH/  EF 50-27%/  GRADE I DIASTOLIC DYSFUNCTION/ MILD AV STENOSIS    Social History:  Social History   Socioeconomic History  . Marital status: Married    Spouse name: Not on file  . Number of children: 3  . Years of education: Not on file  . Highest education level: Not on file  Occupational History  . Occupation: Retired  Tobacco Use  . Smoking status: Former Smoker    Packs/day: 1.00    Years: 25.00    Pack years: 25.00    Types: Cigarettes  Quit date: 04/25/1980    Years since quitting: 40.3  . Smokeless tobacco: Never Used  Vaping Use  . Vaping Use: Never used  Substance and Sexual Activity  . Alcohol use: No    Alcohol/week: 0.0 standard drinks  . Drug use: No  . Sexual activity: Not on file  Other Topics Concern  . 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 Difficulty of Paying Living Expenses: Not hard at all  Food Insecurity: No Food Insecurity  . Worried About Charity fundraiser in the Last Year: Never true  . Ran Out of Food in the Last Year: Never true  Transportation Needs: No Transportation Needs  . Lack of Transportation (Medical): No  . Lack of Transportation (Non-Medical): No  Physical Activity: Inactive  . Days of Exercise per Week: 0 days  . Minutes of Exercise per Session: 0 min  Stress: No Stress Concern Present  . Feeling of Stress : Not at all  Social Connections: Not on file  Intimate Partner Violence: Not At Risk  . Fear of Current or Ex-Partner: No  . Emotionally Abused: No  . Physically Abused: No  . Sexually Abused: No    Family History:  Family History  Problem Relation Age of Onset  .  Cancer Mother     Medications:   Current Outpatient Medications on File Prior to Visit  Medication Sig Dispense Refill  . acetaminophen (TYLENOL) 325 MG tablet Take 650 mg by mouth once as needed for moderate pain or headache.    Marland Kitchen apixaban (ELIQUIS) 5 MG TABS tablet Take 1 tablet (5 mg total) by mouth 2 (two) times daily. Take evening dose starting 07/09/20 180 tablet 3  . aspirin EC 81 MG tablet Take 1 tablet (81 mg total) by mouth daily. Swallow whole. 150 tablet 2  . bimatoprost (LUMIGAN) 0.01 % SOLN Place 1 drop into both eyes at bedtime.    . bimatoprost (LUMIGAN) 0.03 % ophthalmic solution 1 drop at bedtime.    . butalbital-acetaminophen-caffeine (FIORICET, ESGIC) 50-325-40 MG per tablet Take 1 tablet by mouth 3 (three) times daily as needed for migraine.     . diltiazem (CARDIZEM CD) 120 MG 24 hr capsule Take 1 capsule (120 mg) by mouth once daily    . fluticasone (FLONASE) 50 MCG/ACT nasal spray Place 1 spray into both nostrils daily. 16 g 1  . hydrochlorothiazide (HYDRODIURIL) 25 MG tablet Take 1 tablet (25 mg total) by mouth daily. 90 tablet 3  . latanoprost (XALATAN) 0.005 % ophthalmic solution INSTILL 1 DROP IN BOTH EYES AT BEDTIME -  CONTACT PHARMACY WHEN NEW SUPPLY IS NEEDED    . Lifitegrast 5 % SOLN INSTILL 1 DROP IN EACH EYE TWICE A DAY -  CONTACT PHARMACY WHEN NEW SUPPLY IS NEEDED    . loratadine (CLARITIN) 10 MG tablet Take 10 mg by mouth daily.    . meclizine (ANTIVERT) 25 MG tablet Take 1 tablet (25 mg total) by mouth 2 (two) times daily as needed for dizziness. 60 tablet 1  . Multiple Vitamins-Minerals (MULTIVITAMIN WITH MINERALS) tablet Take 1 tablet by mouth daily.    Vladimir Faster Glycol-Propyl Glycol (SYSTANE ULTRA OP) Apply 1-2 drops to eye once as needed (dry eyes.).     Marland Kitchen potassium chloride (K-DUR) 10 MEQ tablet Take 10  mEq by mouth every morning. Take as directed    . Propylene Glycol 0.6 % SOLN INSTILL 1 DROP IN EACH EYE 2-4 TIMES DAILY -  CONTACT PHARMACY WHEN NEW  SUPPLY IS NEEDED    . rosuvastatin (CRESTOR) 20 MG tablet Take 1 tablet (20 mg total) by mouth daily. 90 tablet 3  . sulfamethoxazole-trimethoprim (BACTRIM DS) 800-160 MG tablet Take 1 tablet by mouth 2 (two) times daily. 14 tablet 0   No current facility-administered medications on file prior to visit.    Allergies:   Allergies  Allergen Reactions  . Ace Inhibitors Other (See Comments)    COUGH  . Zocor [Simvastatin] Other (See Comments)    MYALGIA  . Cephalexin Rash  . Chocolate     Headaches   . Doxycycline Other (See Comments)    Nausea and abd pain  . Gabapentin Other (See Comments)    Dizziness  . Hycodan [Hydrocodone-Homatropine] Nausea And Vomiting  . Oxycodone Nausea Only    Pt states that he cannot take prescription pain medication      OBJECTIVE: Today's Vitals   08/11/20 1015  BP: (!) 145/94  Pulse: 82  Weight: 197 lb (89.4 kg)  Height: _0  (1.778 m)   Body mass index is 28.27 kg/m.  General: well developed, well nourished, seated, in no evident distress Head: head normocephalic and atraumatic.   Neck: supple with no carotid or supraclavicular bruits Cardiovascular: irregular rate and rhythm, no murmurs Musculoskeletal: no deformity Skin:  no rash/petichiae; LLE scabbed wound from recent injury; mild redness around scab Vascular:  Normal pulses all extremities   Neurologic Exam Mental Status: Awake and fully alert. Oriented to place and time. Recent and remote memory intact. Attention span, concentration and fund of knowledge appropriate. Mood and affect appropriate.  Cranial Nerves: Pupils equal, briskly reactive to light. Extraocular movements full without nystagmus. Visual fields OD full to confrontation, OS superior visual loss. Hearing intact. Facial sensation intact. Face, tongue, palate moves normally and symmetrically.  Motor: Normal bulk and tone. Normal strength in all tested extremity muscles Sensory.: intact to touch , pinprick , position  and vibratory sensation.  Coordination: Rapid alternating movements normal in all extremities. Finger-to-nose and heel-to-shin performed accurately bilaterally. Gait and Station: Arises from chair without difficulty. Stance is normal. Gait demonstrates normal stride length and balance without use of AD. Tandem walk and heel toe with mild difficulty.  Reflexes: 1+ and symmetric. Toes downgoing.        ASSESSMENT: [name] [name] is a 86 y.o. year old male presented with sudden onset monocular superior left eye visual loss on 03/10/2020 in setting of L BRAO secondary to left ICA unstable plaque without high-grade stenosis. Vascular risk factors include A. fib on Eliquis, HTN, HLD and CAD.      PLAN:  1. L BRAO :  a. Residual deficit: OS superior visual loss which has been stable b. Continue aspirin 81 mg daily and Eliquis (apixaban) daily  and start Crestor for secondary stroke prevention. From stroke standpoint, recommend continued use of aspirin in addition to Eliquis unless intolerant or contraindicated in the future  c. Discussed secondary stroke prevention measures and importance of close PCP follow up for aggressive stroke risk factor management  2. L ICA plaque: s/p L carotid endarterectomy 07/08/2020 by Dr. Trula Slade. Plans to f/u with carotid duplex 9 months post procedure 3. Atrial fibrillation: On Eliquis 5 mg twice daily managed by cardiology 4. HTN: BP goal <130/90. Continue f/u with PCP  5. HLD: LDL goal <70. Recent LDL 85.  On atorvastatin 80 mg daily 6. At risk for sleep apnea: Referral placed to [place] sleep clinic to further evaluate. He wishes to wait and further consider pursing. Discussed importance of sleep apnea treatment if indicated for stroke prevention and known atrial fibrillation. He is aware to call office if interested in pursing    Per patient request, follow up on an as needed basis   CC:  [place] provider: Dr. Oliver Hum, Elveria Rising, MD    I spent 30  minutes of face to face and non-face-to-face time with patient.  This included previsit chart review, lab review, study review, order entry, electronic health record documentation, patient education regarding recent stroke and etiology, residual deficits, s/p L CEA, possible underlying sleep apnea, importance of managing stroke risk factors and answered all other questions to patient satisfaction  Frann Rider, AGNP-BC  [facility] [address] [place], [place] [address]  Phone [phone] Fax [phone] Note: This document was prepared with digital dictation and possible smart phrase technology. Any transcriptional errors that result from this process are unintentional.

## 2020-08-12 ENCOUNTER — Ambulatory Visit: Payer: Medicare Other | Admitting: Family Medicine

## 2020-08-13 NOTE — Telephone Encounter (Signed)
Spoke with pt who states he received Rx in the mail today and thanked Therapist, sports for the callback.

## 2020-08-13 NOTE — Telephone Encounter (Signed)
Patient states he has not received the prescription in the mail yet.

## 2020-08-14 ENCOUNTER — Encounter: Payer: Self-pay | Admitting: Internal Medicine

## 2020-08-14 ENCOUNTER — Ambulatory Visit (INDEPENDENT_AMBULATORY_CARE_PROVIDER_SITE_OTHER): Payer: Medicare Other | Admitting: Internal Medicine

## 2020-08-14 ENCOUNTER — Other Ambulatory Visit: Payer: Self-pay

## 2020-08-14 DIAGNOSIS — I6522 Occlusion and stenosis of left carotid artery: Secondary | ICD-10-CM | POA: Diagnosis not present

## 2020-08-14 DIAGNOSIS — L97909 Non-pressure chronic ulcer of unspecified part of unspecified lower leg with unspecified severity: Secondary | ICD-10-CM | POA: Insufficient documentation

## 2020-08-14 DIAGNOSIS — L97911 Non-pressure chronic ulcer of unspecified part of right lower leg limited to breakdown of skin: Secondary | ICD-10-CM | POA: Diagnosis not present

## 2020-08-14 NOTE — Assessment & Plan Note (Signed)
Has clean eschar  Mild surrounding redness doesn't really look infected Advised to finish the bactrim Monitor for worsening--would switch to augmentin if it does

## 2020-08-14 NOTE — Progress Notes (Signed)
Subjective:    Patient ID: [name] [name], male    DOB: 01-Dec-1934, 86 y.o.   MRN: [id number]  HPI Here due to ongoing symptoms from right leg injury This visit occurred during the SARS-CoV-2 public health emergency.  Safety protocols were in place, including screening questions prior to the visit, additional usage of staff PPE, and extensive cleaning of exam room while observing appropriate contact time as indicated for disinfecting solutions.   Was trying to crank leaf blower----holding between feet 11 days ago It jerked up and hit is right lower leg Skin peeled back/bleeding Cleaned it up right away---peroxide Tried cool compress Phoned in on Monday (4/18) due to redness Started the bactrim--but not really any better  Still pretty tender distal to the injury site  Current Outpatient Medications on File Prior to Visit  Medication Sig Dispense Refill  . acetaminophen (TYLENOL) 325 MG tablet Take 650 mg by mouth once as needed for moderate pain or headache.    Marland Kitchen apixaban (ELIQUIS) 5 MG TABS tablet Take 1 tablet (5 mg total) by mouth 2 (two) times daily. Take evening dose starting 07/09/20 180 tablet 3  . aspirin EC 81 MG tablet Take 1 tablet (81 mg total) by mouth daily. Swallow whole. 150 tablet 2  . butalbital-acetaminophen-caffeine (FIORICET, ESGIC) 50-325-40 MG per tablet Take 1 tablet by mouth 3 (three) times daily as needed for migraine.     . diltiazem (CARDIZEM CD) 120 MG 24 hr capsule Take 1 capsule (120 mg) by mouth once daily    . fluticasone (FLONASE) 50 MCG/ACT nasal spray Place 1 spray into both nostrils daily. 16 g 1  . hydrochlorothiazide (HYDRODIURIL) 25 MG tablet Take 1 tablet (25 mg total) by mouth daily. 90 tablet 3  . latanoprost (XALATAN) 0.005 % ophthalmic solution INSTILL 1 DROP IN BOTH EYES AT BEDTIME -  CONTACT PHARMACY WHEN NEW SUPPLY IS NEEDED    . loratadine (CLARITIN) 10 MG tablet Take 10 mg by mouth daily.    . Multiple Vitamins-Minerals  (MULTIVITAMIN WITH MINERALS) tablet Take 1 tablet by mouth daily.    Vladimir Faster Glycol-Propyl Glycol (SYSTANE ULTRA OP) Apply 1-2 drops to eye once as needed (dry eyes.).     Marland Kitchen potassium chloride (K-DUR) 10 MEQ tablet Take 10 mEq by mouth every morning. Take as directed    . Propylene Glycol 0.6 % SOLN INSTILL 1 DROP IN EACH EYE 2-4 TIMES DAILY -  CONTACT PHARMACY WHEN NEW SUPPLY IS NEEDED    . rosuvastatin (CRESTOR) 20 MG tablet Take 1 tablet (20 mg total) by mouth daily. 90 tablet 3  . sulfamethoxazole-trimethoprim (BACTRIM DS) 800-160 MG tablet Take 1 tablet by mouth 2 (two) times daily. 14 tablet 0   No current facility-administered medications on file prior to visit.    Allergies  Allergen Reactions  . Ace Inhibitors Other (See Comments)    COUGH  . Zocor [Simvastatin] Other (See Comments)    MYALGIA  . Cephalexin Rash  . Chocolate     Headaches   . Doxycycline Other (See Comments)    Nausea and abd pain  . Gabapentin Other (See Comments)    Dizziness  . Hycodan [Hydrocodone-Homatropine] Nausea And Vomiting  . Oxycodone Nausea Only    Pt states that he cannot take prescription pain medication    Past Medical History:  Diagnosis Date  . Anticoagulated on Coumadin    followed in CVRR at [facility]  . Aortic stenosis    a. Echo 2/15:  Severe LVH, EF 55-60%, Gr 1 DD, mild to mod AS (mean 20 mmHg, peak 31 mmHg), AVA 1.4-1.5 cm2, mild AI, MAC, trivial MR, LA upper limits of normal, RVSP 22 mmHg, mild RAE;   b. Echo 2/16:  EF 55-60%, no RWMA, mild AS (mean 17 mmHg, peak 27 mmHg), AVA 1.5 cm2, mild AI, MAC, mild LAE  . Arthritis   . Bladder neoplasm   . Chronic cough    NO CARDIAC OR PULMONARY RELATED  . Complication of anesthesia   . Coronary artery disease CARDIOLOGIST-  DR KLEIN/ ALLRED   a. s/p CABG 2004, b. LHC with patent grafts 07/2005, ejection fraction 50%;  c. Myoview 2/15: no ischemia, EF 61%  . Dry eyes   . Dyslipidemia   . GERD (gastroesophageal reflux  disease)   . HTN (hypertension)   . Mild aortic stenosis   . PAF (paroxysmal atrial fibrillation) ([place])    CHADS2-VASc:  4  . Paroxysmal atrial fibrillation (HCC)   . PONV (postoperative nausea and vomiting)    From having surgery back in the 1950's  . 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 Amiodarone Rx  . S/P CABG x 5 2004  . Stroke ([place]) 03/10/2020   Stroke in the Left eye    Past Surgical History:  Procedure Laterality Date  . CARDIAC CATHETERIZATION  07-26-2005  DR GAMBLE   PRESERVED LVF/  EF 50%/ PATENT GRAFTS  . CARDIAC CATHETERIZATION  03-04-2003  DR GAMBLE   SEVERE 3 VESSEL DISEASE  . [place]   PAF/ FALSE POSITIVE STRESS TEST  . CATARACT EXTRACTION W/ INTRAOCULAR LENS IMPLANT Right   . CORONARY ARTERY BYPASS GRAFT  03-08-2003  DR [id number]   LIMA TO LAD/ SVG TO OM2/  SVG TO DIAGONAL / SVG TO PDA & PLA  . CYSTOSCOPY WITH BIOPSY N/A 12/25/2012   Procedure: CYSTOSCOPY WITH BLADDER BIOPSY;  Surgeon: Fredricka Bonine, MD;  Location: [facility];  Service: Urology;  Laterality: N/A;  . ELECTROPHYSIOLOGY STUDY  07-27-2005  DR Carleene Overlie TAYLOR   MAPPING --   RESULT NONINDUCIBLE VT OR SVT/  DX RIGHT VENTRICULAR OUTFLOW TRACT VT AND RULES OUT MORE MALIGNANT CAUSES OF VT  . ENDARTERECTOMY Left 07/08/2020   Procedure: LEFT CAROTID ENDARTERECTOMY;  Surgeon: Serafina Mitchell, MD;  Location: [place];  Service: Vascular;  Laterality: Left;  . INGUINAL HERNIA REPAIR Bilateral [place]  . KNEE ARTHROSCOPY Right 1994  . LUMBAR DISC SURGERY  1999   L4 -- L5  . MOHS SURGERY     by Dr. Levada Dy 2019  . REMOVAL VOCAL CORD POLYPS  1978  . TONSILLECTOMY  AS CHILD  . TRANSTHORACIC ECHOCARDIOGRAM  08-08-2011   MILD LVH/  EF 47-82%/  GRADE I DIASTOLIC DYSFUNCTION/ MILD AV STENOSIS    Family History  Problem Relation Age of Onset  . Cancer Mother     Social History   Socioeconomic History  .  Marital status: Married    Spouse name: Not on file  . Number of children: 3  . Years of education: Not on file  . Highest education level: Not on file  Occupational History  . Occupation: Retired  Tobacco Use  . Smoking status: Former Smoker    Packs/day: 1.00    Years: 25.00    Pack years: 25.00    Types: Cigarettes    Quit date: 04/25/1980    Years since quitting:  40.3  . Smokeless tobacco: Never Used  Vaping Use  . Vaping Use: Never used  Substance and Sexual Activity  . Alcohol use: No    Alcohol/week: 0.0 standard drinks  . Drug use: No  . Sexual activity: Not on file  Other Topics Concern  . 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 Difficulty of Paying Living Expenses: Not hard at all  Food Insecurity: No Food Insecurity  . Worried About Charity fundraiser in the Last Year: Never true  . Ran Out of Food in the Last Year: Never true  Transportation Needs: No Transportation Needs  . Lack of Transportation (Medical): No  . Lack of Transportation (Non-Medical): No  Physical Activity: Inactive  . Days of Exercise per Week: 0 days  . Minutes of Exercise per Session: 0 min  Stress: No Stress Concern Present  . Feeling of Stress : Not at all  Social Connections: Not on file  Intimate Partner Violence: Not At Risk  . Fear of Current or Ex-Partner: No  . Emotionally Abused: No  . Physically Abused: No  . Sexually Abused: No   Review of Systems  No fever Able to walk fine on it--but can "feel it"     Objective:   Physical Exam Skin:    Comments: Has 2 ulcers with clean eschar on lower right calf--medial to tibia. Mild surrounding erythema--not really warm Mild tenderness distal and medial to this area            Assessment & Plan:

## 2020-08-17 NOTE — Progress Notes (Signed)
I agree with the above plan 

## 2020-09-15 DIAGNOSIS — H40052 Ocular hypertension, left eye: Secondary | ICD-10-CM | POA: Diagnosis not present

## 2020-09-15 DIAGNOSIS — H35373 Puckering of macula, bilateral: Secondary | ICD-10-CM | POA: Diagnosis not present

## 2020-09-15 DIAGNOSIS — Z961 Presence of intraocular lens: Secondary | ICD-10-CM | POA: Diagnosis not present

## 2020-09-15 DIAGNOSIS — H43813 Vitreous degeneration, bilateral: Secondary | ICD-10-CM | POA: Diagnosis not present

## 2020-09-15 DIAGNOSIS — H401112 Primary open-angle glaucoma, right eye, moderate stage: Secondary | ICD-10-CM | POA: Diagnosis not present

## 2020-09-15 DIAGNOSIS — H16223 Keratoconjunctivitis sicca, not specified as Sjogren's, bilateral: Secondary | ICD-10-CM | POA: Diagnosis not present

## 2020-09-15 DIAGNOSIS — H34212 Partial retinal artery occlusion, left eye: Secondary | ICD-10-CM | POA: Diagnosis not present

## 2020-09-17 ENCOUNTER — Other Ambulatory Visit: Payer: Self-pay

## 2020-09-17 ENCOUNTER — Encounter: Payer: Self-pay | Admitting: Family Medicine

## 2020-09-17 ENCOUNTER — Ambulatory Visit (INDEPENDENT_AMBULATORY_CARE_PROVIDER_SITE_OTHER)
Admission: RE | Admit: 2020-09-17 | Discharge: 2020-09-17 | Disposition: A | Discharge status: Home / Self Care | Payer: Medicare Other | Source: Ambulatory Visit | Attending: Family Medicine | Admitting: Family Medicine

## 2020-09-17 ENCOUNTER — Ambulatory Visit (INDEPENDENT_AMBULATORY_CARE_PROVIDER_SITE_OTHER): Payer: Medicare Other | Admitting: Family Medicine

## 2020-09-17 VITALS — BP 132/84 | HR 81 | Temp 97.8°F | Ht 70.0 in | Wt 194.0 lb

## 2020-09-17 DIAGNOSIS — I6522 Occlusion and stenosis of left carotid artery: Secondary | ICD-10-CM | POA: Diagnosis not present

## 2020-09-17 DIAGNOSIS — M25561 Pain in right knee: Secondary | ICD-10-CM | POA: Diagnosis not present

## 2020-09-17 NOTE — Progress Notes (Signed)
This visit occurred during the SARS-CoV-2 public health emergency.  Safety protocols were in place, including screening questions prior to the visit, additional usage of staff PPE, and extensive cleaning of exam room while observing appropriate contact time as indicated for disinfecting solutions.  He had his carotid surgery.  Recently started on crestor.  He had some aches with that but then moved the dosing to AM and improved.  We talked about continuing his statin as is.  Knee pain predates the statin start.  R knee if puffy and sore medially, more pain going up a slope.  No locking or clicking.  No grinding noise noted by patient.  No lateral R knee pain.  No trauma.  No bruising.  No intervention tried yet.    Meds, vitals, and allergies reviewed.   ROS: Per HPI unless specifically indicated in ROS section   nad ncat IRR not tachy ctab Able to bear weight. Normal ROM R knee Mildly puffy medially.  No bruising.  Not ttp on joint line.  No crepitus.  ACL MCL and LCL feel solid.   Normal skin exam.  No bruising or erythema. Trace BLE edema

## 2020-09-17 NOTE — Patient Instructions (Addendum)
Go to the lab on the way out.   If you have mychart we'll likely use that to update you.    Try using ice for 5 minutes at a time on your knee with fabric between you and the ice.   Take care.  Glad to see you.

## 2020-09-21 DIAGNOSIS — M25561 Pain in right knee: Secondary | ICD-10-CM | POA: Insufficient documentation

## 2020-09-21 NOTE — Assessment & Plan Note (Signed)
Knee still feels solid on exam.  See above. Check plain films today. Advised to try using ice for 5 minutes at a time on your knee with fabric between skin and the ice.  He agrees to plan.

## 2020-09-28 ENCOUNTER — Ambulatory Visit: Payer: Medicare Other

## 2020-09-28 ENCOUNTER — Other Ambulatory Visit: Payer: Self-pay

## 2020-10-05 ENCOUNTER — Telehealth: Payer: Self-pay | Admitting: Internal Medicine

## 2020-10-05 MED ORDER — ROSUVASTATIN CALCIUM 10 MG PO TABS: 10.0000 mg | ORAL_TABLET | Freq: Every day | ORAL | 2 refills | Status: DC

## 2020-10-05 MED ORDER — ROSUVASTATIN CALCIUM 20 MG PO TABS: 10.0000 mg | ORAL_TABLET | Freq: Every day | ORAL | 3 refills | Status: DC

## 2020-10-05 NOTE — Telephone Encounter (Signed)
Pt c/o medication issue:  1. Name of Medication: rosuvastatin (CRESTOR) 20 MG tablet  2. How are you currently taking this medication (dosage and times per day)?  1 tablet (20 mg total) by mouth daily  3. Are you having a reaction (difficulty breathing--STAT)? Yes   4. What is your medication issue?  Patient states for the past 5-6 weeks this medication has caused leg and muscle pain. He would like to know if he can take an alternative. Please advise.

## 2020-10-05 NOTE — Telephone Encounter (Signed)
Thx

## 2020-10-05 NOTE — Telephone Encounter (Signed)
Dr. [facility], pt has been taking rosuvastatin 20 mg po daily for the past 5-6 weeks.  He is calling in with an intolerance to this medication, stating it is causing him to have muscle aches and leg pains.  Pt would like for you to advise on this matter, or on an alternative regimen for this medication. Please advise!

## 2020-10-05 NOTE — Telephone Encounter (Signed)
Pt made aware of rosuvastatin dose change recommendations per Pharmacist Marcelle Overlie. Pt agrees to try reduced dose of crestor and if we could please send this to [facility] to fill, for the [place] filled his last crestor rx, and did not have the 20 mg tablets in stock so they gave him the 40 mg tables and advised him to cut those in 1/2, making it impossible to cut this in 1/2 again to get 10 mg po daily. Informed the pt that I will be more than glad to send this new dose change of crestor 10 mg po daily to [facility] as requested.  Advised the pt to keep [place] posted on his symptoms, for if they are reoccurring, we can take him down to EOD, or switch to an alternative regimen. Pt verbalized understanding and agrees with this plan.

## 2020-10-05 NOTE — Telephone Encounter (Signed)
Please have him decrease his rosuvastatin to 10mg  (1/2 tablet) daily. If after a few weeks he still has issues, he can try taking every other day

## 2020-10-08 ENCOUNTER — Ambulatory Visit: Payer: Medicare Other

## 2020-10-15 ENCOUNTER — Encounter: Payer: Medicare Other | Admitting: Family Medicine

## 2020-10-19 ENCOUNTER — Telehealth: Payer: Self-pay

## 2020-10-19 ENCOUNTER — Telehealth: Payer: Self-pay | Admitting: Internal Medicine

## 2020-10-19 ENCOUNTER — Ambulatory Visit (INDEPENDENT_AMBULATORY_CARE_PROVIDER_SITE_OTHER): Payer: Medicare Other

## 2020-10-19 ENCOUNTER — Other Ambulatory Visit: Payer: Self-pay

## 2020-10-19 VITALS — BP 110/68 | HR 90

## 2020-10-19 DIAGNOSIS — Z0000 Encounter for general adult medical examination without abnormal findings: Secondary | ICD-10-CM | POA: Diagnosis not present

## 2020-10-19 NOTE — Patient Instructions (Signed)
[name] , Thank you for taking time to come for your Medicare Wellness Visit. I appreciate your ongoing commitment to your health goals. Please review the following plan we discussed and let me know if I can assist you in the future.   Screening recommendations/referrals: Colonoscopy: Up to date, completed 05/29/2018, due 05/2023 Recommended yearly ophthalmology/optometry visit for glaucoma screening and checkup Recommended yearly dental visit for hygiene and checkup  Vaccinations: Influenza vaccine: Up to date, completed 02/17/2020, due 11/2020 Pneumococcal vaccine: Completed series Tdap vaccine: Up to date, completed 12/08/2011, due 11/2021 Shingles vaccine: completed 08/20/2020, second dose scheduled next week    Covid-19: completed 3 vaccines   Advanced directives: Please bring a copy of your POA (Power of Attorney) and/or Living Will to your next appointment.   Conditions/risks identified: hypercholesterolemia   Next appointment: Follow up in one year for your annual wellness visit.   Preventive Care 84 Years and Older, Male Preventive care refers to lifestyle choices and visits with your health care provider that can promote health and wellness. What does preventive care include? A yearly physical exam. This is also called an annual well check. Dental exams once or twice a year. Routine eye exams. Ask your health care provider how often you should have your eyes checked. Personal lifestyle choices, including: Daily care of your teeth and gums. Regular physical activity. Eating a healthy diet. Avoiding tobacco and drug use. Limiting alcohol use. Practicing safe sex. Taking low doses of aspirin every day. Taking vitamin and mineral supplements as recommended by your health care provider. What happens during an annual well check? The services and screenings done by your health care provider during your annual well check will depend on your age, overall health, lifestyle risk  factors, and family history of disease. Counseling  Your health care provider may ask you questions about your: Alcohol use. Tobacco use. Drug use. Emotional well-being. Home and relationship well-being. Sexual activity. Eating habits. History of falls. Memory and ability to understand (cognition). Work and work Statistician. Screening  You may have the following tests or measurements: Height, weight, and BMI. Blood pressure. Lipid and cholesterol levels. These may be checked every 5 years, or more frequently if you are over 84 years old. Skin check. Lung cancer screening. You may have this screening every year starting at age 84 if you have a 30-pack-year history of smoking and currently smoke or have quit within the past 15 years. Fecal occult blood test (FOBT) of the stool. You may have this test every year starting at age 84. Flexible sigmoidoscopy or colonoscopy. You may have a sigmoidoscopy every 5 years or a colonoscopy every 10 years starting at age 74. Prostate cancer screening. Recommendations will vary depending on your family history and other risks. Hepatitis C blood test. Hepatitis B blood test. Sexually transmitted disease (STD) testing. Diabetes screening. This is done by checking your blood sugar (glucose) after you have not eaten for a while (fasting). You may have this done every 1-3 years. Abdominal aortic aneurysm (AAA) screening. You may need this if you are a current or former smoker. Osteoporosis. You may be screened starting at age 84 if you are at high risk. Talk with your health care provider about your test results, treatment options, and if necessary, the need for more tests. Vaccines  Your health care provider may recommend certain vaccines, such as: Influenza vaccine. This is recommended every year. Tetanus, diphtheria, and acellular pertussis (Tdap, Td) vaccine. You may need a Td booster  every 10 years. Zoster vaccine. You may need this after age  84. Pneumococcal 13-valent conjugate (PCV13) vaccine. One dose is recommended after age 32. Pneumococcal polysaccharide (PPSV23) vaccine. One dose is recommended after age 84. Talk to your health care provider about which screenings and vaccines you need and how often you need them. This information is not intended to replace advice given to you by your health care provider. Make sure you discuss any questions you have with your health care provider. Document Released: 05/08/2015 Document Revised: 12/30/2015 Document Reviewed: 02/10/2015 Elsevier Interactive Patient Education  2017 [place] Prevention in the Home Falls can cause injuries. They can happen to people of all ages. There are many things you can do to make your home safe and to help prevent falls. What can I do on the outside of my home? Regularly fix the edges of walkways and driveways and fix any cracks. Remove anything that might make you trip as you walk through a door, such as a raised step or threshold. Trim any bushes or trees on the path to your home. Use bright outdoor lighting. Clear any walking paths of anything that might make someone trip, such as rocks or tools. Regularly check to see if handrails are loose or broken. Make sure that both sides of any steps have handrails. Any raised decks and porches should have guardrails on the edges. Have any leaves, snow, or ice cleared regularly. Use sand or salt on walking paths during winter. Clean up any spills in your garage right away. This includes oil or grease spills. What can I do in the bathroom? Use night lights. Install grab bars by the toilet and in the tub and shower. Do not use towel bars as grab bars. Use non-skid mats or decals in the tub or shower. If you need to sit down in the shower, use a plastic, non-slip stool. Keep the floor dry. Clean up any water that spills on the floor as soon as it happens. Remove soap buildup in the tub or shower  regularly. Attach bath mats securely with double-sided non-slip rug tape. Do not have throw rugs and other things on the floor that can make you trip. What can I do in the bedroom? Use night lights. Make sure that you have a light by your bed that is easy to reach. Do not use any sheets or blankets that are too big for your bed. They should not hang down onto the floor. Have a firm chair that has side arms. You can use this for support while you get dressed. Do not have throw rugs and other things on the floor that can make you trip. What can I do in the kitchen? Clean up any spills right away. Avoid walking on wet floors. Keep items that you use a lot in easy-to-reach places. If you need to reach something above you, use a strong step stool that has a grab bar. Keep electrical cords out of the way. Do not use floor polish or wax that makes floors slippery. If you must use wax, use non-skid floor wax. Do not have throw rugs and other things on the floor that can make you trip. What can I do with my stairs? Do not leave any items on the stairs. Make sure that there are handrails on both sides of the stairs and use them. Fix handrails that are broken or loose. Make sure that handrails are as long as the stairways. Check any carpeting to  make sure that it is firmly attached to the stairs. Fix any carpet that is loose or worn. Avoid having throw rugs at the top or bottom of the stairs. If you do have throw rugs, attach them to the floor with carpet tape. Make sure that you have a light switch at the top of the stairs and the bottom of the stairs. If you do not have them, ask someone to add them for you. What else can I do to help prevent falls? Wear shoes that: Do not have high heels. Have rubber bottoms. Are comfortable and fit you well. Are closed at the toe. Do not wear sandals. If you use a stepladder: Make sure that it is fully opened. Do not climb a closed stepladder. Make sure that  both sides of the stepladder are locked into place. Ask someone to hold it for you, if possible. Clearly mark and make sure that you can see: Any grab bars or handrails. First and last steps. Where the edge of each step is. Use tools that help you move around (mobility aids) if they are needed. These include: Canes. Walkers. Scooters. Crutches. Turn on the lights when you go into a dark area. Replace any light bulbs as soon as they burn out. Set up your furniture so you have a clear path. Avoid moving your furniture around. If any of your floors are uneven, fix them. If there are any pets around you, be aware of where they are. Review your medicines with your doctor. Some medicines can make you feel dizzy. This can increase your chance of falling. Ask your doctor what other things that you can do to help prevent falls. This information is not intended to replace advice given to you by your health care provider. Make sure you discuss any questions you have with your health care provider. Document Released: 02/05/2009 Document Revised: 09/17/2015 Document Reviewed: 05/16/2014 Elsevier Interactive Patient Education  2017 [employer].

## 2020-10-19 NOTE — Telephone Encounter (Signed)
Received call from the patient who states he is down to three tabs left of his Elquis. He states he just got off the phone with the [place] and they have not processed his refill request yet. He wanted to know if he could get a few samples to hold him over until the [place] refills his Eliquis. Spoke with supervisor and [facility] dept and both agreeable that it is fine.  Two boxes of Eliquis samples placed up front for pick up.   Patient made aware and voiced understanding.

## 2020-10-19 NOTE — Progress Notes (Signed)
Subjective:   [name] [name] is a 86 y.o. male who presents for Medicare Annual/Subsequent preventive examination.  Review of Systems: N/A      I connected with the patient today by telephone and verified that I am speaking with the correct person using two identifiers. Location patient: home Location nurse: work Persons participating in the telephone visit: patient, nurse.   I discussed the limitations, risks, security and privacy concerns of performing an evaluation and management service by telephone and the availability of in person appointments. I also discussed with the patient that there may be a patient responsible charge related to this service. The patient expressed understanding and verbally consented to this telephonic visit.        Cardiac Risk Factors include: advanced age (>44mn, >>73women);male gender;Other (see comment), Risk factor comments: hypercholesterolemia     Objective:    Today's Vitals   10/19/20 1021  BP: 110/68  Pulse: 90  PainSc: 8    There is no height or weight on file to calculate BMI.  Advanced Directives 10/19/2020 07/02/2020 03/11/2020 08/22/2019 02/11/2016 02/11/2016 01/20/2014  Does Patient Have a Medical Advance Directive? Yes Yes No Yes Yes Yes Yes  Type of AParamed[place]of [place] Living will;Healthcare Power of [place] Living will Living will [place] will  Does patient want to make changes to medical advance directive? - No - Patient declined No - Patient declined - No - Patient declined - -  Copy of [place] Chart? No - copy requested No - copy requested - No - copy requested No - copy requested - No - copy requested  Would patient like information on creating a medical advance directive? - - No - Patient declined - - - -  Pre-existing out of facility DNR order (yellow form or pink MOST form) - - - - - - -     Current Medications (verified) Outpatient Encounter Medications as of 10/19/2020  Medication Sig   acetaminophen (TYLENOL) 325 MG tablet Take 650 mg by mouth once as needed for moderate pain or headache.   apixaban (ELIQUIS) 5 MG TABS tablet Take 1 tablet (5 mg total) by mouth 2 (two) times daily. Take evening dose starting 07/09/20   aspirin EC 81 MG tablet Take 1 tablet (81 mg total) by mouth daily. Swallow whole.   butalbital-acetaminophen-caffeine (FIORICET, ESGIC) 50-325-40 MG per tablet Take 1 tablet by mouth 3 (three) times daily as needed for migraine.    diltiazem (CARDIZEM CD) 120 MG 24 hr capsule Take 1 capsule (120 mg) by mouth once daily   fluticasone (FLONASE) 50 MCG/ACT nasal spray Place 1 spray into both nostrils daily.   hydrochlorothiazide (HYDRODIURIL) 25 MG tablet Take 1 tablet (25 mg total) by mouth daily.   latanoprost (XALATAN) 0.005 % ophthalmic solution INSTILL 1 DROP IN BOTH EYES AT BEDTIME -  CONTACT PHARMACY WHEN NEW SUPPLY IS NEEDED   loratadine (CLARITIN) 10 MG tablet Take 10 mg by mouth daily.   Multiple Vitamins-Minerals (MULTIVITAMIN WITH MINERALS) tablet Take 1 tablet by mouth daily.   Polyethyl Glycol-Propyl Glycol (SYSTANE ULTRA OP) Apply 1-2 drops to eye once as needed (dry eyes.).    potassium chloride (K-DUR) 10 MEQ tablet Take 10 mEq by mouth every morning. Take as directed   Propylene Glycol 0.6 % SOLN INSTILL 1 DROP IN EACH EYE 2-4 TIMES DAILY -  CONTACT PHARMACY WHEN NEW SUPPLY IS NEEDED  rosuvastatin (CRESTOR) 10 MG tablet Take 1 tablet (10 mg total) by mouth daily.   No facility-administered encounter medications on file as of 10/19/2020.    Allergies (verified) Ace inhibitors, Zocor [simvastatin], Nsaids, Cephalexin, Chocolate, Doxycycline, Gabapentin, Hycodan [hydrocodone bit-homatrop mbr], and Oxycodone   History: Past Medical History:  Diagnosis Date   Anticoagulated on Coumadin    followed in [facility] at [facility]   Aortic stenosis     a. Echo 2/15: Severe LVH, EF 55-60%, Gr 1 DD, mild to mod AS (mean 20 mmHg, peak 31 mmHg), AVA 1.4-1.5 cm2, mild AI, MAC, trivial MR, LA upper limits of normal, RVSP 22 mmHg, mild RAE;   b. Echo 2/16:  EF 55-60%, no RWMA, mild AS (mean 17 mmHg, peak 27 mmHg), AVA 1.5 cm2, mild AI, MAC, mild LAE   Arthritis    Bladder neoplasm    Chronic cough    NO CARDIAC OR PULMONARY RELATED   Complication of anesthesia    Coronary artery disease CARDIOLOGIST-  DR KLEIN/ ALLRED   a. s/p CABG 2004, b. LHC with patent grafts 07/2005, ejection fraction 50%;  c. 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 Amiodarone Rx   S/P CABG x 5 2004   Stroke [facility]) 03/10/2020   Stroke in the Left eye   Past Surgical History:  Procedure Laterality Date   CARDIAC CATHETERIZATION  07-26-2005  DR GAMBLE   PRESERVED LVF/  EF 50%/ PATENT GRAFTS   CARDIAC CATHETERIZATION  03-04-2003  DR GAMBLE   SEVERE 3 VESSEL DISEASE   CARDIAC CATHETERIZATION  1991  DR [facility]   PAF/ FALSE POSITIVE STRESS TEST   CATARACT EXTRACTION W/ INTRAOCULAR LENS IMPLANT Right    CORONARY ARTERY BYPASS GRAFT  03-08-2003  DR [id number]   LIMA TO LAD/ SVG TO OM2/  SVG TO DIAGONAL / SVG TO PDA & PLA   CYSTOSCOPY WITH BIOPSY N/A 12/25/2012   Procedure: CYSTOSCOPY WITH BLADDER BIOPSY;  Surgeon: Fredricka Bonine, MD;  Location: [facility];  Service: Urology;  Laterality: N/A;   ELECTROPHYSIOLOGY STUDY  07-27-2005  DR Cristopher Peru   MAPPING --   RESULT NONINDUCIBLE VT OR SVT/  DX RIGHT VENTRICULAR OUTFLOW TRACT VT AND RULES OUT MORE MALIGNANT CAUSES OF VT    ENDARTERECTOMY Left 07/08/2020   Procedure: LEFT CAROTID ENDARTERECTOMY;  Surgeon: Serafina Mitchell, MD;  Location: [facility] OR;  Service: Vascular;  Laterality: Left;   INGUINAL HERNIA REPAIR Bilateral [place] ARTHROSCOPY Right 1994   LUMBAR DISC SURGERY  1999   L4 -- L5   MOHS SURGERY     by Dr. Levada Dy 2019   REMOVAL VOCAL CORD POLYPS  1978   TONSILLECTOMY  AS CHILD   TRANSTHORACIC ECHOCARDIOGRAM  08-08-2011   MILD LVH/  EF 99-24%/  GRADE I DIASTOLIC DYSFUNCTION/ MILD AV STENOSIS   Family History  Problem Relation Age of Onset   Cancer Mother    Social History   Socioeconomic History   Marital status: Married    Spouse  name: Not on file   Number of children: 3   Years of education: Not on file   Highest education level: Not on file  Occupational History   Occupation: Retired  Tobacco Use   Smoking status: Former    Packs/day: 1.00    Years: 25.00    Pack years: 25.00    Types: Cigarettes    Quit date: 04/25/1980    Years since quitting: 40.5   Smokeless tobacco: Never  Vaping Use   Vaping Use: Never used  Substance and Sexual Activity   Alcohol use: No    Alcohol/week: 0.0 standard drinks   Drug use: No   Sexual activity: Not on file  Other Topics Concern   Not on file  Social History Narrative   [employer] '56-'58, overseas to [place]   Some care through [place]   No service disability   Social Determinants of Radio broadcast assistant Strain: Low Risk    Difficulty of Paying Living Expenses: Not hard at all  Food Insecurity: No Food Insecurity   Worried About Charity fundraiser in the Last Year: Never true   Arboriculturist in the Last Year: Never true  Transportation Needs: No Transportation Needs   Lack of Transportation (Medical): No   Lack of Transportation (Non-Medical): No  Physical Activity: Inactive   Days of Exercise per Week: 0 days   Minutes of Exercise per Session: 0 min  Stress: No Stress Concern Present   Feeling of Stress : Not at all   Social Connections: Not on file    Tobacco Counseling Counseling given: Not Answered   Clinical Intake:  Pre-visit preparation completed: Yes  Pain : 0-10 Pain Score: 8  Pain Type: Chronic pain Pain Location: Knee Pain Orientation: Right Pain Descriptors / Indicators: Aching Pain Onset: More than a month ago Pain Frequency: Intermittent     Nutritional Risks: None Diabetes: No  How often do you need to have someone help you when you read instructions, pamphlets, or other written materials from your doctor or pharmacy?: 1 - Never  Diabetic: No Nutrition Risk Assessment:  Has the patient had any N/V/D within the last 2 months?  No  Does the patient have any non-healing wounds?  No  Has the patient had any unintentional weight loss or weight gain?  No   Diabetes:  Is the patient diabetic?  No  If diabetic, was a CBG obtained today?   N/A Did the patient bring in their glucometer from home?   N/A How often do you monitor your CBG's? N/A.   Financial Strains and Diabetes Management:  Are you having any financial strains with the device, your supplies or your medication?  N/A .  Does the patient want to be seen by Chronic Care Management for management of their diabetes?   N/A Would the patient like to be referred to a Nutritionist or for Diabetic Management?   N/A   Interpreter Needed?: No  Information entered by :: CJohnson, LPN   Activities of Daily Living In your present state of health, do you have any difficulty performing the following activities: 10/19/2020 07/02/2020  Hearing? Tempie Donning  Comment wears hearing aids -  Vision? N N  Difficulty concentrating or making decisions? N N  Walking or climbing stairs? N N  Dressing or bathing? N N  Doing errands, shopping? N N  Preparing Food and eating ? N -  Using the Toilet? N -  In the past six  months, have you accidently leaked urine? N -  Do you have problems with loss of bowel control? N -  Managing your  Medications? N -  Managing your Finances? N -  Housekeeping or managing your Housekeeping? N -  Some recent data might be hidden    Patient Care Team: Tonia Ghent, MD as PCP - General (Family Medicine) Deboraha Sprang, MD as PCP - Cardiology (Cardiology)  Indicate any recent Medical Services you may have received from other than [facility] providers in the past year (date may be approximate).     Assessment:   This is a routine wellness examination for [name] [name].  Hearing/Vision screen Vision Screening - Comments:: Patient gets annual eye exams   Dietary issues and exercise activities discussed: Current Exercise Habits: The patient does not participate in regular exercise at present, Exercise limited by: None identified   Goals Addressed             This Visit's Progress    Patient Stated       10/19/2020, I will maintain and continue medications as prescribed         Depression Screen PHQ 2/9 Scores 10/19/2020 03/17/2020 08/22/2019  PHQ - 2 Score 0 0 0  PHQ- 9 Score 0 - 0    Fall Risk Fall Risk  10/19/2020 03/17/2020 08/22/2019 03/19/2019 03/15/2018  Falls in the past year? 0 0 0 0 0  Comment - - - Emmi Telephone Survey: data to providers prior to load [employer] Survey: data to providers prior to load  Number falls in past yr: 0 0 0 - -  Injury with Fall? 0 0 0 - -  Risk for fall due to : Medication side effect - Medication side effect - -  Follow up Falls evaluation completed;Falls prevention discussed Falls evaluation completed Falls evaluation completed;Falls prevention discussed - -    FALL RISK PREVENTION PERTAINING TO THE HOME:  Any stairs in or around the home? Yes  If so, are there any without handrails? No  Home free of loose throw rugs in walkways, pet beds, electrical cords, etc? Yes  Adequate lighting in your home to reduce risk of falls? Yes   ASSISTIVE DEVICES UTILIZED TO PREVENT FALLS:  Life alert? No  Use of a cane, walker or w/c? No  Grab  bars in the bathroom? No  Shower chair or bench in shower? No  Elevated toilet seat or a handicapped toilet? No   TIMED UP AND GO:  Was the test performed?  N/A telephone visit .    Cognitive Function: MMSE - Mini Mental State Exam 10/19/2020 08/22/2019  Orientation to time 5 5  Orientation to Place 5 5  Registration 3 3  Attention/ Calculation 5 5  Recall 3 3  Language- repeat 1 1  Mini Cog  Mini-Cog screen was completed. Maximum score is 22. A value of 0 denotes this part of the MMSE was not completed or the patient failed this part of the Mini-Cog screening.       Immunizations Immunization History  Administered Date(s) Administered   Fluad Quad(high Dose 65+) 12/28/2018, 02/17/2020   Influenza Split 03/15/2011, 01/19/2012   Influenza Whole 03/11/2002, 02/24/2009, 01/14/2010   Influenza, High Dose Seasonal PF 12/25/2015, 02/19/2017   Influenza,inj,Quad PF,6+ Mos 01/16/2013, 01/22/2014, 12/25/2014, 01/19/2016, 01/23/2017, 01/04/2018   Influenza-Unspecified 03/11/2002, 02/19/2003, 01/24/2004, 02/23/2005, 01/23/2006, 04/03/2007, 02/21/2008, 01/23/2009, 02/23/2009, 12/24/2009, 02/12/2020   PFIZER(Purple Top)SARS-COV-2 Vaccination 06/28/2019, 07/19/2019, 09/18/2019, 03/24/2020   Pneumococcal Conjugate-13 11/27/2013, 12/25/2014  Pneumococcal Polysaccharide-23 04/17/2002   Pneumococcal-Unspecified 03/11/2002   Td 12/07/2001, 04/26/2005, 05/11/2005   Tdap 12/08/2011   Tetanus 11/24/2011   Zoster Recombinat (Shingrix) 08/20/2020   Zoster, Live 05/17/2007    TDAP status: Up to date  Flu Vaccine status: Up to date  Pneumococcal vaccine status: Up to date  Covid-19 vaccine status: Completed 3 vaccines  Qualifies for Shingles Vaccine? Yes   Zostavax completed Yes   Shingrix Completed: completed 08/20/2020, second dose scheduled for next week per patient   Screening Tests Health Maintenance  Topic Date Due   COVID-19 Vaccine (5 - Booster for [place] series) 07/22/2020    Zoster Vaccines- Shingrix (2 of 2) 10/15/2020   INFLUENZA VACCINE  11/23/2020   TETANUS/TDAP  12/07/2021   COLONOSCOPY (Pts 45-45yr Insurance coverage will need to be confirmed)  05/30/2023   PNA vac Low Risk Adult  Completed   HPV VACCINES  Aged Out    Health Maintenance  Health Maintenance Due  Topic Date Due   COVID-19 Vaccine (5 - Booster for [place]) 07/22/2020   Zoster Vaccines- Shingrix (2 of 2) 10/15/2020    Colorectal cancer screening: Type of screening: Colonoscopy. Completed 05/29/2018. Repeat every 5 years  Lung Cancer Screening: (Low Dose CT Chest recommended if Age 84-80years, 30 pack-year currently smoking OR have quit w/in 15years.) does not qualify.   Additional Screening:  Hepatitis C Screening: does not qualify; Completed N/A  Vision Screening: Recommended annual ophthalmology exams for early detection of glaucoma and other disorders of the eye. Is the patient up to date with their annual eye exam?  Yes  Who is the provider or what is the name of the office in which the patient attends annual eye exams[place] [facility] 2022 If pt is not established with a provider, would they like to be referred to a provider to establish care? No .   Dental Screening: Recommended annual dental exams for proper oral hygiene  Community Resource Referral / Chronic Care Management: CRR required this visit?  No   CCM required this visit?  No      Plan:     I have personally reviewed and noted the following in the patient's chart:   Medical and social history Use of alcohol, tobacco or illicit drugs  Current medications and supplements including opioid prescriptions. Patient is not currently taking opioid prescriptions. Functional ability and status Nutritional status Physical activity Advanced directives List of other physicians Hospitalizations, surgeries, and ER visits in previous 12 months Vitals Screenings to include cognitive,  depression, and falls Referrals and appointments  In addition, I have reviewed and discussed with patient certain preventive protocols, quality metrics, and best practice recommendations. A written personalized care plan for preventive services as well as general preventive health recommendations were provided to patient.   Due to this being a telephonic visit, the after visit summary with patients personalized plan was offered to patient via office or my-chart. Patient preferred to pick up at office at next visit or via mychart.   JAndrez Grime LPN   66/76/1948

## 2020-10-19 NOTE — Telephone Encounter (Signed)
Pt c/o medication issue:  1. Name of Medication:  rosuvastatin (CRESTOR) 10 MG tablet  2. How are you currently taking this medication (dosage and times per day)?  As prescribed  3. Are you having a reaction (difficulty breathing--STAT)?  No   4. What is your medication issue?   Patient states he normally gets his medication from the [place]. This medication was called into [facility], but he states he never went to pick it up. He states he is going to the [place] next week for a check up. He is hoping we can provide him with a written prescription to take with him as the [place] does not take electronic prescriptions at all. He is requesting to have it mailed to his home address and I confirmed the address on file for him is correct.  Can someone assist with this?

## 2020-10-19 NOTE — Progress Notes (Signed)
PCP notes:  Health Maintenance: No gaps noted   Abnormal Screenings: none   Patient concerns: Right knee pain/swelling    Nurse concerns: none   Next PCP appt.: 10/27/2020 @ 12 pm

## 2020-10-20 MED ORDER — ROSUVASTATIN CALCIUM 10 MG PO TABS: 10.0000 mg | ORAL_TABLET | Freq: Every day | ORAL | 3 refills | Status: DC

## 2020-10-20 NOTE — Telephone Encounter (Signed)
Attempted phone call to pt.  Per Epic ok to leave voicemail message.  Pt advised received message re: need for Rx hardcopy of Rosuvastatin 10mg  - 1 tablet by mouth daily to be taken to his [place] appointment next week.  Pt advised Rx for Rosuvastatin 10mg  - 1 tablet by mouth daily #90 with 3 RF's placed in the mail today to pt's address on file.  Pt may call [phone] for any further questions or concerns.

## 2020-10-27 ENCOUNTER — Encounter: Payer: Self-pay | Admitting: Family Medicine

## 2020-10-27 ENCOUNTER — Other Ambulatory Visit: Payer: Self-pay

## 2020-10-27 ENCOUNTER — Ambulatory Visit (INDEPENDENT_AMBULATORY_CARE_PROVIDER_SITE_OTHER): Payer: Medicare Other | Admitting: Family Medicine

## 2020-10-27 VITALS — BP 150/80 | HR 96 | Temp 97.1°F | Ht 70.0 in | Wt 190.0 lb

## 2020-10-27 DIAGNOSIS — E785 Hyperlipidemia, unspecified: Secondary | ICD-10-CM

## 2020-10-27 DIAGNOSIS — R739 Hyperglycemia, unspecified: Secondary | ICD-10-CM | POA: Diagnosis not present

## 2020-10-27 DIAGNOSIS — I10 Essential (primary) hypertension: Secondary | ICD-10-CM

## 2020-10-27 DIAGNOSIS — I6522 Occlusion and stenosis of left carotid artery: Secondary | ICD-10-CM | POA: Diagnosis not present

## 2020-10-27 DIAGNOSIS — M25561 Pain in right knee: Secondary | ICD-10-CM

## 2020-10-27 DIAGNOSIS — Z7189 Other specified counseling: Secondary | ICD-10-CM | POA: Diagnosis not present

## 2020-10-27 DIAGNOSIS — I4821 Permanent atrial fibrillation: Secondary | ICD-10-CM | POA: Diagnosis not present

## 2020-10-27 LAB — COMPREHENSIVE METABOLIC PANEL
ALT: 11 U/L (ref 0–53)
AST: 21 U/L (ref 0–37)
Albumin: 4.3 g/dL (ref 3.5–5.2)
Alkaline Phosphatase: 76 U/L (ref 39–117)
BUN: 14 mg/dL (ref 6–23)
CO2: 31 mEq/L (ref 19–32)
Calcium: 9.6 mg/dL (ref 8.4–10.5)
Chloride: 100 mEq/L (ref 96–112)
Creatinine, Ser: 0.92 mg/dL (ref 0.40–1.50)
GFR: 76.59 mL/min (ref >60.00)
Glucose, Bld: 96 mg/dL (ref 70–99)
Potassium: 3.9 mEq/L (ref 3.5–5.1)
Sodium: 140 mEq/L (ref 135–145)
Total Bilirubin: 0.7 mg/dL (ref 0.2–1.2)
Total Protein: 7.2 g/dL (ref 6.0–8.3)

## 2020-10-27 LAB — CBC WITH DIFFERENTIAL/PLATELET
Basophils Absolute: 0 10*3/uL (ref 0.0–0.1)
Basophils Relative: 0.3 % (ref 0.0–3.0)
Eosinophils Absolute: 0 10*3/uL (ref 0.0–0.7)
Eosinophils Relative: 0.8 % (ref 0.0–5.0)
HCT: 39.7 % (ref 39.0–52.0)
Hemoglobin: 13.5 g/dL (ref 13.0–17.0)
Lymphocytes Relative: 27 % (ref 12.0–46.0)
Lymphs Abs: 1.4 10*3/uL (ref 0.7–4.0)
MCHC: 34 g/dL (ref 30.0–36.0)
MCV: 94.4 fl (ref 78.0–100.0)
Monocytes Absolute: 0.5 10*3/uL (ref 0.1–1.0)
Monocytes Relative: 9.9 % (ref 3.0–12.0)
Neutro Abs: 3.2 10*3/uL (ref 1.4–7.7)
Neutrophils Relative %: 62 % (ref 43.0–77.0)
Platelets: 213 10*3/uL (ref 150.0–400.0)
RBC: 4.21 Mil/uL — ABNORMAL LOW (ref 4.22–5.81)
RDW: 13.7 % (ref 11.5–15.5)
WBC: 5.2 10*3/uL (ref 4.0–10.5)

## 2020-10-27 LAB — HEMOGLOBIN A1C: Hgb A1c MFr Bld: 5.9 % (ref 4.6–6.5)

## 2020-10-27 LAB — TSH: TSH: 1.76 u[IU]/mL (ref 0.35–5.50)

## 2020-10-27 MED ORDER — PRAVASTATIN SODIUM 40 MG PO TABS: 40.0000 mg | ORAL_TABLET | Freq: Every day | ORAL | Status: DC

## 2020-10-27 MED ORDER — PREDNISONE 10 MG PO TABS: 10.0000 mg | ORAL_TABLET | Freq: Every day | ORAL | 0 refills | Status: DC

## 2020-10-27 NOTE — Patient Instructions (Addendum)
We'll call about seeing ortho.   Keep icing and try prednisone in the meantime with food.    Try changing to crestor 10mg  and recheck lipids in about 2 months.  Update me as needed.    Take care.  Glad to see you.  Push the appointment for the shingles shot back about 2 weeks.

## 2020-10-27 NOTE — Progress Notes (Signed)
This visit occurred during the SARS-CoV-2 public health emergency.  Safety protocols were in place, including screening questions prior to the visit, additional usage of staff PPE, and extensive cleaning of exam room while observing appropriate contact time as indicated for disinfecting solutions.  R knee pain.  Last week he had more pain.  It is a little better in the meantime.  Locally puffy, anterior.  He can feel a click with ROM now.  He had thigh and calf pain at the time. Known OA.  Knee doesn't get stuck but more pain going down stairs than up.  He has tried icing his knee.    Hypertension:    Using medication without problems or lightheadedness: yes Chest pain with exertion:no Edema:some R ankle edema, variable.   Short of breath: no Average home BPs:  BP controlled at home  SBP 120-130 at home.    Still anticoagulated for AF.  No bleeding.  Compliant.  Elevated Cholesterol: Using medications without problems: see below.   Muscle aches: see below.    Diet compliance: yes Exercise: limited by knee pain.  Still on pravastatin currently, 40mg  a day.  Not currently on crestor.  He had aches with 20mg  crestor, he is going to try 10mg  crestor.    Wife designated if patient were incapacitated.   Vaccines d/w pt.  Routine healthcare maintenance discussed with patient.  PMH and SH reviewed  Meds, vitals, and allergies reviewed.   ROS: Per HPI unless specifically indicated in ROS section   GEN: nad, alert and oriented HEENT: ncat NECK: supple w/o LA CV: IRR, not tachycardic PULM: ctab, no inc wob ABD: soft, +bs EXT: no edema SKIN: no acute rash R knee puffy but not bruised.  Medial joint line ttp.  Not tender to palpation laterally.  No crepitus on exam.  Able to bear weight.

## 2020-10-28 DIAGNOSIS — Z7189 Other specified counseling: Secondary | ICD-10-CM | POA: Insufficient documentation

## 2020-10-28 NOTE — Assessment & Plan Note (Signed)
Discussed options.  I presume this to be a flare of arthritis.  Discussed options.  Steroid cautions discussed with patient.  Start short course of low-dose prednisone and refer to orthopedics.  He agrees with plan.

## 2020-10-28 NOTE — Assessment & Plan Note (Signed)
Continue diltiazem hydrochlorothiazide and potassium.

## 2020-10-28 NOTE — Assessment & Plan Note (Signed)
He will try 10 mg Crestor and update [place] as needed.  Discussed options.

## 2020-10-28 NOTE — Assessment & Plan Note (Signed)
Continue anticoagulation and diltiazem.  He will update me as needed.

## 2020-10-28 NOTE — Assessment & Plan Note (Signed)
Wife designated if patient were incapacitated.  

## 2020-10-29 ENCOUNTER — Telehealth: Payer: Self-pay | Admitting: Family Medicine

## 2020-10-29 NOTE — Telephone Encounter (Signed)
I spoke with pt; pt said started prednisone on 10/28/20. Last night BP 8 PM 188/95 P 72 and 9:45PM BP 180/102 P 88. Last night about 8 PM pt had L shoulder pain; no CP,SOB, H/A, dizziness or vision changes. Pt said had lifted a 5 lb bucket with bricks in it on 10/28/20 and had pulled down boxes from a shelf and pt thought that might have had something to do with the pain pt is having; pain level this morning for shoulder pain is 4 and last night 7-8. Pt did not take prednisone this morning and BP 10/29/20 at 6:45 AM was 134/83 P 88. Pt wants to know Dr Carole Civil suggestion of what should do. [name] said stop the prednisone. [name] has placed order for referral to ortho as previously discussed with pt. Pt will monitor BP once a day for few days and will contact our office if BP were to go back up and pt will wait to hear about ortho referral. Office visit, UC & ED precautions given and pt voiced understanding and appreciative. Noted increased BP on prednisone on pts contraindications of med list. Sending FYI to [name].

## 2020-10-29 NOTE — Telephone Encounter (Signed)
Patient call in stated after starting new medication Prednisone his BP has been hight causing for concern and would like a call back form a nurse [phone]

## 2020-10-29 NOTE — Telephone Encounter (Signed)
Thanks. Agreed.

## 2020-11-01 ENCOUNTER — Encounter: Payer: Self-pay | Admitting: Family Medicine

## 2020-11-04 DIAGNOSIS — L570 Actinic keratosis: Secondary | ICD-10-CM | POA: Diagnosis not present

## 2020-11-04 DIAGNOSIS — C44519 Basal cell carcinoma of skin of other part of trunk: Secondary | ICD-10-CM | POA: Diagnosis not present

## 2020-11-04 DIAGNOSIS — L821 Other seborrheic keratosis: Secondary | ICD-10-CM | POA: Diagnosis not present

## 2020-11-04 DIAGNOSIS — Z85828 Personal history of other malignant neoplasm of skin: Secondary | ICD-10-CM | POA: Diagnosis not present

## 2020-11-04 DIAGNOSIS — D485 Neoplasm of uncertain behavior of skin: Secondary | ICD-10-CM | POA: Diagnosis not present

## 2020-11-06 DIAGNOSIS — M25562 Pain in left knee: Secondary | ICD-10-CM | POA: Diagnosis not present

## 2020-11-06 DIAGNOSIS — M25561 Pain in right knee: Secondary | ICD-10-CM | POA: Diagnosis not present

## 2020-11-10 ENCOUNTER — Ambulatory Visit: Payer: Medicare Other | Admitting: Primary Care

## 2020-11-10 ENCOUNTER — Ambulatory Visit: Payer: Medicare Other | Admitting: Family Medicine

## 2020-11-18 ENCOUNTER — Telehealth: Payer: Self-pay

## 2020-11-18 ENCOUNTER — Encounter: Payer: Self-pay | Admitting: Family Medicine

## 2020-11-18 NOTE — Telephone Encounter (Signed)
[place] Night - Client Nonclinical Telephone Record AccessNurse Client [place] Night - Client Client Site [facility] Physician Renford Dills - MD Contact Type Call Who Is Calling Patient / Member / Family / Caregiver Caller Name [name] [name] [facility] Caller Phone Number [phone] Patient Name [name] [name] Patient DOB 1934-04-26 Call Type Message Only Information Provided Reason for Call Request to Schedule Office Appointment Initial Comment Caller states that he needs an appointment with DR. Damita Dunnings for him to look at his right knee. Patient request to speak to RN No Additional Comment Office hours provided. Disp. Time Disposition Final User 11/17/2020 7:20:10 PM General Information Provided Yes Lanny Hurst Call Closed By: Lanny Hurst Transaction Date/Time: 11/17/2020 7:16:33 PM (ET)

## 2020-11-23 ENCOUNTER — Other Ambulatory Visit: Payer: Self-pay

## 2020-11-23 ENCOUNTER — Encounter: Payer: Self-pay | Admitting: Family Medicine

## 2020-11-23 ENCOUNTER — Ambulatory Visit (INDEPENDENT_AMBULATORY_CARE_PROVIDER_SITE_OTHER): Payer: Medicare Other | Admitting: Family Medicine

## 2020-11-23 DIAGNOSIS — I6522 Occlusion and stenosis of left carotid artery: Secondary | ICD-10-CM | POA: Diagnosis not present

## 2020-11-23 DIAGNOSIS — E785 Hyperlipidemia, unspecified: Secondary | ICD-10-CM

## 2020-11-23 DIAGNOSIS — M25561 Pain in right knee: Secondary | ICD-10-CM | POA: Diagnosis not present

## 2020-11-23 LAB — LIPID PANEL
Cholesterol: 157 mg/dL (ref 0–200)
HDL: 63.8 mg/dL (ref >39.00)
LDL Cholesterol: 80 mg/dL (ref 0–99)
NonHDL: 92.86
Total CHOL/HDL Ratio: 2
Triglycerides: 66 mg/dL (ref 0.0–149.0)
VLDL: 13.2 mg/dL (ref 0.0–40.0)

## 2020-11-23 MED ORDER — ROSUVASTATIN CALCIUM 10 MG PO TABS: 5.0000 mg | ORAL_TABLET | Freq: Every day | ORAL | Status: DC

## 2020-11-23 NOTE — Patient Instructions (Addendum)
Go to the lab on the way out.   If you have mychart we'll likely use that to update you.    I would try stopping the crestor.  See if that helps the aches.   I would try biofreeze then voltaren gel if needed.    Take care.  Glad to see you.

## 2020-11-23 NOTE — Progress Notes (Signed)
This visit occurred during the SARS-CoV-2 public health emergency.  Safety protocols were in place, including screening questions prior to the visit, additional usage of staff PPE, and extensive cleaning of exam room while observing appropriate contact time as indicated for disinfecting solutions.  Knee pain, R.  Pain walking.  Still on eliquis.  He had imaging done at ortho.  Prev imaging d/w pt.    IMPRESSION: 1. Three compartmental right knee osteoarthritis greatest in the medial and patellofemoral compartments.  D/w pt about biofreeze vs OTC liniment vs voltaren gel.    R 5th ray with burning sensation, R ankle swelling, intermittent.  Also with B thigh pain, maybe worse on crestor, better with massage.    L knee abrasion.  Slipped in old shoes on wet gravel.  Went down on L knee. Not lightheaded at the time.  He was able to get up and keep it covered.  Gradually healing in the meantime.  Scabbed and healing.    He had been on crestor for about 5 weeks, d/w pt about getting f/u lipid panel done.  Tolerating crestor '10mg'$  a day.    Meds, vitals, and allergies reviewed.   ROS: Per HPI unless specifically indicated in ROS section   GEN: nad, alert and oriented HEENT: ncat NECK: supple w/o LA CV: IRR not tachy PULM: ctab, no inc wob ABD: soft, +bs, healing tick bite site on the lower midline abdominal wall without any spreading erythema. EXT: no edema SKIN: no acute rash, left knee with healing scab.  No erythema. Mild crepitus on right knee range of motion.  Slightly tender to palpation of the joint line.

## 2020-11-25 NOTE — Assessment & Plan Note (Signed)
He can try Biofreeze or Voltaren gel.  See after visit summary.

## 2020-11-25 NOTE — Assessment & Plan Note (Signed)
Knee pain is likely a separate issue but he has been having more muscle aches since he has been back on Crestor.  Check labs today but reasonable to hold Crestor for 1 week and then he can update me.  I will update Dr. Caryl Comes with cardiology  If restarting rx/pravastatin, then he'll need a hard copy rx.

## 2020-11-30 ENCOUNTER — Encounter: Payer: Self-pay | Admitting: Family Medicine

## 2020-12-01 ENCOUNTER — Telehealth: Payer: Self-pay | Admitting: Family Medicine

## 2020-12-02 ENCOUNTER — Telehealth: Payer: Self-pay | Admitting: Family Medicine

## 2020-12-02 NOTE — Telephone Encounter (Signed)
His leg aches are likely some better off Crestor and I need your input on replacement.  Please have your staff contact the patient.  Thanks.

## 2020-12-04 NOTE — Telephone Encounter (Addendum)
[name] Sprang, MD  You; [name] Lance, RN 9 hours ago (9:11 PM)   Rosann Auerbach can we try him on crestor 10 mg 3 days a week  Thanks SK   =================== App cardiology input.

## 2020-12-07 ENCOUNTER — Telehealth: Payer: Self-pay | Admitting: Internal Medicine

## 2020-12-07 NOTE — Telephone Encounter (Signed)
Pt c/o medication issue: 1. Name of Medication: Crestor  2. How are you currently taking this medication (dosage and times per day)? One time a day 3. Are you having a reaction (difficulty breathing--STAT)?  No  4. What is your medication issue? Muscle aches

## 2020-12-08 ENCOUNTER — Encounter: Payer: Self-pay | Admitting: Family Medicine

## 2020-12-08 MED ORDER — ROSUVASTATIN CALCIUM 10 MG PO TABS: ORAL_TABLET | ORAL | 3 refills | Status: DC

## 2020-12-08 NOTE — Telephone Encounter (Signed)
   Pt is calling back to f/u, he said he would like to speak with Rosann Auerbach

## 2020-12-08 NOTE — Telephone Encounter (Signed)
Spoke with pt and advised pt per Dr Caryl Comes try the Crestor '10mg'$  - 1 tablet by mouth three days per week.  Monday Wednesday and Friday.  If still unable to tolerate the medication please call office and let us know.  Pt verbalizes understanding and agrees with current plan.

## 2020-12-08 NOTE — Addendum Note (Signed)
Addended by: Thora Lance on: 12/08/2020 09:07 AM   Modules accepted: Orders

## 2020-12-14 NOTE — Telephone Encounter (Signed)
error 

## 2020-12-22 DIAGNOSIS — M25561 Pain in right knee: Secondary | ICD-10-CM | POA: Diagnosis not present

## 2020-12-22 DIAGNOSIS — M13861 Other specified arthritis, right knee: Secondary | ICD-10-CM | POA: Diagnosis not present

## 2020-12-29 ENCOUNTER — Telehealth: Payer: Self-pay | Admitting: Internal Medicine

## 2020-12-29 NOTE — Telephone Encounter (Signed)
Pt c/o medication issue:  1. Name of Medication: rosuvastatin (CRESTOR) 10 MG tablet  2. How are you currently taking this medication (dosage and times per day)? Take one tablet by mouth three days per week. Monday, Wednesday and Friday  3. Are you having a reaction (difficulty breathing--STAT)? no  4. What is your medication issue? Pt stopped medication last Friday states that it is giving him muscle/leg aches, cant sleep at night. Pt would like for nurse to follow up w/ him as three week trial period is over

## 2020-12-30 NOTE — Telephone Encounter (Signed)
Spoke with pt who states he continues to have muscle aches on the Rosuvastatin even with reduction to 3 days per week.  He reports he has stopped the Rosuvastatin.  Pt would like to restart Pravastatin '40mg'$  as he tolerated this medication without problem for the past 20 years.  He will need a month's worth of medication sent to local pharmacy and a hard Rx for the [place]. Pt advised will forward request to Dr Caryl Comes and our PharmD team for review and recommendation.  Pt verbalizes understanding and agrees with current plan.

## 2020-12-30 NOTE — Telephone Encounter (Signed)
   Pt is calling back to f/u, he said if Rosann Auerbach can call him back because he will not take crestor until he speaks with her

## 2020-12-31 MED ORDER — PRAVASTATIN SODIUM 40 MG PO TABS: 40.0000 mg | ORAL_TABLET | Freq: Every evening | ORAL | 0 refills | Status: DC

## 2020-12-31 NOTE — Telephone Encounter (Signed)
Spoke with pt and advised per Dr Caryl Comes, pt may resume Pravastatin '40mg'$  - 1 tablet by mouth nightly.  Pt requests partial script to [facility] requires a hard script to fill.  Pravastatin '40mg'$  #90 with 0 refills sent to pharmacy as requested and hard copy Rx for Pravastatin 40 #90 with 3 RF placed at front desk for pt to pick up. Pt verbalizes understanding and thanked Therapist, sports for the phone call.  Rosuvastatin removed from pt's medication list.

## 2021-01-01 MED ORDER — PRAVASTATIN SODIUM 80 MG PO TABS: 80.0000 mg | ORAL_TABLET | Freq: Every evening | ORAL | 0 refills | Status: DC

## 2021-01-01 NOTE — Addendum Note (Signed)
Addended by: Thora Lance on: 01/01/2021 09:20 AM   Modules accepted: Orders

## 2021-01-01 NOTE — Telephone Encounter (Signed)
Pt c/o medication issue:  1. Name of Medication: Pravastatin 80 MG   2. How are you currently taking this medication (dosage and times per day)? 1 tablet a day   3. Are you having a reaction (difficulty breathing--STAT)? No   4. What is your medication issue? Monica is calling stating that he is actually on 65 MG's instead of 40 MG's. He canceled the 40 MG prescription and is requesting a new prescription be sent to [place] and another be written for the [place] that he can pick up Monday. Requesting 90 day supply's .

## 2021-01-01 NOTE — Telephone Encounter (Signed)
Reviewed pt's previous Pravastatin dosage.  Pt was previously taking Pravastatin '80mg'$  - 1 tablet daily.  Pravastain '40mg'$  - 1 tablet by mouth daily discontinued and new Rx for Pravastatin '80mg'$  sent to pharmacy as requested.  Hard copy of Pravastain '80mg'$  - 1 tablet by mouth #90 with 3 RF placed at front desk for pt to pick up to take to [place] as requested.

## 2021-01-05 DIAGNOSIS — M25561 Pain in right knee: Secondary | ICD-10-CM | POA: Diagnosis not present

## 2021-01-12 DIAGNOSIS — M1711 Unilateral primary osteoarthritis, right knee: Secondary | ICD-10-CM | POA: Diagnosis not present

## 2021-01-12 DIAGNOSIS — M25561 Pain in right knee: Secondary | ICD-10-CM | POA: Diagnosis not present

## 2021-01-12 DIAGNOSIS — S83231D Complex tear of medial meniscus, current injury, right knee, subsequent encounter: Secondary | ICD-10-CM | POA: Diagnosis not present

## 2021-01-28 DIAGNOSIS — Z23 Encounter for immunization: Secondary | ICD-10-CM | POA: Diagnosis not present

## 2021-02-03 DIAGNOSIS — H401112 Primary open-angle glaucoma, right eye, moderate stage: Secondary | ICD-10-CM | POA: Diagnosis not present

## 2021-02-03 DIAGNOSIS — H35373 Puckering of macula, bilateral: Secondary | ICD-10-CM | POA: Diagnosis not present

## 2021-02-03 DIAGNOSIS — H5319 Other subjective visual disturbances: Secondary | ICD-10-CM | POA: Diagnosis not present

## 2021-02-03 DIAGNOSIS — H43813 Vitreous degeneration, bilateral: Secondary | ICD-10-CM | POA: Diagnosis not present

## 2021-02-03 DIAGNOSIS — Z961 Presence of intraocular lens: Secondary | ICD-10-CM | POA: Diagnosis not present

## 2021-02-03 DIAGNOSIS — H40052 Ocular hypertension, left eye: Secondary | ICD-10-CM | POA: Diagnosis not present

## 2021-02-03 DIAGNOSIS — H0102B Squamous blepharitis left eye, upper and lower eyelids: Secondary | ICD-10-CM | POA: Diagnosis not present

## 2021-02-03 DIAGNOSIS — H16223 Keratoconjunctivitis sicca, not specified as Sjogren's, bilateral: Secondary | ICD-10-CM | POA: Diagnosis not present

## 2021-02-03 DIAGNOSIS — H34212 Partial retinal artery occlusion, left eye: Secondary | ICD-10-CM | POA: Diagnosis not present

## 2021-02-03 DIAGNOSIS — H0102A Squamous blepharitis right eye, upper and lower eyelids: Secondary | ICD-10-CM | POA: Diagnosis not present

## 2021-02-10 DIAGNOSIS — M1711 Unilateral primary osteoarthritis, right knee: Secondary | ICD-10-CM | POA: Diagnosis not present

## 2021-02-10 DIAGNOSIS — M25561 Pain in right knee: Secondary | ICD-10-CM | POA: Diagnosis not present

## 2021-02-11 DIAGNOSIS — M1711 Unilateral primary osteoarthritis, right knee: Secondary | ICD-10-CM | POA: Diagnosis not present

## 2021-04-07 ENCOUNTER — Telehealth: Payer: Self-pay | Admitting: Family Medicine

## 2021-04-07 NOTE — Chronic Care Management (AMB) (Signed)
°  Chronic Care Management   Note  04/07/2021 Name: [name] [name] MRN: [id number] DOB: 1934/06/05  [name] [name] is a 86 y.o. year old male who is a primary care patient of Tonia Ghent, MD. I reached out to Fae Pippin by phone today in response to a referral sent by [name] [name]'s PCP, Tonia Ghent, MD.   [name] [name] was given information about Chronic Care Management services today including:  CCM service includes personalized support from designated clinical staff supervised by his physician, including individualized plan of care and coordination with other care providers 24/7 contact phone numbers for assistance for urgent and routine care needs. Service will only be billed when office clinical staff spend 20 minutes or more in a month to coordinate care. Only one practitioner may furnish and bill the service in a calendar month. The patient may stop CCM services at any time (effective at the end of the month) by phone call to the office staff.   Patient agreed to services and verbal consent obtained.   Follow up plan:   Tatjana Secretary/administrator

## 2021-04-12 ENCOUNTER — Other Ambulatory Visit: Payer: Self-pay

## 2021-04-12 ENCOUNTER — Encounter: Payer: Self-pay | Admitting: Family Medicine

## 2021-04-12 ENCOUNTER — Telehealth (INDEPENDENT_AMBULATORY_CARE_PROVIDER_SITE_OTHER): Payer: Medicare Other | Admitting: Family Medicine

## 2021-04-12 VITALS — BP 147/78 | HR 98 | Temp 99.8°F | Ht 70.0 in

## 2021-04-12 DIAGNOSIS — I6522 Occlusion and stenosis of left carotid artery: Secondary | ICD-10-CM

## 2021-04-12 DIAGNOSIS — R051 Acute cough: Secondary | ICD-10-CM | POA: Diagnosis not present

## 2021-04-12 DIAGNOSIS — U071 COVID-19: Secondary | ICD-10-CM | POA: Diagnosis not present

## 2021-04-12 DIAGNOSIS — J029 Acute pharyngitis, unspecified: Secondary | ICD-10-CM

## 2021-04-12 DIAGNOSIS — Z20822 Contact with and (suspected) exposure to covid-19: Secondary | ICD-10-CM | POA: Diagnosis not present

## 2021-04-12 DIAGNOSIS — R509 Fever, unspecified: Secondary | ICD-10-CM

## 2021-04-12 LAB — POC INFLUENZA A&B (BINAX/QUICKVUE)
Influenza A, POC: NEGATIVE
Influenza B, POC: NEGATIVE

## 2021-04-12 LAB — POC COVID19 BINAXNOW: SARS Coronavirus 2 Ag: POSITIVE — AB

## 2021-04-12 MED ORDER — MOLNUPIRAVIR EUA 200MG CAPSULE: 4.0000 | ORAL_CAPSULE | Freq: Two times a day (BID) | ORAL | 0 refills | Status: AC

## 2021-04-12 NOTE — Progress Notes (Addendum)
[name] Dugo T. Reubin Bushnell, MD Primary Care and Sports Medicine [facility] [place], [address] Phone: [phone]   FAX: [phone]  DESIREE FLEMING - 86 y.o. male   MRN [id number]   Date of Birth: November 27, 1934  Visit Date: 04/12/2021   PCP: Tonia Ghent, MD   Referred by: Tonia Ghent, MD  Virtual Visit via Video Note:  I connected with  [name] [name] on 04/12/2021 11:40 AM EST by a video enabled telemedicine application and verified that I am speaking with the correct person using two identifiers.   Location patient: home computer, tablet, or smartphone Location provider: work or home office Consent: Verbal consent directly obtained from [employer] Persons participating in the virtual visit: patient, provider  I discussed the limitations of evaluation and management by telemedicine and the availability of in person appointments. The patient expressed understanding and agreed to proceed.  Chief Complaint  Patient presents with   Headache    Pressure in forehead/around eyes-No Covid Test-symptoms started Friday   Fever   Insomnia   Sore Throat   Nasal Congestion   Cough    History of Present Illness:  Last moday, 200 people party.  He and his wife went to a large party 1 week ago, and at that point they were exposed to a lot of people.  No masking, and close exposure.  Starting on Saturday he started to get some significant symptoms, chills, sore throat, nasal congestion and coughing.  He also has had a frontal headache. Fever is ranged from 99 1-100.8 yesterday.  He has been normotensive with a normal pulse.  His wife is also sick.  She started to feel sick last Wednesday.  Frontal ha Chills Fever 99.1 - 100.8 yesterday St Blowing nose - sometimes will  147/77  Review of Systems as above: See pertinent positives and pertinent negatives per HPI No acute distress verbally    Observations/Objective/Exam:  An attempt was made to discern vital signs over the phone and per patient if applicable and possible.   General:    Alert, Oriented, appears well and in no acute distress  Pulmonary:     On inspection no signs of respiratory distress.  Psych / Neurological:     Pleasant and cooperative.  Assessment and Plan:    ICD-10-CM   1. COVID-19  U07.1     2. Suspected COVID-19 virus infection  Z20.822 Novel Coronavirus, NAA ([facility])    POC COVID-19 BinaxNow    3. Fever, unspecified fever cause  R50.9 Novel Coronavirus, NAA ([facility])    POC Influenza A&B(BINAX/QUICKVUE)    POC COVID-19 BinaxNow    4. Acute cough  R05.1 Novel Coronavirus, NAA ([facility])    POC Influenza A&B(BINAX/QUICKVUE)    POC COVID-19 BinaxNow    5. Sore throat  J02.9 Novel Coronavirus, NAA ([facility])    POC Influenza A&B(BINAX/QUICKVUE)    POC COVID-19 BinaxNow     COVID versus influenza, other viral infection possible.  I think that the primary question that needs to be answered right now is does he have COVID-19.  High risk patient, he should be addressed with antivirals.  There is also quite relevant for family exposure in the Christmas holiday.  They will come in for testing this afternoon.  Covid + this afternoon on testing, will send I antivirals.   I discussed the assessment and treatment plan with the patient. The patient was provided an opportunity  to ask questions and all were answered. The patient agreed with the plan and demonstrated an understanding of the instructions.   The patient was advised to call back or seek an in-person evaluation if the symptoms worsen or if the condition fails to improve as anticipated.  Follow-up: prn unless noted otherwise below No follow-ups on file.  Meds ordered this encounter  Medications   molnupiravir EUA (LAGEVRIO) 200 mg CAPS capsule    Sig: Take 4 capsules (800 mg total) by mouth 2 (two) times daily for 5 days.     Dispense:  40 capsule    Refill:  0   Orders Placed This Encounter  Procedures   Novel Coronavirus, NAA ([facility])   POC Influenza A&B(BINAX/QUICKVUE)   POC COVID-19 BinaxNow    Signed,  Levetta Bognar T. Lorinda , MD

## 2021-04-12 NOTE — Addendum Note (Signed)
Addended by: Carter Kitten on: 04/12/2021 03:15 PM   Modules accepted: Orders

## 2021-04-12 NOTE — Addendum Note (Signed)
Addended by: Owens Loffler on: 04/12/2021 03:14 PM   Modules accepted: Orders

## 2021-05-03 ENCOUNTER — Encounter (HOSPITAL_COMMUNITY): Payer: Medicare Other

## 2021-05-03 ENCOUNTER — Ambulatory Visit: Payer: Medicare Other

## 2021-05-13 ENCOUNTER — Telehealth: Payer: Self-pay

## 2021-05-13 NOTE — Chronic Care Management (AMB) (Signed)
Chronic Care Management Pharmacy Assistant   Name: [name] [name]  MRN: [id number] DOB: 05-Apr-1935  [name] [name] is an 87 y.o. year old male who presents for his initial CCM visit with the clinical pharmacist.  Reason for Encounter: Initial Questions   Conditions to be addressed/monitored: CAD, HTN, and HLD   Recent office visits:  11/23/20-PCP-Graham Duncan,MD-Patient presented for right knee pain. Discussed imaging,D/w pt about biofreeze vs OTC liniment vs voltaren gel.  Discussed holding crestor for 1 week, labs ordered (lipids are better) Recent consult visits:  03/22/21-[facility].-Optometry- [name] [name] presented for eye exam.No medication changes,follow up 6 months.  Hospital visits:  None in previous 6 months  Medications: Outpatient Encounter Medications as of 05/13/2021  Medication Sig   acetaminophen (TYLENOL) 325 MG tablet Take 650 mg by mouth once as needed for moderate pain or headache.   apixaban (ELIQUIS) 5 MG TABS tablet Take 1 tablet (5 mg total) by mouth 2 (two) times daily. Take evening dose starting 07/09/20   butalbital-acetaminophen-caffeine (FIORICET, ESGIC) 50-325-40 MG per tablet Take 1 tablet by mouth 3 (three) times daily as needed for migraine.    diltiazem (CARDIZEM CD) 120 MG 24 hr capsule Take 1 capsule (120 mg) by mouth once daily   fluticasone (FLONASE) 50 MCG/ACT nasal spray Place 1 spray into both nostrils daily.   hydrochlorothiazide (HYDRODIURIL) 25 MG tablet Take 1 tablet (25 mg total) by mouth daily.   latanoprost (XALATAN) 0.005 % ophthalmic solution INSTILL 1 DROP IN BOTH EYES AT BEDTIME -  CONTACT PHARMACY WHEN NEW SUPPLY IS NEEDED   loratadine (CLARITIN) 10 MG tablet Take 10 mg by mouth daily.   Multiple Vitamins-Minerals (MULTIVITAMIN WITH MINERALS) tablet Take 1 tablet by mouth daily.   Polyethyl Glycol-Propyl Glycol (SYSTANE ULTRA OP) Apply 1-2 drops to eye once as needed (dry eyes.).    potassium  chloride (K-DUR) 10 MEQ tablet Take 10 mEq by mouth every morning. Take as directed   pravastatin (PRAVACHOL) 80 MG tablet Take 1 tablet (80 mg total) by mouth every evening.   Propylene Glycol 0.6 % SOLN INSTILL 1 DROP IN EACH EYE 2-4 TIMES DAILY -  CONTACT PHARMACY WHEN NEW SUPPLY IS NEEDED   No facility-administered encounter medications on file as of 05/13/2021.    Lab Results  Component Value Date/Time   HGBA1C 5.9 10/27/2020 12:58 PM   HGBA1C 5.6 03/11/2020 03:41 AM     BP Readings from Last 3 Encounters:  04/12/21 (!) 147/78  11/23/20 128/78  10/27/20 (!) 150/80    Patient contacted to review initial questions prior to visit with Charlene Brooke.  Have you seen any other providers since your last visit with PCP? Yes  [facility]  for [facility]  Any changes in your medications or health? No  Any side effects from any medications? No  Do you have an symptoms or problems not managed by your medications? No  Any concerns about your health right now? No  Has your provider asked that you check blood pressure, blood sugar, or follow special diet at home? No  Do you get any type of exercise on a regular basis? The patient reports stays busy around the house, all upkeep and yard work  Can you think of a goal you would like to reach for your health? No  Do you have any problems getting your medications? No The patient reports he uses the [place] for medications  Is there anything that you would like to discuss during the  appointment? No  Counseled patient on the purpose of visit with [facility] with patient and reminded them to have all medications, supplements and any blood glucose and blood pressure readings available for review with pharmacist, at their telephone visit on 05/20/21 at 2:00pm .   Star Rating Drugs:  Medication:  Last Fill: Day Supply Pravastatin 80mg    Care Gaps: Annual wellness visit in last year? Yes Most Recent BP reading:128/78   89-P  Marjo Bicker, CPP notified  Avel Sensor, Gantt Assistant [phone]  Total time spent for month CPA: 40 min.

## 2021-05-20 ENCOUNTER — Ambulatory Visit (INDEPENDENT_AMBULATORY_CARE_PROVIDER_SITE_OTHER): Payer: Medicare Other | Admitting: Pharmacist

## 2021-05-20 ENCOUNTER — Other Ambulatory Visit: Payer: Self-pay

## 2021-05-20 DIAGNOSIS — E785 Hyperlipidemia, unspecified: Secondary | ICD-10-CM

## 2021-05-20 DIAGNOSIS — I25810 Atherosclerosis of coronary artery bypass graft(s) without angina pectoris: Secondary | ICD-10-CM

## 2021-05-20 DIAGNOSIS — I10 Essential (primary) hypertension: Secondary | ICD-10-CM

## 2021-05-20 DIAGNOSIS — I4821 Permanent atrial fibrillation: Secondary | ICD-10-CM

## 2021-05-20 NOTE — Progress Notes (Signed)
Chronic Care Management Pharmacy Note  05/20/2021 Name:  [name] [name] MRN:  [id number] DOB:  22-Feb-1935  Summary: -CCM initial vist: pt endorses compliance with medications, denies issues -BP is at goal at home (130/70s) -Pt reports chronic cough that he seen multiple doctors for - could be reflux or post-nasal drip  Recommendations/Changes made from today's visit: -Advised to switch antihistamine from loratadine to Zyrtec, Allegra, or Xyzal  Plan: -[place] will call patient 6 months for adherence review -Pharmacist follow up televisit scheduled for 1 year -AWV due June 2023; not yet scheduled    Subjective: [name] [name] is an 87 y.o. year old male who is a primary patient of Damita Dunnings, Elveria Rising, MD.  The CCM team was consulted for assistance with disease management and care coordination needs.    Engaged with patient by telephone for initial visit in response to provider referral for pharmacy case management and/or care coordination services.   Consent to Services:  The patient was given the following information about Chronic Care Management services today, agreed to services, and gave verbal consent: 1. CCM service includes personalized support from designated clinical staff supervised by the primary care provider, including individualized plan of care and coordination with other care providers 2. 24/7 contact phone numbers for assistance for urgent and routine care needs. 3. Service will only be billed when office clinical staff spend 20 minutes or more in a month to coordinate care. 4. Only one practitioner may furnish and bill the service in a calendar month. 5.The patient may stop CCM services at any time (effective at the end of the month) by phone call to the office staff. 6. The patient will be responsible for cost sharing (co-pay) of up to 20% of the service fee (after annual deductible is met). Patient agreed to services and consent obtained.  Patient  Care Team: Tonia Ghent, MD as PCP - General (Family Medicine) Deboraha Sprang, MD as PCP - Cardiology (Cardiology) [name], [facility] as Pharmacist (Pharmacist)  Patient lives at home with his wife.   Recent office visits: 04/12/21 Dr Lorelei Pont VV: covid-19; Rx'd molnupiravir x 5 days 11/23/20-PCP-Graham Duncan,MD-Patient presented for right knee pain. Discussed imaging,D/w pt about biofreeze vs OTC liniment vs voltaren gel.  Discussed holding crestor for 1 week, labs ordered (lipids are better)  Recent consult visits: 03/22/21-[facility]Optometry- Alycia Patten presented for eye exam.No medication changes,follow up 6 months.  12/29/20 Cardiology phone note: pt had muscle aches with TIW rosuvastatin, adivsed to resume pravastatin 80 mg (hard copy rx provided)  Hospital visits: None in previous 6 months   Objective:  Lab Results  Component Value Date   CREATININE 0.92 10/27/2020   BUN 14 10/27/2020   GFR 76.59 10/27/2020   GFRNONAA 56 (L) 07/09/2020   GFRAA 83 09/02/2019   NA 140 10/27/2020   K 3.9 10/27/2020   CALCIUM 9.6 10/27/2020   CO2 31 10/27/2020   GLUCOSE 96 10/27/2020    Lab Results  Component Value Date/Time   HGBA1C 5.9 10/27/2020 12:58 PM   HGBA1C 5.6 03/11/2020 03:41 AM   GFR 76.59 10/27/2020 12:58 PM   GFR 73.87 12/10/2019 08:56 AM    Last diabetic Eye exam: No results found for: HMDIABEYEEXA  Last diabetic Foot exam: No results found for: HMDIABFOOTEX   Lab Results  Component Value Date   CHOL 157 11/23/2020   HDL 63.80 11/23/2020   LDLCALC 80 11/23/2020   LDLDIRECT 88 07/27/2020   TRIG  66.0 11/23/2020   CHOLHDL 2 11/23/2020    Hepatic Function Latest Ref Rng & Units 10/27/2020 07/02/2020 03/10/2020  Total Protein 6.0 - 8.3 g/dL 7.2 7.0 7.0  Albumin 3.5 - 5.2 g/dL 4.3 4.0 3.9  AST 0 - 37 U/L $Remo'21 26 28  'PYokd$ ALT 0 - 53 U/L $Remo'11 16 15  'RFUxK$ Alk Phosphatase 39 - 117 U/L 76 67 65  Total Bilirubin 0.2 - 1.2 mg/dL 0.7 0.6 0.5   Bilirubin, Direct <=0.2 mg/dL - - -    Lab Results  Component Value Date/Time   TSH 1.76 10/27/2020 12:58 PM   TSH 3.14 12/10/2019 08:56 AM    CBC Latest Ref Rng & Units 10/27/2020 07/09/2020 07/08/2020  WBC 4.0 - 10.5 K/uL 5.2 10.7(H) 6.4  Hemoglobin 13.0 - 17.0 g/dL 13.5 12.5(L) 13.1  Hematocrit 39.0 - 52.0 % 39.7 35.7(L) 37.9(L)  Platelets 150.0 - 400.0 K/uL 213.0 176 188    No results found for: VD25OH  Clinical ASCVD: Yes  The ASCVD Risk score (Arnett DK, et al., 2019) failed to calculate for the following reasons:   The 2019 ASCVD risk score is only valid for ages 3 to 17   The patient has a prior MI or stroke diagnosis    Depression screen [facility] 2/9 10/19/2020 03/17/2020 08/22/2019  Decreased Interest 0 0 0  Down, Depressed, Hopeless 0 0 0  PHQ - 2 Score 0 0 0  Altered sleeping 0 - 0  Tired, decreased energy 0 - 0  Change in appetite 0 - 0  Feeling bad or failure about yourself  0 - 0  Trouble concentrating 0 - 0  Moving slowly or fidgety/restless 0 - 0  Suicidal thoughts 0 - 0  PHQ-9 Score 0 - 0  Difficult doing work/chores Not difficult at all - Not difficult at all  Some recent data might be hidden    CHA2DS2/VAS Stroke Risk Points  Current as of 2 days ago (Tuesday)     6 >= 2 Points: High Risk  1 - 1.99 Points: Medium Risk  0 Points: Low Risk    No Change     Points Metrics  0 Has Congestive Heart Failure:  No    Current as of 2 days ago (Tuesday)  1 Has Vascular Disease:  Yes    Current as of 2 days ago (Tuesday)  1 Has Hypertension:  Yes    Current as of 2 days ago (Tuesday)  2 Age:  48    Current as of 2 days ago (Tuesday)  0 Has Diabetes:  No    Current as of 2 days ago (Tuesday)  2 Had Stroke:  Yes  Had TIA:  No  Had Thromboembolism:  No    Current as of 2 days ago (Tuesday)  0 Male:  No    Current as of 2 days ago (Tuesday)    Social History   Tobacco Use  Smoking Status Former   Packs/day: 1.00   Years: 25.00   Pack years: 25.00    Types: Cigarettes   Quit date: 04/25/1980   Years since quitting: 41.0  Smokeless Tobacco Never   BP Readings from Last 3 Encounters:  04/12/21 (!) 147/78  11/23/20 128/78  10/27/20 (!) 150/80   Pulse Readings from Last 3 Encounters:  04/12/21 98  11/23/20 89  10/27/20 96   Wt Readings from Last 3 Encounters:  11/23/20 190 lb (86.2 kg)  10/27/20 190 lb (86.2 kg)  09/17/20 194 lb (88 kg)  BMI Readings from Last 3 Encounters:  04/12/21 27.26 kg/m  11/23/20 27.26 kg/m  10/27/20 27.26 kg/m    Assessment/Interventions: Review of patient past medical history, allergies, medications, health status, including review of consultants reports, laboratory and other test data, was performed as part of comprehensive evaluation and provision of chronic care management services.   SDOH:  (Social Determinants of Health) assessments and interventions performed: Yes SDOH Interventions    Flowsheet Row Most Recent Value  SDOH Interventions   Food Insecurity Interventions Intervention Not Indicated  Financial Strain Interventions Intervention Not Indicated      SDOH Screenings   Alcohol Screen: Low Risk    Last Alcohol Screening Score (AUDIT): 0  Depression (PHQ2-9): Low Risk    PHQ-2 Score: 0  Financial Resource Strain: Low Risk    Difficulty of Paying Living Expenses: Not hard at all  Food Insecurity: No Food Insecurity   Worried About Charity fundraiser in the Last Year: Never true   Ran Out of Food in the Last Year: Never true  Housing: Low Risk    Last Housing Risk Score: 0  Physical Activity: Inactive   Days of Exercise per Week: 0 days   Minutes of Exercise per Session: 0 min  Social Connections: Not on file  Stress: No Stress Concern Present   Feeling of Stress : Not at all  Tobacco Use: Medium Risk   Smoking Tobacco Use: Former   Smokeless Tobacco Use: Never   Passive Exposure: Not on file  Transportation Needs: No Transportation Needs   Lack of Transportation  (Medical): No   Lack of Transportation (Non-Medical): No    CCM Care Plan  Allergies  Allergen Reactions   Ace Inhibitors Other (See Comments)    COUGH   Zocor [Simvastatin] Other (See Comments)    MYALGIA   Nsaids     Would avoid given anticoagulation   Prednisone Other (See Comments)    Prednisone caused pt to have elevated BP.   Cephalexin Rash   Chocolate     Headaches    Doxycycline Other (See Comments)    Nausea and abd pain   Gabapentin Other (See Comments)    Dizziness   Hycodan [Hydrocodone Bit-Homatrop Mbr] Nausea And Vomiting   Oxycodone Nausea Only    Pt states that he cannot take prescription pain medication    Medications Reviewed Today     Reviewed by Carter Kitten, CMA (Certified Medical Assistant) on 04/12/21 at 1147  Med List Status: <None>   Medication Order Taking? Sig Documenting Provider Last Dose Status Informant  acetaminophen (TYLENOL) 325 MG tablet [id number]  Take 650 mg by mouth once as needed for moderate pain or headache. [provider]  Active Self  apixaban (ELIQUIS) 5 MG TABS tablet [id number]  Take 1 tablet (5 mg total) by mouth 2 (two) times daily. Take evening dose starting 07/09/20 Karoline Caldwell, PA-C  Active   butalbital-acetaminophen-caffeine (FIORICET, ESGIC) 50-325-40 MG per tablet [id number]  Take 1 tablet by mouth 3 (three) times daily as needed for migraine.  [provider]  Active Self           Med Note Carrolyn Meiers Mar 17, 2020 11:06 AM)    diltiazem (CARDIZEM CD) 120 MG 24 hr capsule [id number]  Take 1 capsule (120 mg) by mouth once daily Deboraha Sprang, MD  Active Self           Med Note Damita Dunnings, Elveria Rising  Tue Mar 17, 2020 11:06 AM)    fluticasone (FLONASE) 50 MCG/ACT nasal spray [id number]  Place 1 spray into both nostrils daily. Tonia Ghent, MD  Active   hydrochlorothiazide (HYDRODIURIL) 25 MG tablet [id number]  Take 1 tablet (25 mg total) by mouth daily. Deboraha Sprang, MD  Active    latanoprost (XALATAN) 0.005 % ophthalmic solution [id number]  INSTILL 1 DROP IN BOTH EYES AT BEDTIME -  CONTACT PHARMACY WHEN NEW SUPPLY IS NEEDED [provider]  Active   loratadine (CLARITIN) 10 MG tablet [id number]  Take 10 mg by mouth daily. [provider]  Active Self           Med Note Carrolyn Meiers Mar 17, 2020 11:07 AM)    Multiple Vitamins-Minerals (MULTIVITAMIN WITH MINERALS) tablet [id number]  Take 1 tablet by mouth daily. [provider]  Active Self  Polyethyl Glycol-Propyl Glycol (SYSTANE ULTRA OP) [id number]  Apply 1-2 drops to eye once as needed (dry eyes.).  [provider]  Active Self  potassium chloride (K-DUR) 10 MEQ tablet [id number]  Take 10 mEq by mouth every morning. Take as directed Deboraha Sprang, MD  Active Self           Med Note Damita Dunnings, Laverda Sorenson Mar 17, 2020 11:07 AM)    pravastatin (PRAVACHOL) 80 MG tablet [id number]  Take 1 tablet (80 mg total) by mouth every evening. Deboraha Sprang, MD  Active   Propylene Glycol 0.6 % SOLN [id number]  INSTILL 1 DROP IN [facility] EYE 2-4 TIMES DAILY -  CONTACT PHARMACY WHEN NEW SUPPLY IS NEEDED [provider]  Active             Patient Active Problem List   Diagnosis Date Noted   Advance care planning 10/28/2020   Right knee pain 09/21/2020   Traumatic leg ulcer ([place]) 08/14/2020   Bilateral pseudophakia 07/27/2020   Carpal tunnel syndrome 07/27/2020   Dermatochalasis 07/27/2020   Headache 07/27/2020   Hematuria 07/27/2020   Insomnia 07/27/2020   Postsurgical aortocoronary bypass status 07/27/2020   Pure hypercholesterolemia 07/27/2020   Radiculopathy 07/27/2020   Vitamin D deficiency 07/27/2020   Stenosis of internal carotid artery with cerebral infarction, right ([place]) 07/08/2020   Glaucoma (increased eye pressure) 06/19/2020   Stress reaction 06/19/2020   Imbalance 03/27/2020   Lightheadedness 03/27/2020   Retinal artery occlusion 03/10/2020   Constipation  12/22/2019   Right foot pain 09/11/2019   Leg pain 06/23/2019   Muscle pain 04/04/2019   Laryngopharyngeal reflux (LPR) 05/22/2018   Rhinitis sicca 05/22/2018   Sciatica 12/28/2017   Neuropathy 10/07/2017   Foot swelling 10/07/2017   Shoulder pain 03/29/2017   CAD (coronary artery disease), autologous vein bypass graft 10/31/2016   Family history of colon cancer 10/31/2016   History of adenomatous polyp of colon 10/31/2016   Osteoarthritis 10/31/2016   Atrial fibrillation ([place]) 10/31/2016   Syncope 02/11/2016   High risk medication use    Skin lesion 06/04/2015   Lower back pain 05/01/2015   Elevated TSH 12/26/2014   Back pain 01/22/2014   Aortic stenosis 05/16/2013   Bladder neoplasm 11/23/2012   BCC (basal cell carcinoma of skin) 08/29/2012   Vertigo 08/24/2012   GERD (gastroesophageal reflux disease) 05/01/2012   Hyperlipidemia 03/14/2012   TMJ pain dysfunction syndrome 01/20/2012   Long term (current) use of anticoagulants 08/31/2011   RVOT ventricular tachycardia ([place])    Cough 04/29/2009   HEARING LOSS,  SUDDEN 12/03/2007   Essential hypertension 02/22/2007   PAF (paroxysmal atrial fibrillation) ([place]) 02/22/2007    Immunization History  Administered Date(s) Administered   Fluad Quad(high Dose 65+) 12/28/2018, 02/17/2020   Influenza Split 03/15/2011, 01/19/2012   Influenza Whole 03/11/2002, 02/24/2009, 01/14/2010   Influenza, High Dose Seasonal PF 12/25/2015, 02/19/2017   Influenza,inj,Quad PF,6+ Mos 01/16/2013, 01/22/2014, 12/25/2014, 01/19/2016, 01/23/2017, 01/04/2018   Influenza-Unspecified 03/11/2002, 02/19/2003, 01/24/2004, 02/23/2005, 01/23/2006, 04/03/2007, 02/21/2008, 01/23/2009, 02/23/2009, 12/24/2009, 02/12/2020   PFIZER(Purple Top)SARS-COV-2 Vaccination 06/28/2019, 07/19/2019, 09/18/2019, 03/24/2020   Pneumococcal Conjugate-13 11/27/2013, 12/25/2014   Pneumococcal Polysaccharide-23 04/17/2002   Pneumococcal-Unspecified 03/11/2002   Td 12/07/2001,  04/26/2005, 05/11/2005   Tdap 12/08/2011   Tetanus 11/24/2011   Zoster Recombinat (Shingrix) 08/20/2020, 01/01/2021   Zoster, Live 05/17/2007    Conditions to be addressed/monitored:  Hypertension, Hyperlipidemia, Atrial Fibrillation, Coronary Artery Disease, and Allergic Rhinitis  Care Plan : [place]  Updates made by [name], [place] since 05/20/2021 12:00 AM     Problem: Hypertension, Hyperlipidemia, Atrial Fibrillation, Coronary Artery Disease, and Allergic Rhinitis   Priority: High     Long-Range Goal: Disease mgmt   Start Date: 05/20/2021  Expected End Date: 05/20/2022  This Visit's Progress: On track  Priority: High  Note:   Current Barriers:  Unable to independently monitor therapeutic efficacy  Pharmacist Clinical Goal(s):  Patient will achieve adherence to monitoring guidelines and medication adherence to achieve therapeutic efficacy through collaboration with PharmD and provider.   Interventions: 1:1 collaboration with Tonia Ghent, MD regarding development and update of comprehensive plan of care as evidenced by provider attestation and co-signature Inter-disciplinary care team collaboration (see longitudinal plan of care) Comprehensive medication review performed; medication list updated in electronic medical record  Hypertension (BP goal <140/90) -Controlled - BP at home is at goal per pt report; he reports white coat syndrome responsible for elevated office readings -Current home BP readings: 130/70s -Current treatment: Diltiazem CD 120 mg daily - Appropriate, Effective, Safe, Accessible HCTZ 25 mg daily -Appropriate, Effective, Safe, Accessible -Medications previously tried: ace inhibitors (cough)  -Denies hypotensive/hypertensive symptoms -Educated on BP goals and benefits of medications for prevention of heart attack, stroke and kidney damage; -Counseled to monitor BP at home periodically, document, and provide log at future  appointments -Recommended to continue current medication  Hyperlipidemia: (LDL goal < 70) -Unknown control - LDL was near goal 11/2020 however pt was on a different statin at that time; lipids have not been re-checked since switching back to pravastatin -Hx CAD s/p CABG; retinal artery occlusion; cerebral infarction -Current treatment: Pravastatin 80 mg daily 7pm -Appropriate, Effective, Safe, Accessible -Medications previously tried: simvastatin, rosuvastatin TIW  -Educated on Cholesterol goals; Benefits of statin for ASCVD risk reduction; -Recommended to continue current medication  Atrial Fibrillation (Goal: prevent stroke and major bleeding) -Controlled - pt endorses compliance with Eliquis; he denies bleeding issues; he avoids NSAIDs and aspirin -CHADSVASC: 6 -Current treatment: Diltiazem CD 120 mg daily -Appropriate, Effective, Safe, Accessible Eliquis 5 mg BID -Appropriate, Effective, Safe, Accessible -Medications previously tried: warfarin -Counseled on increased risk of stroke due to Afib and benefits of anticoagulation for stroke prevention; importance of adherence to anticoagulant exactly as prescribed; bleeding risk associated with Eliquis and importance of self-monitoring for signs/symptoms of bleeding; avoidance of NSAIDs due to increased bleeding risk with anticoagulants; -Recommended to continue current medication  Glaucoma (Goal: maintain IOP) -Controlled -Retinal artery occlusion 06/2020. Elevated IOP shortly after, taking Xalatan to "prevent glaucoma" -Current treatment  Latanoprost 0.005% eye  drops -Appropriate, Effective, Safe, Accessible Systane eye drops -Recommended to continue current medication  Headaches (Goal: manage symptoms) -Controlled - pt reports chocolate triggers headaches. He has HA 4-5 times a week usually caused by food -Current treatment  Butalbital-APAP-caffeine PRN -Appropriate, Effective, Safe, Accessible Tylenol 325 mg - Appropriate,  Effective, Safe, Accessible -discussed avoidance of triggers -Recommended to continue current medication  Allergic rhinitis / chronic cough (Goal: manage symptoms) -Not ideally controlled - pt reports he has had chronic cough for years, seen by multiple doctors and ENT, nothing has helped -Current treatment  Fluticasone nasal spray - Appropriate, Query Effective, Safe, Accessible Loratadine 10 mg - Appropriate, Query Effective, Safe, Accessible -Discussed using Flonase every day for a few weeks to see if more consistent use helps post-nasal drip; -Advised to switch antihistamine, he may have built tolerance to loratadine. Recommend generic Allegra, Zyrtec or Xyzal  Health Maintenance -Vaccine gaps: Flu, covid booster (pt reports he had Flu shot at [facility] in the fall) -Hx bladder neoplasm 2014 -Current therapy:  Multivitamin Potassium chloride 10 meq daily Melatonin 3 mg daily -Patient is satisfied with current therapy and denies issues -Recommended to continue current medication  Patient Goals/Self-Care Activities Patient will:  - take medications as prescribed as evidenced by patient report and record review focus on medication adherence by routine check blood pressure periodically, document, and provide at future appointments -Try a different allergy med - Zyrtec, Allegra or Xyzal      Medication Assistance: None required.  Patient affirms current coverage meets needs.  Compliance/Adherence/Medication fill history: Care Gaps: None  Star-Rating Drugs: [facility] unavailable. Pt endorses 100% compliance.  Patient's preferred pharmacy is:  [place], [place] - [address] [place] [address] Phone: [phone] Fax: [phone]  [facility] [place] [address] [place] [address] Phone: [phone] Fax:  [place], [place] - [address] [place] [address] Phone: [phone] Fax: [phone]  Uses pill box? No - prefers bottles lined up in kitchen Pt endorses 100% compliance  We discussed: Current pharmacy is preferred with insurance plan and patient is satisfied with pharmacy services Patient decided to: Continue current medication management strategy  Care Plan and Follow Up Patient Decision:  Patient agrees to Care Plan and Follow-up.  Plan: Telephone follow up appointment with care management team member scheduled for:  1 year  Charlene Brooke, PharmD, [facility] Clinical Pharmacist [facility] at [facility] [phone]

## 2021-05-20 NOTE — Patient Instructions (Signed)
Visit Information  Phone number for Pharmacist: [phone]  Thank you for meeting with me to discuss your medications! I look forward to working with you to achieve your health care goals. Below is a summary of what we talked about during the visit:   Goals Addressed             This Visit's Progress    Manage My Medicine       Timeframe:  Long-Range Goal Priority:  Medium Start Date:     05/20/21                        Expected End Date: 05/20/22                      Follow Up Date Jan 2024   - call for medicine refill 2 or 3 days before it runs out - call if I am sick and can't take my medicine - keep a list of all the medicines I take; vitamins and herbals too -Try a different allergy med - Zyrtec, Allegra or Xyzal   Why is this important?   These steps will help you keep on track with your medicines.   Notes:         Care Plan : CCM Pharmacy Care Plan  Updates made by [name], RPH since 05/20/2021 12:00 AM     Problem: Hypertension, Hyperlipidemia, Atrial Fibrillation, Coronary Artery Disease, and Allergic Rhinitis   Priority: High     Long-Range Goal: Disease mgmt   Start Date: 05/20/2021  Expected End Date: 05/20/2022  This Visit's Progress: On track  Priority: High  Note:   Current Barriers:  Unable to independently monitor therapeutic efficacy  Pharmacist Clinical Goal(s):  Patient will achieve adherence to monitoring guidelines and medication adherence to achieve therapeutic efficacy through collaboration with PharmD and provider.   Interventions: 1:1 collaboration with [name] Ghent, MD regarding development and update of comprehensive plan of care as evidenced by provider attestation and co-signature Inter-disciplinary care team collaboration (see longitudinal plan of care) Comprehensive medication review performed; medication list updated in electronic medical record  Hypertension (BP goal <140/90) -Controlled - BP at home is at  goal per pt report; he reports white coat syndrome responsible for elevated office readings -Current home BP readings: 130/70s -Current treatment: Diltiazem CD 120 mg daily - Appropriate, Effective, Safe, Accessible HCTZ 25 mg daily -Appropriate, Effective, Safe, Accessible -Medications previously tried: ace inhibitors (cough)  -Denies hypotensive/hypertensive symptoms -Educated on BP goals and benefits of medications for prevention of heart attack, stroke and kidney damage; -Counseled to monitor BP at home periodically, document, and provide log at future appointments -Recommended to continue current medication  Hyperlipidemia: (LDL goal < 70) -Unknown control - LDL was near goal 11/2020 however pt was on a different statin at that time; lipids have not been re-checked since switching back to pravastatin -Hx CAD s/p CABG; retinal artery occlusion; cerebral infarction -Current treatment: Pravastatin 80 mg daily 7pm -Appropriate, Effective, Safe, Accessible -Medications previously tried: simvastatin, rosuvastatin TIW  -Educated on Cholesterol goals; Benefits of statin for ASCVD risk reduction; -Recommended to continue current medication  Atrial Fibrillation (Goal: prevent stroke and major bleeding) -Controlled - pt endorses compliance with Eliquis; he denies bleeding issues; he avoids NSAIDs and aspirin -CHADSVASC: 6 -Current treatment: Diltiazem CD 120 mg daily -Appropriate, Effective, Safe, Accessible Eliquis 5 mg BID -Appropriate, Effective, Safe, Accessible -Medications previously tried: warfarin -Counseled on increased risk  of stroke due to Afib and benefits of anticoagulation for stroke prevention; importance of adherence to anticoagulant exactly as prescribed; bleeding risk associated with Eliquis and importance of self-monitoring for signs/symptoms of bleeding; avoidance of NSAIDs due to increased bleeding risk with anticoagulants; -Recommended to continue current  medication  Glaucoma (Goal: maintain IOP) -Controlled -Retinal artery occlusion 06/2020. Elevated IOP shortly after, taking Xalatan to "prevent glaucoma" -Current treatment  Latanoprost 0.005% eye drops -Appropriate, Effective, Safe, Accessible Systane eye drops -Recommended to continue current medication  Headaches (Goal: manage symptoms) -Controlled - pt reports chocolate triggers headaches. He has HA 4-5 times a week usually caused by food -Current treatment  Butalbital-APAP-caffeine PRN -Appropriate, Effective, Safe, Accessible Tylenol 325 mg - Appropriate, Effective, Safe, Accessible -discussed avoidance of triggers -Recommended to continue current medication  Allergic rhinitis / chronic cough (Goal: manage symptoms) -Not ideally controlled - pt reports he has had chronic cough for years, seen by multiple doctors and ENT, nothing has helped -Current treatment  Fluticasone nasal spray - Appropriate, Query Effective, Safe, Accessible Loratadine 10 mg - Appropriate, Query Effective, Safe, Accessible -Discussed using Flonase every day for a few weeks to see if more consistent use helps post-nasal drip; -Advised to switch antihistamine, he may have built tolerance to loratadine. Recommend generic Allegra, Zyrtec or Xyzal  Health Maintenance -Vaccine gaps: Flu, covid booster (pt reports he had Flu shot at [facility] in the fall) -Hx bladder neoplasm 2014 -Current therapy:  Multivitamin Potassium chloride 10 meq daily Melatonin 3 mg daily -Patient is satisfied with current therapy and denies issues -Recommended to continue current medication  Patient Goals/Self-Care Activities Patient will:  - take medications as prescribed as evidenced by patient report and record review focus on medication adherence by routine check blood pressure periodically, document, and provide at future appointments -Try a different allergy med - Zyrtec, Allegra or Xyzal      [name] [name] was  given information about Chronic Care Management services today including:  CCM service includes personalized support from designated clinical staff supervised by his physician, including individualized plan of care and coordination with other care providers 24/7 contact phone numbers for assistance for urgent and routine care needs. Standard insurance, coinsurance, copays and deductibles apply for chronic care management only during months in which we provide at least 20 minutes of these services. Most insurances cover these services at 100%, however patients may be responsible for any copay, coinsurance and/or deductible if applicable. This service may help you avoid the need for more expensive face-to-face services. Only one practitioner may furnish and bill the service in a calendar month. The patient may stop CCM services at any time (effective at the end of the month) by phone call to the office staff.  Patient agreed to services and verbal consent obtained.   Patient verbalizes understanding of instructions and care plan provided today and agrees to view in [place]. Active MyChart status confirmed with patient.   Telephone follow up appointment with pharmacy team member scheduled for: 1 year  Charlene Brooke, PharmD, [facility] Clinical Pharmacist [facility] [address] [phone]

## 2021-05-24 ENCOUNTER — Ambulatory Visit (HOSPITAL_COMMUNITY)
Admission: RE | Admit: 2021-05-24 | Discharge: 2021-05-24 | Disposition: A | Discharge status: Home / Self Care | Payer: Medicare Other | Source: Ambulatory Visit | Attending: Physician Assistant | Admitting: Physician Assistant

## 2021-05-24 ENCOUNTER — Ambulatory Visit (INDEPENDENT_AMBULATORY_CARE_PROVIDER_SITE_OTHER): Payer: Medicare Other | Admitting: Physician Assistant

## 2021-05-24 ENCOUNTER — Encounter: Payer: Self-pay | Admitting: Physician Assistant

## 2021-05-24 ENCOUNTER — Other Ambulatory Visit: Payer: Self-pay

## 2021-05-24 VITALS — BP 147/82 | HR 92 | Temp 98.2°F | Resp 18 | Ht 70.0 in | Wt 191.0 lb

## 2021-05-24 DIAGNOSIS — I6522 Occlusion and stenosis of left carotid artery: Secondary | ICD-10-CM | POA: Insufficient documentation

## 2021-05-24 NOTE — Progress Notes (Signed)
History of Present Illness:  Patient is a 87 y.o. year old male who presents for evaluation of carotid stenosis.  He is s/p left CEA 07/08/20 for symptomatic ICA stenosis with left eye vision changes and retinal artery occlusion.  The patient denies symptoms of TIA, amaurosis, or stroke.  He has no difficulty with ADL's.     He is on a Statin, Eliquis  and Cardizem for Afib.      Past Medical History:  Diagnosis Date   Anticoagulated on Coumadin    followed in [facility] at [facility]   Aortic stenosis    a. Echo 2/15: Severe LVH, EF 55-60%, Gr 1 DD, mild to mod AS (mean 20 mmHg, peak 31 mmHg), AVA 1.4-1.5 cm2, mild AI, MAC, trivial MR, LA upper limits of normal, RVSP 22 mmHg, mild RAE;   b. Echo 2/16:  EF 55-60%, no RWMA, mild AS (mean 17 mmHg, peak 27 mmHg), AVA 1.5 cm2, mild AI, MAC, mild LAE   Arthritis    Bladder neoplasm    Chronic cough    NO CARDIAC OR PULMONARY RELATED   Complication of anesthesia    Coronary artery disease CARDIOLOGIST-  DR KLEIN/ [facility]   a. s/p CABG 2004, b. LHC with patent grafts 07/2005, ejection fraction 50%;  c. 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 Amiodarone Rx   S/P CABG x 5 2004   Stroke [facility]) 03/10/2020   Stroke in the Left eye    Past Surgical History:  Procedure Laterality Date   CARDIAC CATHETERIZATION  07-26-2005  DR GAMBLE   PRESERVED LVF/  EF 50%/ PATENT GRAFTS   CARDIAC CATHETERIZATION  03-04-2003  DR GAMBLE   SEVERE 3 VESSEL DISEASE   CARDIAC CATHETERIZATION  1991  DR [facility]   PAF/ FALSE POSITIVE STRESS TEST   CAROTID ENDARTERECTOMY Left 2022    CATARACT EXTRACTION W/ INTRAOCULAR LENS IMPLANT Right    CORONARY ARTERY BYPASS GRAFT  03-08-2003  DR [id number]   LIMA TO LAD/ SVG TO OM2/  SVG TO DIAGONAL / SVG TO PDA & PLA   CYSTOSCOPY WITH BIOPSY N/A 12/25/2012   Procedure: CYSTOSCOPY WITH BLADDER BIOPSY;  Surgeon: Fredricka Bonine, MD;  Location: [facility];  Service: Urology;  Laterality: N/A;   ELECTROPHYSIOLOGY STUDY  07-27-2005  DR Carleene Overlie TAYLOR   MAPPING --   RESULT NONINDUCIBLE VT OR SVT/  DX RIGHT VENTRICULAR OUTFLOW TRACT VT AND RULES OUT MORE MALIGNANT CAUSES OF VT   ENDARTERECTOMY Left 07/08/2020   Procedure: LEFT CAROTID ENDARTERECTOMY;  Surgeon: Serafina Mitchell, MD;  Location: [facility] OR;  Service: Vascular;  Laterality: Left;   INGUINAL HERNIA REPAIR Bilateral [place] ARTHROSCOPY Right 1994   LUMBAR DISC SURGERY  1999   L4 -- L5   MOHS SURGERY     by Dr. Levada Dy 2019   REMOVAL VOCAL CORD POLYPS  1978   TONSILLECTOMY  AS CHILD   TRANSTHORACIC ECHOCARDIOGRAM  08/08/2011   MILD LVH/  EF 55-60%/  GRADE I DIASTOLIC DYSFUNCTION/ MILD AV STENOSIS     Social History Social History   Tobacco Use   Smoking status: Former    Packs/day: 1.00    Years: 25.00    Pack years: 25.00    Types: Cigarettes    Quit date: 04/25/1980    Years since quitting: 41.1   Smokeless tobacco: Never  Vaping Use   Vaping Use: Never used  Substance Use Topics   Alcohol use: No    Alcohol/week: 0.0 standard drinks   Drug use: No    Family History Family History  Problem Relation Age of Onset   Cancer Mother     Allergies  Allergies  Allergen Reactions   Ace Inhibitors Other (See Comments)    COUGH   Zocor [Simvastatin] Other (See Comments)    MYALGIA   Nsaids     Would avoid given anticoagulation   Prednisone Other (See Comments)    Prednisone caused pt to have elevated BP.   Cephalexin Rash   Chocolate     Headaches    Doxycycline Other (See Comments)    Nausea and abd pain   Gabapentin  Other (See Comments)    Dizziness   Hycodan [Hydrocodone Bit-Homatrop Mbr] Nausea And Vomiting   Oxycodone Nausea Only    Pt states that he cannot take prescription pain medication     Current Outpatient Medications  Medication Sig Dispense Refill   acetaminophen (TYLENOL) 325 MG tablet Take 650 mg by mouth once as needed for moderate pain or headache.     apixaban (ELIQUIS) 5 MG TABS tablet Take 1 tablet (5 mg total) by mouth 2 (two) times daily. Take evening dose starting 07/09/20 180 tablet 3   butalbital-acetaminophen-caffeine (FIORICET, ESGIC) 50-325-40 MG per tablet Take 1 tablet by mouth 3 (three) times daily as needed for migraine.      diltiazem (CARDIZEM CD) 120 MG 24 hr capsule Take 1 capsule (120 mg) by mouth once daily     fluticasone (FLONASE) 50 MCG/ACT nasal spray Place 1 spray into both nostrils daily. 16 g 1   hydrochlorothiazide (HYDRODIURIL) 25 MG tablet Take 1 tablet (25 mg total) by mouth daily. 90 tablet 3   latanoprost (XALATAN) 0.005 % ophthalmic solution INSTILL 1 DROP IN BOTH EYES AT BEDTIME -  CONTACT PHARMACY WHEN NEW SUPPLY IS NEEDED     loratadine (CLARITIN) 10 MG tablet Take 10 mg by mouth daily.     melatonin 3 MG TABS tablet Take 3 mg by mouth at bedtime.     Multiple Vitamins-Minerals (MULTIVITAMIN WITH MINERALS) tablet Take 1 tablet by mouth daily.     Polyethyl Glycol-Propyl Glycol (SYSTANE ULTRA OP) Apply 1-2 drops to eye once as needed (dry eyes.).      potassium chloride (K-DUR) 10 MEQ tablet Take 10 mEq by mouth every morning. Take as directed     pravastatin (PRAVACHOL) 80 MG tablet Take 1 tablet (80 mg total) by mouth every evening. 90 tablet 0   No current facility-administered medications for this visit.    ROS:   General:  No weight loss, Fever, chills  HEENT: No recent headaches, no nasal bleeding, Upper visual fields visual changes, no sore throat  Neurologic: No dizziness, blackouts, seizures. No recent symptoms of stroke or mini-  stroke. No recent episodes of slurred speech, or temporary blindness.  Cardiac: No recent episodes of chest pain/pressure, no shortness of breath at rest.  No shortness of breath with exertion.  Denies history of atrial  fibrillation or irregular heartbeat  Vascular: No history of rest pain in feet.  No history of claudication.  No history of non-healing ulcer, No history of DVT   Pulmonary: No home oxygen, no productive cough, no hemoptysis,  No asthma or wheezing  Musculoskeletal:  _0  Arthritis, _1  Low back pain,  [ x] Joint pain  Hematologic:No history of hypercoagulable state.  No history of easy bleeding.  No history of anemia  Gastrointestinal: No hematochezia or melena,  No gastroesophageal reflux, no trouble swallowing  Urinary: _2  chronic Kidney disease, _3  on HD - _4  MWF or _5  TTHS, _6  Burning with urination, _7  Frequent urination, _8  Difficulty urinating;   Skin: No rashes  Psychological: No history of anxiety,  No history of depression   Physical Examination  Vitals:   05/24/21 0859 05/24/21 0902  BP: (!) 148/89 (!) 147/82  Pulse: 98 92  Resp: 18   Temp: 98.2 F (36.8 C)   TempSrc: Temporal   SpO2: 98%   Weight: 191 lb (86.6 kg)   Height: _9  (1.778 m)     Body mass index is 27.41 kg/m.  General:  Alert and oriented, no acute distress HEENT: Normal Neck: No bruit or JVD Pulmonary: Clear to auscultation bilaterally Cardiac: Regular Rate and Rhythm without murmur Gastrointestinal: Soft, non-tender, non-distended, no mass, no scars Skin: No rash Extremity Pulses:   radial,  femoral, not palpable dorsalis pedis, posterior tibial  bilaterally.  Skin warm without ischemic changes. Musculoskeletal: No deformity or edema  Neurologic: Upper and lower extremity motor 5/5 and symmetric  DATA:      Right Carotid Findings:  +----------+--------+--------+--------+------------------+--------+              PSV cm/s EDV cm/s Stenosis Plaque  Description Comments   +----------+--------+--------+--------+------------------+--------+   CCA Prox   86       18                                              +----------+--------+--------+--------+------------------+--------+   CCA Mid    74       14                                              +----------+--------+--------+--------+------------------+--------+   CCA Distal 65       14                heterogenous                  +----------+--------+--------+--------+------------------+--------+   ICA Prox   48       11       1-39%    heterogenous                  +----------+--------+--------+--------+------------------+--------+   ICA Mid    52       19                                              +----------+--------+--------+--------+------------------+--------+   ICA Distal 112      29                                              +----------+--------+--------+--------+------------------+--------+  ECA        104      20                                   tortuous   +----------+--------+--------+--------+------------------+--------+   +----------+--------+-------+----------------+-------------------+              PSV cm/s EDV cms Describe         Arm Pressure (mmHG)   +----------+--------+-------+----------------+-------------------+   Subclavian 83       4       Multiphasic, WNL                       +----------+--------+-------+----------------+-------------------+   +---------+--------+--+--------+--+---------+   Vertebral PSV cm/s 42 EDV cm/s 10 Antegrade   +---------+--------+--+--------+--+---------+       Left Carotid Findings:  +----------+--------+--------+--------+-------------------------+--------+              PSV cm/s EDV cm/s Stenosis Plaque Description        Comments   +----------+--------+--------+--------+-------------------------+--------+   CCA Prox   98       22                                                      +----------+--------+--------+--------+-------------------------+--------+   CCA Mid    96       18                                                     +----------+--------+--------+--------+-------------------------+--------+   CCA Distal 71       17                heterogenous                         +----------+--------+--------+--------+-------------------------+--------+   ICA Prox   83       25       1-39%    heterogenous                         +----------+--------+--------+--------+-------------------------+--------+   ICA Mid    91       25                heterogenous and calcific            +----------+--------+--------+--------+-------------------------+--------+   ICA Distal 85       27                                                     +----------+--------+--------+--------+-------------------------+--------+   ECA        119      17                                                     +----------+--------+--------+--------+-------------------------+--------+   +----------+--------+--------+----------------+-------------------+  PSV cm/s EDV cm/s Describe         Arm Pressure (mmHG)   +----------+--------+--------+----------------+-------------------+   Subclavian 125      5        Multiphasic, WNL                       +----------+--------+--------+----------------+-------------------+   +---------+--------+--+--------+--+---------+   Vertebral PSV cm/s 70 EDV cm/s 19 Antegrade   +---------+--------+--+--------+--+---------+   Summary:  Right Carotid: Velocities in the right ICA are consistent with a 1-39%  stenosis.   Left Carotid: Velocities in the left ICA are consistent with a 1-39%  stenosis.   Vertebrals: Bilateral vertebral arteries demonstrate antegrade flow.    ASSESSMENT/PLAN: History of symptomatic left Carotid stenosis s/p  left CEA (Date: 07/08/20).   He denies recent symptoms of stroke/TIA.  His carotid duplex shows < 39% B ICA.   He will  perform activities as tolerates without restrictions.  F/U in 1 year for repeat carotid surveillance.       Roxy Horseman PA-C [facility] Office: [phone]  MD on call Carlis Abbott

## 2021-05-25 DIAGNOSIS — I10 Essential (primary) hypertension: Secondary | ICD-10-CM

## 2021-05-25 DIAGNOSIS — I4891 Unspecified atrial fibrillation: Secondary | ICD-10-CM | POA: Diagnosis not present

## 2021-05-25 DIAGNOSIS — H409 Unspecified glaucoma: Secondary | ICD-10-CM

## 2021-05-25 DIAGNOSIS — E785 Hyperlipidemia, unspecified: Secondary | ICD-10-CM

## 2021-05-26 ENCOUNTER — Telehealth: Payer: Self-pay | Admitting: Internal Medicine

## 2021-05-26 NOTE — Telephone Encounter (Signed)
Attempted phone call to pt to confirm provider he saw today at [place].  Unable to leave voicemail as mailbox is full.

## 2021-05-26 NOTE — Telephone Encounter (Addendum)
Pt needs to be evaluated by optometrist. He also has hx of afib with stroke 02/2020 so ideally do not want him holding anticoag for an extended period of time. Can hold his dose this evening due to bleeding behind his eye but needs eval soon and cardiologist will need to provide input on any longer anticoag hold.

## 2021-05-26 NOTE — Telephone Encounter (Signed)
Patient calling to speak with Rosann Auerbach about his recent appointment with the [place].

## 2021-05-26 NOTE — Telephone Encounter (Signed)
Spoke with pt who reports he had a visit with the [place] today and was advised he has some "bleeding behind his left eye"  He has been referred for further testing.  Pt states he was diagnosed with this a month ago and was advised today it has "gotten worse."  He was advised by [facility] provider to contact cardiology re: Eliquis. Pt also reports he has recently had to small nose bleeds from his left nostril.  Pt states nose bleed was easily stopped with pressure.  Pt has a history of retinal artery occlusion and endarterectomy for carotid stenosis. Pt advised Dr Caryl Comes is not in the office today but will forward to our PharmD for further advisement.  Pt verbalizes understanding and agrees with current plan.

## 2021-06-01 DIAGNOSIS — H16223 Keratoconjunctivitis sicca, not specified as Sjogren's, bilateral: Secondary | ICD-10-CM | POA: Diagnosis not present

## 2021-06-01 DIAGNOSIS — H34212 Partial retinal artery occlusion, left eye: Secondary | ICD-10-CM | POA: Diagnosis not present

## 2021-06-01 DIAGNOSIS — H35373 Puckering of macula, bilateral: Secondary | ICD-10-CM | POA: Diagnosis not present

## 2021-06-01 DIAGNOSIS — Z961 Presence of intraocular lens: Secondary | ICD-10-CM | POA: Diagnosis not present

## 2021-06-01 DIAGNOSIS — H401112 Primary open-angle glaucoma, right eye, moderate stage: Secondary | ICD-10-CM | POA: Diagnosis not present

## 2021-06-01 DIAGNOSIS — H3562 Retinal hemorrhage, left eye: Secondary | ICD-10-CM | POA: Diagnosis not present

## 2021-06-01 DIAGNOSIS — H0102B Squamous blepharitis left eye, upper and lower eyelids: Secondary | ICD-10-CM | POA: Diagnosis not present

## 2021-06-01 DIAGNOSIS — H5319 Other subjective visual disturbances: Secondary | ICD-10-CM | POA: Diagnosis not present

## 2021-06-01 DIAGNOSIS — H43813 Vitreous degeneration, bilateral: Secondary | ICD-10-CM | POA: Diagnosis not present

## 2021-06-01 DIAGNOSIS — H40052 Ocular hypertension, left eye: Secondary | ICD-10-CM | POA: Diagnosis not present

## 2021-06-01 DIAGNOSIS — H0102A Squamous blepharitis right eye, upper and lower eyelids: Secondary | ICD-10-CM | POA: Diagnosis not present

## 2021-06-03 ENCOUNTER — Encounter (INDEPENDENT_AMBULATORY_CARE_PROVIDER_SITE_OTHER): Payer: Medicare Other | Admitting: Ophthalmology

## 2021-06-03 ENCOUNTER — Other Ambulatory Visit: Payer: Self-pay

## 2021-06-03 DIAGNOSIS — H34232 Retinal artery branch occlusion, left eye: Secondary | ICD-10-CM

## 2021-06-03 DIAGNOSIS — H43813 Vitreous degeneration, bilateral: Secondary | ICD-10-CM

## 2021-06-03 DIAGNOSIS — I10 Essential (primary) hypertension: Secondary | ICD-10-CM

## 2021-06-03 DIAGNOSIS — H35033 Hypertensive retinopathy, bilateral: Secondary | ICD-10-CM | POA: Diagnosis not present

## 2021-06-07 NOTE — Telephone Encounter (Signed)
Saw dr Zigmund Daniel here in [facility]

## 2021-06-13 ENCOUNTER — Emergency Department (HOSPITAL_COMMUNITY): Payer: Medicare Other

## 2021-06-13 ENCOUNTER — Emergency Department (HOSPITAL_COMMUNITY)
Admission: EM | Admit: 2021-06-13 | Discharge: 2021-06-13 | Disposition: A | Discharge status: Home / Self Care | Payer: Medicare Other | Attending: Emergency Medicine | Admitting: Emergency Medicine

## 2021-06-13 ENCOUNTER — Encounter (HOSPITAL_COMMUNITY): Payer: Self-pay

## 2021-06-13 DIAGNOSIS — Z20822 Contact with and (suspected) exposure to covid-19: Secondary | ICD-10-CM | POA: Diagnosis not present

## 2021-06-13 DIAGNOSIS — I2510 Atherosclerotic heart disease of native coronary artery without angina pectoris: Secondary | ICD-10-CM | POA: Insufficient documentation

## 2021-06-13 DIAGNOSIS — Z951 Presence of aortocoronary bypass graft: Secondary | ICD-10-CM | POA: Diagnosis not present

## 2021-06-13 DIAGNOSIS — Z79899 Other long term (current) drug therapy: Secondary | ICD-10-CM | POA: Insufficient documentation

## 2021-06-13 DIAGNOSIS — Z7901 Long term (current) use of anticoagulants: Secondary | ICD-10-CM | POA: Insufficient documentation

## 2021-06-13 DIAGNOSIS — I10 Essential (primary) hypertension: Secondary | ICD-10-CM | POA: Diagnosis not present

## 2021-06-13 DIAGNOSIS — H81399 Other peripheral vertigo, unspecified ear: Secondary | ICD-10-CM | POA: Diagnosis not present

## 2021-06-13 DIAGNOSIS — R42 Dizziness and giddiness: Secondary | ICD-10-CM | POA: Diagnosis not present

## 2021-06-13 DIAGNOSIS — H8110 Benign paroxysmal vertigo, unspecified ear: Secondary | ICD-10-CM | POA: Diagnosis not present

## 2021-06-13 LAB — COMPREHENSIVE METABOLIC PANEL
ALT: 11 U/L (ref 0–44)
AST: 37 U/L (ref 15–41)
Albumin: 4.1 g/dL (ref 3.5–5.0)
Alkaline Phosphatase: 65 U/L (ref 38–126)
Anion gap: 10 (ref 5–15)
BUN: 12 mg/dL (ref 8–23)
CO2: 28 mmol/L (ref 22–32)
Calcium: 9.1 mg/dL (ref 8.9–10.3)
Chloride: 99 mmol/L (ref 98–111)
Creatinine, Ser: 0.91 mg/dL (ref 0.61–1.24)
GFR, Estimated: >60 mL/min (ref >60)
Glucose, Bld: 102 mg/dL — ABNORMAL HIGH (ref 70–99)
Potassium: 4.1 mmol/L (ref 3.5–5.1)
Sodium: 137 mmol/L (ref 135–145)
Total Bilirubin: 1 mg/dL (ref 0.3–1.2)
Total Protein: 7.2 g/dL (ref 6.5–8.1)

## 2021-06-13 LAB — URINALYSIS, ROUTINE W REFLEX MICROSCOPIC
Bilirubin Urine: NEGATIVE
Glucose, UA: NEGATIVE mg/dL
Ketones, ur: NEGATIVE mg/dL
Leukocytes,Ua: NEGATIVE
Nitrite: NEGATIVE
Protein, ur: NEGATIVE mg/dL
Specific Gravity, Urine: 1.01 (ref 1.005–1.030)
pH: 7.5 (ref 5.0–8.0)

## 2021-06-13 LAB — CBC WITH DIFFERENTIAL/PLATELET
Abs Immature Granulocytes: 0.01 10*3/uL (ref 0.00–0.07)
Basophils Absolute: 0 10*3/uL (ref 0.0–0.1)
Basophils Relative: 0 %
Eosinophils Absolute: 0 10*3/uL (ref 0.0–0.5)
Eosinophils Relative: 1 %
HCT: 40.4 % (ref 39.0–52.0)
Hemoglobin: 13.8 g/dL (ref 13.0–17.0)
Immature Granulocytes: 0 %
Lymphocytes Relative: 21 %
Lymphs Abs: 1.2 10*3/uL (ref 0.7–4.0)
MCH: 32.6 pg (ref 26.0–34.0)
MCHC: 34.2 g/dL (ref 30.0–36.0)
MCV: 95.5 fL (ref 80.0–100.0)
Monocytes Absolute: 0.5 10*3/uL (ref 0.1–1.0)
Monocytes Relative: 9 %
Neutro Abs: 3.9 10*3/uL (ref 1.7–7.7)
Neutrophils Relative %: 69 %
Platelets: 216 10*3/uL (ref 150–400)
RBC: 4.23 MIL/uL (ref 4.22–5.81)
RDW: 13.2 % (ref 11.5–15.5)
WBC: 5.7 10*3/uL (ref 4.0–10.5)
nRBC: 0 % (ref 0.0–0.2)

## 2021-06-13 LAB — URINALYSIS, MICROSCOPIC (REFLEX)

## 2021-06-13 LAB — TROPONIN I (HIGH SENSITIVITY): Troponin I (High Sensitivity): 8 ng/L (ref <18)

## 2021-06-13 LAB — RESP PANEL BY RT-PCR (FLU A&B, COVID) ARPGX2
Influenza A by PCR: NEGATIVE
Influenza B by PCR: NEGATIVE
SARS Coronavirus 2 by RT PCR: NEGATIVE

## 2021-06-13 LAB — MAGNESIUM: Magnesium: 2.2 mg/dL (ref 1.7–2.4)

## 2021-06-13 MED ORDER — MECLIZINE HCL 12.5 MG PO TABS: 12.5000 mg | ORAL_TABLET | Freq: Two times a day (BID) | ORAL | 0 refills | Status: DC | PRN

## 2021-06-13 MED ORDER — DIAZEPAM 5 MG/ML IJ SOLN
2.5000 mg | Freq: Once | INTRAMUSCULAR | Status: AC
  Administered 2021-06-13: 2.5 mg via INTRAVENOUS
  Filled 2021-06-13: qty 2

## 2021-06-13 MED ORDER — MECLIZINE HCL 25 MG PO TABS
12.5000 mg | ORAL_TABLET | Freq: Once | ORAL | Status: AC
  Administered 2021-06-13: 12.5 mg via ORAL
  Filled 2021-06-13: qty 1

## 2021-06-13 NOTE — ED Provider Notes (Signed)
[facility] EMERGENCY DEPARTMENT Provider Note   CSN: [id number] Arrival date & time: 06/13/21  1422     History  Chief Complaint  Patient presents with   Dizziness    [name] [name] is a 87 y.o. male.   Dizziness Patient presents for dizziness and hypertension.  Onset of dizziness was yesterday morning.  He describes it as a room spinning sensation.  If he focuses on one thing he will appear that object is slightly drifting to the side.  The symptoms persisted throughout the day yesterday.  When he woke up this morning, they were still present.  He has had a history of dizziness and vertigo in the past.  He states that his current episode is more severe than prior episodes.  He went to a nearby fire station.  Blood pressure taken on scene was in the range of 200/100.  Medical history includes atrial fibrillation (on Eliquis), HTN, HLD, GERD, history of vertigo, CAD (s/p bypass in 2004), arthritis, neuropathy. Patient reports that he has been adherent to his medications.  He does take HCTZ and diltiazem every morning, including this morning.  Typically he takes these around 7:30 AM.  He is not on any other antihypertensive medications.  He has had recent visits to the eye doctor.  Patient has chronic, and does latanoprost eyedrops every night.  He was recently prescribed brimonidine for his left eye due to acute on chronic elevated IOP.  He was using his last week and he noticed that he would experience dizziness after using them.  He stopped using these on Friday.  He was also recently referred to a retinal specialist when they identified retinal hemorrhages on funduscopic exam.  No interventions were taken and patient was told that these are stable and he can follow-up in May.  He has no other recent medical history.  Prior to yesterday, he was in his normal state of health and was not experiencing dizziness.    Home Medications Prior to Admission medications    Medication Sig Start Date End Date Taking? Authorizing Provider  meclizine (ANTIVERT) 12.5 MG tablet Take 1 tablet (12.5 mg total) by mouth 2 (two) times daily as needed for up to 12 doses for dizziness. 06/13/21  Yes Godfrey Pick, MD  acetaminophen (TYLENOL) 325 MG tablet Take 650 mg by mouth once as needed for moderate pain or headache.    [provider]  apixaban (ELIQUIS) 5 MG TABS tablet Take 1 tablet (5 mg total) by mouth 2 (two) times daily. Take evening dose starting 07/09/20 07/09/20   Baglia, Corrina, PA-C  butalbital-acetaminophen-caffeine (FIORICET, ESGIC) 50-325-40 MG per tablet Take 1 tablet by mouth 3 (three) times daily as needed for migraine.     [provider]  diltiazem (CARDIZEM CD) 120 MG 24 hr capsule Take 1 capsule (120 mg) by mouth once daily 10/28/16   Deboraha Sprang, MD  fluticasone [facility] 50 MCG/ACT nasal spray Place 1 spray into both nostrils daily. 09/11/18   Tonia Ghent, MD  hydrochlorothiazide (HYDRODIURIL) 25 MG tablet Take 1 tablet (25 mg total) by mouth daily. 07/27/20   Deboraha Sprang, MD  latanoprost (XALATAN) 0.005 % ophthalmic solution INSTILL 1 DROP IN BOTH EYES AT BEDTIME -  CONTACT PHARMACY WHEN NEW SUPPLY IS NEEDED 07/01/20   [provider]  loratadine (CLARITIN) 10 MG tablet Take 10 mg by mouth daily.    [provider]  melatonin 3 MG TABS tablet Take 3 mg by  mouth at bedtime.    [provider]  Multiple Vitamins-Minerals (MULTIVITAMIN WITH MINERALS) tablet Take 1 tablet by mouth daily.    [provider]  Polyethyl Glycol-Propyl Glycol (SYSTANE ULTRA OP) Apply 1-2 drops to eye once as needed (dry eyes.).     [provider]  potassium chloride (K-DUR) 10 MEQ tablet Take 10 mEq by mouth every morning. Take as directed 05/24/12   Deboraha Sprang, MD  pravastatin (PRAVACHOL) 80 MG tablet Take 1 tablet (80 mg total) by mouth every evening. 01/01/21   Deboraha Sprang, MD      Allergies    Ace  inhibitors, Zocor [simvastatin], Nsaids, Prednisone, Cephalexin, Chocolate, Doxycycline, Gabapentin, Hycodan [hydrocodone bit-homatrop mbr], and Oxycodone    Review of Systems   Review of Systems  Neurological:  Positive for dizziness.   Physical Exam Updated Vital Signs BP (!) 171/101 (BP Location: Right Arm)    Pulse (!) 49    Temp 98.2 F (36.8 C) (Oral)    Resp 16    Ht 5\' 10"  (1.778 m)    Wt 87.1 kg    SpO2 99%    BMI 27.55 kg/m  Physical Exam Vitals and nursing note reviewed.  Constitutional:      General: He is not in acute distress.    Appearance: Normal appearance. He is well-developed and normal weight. He is not ill-appearing, toxic-appearing or diaphoretic.  HENT:     Head: Normocephalic and atraumatic.     Right Ear: Tympanic membrane, ear canal and external ear normal.     Left Ear: Tympanic membrane, ear canal and external ear normal.     Nose: Nose normal.     Mouth/Throat:     Mouth: Mucous membranes are moist.     Pharynx: Oropharynx is clear.  Eyes:     Extraocular Movements: Extraocular movements intact.     Conjunctiva/sclera: Conjunctivae normal.     Comments: Horizontal nystagmus, greater when orienting gaze to the left; no skew deviation  Cardiovascular:     Rate and Rhythm: Normal rate and regular rhythm.     Heart sounds: No murmur heard. Pulmonary:     Effort: Pulmonary effort is normal. No respiratory distress.     Breath sounds: Normal breath sounds.  Abdominal:     Palpations: Abdomen is soft.     Tenderness: There is no abdominal tenderness.  Musculoskeletal:        General: No swelling. Normal range of motion.     Cervical back: Normal range of motion and neck supple.     Right lower leg: No edema.     Left lower leg: No edema.  Skin:    General: Skin is warm and dry.     Capillary Refill: Capillary refill takes less than 2 seconds.     Coloration: Skin is not jaundiced or pale.  Neurological:     General: No focal deficit present.      Mental Status: He is alert and oriented to person, place, and time.     Cranial Nerves: No cranial nerve deficit.     Sensory: No sensory deficit.     Motor: No weakness.     Coordination: Coordination normal.     Gait: Gait normal.  Psychiatric:        Mood and Affect: Mood normal.        Behavior: Behavior normal.        Thought Content: Thought content normal.  Judgment: Judgment normal.    ED Results / Procedures / Treatments   Labs (all labs ordered are listed, but only abnormal results are displayed) Labs Reviewed  URINALYSIS, ROUTINE W REFLEX MICROSCOPIC - Abnormal; Notable for the following components:      Result Value   Hgb urine dipstick SMALL (*)    All other components within normal limits  COMPREHENSIVE METABOLIC PANEL - Abnormal; Notable for the following components:   Glucose, Bld 102 (*)    All other components within normal limits  URINALYSIS, MICROSCOPIC (REFLEX) - Abnormal; Notable for the following components:   Bacteria, UA RARE (*)    All other components within normal limits  RESP PANEL BY RT-PCR (FLU A&B, COVID) ARPGX2  CBC WITH DIFFERENTIAL/PLATELET  MAGNESIUM  TROPONIN I (HIGH SENSITIVITY)    EKG EKG Interpretation  Date/Time:  Sunday June 13 2021 14:29:46 EST Ventricular Rate:  91 PR Interval:    QRS Duration: 107 QT Interval:  398 QTC Calculation: 490 R Axis:   33 Text Interpretation: Atrial fibrillation Borderline repolarization abnormality Borderline prolonged QT interval Confirmed by Godfrey Pick [phone]) on 06/13/2021 2:35:39 PM  Radiology CT Head Wo Contrast  Result Date: 06/13/2021 CLINICAL DATA:  87 year old male with dizziness and syncope/presyncope. EXAM: CT HEAD WITHOUT CONTRAST TECHNIQUE: Contiguous axial images were obtained from the base of the skull through the vertex without intravenous contrast. RADIATION DOSE REDUCTION: This exam was performed according to the departmental dose-optimization program which includes  automated exposure control, adjustment of the mA and/or kV according to patient size and/or use of iterative reconstruction technique. COMPARISON:  03/10/2020 MR and 02/11/2016 CT FINDINGS: Brain: No evidence of acute infarction, hemorrhage, hydrocephalus, extra-axial collection or mass lesion/mass effect. Atrophy and chronic small-vessel white matter ischemic changes are again identified. Vascular: Carotid and vertebral atherosclerotic calcifications are noted. Skull: Normal. Negative for fracture or focal lesion. Sinuses/Orbits: No acute finding. Other: None. IMPRESSION: 1. No evidence of acute intracranial abnormality. 2. Atrophy and chronic small-vessel white matter ischemic changes. Electronically Signed   By: Margarette Canada M.D.   On: 06/13/2021 16:00   DG Chest Portable 1 View  Result Date: 06/13/2021 CLINICAL DATA:  dizzy EXAM: PORTABLE CHEST 1 VIEW COMPARISON:  12/17/2019. FINDINGS: Status post CABG with similar cardiomediastinal silhouette. Median sternotomy. No consolidation. No visible pleural effusions or pneumothorax. No evidence of acute bony abnormality. IMPRESSION: No evidence of acute cardiopulmonary disease. Electronically Signed   By: Margaretha Sheffield M.D.   On: 06/13/2021 15:43    Procedures Procedures    Medications Ordered in ED Medications  diazepam (VALIUM) injection 2.5 mg (2.5 mg Intravenous Given 06/13/21 1623)  meclizine (ANTIVERT) tablet 12.5 mg (12.5 mg Oral Given 06/13/21 1625)    ED Course/ Medical Decision Making/ A&P                           Medical Decision Making Amount and/or Complexity of Data Reviewed Labs: ordered. Radiology: ordered.  Risk Prescription drug management.   This patient presents to the ED for concern of dizziness and hypertension, this involves an extensive number of treatment options, and is a complaint that carries with it a high risk of complications and morbidity.  The differential diagnosis includes CVA, BPPV, laryngitis, Mnire's  disease, metabolic derangements   Co morbidities that complicate the patient evaluation  atrial fibrillation (on Eliquis), HTN, HLD, GERD, history of vertigo, CAD (s/p bypass in 2004), arthritis, neuropathy   Additional history obtained:  Additional  history obtained from EMS External records from outside source obtained and reviewed including EMR   Lab Tests:  I Ordered, and personally interpreted labs.  The pertinent results include: No abnormal findings   Imaging Studies ordered:  I ordered imaging studies including CT head, chest x-ray I independently visualized and interpreted imaging which showed no acute findings I agree with the radiologist interpretation   Cardiac Monitoring:  The patient was maintained on a cardiac monitor.  I personally viewed and interpreted the cardiac monitored which showed an underlying rhythm of: Sinus rhythm   Medicines ordered and prescription drug management:  I ordered medication including Valium and meclizine for symptomatic relief Reevaluation of the patient after these medicines showed that the patient resolved I have reviewed the patients home medicines and have made adjustments as needed   Test Considered:  MRI, deferred due to resolution of symptoms  Problem List / ED Course:  Patient is a 87 year old male who presents for symptoms of dizziness since yesterday.  Due to the persistence of the symptoms, he did present to a fire department.  On scene, he was found to have elevated blood pressure.  At baseline, his blood pressure is in the range of 130/70.  He takes HCTZ and carvedilol every morning, including this morning.  On arrival in the ED, his blood pressure remains elevated.  He does endorse some continued dizziness.  His dizziness is worsened with positional movements.  Dix-Hallpike maneuver was not able to reproduce his symptoms.  He does have horizontal nystagmus only, greater with gaze deviation to the left.  He has no skew  deviation.  Patient underwent work-up for both dizziness as well as his hypertension.  CT of head showed no acute findings.  There was no evidence of endorgan damage on his lab work.  He was given Valium and meclizine for symptomatic relief.  On reassessment, he did report resolution of symptoms.  He was able to ambulate to the bathroom several times without any difficulty.  Given this resolution, do not feel the patient needs any further work-up at this time.  He was encouraged to return to the ED for any recurrence of concerning symptoms.  He was also advised to discuss blood pressure medicines with his primary care doctor.  Patient was discharged in stable condition.   Reevaluation:  After the interventions noted above, I reevaluated the patient and found that they have :resolved   Social Determinants of Health:  Has access to outpatient care.  Receives primary care through the [place].   Dispostion:  After consideration of the diagnostic results and the patients response to treatment, I feel that the patent would benefit from discharge.         Final Clinical Impression(s) / ED Diagnoses Final diagnoses:  Peripheral positional vertigo, unspecified laterality  Hypertension, unspecified type    Rx / DC Orders ED Discharge Orders          Ordered    meclizine (ANTIVERT) 12.5 MG tablet  2 times daily PRN        06/13/21 1850              Godfrey Pick, MD 06/13/21 2222

## 2021-06-13 NOTE — ED Notes (Signed)
Received verbal report from [place] at this time

## 2021-06-13 NOTE — ED Notes (Signed)
Pt told RN that he was diagnosed with retinal hemorrhage on November and was started on a new eye drops last week. Pt states it could be the contributing factor to the elevated bp and dizziness. NIHS is 0. Dr.Dixon is notified of new information. Will continue to monitor.

## 2021-06-13 NOTE — ED Triage Notes (Signed)
Reports dizziness and hypertension. Systolic has been above 389. Takes Hctz. No chest pain, sob or any other discomfort. Alert and oriented x 4.

## 2021-06-14 ENCOUNTER — Telehealth: Payer: Self-pay | Admitting: *Deleted

## 2021-06-14 NOTE — Telephone Encounter (Signed)
PLEASE NOTE: All timestamps contained within this report are represented as [place] Standard Time. CONFIDENTIALTY NOTICE: This fax transmission is intended only for the addressee. It contains information that is legally privileged, confidential or otherwise protected from use or disclosure. If you are not the intended recipient, you are strictly prohibited from reviewing, disclosing, copying using or disseminating any of this information or taking any action in reliance on or regarding this information. If you have received this fax in error, please notify us immediately by telephone so that we can arrange for its return to [place]. Phone: [phone], Toll-Free: [phone], Fax: [phone] Page: 1 of 2 Call Id: [id number] [place] RECORD AccessNurse Patient Name: [name] [name] [facility] Gender: Male DOB: 22-Mar-1935 Age: 87 Y 40 M 30 D Return Phone Number: [phone] (Primary) Address: City/ State/ Zip: [place]  [address] Client [place] Night - Client Client Site [place] - Night Provider Renford Dills - MD Contact Type Call Who Is Calling Patient / Member / Family / Caregiver Call Type Triage / Clinical Relationship To Patient Self Return Phone Number [phone] (Primary) Chief Complaint Double Vision Reason for Call Request to Speak to a Physician Initial Comment Caller states he is having dizzy spells and blurred vision off and on. [place] [facility] Translation No Nurse Assessment Nurse: Jearld Pies, RN, Lovena Le Date/Time Eilene Ghazi Time): 06/13/2021 10:59:31 AM Confirm and document reason for call. If symptomatic, describe symptoms. ---Caller states he is having dizzy spells and blurred vision off and on. 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 Has surgery to repair. Dizziness started yesterday and has continued. Blurred vision and double vision today. BP is  good today per pt. Denies fever, sob, chest pain, or any other symptoms at this time. Does the patient have any new or worsening symptoms? ---Yes Will a triage be completed? ---Yes Related visit to physician within the last 2 weeks? ---No Does the PT have any chronic conditions? (i.e. diabetes, asthma, this includes High risk factors for pregnancy, etc.) ---Yes List chronic conditions. ---A Fib Is this a behavioral health or substance abuse call? ---No Guidelines Guideline Title Affirmed Question Affirmed Notes Nurse Date/Time Eilene Ghazi Time) Vision Loss or Change Double vision Jake Bathe 06/13/2021 11:01:41 AM Disp. Time Eilene Ghazi Time) Disposition Final User 06/13/2021 11:05:51 AM Go to ED Now (or PCP triage) Yes Jearld Pies, RN, Lovena Le PLEASE NOTE: All timestamps contained within this report are represented as [place] Standard Time. CONFIDENTIALTY NOTICE: This fax transmission is intended only for the addressee. It contains information that is legally privileged, confidential or otherwise protected from use or disclosure. If you are not the intended recipient, you are strictly prohibited from reviewing, disclosing, copying using or disseminating any of this information or taking any action in reliance on or regarding this information. If you have received this fax in error, please notify us immediately by telephone so that we can arrange for its return to [place]. Phone: [phone], Toll-Free: [phone], Fax: [phone] Page: 2 of 2 Call Id: [id number] [place] Disagree/Comply Comply Caller Understands Yes PreDisposition Did not know what to do Care Advice Given Per Guideline GO TO ED NOW (OR PCP TRIAGE): * IF NO PCP (PRIMARY CARE PROVIDER) SECOND-LEVEL TRIAGE: You need to be seen within the next hour. Go to the [place] at _____________ [place] as soon as you can. Referrals GO TO [place]  [facility] - ED

## 2021-06-14 NOTE — Telephone Encounter (Signed)
Patient went to [facility] 06/13/21.

## 2021-06-16 DIAGNOSIS — R42 Dizziness and giddiness: Secondary | ICD-10-CM | POA: Diagnosis not present

## 2021-06-16 DIAGNOSIS — H903 Sensorineural hearing loss, bilateral: Secondary | ICD-10-CM | POA: Diagnosis not present

## 2021-06-16 DIAGNOSIS — H838X3 Other specified diseases of inner ear, bilateral: Secondary | ICD-10-CM | POA: Diagnosis not present

## 2021-06-17 NOTE — Telephone Encounter (Signed)
Spoke with patient and advised on below. Patient verbalized understanding and will keep appts as scheduled.

## 2021-06-17 NOTE — Telephone Encounter (Signed)
LMTCB

## 2021-06-17 NOTE — Telephone Encounter (Signed)
Please get update on patient.  Thanks. 

## 2021-06-17 NOTE — Telephone Encounter (Addendum)
Noted, will see at [place].  I need him to check with ENT about options if not any better.  Thanks.

## 2021-06-17 NOTE — Telephone Encounter (Signed)
Spoke with patient and he was able to see the ENT; Dr. Benjamine Mola yesterday. He thinks that he possibly has something going on with his inner ear and has to go back on 06/28/21 for some further testing. He has hospital f/u here on 06/22/21. Patient states he is not doing in better; he is taking meclizine but that is no help and even Dr. Benjamine Mola told patient that was not going to help either but patient is still taking it.

## 2021-06-22 ENCOUNTER — Other Ambulatory Visit: Payer: Self-pay

## 2021-06-22 ENCOUNTER — Encounter: Payer: Self-pay | Admitting: Family Medicine

## 2021-06-22 ENCOUNTER — Ambulatory Visit (INDEPENDENT_AMBULATORY_CARE_PROVIDER_SITE_OTHER): Payer: Medicare Other | Admitting: Family Medicine

## 2021-06-22 DIAGNOSIS — R42 Dizziness and giddiness: Secondary | ICD-10-CM

## 2021-06-22 DIAGNOSIS — I6522 Occlusion and stenosis of left carotid artery: Secondary | ICD-10-CM

## 2021-06-22 NOTE — Patient Instructions (Signed)
We will update Dr. Benjamine Mola in the meantime.  I want you to keep the follow up with him in the meantime. You may need extra imaging prior to his appointment but I want to check with him first.  Take care.  Glad to see you.

## 2021-06-22 NOTE — Progress Notes (Signed)
This visit occurred during the SARS-CoV-2 public health emergency.  Safety protocols were in place, including screening questions prior to the visit, additional usage of staff PPE, and extensive cleaning of exam room while observing appropriate contact time as indicated for disinfecting solutions.  06/12/21.  Woke up with vertigo, went on all day and into the next day.  He had blurry vision and double vision.  Sx continued and went to ER.    CT with IMPRESSION: 1. No evidence of acute intracranial abnormality. 2. Atrophy and chronic small-vessel white matter ischemic changes.   Still with vertigo sx ongoing.  It may be a little less pronounced at the end of the day.  He notes escalating/reproducible sx with head turning to the L.  He still has sx most of the time but not 100% of the time.    He has double vision with distance vision but not near vision.  Vision improves with tilting his head forward (ie forehead down).  This is not an acute change.  He wears hearing aids at baseline but lately he noted L ear feeling plugged and muffled, with a static sound.    He had eye clinic re: retinal hemorrhages with f/u done with Dr. Zigmund Daniel this month.  He'll have f/u this May.    He saw Dr. Benjamine Mola after the ER eval at the ENT clinic.  He has f/u pending.   He still feels unsteady.  His son drove him to clinic today.  Routine cautions d/w pt.   He had mild nosebleeds noted a few times in the last month.    Meds, vitals, and allergies reviewed.   ROS: Per HPI unless specifically indicated in ROS section   GEN: nad, alert and oriented HEENT: ncat, PERRL, TM and canals WNL B NECK: supple w/o LA CV: IRR not tachy PULM: ctab, no inc wob ABD: soft, +bs EXT: no edema SKIN: no acute rash S/S grossly wnl x4 with CN2-12 intact B

## 2021-06-23 ENCOUNTER — Telehealth: Payer: Self-pay | Admitting: Family Medicine

## 2021-06-23 NOTE — Assessment & Plan Note (Addendum)
He has had reproducible symptoms with turning his head quickly to the left.  He does not have symptoms at time of the exam, but the exam was in the latter portion of the day and that is usually when he does not have his worst symptoms.  He has had decreased hearing in the left ear without cerumen impaction.  He does wear hearing aids at baseline.  He is also had vision changes as described above (with planned eye clinic follow-up).  He is going to follow-up with Dr. Benjamine Mola soon and I called his clinic about options in the meantime.  I talked with Dr. Benjamine Mola and he very graciously took my call.  We talked about the situation and given the likely low yield of follow-up imaging at this point (given his recent CT along with history of MRI in 2021), the follow-up with Dr. Deeann Saint office is likely the most important testing at this point.  We both agreed that it was reasonable to defer MRI at this point and get the follow-up testing done with ENT. ? ? ?30 minutes were devoted to patient care in this encounter (this includes time spent reviewing the patient's file/history, interviewing and examining the patient, counseling/reviewing plan with patient).  ?

## 2021-06-23 NOTE — Telephone Encounter (Signed)
Please call pt.   Dr. Benjamine Mola very graciously took my call.  Long story short, would defer MRI at this point.  ? ?We talked about the situation and given the likely low yield of follow-up imaging at this point (given his recent CT along with history of MRI in 2021), the follow-up with Dr. Deeann Saint office is likely the most important testing at this point.  We both agreed that it was reasonable to defer MRI at this point and get the follow-up testing done with ENT.  I will await his follow up note from ENT and we'll go from there.  Thanks.  ?

## 2021-06-23 NOTE — Telephone Encounter (Signed)
Patient notified about message from Dr. Damita Dunnings and decisions made with Dr. Benjamine Mola. Patient verbalized understanding and will see Dr. Benjamine Mola for his f/u on Monday.  ?

## 2021-06-28 DIAGNOSIS — R42 Dizziness and giddiness: Secondary | ICD-10-CM | POA: Diagnosis not present

## 2021-07-06 DIAGNOSIS — H832X3 Labyrinthine dysfunction, bilateral: Secondary | ICD-10-CM | POA: Diagnosis not present

## 2021-07-06 DIAGNOSIS — H903 Sensorineural hearing loss, bilateral: Secondary | ICD-10-CM | POA: Diagnosis not present

## 2021-07-06 DIAGNOSIS — R42 Dizziness and giddiness: Secondary | ICD-10-CM | POA: Diagnosis not present

## 2021-07-07 DIAGNOSIS — H5319 Other subjective visual disturbances: Secondary | ICD-10-CM | POA: Diagnosis not present

## 2021-07-07 DIAGNOSIS — H532 Diplopia: Secondary | ICD-10-CM | POA: Diagnosis not present

## 2021-07-07 DIAGNOSIS — H3562 Retinal hemorrhage, left eye: Secondary | ICD-10-CM | POA: Diagnosis not present

## 2021-07-07 DIAGNOSIS — H16223 Keratoconjunctivitis sicca, not specified as Sjogren's, bilateral: Secondary | ICD-10-CM | POA: Diagnosis not present

## 2021-07-07 DIAGNOSIS — Z961 Presence of intraocular lens: Secondary | ICD-10-CM | POA: Diagnosis not present

## 2021-07-07 DIAGNOSIS — H0102B Squamous blepharitis left eye, upper and lower eyelids: Secondary | ICD-10-CM | POA: Diagnosis not present

## 2021-07-07 DIAGNOSIS — H43813 Vitreous degeneration, bilateral: Secondary | ICD-10-CM | POA: Diagnosis not present

## 2021-07-07 DIAGNOSIS — H35373 Puckering of macula, bilateral: Secondary | ICD-10-CM | POA: Diagnosis not present

## 2021-07-07 DIAGNOSIS — H34212 Partial retinal artery occlusion, left eye: Secondary | ICD-10-CM | POA: Diagnosis not present

## 2021-07-07 DIAGNOSIS — H40052 Ocular hypertension, left eye: Secondary | ICD-10-CM | POA: Diagnosis not present

## 2021-07-07 DIAGNOSIS — H401112 Primary open-angle glaucoma, right eye, moderate stage: Secondary | ICD-10-CM | POA: Diagnosis not present

## 2021-07-07 DIAGNOSIS — H0102A Squamous blepharitis right eye, upper and lower eyelids: Secondary | ICD-10-CM | POA: Diagnosis not present

## 2021-07-07 LAB — HM DIABETES EYE EXAM

## 2021-07-08 ENCOUNTER — Other Ambulatory Visit: Payer: Self-pay | Admitting: Optometry

## 2021-07-08 ENCOUNTER — Other Ambulatory Visit (HOSPITAL_COMMUNITY): Payer: Self-pay | Admitting: Optometry

## 2021-07-08 ENCOUNTER — Encounter: Payer: Self-pay | Admitting: Family Medicine

## 2021-07-11 ENCOUNTER — Ambulatory Visit (HOSPITAL_COMMUNITY): Payer: Medicare Other

## 2021-07-12 NOTE — Telephone Encounter (Signed)
I dont even see any MRI scheduled for this patient.  ? ?This is not an order that we placed or schedule for this patient.  ?Per Epic this was ordered by [facility] office and was scheduled by their office as well?  If the patient is already scheduled somewhere then he needs to contact that location for cancellations or Dr Zenia Resides office.  ?They need to be the ones handling any concerns with appointment scheduling/conflicts, not our office.  ? ? ?

## 2021-07-13 NOTE — Telephone Encounter (Signed)
Dr. Damita Dunnings was aware of this but asked that this still be done for this patient. This was by his request. ?

## 2021-07-13 NOTE — Telephone Encounter (Signed)
I contacted [facility] they cannot move him up any sooner.  ? ?I also reached out to [facility] imaging and asked about their open MRI availability and they are scheduled out for the next 2 weeks.  ? ?Dr. Damita Dunnings made aware, as this is not an urgent/emergent or STAT issue, we will need to wait until Monday for [facility].  ? ?Patient aware and thanks me for the assistance.  He states he has not had any worsening symptoms.  ? ? ?

## 2021-07-14 NOTE — Telephone Encounter (Signed)
Noted.  Thanks for checking on options here. ?

## 2021-07-19 ENCOUNTER — Encounter: Payer: Self-pay | Admitting: Family Medicine

## 2021-07-19 DIAGNOSIS — R9089 Other abnormal findings on diagnostic imaging of central nervous system: Secondary | ICD-10-CM | POA: Diagnosis not present

## 2021-07-19 DIAGNOSIS — R42 Dizziness and giddiness: Secondary | ICD-10-CM | POA: Diagnosis not present

## 2021-07-19 DIAGNOSIS — H532 Diplopia: Secondary | ICD-10-CM | POA: Diagnosis not present

## 2021-07-19 DIAGNOSIS — H538 Other visual disturbances: Secondary | ICD-10-CM | POA: Diagnosis not present

## 2021-07-21 ENCOUNTER — Ambulatory Visit (INDEPENDENT_AMBULATORY_CARE_PROVIDER_SITE_OTHER): Payer: Medicare Other | Admitting: Neurology

## 2021-07-21 ENCOUNTER — Telehealth: Payer: Self-pay | Admitting: Family Medicine

## 2021-07-21 ENCOUNTER — Encounter: Payer: Self-pay | Admitting: Neurology

## 2021-07-21 VITALS — BP 125/77 | HR 90 | Ht 70.0 in | Wt 194.0 lb

## 2021-07-21 DIAGNOSIS — C8589 Other specified types of non-Hodgkin lymphoma, extranodal and solid organ sites: Secondary | ICD-10-CM

## 2021-07-21 DIAGNOSIS — G0490 Encephalitis and encephalomyelitis, unspecified: Secondary | ICD-10-CM | POA: Diagnosis not present

## 2021-07-21 DIAGNOSIS — C8591 Non-Hodgkin lymphoma, unspecified, lymph nodes of head, face, and neck: Secondary | ICD-10-CM | POA: Diagnosis not present

## 2021-07-21 DIAGNOSIS — I639 Cerebral infarction, unspecified: Secondary | ICD-10-CM

## 2021-07-21 DIAGNOSIS — C8598 Non-Hodgkin lymphoma, unspecified, lymph nodes of multiple sites: Secondary | ICD-10-CM | POA: Diagnosis not present

## 2021-07-21 NOTE — Progress Notes (Signed)
?[facility] ? ? ? ?Provider:  Dr Jaynee Eagles ?Requesting Provider: Tonia Ghent, MD ?Primary Care Provider:  Tonia Ghent, MD ? 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 ?HPI:  [name] [name] is a 87 y.o. male here as requested by Tonia Ghent, MD for possible CNS lymphoma.  He is a referral from Dr. [name].  Dr. [name] seeing him for skew deviation.  There was an occipital stroke noted.  However also in the MRI showed yet impression read that impression right increased signal intensity on the diffusion weighted imaging in the posterior horn of the right lateral ventricle, some of these areas demonstrate peripheral curvilinear enhancement, this is nonspecific and differential diagnosis includes primary CNS lymphoma versus secondary lymphoma versus metastatic disease versus postinfectious process versus less likely inflammatory process such as sarcoid, demyelinating disease or sarcoidosis.  Recommend lumbar puncture.  I spent a long time discussing this with patient and wife, wife appears to have Parkinson's disease and possibly Parkinson's disease dementia I did not get the feeling as she understood, I tried to explain to them what we do not know what this is a we need more testing, they could not understand why I did not know what it was, I tried to go through the entire differential with them.  At this time organ to do an extensive work-up. ? ?Reviewed notes, labs and imaging from outside physicians, which showed: see above ? ?Review of Systems: ?Patient complains of symptoms per HPI as well as the following symptoms diplopia. Pertinent negatives and positives per HPI. All others negative. ? ? ?Social History  ? ?Socioeconomic History  ? Marital status: Married  ?  Spouse name: Not on file  ? Number of children: 3  ? Years of education: Not on file  ? Highest education level: Not on file  ?Occupational History  ? Occupation: Retired  ?Tobacco Use  ? Smoking status: Former  ?  Packs/day: 1.00  ?   Years: 25.00  ?  Pack years: 25.00  ?  Types: Cigarettes  ?  Quit date: 04/25/1980  ?  Years since quitting: 41.2  ? Smokeless tobacco: Never  ?Vaping Use  ? Vaping Use: Never used  ?Substance and Sexual Activity  ? Alcohol use: No  ?  Alcohol/week: 0.0 standard drinks  ? Drug use: No  ? Sexual activity: Not on file  ?Other Topics Concern  ? Not on file  ?Social History Narrative  ? [employer] '56-'58, overseas to [place]  ? Some care through [place]  ? No service disability  ? ?Social Determinants of Health  ? ?Financial Resource Strain: Low Risk   ? Difficulty of Paying Living Expenses: Not hard at all  ?Food Insecurity: No Food Insecurity  ? Worried About Charity fundraiser in the Last Year: Never true  ? Ran Out of Food in the Last Year: Never true  ?Transportation Needs: No Transportation Needs  ? Lack of Transportation (Medical): No  ? Lack of Transportation (Non-Medical): No  ?Physical Activity: Inactive  ? Days of Exercise per Week: 0 days  ? Minutes of Exercise per Session: 0 min  ?Stress: No Stress Concern Present  ? Feeling of Stress : Not at all  ?Social Connections: Not on file  ?Intimate Partner Violence: Not At Risk  ? Fear of Current or Ex-Partner: No  ? Emotionally Abused: No  ? Physically Abused: No  ? Sexually Abused: No  ? ? ?Family History  ?Problem Relation Age of Onset  ?  Cancer Mother   ? Stroke Neg Hx   ? ? ?Past Medical History:  ?Diagnosis Date  ? Anticoagulated on Coumadin   ? followed in [facility] at [facility]  ? Aortic stenosis   ? a. Echo 2/15: Severe LVH, EF 55-60%, Gr 1 DD, mild to mod AS (mean 20 mmHg, peak 31 mmHg), AVA 1.4-1.5 cm2, mild AI, MAC, trivial MR, LA upper limits of normal, RVSP 22 mmHg, mild RAE;   b. Echo 2/16:  EF 55-60%, no RWMA, mild AS (mean 17 mmHg, peak 27 mmHg), AVA 1.5 cm2, mild AI, MAC, mild LAE  ? Arthritis   ? Bladder neoplasm   ? Chronic cough   ? NO CARDIAC OR PULMONARY RELATED  ? Complication of anesthesia   ? Coronary artery disease CARDIOLOGIST-  DR KLEIN/ ALLRED   ? a. s/p CABG 2004, b. LHC with patent grafts 07/2005, ejection fraction 50%;  c. Myoview 2/15: no ischemia, EF 61%  ? Dry eyes   ? Dyslipidemia   ? GERD (gastroesophageal reflux disease)   ? HTN (hypertension)   ? Mild aortic stenosis   ? PAF (paroxysmal atrial fibrillation) ([place])   ? CHADS2-VASc:  4  ? Paroxysmal atrial fibrillation (HCC)   ? PONV (postoperative nausea and vomiting)   ? From having surgery back in the 1950's  ? 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 a. Amiodarone Rx  ? S/P CABG x 5 2004  ? Stroke ([place]) 03/10/2020  ? Stroke in the Left eye  ? ? ?Patient Active Problem List  ? Diagnosis Date Noted  ? Advance care planning 10/28/2020  ? Right knee pain 09/21/2020  ? Traumatic leg ulcer ([place]) 08/14/2020  ? Bilateral pseudophakia 07/27/2020  ? Carpal tunnel syndrome 07/27/2020  ? Dermatochalasis 07/27/2020  ? Headache 07/27/2020  ? Hematuria 07/27/2020  ? Insomnia 07/27/2020  ? Postsurgical aortocoronary bypass status 07/27/2020  ? Pure hypercholesterolemia 07/27/2020  ? Radiculopathy 07/27/2020  ? Vitamin D deficiency 07/27/2020  ? Stenosis of internal carotid artery with cerebral infarction, right ([place]) 07/08/2020  ? Glaucoma (increased eye pressure) 06/19/2020  ? Stress reaction 06/19/2020  ? Imbalance 03/27/2020  ? Lightheadedness 03/27/2020  ? Retinal artery occlusion 03/10/2020  ? Constipation 12/22/2019  ? Right foot pain 09/11/2019  ? Leg pain 06/23/2019  ? Muscle pain 04/04/2019  ? Laryngopharyngeal reflux (LPR) 05/22/2018  ? Rhinitis sicca 05/22/2018  ? Sciatica 12/28/2017  ? Neuropathy 10/07/2017  ? Foot swelling 10/07/2017  ? Shoulder pain 03/29/2017  ? CAD (coronary artery disease), autologous vein bypass graft 10/31/2016  ? Family history of colon cancer 10/31/2016  ? History of adenomatous polyp of colon 10/31/2016  ? Osteoarthritis 10/31/2016  ? Atrial fibrillation ([place]) 10/31/2016  ? Syncope 02/11/2016  ? High risk medication use   ? Skin lesion  06/04/2015  ? Lower back pain 05/01/2015  ? Elevated TSH 12/26/2014  ? Back pain 01/22/2014  ? Aortic stenosis 05/16/2013  ? Bladder neoplasm 11/23/2012  ? BCC (basal cell carcinoma of skin) 08/29/2012  ? Vertigo 08/24/2012  ? GERD (gastroesophageal reflux disease) 05/01/2012  ? Hyperlipidemia 03/14/2012  ? TMJ pain dysfunction syndrome 01/20/2012  ? Long term (current) use of anticoagulants 08/31/2011  ? RVOT ventricular tachycardia ([place])   ? Cough 04/29/2009  ? HEARING LOSS, SUDDEN 12/03/2007  ? Essential hypertension 02/22/2007  ? PAF (paroxysmal atrial fibrillation) ([place]) 02/22/2007  ? ? ?Past Surgical History:  ?Procedure Laterality Date  ? CARDIAC CATHETERIZATION  07-26-2005  DR GAMBLE  ?  PRESERVED LVF/  EF 50%/ PATENT GRAFTS  ? CARDIAC CATHETERIZATION  03-04-2003  DR GAMBLE  ? SEVERE 3 VESSEL DISEASE  ? [place]  ? PAF/ FALSE POSITIVE STRESS TEST  ? CAROTID ENDARTERECTOMY Left 2022  ? CATARACT EXTRACTION W/ INTRAOCULAR LENS IMPLANT Right   ? CORONARY ARTERY BYPASS GRAFT  03-08-2003  DR RPRXYVOP  ? LIMA TO LAD/ SVG TO OM2/  SVG TO DIAGONAL / SVG TO PDA & PLA  ? CYSTOSCOPY WITH BIOPSY N/A 12/25/2012  ? Procedure: CYSTOSCOPY WITH BLADDER BIOPSY;  Surgeon: Fredricka Bonine, MD;  Location[facility];  Service: Urology;  Laterality: N/A;  ? ELECTROPHYSIOLOGY STUDY  07-27-2005  DR Cristopher Peru  ? MAPPING --   RESULT NONINDUCIBLE VT OR SVT/  DX RIGHT VENTRICULAR OUTFLOW TRACT VT AND RULES OUT MORE MALIGNANT CAUSES OF VT  ? ENDARTERECTOMY Left 07/08/2020  ? Procedure: LEFT CAROTID ENDARTERECTOMY;  Surgeon: Serafina Mitchell, MD;  Location: [place];  Service: Vascular;  Laterality: Left;  ? [place]  ? KNEE ARTHROSCOPY Right 1994  ? [place] SURGERY  1999  ? L4 -- L5  ? MOHS SURGERY    ? by Dr. Levada Dy 2019  ? REMOVAL VOCAL CORD POLYPS  1978  ? TONSILLECTOMY  AS CHILD  ? TRANSTHORACIC ECHOCARDIOGRAM  08/08/2011  ? MILD LVH/   EF 92-92%/  GRADE I DIASTOLIC DYSFUNCTION/ MILD AV STENOSIS  ? ? ?Current Outpatient Medications  ?Medication Sig Dispense Refill  ? acetaminophen (TYLENOL) 325 MG tablet Take 650 mg by mouth once as needed fo

## 2021-07-21 NOTE — Telephone Encounter (Signed)
Noted.  Thanks.  I will check that when I get back to the office. ?

## 2021-07-21 NOTE — Telephone Encounter (Signed)
Please request a copy of patient's MRI from Dr. Zenia Resides office.  Thanks.  I sent patient a MyChart message about this. ?

## 2021-07-21 NOTE — Patient Instructions (Addendum)
Lumbar puncture ?CT Scan of the chest, abdomen and pelvis ?CT of the blood vessels of the head.  ?Blood work today ? ?Lumbar Puncture ?A lumbar puncture, also called a spinal tap, is a procedure that is done to remove a small amount of the fluid that surrounds the brain and spinal cord (cerebrospinal fluid, CSF). The fluid is then examined in the lab. This procedure may be done to: ?Help diagnose various problems, such as meningitis, encephalitis, multiple sclerosis, and other infections. ?Remove fluid and relieve pressure that occurs with certain types of headaches. ?Look for bleeding within the brain and spinal cord areas (central nervous system). ?Place medicine into the spinal fluid. ?Tell a health care provider about: ?Any allergies you have. ?All medicines you are taking, including vitamins, herbs, eye drops, creams, and over-the-counter medicines like aspirin or NSAIDs. ?Any problems you or family members have had with anesthetic medicines. ?Any blood disorders you have. ?Any surgeries you have had. ?Any medical conditions you have. ?Whether you are pregnant or may be pregnant. ?What are the risks? ?Generally, this is a safe procedure. However, problems may occur, including: ?Infection at the insertion site that can spread to the bone or spinal fluid. ?Bleeding. ?Leakage of CSF. ?Spinal headache. This is a severe headache that occurs when there is a leak of spinal fluid. ?Allergic reactions to medicines or dyes. ?Damage to other structures or organs. ?What happens before the procedure? ?Staying hydrated ?Follow instructions from your health care provider about hydration, which may include: ?Up to 2 hours before the procedure - you may continue to drink clear liquids, such as water, clear fruit juice, black coffee, and plain tea. ?Eating and drinking restrictions ?Follow instructions from your health care provider about eating and drinking. If you will be given a medicine to help you relax (sedative), these  instructions may include: ?8 hours before the procedure - stop eating heavy meals or foods such as meat, fried foods, or fatty foods. ?6 hours before the procedure - stop eating light meals or foods, such as toast or cereal. ?6 hours before the procedure - stop drinking milk or drinks that contain milk. ?2 hours before the procedure - stop drinking clear liquids. ?Medicines ?Ask your health care provider about: ?Changing or stopping your regular medicines. This is especially important if you are taking diabetes medicines or blood thinners. ?Taking medicines such as aspirin and ibuprofen. These medicines can thin your blood. Do not take these medicines before your procedure if your health care provider instructs you not to. ?You may be given antibiotic medicine to help prevent infection. If so, take the antibiotic as told by your health care provider. ?General instructions ?You may have a blood sample taken. ?You may be asked to shower with a germ-killing soap. ?Ask your health care provider how your surgical site will be marked or identified. ?Plan to have someone take you home from the hospital or clinic. This is especially important if you will be given a sedative. ?If you will be going home right after the procedure, plan to have someone with you for 24 hours. ?What happens during the procedure? ? ?You may lie down on your side with your knees bent, or you may sit with your head resting on a pillow on your lap. ?How you are positioned depends on your age and size. ?You will be positioned so that the spaces between the bones of the spine (vertebrae) are as wide as possible. This will make it easier to pass the needle  into the spinal canal. ?The skin on your lower back (lumbar region) will be cleaned. ?You will be given an injection of medicine to numb your lower back area (local anesthetic). ?You may be given pain medicine or a sedative. ?A small needle will be inserted into your lower back until it enters the  space that contains the cerebrospinal fluid. The needle will not enter the spinal cord. ?Fluid will be collected into tubes. It will be sent to a lab for examination. ?The needle will be withdrawn, and a bandage (dressing) will be placed over the area. ?The procedure may vary among health care providers and hospitals. ?What happens after the procedure? ?Your blood pressure, heart rate, breathing rate, and blood oxygen level will be monitored until any medicines you were given have worn off. ?You may need to stay lying down for a while. ?You will need to drink plenty of fluids and caffeine to help prevent a headache. ?Do not drive for 24 hours if you received a sedative. ?Summary ?A lumbar puncture, also called a spinal tap, is a procedure that is done to remove a small amount of the cerebrospinal fluid, CSF. This may be done to help diagnose a wide variety of conditions. ?Before the procedure, tell your health care provider about all medicines you are taking, including vitamins, herbs, eye drops, creams, and over-the-counter medicines. ?Before the procedure, ask your health care provider about changing or stopping your regular medicines. This is especially important if you are taking blood thinners. ?Your lower back will be numbed with an injection before the needle is placed into your spinal canal. ?After the procedure, you will lie down for a while and you will drink plenty of fluids. ?This information is not intended to replace advice given to you by your health care provider. Make sure you discuss any questions you have with your health care provider. ?Document Revised: 07/23/2020 Document Reviewed: 02/20/2020 ?Elsevier Patient Education ? [place]. ? ?

## 2021-07-21 NOTE — Telephone Encounter (Signed)
Results came in today and are in your inbox. ?

## 2021-07-23 ENCOUNTER — Encounter: Payer: Self-pay | Admitting: Family Medicine

## 2021-07-24 LAB — ANA COMPREHENSIVE PANEL
Anti JO-1: <0.2 AI (ref 0.0–0.9)
Centromere Ab Screen: 0.3 AI (ref 0.0–0.9)
Chromatin Ab SerPl-aCnc: <0.2 AI (ref 0.0–0.9)
ENA RNP Ab: 0.2 AI (ref 0.0–0.9)
ENA SM Ab Ser-aCnc: <0.2 AI (ref 0.0–0.9)
ENA SSA (RO) Ab: <0.2 AI (ref 0.0–0.9)
ENA SSB (LA) Ab: <0.2 AI (ref 0.0–0.9)
Scleroderma (Scl-70) (ENA) Antibody, IgG: <0.2 AI (ref 0.0–0.9)
dsDNA Ab: <1 IU/mL (ref 0–9)

## 2021-07-24 LAB — CD19 AND CD20, FLOW CYTOMETRY

## 2021-07-24 LAB — SEDIMENTATION RATE: Sed Rate: 24 mm/hr (ref 0–30)

## 2021-07-24 LAB — C-REACTIVE PROTEIN: CRP: <1 mg/L (ref 0–10)

## 2021-07-24 LAB — CMV ABS, IGG+IGM (CYTOMEGALOVIRUS)
CMV Ab - IgG: 6.1 U/mL — ABNORMAL HIGH (ref 0.00–0.59)
CMV IgM Ser EIA-aCnc: <30 AU/mL (ref 0.0–29.9)

## 2021-07-26 ENCOUNTER — Telehealth: Payer: Self-pay | Admitting: Neurology

## 2021-07-26 ENCOUNTER — Telehealth: Payer: Self-pay | Admitting: *Deleted

## 2021-07-26 NOTE — Telephone Encounter (Signed)
Pt called asking to speak with Dr. Damita Dunnings today if possible, pt said that he received PCP's mychart and wanted to talk to him today if possible before his procedure tomorrow. He said they have been communicating via mychart and pt wanted to f/u with PCP directly before his procedure. ? ?Dr. Damita Dunnings please call pt at [phone] ?

## 2021-07-26 NOTE — Telephone Encounter (Signed)
Medicare/aarp no Josem Kaufmann req order sent to [facility], sent a message to [place] to inform them to schedule this as soon as possible.  ?

## 2021-07-26 NOTE — Telephone Encounter (Signed)
Also, sent [facility] with [facility] a message to schedule the LP.  ?

## 2021-07-27 ENCOUNTER — Ambulatory Visit
Admission: RE | Admit: 2021-07-27 | Discharge: 2021-07-27 | Disposition: A | Discharge status: Home / Self Care | Payer: Medicare Other | Source: Ambulatory Visit | Attending: Neurology | Admitting: Neurology

## 2021-07-27 ENCOUNTER — Other Ambulatory Visit (HOSPITAL_COMMUNITY)
Admission: RE | Admit: 2021-07-27 | Discharge: 2021-07-27 | Disposition: A | Discharge status: Home / Self Care | Payer: Medicare Other | Source: Ambulatory Visit | Attending: Neurology | Admitting: Neurology

## 2021-07-27 VITALS — BP 129/93 | HR 79

## 2021-07-27 DIAGNOSIS — C8589 Other specified types of non-Hodgkin lymphoma, extranodal and solid organ sites: Secondary | ICD-10-CM | POA: Diagnosis not present

## 2021-07-27 DIAGNOSIS — R836 Abnormal cytological findings in cerebrospinal fluid: Secondary | ICD-10-CM | POA: Diagnosis not present

## 2021-07-27 NOTE — Discharge Instructions (Signed)

## 2021-07-27 NOTE — Progress Notes (Signed)
1 vial of blood drawn from pts LAC to be sent off with LP lab work. 1 successful attempt. Pt tolerated well. Gauze and tape applied.  ?

## 2021-07-27 NOTE — Telephone Encounter (Signed)
Late entry.  I called pt and talked yesterday.  I agree with the LP and plan per neuro.  Rationale d/w pt.  He is going to proceed with LP.  He reported that staff is aware of eliquis use and he'll notify staff again at the time of the procedure but he has been told it was okay to proceed on the med.  Will await results and notes from consultants.  I thank all involved and I thanked him for taking the call.  ?

## 2021-07-28 ENCOUNTER — Telehealth: Payer: Self-pay | Admitting: Neurology

## 2021-07-28 ENCOUNTER — Ambulatory Visit
Admission: RE | Admit: 2021-07-28 | Discharge: 2021-07-28 | Disposition: A | Discharge status: Home / Self Care | Payer: Medicare Other | Source: Ambulatory Visit | Attending: Neurology | Admitting: Neurology

## 2021-07-28 DIAGNOSIS — N281 Cyst of kidney, acquired: Secondary | ICD-10-CM | POA: Diagnosis not present

## 2021-07-28 DIAGNOSIS — C8598 Non-Hodgkin lymphoma, unspecified, lymph nodes of multiple sites: Secondary | ICD-10-CM

## 2021-07-28 DIAGNOSIS — J8410 Pulmonary fibrosis, unspecified: Secondary | ICD-10-CM | POA: Diagnosis not present

## 2021-07-28 DIAGNOSIS — C8591 Non-Hodgkin lymphoma, unspecified, lymph nodes of head, face, and neck: Secondary | ICD-10-CM

## 2021-07-28 DIAGNOSIS — C8589 Other specified types of non-Hodgkin lymphoma, extranodal and solid organ sites: Secondary | ICD-10-CM

## 2021-07-28 DIAGNOSIS — N200 Calculus of kidney: Secondary | ICD-10-CM | POA: Diagnosis not present

## 2021-07-28 DIAGNOSIS — K7689 Other specified diseases of liver: Secondary | ICD-10-CM | POA: Diagnosis not present

## 2021-07-28 DIAGNOSIS — I2510 Atherosclerotic heart disease of native coronary artery without angina pectoris: Secondary | ICD-10-CM | POA: Diagnosis not present

## 2021-07-28 DIAGNOSIS — J439 Emphysema, unspecified: Secondary | ICD-10-CM | POA: Diagnosis not present

## 2021-07-28 LAB — CYTOLOGY - NON PAP

## 2021-07-28 MED ORDER — IOPAMIDOL (ISOVUE-300) INJECTION 61%
100.0000 mL | Freq: Once | INTRAVENOUS | Status: AC | PRN
  Administered 2021-07-28: 100 mL via INTRAVENOUS

## 2021-07-28 NOTE — Telephone Encounter (Signed)
Spoke with Santiago Glad.  There were multiple lab orders for CSF but there was also one lab order noted "surgical pathology".  She would like to clarify what Dr. Jaynee Eagles needs so she can tell pathology.  She will fax [place] the order that she sees. We will then have Dr Jaynee Eagles review and call her back.  ?

## 2021-07-28 NOTE — Telephone Encounter (Signed)
Pt LVM calling back to speak with [place]. ?Attempted to contact the patient LVM for him to call back. ?

## 2021-07-28 NOTE — Telephone Encounter (Signed)
Spoke to pt and he was able to read off the AVS that it did have CT of blood vessels. He has had LP, CT chest pelvis and abd.  He had MRI done 07-19-2021 Dr. Jaynee Eagles was aware of this. I did not see that test ordered at this time.  If any change then will call him back.  He appreciated call back.  ?

## 2021-07-28 NOTE — Telephone Encounter (Signed)
Spoke with Dr Jaynee Eagles. Surgical pathology under CSF order is not needed. Santiago Glad w/ [facility] called back and I let her know. They will void that particular order.  ?

## 2021-07-28 NOTE — Telephone Encounter (Signed)
I called pt and he had MRI brain w/wo 07-19-2021 ordered by Dr. [name] ([facility]),   printed report.  He seems to think that there was a order for MRI on AVS when in to see [place].  I donot see that on CT abd chest pelvis which was done at GI today.  He will check his records when he gets home and call me back  (about an hour or so).  Appreicated call  back.  ?

## 2021-07-28 NOTE — Telephone Encounter (Signed)
[facility] Santiago Glad) calling to verify surgical pathology. Want to know what physician want done with the spinal fluid. Would like a call from the nurse. ?

## 2021-07-28 NOTE — Telephone Encounter (Signed)
[facility] called while pt was there for his CT of chest, abdomen and pelvis.  Pt informed Caryl Pina that he believes he has already had the MRI of his head at [facility] is asking if RN can call him to confirm that or not. ?

## 2021-07-29 ENCOUNTER — Encounter: Payer: Self-pay | Admitting: Neurology

## 2021-07-31 LAB — EPSTEIN BARR VIRUS DNA, QUANT RTPCR
EBV DNA, QN PCR: NOT DETECTED {Log_copies}/mL
EBV DNA, QN PCR: NOT DETECTED {copies}/mL

## 2021-08-02 ENCOUNTER — Telehealth: Payer: Self-pay | Admitting: *Deleted

## 2021-08-02 ENCOUNTER — Telehealth: Payer: Self-pay | Admitting: Neurology

## 2021-08-02 NOTE — Telephone Encounter (Signed)
-----   Message from Melvenia Beam, MD sent at 08/02/2021  4:01 PM EDT ----- ?So far there are no abnormalities seen in cerebral spinal fluid.  No malignant cells.  No fungus.  Protein and glucose normal.  No increase in immunoglobulins, no herpes simplex found, no viruses found such as EBV, unfortunately there was an inadequate number of cells in the sample to perform immunophenotyping for leukemia/lymphoma so I recommend repeating MRI of the brain in 6 to 8 weeks to follow the lesions.  We are still awaiting a few test, please call and let patient know. ?

## 2021-08-02 NOTE — Telephone Encounter (Signed)
I called pt and relayed the results below of CT's (PELVIS, ABD, CHEST)  and lumbar puncture results as per below. I relayed that she did order the CTA w/wo of head and neck and GI was sent orders.  He may call to schedule.  He stated he did have #.  He verbalized understanding results.  I will forward to Dr. Damita Dunnings as well.  He appreciated call back.   He will call back if questions.   ?Cc: Tonia Ghent, MD ?CT abdomen pelvis chest: There is no evidence for malignancy/neoplastic or a lymphoproliferative/lymphoma process in the chest abdomen or pelvis.  This is great news.  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  (CC Dr. Damita Dunnings).  ? ?

## 2021-08-02 NOTE — Telephone Encounter (Signed)
Medicare/aarp order sent to [facility], [facility] they will reach out to the patient to schedule.  °

## 2021-08-03 ENCOUNTER — Telehealth: Payer: Self-pay | Admitting: Family Medicine

## 2021-08-03 DIAGNOSIS — G2581 Restless legs syndrome: Secondary | ICD-10-CM

## 2021-08-03 DIAGNOSIS — I10 Essential (primary) hypertension: Secondary | ICD-10-CM

## 2021-08-03 NOTE — Telephone Encounter (Signed)
Pt called stating that he has had a MRI and a CTI Scan in 3 areas and now they want him to have another MRI and CTI in 6 or 7 weeks. Pt states that they haven't said anything about his dizziness and blurred vision.Pt states that he is very worried and would like for [name] to call him to discuss as soon as possible hopefully today. Pt states that he don't want to wait another 2 weeks for answers. Please advise. ?

## 2021-08-03 NOTE — Telephone Encounter (Signed)
I called pt and gave him results of LP studies and CT chest abd pelvis, relayed that the CTA head and neck ordered.  See other phone message.  ?

## 2021-08-04 MED ORDER — DIAZEPAM 2 MG PO TABS: 1.0000 mg | ORAL_TABLET | Freq: Three times a day (TID) | ORAL | Status: DC | PRN

## 2021-08-04 MED ORDER — ROPINIROLE HCL 0.5 MG PO TABS: 0.5000 mg | ORAL_TABLET | Freq: Every day | ORAL | 0 refills | Status: DC

## 2021-08-04 NOTE — Telephone Encounter (Signed)
Lab appt scheduled for tomorrow at 10:15 am.  ?

## 2021-08-04 NOTE — Telephone Encounter (Signed)
Late entry.  I called patient yesterday at approximately 5:30 PM.  Discussed recent events.  Discussed results from lumbar puncture and also recent CT.  Neither 1 showed evidence of malignancy.  Discussed aortic changes and rationale for follow-up scan in 1 year.  Discussed rationale for follow-up MRI in about 6-8 weeks per neurology.  He also has CTA head pending.  All discussed, I agree with the plan that has been proposed given his situation. ? ?He is still symptomatic and Dr. Benjamine Mola sent patient a prescription for diazepam to try that for vertigo.  Routine cautions discussed with patient. ? ?He is also had nocturnal symptoms in the upper legs with aching/crawling sensation along the thighs.  It was previously rare but now it is happening frequently, when it does happen it only happens at night.  It is happening most nights of the week now.  I told him I wanted to consider options. ? ?Please call patient and set up a nonfasting lab appointment.  He could have restless leg syndrome but I think it makes sense to check a TSH and B12 first.  Assuming those are normal/reassuring, then it would be reasonable to start Requip 0.5 mg at night.  I would not take the medication with diazepam. I went ahead and sent the prescription but I asked for the pharmacy to hold the prescription until the patient requests it.  I think it makes sense to get the labs done first prior to starting the medication, ie do not have the patient fill the medication yet.  Thanks. ?

## 2021-08-05 ENCOUNTER — Other Ambulatory Visit (INDEPENDENT_AMBULATORY_CARE_PROVIDER_SITE_OTHER): Payer: Medicare Other

## 2021-08-05 DIAGNOSIS — G2581 Restless legs syndrome: Secondary | ICD-10-CM | POA: Diagnosis not present

## 2021-08-05 DIAGNOSIS — I10 Essential (primary) hypertension: Secondary | ICD-10-CM

## 2021-08-05 LAB — TSH: TSH: 3.88 u[IU]/mL (ref 0.35–5.50)

## 2021-08-05 LAB — VITAMIN B12: Vitamin B-12: 299 pg/mL (ref 211–911)

## 2021-08-06 ENCOUNTER — Ambulatory Visit
Admission: RE | Admit: 2021-08-06 | Discharge: 2021-08-06 | Disposition: A | Discharge status: Home / Self Care | Payer: Medicare Other | Source: Ambulatory Visit | Attending: Neurology | Admitting: Neurology

## 2021-08-06 DIAGNOSIS — C8591 Non-Hodgkin lymphoma, unspecified, lymph nodes of head, face, and neck: Secondary | ICD-10-CM

## 2021-08-06 DIAGNOSIS — H538 Other visual disturbances: Secondary | ICD-10-CM | POA: Diagnosis not present

## 2021-08-06 DIAGNOSIS — Z8673 Personal history of transient ischemic attack (TIA), and cerebral infarction without residual deficits: Secondary | ICD-10-CM | POA: Diagnosis not present

## 2021-08-06 DIAGNOSIS — G0490 Encephalitis and encephalomyelitis, unspecified: Secondary | ICD-10-CM

## 2021-08-06 DIAGNOSIS — R42 Dizziness and giddiness: Secondary | ICD-10-CM | POA: Diagnosis not present

## 2021-08-06 DIAGNOSIS — I672 Cerebral atherosclerosis: Secondary | ICD-10-CM | POA: Diagnosis not present

## 2021-08-06 DIAGNOSIS — I639 Cerebral infarction, unspecified: Secondary | ICD-10-CM

## 2021-08-06 DIAGNOSIS — C8589 Other specified types of non-Hodgkin lymphoma, extranodal and solid organ sites: Secondary | ICD-10-CM

## 2021-08-06 DIAGNOSIS — C8598 Non-Hodgkin lymphoma, unspecified, lymph nodes of multiple sites: Secondary | ICD-10-CM

## 2021-08-06 MED ORDER — IOPAMIDOL (ISOVUE-370) INJECTION 76%
100.0000 mL | Freq: Once | INTRAVENOUS | Status: AC | PRN
  Administered 2021-08-06: 100 mL via INTRAVENOUS

## 2021-08-10 ENCOUNTER — Telehealth: Payer: Self-pay | Admitting: Neurology

## 2021-08-10 NOTE — Telephone Encounter (Signed)
I called [facility] back.  There was not speciman ref #  to give.  They only thing that she stated was that there was a LMVM at GI [phone] #.  Concerning HSV.  I relayed that I see results of this as nonreactive. She concurred.   ?

## 2021-08-10 NOTE — Telephone Encounter (Signed)
Spoke with patient and scheduled a telephone visit on Monday, April 24 at 4 PM. Pt was very Patent attorney.  His questions were answered.  He understands we may be able to repeat the MRI in 6 weeks.  He is also aware that all of his CT results have been sent over to Dr. Damita Dunnings to review at his next appointment as there were incidental findings such as an ascending thoracic aneurysm, kidney stones, benign blood vessel malformation in liver.  Patient verbalized appreciation for the call. ?

## 2021-08-10 NOTE — Telephone Encounter (Signed)
Please call and discuss with patient: CTA of the head and neck unremarkable, no signs of vasculitis or other concerning etiologies. I would like to repeat the MRI of his brain in 8-12 weeks if he is amenable let me know and I will send myself a reminder to order it at that time unless anything gets worse. He may need another lumbar puncture unfortunately, sometimes when we are looking for lymphoma we need to run that particular test multiple times but I'd wait for the repeat MRI. Let me know, thanks  ?Written by Melvenia Beam, MD on 08/10/2021  7:36 AM EDT ?Seen by patient [name] [name] on 08/10/2021 10:00 AM ?

## 2021-08-10 NOTE — Telephone Encounter (Signed)
[facility] Lelan Pons) Would like a call back to discuss urgent results. ?

## 2021-08-10 NOTE — Telephone Encounter (Signed)
Spoke with Dr Jaynee Eagles. Will offer a telephone visit first for this coming Monday 4/24 at 4 pm. She will discuss where we are at so far while on the phone and then we can repeat MRI even sooner and schedule him for a follow-up at a later date.  ?

## 2021-08-10 NOTE — Telephone Encounter (Signed)
Pt called needing to discuss his results with the RN. Please advise. ?

## 2021-08-10 NOTE — Telephone Encounter (Signed)
Results of scans have all been sent to Dr Phillip Heal. Pt has appt with him on 08/23/21.  ?

## 2021-08-10 NOTE — Telephone Encounter (Signed)
Spoke with pt and discussed results of MRI brain. He is amenable to repeating in 8-12 weeks and is aware that pending those results he may need another lumbar puncture. Pt's questions were answered but he does want to schedule an appt in the meantime with Dr [name] [name] to go over questions he has. Although he understands we haven't found a cause of his symptoms he is still anxious and has questions for Dr [name] [name] and does not want to wait 8-12 weeks to discuss.  ?

## 2021-08-16 ENCOUNTER — Ambulatory Visit (INDEPENDENT_AMBULATORY_CARE_PROVIDER_SITE_OTHER): Payer: Medicare Other | Admitting: Neurology

## 2021-08-16 DIAGNOSIS — R9089 Other abnormal findings on diagnostic imaging of central nervous system: Secondary | ICD-10-CM

## 2021-08-18 DIAGNOSIS — H3562 Retinal hemorrhage, left eye: Secondary | ICD-10-CM | POA: Diagnosis not present

## 2021-08-18 DIAGNOSIS — H401112 Primary open-angle glaucoma, right eye, moderate stage: Secondary | ICD-10-CM | POA: Diagnosis not present

## 2021-08-18 DIAGNOSIS — H0102B Squamous blepharitis left eye, upper and lower eyelids: Secondary | ICD-10-CM | POA: Diagnosis not present

## 2021-08-18 DIAGNOSIS — H40052 Ocular hypertension, left eye: Secondary | ICD-10-CM | POA: Diagnosis not present

## 2021-08-18 DIAGNOSIS — H532 Diplopia: Secondary | ICD-10-CM | POA: Diagnosis not present

## 2021-08-18 DIAGNOSIS — H0102A Squamous blepharitis right eye, upper and lower eyelids: Secondary | ICD-10-CM | POA: Diagnosis not present

## 2021-08-18 DIAGNOSIS — H43813 Vitreous degeneration, bilateral: Secondary | ICD-10-CM | POA: Diagnosis not present

## 2021-08-18 DIAGNOSIS — H5319 Other subjective visual disturbances: Secondary | ICD-10-CM | POA: Diagnosis not present

## 2021-08-18 DIAGNOSIS — Z961 Presence of intraocular lens: Secondary | ICD-10-CM | POA: Diagnosis not present

## 2021-08-18 DIAGNOSIS — H35373 Puckering of macula, bilateral: Secondary | ICD-10-CM | POA: Diagnosis not present

## 2021-08-18 DIAGNOSIS — H34212 Partial retinal artery occlusion, left eye: Secondary | ICD-10-CM | POA: Diagnosis not present

## 2021-08-23 ENCOUNTER — Encounter: Payer: Self-pay | Admitting: Family Medicine

## 2021-08-23 ENCOUNTER — Other Ambulatory Visit: Payer: Self-pay | Admitting: Internal Medicine

## 2021-08-23 ENCOUNTER — Ambulatory Visit (INDEPENDENT_AMBULATORY_CARE_PROVIDER_SITE_OTHER): Payer: Medicare Other | Admitting: Family Medicine

## 2021-08-23 VITALS — BP 160/80 | HR 89 | Temp 97.1°F | Ht 70.0 in | Wt 192.0 lb

## 2021-08-23 DIAGNOSIS — I639 Cerebral infarction, unspecified: Secondary | ICD-10-CM

## 2021-08-23 DIAGNOSIS — I779 Disorder of arteries and arterioles, unspecified: Secondary | ICD-10-CM

## 2021-08-23 DIAGNOSIS — R42 Dizziness and giddiness: Secondary | ICD-10-CM

## 2021-08-23 MED ORDER — DIAZEPAM 2 MG PO TABS: 1.0000 mg | ORAL_TABLET | Freq: Three times a day (TID) | ORAL | 0 refills | Status: DC | PRN

## 2021-08-23 NOTE — Progress Notes (Signed)
We talked about crawling sensation in the B thighs at night.  He tried requip but didn't tolerate that, didn't sleep well and had trouble with abnormal dreams.  Massaging the area helps, right before bed.  He is going to try to manage this as is without taking extra medication. ? ?Didn't try diazepam for vertigo.  D/w pt about routine cautions.  Rx printed for patient, he can decide about filling it.  It was initially prescribed per outside clinic but he did not fill the original prescription.  We talked about recent imaging and lumbar puncture without any malignancy identified.  He has plan for follow-up imaging per neurology. ? ?Recent imaging with incidental finding discussed with patient.  Fusiform aneurysm of the ascending thoracic aorta measuring 4.2 ?cm. Recommend annual imaging followup by CTA or MRA. Note already made in EMR about aorta follow up, d/w pt.   ? ?Parking form done for patient.  We talked about starting balance therapy.  Referral placed. ? ?Fatigue noted in the meantime, with sleep disruption noted and that could contribute. Rhinorrhea in the AM.  Sneezing.  Better as the day goes on.  Going on for months.  ? ?Meds, vitals, and allergies reviewed.  ? ?ROS: Per HPI unless specifically indicated in ROS section  ? ?GEN: nad, alert and oriented ?HEENT: ncat ?NECK: supple w/o LA ?CV: RRR with occ ectopy.  ?PULM: ctab, no inc wob ?ABD: soft, +bs ?EXT: no edema ?SKIN: no acute rash ? ?30 minutes were devoted to patient care in this encounter (this includes time spent reviewing the patient's file/history, interviewing and examining the patient, counseling/reviewing plan with patient).  ? ?

## 2021-08-23 NOTE — Patient Instructions (Addendum)
You should get a call about PT at the neurology clinic.  ?You could use diazepam if needed for vertigo.  Sedation caution.   ?Take care.  Glad to see you. ?Update me as needed.   ?If your BP is persistently elevated at home than let me know.  ?

## 2021-08-25 DIAGNOSIS — I779 Disorder of arteries and arterioles, unspecified: Secondary | ICD-10-CM | POA: Insufficient documentation

## 2021-08-25 LAB — CSF CULTURE W GRAM STAIN
MICRO NUMBER:: 13220171
Result:: NO GROWTH
SPECIMEN QUALITY:: ADEQUATE

## 2021-08-25 LAB — HERPES SIMPLEX VIRUS, TYPE 1 AND 2 DNA,QUAL,RT PCR
HSV 1 DNA: NOT DETECTED
HSV 2 DNA: NOT DETECTED

## 2021-08-25 LAB — CSF CELL COUNT WITH DIFFERENTIAL
RBC Count, CSF: 1 cells/uL — ABNORMAL HIGH
WBC, CSF: 2 cells/uL (ref 0–5)

## 2021-08-25 LAB — FUNGUS CULTURE W SMEAR
CULTURE:: NO GROWTH
MICRO NUMBER:: 13220170
SMEAR:: NONE SEEN
SPECIMEN QUALITY:: ADEQUATE

## 2021-08-25 LAB — ANAEROBIC CULTURE W GRAM STAIN
MICRO NUMBER:: 13220169
SPECIMEN QUALITY:: ADEQUATE

## 2021-08-25 LAB — GLUCOSE, CSF: Glucose, CSF: 56 mg/dL (ref 40–80)

## 2021-08-25 LAB — CNS IGG SYNTHESIS RATE, CSF+BLOOD
Albumin Serum: 4.8 g/dL (ref 3.6–5.1)
Albumin, CSF: 28.6 mg/dL (ref 8.0–42.0)
CNS-IgG Synthesis Rate: -4.3 mg/24 h (ref <=3.3)
IgG (Immunoglobin G), Serum: 1370 mg/dL (ref 600–1540)
IgG Total CSF: 3.8 mg/dL (ref 0.8–7.7)
IgG-Index: 0.47 (ref <0.70)

## 2021-08-25 LAB — PROTEIN, CSF: Total Protein, CSF: 50 mg/dL (ref 15–60)

## 2021-08-25 LAB — VDRL, CSF: VDRL Quant, CSF: NONREACTIVE

## 2021-08-25 LAB — LEUKEMIA/LYMPHOMA EVALUATION PANEL

## 2021-08-25 LAB — OLIGOCLONAL BANDS, CSF + SERM

## 2021-08-25 NOTE — Assessment & Plan Note (Signed)
Recent imaging with incidental finding discussed with patient.  Fusiform aneurysm of the ascending thoracic aorta measuring 4.2 ?cm. Recommend annual imaging followup by CTA or MRA. Note already made in EMR about aorta follow up, d/w pt.    ?

## 2021-08-25 NOTE — Assessment & Plan Note (Signed)
Refer to physical therapy/balance training.  He has not yet tried diazepam for vertigo.  It would be reasonable to try.  Routine cautions given to patient.  We talked about his previous imaging with the plan for follow-up MRI per neurology.  I will await that result.  Fortunately no malignancy identified so far. ?

## 2021-08-29 ENCOUNTER — Encounter: Payer: Self-pay | Admitting: Family Medicine

## 2021-08-30 ENCOUNTER — Encounter: Payer: Self-pay | Admitting: Physical Therapy

## 2021-08-30 ENCOUNTER — Ambulatory Visit: Payer: Medicare Other | Attending: Family Medicine | Admitting: Physical Therapy

## 2021-08-30 DIAGNOSIS — R42 Dizziness and giddiness: Secondary | ICD-10-CM | POA: Diagnosis not present

## 2021-08-30 DIAGNOSIS — R2689 Other abnormalities of gait and mobility: Secondary | ICD-10-CM | POA: Diagnosis not present

## 2021-08-30 DIAGNOSIS — R2681 Unsteadiness on feet: Secondary | ICD-10-CM | POA: Diagnosis not present

## 2021-08-30 NOTE — Therapy (Signed)
?OUTPATIENT PHYSICAL THERAPY VESTIBULAR EVALUATION ? ? ? ? ?Patient Name: [name] [name] ?MRN: [id number] ?DOB:Oct 01, 1934, 87 y.o., male ?Today's Date: 08/30/2021 ? ?PCP: Dr. Elsie Stain ?REFERRING PROVIDER: Tonia Ghent ? ? PT End of Session - 08/30/21 2046   ? ? Visit Number 1   ? Number of Visits 4   ? Date for PT Re-Evaluation 10/01/21   ? Authorization Type Medicare   ? Authorization Time Period 08-30-21 - 10-22-21   ? PT Start Time 7169   ? PT Stop Time 1029   ? PT Time Calculation (min) 54 min   ? Activity Tolerance Patient tolerated treatment well   ? Behavior During Therapy [facility] for tasks assessed/performed   ? ?  ?  ? ?  ? ? ?Past Medical History:  ?Diagnosis Date  ? Anticoagulated on Coumadin   ? followed in [facility]RR at [facility]  ? Aortic stenosis   ? a. Echo 2/15: Severe LVH, EF 55-60%, Gr 1 DD, mild to mod AS (mean 20 mmHg, peak 31 mmHg), AVA 1.4-1.5 cm2, mild AI, MAC, trivial MR, LA upper limits of normal, RVSP 22 mmHg, mild RAE;   b. Echo 2/16:  EF 55-60%, no RWMA, mild AS (mean 17 mmHg, peak 27 mmHg), AVA 1.5 cm2, mild AI, MAC, mild LAE  ? Arthritis   ? Bladder neoplasm   ? Chronic cough   ? NO CARDIAC OR PULMONARY RELATED  ? Complication of anesthesia   ? Coronary artery disease CARDIOLOGIST-  DR KLEIN/ ALLRED  ? a. s/p CABG 2004, b. LHC with patent grafts 07/2005, ejection fraction 50%;  c. Myoview 2/15: no ischemia, EF 61%  ? Dry eyes   ? Dyslipidemia   ? GERD (gastroesophageal reflux disease)   ? HTN (hypertension)   ? Mild aortic stenosis   ? PAF (paroxysmal atrial fibrillation) ([place])   ? CHADS2-VASc:  4  ? Paroxysmal atrial fibrillation (HCC)   ? PONV (postoperative nausea and vomiting)   ? From having surgery back in the 1950's  ? 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 a. Amiodarone Rx  ? S/P CABG x 5 2004  ? Stroke ([place]) 03/10/2020  ? Stroke in the Left eye  ? ?Past Surgical History:  ?Procedure Laterality Date  ? CARDIAC CATHETERIZATION  07-26-2005   DR GAMBLE  ? PRESERVED LVF/  EF 50%/ PATENT GRAFTS  ? CARDIAC CATHETERIZATION  03-04-2003  DR GAMBLE  ? SEVERE 3 VESSEL DISEASE  ? [place]  ? PAF/ FALSE POSITIVE STRESS TEST  ? CAROTID ENDARTERECTOMY Left 2022  ? CATARACT EXTRACTION W/ INTRAOCULAR LENS IMPLANT Right   ? CORONARY ARTERY BYPASS GRAFT  03-08-2003  DR CVELFYBO  ? LIMA TO LAD/ SVG TO OM2/  SVG TO DIAGONAL / SVG TO PDA & PLA  ? CYSTOSCOPY WITH BIOPSY N/A 12/25/2012  ? Procedure: CYSTOSCOPY WITH BLADDER BIOPSY;  Surgeon: Fredricka Bonine, MD;  Location: [facility];  Service: Urology;  Laterality: N/A;  ? ELECTROPHYSIOLOGY STUDY  07-27-2005  DR Cristopher Peru  ? MAPPING --   RESULT NONINDUCIBLE VT OR SVT/  DX RIGHT VENTRICULAR OUTFLOW TRACT VT AND RULES OUT MORE MALIGNANT CAUSES OF VT  ? ENDARTERECTOMY Left 07/08/2020  ? Procedure: LEFT CAROTID ENDARTERECTOMY;  Surgeon: Serafina Mitchell, MD;  Location: [place];  Service: Vascular;  Laterality: Left;  ? [place]  ? KNEE ARTHROSCOPY Right 1994  ?  [place] SURGERY  1999  ? L4 -- L5  ? MOHS SURGERY    ? by Dr. Levada Dy 2019  ? REMOVAL VOCAL CORD POLYPS  1978  ? TONSILLECTOMY  AS CHILD  ? TRANSTHORACIC ECHOCARDIOGRAM  08/08/2011  ? MILD LVH/  EF 91-47%/  GRADE I DIASTOLIC DYSFUNCTION/ MILD AV STENOSIS  ? ?Patient Active Problem List  ? Diagnosis Date Noted  ? Aorta disorder ([place]) 08/25/2021  ? Advance care planning 10/28/2020  ? Right knee pain 09/21/2020  ? Traumatic leg ulcer ([place]) 08/14/2020  ? Bilateral pseudophakia 07/27/2020  ? Carpal tunnel syndrome 07/27/2020  ? Dermatochalasis 07/27/2020  ? Headache 07/27/2020  ? Hematuria 07/27/2020  ? Insomnia 07/27/2020  ? Postsurgical aortocoronary bypass status 07/27/2020  ? Pure hypercholesterolemia 07/27/2020  ? Radiculopathy 07/27/2020  ? Vitamin D deficiency 07/27/2020  ? Stenosis of internal carotid artery with cerebral infarction, right ([place]) 07/08/2020  ? Glaucoma  (increased eye pressure) 06/19/2020  ? Stress reaction 06/19/2020  ? Imbalance 03/27/2020  ? Lightheadedness 03/27/2020  ? Retinal artery occlusion 03/10/2020  ? Constipation 12/22/2019  ? Right foot pain 09/11/2019  ? Leg pain 06/23/2019  ? Muscle pain 04/04/2019  ? Laryngopharyngeal reflux (LPR) 05/22/2018  ? Rhinitis sicca 05/22/2018  ? Sciatica 12/28/2017  ? Neuropathy 10/07/2017  ? Foot swelling 10/07/2017  ? Shoulder pain 03/29/2017  ? CAD (coronary artery disease), autologous vein bypass graft 10/31/2016  ? Family history of colon cancer 10/31/2016  ? History of adenomatous polyp of colon 10/31/2016  ? Osteoarthritis 10/31/2016  ? Atrial fibrillation ([place]) 10/31/2016  ? Syncope 02/11/2016  ? High risk medication use   ? Skin lesion 06/04/2015  ? Lower back pain 05/01/2015  ? Elevated TSH 12/26/2014  ? Back pain 01/22/2014  ? Aortic stenosis 05/16/2013  ? Bladder neoplasm 11/23/2012  ? BCC (basal cell carcinoma of skin) 08/29/2012  ? Vertigo 08/24/2012  ? GERD (gastroesophageal reflux disease) 05/01/2012  ? Hyperlipidemia 03/14/2012  ? TMJ pain dysfunction syndrome 01/20/2012  ? Long term (current) use of anticoagulants 08/31/2011  ? RVOT ventricular tachycardia ([place])   ? Cough 04/29/2009  ? HEARING LOSS, SUDDEN 12/03/2007  ? Essential hypertension 02/22/2007  ? PAF (paroxysmal atrial fibrillation) ([place]) 02/22/2007  ? ? ?ONSET DATE: mid- Feb. 2023 ? ?REFERRING DIAG: Vertigo ? ?THERAPY DIAG:  ?Dizziness and giddiness - Plan: PT plan of care cert/re-cert ? ?Unsteadiness on feet - Plan: PT plan of care cert/re-cert ? ?Other abnormalities of gait and mobility - Plan: PT plan of care cert/re-cert ? ?SUBJECTIVE:  ? ?SUBJECTIVE STATEMENT: ?Pt reports dizziness started around mid- Feb. This year; called MD's office and nurse recommended that he go to the ED ([facility]): has been having double vision and blurred vision; MRI revealed "tiny" brainstem CVA; saw Dr. Jaynee Eagles a few weeks ago; had CT scans of abdomen and pelvis; had  CT scan of brain - Dr. Jaynee Eagles saw nothing - says she was looking for lymphoma; reports he is very dizzy - if he looks straight he has blurred vision, if he looks down he has double vision; if he tilts head back and looks down vision is clear; pt had an earlier appt with PT - cancelled it because the vertigo got worse; states dizziness is constant - "never goes away"  ? ?Pt accompanied by: self ? 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 Amiodarone Rx S/P CABG x 52004 Stroke (HCC)03/10/2020, Diplopia, Occipital CVA Nov. 2021 ? ? ?PAIN:  ?Are you having pain? No ? ?  PRECAUTIONS: Other: dizziness ? ?WEIGHT BEARING RESTRICTIONS No ? ?FALLS: Has patient fallen in last 6 months? No ? ?LIVING ENVIRONMENT: ?Lives with: lives with their family and lives with their spouse ?Lives in: House/apartment ? ?PLOF: Independent ? ?PATIENT GOALS reduce the dizziness ? ?OBJECTIVE:  ? ?DIAGNOSTIC FINDINGS: Recent imaging with incidental finding discussed with patient.  Fusiform aneurysm of the ascending thoracic aorta measuring 4.2 ?cm. Recommend annual imaging followup by CTA or MRA. Note already made in EMR about aorta follow up, d/w pt.   ?  ? ?COGNITION: ?Overall cognitive status: Within functional limits for tasks assessed ?  ?SENSATION: ?WFL ? ? ?POSTURE: No Significant postural limitations ? ? ?Cervical ROM:  WNL's ? ?STRENGTH: WFL's ? ?BED MOBILITY:  ?Sit to supine Complete Independence ?Supine to sit Complete Independence ? ?TRANSFERS: ?Assistive device utilized: None  ?Sit to stand: Complete Independence ?Stand to sit: Complete Independence ? ?GAIT: ?Gait pattern: WFL ?Distance walked: 100 ?Assistive device utilized: None ?Level of assistance: Modified independence ?Comments: pt reports dizziness during amb. ? ?FUNCTIONAL TESTs:  ?MCTSIB: Condition 1: Avg of 3 trials: 30 sec, Condition 2: Avg of 3 trials: 30 sec, Condition 3: Avg of 3 trials: 30 sec, Condition 4: Avg of 3  trials: 17 sec, and Total Score: 107/120 ? ?PATIENT SURVEYS:  ?FOTO DPS 43/100; risk adjusted 48/100 ? ? ?VESTIBULAR ASSESSMENT ? ? GENERAL OBSERVATION: Pt is an 87 yr old gentleman with c/o dizziness whic

## 2021-08-31 NOTE — Progress Notes (Signed)
Physician Documentation  ?Your signature is required to indicate approval of the treatment plan as stated above. By signing this report, you are approving the plan of care. Please sign and either send electronically or print and fax the signed copy to the number below. If you approve with modifications, please indicate those in the space provided._______________________________________________________________  ?Physician Signature: ____Graham Damita Dunnings ?Date:__05/09/23 Time:__8:23 AM  ?

## 2021-09-01 ENCOUNTER — Other Ambulatory Visit: Payer: Self-pay | Admitting: Family Medicine

## 2021-09-01 MED ORDER — MECLIZINE HCL 25 MG PO TABS
12.5000 mg | ORAL_TABLET | Freq: Three times a day (TID) | ORAL | 2 refills | Status: DC | PRN
Start: 2021-09-01 — End: 2021-09-28

## 2021-09-02 ENCOUNTER — Encounter (INDEPENDENT_AMBULATORY_CARE_PROVIDER_SITE_OTHER): Payer: Medicare Other | Admitting: Ophthalmology

## 2021-09-02 DIAGNOSIS — I10 Essential (primary) hypertension: Secondary | ICD-10-CM | POA: Diagnosis not present

## 2021-09-02 DIAGNOSIS — H35033 Hypertensive retinopathy, bilateral: Secondary | ICD-10-CM | POA: Diagnosis not present

## 2021-09-02 DIAGNOSIS — H34832 Tributary (branch) retinal vein occlusion, left eye, with macular edema: Secondary | ICD-10-CM | POA: Diagnosis not present

## 2021-09-02 DIAGNOSIS — H43813 Vitreous degeneration, bilateral: Secondary | ICD-10-CM

## 2021-09-06 NOTE — Progress Notes (Signed)
?[facility] ? ? ? ?Provider:  Dr Jaynee Eagles ?Requesting Provider: Dr. Barnet Pall eye care ?Primary Care Provider:  Tonia Ghent, MD ? 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 ?Virtual Visit via Telephone Note ? ?I connected with [name] [name] on 09/06/21 at  4:00 PM EDT by telephone and verified that I am speaking with the correct person using two identifiers. ? ?Location: ?Patient: home ?Provider: office ?  ?I discussed the limitations, risks, security and privacy concerns of performing an evaluation and management service by telephone and the availability of in person appointments. I also discussed with the patient that there may be a patient responsible charge related to this service. The patient expressed understanding and agreed to proceed. ? ? ?Follow Up Instructions: ? ?  ?I discussed the assessment and treatment plan with the patient. The patient was provided an opportunity to ask questions and all were answered. The patient agreed with the plan and demonstrated an understanding of the instructions. ?  ?The patient was advised to call back or seek an in-person evaluation if the symptoms worsen or if the condition fails to improve as anticipated. ? ?I provided 29 minutes of non-face-to-face time during this encounter. ? ? ?Melvenia Beam, MD ? ? ?August 16, 2021: I am speaking with patient and his wife over the phone to follow-up on finding.  Patient was seen by Dr. [name] for diplopia, MRI of the brain showed an occipital stroke but also right increased signal intensity in the diffusion weighted imaging in the posterior horn of the right lateral ventricle some of these demonstrating peripheral curvilinear enhancement, nonspecific and differential diagnosis includes primary CNS lymphoma versus secondary lymphoma versus metastatic disease versus a postinfectious process or inflammatory process, demyelinating disease or sarcoidosis.  We reviewed his work-up today which unremarkable: ? ?CT of  the abdomen and pelvis showed No evidence for a neoplastic or lymphoproliferative process in ?the chest, abdomen or pelvis ?LP showed normal glucose, protein, cell count, flow cytometry did not find enough cells to examine, protein, cell differential, HSV, sarcoid, fungal culture, CMV, EBV, VDRL, oligoclonal bands in CSF IgG index were all unremarkable ?Labs were all unremarkable including IgG and IgM CMV antibodies, sed rate, ANA with reflex, ACE, CRP, SPEP/IFE, ANCA all unremarkable ? ?At this time I recommended we repeat MRI of the brain in 2 to 3 months.  Patient is stable.  No worsening.  If he worsens we will repeat the MRI sooner, he is to reach out to [place]. ? ?HPI:  [name] [name] is a 87 y.o. male here as requested by Tonia Ghent, MD for possible CNS lymphoma.  He is a referral from Dr. [name].  Dr. [name] seeing him for skew deviation.  There was an occipital stroke noted.  However also in the MRI showed yet impression read that impression right increased signal intensity on the diffusion weighted imaging in the posterior horn of the right lateral ventricle, some of these areas demonstrate peripheral curvilinear enhancement, this is nonspecific and differential diagnosis includes primary CNS lymphoma versus secondary lymphoma versus metastatic disease versus postinfectious process versus less likely inflammatory process such as sarcoid, demyelinating disease or sarcoidosis.  Recommend lumbar puncture.  I spent a long time discussing this with patient and wife, wife appears to have Parkinson's disease and possibly Parkinson's disease dementia I did not get the feeling as she understood, I tried to explain to them what we do not know what this is a we need more testing,  they could not understand why I did not know what it was, I tried to go through the entire differential with them.  At this time organ to do an extensive work-up. ? ?Reviewed notes, labs and imaging from outside physicians, which  showed: see above ? ?Review of Systems: ?Patient complains of symptoms per HPI as well as the following symptoms diplopia. Pertinent negatives and positives per HPI. All others negative. ? ? ?Social History  ? ?Socioeconomic History  ? Marital status: Married  ?  Spouse name: Not on file  ? Number of children: 3  ? Years of education: Not on file  ? Highest education level: Not on file  ?Occupational History  ? Occupation: Retired  ?Tobacco Use  ? Smoking status: Former  ?  Packs/day: 1.00  ?  Years: 25.00  ?  Pack years: 25.00  ?  Types: Cigarettes  ?  Quit date: 04/25/1980  ?  Years since quitting: 41.3  ? Smokeless tobacco: Never  ?Vaping Use  ? Vaping Use: Never used  ?Substance and Sexual Activity  ? Alcohol use: No  ?  Alcohol/week: 0.0 standard drinks  ? Drug use: No  ? Sexual activity: Not on file  ?Other Topics Concern  ? Not on file  ?Social History Narrative  ? [employer] '56-'58, overseas to [place]  ? Some care through [place]  ? No service disability  ? ?Social Determinants of Health  ? ?Financial Resource Strain: Low Risk   ? Difficulty of Paying Living Expenses: Not hard at all  ?Food Insecurity: No Food Insecurity  ? Worried About Charity fundraiser in the Last Year: Never true  ? Ran Out of Food in the Last Year: Never true  ?Transportation Needs: No Transportation Needs  ? Lack of Transportation (Medical): No  ? Lack of Transportation (Non-Medical): No  ?Physical Activity: Inactive  ? Days of Exercise per Week: 0 days  ? Minutes of Exercise per Session: 0 min  ?Stress: No Stress Concern Present  ? Feeling of Stress : Not at all  ?Social Connections: Not on file  ?Intimate Partner Violence: Not At Risk  ? Fear of Current or Ex-Partner: No  ? Emotionally Abused: No  ? Physically Abused: No  ? Sexually Abused: No  ? ? ?Family History  ?Problem Relation Age of Onset  ? Cancer Mother   ? Stroke Neg Hx   ? ? ?Past Medical History:  ?Diagnosis Date  ? Anticoagulated on Coumadin   ? [facility] in [facility]RR at [facility]   ? Aortic stenosis   ? a. Echo 2/15: Severe LVH, EF 55-60%, Gr 1 DD, mild to mod AS (mean 20 mmHg, peak 31 mmHg), AVA 1.4-1.5 cm2, mild AI, MAC, trivial MR, LA upper limits of normal, RVSP 22 mmHg, mild RAE;   b. Echo 2/16:  EF 55-60%, no RWMA, mild AS (mean 17 mmHg, peak 27 mmHg), AVA 1.5 cm2, mild AI, MAC, mild LAE  ? Arthritis   ? Bladder neoplasm   ? Chronic cough   ? NO CARDIAC OR PULMONARY RELATED  ? Complication of anesthesia   ? Coronary artery disease CARDIOLOGIST-  DR KLEIN/ ALLRED  ? a. s/p CABG 2004, b. LHC with patent grafts 07/2005, ejection fraction 50%;  c. Myoview 2/15: no ischemia, EF 61%  ? Dry eyes   ? Dyslipidemia   ? GERD (gastroesophageal reflux disease)   ? HTN (hypertension)   ? Mild aortic stenosis   ? PAF (paroxysmal atrial fibrillation) ([place])   ?  CHADS2-VASc:  4  ? Paroxysmal atrial fibrillation (HCC)   ? PONV (postoperative nausea and vomiting)   ? From having surgery back in the 1950's  ? 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 a. Amiodarone Rx  ? S/P CABG x 5 2004  ? Stroke ([place]) 03/10/2020  ? Stroke in the Left eye  ? ? ?Patient Active Problem List  ? Diagnosis Date Noted  ? Aorta disorder ([place]) 08/25/2021  ? Advance care planning 10/28/2020  ? Right knee pain 09/21/2020  ? Traumatic leg ulcer ([place]) 08/14/2020  ? Bilateral pseudophakia 07/27/2020  ? Carpal tunnel syndrome 07/27/2020  ? Dermatochalasis 07/27/2020  ? Headache 07/27/2020  ? Hematuria 07/27/2020  ? Insomnia 07/27/2020  ? Postsurgical aortocoronary bypass status 07/27/2020  ? Pure hypercholesterolemia 07/27/2020  ? Radiculopathy 07/27/2020  ? Vitamin D deficiency 07/27/2020  ? Stenosis of internal carotid artery with cerebral infarction, right ([place]) 07/08/2020  ? Glaucoma (increased eye pressure) 06/19/2020  ? Stress reaction 06/19/2020  ? Imbalance 03/27/2020  ? Lightheadedness 03/27/2020  ? Retinal artery occlusion 03/10/2020  ? Constipation 12/22/2019  ? Right foot pain 09/11/2019  ?  Leg pain 06/23/2019  ? Muscle pain 04/04/2019  ? Laryngopharyngeal reflux (LPR) 05/22/2018  ? Rhinitis sicca 05/22/2018  ? Sciatica 12/28/2017  ? Neuropathy 10/07/2017  ? Foot swelling 10/07/2017  ? Shoulder

## 2021-09-07 DIAGNOSIS — R9089 Other abnormal findings on diagnostic imaging of central nervous system: Secondary | ICD-10-CM | POA: Insufficient documentation

## 2021-09-09 ENCOUNTER — Ambulatory Visit: Payer: Medicare Other | Admitting: Physician Assistant

## 2021-09-14 ENCOUNTER — Ambulatory Visit: Payer: Medicare Other | Admitting: Physical Therapy

## 2021-09-14 DIAGNOSIS — R42 Dizziness and giddiness: Secondary | ICD-10-CM | POA: Diagnosis not present

## 2021-09-14 DIAGNOSIS — R2681 Unsteadiness on feet: Secondary | ICD-10-CM | POA: Diagnosis not present

## 2021-09-14 DIAGNOSIS — R2689 Other abnormalities of gait and mobility: Secondary | ICD-10-CM | POA: Diagnosis not present

## 2021-09-14 NOTE — Therapy (Signed)
OUTPATIENT PHYSICAL THERAPY VESTIBULAR TREATMENT NOTE     Patient Name: [name] [name] MRN: [id number] DOB:06-Jan-1935, 87 y.o., male Today's Date: 09/15/2021  PCP: Dr. Elsie Stain REFERRING PROVIDER: Tonia Ghent   PT End of Session - 09/15/21 0803     Visit Number 2    Number of Visits 4    Date for PT Re-Evaluation 10/01/21    Authorization Type Medicare    Authorization Time Period 08-30-21 - 10-22-21    PT Start Time 0850    PT Stop Time 0930    PT Time Calculation (min) 40 min    Activity Tolerance Patient tolerated treatment well    Behavior During Therapy [facility] for tasks assessed/performed              Past Medical History:  Diagnosis Date   Anticoagulated on Coumadin    followed in CVRR at [facility]   Aortic stenosis    a. Echo 2/15: Severe LVH, EF 55-60%, Gr 1 DD, mild to mod AS (mean 20 mmHg, peak 31 mmHg), AVA 1.4-1.5 cm2, mild AI, MAC, trivial MR, LA upper limits of normal, RVSP 22 mmHg, mild RAE;   b. Echo 2/16:  EF 55-60%, no RWMA, mild AS (mean 17 mmHg, peak 27 mmHg), AVA 1.5 cm2, mild AI, MAC, mild LAE   Arthritis    Bladder neoplasm    Chronic cough    NO CARDIAC OR PULMONARY RELATED   Complication of anesthesia    Coronary artery disease CARDIOLOGIST-  DR KLEIN/ ALLRED   a. s/p CABG 2004, b. LHC with patent grafts 07/2005, ejection fraction 50%;  c. 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 Amiodarone Rx   S/P CABG x 5 2004   Stroke [facility]) 03/10/2020   Stroke in the Left eye   Past Surgical History:  Procedure Laterality Date   CARDIAC CATHETERIZATION   07-26-2005  DR GAMBLE   PRESERVED LVF/  EF 50%/ PATENT GRAFTS   CARDIAC CATHETERIZATION  03-04-2003  DR GAMBLE   SEVERE 3 VESSEL DISEASE   CARDIAC CATHETERIZATION  1991  DR [facility]   PAF/ FALSE POSITIVE STRESS TEST   CAROTID ENDARTERECTOMY Left 2022   CATARACT EXTRACTION W/ INTRAOCULAR LENS IMPLANT Right    CORONARY ARTERY BYPASS GRAFT  03-08-2003  DR [email or web address]   LIMA TO LAD/ SVG TO OM2/  SVG TO DIAGONAL / SVG TO PDA & PLA   CYSTOSCOPY WITH BIOPSY N/A 12/25/2012   Procedure: CYSTOSCOPY WITH BLADDER BIOPSY;  Surgeon: Fredricka Bonine, MD;  Location: [facility];  Service: Urology;  Laterality: N/A;   ELECTROPHYSIOLOGY STUDY  07-27-2005  DR Carleene Overlie TAYLOR   MAPPING --   RESULT NONINDUCIBLE VT OR SVT/  DX RIGHT VENTRICULAR OUTFLOW TRACT VT AND RULES OUT MORE MALIGNANT CAUSES OF VT   ENDARTERECTOMY Left 07/08/2020   Procedure: LEFT CAROTID ENDARTERECTOMY;  Surgeon: Serafina Mitchell, MD;  Location: [facility] OR;  Service: Vascular;  Laterality: Left;   INGUINAL HERNIA REPAIR Bilateral [place] ARTHROSCOPY Right  [place]   L4 -- L5   MOHS SURGERY     by Dr. Levada Dy 2019   REMOVAL VOCAL CORD POLYPS  1978   TONSILLECTOMY  AS CHILD   TRANSTHORACIC ECHOCARDIOGRAM  08/08/2011   MILD LVH/  EF 46-50%/  GRADE I DIASTOLIC DYSFUNCTION/ MILD AV STENOSIS   Patient Active Problem List   Diagnosis Date Noted   Abnormal finding on MRI of brain 09/07/2021   Aorta disorder ([place]) 08/25/2021   Advance care planning 10/28/2020   Right knee pain 09/21/2020   Traumatic leg ulcer ([place]) 08/14/2020   Bilateral pseudophakia 07/27/2020   Carpal tunnel syndrome 07/27/2020   Dermatochalasis 07/27/2020   Headache 07/27/2020   Hematuria 07/27/2020   Insomnia 07/27/2020   Postsurgical aortocoronary bypass status 07/27/2020   Pure hypercholesterolemia 07/27/2020   Radiculopathy 07/27/2020   Vitamin D deficiency 07/27/2020   Stenosis of internal carotid artery  with cerebral infarction, right ([place]) 07/08/2020   Glaucoma (increased eye pressure) 06/19/2020   Stress reaction 06/19/2020   Imbalance 03/27/2020   Lightheadedness 03/27/2020   Retinal artery occlusion 03/10/2020   Constipation 12/22/2019   Right foot pain 09/11/2019   Leg pain 06/23/2019   Muscle pain 04/04/2019   Laryngopharyngeal reflux (LPR) 05/22/2018   Rhinitis sicca 05/22/2018   Sciatica 12/28/2017   Neuropathy 10/07/2017   Foot swelling 10/07/2017   Shoulder pain 03/29/2017   CAD (coronary artery disease), autologous vein bypass graft 10/31/2016   Family history of colon cancer 10/31/2016   History of adenomatous polyp of colon 10/31/2016   Osteoarthritis 10/31/2016   Atrial fibrillation ([place]) 10/31/2016   Syncope 02/11/2016   High risk medication use    Skin lesion 06/04/2015   Lower back pain 05/01/2015   Elevated TSH 12/26/2014   Back pain 01/22/2014   Aortic stenosis 05/16/2013   Bladder neoplasm 11/23/2012   BCC (basal cell carcinoma of skin) 08/29/2012   Vertigo 08/24/2012   GERD (gastroesophageal reflux disease) 05/01/2012   Hyperlipidemia 03/14/2012   TMJ pain dysfunction syndrome 01/20/2012   Long term (current) use of anticoagulants 08/31/2011   RVOT ventricular tachycardia ([place])    Cough 04/29/2009   HEARING LOSS, SUDDEN 12/03/2007   Essential hypertension 02/22/2007   PAF (paroxysmal atrial fibrillation) ([place]) 02/22/2007    Rationale for Evaluation and Treatment Rehabilitation   ONSET DATE: mid- Feb. 2023  REFERRING DIAG: Vertigo  THERAPY DIAG:  Unsteadiness on feet  Dizziness and giddiness  SUBJECTIVE:   SUBJECTIVE STATEMENT: Pt states he worked in his yard picking up sticks and debris last week for about 20" -  was constantly bending over and standing up and was really dizzy afterward; pt states he is still feeling a little dizziness at this time from this activity Pt reports he has been doing the exercises but doesn't see any change in  his condition or balance at this time; says he did not do standing on pillows at home because it is so hard to keep his balance  Pt accompanied by: self  PERTINENT HISTORY: 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 Amiodarone Rx S/P CABG x 52004 Stroke (HCC)03/10/2020, Diplopia, Occipital CVA Nov. 2021   PAIN:  Are you having pain? No  PRECAUTIONS: Other: dizziness  WEIGHT BEARING RESTRICTIONS No  FALLS: Has patient fallen in last 6 months? No  LIVING ENVIRONMENT: Lives with: lives with their family and lives with their spouse Lives in: House/apartment  PLOF: Independent  PATIENT GOALS reduce the dizziness  OBJECTIVE:   GAIT: Gait pattern: WFL Distance walked: 100 Assistive device utilized: None Level of assistance: Modified independence Comments: pt reports dizziness during amb.     VESTIBULAR TREATMENT:  Gaze Adaptation: Pt performed x1 viewing in standing with feet apart - plain background- horizontal 30 secs x 2 reps; minimal c/o dizziness after 1st rep; pt able to allow dizziness to subside and then perform 2nd of 30 secs Vertical 60 secs x 1 rep in standing - minimal c/o dizziness provoked with this exercise  Standing Balance: Surface: Floor and Pillows Position: Feet Hip Width Apart and feet together Completed with: Eyes Open and Eyes Closed; Head Turns x 5 Reps and Head Nods x 5 Reps   Marching on pillows 10 reps with CGA to min assist for recovery of LOB   Single Leg Stance:   Surface: Floor  Lower Extremity: RLE and LLE  Time: attempted 10 secs - with UE support at counter  Standing with feet together on floor 10 secs with EC; added horizontal head turns 5 reps; vertical head turns 5 reps Standing with EC feet together with CGA with unsteadiness noted     PATIENT EDUCATION: Education details: x1 viewing in standing; standing on pillows with EO and EC - feet apart; [employer] added 09-14-21 [id number] Person educated:  Patient Education method: Explanation, Demonstration, and Handouts Education comprehension: verbalized understanding and returned demonstration   GOALS: Goals reviewed with patient? Yes   LONG TERM GOALS: Target date: 10/13/2021  Independent in HEP for balance and vestibular exercises.  Baseline:  Goal status: INITIAL  2.  Increase FOTO score to >/= 55/100 to demo improvement in dizziness. Baseline: 43/100 Goal status: INITIAL  3.  Pt will report at least 50% improvement in vertigo, I.e. intensity rating </= 4/10. Baseline: 7/10 Goal status: INITIAL   ASSESSMENT:  CLINICAL IMPRESSION: Pt has moderate difficulty maintaining balance on compliant surface with EC and greater difficulty maintaining balance with feet together with EC, indicative of decreased vestibular input in maintaining balance.  Pt able to perform x1viewing for 60 secs both with horizontal & vertical head turns with min. C/o increased dizziness.  Cont with POC.    OBJECTIVE IMPAIRMENTS decreased balance, difficulty walking, and dizziness.   ACTIVITY LIMITATIONS cleaning, community activity, driving, meal prep, laundry, and shopping.   PERSONAL FACTORS Age, Time since onset of injury/illness/exacerbation, and 1-2 comorbidities: h/o occipital CVA in Nov. 2021  are also affecting patient's functional outcome.    REHAB POTENTIAL: Good  CLINICAL DECISION MAKING: Evolving/moderate complexity  EVALUATION COMPLEXITY: Moderate   PLAN: PT FREQUENCY: 1x/week  PT DURATION: 4 weeks  PLANNED INTERVENTIONS: Therapeutic exercises, Therapeutic activity, Neuromuscular re-education, Balance training, Gait training, Patient/Family education, and Vestibular training  PLAN FOR NEXT SESSION: check HEP - added Romberg and SLS;  added standing on pillow with feet apart: amb. With head turns   Alda Lea, PT 09/15/2021, 8:06 AM

## 2021-09-15 ENCOUNTER — Encounter: Payer: Self-pay | Admitting: Physical Therapy

## 2021-09-16 DIAGNOSIS — M170 Bilateral primary osteoarthritis of knee: Secondary | ICD-10-CM | POA: Diagnosis not present

## 2021-09-21 NOTE — Progress Notes (Unsigned)
Cardiology Office Note Date:  09/21/2021  Patient ID:  [name] [name], [name] [name] 12/17/1934, MRN [id number] PCP:  Tonia Ghent, MD  Cardiologist:  Dr. Caryl Comes   Chief Complaint:  *** ??  History of Present Illness: [name] [name] is a 87 y.o. male with history of CAD (remote CABG), mild AS, VT (RVOT tx with amiodarone), PAFib, syncope (pust micturition), LBBB  He last saw Dr. Caryl Comes April 2019, he was doing well, no symptoms, no syncope.  No changes made.  Most recently saw A. Lynnell Jude, NP for pre-colonoscopy clearance. Doing well.  I saw him may 2021 He feels well.  No exertional intolerances, no formal exercise but gets out I the yard and so on.  NO CP, palpitations, no SOB, DOE.  No symptoms of PND or orthopnea.  NO dizzy spells, near syncope or syncope. No bleeding or signs of bleeding. Labs were updated and stable for his Eliquis, appropriately dosed Echo was updated w/LVEF 60-65%, mild conc LVH Mod reduced RV function (noted in 2017 as well), normal pulm pressures, mild MR, mild AI, mod AS (mead gradient 23) Ascending Ao mildly dilated 28m   I saw him 02/11/20 Patient called [place] his PM[place] office yesterday with dizziness, perhaps double vision/blurred vision and reports of 170/100 (prior to his HCTZ and dilt) it seems.  [facility], he was also given an appt to be seen here by our office. He reported compliance with his eliquis.  He mentions a number of symptoms/observations:  1. For a couple of week he has had some dizziness. This is very particularly when standing from a bent over or stooped position especially. He gives an example that a few days ago, last week was doing quite a lot of yard work, cutting back bushes and cleaning out debris from under them, a lot of raking and clearing.  When he would stand up from being under the bushes and clearing things he would feel a bit dizzy or off balance for a few moments. He does  say that for years, when he first gets up in the morning or after seated for a long time he will sit up for a 30 seconds or so with h/u of getting a lilttle lightheaded with standing. August he mentioned this awas a little more then usual yo his PMD and his HCTZ was reduced to 1/2 tab daily (12.511m This dizziness perhaps a bit different with more of an off balance or motion sensation then overtly lightheaded. He does NOT feel like he is going to faint   He reports 100% compliance with his Eliquis, no missed doses, he chronically has headaches, none unusual or particularly bad/worse then ususal  2. Sunday he woke in the middle of the night as usual for the bathroom, sat on the edge of the bed and felt like he was having a hard timie focusing on the clock numbers (lighted digital) and took a while to clear up.  He went to the bathroom and back to bed without difficulty, though felt a little like he was unbalanced or "off" and perhaps a little heavy in his belly without overt nauseous once back to bed. He did not feel like he was stumblig or having gait difficulty  3. He has noticed of late when seated in his recliner he is napping more then usual, but does not feel overtly fatigued in any way otherwise  4. He has been using  psyllium for his constipation and dizziness is mentioned so he has reduced it to QOD with some improvement in his symptoms.  5. Yesterday AM his BP unusually elevated       171/100,99bpm      156/100, 105bpm (after meds)       Afternoon 131/76, 76bpm and feeling better       He resumed his full tab of HCTZ     TODAY 149/88, 74bpm                   1156/79, 84bpm (after medicines)    I suspected more of a vertigo sounding constellation of symptoms and planned to try meclizine and advised him to follow up with his PMD for any further evaluation neuro w/u, imaging etc. His BP cuff was checked felt to be accurate.   He subsequently saw his PMD, reported he did not think he  needed neuro w/u unless recurrent but [facility] seemed more worried and did order for him to wear a cardiac monitor.  Telehealth visit 03/09/20 He reports feeling over all improved. He has noticed that morning is when he notes that he feels a bit "off", or dizzy, perhaps a jittery feeling, or unsteady a few hours into his day he starts to feel better and remains well again until AM time. No near syncope or syncope, no falls. No CP, palpitations He completed the 2 week monitor for [facility] and returned it Friday last week. He tried 4-6 doses of the meclizine and did no think it made any difference. He has taken his BP and HR BID at home since his last visit ALL sound quite good, generally 120's-130's/80's-90 and HRs 60's-90's He has not had any recurrent visual changes He sees ENT Dec 3rd No changes were made, seemed to have narrowed his symptoms to mornings particularly, was going to f/u with the [facility] MD on his monitor findings and make [place] aware  03/10/20 he was sent to the ER by his eye MD and admitted to the ER with L eye blurred vision  Neurology noted: Ophthalmologist saw [name] [name] plaque in the branch of the retinal artery.  CTA head and neck no high-grade stenosis, however left ICA bulb calcified plaque with soft plaque versus plaque ulceration, indicating high risk plaque.  MRI no acute infarct.  EF 55 to 60%.  LDL 85 and A1c 5.6. Etiology of patient's symptoms likely due to left ICA unstable plaque without high-grade stenosis.  Dr. Trula Slade from vascular surgery saw patient and agree with high risk plaque, plan to have left CEA in the near future.  Recommend to continue Eliquis and also add on aspirin 81.  Continue pravastatin.   I saw him 03/31/20 He is doing OK.  It a bit anxious and nervous about the prospect of having the vascular procedure, at the same time, wishes he could get in to see Dr. Trula Slade sooner and discuss further, and if the procedure is decided upon to go ahead and get it  behind him. He has not had any recurrent symptoms.Remains with L eye visual deficit. No CP, palpitations, no SOB or DOE. Is quite active, yard work and so on without difficulties. He still feels in the morning a little "off" though clears and feels well otherwise. No near syncope or syncope. He is on ASA and Eliquis now, no bleeding or signs of bleeding. Unclear what his dizziness was, asked to check his BP  with this symptom, and d/w neurology PVC burden  was low No changes were made  He saw Dr. Caryl Comes, his last visit with [place] on 07/27/20, no bleeding on Eliquis,/ASA regime. He d/w neuro/vascular both recommended continued dual therapy. Discussed mention of SOB with stairs, ?angina equivalent, planned to monitor this  Feb 2023, chart messages re bleeding eye  Neurology note: 08/16/21, for an abnormal MRI with a number of differential discussed, stroke +/- nonspecific and differential diagnosis includes primary CNS lymphoma versus secondary lymphoma versus metastatic disease versus postinfectious process versus less likely inflammatory process such as sarcoid, demyelinating disease or sarcoidosis. >>> labs/ w/u seemed negative and planned for f/u MRI in 2-3 months  08/23/21 saw PMD: follow up in place for an incidental ascending AO aneurysm  4.2cm, PT for balance  issues, diazepam for vertigo,     *** rate *** syncope? *** CAD meds, labs, lipids... *** neuro f/u, ? Vertigo by IM,  *** eye? *** symptoms *** would he like a general cardiologist? *** has an utd EKG   AF history: Initial diagnosis 1982 Amiodarone +/- 7 years (also with h/o RVOT VT) stopped June 2018 >> permanent AF    Past Medical History:  Diagnosis Date   Anticoagulated on Coumadin    followed in [facility] at [facility]   Aortic stenosis    a. Echo 2/15: Severe LVH, EF 55-60%, Gr 1 DD, mild to mod AS (mean 20 mmHg, peak 31 mmHg), AVA 1.4-1.5 cm2, mild AI, MAC, trivial MR, LA upper limits of normal, RVSP 22 mmHg, mild  RAE;   b. Echo 2/16:  EF 55-60%, no RWMA, mild AS (mean 17 mmHg, peak 27 mmHg), AVA 1.5 cm2, mild AI, MAC, mild LAE   Arthritis    Bladder neoplasm    Chronic cough    NO CARDIAC OR PULMONARY RELATED   Complication of anesthesia    Coronary artery disease CARDIOLOGIST-  DR KLEIN/ [facility]RED   a. s/p CABG 2004, b. LHC with patent grafts 07/2005, ejection fraction 50%;  c. Myoview 2/15: no ischemia, EF 61%   Dry eyes    Dyslipidemia    GERD (gastroesophageal reflux disease)    HTN (hypertension)    Mild aortic stenosis    PAF (paroxysmal atrial fibrillation) (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    Past Surgical History:  Procedure Laterality Date   CARDIAC CATHETERIZATION  07-26-2005  DR GAMBLE   PRESERVED LVF/  EF 50%/ PATENT GRAFTS   CARDIAC CATHETERIZATION  03-04-2003  DR GAMBLE   SEVERE 3 VESSEL DISEASE   CARDIAC CATHETERIZATION  1991  DR [facility]   PAF/ FALSE POSITIVE STRESS TEST   CAROTID ENDARTERECTOMY Left 2022   CATARACT EXTRACTION W/ INTRAOCULAR LENS IMPLANT Right    CORONARY ARTERY BYPASS GRAFT  03-08-2003  DR [id number]   LIMA TO LAD/ SVG TO OM2/  SVG TO DIAGONAL / SVG TO PDA & PLA   CYSTOSCOPY WITH BIOPSY N/A 12/25/2012   Procedure: CYSTOSCOPY WITH BLADDER BIOPSY;  Surgeon: Fredricka Bonine, MD;  Location: [facility];  Service: Urology;  Laterality: N/A;   ELECTROPHYSIOLOGY STUDY  07-27-2005  DR Cristopher Peru   MAPPING --   RESULT NONINDUCIBLE  VT OR SVT/  DX RIGHT VENTRICULAR OUTFLOW TRACT VT AND RULES OUT MORE MALIGNANT CAUSES OF VT   ENDARTERECTOMY Left 07/08/2020   Procedure: LEFT CAROTID ENDARTERECTOMY;  Surgeon: Serafina Mitchell, MD;  Location: [facility] OR;  Service: Vascular;   Laterality: Left;   INGUINAL HERNIA REPAIR Bilateral [place] ARTHROSCOPY Right 1994   LUMBAR DISC SURGERY  1999   L4 -- L5   MOHS SURGERY     by Dr. Levada Dy 2019   REMOVAL VOCAL CORD POLYPS  1978   TONSILLECTOMY  AS CHILD   TRANSTHORACIC ECHOCARDIOGRAM  08/08/2011   MILD LVH/  EF 82-42%/  GRADE I DIASTOLIC DYSFUNCTION/ MILD AV STENOSIS    Current Outpatient Medications  Medication Sig Dispense Refill   meclizine (ANTIVERT) 25 MG tablet Take 0.5-1 tablets (12.5-25 mg total) by mouth 3 (three) times daily as needed for dizziness. 30 tablet 2   acetaminophen (TYLENOL) 325 MG tablet Take 650 mg by mouth once as needed for moderate pain or headache.     apixaban (ELIQUIS) 5 MG TABS tablet Take 1 tablet (5 mg total) by mouth 2 (two) times daily. Take evening dose starting 07/09/20 180 tablet 3   brimonidine (ALPHAGAN) 0.2 % ophthalmic solution Place into the left eye 2 (two) times daily.     butalbital-acetaminophen-caffeine (FIORICET, ESGIC) 50-325-40 MG per tablet Take 1 tablet by mouth 3 (three) times daily as needed for migraine.      diltiazem (CARDIZEM CD) 120 MG 24 hr capsule Take 1 capsule (120 mg) by mouth once daily     fluticasone (FLONASE) 50 MCG/ACT nasal spray Place 1 spray into both nostrils daily. 16 g 1   hydrochlorothiazide (HYDRODIURIL) 25 MG tablet TAKE 1 TABLET BY MOUTH DAILY 90 tablet 0   latanoprost (XALATAN) 0.005 % ophthalmic solution INSTILL 1 DROP IN BOTH EYES AT BEDTIME -  CONTACT PHARMACY WHEN NEW SUPPLY IS NEEDED     loratadine (CLARITIN) 10 MG tablet Take 10 mg by mouth daily.     melatonin 3 MG TABS tablet Take 3 mg by mouth at bedtime.     Multiple Vitamins-Minerals (MULTIVITAMIN WITH MINERALS) tablet Take 1 tablet by mouth daily.     Polyethyl Glycol-Propyl Glycol (SYSTANE ULTRA OP) Apply 1-2 drops to eye once as needed (dry eyes.).      potassium chloride (K-DUR) 10 MEQ tablet Take 10 mEq by mouth every morning. Take as directed     pravastatin  (PRAVACHOL) 80 MG tablet Take 1 tablet (80 mg total) by mouth every evening. 90 tablet 0   No current facility-administered medications for this visit.    Allergies:   Ace inhibitors, Zocor [simvastatin], Nsaids, Prednisone, Requip [ropinirole], Cephalexin, Chocolate, Doxycycline, Gabapentin, Hycodan [hydrocodone bit-homatrop mbr], and Oxycodone   Social History:  The patient  reports that he quit smoking about 41 years ago. His smoking use included cigarettes. He has a 25.00 pack-year smoking history. He has never used smokeless tobacco. He reports that he does not drink alcohol and does not use drugs.   Family History:  The patient's family history includes Cancer in his mother.  ROS:  Please see the history of present illness.    All other systems are reviewed and otherwise negative.   PHYSICAL EXAM:  VS:  There were no vitals taken for this visit. BMI: There is no height or weight on file to calculate BMI. Well nourished, well developed, in no acute distress  HEENT: normocephalic, atraumatic  Neck: no JVD, carotid bruits or masses Cardiac:  *** irreg-irreg, soft SM, no rubs, or gallops Lungs:  *** CTA b/l, no wheezing, rhonchi or rales  Abd: soft, nontender, MS: no deformity, age appropriate atrophy Ext: *** no edema, palp pedal pulses b/l Skin: warm and dry, no rash Neuro:  No gross focal deficits are appreciated Psych: euthymic mood, full affect     EKG:   Not done today   Echocardiogram Complete 03/11/20  1. Left ventricular ejection fraction, by estimation, is 55 to 60%. The  left ventricle has normal function. The left ventricle has no regional  wall motion abnormalities. There is mild concentric left ventricular  hypertrophy. Left ventricular diastolic  parameters are indeterminate.   2. Right ventricular systolic function is moderately reduced. The right  ventricular size is mildly enlarged. There is moderately elevated  pulmonary artery systolic pressure. The  estimated right ventricular  systolic pressure is 85.2 mmHg.   3. Left atrial size was moderately dilated.   4. The mitral valve is grossly normal. No evidence of mitral valve  regurgitation. Moderate mitral annular calcification.   5. Tricuspid valve regurgitation is mild to moderate.   6. The aortic valve is calcified. There is severe calcifcation of the  aortic valve. Aortic valve regurgitation is moderate. Mild to moderate  aortic valve stenosis.   7. There is dilatation of the ascending aorta, measuring 42 mm.   8. The inferior vena cava is dilated in size with <50% respiratory  variability, suggesting right atrial pressure of 15 mmHg.    Comparison(s): A prior study was performed on 09/25/19. Compared to prior:  stable aortic valve regurgitation and stenosis, stable ascending aortic  size, stable RV function, stable mitral calcifications, slight increase in  tricuspid regurgitation.   He brings his monitor results from the [place], letter is dated 03/24/20 Max HR 200 Min 45 avg 78 V ectopy: isolated <1% Couplet <1% Triplet <1% One episode 4 beats, max rate 200 Impression Underlying rhythm is atrial fibrillatin with good heart rate control as detailed above No high-grade AV block or prolonged pauses Triggered events correspond to rate controlled atrial fibrillation with isolated PVCs  09/25/2019: TTE IMPRESSIONS  1. Left ventricular ejection fraction, by estimation, is 60 to 65%. The  left ventricle has normal function. The left ventricle has no regional  wall motion abnormalities. There is mild concentric left ventricular  hypertrophy. Left ventricular diastolic  parameters are indeterminate.   2. Right ventricular systolic function is moderately reduced. The right  ventricular size is mildly enlarged. There is normal pulmonary artery  systolic pressure. The estimated right ventricular systolic pressure is  77.8 mmHg.   3. The mitral valve is normal in structure. Mild mitral  valve  regurgitation. No evidence of mitral stenosis.   4. The aortic valve is tricuspid. Aortic valve regurgitation is mild to  moderate. Moderate aortic valve stenosis. Aortic valve area, by VTI  measures 1.19 cm. Aortic valve mean gradient measures 23.0 mmHg. Aortic  valve Vmax measures 2.99 m/s.   5. Aortic dilatation noted. There is mild dilatation of the ascending  aorta measuring 43 mm.   6. The inferior vena cava is normal in size with greater than 50%  respiratory variability, suggesting right atrial pressure of 3 mmHg.   7. Compared to study of 10/19/2016, the mean AVG has increased from 71mHg  to 268mg, DI decreased from 0.29 to 0.25, AVA has remained about the  same.    10/19/2016: TTE Study Conclusions  -  Left ventricle: Wall thickness was increased in a pattern of mild    LVH. Systolic function was normal. The estimated ejection    fraction was in the range of 55% to 60%. Left ventricular    diastolic function parameters were normal.  - Aortic valve: MIld to moderate AS gradients stable since October    2017 There was mild to moderate stenosis. There was mild    regurgitation. Valve area (VTI): 1.02 cm^2. Valve area (Vmax):    0.98 cm^2. Valve area (Vmean): 0.89 cm^2.  - Mitral valve: Calcified annulus.  - Atrial septum: No defect or patent foramen ovale was identified.    2/5/20915: stress myoview Impression Exercise Capacity:  Fair exercise capacity. BP Response:  Normal blood pressure response. Clinical Symptoms:  No chest pain or dyspnea. ECG Impression:  No significant ST segment change suggestive of ischemia. Comparison with Prior Nuclear Study: No images to compare   Overall Impression:  Normal stress nuclear study.   LV Ejection Fraction: 61%.  LV Wall Motion:  NL LV Function; NL Wall Motion   Recent Labs: 06/13/2021: ALT 11; BUN 12; Creatinine, Ser 0.91; Hemoglobin 13.8; Magnesium 2.2; Platelets 216; Potassium 4.1; Sodium 137 08/05/2021: TSH 3.88   11/23/2020: Cholesterol 157; HDL 63.80; LDL Cholesterol 80; Total CHOL/HDL Ratio 2; Triglycerides 66.0; VLDL 13.2   CrCl cannot be calculated (Patient's most recent lab result is older than the maximum 21 days allowed.).   Wt Readings from Last 3 Encounters:  08/23/21 192 lb (87.1 kg)  07/21/21 194 lb (88 kg)  06/22/21 191 lb (86.6 kg)     Other studies reviewed: Additional studies/records reviewed today include: summarized above  ASSESSMENT AND PLAN:   1. Permanent AFib     CHA2DS2Vasc is at least 4, on Eliquis, *** appropriately dosed     *** No palpitations or cardiac awareness, rate controlled      2. CAD (CABG)     *** stable without symptoms   3. HTN     *** Reports much better at home  4. VHD, mild-mod AS     *** Stable by his echo  5. Syncope     *** Post mictiration     Not recurrent  6. Dizziness     *** Still unclear what this is, I have asked him to check his BP in the AM when he notes the symptom     He saw ENT, not felt an inner ear issue     To discuss with neurology further 7. L eye visual deficit     ***  upper visual field     retinal artery branch occl     soft carotid plaque     vascular follow up scheduled for jan, sees neurology next week  8. Hx of VT     ***    Disposition:  Will have him see Dr. Caryl Comes at his next available appointment     Current medicines are reviewed at length with the patient today.   Haywood Lasso, PA-C 09/21/2021 7:47 AM     [address] [place] [address] [phone] (office)  [phone] (fax)

## 2021-09-22 ENCOUNTER — Encounter: Payer: Self-pay | Admitting: Physician Assistant

## 2021-09-22 ENCOUNTER — Ambulatory Visit (INDEPENDENT_AMBULATORY_CARE_PROVIDER_SITE_OTHER): Payer: Medicare Other | Admitting: Physician Assistant

## 2021-09-22 VITALS — BP 138/72 | HR 68 | Resp 16 | Ht 70.0 in | Wt 192.2 lb

## 2021-09-22 DIAGNOSIS — I639 Cerebral infarction, unspecified: Secondary | ICD-10-CM | POA: Diagnosis not present

## 2021-09-22 DIAGNOSIS — I2510 Atherosclerotic heart disease of native coronary artery without angina pectoris: Secondary | ICD-10-CM | POA: Diagnosis not present

## 2021-09-22 DIAGNOSIS — I4821 Permanent atrial fibrillation: Secondary | ICD-10-CM | POA: Diagnosis not present

## 2021-09-22 DIAGNOSIS — I10 Essential (primary) hypertension: Secondary | ICD-10-CM

## 2021-09-22 NOTE — Patient Instructions (Signed)
Medication Instructions:   Your physician recommends that you continue on your current medications as directed. Please refer to the Current Medication list given to you today.  *If you need a refill on your cardiac medications before your next appointment, please call your pharmacy*   Lab Work: [place]   If you have labs (blood work) drawn today and your tests are completely normal, you will receive your results only by: [place] (if you have MyChart) OR A paper copy in the mail If you have any lab test that is abnormal or we need to change your treatment, we will call you to review the results.   Testing/Procedures: NONE ORDERED  TODAY   Follow-Up: At [facility], you and your health needs are our priority.  As part of our continuing mission to provide you with exceptional heart care, we have created designated Provider Care Teams.  These Care Teams include your primary Cardiologist (physician) and Advanced Practice Providers (APPs -  Physician Assistants and Nurse Practitioners) who all work together to provide you with the care you need, when you need it.  We recommend signing up for the patient portal called "MyChart".  Sign up information is provided on this After Visit Summary.  MyChart is used to connect with patients for Virtual Visits (Telemedicine).  Patients are able to view lab/test results, encounter notes, upcoming appointments, etc.  Non-urgent messages can be sent to your provider as well.   To learn more about what you can do with MyChart, go to [email or web address].    Your next appointment:    6 month(s)  The format for your next appointment:   In Person  Provider:   Tommye Standard, PA-C{    Other Instructions   Important Information About Sugar

## 2021-09-28 ENCOUNTER — Encounter: Payer: Self-pay | Admitting: Nurse Practitioner

## 2021-09-28 ENCOUNTER — Ambulatory Visit (INDEPENDENT_AMBULATORY_CARE_PROVIDER_SITE_OTHER): Payer: Medicare Other | Admitting: Nurse Practitioner

## 2021-09-28 VITALS — BP 108/76 | HR 74 | Temp 97.8°F | Resp 14 | Wt 191.1 lb

## 2021-09-28 DIAGNOSIS — R7989 Other specified abnormal findings of blood chemistry: Secondary | ICD-10-CM | POA: Diagnosis not present

## 2021-09-28 DIAGNOSIS — W57XXXA Bitten or stung by nonvenomous insect and other nonvenomous arthropods, initial encounter: Secondary | ICD-10-CM

## 2021-09-28 DIAGNOSIS — S30861A Insect bite (nonvenomous) of abdominal wall, initial encounter: Secondary | ICD-10-CM

## 2021-09-28 DIAGNOSIS — M79605 Pain in left leg: Secondary | ICD-10-CM | POA: Insufficient documentation

## 2021-09-28 DIAGNOSIS — M79604 Pain in right leg: Secondary | ICD-10-CM | POA: Diagnosis not present

## 2021-09-28 LAB — CK: Total CK: 63 U/L (ref 7–232)

## 2021-09-28 LAB — IBC + FERRITIN
Ferritin: 39.1 ng/mL (ref 22.0–322.0)
Iron: 114 ug/dL (ref 42–165)
Saturation Ratios: 35.4 % (ref 20.0–50.0)
TIBC: 322 ug/dL (ref 250.0–450.0)
Transferrin: 230 mg/dL (ref 212.0–360.0)

## 2021-09-28 NOTE — Assessment & Plan Note (Signed)
Patient experiencing aching bilateral thigh pain.  Not necessarily correlated with activity but can exacerbate with activity.  We will check Lyme titer along with CK and iron studies he has tried Requip without relief.  Has been on statin for extended period of time has changed in the past but could not tolerate other statins low likelihood this is the culprit.  Differential diagnosis include intermittent claudication versus neurogenic claudication versus muscle breakdown and low iron.

## 2021-09-28 NOTE — Patient Instructions (Signed)
Nice to see you today I will be in touch with the lab results once I have them Follow up if no improvement  You can use over the counter hydrocortisone cream to the spot on the side

## 2021-09-28 NOTE — Assessment & Plan Note (Signed)
Presumed tick bite to the right lower abdomen.  Does not look infected at current time-approximately 3 weeks ago we will draw a Lyme titer along with other labs pending results.

## 2021-09-28 NOTE — Progress Notes (Signed)
Acute Office Visit  Subjective:     Patient ID: [name] [name], male    DOB: [birth year], 87 y.o.   MRN: [id number]  Chief Complaint  Patient presents with   Insect Bite    Noticed some kind of a bite on the right side of the abdomen/side, started about 3 weeks ago, Has had itching and stinging sensation. Has had hips/lthigs ache    HPI Patient is in today for bite  Noticed it approx 3 weeks ago and has improved in size. States that it is still red and itching. States that he did try a needle to probe it and nothing came out besides blood. Stats that he is concerned about tick borne illness. States he had a virtual with PCP and was given Requip and did not help with the pain.  He complains of bilateral thigh pain that requires massaging to make feel better or worse at night.  Has been on pravastatin 80 mg for an extended period of time states his cardiologist has tried him on other statins and could not tolerate them.  He does correlate this with the by on his right lower abdomen.  Review of Systems  Constitutional:  Negative for chills and fever.  Musculoskeletal:  Positive for myalgias.  Skin:  Positive for itching. Negative for rash.       "+" lesion  Neurological:  Negative for tingling.       Objective:    BP 108/76   Pulse 74   Temp 97.8 F (36.6 C)   Resp 14   Wt 191 lb 1 oz (86.7 kg)   SpO2 98%   BMI 27.41 kg/m     Physical Exam Vitals and nursing note reviewed.  Constitutional:      Appearance: Normal appearance.  Cardiovascular:     Rate and Rhythm: Normal rate and regular rhythm.     Pulses:          Dorsalis pedis pulses are 1+ on the right side and 1+ on the left side.     Heart sounds: Normal heart sounds.  Pulmonary:     Breath sounds: Normal breath sounds.  Musculoskeletal:     Lumbar back: No tenderness or bony tenderness.  Skin:    General: Skin is warm.     Findings: Lesion present.     Comments: See clinical photo.  Lesion is macular  and scaly in nature.  No warmth to palpation no tenderness to palpation.  Approximately the size of a pea  Neurological:     Mental Status: He is alert.     No results found for any visits on 09/28/21.      Assessment & Plan:   Problem List Items Addressed This Visit       Musculoskeletal and Integument   Tick bite of abdominal wall - Primary    Presumed tick bite to the right lower abdomen.  Does not look infected at current time-approximately 3 weeks ago we will draw a Lyme titer along with other labs pending results.       Relevant Orders   B. Burgdorfi Antibodies     Other   Abnormal CBC   Relevant Orders   IBC + Ferritin   Bilateral leg pain    Patient experiencing aching bilateral thigh pain.  Not necessarily correlated with activity but can exacerbate with activity.  We will check Lyme titer along with CK and iron studies he has tried Requip without relief.  Has been  on statin for extended period of time has changed in the past but could not tolerate other statins low likelihood this is the culprit.  Differential diagnosis include intermittent claudication versus neurogenic claudication versus muscle breakdown and low iron.       Relevant Orders   IBC + Ferritin   CK    No orders of the defined types were placed in this encounter.   Return if symptoms worsen or fail to improve.  Romilda Garret, NP

## 2021-09-29 DIAGNOSIS — H40052 Ocular hypertension, left eye: Secondary | ICD-10-CM | POA: Diagnosis not present

## 2021-09-29 DIAGNOSIS — H0102B Squamous blepharitis left eye, upper and lower eyelids: Secondary | ICD-10-CM | POA: Diagnosis not present

## 2021-09-29 DIAGNOSIS — Z961 Presence of intraocular lens: Secondary | ICD-10-CM | POA: Diagnosis not present

## 2021-09-29 DIAGNOSIS — H16223 Keratoconjunctivitis sicca, not specified as Sjogren's, bilateral: Secondary | ICD-10-CM | POA: Diagnosis not present

## 2021-09-29 DIAGNOSIS — H0102A Squamous blepharitis right eye, upper and lower eyelids: Secondary | ICD-10-CM | POA: Diagnosis not present

## 2021-09-29 DIAGNOSIS — H532 Diplopia: Secondary | ICD-10-CM | POA: Diagnosis not present

## 2021-09-29 DIAGNOSIS — H401112 Primary open-angle glaucoma, right eye, moderate stage: Secondary | ICD-10-CM | POA: Diagnosis not present

## 2021-09-29 LAB — B. BURGDORFI ANTIBODIES: B burgdorferi Ab IgG+IgM: <0.9 index

## 2021-09-30 ENCOUNTER — Encounter (INDEPENDENT_AMBULATORY_CARE_PROVIDER_SITE_OTHER): Payer: Medicare Other | Admitting: Ophthalmology

## 2021-09-30 DIAGNOSIS — H43813 Vitreous degeneration, bilateral: Secondary | ICD-10-CM | POA: Diagnosis not present

## 2021-09-30 DIAGNOSIS — H34832 Tributary (branch) retinal vein occlusion, left eye, with macular edema: Secondary | ICD-10-CM

## 2021-09-30 DIAGNOSIS — I10 Essential (primary) hypertension: Secondary | ICD-10-CM | POA: Diagnosis not present

## 2021-09-30 DIAGNOSIS — H35033 Hypertensive retinopathy, bilateral: Secondary | ICD-10-CM | POA: Diagnosis not present

## 2021-09-30 DIAGNOSIS — H34232 Retinal artery branch occlusion, left eye: Secondary | ICD-10-CM | POA: Diagnosis not present

## 2021-10-04 ENCOUNTER — Encounter (INDEPENDENT_AMBULATORY_CARE_PROVIDER_SITE_OTHER): Payer: Medicare Other | Admitting: Ophthalmology

## 2021-10-04 DIAGNOSIS — H34232 Retinal artery branch occlusion, left eye: Secondary | ICD-10-CM

## 2021-10-04 DIAGNOSIS — H43812 Vitreous degeneration, left eye: Secondary | ICD-10-CM

## 2021-10-04 DIAGNOSIS — H34832 Tributary (branch) retinal vein occlusion, left eye, with macular edema: Secondary | ICD-10-CM | POA: Diagnosis not present

## 2021-10-04 DIAGNOSIS — I10 Essential (primary) hypertension: Secondary | ICD-10-CM | POA: Diagnosis not present

## 2021-10-04 DIAGNOSIS — H35032 Hypertensive retinopathy, left eye: Secondary | ICD-10-CM

## 2021-10-07 DIAGNOSIS — M1711 Unilateral primary osteoarthritis, right knee: Secondary | ICD-10-CM | POA: Diagnosis not present

## 2021-10-11 ENCOUNTER — Ambulatory Visit: Payer: Medicare Other | Attending: Family Medicine | Admitting: Physical Therapy

## 2021-10-11 ENCOUNTER — Encounter: Payer: Self-pay | Admitting: Physical Therapy

## 2021-10-11 VITALS — BP 134/82 | HR 90

## 2021-10-11 DIAGNOSIS — R42 Dizziness and giddiness: Secondary | ICD-10-CM | POA: Insufficient documentation

## 2021-10-11 DIAGNOSIS — R2681 Unsteadiness on feet: Secondary | ICD-10-CM | POA: Insufficient documentation

## 2021-10-11 NOTE — Therapy (Signed)
OUTPATIENT PHYSICAL THERAPY VESTIBULAR TREATMENT NOTE     Patient Name: [name] [name] MRN: [id number] DOB:[birth year], 87 y.o., male Today's Date: 10/11/2021  PCP: Dr. Elsie Stain REFERRING PROVIDER: Tonia Ghent   PT End of Session - 10/11/21 1721     Visit Number 3    Number of Visits 4    Date for PT Re-Evaluation 10/01/21    Authorization Type Medicare    Authorization Time Period 08-30-21 - 10-22-21    PT Start Time 1105    PT Stop Time 1148    PT Time Calculation (min) 43 min    Activity Tolerance Patient tolerated treatment well    Behavior During Therapy [facility] for tasks assessed/performed               Past Medical History:  Diagnosis Date   Anticoagulated on Coumadin    followed in [facility] at [facility]   Aortic stenosis    a. Echo 2/15: Severe LVH, EF 55-60%, Gr 1 DD, mild to mod AS (mean 20 mmHg, peak 31 mmHg), AVA 1.4-1.5 cm2, mild AI, MAC, trivial MR, LA upper limits of normal, RVSP 22 mmHg, mild RAE;   b. Echo 2/16:  EF 55-60%, no RWMA, mild AS (mean 17 mmHg, peak 27 mmHg), AVA 1.5 cm2, mild AI, MAC, mild LAE   Arthritis    Bladder neoplasm    Chronic cough    NO CARDIAC OR PULMONARY RELATED   Complication of anesthesia    Coronary artery disease CARDIOLOGIST-  DR KLEIN/ ALLRED   a. s/p CABG 2004, b. LHC with patent grafts 07/2005, ejection fraction 50%;  c. 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 Amiodarone Rx   S/P CABG x 5 2004   Stroke [facility]) 03/10/2020   Stroke in the Left eye   Past Surgical History:  Procedure Laterality Date   CARDIAC CATHETERIZATION   07-26-2005  DR GAMBLE   PRESERVED LVF/  EF 50%/ PATENT GRAFTS   CARDIAC CATHETERIZATION  03-04-2003  DR GAMBLE   SEVERE 3 VESSEL DISEASE   CARDIAC CATHETERIZATION  1991  DR [facility]   PAF/ FALSE POSITIVE STRESS TEST   CAROTID ENDARTERECTOMY Left 2022   CATARACT EXTRACTION W/ INTRAOCULAR LENS IMPLANT Right    CORONARY ARTERY BYPASS GRAFT  03-08-2003  DR LEXNTZGY   LIMA TO LAD/ SVG TO OM2/  SVG TO DIAGONAL / SVG TO PDA & PLA   CYSTOSCOPY WITH BIOPSY N/A 12/25/2012   Procedure: CYSTOSCOPY WITH BLADDER BIOPSY;  Surgeon: Fredricka Bonine, MD;  [facility];  Service: Urology;  Laterality: N/A;   ELECTROPHYSIOLOGY STUDY  07-27-2005  DR Carleene Overlie TAYLOR   MAPPING --   RESULT NONINDUCIBLE VT OR SVT/  DX RIGHT VENTRICULAR OUTFLOW TRACT VT AND RULES OUT MORE MALIGNANT CAUSES OF VT   ENDARTERECTOMY Left 07/08/2020   Procedure: LEFT CAROTID ENDARTERECTOMY;  Surgeon: Serafina Mitchell, MD;  Location: [facility] OR;  Service: Vascular;  Laterality: Left;   INGUINAL HERNIA REPAIR Bilateral [place]  Right [place]   L4 -- L5   MOHS SURGERY     by Dr. Levada Dy 2019   REMOVAL VOCAL CORD POLYPS  1978   TONSILLECTOMY  AS CHILD   TRANSTHORACIC ECHOCARDIOGRAM  08/08/2011   MILD LVH/  EF 85-02%/  GRADE I DIASTOLIC DYSFUNCTION/ MILD AV STENOSIS   Patient Active Problem List   Diagnosis Date Noted   Abnormal CBC 09/28/2021   Bilateral leg pain 09/28/2021   Tick bite of abdominal wall 09/28/2021   Abnormal finding on MRI of brain 09/07/2021   Aorta disorder ([place]) 08/25/2021   Advance care planning 10/28/2020   Right knee pain 09/21/2020   Traumatic leg ulcer ([place]) 08/14/2020   Bilateral pseudophakia 07/27/2020   Carpal tunnel syndrome 07/27/2020   Dermatochalasis 07/27/2020   Headache 07/27/2020   Hematuria 07/27/2020   Insomnia 07/27/2020   Postsurgical aortocoronary bypass status 07/27/2020   Pure hypercholesterolemia 07/27/2020    Radiculopathy 07/27/2020   Vitamin D deficiency 07/27/2020   Stenosis of internal carotid artery with cerebral infarction, right ([place]) 07/08/2020   Glaucoma (increased eye pressure) 06/19/2020   Stress reaction 06/19/2020   Imbalance 03/27/2020   Lightheadedness 03/27/2020   Retinal artery occlusion 03/10/2020   Constipation 12/22/2019   Right foot pain 09/11/2019   Leg pain 06/23/2019   Muscle pain 04/04/2019   Laryngopharyngeal reflux (LPR) 05/22/2018   Rhinitis sicca 05/22/2018   Sciatica 12/28/2017   Neuropathy 10/07/2017   Foot swelling 10/07/2017   Shoulder pain 03/29/2017   CAD (coronary artery disease), autologous vein bypass graft 10/31/2016   Family history of colon cancer 10/31/2016   History of adenomatous polyp of colon 10/31/2016   Osteoarthritis 10/31/2016   Atrial fibrillation ([place]) 10/31/2016   Syncope 02/11/2016   High risk medication use    Skin lesion 06/04/2015   Lower back pain 05/01/2015   Elevated TSH 12/26/2014   Back pain 01/22/2014   Aortic stenosis 05/16/2013   Bladder neoplasm 11/23/2012   BCC (basal cell carcinoma of skin) 08/29/2012   Vertigo 08/24/2012   GERD (gastroesophageal reflux disease) 05/01/2012   Hyperlipidemia 03/14/2012   TMJ pain dysfunction syndrome 01/20/2012   Long term (current) use of anticoagulants 08/31/2011   RVOT ventricular tachycardia ([place])    Cough 04/29/2009   HEARING LOSS, SUDDEN 12/03/2007   Essential hypertension 02/22/2007   PAF (paroxysmal atrial fibrillation) ([place]) 02/22/2007    Rationale for Evaluation and Treatment Rehabilitation   ONSET DATE: mid- Feb. 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 Amiodarone Rx S/P CABG x 52004 Stroke (HCC)03/10/2020, Diplopia, Occipital CVA Nov. 2021   PAIN:  Are you having pain? No  PRECAUTIONS: Other: dizziness  WEIGHT BEARING RESTRICTIONS No  FALLS: Has patient fallen in last 6 months? No  LIVING ENVIRONMENT: Lives with: lives with their family and lives with their spouse Lives in: House/apartment  PLOF: Independent  PATIENT GOALS reduce the dizziness  OBJECTIVE:      VESTIBULAR TREATMENT:  SVA line 8;  DVA line 6 with approx. 2 letters inaccurate; pt reported  increased dizziness with attempting to reread line with passive head shaking  Standing Balance: Surface: Floor and Pillows Position: Feet Hip Width Apart and feet together Completed with: Eyes Open and Eyes Closed; Head Turns x 5 Reps and Head Nods x 5 Reps   Reviewed HEP; x1 viewing and standing on pillows with feet apart and feet together    PATIENT EDUCATION: Education details: x1 viewing in standing; standing on pillows with EO and EC - feet apart; [employer] added 09-14-21 [id number] Person educated: Patient Education method: Explanation, Demonstration, and Handouts Education comprehension: verbalized understanding and returned demonstration   GOALS: Goals reviewed with patient? Yes   LONG TERM GOALS: Target date: 10-15-21  Independent in HEP for balance and vestibular exercises.  Baseline:  Goal status: Met  2.  Increase FOTO score to >/= 55/100 to demo improvement in dizziness. Baseline: 43/100;  score 45/100 on 10-11-21 Goal status: Not met  3.  Pt will report at least 50% improvement in vertigo, I.e. intensity rating </= 4/10. Baseline: 7/10 Goal status: Not met - pt reports dizziness rating 6-7/10 on 10-11-21   ASSESSMENT:  CLINICAL IMPRESSION: Pt has met LTG #1 only;  LTG's #2 and 3 not met due to pt  reporting no significant change or improvement in the dizziness.  Pt continues to have decreased vestibular input in maintaining balance as evidenced by LOB with standing on pillows with EC with head turns, especially with vertical head turns which is more indicative of central etiology (compared to peripheral vestibular dysfunction etiology).  Pt has improved with gaze stabilization with a 2 line difference with DVA, indicative of improved VOR function.  Pt is discharged due to lack of progress.  Pt has been instructed to continue with HEP to try to increase vestibular input in maintaining balance. Pt agrees with D/C plan at this time.    OBJECTIVE IMPAIRMENTS decreased balance, difficulty walking, and dizziness.   ACTIVITY LIMITATIONS cleaning, community activity, driving, meal prep, laundry, and shopping.   PERSONAL FACTORS Age, Time since onset of injury/illness/exacerbation, and 1-2 comorbidities: h/o occipital CVA in Nov. 2021  are also affecting patient's functional outcome.    REHAB POTENTIAL: Good  CLINICAL DECISION MAKING: Evolving/moderate complexity  EVALUATION COMPLEXITY: Moderate   PLAN: PT FREQUENCY: 1x/week  PT DURATION: 4 weeks  PLANNED INTERVENTIONS: Therapeutic exercises, Therapeutic activity, Neuromuscular re-education, Balance training, Gait training, Patient/Family education, and Vestibular training  PLAN FOR NEXT SESSION: N/A - D/C on 10-11-21   PHYSICAL THERAPY DISCHARGE SUMMARY  Visits from Start of Care: 3  Current functional level related to goals / functional outcomes: See above for progress towards goals   Remaining deficits: Continued c/o dizziness (unknown etiology); pt continues to have nystagmus with Lt gaze Continued decreased vestibular input in maintaining balance   Education / Equipment: Pt has been instructed in HEP for balance/vestibular exercises and gaze stabilization.    Patient agrees to discharge. Patient goals were partially met.  Patient is being discharged due to lack of progress.    Alda Lea, PT 10/11/2021, 5:23 PM

## 2021-10-14 DIAGNOSIS — M1711 Unilateral primary osteoarthritis, right knee: Secondary | ICD-10-CM | POA: Diagnosis not present

## 2021-10-14 DIAGNOSIS — M13861 Other specified arthritis, right knee: Secondary | ICD-10-CM | POA: Diagnosis not present

## 2021-10-19 ENCOUNTER — Telehealth: Payer: Self-pay | Admitting: Family Medicine

## 2021-10-22 ENCOUNTER — Ambulatory Visit (INDEPENDENT_AMBULATORY_CARE_PROVIDER_SITE_OTHER): Payer: Medicare Other

## 2021-10-22 VITALS — Ht 70.0 in | Wt 192.0 lb

## 2021-10-22 DIAGNOSIS — Z0000 Encounter for general adult medical examination without abnormal findings: Secondary | ICD-10-CM | POA: Diagnosis not present

## 2021-10-22 NOTE — Patient Instructions (Signed)
[name] , Thank you for taking time to come for your Medicare Wellness Visit. I appreciate your ongoing commitment to your health goals. Please review the following plan we discussed and let me know if I can assist you in the future.   Screening recommendations/referrals: Colonoscopy: completed 05/29/2018, due 05/30/2023 Recommended yearly ophthalmology/optometry visit for glaucoma screening and checkup Recommended yearly dental visit for hygiene and checkup  Vaccinations: Influenza vaccine: due 11/23/2021 Pneumococcal vaccine: completed 12/25/2014 Tdap vaccine: completed 12/08/2011, due 12/07/2021 Shingles vaccine: completed   Covid-19:  03/24/2020, 09/18/2019, 07/19/2019, 06/28/2019  Advanced directives: Please bring a copy of your POA (Power of Attorney) and/or Living Will to your next appointment.   Conditions/risks identified: none  Next appointment: Follow up in one year for your annual wellness visit.   Preventive Care 23 Years and Older, Male Preventive care refers to lifestyle choices and visits with your health care provider that can promote health and wellness. What does preventive care include? A yearly physical exam. This is also called an annual well check. Dental exams once or twice a year. Routine eye exams. Ask your health care provider how often you should have your eyes checked. Personal lifestyle choices, including: Daily care of your teeth and gums. Regular physical activity. Eating a healthy diet. Avoiding tobacco and drug use. Limiting alcohol use. Practicing safe sex. Taking low doses of aspirin every day. Taking vitamin and mineral supplements as recommended by your health care provider. What happens during an annual well check? The services and screenings done by your health care provider during your annual well check will depend on your age, overall health, lifestyle risk factors, and family history of disease. Counseling  Your health care provider may ask  you questions about your: Alcohol use. Tobacco use. Drug use. Emotional well-being. Home and relationship well-being. Sexual activity. Eating habits. History of falls. Memory and ability to understand (cognition). Work and work Statistician. Screening  You may have the following tests or measurements: Height, weight, and BMI. Blood pressure. Lipid and cholesterol levels. These may be checked every 5 years, or more frequently if you are over 32 years old. Skin check. Lung cancer screening. You may have this screening every year starting at age 32 if you have a 30-pack-year history of smoking and currently smoke or have quit within the past 15 years. Fecal occult blood test (FOBT) of the stool. You may have this test every year starting at age 90. Flexible sigmoidoscopy or colonoscopy. You may have a sigmoidoscopy every 5 years or a colonoscopy every 10 years starting at age 59. Prostate cancer screening. Recommendations will vary depending on your family history and other risks. Hepatitis C blood test. Hepatitis B blood test. Sexually transmitted disease (STD) testing. Diabetes screening. This is done by checking your blood sugar (glucose) after you have not eaten for a while (fasting). You may have this done every 1-3 years. Abdominal aortic aneurysm (AAA) screening. You may need this if you are a current or former smoker. Osteoporosis. You may be screened starting at age 58 if you are at high risk. Talk with your health care provider about your test results, treatment options, and if necessary, the need for more tests. Vaccines  Your health care provider may recommend certain vaccines, such as: Influenza vaccine. This is recommended every year. Tetanus, diphtheria, and acellular pertussis (Tdap, Td) vaccine. You may need a Td booster every 10 years. Zoster vaccine. You may need this after age 11. Pneumococcal 13-valent conjugate (PCV13) vaccine. One  dose is recommended after age  21. Pneumococcal polysaccharide (PPSV23) vaccine. One dose is recommended after age 20. Talk to your health care provider about which screenings and vaccines you need and how often you need them. This information is not intended to replace advice given to you by your health care provider. Make sure you discuss any questions you have with your health care provider. Document Released: 05/08/2015 Document Revised: 12/30/2015 Document Reviewed: 02/10/2015 Elsevier Interactive Patient Education  2017 [place] Prevention in the Home Falls can cause injuries. They can happen to people of all ages. There are many things you can do to make your home safe and to help prevent falls. What can I do on the outside of my home? Regularly fix the edges of walkways and driveways and fix any cracks. Remove anything that might make you trip as you walk through a door, such as a raised step or threshold. Trim any bushes or trees on the path to your home. Use bright outdoor lighting. Clear any walking paths of anything that might make someone trip, such as rocks or tools. Regularly check to see if handrails are loose or broken. Make sure that both sides of any steps have handrails. Any raised decks and porches should have guardrails on the edges. Have any leaves, snow, or ice cleared regularly. Use sand or salt on walking paths during winter. Clean up any spills in your garage right away. This includes oil or grease spills. What can I do in the bathroom? Use night lights. Install grab bars by the toilet and in the tub and shower. Do not use towel bars as grab bars. Use non-skid mats or decals in the tub or shower. If you need to sit down in the shower, use a plastic, non-slip stool. Keep the floor dry. Clean up any water that spills on the floor as soon as it happens. Remove soap buildup in the tub or shower regularly. Attach bath mats securely with double-sided non-slip rug tape. Do not have throw  rugs and other things on the floor that can make you trip. What can I do in the bedroom? Use night lights. Make sure that you have a light by your bed that is easy to reach. Do not use any sheets or blankets that are too big for your bed. They should not hang down onto the floor. Have a firm chair that has side arms. You can use this for support while you get dressed. Do not have throw rugs and other things on the floor that can make you trip. What can I do in the kitchen? Clean up any spills right away. Avoid walking on wet floors. Keep items that you use a lot in easy-to-reach places. If you need to reach something above you, use a strong step stool that has a grab bar. Keep electrical cords out of the way. Do not use floor polish or wax that makes floors slippery. If you must use wax, use non-skid floor wax. Do not have throw rugs and other things on the floor that can make you trip. What can I do with my stairs? Do not leave any items on the stairs. Make sure that there are handrails on both sides of the stairs and use them. Fix handrails that are broken or loose. Make sure that handrails are as long as the stairways. Check any carpeting to make sure that it is firmly attached to the stairs. Fix any carpet that is loose or worn.  Avoid having throw rugs at the top or bottom of the stairs. If you do have throw rugs, attach them to the floor with carpet tape. Make sure that you have a light switch at the top of the stairs and the bottom of the stairs. If you do not have them, ask someone to add them for you. What else can I do to help prevent falls? Wear shoes that: Do not have high heels. Have rubber bottoms. Are comfortable and fit you well. Are closed at the toe. Do not wear sandals. If you use a stepladder: Make sure that it is fully opened. Do not climb a closed stepladder. Make sure that both sides of the stepladder are locked into place. Ask someone to hold it for you, if  possible. Clearly mark and make sure that you can see: Any grab bars or handrails. First and last steps. Where the edge of each step is. Use tools that help you move around (mobility aids) if they are needed. These include: Canes. Walkers. Scooters. Crutches. Turn on the lights when you go into a dark area. Replace any light bulbs as soon as they burn out. Set up your furniture so you have a clear path. Avoid moving your furniture around. If any of your floors are uneven, fix them. If there are any pets around you, be aware of where they are. Review your medicines with your doctor. Some medicines can make you feel dizzy. This can increase your chance of falling. Ask your doctor what other things that you can do to help prevent falls. This information is not intended to replace advice given to you by your health care provider. Make sure you discuss any questions you have with your health care provider. Document Released: 02/05/2009 Document Revised: 09/17/2015 Document Reviewed: 05/16/2014 Elsevier Interactive Patient Education  2017 [employer].

## 2021-10-22 NOTE — Progress Notes (Signed)
I connected with [name] [name] today by telephone and verified that I am speaking with the correct person using two identifiers. Location patient: home Location provider: work Persons participating in the virtual visit: Loris, Winrow LPN.   I discussed the limitations, risks, security and privacy concerns of performing an evaluation and management service by telephone and the availability of in person appointments. I also discussed with the patient that there may be a patient responsible charge related to this service. The patient expressed understanding and verbally consented to this telephonic visit.    Interactive audio and video telecommunications were attempted between this provider and patient, however failed, due to patient having technical difficulties OR patient did not have access to video capability.  We continued and completed visit with audio only.     Vital signs may be patient reported or missing.  Subjective:   [name] [name] is a 87 y.o. male who presents for Medicare Annual/Subsequent preventive examination.  Review of Systems     Cardiac Risk Factors include: advanced age (>52mn, >>13women);dyslipidemia;hypertension;male gender     Objective:    Today's Vitals   10/22/21 1041 10/22/21 1042  Weight: 192 lb (87.1 kg)   Height: _0  (1.778 m)   PainSc:  5    Body mass index is 27.55 kg/m.     10/22/2021   10:47 AM 08/30/2021    8:46 PM 06/13/2021    2:32 PM 10/19/2020   10:34 AM 07/02/2020   11:32 AM 03/11/2020    1:45 AM 08/22/2019   10:34 AM  Advanced Directives  Does Patient Have a Medical Advance Directive? Yes Yes No Yes Yes No Yes  Type of AParamedicof AQuambaLiving will Living will  [place]annLiving will Living will;Healthcare Power of [place] will  Does patient want to make changes to medical advance directive?  No - Patient declined   No -  Patient declined No - Patient declined   Copy of [place] Chart? No - copy requested   No - copy requested No - copy requested  No - copy requested  Would patient like information on creating a medical advance directive?      No - Patient declined     Current Medications (verified) Outpatient Encounter Medications as of 10/22/2021  Medication Sig   acetaminophen (TYLENOL) 325 MG tablet Take 650 mg by mouth once as needed for moderate pain or headache.   apixaban (ELIQUIS) 5 MG TABS tablet Take 1 tablet (5 mg total) by mouth 2 (two) times daily. Take evening dose starting 07/09/20   brimonidine (ALPHAGAN) 0.2 % ophthalmic solution Place into the left eye 2 (two) times daily.   butalbital-acetaminophen-caffeine (FIORICET, ESGIC) 50-325-40 MG per tablet Take 1 tablet by mouth 3 (three) times daily as needed for migraine.    diltiazem (CARDIZEM CD) 120 MG 24 hr capsule Take 1 capsule (120 mg) by mouth once daily   fluticasone (FLONASE) 50 MCG/ACT nasal spray Place 1 spray into both nostrils daily.   hydrochlorothiazide (HYDRODIURIL) 25 MG tablet TAKE 1 TABLET BY MOUTH DAILY   latanoprost (XALATAN) 0.005 % ophthalmic solution INSTILL 1 DROP IN BOTH EYES AT BEDTIME -  CONTACT PHARMACY WHEN NEW SUPPLY IS NEEDED   loratadine (CLARITIN) 10 MG tablet Take 10 mg by mouth daily.   melatonin 3 MG TABS tablet Take 3 mg by mouth at bedtime.   Multiple Vitamins-Minerals (MULTIVITAMIN WITH MINERALS) tablet  Take 1 tablet by mouth daily.   Polyethyl Glycol-Propyl Glycol (SYSTANE ULTRA OP) Apply 1-2 drops to eye once as needed (dry eyes.).    potassium chloride (K-DUR) 10 MEQ tablet Take 10 mEq by mouth every morning. Take as directed   pravastatin (PRAVACHOL) 80 MG tablet Take 1 tablet (80 mg total) by mouth every evening.   No facility-administered encounter medications on file as of 10/22/2021.    Allergies (verified) Ace inhibitors, Zocor [simvastatin], Nsaids, Prednisone, Requip  [ropinirole], Cephalexin, Chocolate, Doxycycline, Gabapentin, Hycodan [hydrocodone bit-homatrop mbr], and Oxycodone   History: Past Medical History:  Diagnosis Date   Anticoagulated on Coumadin    followed in [facility] at [facility]   Aortic stenosis    a. Echo 2/15: Severe LVH, EF 55-60%, Gr 1 DD, mild to mod AS (mean 20 mmHg, peak 31 mmHg), AVA 1.4-1.5 cm2, mild AI, MAC, trivial MR, LA upper limits of normal, RVSP 22 mmHg, mild RAE;   b. Echo 2/16:  EF 55-60%, no RWMA, mild AS (mean 17 mmHg, peak 27 mmHg), AVA 1.5 cm2, mild AI, MAC, mild LAE   Arthritis    Bladder neoplasm    Chronic cough    NO CARDIAC OR PULMONARY RELATED   Complication of anesthesia    Coronary artery disease CARDIOLOGIST-  DR KLEIN/ ALLRED   a. s/p CABG 2004, b. LHC with patent grafts 07/2005, ejection fraction 50%;  c. 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 Amiodarone Rx   S/P CABG x 5 2004   Stroke [facility]) 03/10/2020   Stroke in the Left eye   Past Surgical History:  Procedure Laterality Date   CARDIAC CATHETERIZATION  07-26-2005  DR GAMBLE   PRESERVED LVF/  EF 50%/ PATENT GRAFTS   CARDIAC CATHETERIZATION  03-04-2003  DR GAMBLE   SEVERE 3 VESSEL DISEASE   CARDIAC CATHETERIZATION  1991  DR [facility]   PAF/ FALSE POSITIVE STRESS TEST   CAROTID ENDARTERECTOMY Left 2022   CATARACT EXTRACTION W/ INTRAOCULAR LENS IMPLANT Right    CORONARY ARTERY BYPASS GRAFT  03-08-2003  DR [id number]   LIMA TO LAD/ SVG TO OM2/  SVG TO DIAGONAL / SVG TO PDA & PLA   CYSTOSCOPY WITH BIOPSY N/A 12/25/2012   Procedure: CYSTOSCOPY WITH BLADDER BIOPSY;   Surgeon: Fredricka Bonine, MD;  Location: [facility];  Service: Urology;  Laterality: N/A;   ELECTROPHYSIOLOGY STUDY  07-27-2005  DR Carleene Overlie TAYLOR   MAPPING --   RESULT NONINDUCIBLE VT OR SVT/  DX RIGHT VENTRICULAR OUTFLOW TRACT VT AND RULES OUT MORE MALIGNANT CAUSES OF VT   ENDARTERECTOMY Left 07/08/2020   Procedure: LEFT CAROTID ENDARTERECTOMY;  Surgeon: Serafina Mitchell, MD;  Location: [facility] OR;  Service: Vascular;  Laterality: Left;   INGUINAL HERNIA REPAIR Bilateral [place] ARTHROSCOPY Right 1994   LUMBAR DISC SURGERY  1999   L4 -- L5   MOHS SURGERY     by Dr. Levada Dy 2019   REMOVAL VOCAL CORD POLYPS  1978   TONSILLECTOMY  AS CHILD   TRANSTHORACIC ECHOCARDIOGRAM  08/08/2011   MILD LVH/  EF 25-49%/  GRADE I DIASTOLIC DYSFUNCTION/ MILD AV STENOSIS   Family History  Problem Relation Age of Onset   Cancer Mother    Stroke Neg Hx    Social History   Socioeconomic History   Marital status: Married    Spouse name: Not on file   Number of children: 3   Years of education: Not on file   Highest education level: Not on file  Occupational History   Occupation: Retired  Tobacco Use   Smoking status: Former    Packs/day: 1.00    Years: 25.00    Total pack years: 25.00    Types: Cigarettes    Quit date: 04/25/1980    Years since quitting: 41.5   Smokeless tobacco: Never  Vaping Use   Vaping Use: Never used  Substance and Sexual Activity   Alcohol use: No    Alcohol/week: 0.0 standard drinks of alcohol   Drug use: No   Sexual activity: Not on file  Other Topics Concern   Not on file  Social History Narrative   [employer] '56-'58, overseas to [place]   Some care through [place]   No service disability   Social Determinants of Health   Financial Resource Strain: Low Risk  (10/22/2021)   Overall Financial Resource Strain (CARDIA)    Difficulty of Paying Living Expenses: Not hard at all  Food Insecurity: No Food Insecurity (10/22/2021)   Hunger Vital Sign     Worried About Running Out of Food in the Last Year: Never true    [place] in the Last Year: Never true  Transportation Needs: No Transportation Needs (10/22/2021)   PRAPARE - Hydrologist (Medical): No    Lack of Transportation (Non-Medical): No  Physical Activity: Inactive (10/22/2021)   Exercise Vital Sign    Days of Exercise per Week: 0 days    Minutes of Exercise per Session: 0 min  Stress: No Stress Concern Present (10/22/2021)   [place]    Feeling of Stress : Not at all  Social Connections: Not on file    Tobacco Counseling Counseling given: Not Answered   Clinical Intake:  Pre-visit preparation completed: Yes  Pain : 0-10 Pain Score: 5  Pain Type: Chronic pain Pain Orientation: Left, Right Pain Descriptors / Indicators: Aching, Constant Pain Onset: More than a month ago Pain Frequency: Constant     Nutritional Status: BMI 25 -29 Overweight Nutritional Risks: None Diabetes: No  How often do you need to have someone help you when you read instructions, pamphlets, or other written materials from your doctor or pharmacy?: 1 - Never  Diabetic? no  Interpreter Needed?: No  Information entered by :: NAllen LPN   Activities of Daily Living    10/22/2021   10:48 AM  In your present state of health, do you have any difficulty performing the following activities:  Hearing? 0  Comment has hearing aids  Vision? 0  Difficulty concentrating or making decisions? 0  Walking or climbing stairs? 0  Dressing or bathing? 0  Doing errands, shopping? 0  Preparing Food and eating ? N  Using the Toilet? N  In the past six months, have you accidently leaked urine? N  Do you have problems with loss of bowel control? N  Managing your Medications? N  Managing your Finances? N  Housekeeping or managing  your Housekeeping? N    Patient Care Team: Tonia Ghent, MD as  PCP - General (Family Medicine) Deboraha Sprang, MD as PCP - Cardiology (Cardiology) [name], [facility] as Pharmacist (Pharmacist)  Indicate any recent Medical Services you may have received from other than [facility] providers in the past year (date may be approximate).     Assessment:   This is a routine wellness examination for Rondall.  Hearing/Vision screen Vision Screening - Comments:: Regular eye exams, [facility]  Dietary issues and exercise activities discussed: Current Exercise Habits: The patient does not participate in regular exercise at present   Goals Addressed             This Visit's Progress    Patient Stated       10/22/2021, no goals       Depression Screen    10/22/2021   10:48 AM 10/19/2020   10:38 AM 03/17/2020   10:46 AM 08/22/2019   10:35 AM  PHQ 2/9 Scores  PHQ - 2 Score 0 0 0 0  PHQ- 9 Score  0  0    Fall Risk    10/22/2021   10:48 AM 10/19/2020   10:38 AM 03/17/2020   10:46 AM 08/22/2019   10:34 AM 03/19/2019    3:42 PM  Fall Risk   Falls in the past year? 0 0 0 0 0  Comment     Emmi Telephone Survey: data to providers prior to load  Number falls in past yr: 0 0 0 0   Injury with Fall? 0 0 0 0   Risk for fall due to : Medication side effect Medication side effect  Medication side effect   Follow up Falls evaluation completed;Education provided;Falls prevention discussed Falls evaluation completed;Falls prevention discussed Falls evaluation completed Falls evaluation completed;Falls prevention discussed     FALL RISK PREVENTION PERTAINING TO THE HOME:  Any stairs in or around the home? Yes  If so, are there any without handrails? No  Home free of loose throw rugs in walkways, pet beds, electrical cords, etc? Yes  Adequate lighting in your home to reduce risk of falls? Yes   ASSISTIVE DEVICES UTILIZED TO PREVENT FALLS:  Life alert? No  Use of a cane, walker or w/c? No  Grab bars in the bathroom? No  Shower chair or  bench in shower? Yes  Elevated toilet seat or a handicapped toilet? Yes   TIMED UP AND GO:  Was the test performed? No .      Cognitive Function:    10/19/2020   10:44 AM 08/22/2019   10:37 AM  MMSE - Mini Mental State Exam  Orientation to time 5 5  Orientation to Place 5 5  Registration 3 3  Attention/ Calculation 5 5  Recall 3 3  Language- repeat 1 1        10/22/2021   10:50 AM  6CIT Screen  What Year? 0 points  What month? 0 points  What time? 0 points  Count back from 20 0 points  Months in reverse 0 points  Repeat phrase 0 points  Total Score 0 points    Immunizations Immunization History  Administered Date(s) Administered   Fluad Quad(high Dose 65+) 12/28/2018, 02/17/2020   Influenza Split 03/15/2011, 01/19/2012   Influenza Whole 03/11/2002, 02/24/2009, 01/14/2010   Influenza, High Dose Seasonal PF 12/25/2015, 02/19/2017, 01/28/2021   Influenza,inj,Quad PF,6+ Mos 01/16/2013, 01/22/2014, 12/25/2014, 01/19/2016, 01/23/2017, 01/04/2018   Influenza-Unspecified 03/11/2002, 02/19/2003, 01/24/2004, 02/23/2005,  01/23/2006, 04/03/2007, 02/21/2008, 01/23/2009, 02/23/2009, 12/24/2009, 02/12/2020   PFIZER(Purple Top)SARS-COV-2 Vaccination 06/28/2019, 07/19/2019, 09/18/2019, 03/24/2020   Pneumococcal Conjugate-13 11/27/2013, 12/25/2014   Pneumococcal Polysaccharide-23 04/17/2002   Pneumococcal-Unspecified 03/11/2002   Td 12/07/2001, 04/26/2005, 05/11/2005   Tdap 12/08/2011   Tetanus 11/24/2011   Zoster Recombinat (Shingrix) 08/20/2020, 01/01/2021   Zoster, Live 05/17/2007    TDAP status: Up to date  Flu Vaccine status: Up to date  Pneumococcal vaccine status: Up to date  Covid-19 vaccine status: Completed vaccines  Qualifies for Shingles Vaccine? Yes   Zostavax completed Yes   Shingrix Completed?: Yes  Screening Tests Health Maintenance  Topic Date Due   COVID-19 Vaccine (5 - Booster for Pfizer series) 05/19/2020   INFLUENZA VACCINE  11/23/2021    TETANUS/TDAP  12/07/2021   COLONOSCOPY (Pts 45-68yr Insurance coverage will need to be confirmed)  05/30/2023   Pneumonia Vaccine 85 Years old  Completed   Zoster Vaccines- Shingrix  Completed   HPV VACCINES  Aged Out    Health Maintenance  Health Maintenance Due  Topic Date Due   COVID-19 Vaccine (5 - Booster for [place]series) 05/19/2020    Colorectal cancer screening: Type of screening: Colonoscopy. Completed 05/29/2018. Repeat every 5 years  Lung Cancer Screening: (Low Dose CT Chest recommended if Age 85-80years, 30 pack-year currently smoking OR have quit w/in 15years.) does not qualify.   Lung Cancer Screening Referral: no  Additional Screening:  Hepatitis C Screening: does not qualify;   Vision Screening: Recommended annual ophthalmology exams for early detection of glaucoma and other disorders of the eye. Is the patient up to date with their annual eye exam?  Yes  Who is the provider or what is the name of the office in which the patient attends annual eye exams? [facility] If pt is not established with a provider, would they like to be referred to a provider to establish care? No .   Dental Screening: Recommended annual dental exams for proper oral hygiene  Community Resource Referral / Chronic Care Management: CRR required this visit?  No   CCM required this visit?  No      Plan:     I have personally reviewed and noted the following in the patient's chart:   Medical and social history Use of alcohol, tobacco or illicit drugs  Current medications and supplements including opioid prescriptions. Patient is not currently taking opioid prescriptions. Functional ability and status Nutritional status Physical activity Advanced directives List of other physicians Hospitalizations, surgeries, and ER visits in previous 12 months Vitals Screenings to include cognitive, depression, and falls Referrals and appointments  In addition, I have reviewed and  discussed with patient certain preventive protocols, quality metrics, and best practice recommendations. A written personalized care plan for preventive services as well as general preventive health recommendations were provided to patient.     NKellie Simmering LPN   [id number]  Nurse Notes: none  Due to this being a virtual visit, the after visit summary with patients personalized plan was offered to patient via mail or my-chart. Patient would like to access on my-chart

## 2021-10-25 LAB — LIPID PANEL
Cholesterol: 170 (ref 0–200)
HDL: 70 (ref 35–70)
LDL Cholesterol: 85
Triglycerides: 74 (ref 40–160)

## 2021-10-25 LAB — MICROALBUMIN, URINE: Microalb, Ur: 0.517

## 2021-10-25 LAB — CBC AND DIFFERENTIAL
Hemoglobin: 14.2 (ref 13.5–17.5)
Platelets: 191 10*3/uL (ref 150–400)
WBC: 6

## 2021-10-25 LAB — BASIC METABOLIC PANEL
Creatinine: 0.9 (ref <=1.3)
Glucose: 95

## 2021-10-25 LAB — HEPATIC FUNCTION PANEL
ALT: 16 U/L (ref 10–40)
AST: 21 (ref 14–40)
Alkaline Phosphatase: 80 (ref 25–125)

## 2021-10-28 ENCOUNTER — Encounter (INDEPENDENT_AMBULATORY_CARE_PROVIDER_SITE_OTHER): Payer: Medicare Other | Admitting: Ophthalmology

## 2021-10-28 DIAGNOSIS — H43813 Vitreous degeneration, bilateral: Secondary | ICD-10-CM

## 2021-10-28 DIAGNOSIS — I10 Essential (primary) hypertension: Secondary | ICD-10-CM | POA: Diagnosis not present

## 2021-10-28 DIAGNOSIS — H34832 Tributary (branch) retinal vein occlusion, left eye, with macular edema: Secondary | ICD-10-CM

## 2021-10-28 DIAGNOSIS — H34232 Retinal artery branch occlusion, left eye: Secondary | ICD-10-CM | POA: Diagnosis not present

## 2021-10-28 DIAGNOSIS — H35033 Hypertensive retinopathy, bilateral: Secondary | ICD-10-CM

## 2021-10-29 ENCOUNTER — Encounter: Payer: Self-pay | Admitting: Family Medicine

## 2021-10-29 ENCOUNTER — Ambulatory Visit (INDEPENDENT_AMBULATORY_CARE_PROVIDER_SITE_OTHER): Payer: Medicare Other | Admitting: Family Medicine

## 2021-10-29 DIAGNOSIS — R42 Dizziness and giddiness: Secondary | ICD-10-CM | POA: Diagnosis not present

## 2021-10-29 DIAGNOSIS — E785 Hyperlipidemia, unspecified: Secondary | ICD-10-CM | POA: Diagnosis not present

## 2021-10-29 DIAGNOSIS — I350 Nonrheumatic aortic (valve) stenosis: Secondary | ICD-10-CM | POA: Diagnosis not present

## 2021-10-29 DIAGNOSIS — I639 Cerebral infarction, unspecified: Secondary | ICD-10-CM | POA: Diagnosis not present

## 2021-10-29 MED ORDER — CETIRIZINE HCL 10 MG PO TABS: 10.0000 mg | ORAL_TABLET | Freq: Every day | ORAL | Status: AC

## 2021-10-29 NOTE — Patient Instructions (Addendum)
Please send me a copy of your [facility] labs when possible.  Take care.  Glad to see you.  Let me check with neurology, cardiology, and vascular in the meantime.  I would try taking cetrizine at night.     If needed, stop pravastatin for 1 week and see if the leg symptoms get better.

## 2021-10-29 NOTE — Progress Notes (Unsigned)
He had eval at [facility] and bruit noted on B carotids per patient report.  If that from AS? Check with vascular about repeat carotid.    When to repeat echo?  Check with cards.    He had tetanus shot 10/25/21.   He had retinal eval yesterday, repeat injection done.     He had neurology f/u but hadn't had f/u imaging done yet.  Check with neurology about that.  Vertigo is slowly getting better.  It isn't constant.  He got new lenses with prism and that helped.  He is still doing rehab exercises at home.   Still having leg aches at night, not every night.  Noted that walking is better than prior.  Runny nose noted in the AM, sneezing.  Already on flonase.  Is going to change from claritin to zyrtec.    Meds, vitals, and allergies reviewed.   ROS: Per HPI unless specifically indicated in ROS section    R knee in knee brace.

## 2021-10-31 ENCOUNTER — Telehealth: Payer: Self-pay | Admitting: Family Medicine

## 2021-10-31 NOTE — Telephone Encounter (Signed)
[name] Beam, MD  You; Gna-Pod 4 Calls 1 minute ago (9:24 PM)    Hi Dr. Damita Dunnings, Now would be a good time. I'll have my team call him and make sure he is ok with it and we should reorder his MRI brain w/wo contrast thanks!   Pod4: would you call and remind patient it's time for a repeat if he is ok with that? Thanks

## 2021-10-31 NOTE — Telephone Encounter (Signed)
Saw patient recently.  He still has his murmur and I suspect that is the source of his carotid findings on exam.  He wanted to make sure we were on the same page.  When should we repeat his carotid study and when should he have a repeat echo?  I appreciate your help.

## 2021-10-31 NOTE — Telephone Encounter (Signed)
Saw patient recently and his vertigo is slowly getting better.  He is having less symptoms overall but it has not completely resolved.  When do you want to do his follow-up imaging?  I told him I would check with you.  I appreciate your help.

## 2021-10-31 NOTE — Assessment & Plan Note (Signed)
I think this is the issue causing the referred noise in his carotids but I am going to check with vascular and cardiology to make sure we are on the same page.  Discussed.

## 2021-10-31 NOTE — Assessment & Plan Note (Signed)
I will check with neurology about his follow-up imaging.

## 2021-10-31 NOTE — Assessment & Plan Note (Signed)
He can stop pravastatin for 1 week and see if his leg symptoms clearly get better.

## 2021-11-01 ENCOUNTER — Other Ambulatory Visit: Payer: Self-pay

## 2021-11-01 DIAGNOSIS — I4821 Permanent atrial fibrillation: Secondary | ICD-10-CM

## 2021-11-01 NOTE — Telephone Encounter (Signed)
Patient has been notified

## 2021-11-01 NOTE — Telephone Encounter (Signed)
Pt is scheduled to see Dr. Caryl Comes 02/23/22 and is aware that someone will contact him to schedule an Echo prior to that appt.  Echo order has been placed.

## 2021-11-01 NOTE — Telephone Encounter (Addendum)
I spoke with the patient. He is amenable to repeating the MRI brain and he will await a call to schedule. He wants to use the same location as last time with the open MRI (looks like it was [place]).

## 2021-11-01 NOTE — Telephone Encounter (Signed)
Notify pt.  The echo is getting scheduled and he isn't due for repeat carotid study now.  Thanks.

## 2021-11-04 DIAGNOSIS — L570 Actinic keratosis: Secondary | ICD-10-CM | POA: Diagnosis not present

## 2021-11-04 DIAGNOSIS — C44319 Basal cell carcinoma of skin of other parts of face: Secondary | ICD-10-CM | POA: Diagnosis not present

## 2021-11-04 DIAGNOSIS — C4441 Basal cell carcinoma of skin of scalp and neck: Secondary | ICD-10-CM | POA: Diagnosis not present

## 2021-11-04 DIAGNOSIS — L820 Inflamed seborrheic keratosis: Secondary | ICD-10-CM | POA: Diagnosis not present

## 2021-11-04 DIAGNOSIS — D485 Neoplasm of uncertain behavior of skin: Secondary | ICD-10-CM | POA: Diagnosis not present

## 2021-11-04 DIAGNOSIS — C44212 Basal cell carcinoma of skin of right ear and external auricular canal: Secondary | ICD-10-CM | POA: Diagnosis not present

## 2021-11-04 DIAGNOSIS — Z85828 Personal history of other malignant neoplasm of skin: Secondary | ICD-10-CM | POA: Diagnosis not present

## 2021-11-04 DIAGNOSIS — L821 Other seborrheic keratosis: Secondary | ICD-10-CM | POA: Diagnosis not present

## 2021-11-08 ENCOUNTER — Encounter: Payer: Self-pay | Admitting: Neurology

## 2021-11-09 ENCOUNTER — Telehealth: Payer: Self-pay

## 2021-11-09 NOTE — Chronic Care Management (AMB) (Signed)
Chronic Care Management Pharmacy Assistant   Name: [name] [name]  MRN: [id number] DOB: 1934/10/14   Reason for Encounter: General Adherence     Recent office visits:  10/29/21-[name] Duncan,MD(PCP)- f/u HLD,He can stop pravastatin for 1 week and see if his leg symptoms clearly get better.Runny nose,  try taking cetrizine at night.   10/22/21-[name] Allen,LPN(fam med)-Telemedicine, AWV,discussed screenings, vaccines, exercise, no medication changes. 09/28/21-[name] Cable,NP(fam med)-tick bite,labs,try OTC hydrocortizone cream apply to spot on skin.  08/23/21-[name] Duncan,MD-(PCP)- f/u vertigo,referral for balance therapy,reasonable to try diazepam for vertigo.  Recent consult visits:  10/25/21- [name] Fast, MD([facility])- Labs,A1c 5.8,tetanus, covid booster,flu shot, given , f/u 6 months  10/14/21-[name] Dixon,PA(ortho)-no data found  09/22/21-[name] Ursuy,PA(cardio)-no medication changes, f/u 6 months 08/16/21-[name] Ahern,MD(neuro)-Telemedicine, MRI results- unremarkable, repeart in 3 months-recommend lumbar puncture- no medication changes  06/01/21-[name] [name] (ophth) no data found [facility]/26[facility]21- [facility] for ophthal[facility],. Increase thera tear usage to 3-4x/day to relieve symptoms of dryness.f/u 6 months 05/24/21-[name] Collins,PA(vascular)-carotid stenosis,no changes,f/u 1 year  Hospital visits:  06/13/21-[name] [name] ED- dizziness, vertigo,labs, chest xray, CT-head,cardiac monitor, He was given Valium and meclizine for symptomatic relief.Discharged to the home   Medications: Outpatient Encounter Medications as of 11/09/2021  Medication Sig   acetaminophen (TYLENOL) 325 MG tablet Take 650 mg by mouth once as needed for moderate pain or headache.   apixaban (ELIQUIS) 5 MG TABS tablet Take 1 tablet (5 mg total) by mouth 2 (two) times daily. Take evening dose starting 07/09/20   brimonidine (ALPHAGAN) 0.2 % ophthalmic solution Place into the left eye 2 (two) times daily.    butalbital-acetaminophen-caffeine (FIORICET, ESGIC) 50-325-40 MG per tablet Take 1 tablet by mouth 3 (three) times daily as needed for migraine.    cetirizine (ZYRTEC) 10 MG tablet Take 1 tablet (10 mg total) by mouth daily.   diltiazem (CARDIZEM CD) 120 MG 24 hr capsule Take 1 capsule (120 mg) by mouth once daily   fluticasone (FLONASE) 50 MCG/ACT nasal spray Place 1 spray into both nostrils daily.   hydrochlorothiazide (HYDRODIURIL) 25 MG tablet TAKE 1 TABLET BY MOUTH DAILY   latanoprost (XALATAN) 0.005 % ophthalmic solution INSTILL 1 DROP IN BOTH EYES AT BEDTIME -  CONTACT PHARMACY WHEN NEW SUPPLY IS NEEDED   melatonin 3 MG TABS tablet Take 3 mg by mouth at bedtime.   Multiple Vitamins-Minerals (MULTIVITAMIN WITH MINERALS) tablet Take 1 tablet by mouth daily.   Polyethyl Glycol-Propyl Glycol (SYSTANE ULTRA OP) Apply 1-2 drops to eye once as needed (dry eyes.).    potassium chloride (K-DUR) 10 MEQ tablet Take 10 mEq by mouth every morning. Take as directed   pravastatin (PRAVACHOL) 80 MG tablet Take 1 tablet (80 mg total) by mouth every evening.   No facility-administered encounter medications on file as of 11/09/2021.   Contacted [name] [name] on 11/15/21 for general disease state and medication adherence call.   Patient is not more than 5 days past due for refill on the following medications per chart history:  [facility] Medications: Medication Name/mg Last Fill Days Supply Pravastatin '80mg'$   11/04/21   [facility] for medications- no data     What concerns do you have about your medications? Patient does not have any concerns  The patient denies side effects with their medications.   How often do you forget or accidentally miss a dose? Never  Do you use a pillbox? Yes  1 week at a time  Are you having any problems getting your medications from your pharmacy?  No  Has the cost of your medications been a concern? No   Since last visit with CPP, the following interventions have been made.   Advised to switch antihistamine from loratadine to Zyrtec, Allegra, or Xyzal patient is taking Zyrtec  The patient has had an ED visit since last contact. [name]- dizziness- Valium and meclizine for symptomatic relief- No admission   The patient denies problems with their health.   Patient denies concerns or questions for [name] [name], PharmD at this time.   Counseled patient on:  [place] job taking medications, Importance of taking medication daily without missed doses, Benefits of adherence packaging or a pillbox, and Access to CCM team for any cost, medication or pharmacy concerns.   Care Gaps: Annual wellness visit in last year? Yes Most Recent BP reading:120/70  70-P  10/29/21  If Diabetic: Most recent A1C reading:5.8  10/25/21 Last eye exam / retinopathy screening:UTD  Last diabetic foot exam:overdue  Upcoming appointments: Cardiology appointment on 02/23/22   [name] [name], CPP notified  [name] [name], [place]  [phone]

## 2021-11-15 ENCOUNTER — Encounter: Payer: Self-pay | Admitting: Family Medicine

## 2021-11-15 ENCOUNTER — Other Ambulatory Visit: Payer: Self-pay | Admitting: Neurology

## 2021-11-15 ENCOUNTER — Telehealth: Payer: Self-pay | Admitting: Neurology

## 2021-11-15 DIAGNOSIS — C8589 Other specified types of non-Hodgkin lymphoma, extranodal and solid organ sites: Secondary | ICD-10-CM

## 2021-11-15 NOTE — Telephone Encounter (Signed)
medicare/[employer] NPR sent to GI

## 2021-11-16 NOTE — Telephone Encounter (Signed)
See mychart message.  Please use spouse's chart to set up his wife for an OV with me this fall.  Thanks.

## 2021-11-18 NOTE — Telephone Encounter (Signed)
Patient is scheduled   

## 2021-11-22 ENCOUNTER — Other Ambulatory Visit: Payer: Self-pay

## 2021-11-22 ENCOUNTER — Encounter: Payer: Self-pay | Admitting: Neurology

## 2021-11-22 MED ORDER — HYDROCHLOROTHIAZIDE 25 MG PO TABS: 25.0000 mg | ORAL_TABLET | Freq: Every day | ORAL | 2 refills | Status: DC

## 2021-11-23 NOTE — Telephone Encounter (Signed)
Scheduled 8/8 245pm

## 2021-11-23 NOTE — Telephone Encounter (Signed)
per patient I sent order to [facility]

## 2021-11-30 DIAGNOSIS — R9082 White matter disease, unspecified: Secondary | ICD-10-CM | POA: Diagnosis not present

## 2021-12-02 ENCOUNTER — Encounter (INDEPENDENT_AMBULATORY_CARE_PROVIDER_SITE_OTHER): Payer: Medicare Other | Admitting: Ophthalmology

## 2021-12-02 DIAGNOSIS — H34232 Retinal artery branch occlusion, left eye: Secondary | ICD-10-CM

## 2021-12-02 DIAGNOSIS — I10 Essential (primary) hypertension: Secondary | ICD-10-CM | POA: Diagnosis not present

## 2021-12-02 DIAGNOSIS — H35033 Hypertensive retinopathy, bilateral: Secondary | ICD-10-CM

## 2021-12-02 DIAGNOSIS — H43813 Vitreous degeneration, bilateral: Secondary | ICD-10-CM

## 2021-12-02 DIAGNOSIS — H34832 Tributary (branch) retinal vein occlusion, left eye, with macular edema: Secondary | ICD-10-CM

## 2021-12-09 ENCOUNTER — Ambulatory Visit: Payer: Medicare Other | Admitting: Neurology

## 2021-12-16 ENCOUNTER — Ambulatory Visit: Payer: Medicare Other | Admitting: Neurology

## 2021-12-24 ENCOUNTER — Encounter: Payer: Self-pay | Admitting: Neurology

## 2021-12-24 ENCOUNTER — Ambulatory Visit (INDEPENDENT_AMBULATORY_CARE_PROVIDER_SITE_OTHER): Payer: Medicare Other | Admitting: Neurology

## 2021-12-24 VITALS — BP 148/85 | HR 96 | Ht 70.0 in | Wt 190.8 lb

## 2021-12-24 DIAGNOSIS — M25562 Pain in left knee: Secondary | ICD-10-CM

## 2021-12-24 DIAGNOSIS — M791 Myalgia, unspecified site: Secondary | ICD-10-CM | POA: Diagnosis not present

## 2021-12-24 DIAGNOSIS — G8929 Other chronic pain: Secondary | ICD-10-CM | POA: Diagnosis not present

## 2021-12-24 DIAGNOSIS — M25561 Pain in right knee: Secondary | ICD-10-CM

## 2021-12-24 NOTE — Patient Instructions (Signed)
MRI lumbar spine One lab test May need an emg/ncs to evaluate for muscle disease if the above does not give me answers  Electromyoneurogram Electromyoneurogram is a test to check how well your muscles and nerves are working. This procedure includes the combined use of electromyogram (EMG) and nerve conduction study (NCS). EMG is used to evaluate muscles and the nerves that control those muscles. NCS, which is also called electroneurogram, measures how well your nerves conduct electricity. The procedures should be done together to check if your muscles and nerves are healthy. If the results of the tests are abnormal, this may indicate disease or injury, such as a neuromuscular disease or peripheral nerve damage. Tell a health care provider about: Any allergies you have. All medicines you are taking, including vitamins, herbs, eye drops, creams, and over-the-counter medicines. Any bleeding problems you have. Any surgeries you have had. Any medical conditions you have. What are the risks? Generally, this is a safe procedure. However, problems may occur, including: Bleeding or bruising. Infection where the electrodes were inserted. What happens before the test? Medicines Take all of your usually prescribed medications before this testing is performed. Do not stop your blood thinners unless advised by your prescribing physician. General instructions Your health care provider may ask you to warm the limb that will be checked with warm water, hot pack, or wrapping the limb in a blanket. Do not use lotions or creams on the same day that you will be having the procedure. What happens during the test? For EMG  Your health care provider will ask you to stay in a position so that the muscle being studied can be accessed. You will be sitting or lying down. You may be given a medicine to numb the area (local anesthetic) and the skin will be disinfected. A very thin needle that has an electrode will be  inserted into your muscle, one muscle at a time. Typically, multiple muscles are evaluated during a single study. Another small electrode will be placed on your skin near the muscle. Your health care provider will ask you to continue to remain still. The electrodes will record the electrical activity of your muscles. You may see this on a monitor or hear it in the room. After your muscles have been studied at rest, your health care provider will ask you to contract or flex your muscles. The electrodes will record the electrical activity of your muscles. Your health care provider will remove the electrodes and the electrode needle when the procedure is finished. The procedure may vary among health care providers and hospitals. For NCS  An electrode that records your nerve activity (recording electrode) will be placed on your skin by the muscle that is being studied. An electrode that is used as a reference (reference electrode) will be placed near the recording electrode. A paste or gel will be applied to your skin between the recording electrode and the reference electrode. Your nerve will be stimulated with a mild shock. The speed of the nerves and strength of response is recorded by the electrodes. Your health care provider will remove the electrodes and the gel when the procedure is finished. The procedure may vary among health care providers and hospitals. What can I expect after the test? It is up to you to get your test results. Ask your health care provider, or the department that is doing the test, when your results will be ready. Your health care provider may: Give you medicines for any pain.  Monitor the insertion sites to make sure that bleeding stops. You should be able to drive yourself to and from the test. Discomfort can persist for a few hours after the test, but should be better the next day. Contact a health care provider if: You have swelling, redness, or drainage at any of  the insertion sites. Summary Electromyoneurogram is a test to check how well your muscles and nerves are working. If the results of the tests are abnormal, this may indicate disease or injury. This is a safe procedure. However, problems may occur, such as bleeding and infection. Your health care provider will do two tests to complete this procedure. One checks your muscles (EMG) and another checks your nerves (NCS). It is up to you to get your test results. Ask your health care provider, or the department that is doing the test, when your results will be ready. This information is not intended to replace advice given to you by your health care provider. Make sure you discuss any questions you have with your health care provider. Document Revised: 12/23/2020 Document Reviewed: 11/22/2020 Elsevier Patient Education  [place].

## 2021-12-24 NOTE — Progress Notes (Unsigned)
[facility]    Provider:  Dr Jaynee Eagles Requesting Provider: Tonia Ghent, MD Primary Care Provider:  Tonia Ghent, MD  CC:  ***  HPI:  [name] [name] is a 87 y.o. male here as requested by Tonia Ghent, MD for ***.  ***  A few months ago, he has had aching legs and thighs, not ocnstant but off and on during the night and down into his calfs. He may have some ankle swelling. Ankle pain ongoing for 20+ years, he feels some weakness in the legs and progressive low back pain, has had multiple surgeries with Dr. Ellene Route. If he sits a while and he gets up it is difficult to get up and it hurts in the knees and lower back. He needs knee replacements, symptoms are worse in the right than the left leg. Almost every night they ache, a knee brace helps with the aching and so do compression socks they come above the knees. He has swelling, feels tight like a tourniquet, has had issues for 20+ years, right > left leg discomfort, arms are fine, if he gets on the floor he has to roll over and get up, sometimes hurts to get up(points to the knees and above the knees), more difficult to walk uphill and stairs. Aspirin helps, tylenol does not.   +absent AJs, 1+ patellars  Reviewed notes, labs and imaging from outside physicians, which showed ***  Review of Systems: Patient complains of symptoms per HPI as well as the following symptoms ***. Pertinent negatives and positives per HPI. All others negative.   Social History   Socioeconomic History   Marital status: Married    Spouse name: [name]   Number of children: 3   Years of education: Not on file   Highest education level: Not on file  Occupational History   Occupation: Retired  Tobacco Use   Smoking status: Former    Packs/day: 1.00    Years: 25.00    Total pack years: 25.00    Types: Cigarettes    Quit date: 04/25/1980    Years since quitting: 41.6   Smokeless tobacco: Never  Vaping Use   Vaping Use: Never  used  Substance and Sexual Activity   Alcohol use: No    Alcohol/week: 0.0 standard drinks of alcohol   Drug use: No   Sexual activity: Not on file  Other Topics Concern   Not on file  Social History Narrative   [employer] '[employer]-'58, overseas to [place]   Some care through [place]   No service disability   Social Determinants of Health   Financial Resource Strain: Low Risk  (10/22/2021)   Overall Financial Resource Strain (CARDIA)    Difficulty of Paying Living Expenses: Not hard at all  Food Insecurity: No Food Insecurity (10/22/2021)   Hunger Vital Sign    Worried About Running Out of Food in the Last Year: Never true    [place] in the Last Year: Never true  Transportation Needs: No Transportation Needs (10/22/2021)   PRAPARE - Hydrologist (Medical): No    Lack of Transportation (Non-Medical): No  Physical Activity: Inactive (10/22/2021)   Exercise Vital Sign    Days of Exercise per Week: 0 days    Minutes of Exercise per Session: 0 min  Stress: No Stress Concern Present (10/22/2021)   [place]    Feeling of Stress : Not at all  Social Connections: Not on file  Intimate Partner Violence: Not At Risk (10/19/2020)   Humiliation, Afraid, Rape, and Kick questionnaire    Fear of Current or Ex-Partner: No    Emotionally Abused: No    Physically Abused: No    Sexually Abused: No    Family History  Problem Relation Age of Onset   Cancer Mother    Stroke Neg Hx     Past Medical History:  Diagnosis Date   Anticoagulated on Coumadin    [facility]ed [facility] at [facility]   Aortic stenosis    a. Echo 2/15: Severe LVH, EF 55-60%, Gr 1 DD, mild to mod AS (mean 20 mmHg, peak 31 mmHg), AVA 1.4-1.5 cm2, mild AI, MAC, trivial MR, LA upper limits of normal, RVSP 22 mmHg, mild RAE;   b. Echo 2/16:  EF 55-60%, no RWMA, mild AS (mean 17 mmHg, peak 27 mmHg), AVA 1.5 cm2, mild AI, MAC, mild LAE    Arthritis    Bladder neoplasm    Chronic cough    NO CARDIAC OR PULMONARY RELATED   Complication of anesthesia    Coronary artery disease CARDIOLOGIST-  DR KLEIN/ ALLRED   a. s/p CABG 2004, b. LHC with patent grafts 07/2005, ejection fraction 50%;  c. 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 Amiodarone Rx   S/P CABG x 5 2004   Stroke [facility]) 03/10/2020   Stroke in the Left eye    Patient Active Problem List   Diagnosis Date Noted   Abnormal CBC 09/28/2021   Bilateral leg pain 09/28/2021   Tick bite of abdominal wall 09/28/2021   Abnormal finding on MRI of brain 09/07/2021   Aorta disorder ([place]) 08/25/2021   Advance care planning 10/28/2020   Right knee pain 09/21/2020   Traumatic leg ulcer ([place]) 08/14/2020   Bilateral pseudophakia 07/27/2020   Carpal tunnel syndrome 07/27/2020   Dermatochalasis 07/27/2020   Headache 07/27/2020   Hematuria 07/27/2020   Insomnia 07/27/2020   Postsurgical aortocoronary bypass status 07/27/2020   Pure hypercholesterolemia 07/27/2020   Radiculopathy 07/27/2020   Vitamin D deficiency 07/27/2020   Stenosis of internal carotid artery with cerebral infarction, right ([place]) 07/08/2020   Glaucoma (increased eye pressure) 06/19/2020   Stress reaction 06/19/2020   Imbalance 03/27/2020   Lightheadedness 03/27/2020   Retinal artery occlusion 03/10/2020   Constipation 12/22/2019   Right foot pain 09/11/2019   Leg pain 06/23/2019   Muscle pain 04/04/2019   Laryngopharyngeal reflux (LPR) 05/22/2018   Rhinitis sicca 05/22/2018   Sciatica 12/28/2017   Neuropathy 10/07/2017    Foot swelling 10/07/2017   Shoulder pain 03/29/2017   CAD (coronary artery disease), autologous vein bypass graft 10/31/2016   Family history of colon cancer 10/31/2016   History of adenomatous polyp of colon 10/31/2016   Osteoarthritis 10/31/2016   Atrial fibrillation ([place]) 10/31/2016   Syncope 02/11/2016   High risk medication use    Skin lesion 06/04/2015   Lower back pain 05/01/2015   Elevated TSH 12/26/2014   Back pain 01/22/2014   Aortic  stenosis 05/16/2013   Bladder neoplasm 11/23/2012   BCC (basal cell carcinoma of skin) 08/29/2012   Vertigo 08/24/2012   GERD (gastroesophageal reflux disease) 05/01/2012   Hyperlipidemia 03/14/2012   TMJ pain dysfunction syndrome 01/20/2012   Long term (current) use of anticoagulants 08/31/2011   RVOT ventricular tachycardia (HCC)    Cough 04/29/2009   HEARING LOSS, SUDDEN 12/03/2007   Essential hypertension 02/22/2007   PAF (paroxysmal atrial fibrillation) ([place]) 02/22/2007    Past Surgical History:  Procedure Laterality Date   CARDIAC CATHETERIZATION  07-26-2005  DR GAMBLE   PRESERVED LVF/  EF 50%/ PATENT GRAFTS   CARDIAC CATHETERIZATION  03-04-2003  DR GAMBLE   SEVERE 3 VESSEL DISEASE   CARDIAC CATHETERIZATION  1991  DR [facility]   PAF/ FALSE POSITIVE STRESS TEST   CAROTID ENDARTERECTOMY Left 2022   CATARACT EXTRACTION W/ INTRAOCULAR LENS IMPLANT Right    CORONARY ARTERY BYPASS GRAFT  03-08-2003  DR OVFIEPPI   LIMA TO LAD/ SVG TO OM2/  SVG TO DIAGONAL / SVG TO PDA & PLA   CYSTOSCOPY WITH BIOPSY N/A 12/25/2012   Procedure: CYSTOSCOPY WITH BLADDER BIOPSY;  Surgeon: Fredricka Bonine, MD;  Location: [facility];  Service: Urology;  Laterality: N/A;   ELECTROPHYSIOLOGY STUDY  07-27-2005  DR Carleene Overlie TAYLOR   MAPPING --   RESULT NONINDUCIBLE VT OR SVT/  DX RIGHT VENTRICULAR OUTFLOW TRACT VT AND RULES OUT MORE MALIGNANT CAUSES OF VT   ENDARTERECTOMY Left 07/08/2020   Procedure: LEFT CAROTID ENDARTERECTOMY;  Surgeon:  Serafina Mitchell, MD;  Location: [facility] OR;  Service: Vascular;  Laterality: Left;   INGUINAL HERNIA REPAIR Bilateral [place] ARTHROSCOPY Right 1994   LUMBAR DISC SURGERY  1999   L4 -- L5   MOHS SURGERY     by Dr. Levada Dy 2019   REMOVAL VOCAL CORD POLYPS  1978   TONSILLECTOMY  AS CHILD   TRANSTHORACIC ECHOCARDIOGRAM  08/08/2011   MILD LVH/  EF 95-18%/  GRADE I DIASTOLIC DYSFUNCTION/ MILD AV STENOSIS    Current Outpatient Medications  Medication Sig Dispense Refill   acetaminophen (TYLENOL) 325 MG tablet Take 650 mg by mouth once as needed for moderate pain or headache.     apixaban (ELIQUIS) 5 MG TABS tablet Take 1 tablet (5 mg total) by mouth 2 (two) times daily. Take evening dose starting 07/09/20 180 tablet 3   brimonidine (ALPHAGAN) 0.2 % ophthalmic solution Place into the left eye 2 (two) times daily.     butalbital-acetaminophen-caffeine (FIORICET, ESGIC) 50-325-40 MG per tablet Take 1 tablet by mouth 3 (three) times daily as needed for migraine.      cetirizine (ZYRTEC) 10 MG tablet Take 1 tablet (10 mg total) by mouth daily.     diltiazem (CARDIZEM CD) 120 MG 24 hr capsule Take 1 capsule (120 mg) by mouth once daily     fluticasone (FLONASE) 50 MCG/ACT nasal spray Place 1 spray into both nostrils daily. 16 g 1   hydrochlorothiazide (HYDRODIURIL) 25 MG tablet Take 1 tablet (25 mg total) by mouth daily. 90 tablet 2   latanoprost (XALATAN) 0.005 % ophthalmic solution INSTILL 1 DROP IN BOTH EYES AT BEDTIME -  CONTACT PHARMACY WHEN NEW SUPPLY IS NEEDED     melatonin 3 MG TABS tablet Take 3 mg by mouth at bedtime.     Multiple Vitamins-Minerals (MULTIVITAMIN WITH MINERALS) tablet Take 1 tablet by mouth daily.     Polyethyl Glycol-Propyl Glycol (SYSTANE ULTRA  OP) Apply 1-2 drops to eye once as needed (dry eyes.).      potassium chloride (K-DUR) 10 MEQ tablet Take 10 mEq by mouth every morning. Take as directed     pravastatin (PRAVACHOL) 80 MG tablet Take 1 tablet (80 mg total) by  mouth every evening. 90 tablet 0   No current facility-administered medications for this visit.    Allergies as of 12/24/2021 - Review Complete 12/24/2021  Allergen Reaction Noted   Ace inhibitors Other (See Comments) 12/17/2012   Zocor [simvastatin] Other (See Comments) 12/17/2012   Nsaids  09/17/2020   Prednisone Other (See Comments) 10/29/2020   Requip [ropinirole]  08/23/2021   Cephalexin Rash    Chocolate  03/10/2020   Doxycycline Other (See Comments) 04/30/2018   Gabapentin Other (See Comments) 12/17/2019   Hycodan [hydrocodone bit-homatrop mbr] Nausea And Vomiting 08/18/2011   Oxycodone Nausea Only 09/01/2015    Vitals: BP (!) 148/85   Pulse 96   Ht _0  (1.778 m)   Wt 190 lb 12.8 oz (86.5 kg)   BMI 27.38 kg/m  Last Weight:  Wt Readings from Last 1 Encounters:  12/24/21 190 lb 12.8 oz (86.5 kg)   Last Height:   Ht Readings from Last 1 Encounters:  12/24/21 _1  (1.778 m)     Physical exam: Exam: Gen: NAD, conversant, well nourised, obese, well groomed                     CV: RRR, no MRG. No Carotid Bruits. No peripheral edema, warm, nontender Eyes: Conjunctivae clear without exudates or hemorrhage  Neuro: Detailed Neurologic Exam  Speech:    Speech is normal; fluent and spontaneous with normal comprehension.  Cognition:    The patient is oriented to person, place, and time;     recent and remote memory intact;     language fluent;     normal attention, concentration,     fund of knowledge Cranial Nerves:    The pupils are equal, round, and reactive to light. The fundi are normal and spontaneous venous pulsations are present. Visual fields are full to finger confrontation. Extraocular movements are intact. Trigeminal sensation is intact and the muscles of mastication are normal. The face is symmetric. The palate elevates in the midline. Hearing intact. Voice is normal. Shoulder shrug is normal. The tongue has normal motion without fasciculations.    Coordination:    Normal finger to nose and heel to shin. Normal rapid alternating movements.   Gait:    Heel-toe and tandem gait are normal.   Motor Observation:    No asymmetry, no atrophy, and no involuntary movements noted. Tone:    Normal muscle tone.    Posture:    Posture is normal. normal erect    Strength:    Strength is V/V in the upper and lower limbs.      Sensation: intact to LT     Reflex Exam:  DTR's:    Deep tendon reflexes in the upper and lower extremities are normal bilaterally.   Toes:    The toes are downgoing bilaterally.   Clonus:    Clonus is absent.    Assessment/Plan:    No orders of the defined types were placed in this encounter.  No orders of the defined types were placed in this encounter.   Cc: Tonia Ghent, MD,  Tonia Ghent, MD  Sarina Ill, MD  [facility] [address] [place], [place]  [address]  Phone [phone] Fax [phone]

## 2021-12-25 DIAGNOSIS — G8929 Other chronic pain: Secondary | ICD-10-CM | POA: Insufficient documentation

## 2021-12-25 LAB — CK: Total CK: 65 U/L (ref 30–208)

## 2021-12-30 ENCOUNTER — Telehealth: Payer: Self-pay | Admitting: Neurology

## 2021-12-30 ENCOUNTER — Encounter (INDEPENDENT_AMBULATORY_CARE_PROVIDER_SITE_OTHER): Payer: Medicare Other | Admitting: Ophthalmology

## 2021-12-31 ENCOUNTER — Encounter (INDEPENDENT_AMBULATORY_CARE_PROVIDER_SITE_OTHER): Payer: Medicare Other | Admitting: Ophthalmology

## 2021-12-31 DIAGNOSIS — H348322 Tributary (branch) retinal vein occlusion, left eye, stable: Secondary | ICD-10-CM

## 2021-12-31 DIAGNOSIS — H43813 Vitreous degeneration, bilateral: Secondary | ICD-10-CM

## 2021-12-31 DIAGNOSIS — I10 Essential (primary) hypertension: Secondary | ICD-10-CM

## 2021-12-31 DIAGNOSIS — H35033 Hypertensive retinopathy, bilateral: Secondary | ICD-10-CM | POA: Diagnosis not present

## 2022-01-07 DIAGNOSIS — M25562 Pain in left knee: Secondary | ICD-10-CM | POA: Diagnosis not present

## 2022-01-19 ENCOUNTER — Encounter: Payer: Self-pay | Admitting: Family Medicine

## 2022-01-19 ENCOUNTER — Ambulatory Visit (INDEPENDENT_AMBULATORY_CARE_PROVIDER_SITE_OTHER): Payer: Medicare Other | Admitting: Family Medicine

## 2022-01-19 VITALS — BP 110/82 | HR 92 | Temp 97.9°F | Ht 70.0 in | Wt 186.5 lb

## 2022-01-19 DIAGNOSIS — M545 Low back pain, unspecified: Secondary | ICD-10-CM

## 2022-01-19 DIAGNOSIS — I639 Cerebral infarction, unspecified: Secondary | ICD-10-CM

## 2022-01-19 DIAGNOSIS — M79604 Pain in right leg: Secondary | ICD-10-CM

## 2022-01-19 DIAGNOSIS — M5137 Other intervertebral disc degeneration, lumbosacral region: Secondary | ICD-10-CM

## 2022-01-19 DIAGNOSIS — M79605 Pain in left leg: Secondary | ICD-10-CM

## 2022-01-19 MED ORDER — GABAPENTIN 300 MG PO CAPS: 300.0000 mg | ORAL_CAPSULE | Freq: Every day | ORAL | 2 refills | Status: DC

## 2022-01-19 MED ORDER — METHYLPREDNISOLONE 4 MG PO TBPK: ORAL_TABLET | ORAL | 0 refills | Status: DC

## 2022-01-19 NOTE — Progress Notes (Unsigned)
    Garnell Begeman T. Litzi Binning, MD, [place] at [facility] [place], [address]  Phone: [phone]  FAX: [phone]  MARCQUIS RIDLON - 87 y.o. male  MRN [id number]  Date of Birth: [birth year]  Date: 01/19/2022  PCP: Tonia Ghent, MD  Referral: Tonia Ghent, MD  Chief Complaint  Patient presents with   Leg Pain    Bilateral Thighs (ache like a toothache) Right is worse and they hurt worse at night   Back Pain    Low Back   Subjective:   THI SISEMORE is a 87 y.o. very pleasant male patient with Body mass index is 26.76 kg/m. who presents with the following:  B low back pain and radicular symptoms.   Groin pain Thigh pain Back pain  Will ache during the day, will be on a raised position in the recliner.  Has been ongoing 3-4 months of this year.   Vibrator will help.   Went to [facility] a couple o fweeks ago, and had a Baker's cyst.  Had a steroid injection in the knees.   Also recently saw Neurology earlier in the year, one month ago.   Also gets some pain in the back of his legs.   Dr. Ellene Route operated on back in 1999 and about 7-8 years.   Only sleeping 3-4 hours a night.   Review of Systems is noted in the HPI, as appropriate  Objective:   BP 110/82   Pulse 92   Temp 97.9 F (36.6 C) (Oral)   Ht '5\' 10"'$  (1.778 m)   Wt 186 lb 8 oz (84.6 kg)   SpO2 96%   BMI 26.76 kg/m   GEN: No acute distress; alert,appropriate. PULM: Breathing comfortably in no respiratory distress PSYCH: Normally interactive.   Laboratory and Imaging Data:  Assessment and Plan:   ***

## 2022-01-20 ENCOUNTER — Telehealth: Payer: Self-pay | Admitting: Family Medicine

## 2022-01-20 NOTE — Telephone Encounter (Signed)
Very reasonable. 

## 2022-01-20 NOTE — Telephone Encounter (Signed)
Patient called and stated that he was prescribe Gabapentin and he tool one pill and had some questions about the medication. Call back number [phone].

## 2022-01-20 NOTE — Telephone Encounter (Signed)
Spoke with [name] [name].  He states he took Gabapentin last night and he is not able to tolerate it.  He states it made him off balance and he had blurred vision so he is not going to continue taking it.   He just wanted to make Dr. Lorelei Pont aware of this.   He will start the Medrol Dosepak tomorrow.  FYI to Dr. Lorelei Pont.  Medication list updated.

## 2022-01-31 ENCOUNTER — Ambulatory Visit (HOSPITAL_COMMUNITY): Payer: Medicare Other | Attending: Physician Assistant

## 2022-01-31 DIAGNOSIS — I4821 Permanent atrial fibrillation: Secondary | ICD-10-CM | POA: Insufficient documentation

## 2022-01-31 LAB — ECHOCARDIOGRAM COMPLETE
AR max vel: 0.97 cm2
AV Area VTI: 0.95 cm2
AV Area mean vel: 0.95 cm2
AV Mean grad: 35.8 mmHg
AV Peak grad: 63 mmHg
Ao pk vel: 3.97 m/s
P 1/2 time: 612 msec
S' Lateral: 3.7 cm

## 2022-02-03 ENCOUNTER — Encounter (INDEPENDENT_AMBULATORY_CARE_PROVIDER_SITE_OTHER): Payer: Medicare Other | Admitting: Ophthalmology

## 2022-02-03 DIAGNOSIS — H348322 Tributary (branch) retinal vein occlusion, left eye, stable: Secondary | ICD-10-CM

## 2022-02-03 DIAGNOSIS — H43813 Vitreous degeneration, bilateral: Secondary | ICD-10-CM | POA: Diagnosis not present

## 2022-02-03 DIAGNOSIS — I10 Essential (primary) hypertension: Secondary | ICD-10-CM | POA: Diagnosis not present

## 2022-02-03 DIAGNOSIS — H35033 Hypertensive retinopathy, bilateral: Secondary | ICD-10-CM

## 2022-02-03 DIAGNOSIS — H34231 Retinal artery branch occlusion, right eye: Secondary | ICD-10-CM

## 2022-02-07 ENCOUNTER — Ambulatory Visit: Payer: Medicare Other | Attending: Cardiovascular Disease | Admitting: Cardiovascular Disease

## 2022-02-07 ENCOUNTER — Encounter: Payer: Self-pay | Admitting: Cardiovascular Disease

## 2022-02-07 VITALS — BP 126/80 | HR 82 | Ht 70.0 in | Wt 188.6 lb

## 2022-02-07 DIAGNOSIS — I350 Nonrheumatic aortic (valve) stenosis: Secondary | ICD-10-CM | POA: Insufficient documentation

## 2022-02-07 DIAGNOSIS — I639 Cerebral infarction, unspecified: Secondary | ICD-10-CM

## 2022-02-07 DIAGNOSIS — I5032 Chronic diastolic (congestive) heart failure: Secondary | ICD-10-CM | POA: Diagnosis not present

## 2022-02-07 NOTE — Progress Notes (Signed)
Pre Surgical Assessment: 5 M Walk Test  65M=16.3f  5 Meter Walk Test- trial 1: 4.84 seconds 5 Meter Walk Test- trial 2: 4.45 seconds 5 Meter Walk Test- trial 3: 4.37 seconds 5 Meter Walk Test Average: 4.55 seconds Per patient stated "felt dizzy with walking"

## 2022-02-07 NOTE — Patient Instructions (Signed)
Medication Instructions:  Your physician recommends that you continue on your current medications as directed. Please refer to the Current Medication list given to you today.  *If you need a refill on your cardiac medications before your next appointment, please call your pharmacy*   Lab Work: BNP Today If you have labs (blood work) drawn today and your tests are completely normal, you will receive your results only by: [place] (if you have MyChart) OR A paper copy in the mail If you have any lab test that is abnormal or we need to change your treatment, we will call you to review the results.   Testing/Procedures: ECHO (prior to next visit in 6 months) Your physician has requested that you have an echocardiogram. Echocardiography is a painless test that uses sound waves to create images of your heart. It provides your doctor with information about the size and shape of your heart and how well your heart's chambers and valves are working. This procedure takes approximately one hour. There are no restrictions for this procedure. Please do NOT wear cologne, perfume, aftershave, or lotions (deodorant is allowed). Please arrive 15 minutes prior to your appointment time.  Follow-Up: At [facility], you and your health needs are our priority.  As part of our continuing mission to provide you with exceptional heart care, we have created designated Provider Care Teams.  These Care Teams include your primary Cardiologist (physician) and Advanced Practice Providers (APPs -  Physician Assistants and Nurse Practitioners) who all work together to provide you with the care you need, when you need it.  Your next appointment:   6 month(s)  The format for your next appointment:   In Person  Provider:   Sherren Mocha, MD     Important Information About Sugar

## 2022-02-07 NOTE — Progress Notes (Signed)
Cardiology Office Note:    Date:  02/16/2022   ID:  Epimenio, Schetter 1935-02-01, MRN [id number]  PCP:  Tonia Ghent, MD   [place] Providers Cardiologist:  Virl Axe, MD     Referring MD: Tonia Ghent, MD   Chief Complaint  Patient presents with   Aortic Stenosis    History of Present Illness:    [name] [name] is a 87 y.o. male referred for evaluation of aortic stenosis.  The patient has been followed by our EP team (Dr. Caryl Comes and Tommye Standard, PA-C).  He has a history of coronary artery disease with remote CABG, ventricular tachycardia, permanent atrial fibrillation, and stroke.  A recent echocardiogram demonstrated progressive aortic stenosis, now in the moderate/severe range and he is referred for further evaluation.  The patient is here alone today.  His primary complaint is dizziness.  He has experienced this ever since having an occipital stroke several months ago.  He does not have shortness of breath, chest pain, edema, orthopnea, or PND.  He has been followed closely by neurology and they have done extensive testing.  I have reviewed their notes in detail today.  There was some concern based on his imaging results that he might have CNS lymphoma amongst a variety of other potential problems.  His work-up was negative and a repeat MRI of the brain showed resolution of the findings and initially raised concern.  The patient's dizziness occurs with position changes.  He has not had frank syncope.  His dizziness is not limited to physical exertion.  The patient's recent echocardiogram showed an LVEF of 65%, normal RV function, normal mitral valve function, with a thickened and calcified aortic valve, moderate aortic insufficiency, and Doppler data consistent with moderate to severe aortic stenosis.  The patient's mean gradient is 36 mmHg, peak velocity 3.97 m/s, and dimensionless index 0.18.  Past Medical History:  Diagnosis Date   Anticoagulated on  Coumadin    followed in CVRR at [facility]   Aortic stenosis    a. Echo 2/15: Severe LVH, EF 55-60%, Gr 1 DD, mild to mod AS (mean 20 mmHg, peak 31 mmHg), AVA 1.4-1.5 cm2, mild AI, MAC, trivial MR, LA upper limits of normal, RVSP 22 mmHg, mild RAE;   b. Echo 2/16:  EF 55-60%, no RWMA, mild AS (mean 17 mmHg, peak 27 mmHg), AVA 1.5 cm2, mild AI, MAC, mild LAE   Arthritis    Bladder neoplasm    Chronic cough    NO CARDIAC OR PULMONARY RELATED   Complication of anesthesia    Coronary artery disease CARDIOLOGIST-  DR KLEIN/ ALLRED   a. s/p CABG 2004, b. LHC with patent grafts 07/2005, ejection fraction 50%;  c. 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 Amiodarone Rx   S/P CABG x 5 2004   Stroke [facility]) 03/10/2020   Stroke in the Left eye    Past Surgical History:  Procedure Laterality Date   CARDIAC CATHETERIZATION  07-26-2005  DR GAMBLE   PRESERVED LVF/  EF 50%/ PATENT GRAFTS   CARDIAC CATHETERIZATION  03-04-2003  DR GAMBLE   SEVERE 3 VESSEL DISEASE   CARDIAC CATHETERIZATION  1991  DR [facility]   PAF/ FALSE POSITIVE STRESS TEST   CAROTID ENDARTERECTOMY Left 2022   CATARACT EXTRACTION W/ INTRAOCULAR LENS IMPLANT Right    CORONARY ARTERY BYPASS GRAFT  03-08-2003  DR [id number]   LIMA TO LAD/ SVG TO OM2/  SVG TO DIAGONAL / SVG TO PDA & PLA   CYSTOSCOPY WITH BIOPSY N/A 12/25/2012   Procedure: CYSTOSCOPY WITH BLADDER BIOPSY;  Surgeon: Fredricka Bonine, MD;  Location: [facility];  Service: Urology;  Laterality: N/A;   ELECTROPHYSIOLOGY STUDY  07-27-2005  DR Carleene Overlie TAYLOR   MAPPING --   RESULT  NONINDUCIBLE VT OR SVT/  DX RIGHT VENTRICULAR OUTFLOW TRACT VT AND RULES OUT MORE MALIGNANT CAUSES OF VT   ENDARTERECTOMY Left 07/08/2020   Procedure: LEFT CAROTID ENDARTERECTOMY;  Surgeon: Serafina Mitchell, MD;  Location: [facility] OR;  Service: Vascular;  Laterality: Left;   INGUINAL HERNIA REPAIR Bilateral [place] ARTHROSCOPY Right 1994   LUMBAR DISC SURGERY  1999   L4 -- L5   MOHS SURGERY     by Dr. Levada Dy 2019   REMOVAL VOCAL CORD POLYPS  1978   TONSILLECTOMY  AS CHILD   TRANSTHORACIC ECHOCARDIOGRAM  08/08/2011   MILD LVH/  EF 99-83%/  GRADE I DIASTOLIC DYSFUNCTION/ MILD AV STENOSIS    Current Medications: Current Meds  Medication Sig   acetaminophen (TYLENOL) 325 MG tablet Take 650 mg by mouth once as needed for moderate pain or headache.   apixaban (ELIQUIS) 5 MG TABS tablet Take 1 tablet (5 mg total) by mouth 2 (two) times daily. Take evening dose starting 07/09/20   brimonidine (ALPHAGAN) 0.2 % ophthalmic solution Place into the left eye 2 (two) times daily.   butalbital-acetaminophen-caffeine (FIORICET, ESGIC) 50-325-40 MG per tablet Take 1 tablet by mouth 3 (three) times daily as needed for migraine.    cetirizine (ZYRTEC) 10 MG tablet Take 1 tablet (10 mg total) by mouth daily.   diltiazem (CARDIZEM CD) 120 MG 24 hr capsule Take 1 capsule (120 mg) by mouth once daily   fluticasone (FLONASE) 50 MCG/ACT nasal spray Place 1 spray into both nostrils daily.   hydrochlorothiazide (HYDRODIURIL) 25 MG tablet Take 1 tablet (25 mg total) by mouth daily.   latanoprost (XALATAN) 0.005 % ophthalmic solution INSTILL 1 DROP IN BOTH EYES AT BEDTIME -  CONTACT PHARMACY WHEN NEW SUPPLY IS NEEDED   melatonin 3 MG TABS tablet Take 3 mg by mouth at bedtime.   Multiple Vitamins-Minerals (MULTIVITAMIN WITH MINERALS) tablet Take 1 tablet by mouth daily.   Polyethyl Glycol-Propyl Glycol (SYSTANE ULTRA OP) Apply 1-2 drops to eye once as needed (dry eyes.).    potassium chloride (K-DUR) 10 MEQ  tablet Take 10 mEq by mouth every morning. Take as directed   pravastatin (PRAVACHOL) 80 MG tablet Take 1 tablet (80 mg total) by mouth every evening.     Allergies:   Ace inhibitors, Zocor [simvastatin], Nsaids, Prednisone, Requip [ropinirole], Cephalexin, Chocolate, Doxycycline, Gabapentin, Hycodan [hydrocodone bit-homatrop mbr], and Oxycodone   Social History   Socioeconomic History   Marital status: Married    Spouse name: [name]   Number of children: 3   Years of education: Not on file   Highest education level: Not on file  Occupational History   Occupation: Retired  Tobacco Use   Smoking  status: Former    Packs/day: 1.00    Years: 25.00    Total pack years: 25.00    Types: Cigarettes    Quit date: 04/25/1980    Years since quitting: 41.8   Smokeless tobacco: Never  Vaping Use   Vaping Use: Never used  Substance and Sexual Activity   Alcohol use: No    Alcohol/week: 0.0 standard drinks of alcohol   Drug use: No   Sexual activity: Not on file  Other Topics Concern   Not on file  Social History Narrative   [employer] '56-'58, overseas to [place]   Some care through [place]   No service disability   Social Determinants of Health   Financial Resource Strain: Low Risk  (10/22/2021)   Overall Financial Resource Strain (CARDIA)    Difficulty of Paying Living Expenses: Not hard at all  Food Insecurity: No Food Insecurity (10/22/2021)   Hunger Vital Sign    Worried About Running Out of Food in the Last Year: Never true    Ran Out of Food in the Last Year: Never true  Transportation Needs: No Transportation Needs (10/22/2021)   PRAPARE - Hydrologist (Medical): No    Lack of Transportation (Non-Medical): No  Physical Activity: Inactive (10/22/2021)   Exercise Vital Sign    Days of Exercise per Week: 0 days    Minutes of Exercise per Session: 0 min  Stress: No Stress Concern Present (10/22/2021)   [place]    Feeling of Stress : Not at all  Social Connections: Not on file     Family History: The patient's family history includes Cancer in his mother. There is no history of Stroke.  ROS:   Please see the history of present illness.    All other systems reviewed and are negative.  EKGs/Labs/Other Studies Reviewed:    The following studies were reviewed today: 2D Echo 01-31-2022: IMPRESSIONS     1. Left ventricular ejection fraction by 3D volume is 65 %. The left  ventricle has normal function. The left ventricle has no regional wall  motion abnormalities. Left ventricular diastolic parameters are  indeterminate.   2. Right ventricular systolic function is normal. The right ventricular  size is mildly enlarged. There is normal pulmonary artery systolic  pressure.   3. Left atrial size was moderately dilated.   4. The mitral valve is normal in structure. Mild mitral valve  regurgitation. No evidence of mitral stenosis.   5. Aortic valve is thickened and heavily calcified. Moderate Aortic  regurgitation. Aortic valve doppler suggest moderate to severe aortic  stenosis. The valve visually is obstructed unable to get a planimetry.  Highly supect Severe aortic stenosis (see DI   and Indexed AVA): AVA 0.95 cm2, mean gradient 35.8 mmHg, Peak velocity  3.97 m/s, DI 0.18, Indexed AVA 0.45 cm2/m2.   6. The inferior vena cava is normal in size with greater than 50%  respiratory variability, suggesting right atrial pressure of 3 mmHg.   Comparison(s): 03/11/20 EF 55-60%. PA pressure 69mHg.   Recent Labs: 06/13/2021: BUN 12; Magnesium 2.2; Potassium 4.1; Sodium 137 08/05/2021: TSH 3.88 10/25/2021: ALT 16; Creatinine 0.9; Hemoglobin 14.2; Platelets 191 02/07/2022: NT-Pro BNP 667  Recent Lipid Panel    Component Value Date/Time   CHOL 170 10/25/2021 0000   CHOL 158 12/20/2017 0000   TRIG 74 10/25/2021 0000   TRIG 95 12/20/2017 0000   HDL 70 10/25/2021 0000  CHOLHDL  2 11/23/2020 1222   VLDL 13.2 11/23/2020 1222   LDLCALC 85 10/25/2021 0000   LDLCALC 74 12/20/2017 0000   LDLDIRECT 88 07/27/2020 1144          Physical Exam:    VS:  BP 126/80   Pulse 82   Ht _0  (1.778 m)   Wt 188 lb 9.6 oz (85.5 kg)   SpO2 96%   BMI 27.06 kg/m     Wt Readings from Last 3 Encounters:  02/07/22 188 lb 9.6 oz (85.5 kg)  01/19/22 186 lb 8 oz (84.6 kg)  12/24/21 190 lb 12.8 oz (86.5 kg)     GEN:  Well nourished, well developed in no acute distress HEENT: Normal NECK: No JVD; No carotid bruits LYMPHATICS: No lymphadenopathy CARDIAC: Irregularly irregular with grade 3/6 mid peaking crescendo decrescendo murmur at the right upper sternal border, A2 is audible. RESPIRATORY:  Clear to auscultation without rales, wheezing or rhonchi  ABDOMEN: Soft, non-tender, non-distended MUSCULOSKELETAL:  No edema; No deformity  SKIN: Warm and dry NEUROLOGIC:  Alert and oriented x 3 PSYCHIATRIC:  Normal affect   ASSESSMENT:    1. Nonrheumatic aortic (valve) stenosis    PLAN:    In order of problems listed above:  The patient has moderate to severe aortic stenosis based on echo and exam criteria.  He does not appear to have any symptoms related to aortic stenosis (stage C disease) and I highly suspect his dizziness is secondary to an occipital stroke that occurred several months ago.  I personally reviewed his echo images and agree that he has moderate to severe aortic stenosis.  We reviewed the natural history of aortic stenosis at length today.  We discussed potential symptoms which include fatigue, shortness of breath, chest discomfort, lightheadedness, and syncope.  We reviewed potential treatment options including clinical surveillance, palliative medical therapy, TAVR, and cardiac surgery.  Considering his age and risk factors, TAVR will be indicated when he requires intervention.  At this time, I think it is probably safest to follow him clinically and perform echo  surveillance in 6 months.  This will give him more time out from his stroke and reserve intervention for development of symptoms related to aortic stenosis.  He understands to contact us if any progressive problems or new symptoms of shortness of breath or chest pain occur.      Medication Adjustments/Labs and Tests Ordered: Current medicines are reviewed at length with the patient today.  Concerns regarding medicines are outlined above.  Orders Placed This Encounter  Procedures   Pro b natriuretic peptide (BNP)   ECHOCARDIOGRAM COMPLETE   No orders of the defined types were placed in this encounter.   Patient Instructions  Medication Instructions:  Your physician recommends that you continue on your current medications as directed. Please refer to the Current Medication list given to you today.  *If you need a refill on your cardiac medications before your next appointment, please call your pharmacy*   Lab Work: BNP Today If you have labs (blood work) drawn today and your tests are completely normal, you will receive your results only by: [place] (if you have MyChart) OR A paper copy in the mail If you have any lab test that is abnormal or we need to change your treatment, we will call you to review the results.   Testing/Procedures: ECHO (prior to next visit in 6 months) Your physician has requested that you have an echocardiogram. Echocardiography is a painless  test that uses sound waves to create images of your heart. It provides your doctor with information about the size and shape of your heart and how well your heart's chambers and valves are working. This procedure takes approximately one hour. There are no restrictions for this procedure. Please do NOT wear cologne, perfume, aftershave, or lotions (deodorant is allowed). Please arrive 15 minutes prior to your appointment time.  Follow-Up: At [facility], you and your health needs are our priority.  As  part of our continuing mission to provide you with exceptional heart care, we have created designated Provider Care Teams.  These Care Teams include your primary Cardiologist (physician) and Advanced Practice Providers (APPs -  Physician Assistants and Nurse Practitioners) who all work together to provide you with the care you need, when you need it.  Your next appointment:   6 month(s)  The format for your next appointment:   In Person  Provider:   Sherren Mocha, MD     Important Information About Sugar         Signed, Sherren Mocha, MD  02/16/2022 11:31 AM    [place]

## 2022-02-08 LAB — PRO B NATRIURETIC PEPTIDE: NT-Pro BNP: 667 pg/mL — ABNORMAL HIGH (ref 0–486)

## 2022-02-09 ENCOUNTER — Telehealth: Payer: Self-pay | Admitting: Internal Medicine

## 2022-02-09 NOTE — Telephone Encounter (Signed)
Pt calling to f/u on lab results. Please advise 

## 2022-02-09 NOTE — Telephone Encounter (Signed)
Pt aware BNP has not been reviewed Will call with recommendations once Dr Burt Knack reviews lab ./cy

## 2022-02-14 DIAGNOSIS — H3562 Retinal hemorrhage, left eye: Secondary | ICD-10-CM | POA: Diagnosis not present

## 2022-02-14 DIAGNOSIS — H532 Diplopia: Secondary | ICD-10-CM | POA: Diagnosis not present

## 2022-02-14 DIAGNOSIS — Z961 Presence of intraocular lens: Secondary | ICD-10-CM | POA: Diagnosis not present

## 2022-02-14 DIAGNOSIS — H40052 Ocular hypertension, left eye: Secondary | ICD-10-CM | POA: Diagnosis not present

## 2022-02-14 DIAGNOSIS — H401112 Primary open-angle glaucoma, right eye, moderate stage: Secondary | ICD-10-CM | POA: Diagnosis not present

## 2022-02-14 DIAGNOSIS — H34212 Partial retinal artery occlusion, left eye: Secondary | ICD-10-CM | POA: Diagnosis not present

## 2022-02-14 DIAGNOSIS — H0102A Squamous blepharitis right eye, upper and lower eyelids: Secondary | ICD-10-CM | POA: Diagnosis not present

## 2022-02-14 DIAGNOSIS — H0102B Squamous blepharitis left eye, upper and lower eyelids: Secondary | ICD-10-CM | POA: Diagnosis not present

## 2022-02-14 DIAGNOSIS — H43813 Vitreous degeneration, bilateral: Secondary | ICD-10-CM | POA: Diagnosis not present

## 2022-02-16 ENCOUNTER — Encounter: Payer: Self-pay | Admitting: Cardiovascular Disease

## 2022-02-22 DIAGNOSIS — I4821 Permanent atrial fibrillation: Secondary | ICD-10-CM | POA: Insufficient documentation

## 2022-02-23 ENCOUNTER — Ambulatory Visit: Payer: Medicare Other | Attending: Internal Medicine | Admitting: Internal Medicine

## 2022-02-23 ENCOUNTER — Encounter: Payer: Self-pay | Admitting: Internal Medicine

## 2022-02-23 VITALS — BP 169/95 | HR 86 | Ht 70.0 in | Wt 190.0 lb

## 2022-02-23 DIAGNOSIS — I639 Cerebral infarction, unspecified: Secondary | ICD-10-CM

## 2022-02-23 DIAGNOSIS — I4821 Permanent atrial fibrillation: Secondary | ICD-10-CM | POA: Diagnosis not present

## 2022-02-23 DIAGNOSIS — R55 Syncope and collapse: Secondary | ICD-10-CM

## 2022-02-23 MED ORDER — DILTIAZEM HCL ER COATED BEADS 120 MG PO CP24: 120.0000 mg | ORAL_CAPSULE | Freq: Two times a day (BID) | ORAL | 3 refills | Status: DC

## 2022-02-23 NOTE — Patient Instructions (Signed)
Medication Instructions:  Your physician has recommended you make the following change in your medication:   Increase Diltiazem to '120mg'$  - 1 tablet by mouth twice daily.    *If you need a refill on your cardiac medications before your next appointment, please call your pharmacy*   Lab Work: None ordered.  If you have labs (blood work) drawn today and your tests are completely normal, you will receive your results only by: [place] (if you have MyChart) OR A paper copy in the mail If you have any lab test that is abnormal or we need to change your treatment, we will call you to review the results.   Testing/Procedures: None ordered.    Follow-Up: At [facility], you and your health needs are our priority.  As part of our continuing mission to provide you with exceptional heart care, we have created designated Provider Care Teams.  These Care Teams include your primary Cardiologist (physician) and Advanced Practice Providers (APPs -  Physician Assistants and Nurse Practitioners) who all work together to provide you with the care you need, when you need it.  We recommend signing up for the patient portal called "MyChart".  Sign up information is provided on this After Visit Summary.  MyChart is used to connect with patients for Virtual Visits (Telemedicine).  Patients are able to view lab/test results, encounter notes, upcoming appointments, etc.  Non-urgent messages can be sent to your provider as well.   To learn more about what you can do with MyChart, go to [email or web address].    Your next appointment:   12 months with Dr Caryl Comes  Please send [place] BP readings in 2 weeks.  Important Information About Sugar

## 2022-02-23 NOTE — Progress Notes (Signed)
Patient Care Team: Tonia Ghent, MD as PCP - General (Family Medicine) Deboraha Sprang, MD as PCP - Cardiology (Cardiology) [name], [facility] as Pharmacist (Pharmacist)   HPI  [name] [name] is a 87 y.o. male Seen in followup for atrial fibrillation now permanent and VT-RVOT previously treated with amiodarone. Also AoStenosis moderate and seen recently by Dr [facility] Ischemic heart disease with prior bypass surgery 2004 Catheterization 2007 demonstrated patent grafts  History of post micturition syncope   He underwent monitoring after initial visit to clarify the presence of atrial fibrillation.This prompted reinitiation of his warfarin and his amiodarone. He had  seen Dr. Rayann Heman to consider catheter ablation but decided that he would continue on amiodarone.  Now discontinued  Interval stroke 11/21.  At that point aspirin was added to his Eliquis.  Statin for secondary prevention Endarterectomy 3/22 Ongoing dizziness following another stroke which involve the brainstem  4/23  CNS imaging concerning initially for CNS lymphoma but abnormalities resolved  The patient denies chest pain, shortness of breath, nocturnal dyspnea, orthopnea or peripheral edema.  There have been no palpitations or syncope.  Complains of dizziness.   Also complaining of nocturnal thigh pain, no discomfort in his shoulders.   DATE TEST EF   2/16 Echo  55-60 % AS mild Gradient 17   11/21 Echo  55-60% AS Mean Grad 24  10/23 Echo  65% AS Mean Grad 36      Date TSH LFTs PFTs  1/15  4.17     11/17  3.63 11                     Past Medical History:  Diagnosis Date   Anticoagulated on Coumadin    followed in CVRR at [facility]   Aortic stenosis    a. Echo 2/15: Severe LVH, EF 55-60%, Gr 1 DD, mild to mod AS (mean 20 mmHg, peak 31 mmHg), AVA 1.4-1.5 cm2, mild AI, MAC, trivial MR, LA upper limits of normal, RVSP 22 mmHg, mild RAE;   b. Echo 2/16:  EF 55-60%, no RWMA, mild AS (mean 17  mmHg, peak 27 mmHg), AVA 1.5 cm2, mild AI, MAC, mild LAE   Arthritis    Bladder neoplasm    Chronic cough    NO CARDIAC OR PULMONARY RELATED   Complication of anesthesia    Coronary artery disease CARDIOLOGIST-  DR Ky Rumple/ [facility]RED   a. s/p CABG 2004, b. LHC with patent grafts 07/2005, ejection fraction 50%;  c. 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 Amiodarone Rx   S/P CABG x 5 2004   Stroke [facility]) 03/10/2020   Stroke in the Left eye    Past Surgical History:  Procedure Laterality Date   CARDIAC CATHETERIZATION  07-26-2005  DR GAMBLE   PRESERVED LVF/  EF 50%/ PATENT GRAFTS   CARDIAC CATHETERIZATION  03-04-2003  DR GAMBLE   SEVERE 3 VESSEL DISEASE   CARDIAC CATHETERIZATION  1991  DR Rollene Fare   PAF/ FALSE POSITIVE STRESS TEST  CAROTID ENDARTERECTOMY Left 2022   CATARACT EXTRACTION W/ INTRAOCULAR LENS IMPLANT Right    CORONARY ARTERY BYPASS GRAFT  03-08-2003  DR [id number]   LIMA TO LAD/ SVG TO OM2/  SVG TO DIAGONAL / SVG TO PDA & PLA   CYSTOSCOPY WITH BIOPSY N/A 12/25/2012   Procedure: CYSTOSCOPY WITH BLADDER BIOPSY;  Surgeon: Fredricka Bonine, MD;  Location[facility];  Service: Urology;  Laterality: N/A;   ELECTROPHYSIOLOGY STUDY  07-27-2005  DR Carleene Overlie TAYLOR   MAPPING --   RESULT NONINDUCIBLE VT OR SVT/  DX RIGHT VENTRICULAR OUTFLOW TRACT VT AND RULES OUT MORE MALIGNANT CAUSES OF VT   ENDARTERECTOMY Left 07/08/2020   Procedure: LEFT CAROTID ENDARTERECTOMY;  Surgeon: Serafina Mitchell, MD;  Location: [facility] OR;  Service: Vascular;  Laterality: Left;   INGUINAL HERNIA REPAIR Bilateral  [place] ARTHROSCOPY Right 1994   LUMBAR DISC SURGERY  1999   L4 -- L5   MOHS SURGERY     by Dr. Levada Dy 2019   REMOVAL VOCAL CORD POLYPS  1978   TONSILLECTOMY  AS CHILD   TRANSTHORACIC ECHOCARDIOGRAM  08/08/2011   MILD LVH/  EF 40-10%/  GRADE I DIASTOLIC DYSFUNCTION/ MILD AV STENOSIS    Current Outpatient Medications  Medication Sig Dispense Refill   acetaminophen (TYLENOL) 325 MG tablet Take 650 mg by mouth once as needed for moderate pain or headache.     apixaban (ELIQUIS) 5 MG TABS tablet Take 1 tablet (5 mg total) by mouth 2 (two) times daily. Take evening dose starting 07/09/20 180 tablet 3   brimonidine (ALPHAGAN) 0.2 % ophthalmic solution Place into the left eye 2 (two) times daily.     butalbital-acetaminophen-caffeine (FIORICET, ESGIC) 50-325-40 MG per tablet Take 1 tablet by mouth 3 (three) times daily as needed for migraine.      cetirizine (ZYRTEC) 10 MG tablet Take 1 tablet (10 mg total) by mouth daily.     diltiazem (CARDIZEM CD) 120 MG 24 hr capsule Take 1 capsule (120 mg) by mouth once daily     fluticasone (FLONASE) 50 MCG/ACT nasal spray Place 1 spray into both nostrils daily. 16 g 1   hydrochlorothiazide (HYDRODIURIL) 25 MG tablet Take 1 tablet (25 mg total) by mouth daily. 90 tablet 2   latanoprost (XALATAN) 0.005 % ophthalmic solution INSTILL 1 DROP IN BOTH EYES AT BEDTIME -  CONTACT PHARMACY WHEN NEW SUPPLY IS NEEDED     melatonin 3 MG TABS tablet Take 3 mg by mouth at bedtime.     Multiple Vitamins-Minerals (MULTIVITAMIN WITH MINERALS) tablet Take 1 tablet by mouth daily.     Polyethyl Glycol-Propyl Glycol (SYSTANE ULTRA OP) Apply 1-2 drops to eye once as needed (dry eyes.).      potassium chloride (K-DUR) 10 MEQ tablet Take 10 mEq by mouth every morning. Take as directed     pravastatin (PRAVACHOL) 80 MG tablet Take 1 tablet (80 mg total) by mouth every evening. 90 tablet 0   No current facility-administered medications for this visit.    Allergies   Allergen Reactions   Ace Inhibitors Other (See Comments)    COUGH   Zocor [Simvastatin] Other (See Comments)    MYALGIA   Nsaids     Would avoid given anticoagulation   Prednisone Other (See Comments)    Prednisone caused pt to have elevated BP.   Requip [Ropinirole]     didn't sleep well and had trouble with abnormal dreams  Cephalexin Rash   Chocolate     Headaches    Doxycycline Other (See Comments)    Nausea and abd pain   Gabapentin Other (See Comments)    Dizziness   Hycodan [Hydrocodone Bit-Homatrop Mbr] Nausea And Vomiting   Oxycodone Nausea Only    Pt states that he cannot take prescription pain medication    Review of Systems negative except from HPI and PMH  Physical Exam BP 114/60   Pulse 78   Ht _0  (1.778 m)   Wt 190 lb (86.2 kg)   SpO2 95%   BMI 27.26 kg/m  Well developed and nourished in no acute distress HENT normal Neck supple with JVP-flat Carotids diminished Clear Irregularly irregular rate and rhythm with controlled ventricular response, 3/6 systolic murmur abd-soft with active BS without hepatomegaly No Clubbing cyanosis edema Skin-warm and dry A & Oriented  Grossly normal sensory and motor function  ECG atrial fibrillation at 78 Interval-/13/43  Assessment and  Plan  Aortic stenosis  Mod  Atrial fibrillation-Permanent   Hypertension  Ischemic heart disease  S/p CABG  With near normal LV function  Syncope  Orthostatic lightheadedness   Permanent atrial fibrillation reasonable rate control.  Dizziness is attributed to stroke not withstanding his positional nature; orthostatic vital signs were notable mostly for hypertension both systolic and diastolic.  In this regard we will have him increase his Cardizem from 120--240 daily in divided doses; we will keep his hydrochlorothiazide at 25 mg.  No angina.  No bleeding, continue apixaban dosed on weight and renal function  He remains on a statin LDL remains elevated at 85.   Would be inclined to switch him to a underfilling statin i.e. rosuvastatin

## 2022-02-24 DIAGNOSIS — R3121 Asymptomatic microscopic hematuria: Secondary | ICD-10-CM | POA: Diagnosis not present

## 2022-02-24 DIAGNOSIS — R350 Frequency of micturition: Secondary | ICD-10-CM | POA: Diagnosis not present

## 2022-02-24 DIAGNOSIS — N401 Enlarged prostate with lower urinary tract symptoms: Secondary | ICD-10-CM | POA: Diagnosis not present

## 2022-02-24 DIAGNOSIS — N200 Calculus of kidney: Secondary | ICD-10-CM | POA: Diagnosis not present

## 2022-03-10 ENCOUNTER — Encounter (INDEPENDENT_AMBULATORY_CARE_PROVIDER_SITE_OTHER): Payer: Medicare Other | Admitting: Ophthalmology

## 2022-03-10 ENCOUNTER — Encounter: Payer: Self-pay | Admitting: Internal Medicine

## 2022-03-10 DIAGNOSIS — I10 Essential (primary) hypertension: Secondary | ICD-10-CM

## 2022-03-10 DIAGNOSIS — H35033 Hypertensive retinopathy, bilateral: Secondary | ICD-10-CM | POA: Diagnosis not present

## 2022-03-10 DIAGNOSIS — H348322 Tributary (branch) retinal vein occlusion, left eye, stable: Secondary | ICD-10-CM

## 2022-03-10 DIAGNOSIS — H34232 Retinal artery branch occlusion, left eye: Secondary | ICD-10-CM

## 2022-03-10 DIAGNOSIS — H43813 Vitreous degeneration, bilateral: Secondary | ICD-10-CM | POA: Diagnosis not present

## 2022-03-16 DIAGNOSIS — N200 Calculus of kidney: Secondary | ICD-10-CM | POA: Diagnosis not present

## 2022-03-16 DIAGNOSIS — R1084 Generalized abdominal pain: Secondary | ICD-10-CM | POA: Diagnosis not present

## 2022-03-16 DIAGNOSIS — Z87442 Personal history of urinary calculi: Secondary | ICD-10-CM | POA: Diagnosis not present

## 2022-03-23 NOTE — Telephone Encounter (Signed)
Blood pressure measurements look terrific.  We will just continue doing what you are doing

## 2022-03-28 ENCOUNTER — Ambulatory Visit (INDEPENDENT_AMBULATORY_CARE_PROVIDER_SITE_OTHER): Payer: Medicare Other | Admitting: Family

## 2022-03-28 ENCOUNTER — Encounter: Payer: Self-pay | Admitting: Family

## 2022-03-28 VITALS — BP 142/80 | HR 97 | Temp 98.3°F | Resp 16 | Ht 70.0 in | Wt 193.2 lb

## 2022-03-28 DIAGNOSIS — I639 Cerebral infarction, unspecified: Secondary | ICD-10-CM

## 2022-03-28 DIAGNOSIS — J069 Acute upper respiratory infection, unspecified: Secondary | ICD-10-CM | POA: Diagnosis not present

## 2022-03-28 DIAGNOSIS — Z20822 Contact with and (suspected) exposure to covid-19: Secondary | ICD-10-CM | POA: Insufficient documentation

## 2022-03-28 MED ORDER — AMOXICILLIN 875 MG PO TABS: 875.0000 mg | ORAL_TABLET | Freq: Two times a day (BID) | ORAL | 0 refills | Status: AC

## 2022-03-28 NOTE — Progress Notes (Signed)
Established Patient Office Visit  Subjective:  Patient ID: [name] [name], male    DOB: 16-Nov-1934  Age: 87 y.o. MRN: [id number]  CC:  Chief Complaint  Patient presents with   Sore Throat    X last Wednesday   Cough    HPI [name] [name] is here today with concerns.   5 days ago started with a sore throat and has progressed to having a cough worse with lying in bed, and keeping him up at night. The cough is productive at times when he gets to coughing bad. Feeling hoarse with all of the coughing. He is not wheezing or short of breath. Does have some nasal congestion. Slight maxillary tenderness. No ear pain. No fever or chills.   Has taken some tussin DM with no relief of symptoms.   Past Medical History:  Diagnosis Date   Anticoagulated on Coumadin    followed in CVRR at [facility]   Aortic stenosis    a. Echo 2/15: Severe LVH, EF 55-60%, Gr 1 DD, mild to mod AS (mean 20 mmHg, peak 31 mmHg), AVA 1.4-1.5 cm2, mild AI, MAC, trivial MR, LA upper limits of normal, RVSP 22 mmHg, mild RAE;   b. Echo 2/16:  EF 55-60%, no RWMA, mild AS (mean 17 mmHg, peak 27 mmHg), AVA 1.5 cm2, mild AI, MAC, mild LAE   Arthritis    Bladder neoplasm    Chronic cough    NO CARDIAC OR PULMONARY RELATED   Complication of anesthesia    Coronary artery disease CARDIOLOGIST-  DR KLEIN/ ALLRED   a. s/p CABG 2004, b. LHC with patent grafts 07/2005, ejection fraction 50%;  c. 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 Amiodarone Rx   S/P CABG x 5 2004   Stroke [facility]) 03/10/2020   Stroke in the Left eye    Past  Surgical History:  Procedure Laterality Date   CARDIAC CATHETERIZATION  07-26-2005  DR GAMBLE   PRESERVED LVF/  EF 50%/ PATENT GRAFTS   CARDIAC CATHETERIZATION  03-04-2003  DR GAMBLE   SEVERE 3 VESSEL DISEASE   CARDIAC CATHETERIZATION  1991  DR [facility]   PAF/ FALSE POSITIVE STRESS TEST   CAROTID ENDARTERECTOMY Left 2022   CATARACT EXTRACTION W/ INTRAOCULAR LENS IMPLANT Right    CORONARY ARTERY BYPASS GRAFT  03-08-2003  DR [id number]   LIMA TO LAD/ SVG TO OM2/  SVG TO DIAGONAL / SVG TO PDA & PLA   CYSTOSCOPY WITH BIOPSY N/A 12/25/2012   Procedure: CYSTOSCOPY WITH BLADDER BIOPSY;  Surgeon: Fredricka Bonine, MD;  Location: [facility];  Service: Urology;  Laterality: N/A;   ELECTROPHYSIOLOGY STUDY  07-27-2005  DR Carleene Overlie TAYLOR   MAPPING --   RESULT NONINDUCIBLE VT OR SVT/  DX RIGHT VENTRICULAR OUTFLOW TRACT VT AND RULES OUT MORE MALIGNANT CAUSES OF VT   ENDARTERECTOMY Left 07/08/2020   Procedure: LEFT CAROTID ENDARTERECTOMY;  Surgeon: Serafina Mitchell, MD;  Location: [place];  Service: Vascular;  Laterality: Left;   INGUINAL HERNIA REPAIR Bilateral 1954  &  1990   KNEE ARTHROSCOPY Right 1994   LUMBAR DISC SURGERY  1999   L4 -- L5   MOHS SURGERY     by Dr. Levada Dy 2019   REMOVAL VOCAL CORD POLYPS  1978   TONSILLECTOMY  AS CHILD   TRANSTHORACIC ECHOCARDIOGRAM  08/08/2011   MILD LVH/  EF 00-76%/  GRADE I DIASTOLIC DYSFUNCTION/ MILD AV STENOSIS    Family History  Problem Relation Age of Onset   Cancer Mother    Stroke Neg Hx     Social History   Socioeconomic History   Marital status: Married    Spouse name: [name]   Number of children: 3   Years of education: Not on file   Highest education level: Not on file  Occupational History   Occupation: Retired  Tobacco Use   Smoking status: Former    Packs/day: 1.00    Years: 25.00    Total pack years: 25.00    Types: Cigarettes    Quit date: 04/25/1980    Years since quitting: 41.9   Smokeless tobacco: Never   Vaping Use   Vaping Use: Never used  Substance and Sexual Activity   Alcohol use: No    Alcohol/week: 0.0 standard drinks of alcohol   Drug use: No   Sexual activity: Not on file  Other Topics Concern   Not on file  Social History Narrative   [employer]-'58, overseas to [place]   Some care through [place]   No service disability   Social Determinants of Health   Financial Resource Strain: Low Risk  (10/22/2021)   Overall Financial Resource Strain (CARDIA)    Difficulty of Paying Living Expenses: Not hard at all  Food Insecurity: No Food Insecurity (10/22/2021)   Hunger Vital Sign    Worried About Running Out of Food in the Last Year: Never true    [place] in the Last Year: Never true  Transportation Needs: No Transportation Needs (10/22/2021)   PRAPARE - Hydrologist (Medical): No    Lack of Transportation (Non-Medical): No  Physical Activity: Inactive (10/22/2021)   Exercise Vital Sign    Days of Exercise per Week: 0 days    Minutes of Exercise per Session: 0 min  Stress: No Stress Concern Present (10/22/2021)   [place]    Feeling of Stress : Not at all  Social Connections: Not on file  Intimate Partner Violence: Not At Risk (10/19/2020)   Humiliation, Afraid, Rape, and Kick questionnaire    Fear of Current or Ex-Partner: No    Emotionally Abused: No    Physically Abused: No    Sexually Abused: No    Outpatient Medications Prior to Visit  Medication Sig Dispense Refill   acetaminophen (TYLENOL) 325 MG tablet Take 650 mg by mouth once as needed for moderate pain or headache.     apixaban (ELIQUIS) 5 MG TABS tablet Take 1 tablet (5 mg total) by mouth 2 (two) times daily. Take evening dose starting 07/09/20 180 tablet 3   brimonidine (ALPHAGAN) 0.2 % ophthalmic solution Place into the left eye 2 (two) times daily.     butalbital-acetaminophen-caffeine (FIORICET, ESGIC) 50-325-40  MG per tablet Take 1 tablet by mouth 3 (three) times daily as needed for migraine.      cetirizine (ZYRTEC) 10 MG tablet Take 1 tablet (10 mg total) by mouth daily.     diltiazem (CARDIZEM CD) 120 MG 24  hr capsule Take 1 capsule (120 mg total) by mouth 2 (two) times daily. 180 capsule 3   fluticasone (FLONASE) 50 MCG/ACT nasal spray Place 1 spray into both nostrils daily. 16 g 1   hydrochlorothiazide (HYDRODIURIL) 25 MG tablet Take 1 tablet (25 mg total) by mouth daily. 90 tablet 2   latanoprost (XALATAN) 0.005 % ophthalmic solution INSTILL 1 DROP IN BOTH EYES AT BEDTIME -  CONTACT PHARMACY WHEN NEW SUPPLY IS NEEDED     melatonin 3 MG TABS tablet Take 3 mg by mouth at bedtime.     Multiple Vitamins-Minerals (MULTIVITAMIN WITH MINERALS) tablet Take 1 tablet by mouth daily.     Polyethyl Glycol-Propyl Glycol (SYSTANE ULTRA OP) Apply 1-2 drops to eye once as needed (dry eyes.).      potassium chloride (K-DUR) 10 MEQ tablet Take 10 mEq by mouth every morning. Take as directed     pravastatin (PRAVACHOL) 80 MG tablet Take 1 tablet (80 mg total) by mouth every evening. 90 tablet 0   No facility-administered medications prior to visit.    Allergies  Allergen Reactions   Ace Inhibitors Other (See Comments)    COUGH   Zocor [Simvastatin] Other (See Comments)    MYALGIA   Nsaids     Would avoid given anticoagulation   Prednisone Other (See Comments)    Prednisone caused pt to have elevated BP.   Requip [Ropinirole]     didn't sleep well and had trouble with abnormal dreams   Cephalexin Rash   Chocolate     Headaches    Doxycycline Other (See Comments)    Nausea and abd pain   Gabapentin Other (See Comments)    Dizziness   Hycodan [Hydrocodone Bit-Homatrop Mbr] Nausea And Vomiting   Oxycodone Nausea Only    Pt states that he cannot take prescription pain medication        Objective:    Physical Exam Constitutional:      General: He is awake. He is not in acute distress.     Appearance: Normal appearance. He is well-developed. He is not ill-appearing.  HENT:     Right Ear: Tympanic membrane normal.     Left Ear: Tympanic membrane normal.     Nose: Nose normal.     Right Turbinates: Not enlarged or swollen.     Left Turbinates: Not enlarged or swollen.     Right Sinus: No maxillary sinus tenderness or frontal sinus tenderness.     Left Sinus: No maxillary sinus tenderness or frontal sinus tenderness.     Mouth/Throat:     Mouth: Mucous membranes are moist.     Pharynx: No pharyngeal swelling, oropharyngeal exudate or posterior oropharyngeal erythema.  Eyes:     Extraocular Movements: Extraocular movements intact.     Pupils: Pupils are equal, round, and reactive to light.  Cardiovascular:     Rate and Rhythm: Normal rate and regular rhythm.  Pulmonary:     Effort: Pulmonary effort is normal.     Breath sounds: Normal breath sounds. No wheezing.  Neurological:     Mental Status: He is alert.     BP (!) 142/80   Pulse 97   Temp 98.3 F (36.8 C)   Resp 16   Ht _0  (1.778 m)   Wt 193 lb 4 oz (87.7 kg)   SpO2 95%   BMI 27.73 kg/m  Wt Readings from Last 3 Encounters:  03/28/22 193 lb 4 oz (87.7 kg)  02/23/22 190 lb (  86.2 kg)  02/07/22 188 lb 9.6 oz (85.5 kg)     Health Maintenance Due  Topic Date Due   INFLUENZA VACCINE  11/23/2021   COVID-19 Vaccine (5 - 2023-24 season) 12/24/2021    There are no preventive care reminders to display for this patient.  Lab Results  Component Value Date   TSH 3.88 08/05/2021   Lab Results  Component Value Date   WBC 6.0 10/25/2021   HGB 14.2 10/25/2021   HCT 40.4 06/13/2021   MCV 95.5 06/13/2021   PLT 191 10/25/2021   Lab Results  Component Value Date   NA 137 06/13/2021   K 4.1 06/13/2021   CO2 28 06/13/2021   GLUCOSE 102 (H) 06/13/2021   BUN 12 06/13/2021   CREATININE 0.9 10/25/2021   BILITOT 1.0 06/13/2021   ALKPHOS 80 10/25/2021   AST 21 10/25/2021   ALT 16 10/25/2021   PROT 7.2  06/13/2021   ALBUMIN 4.8 07/27/2021   CALCIUM 9.1 06/13/2021   ANIONGAP 10 06/13/2021   GFR 76.59 10/27/2020   Lab Results  Component Value Date   HGBA1C 5.9 10/27/2020      Assessment & Plan:   Problem List Items Addressed This Visit       Respiratory   Upper respiratory infection, acute - Primary    Pt states tolerates pcn ok, hesitant to use augentin due to cefdinir reaction If in 2-3 no improvement may consider alternative such as zpack  Tested covid negative in office.       Relevant Medications   amoxicillin (AMOXIL) 875 MG tablet   Other Relevant Orders   POC COVID-19 BinaxNow     Other   RESOLVED: Suspected COVID-19 virus infection    Meds ordered this encounter  Medications   amoxicillin (AMOXIL) 875 MG tablet    Sig: Take 1 tablet (875 mg total) by mouth 2 (two) times daily for 10 days.    Dispense:  20 tablet    Refill:  0    Order Specific Question:   Supervising Provider    Answer:   Diona Browner, AMY E [[id number]]    Follow-up: Return for f/u with primary care provider if no improvement.    Eugenia Pancoast, FNP

## 2022-03-28 NOTE — Patient Instructions (Addendum)
  I would stay away from DM and or expectorant.  I would stay with cordecedin or HBP in the future for cold medications, safer for blood pressure and your heart.   Take antibiotic as prescribed. Increase oral fluids. Pt to f/u if sx worsen and or fail to improve in 2-3 days.   Regards,   Eugenia Pancoast FNP-C

## 2022-03-28 NOTE — Assessment & Plan Note (Addendum)
Pt states tolerates pcn ok, hesitant to use augentin due to cefdinir reaction If in 2-3 no improvement may consider alternative such as zpack  Tested covid negative in office.

## 2022-03-29 ENCOUNTER — Ambulatory Visit: Payer: Medicare Other | Admitting: Family Medicine

## 2022-03-29 LAB — POC COVID19 BINAXNOW: SARS Coronavirus 2 Ag: NEGATIVE

## 2022-03-31 ENCOUNTER — Ambulatory Visit (INDEPENDENT_AMBULATORY_CARE_PROVIDER_SITE_OTHER): Payer: Medicare Other | Admitting: Neurology

## 2022-03-31 ENCOUNTER — Telehealth: Payer: Self-pay | Admitting: Neurology

## 2022-03-31 ENCOUNTER — Encounter: Payer: Self-pay | Admitting: Neurology

## 2022-03-31 VITALS — BP 132/82 | HR 85 | Ht 70.0 in | Wt 193.6 lb

## 2022-03-31 DIAGNOSIS — I639 Cerebral infarction, unspecified: Secondary | ICD-10-CM

## 2022-03-31 DIAGNOSIS — J392 Other diseases of pharynx: Secondary | ICD-10-CM

## 2022-03-31 DIAGNOSIS — M545 Low back pain, unspecified: Secondary | ICD-10-CM | POA: Diagnosis not present

## 2022-03-31 DIAGNOSIS — M47818 Spondylosis without myelopathy or radiculopathy, sacral and sacrococcygeal region: Secondary | ICD-10-CM | POA: Diagnosis not present

## 2022-03-31 DIAGNOSIS — R42 Dizziness and giddiness: Secondary | ICD-10-CM

## 2022-03-31 DIAGNOSIS — R0989 Other specified symptoms and signs involving the circulatory and respiratory systems: Secondary | ICD-10-CM | POA: Diagnosis not present

## 2022-03-31 DIAGNOSIS — G8929 Other chronic pain: Secondary | ICD-10-CM | POA: Diagnosis not present

## 2022-03-31 DIAGNOSIS — C8599 Non-Hodgkin lymphoma, unspecified, extranodal and solid organ sites: Secondary | ICD-10-CM

## 2022-03-31 NOTE — Telephone Encounter (Signed)
Referral for ENT fax to [name],. Phone: [phone], Fax: [phone]

## 2022-03-31 NOTE — Patient Instructions (Addendum)
SI joint point tenderness om the right: Dr. Beryl Meager SI joint injections Low back pain, established with Dr. Ellene Route had 2 surgeries: No radiculopathy, I think his back pain is in the SI joint but patient requests to see Dr. Ellene Route as well Send to ENT Dr. Benjamine Mola  Can follow up with   Orders Placed This Encounter  Procedures   Ambulatory referral to Neurosurgery   Ambulatory referral to Pain Clinic   Ambulatory referral to ENT     Sacroiliac joint arthritis   Sacroiliac Joint Dysfunction  Sacroiliac joint dysfunction is a condition that causes inflammation on one or both sides of the sacroiliac (SI) joint. The SI joint is the joint between two bones of the pelvis called the sacrum and the ilium. The sacrum is the bone at the base of the spine. The ilium is the large bone that forms the hip. This condition causes deep aching or burning pain in the low back. In some cases, the pain may also spread into one or both buttocks, hips, or thighs. What are the causes? This condition may be caused by: Pregnancy. During pregnancy, extra stress is put on the SI joints because the pelvis widens. Injury, such as: Injuries from car crashes. Sports-related injuries. Work-related injuries. Having one leg that is shorter than the other. Conditions that affect the joints, such as: Rheumatoid arthritis. Gout. Psoriatic arthritis. Joint infection (septic arthritis). Sometimes, the cause of SI joint dysfunction is not known. What are the signs or symptoms? Symptoms of this condition include: Aching or burning pain in the lower back. The pain may also spread to other areas, such as: Buttocks. Groin. Thighs. Muscle spasms in or around the painful areas. Increased pain when standing, walking, running, stair climbing, bending, or lifting. How is this diagnosed? This condition is diagnosed with a physical exam and your medical history. During the exam, the health care provider may move one or both of  your legs to different positions to check for pain. Various tests may be done to confirm the diagnosis, including: Imaging tests to look for other causes of pain. These may include: MRI. CT scan. Bone scan. Diagnostic injection. A numbing medicine is injected into the SI joint using a needle. If your pain is temporarily improved or stopped after the injection, this can indicate that SI joint dysfunction is the problem. How is this treated? Treatment depends on the cause and severity of your condition. Treatment options can be noninvasive and may include: Ice or heat applied to the lower back area after an injury. This may help reduce pain and muscle spasms. Medicines to relieve pain or inflammation or to relax the muscles. Wearing a back brace (sacroiliac brace) to help support the joint while your back is healing. Physical therapy to increase muscle strength around the joint and flexibility at the joint. This may also involve learning proper body positions and ways of moving to relieve stress on the joint. Direct manipulation of the SI joint. Use of a device that provides electrical stimulation to help reduce pain at the joint. Other treatments may include: Injections of steroid medicine into the joint to reduce pain and swelling. Radiofrequency ablation. This treatment uses heat to burn away nerves that are carrying pain messages from the joint. Surgery to put in screws and plates that limit or prevent joint motion. This is rare. Follow these instructions at home: Medicines Take over-the-counter and prescription medicines only as told by your health care provider. Ask your health care provider if the  medicine prescribed to you: Requires you to avoid driving or using machinery. Can cause constipation. You may need to take these actions to prevent or treat constipation: Drink enough fluid to keep your urine pale yellow. Take over-the-counter or prescription medicines. Eat foods that are  high in fiber, such as beans, whole grains, and fresh fruits and vegetables. Limit foods that are high in fat and processed sugars, such as fried or sweet foods. If you have a brace: Wear the brace as told by your health care provider. Remove it only as told by your health care provider. Keep the brace clean. If the brace is not waterproof: Do not let it get wet. Cover it with a watertight covering when you take a bath or a shower. Managing pain, stiffness, and swelling     Icing can help with pain and swelling. Heat may help with muscle tension or spasms. Ask your health care provider if you should use ice or heat. If directed, put ice on the affected area: If you have a removable brace, remove it as told by your health care provider. Put ice in a plastic bag. Place a towel between your skin and the bag. Leave the ice on for 20 minutes, 2-3 times a day. Remove the ice if your skin turns bright red. This is very important. If you cannot feel pain, heat, or cold, you have a greater risk of damage to the area. If directed, apply heat to the affected area as often as told by your health care provider. Use the heat source that your health care provider recommends, such as a moist heat pack or a heating pad. Place a towel between your skin and the heat source. Leave the heat on for 20-30 minutes. Remove the heat if your skin turns bright red. This is especially important if you are unable to feel pain, heat, or cold. You may have a greater risk of getting burned. General instructions Rest as needed. Return to your normal activities as told by your health care provider. Ask your health care provider what activities are safe for you. Do exercises as told by your health care provider or physical therapist. Keep all follow-up visits. This is important. Contact a health care provider if: Your pain is not controlled with medicine. You have a fever. Your pain is getting worse. Get help right away  if: You have weakness, numbness, or tingling in your legs or feet. You lose control of your bladder or bowels. Summary Sacroiliac (SI) joint dysfunction is a condition that causes inflammation on one or both sides of the SI joint. This condition causes deep aching or burning pain in the low back. In some cases, the pain may also spread into one or both buttocks, hips, or thighs. Treatment depends on the cause and severity of your condition. It may include medicines to reduce pain and swelling or to relax muscles. This information is not intended to replace advice given to you by your health care provider. Make sure you discuss any questions you have with your health care provider. Document Revised: 08/22/2019 Document Reviewed: 08/22/2019 Elsevier Patient Education  [place].

## 2022-03-31 NOTE — Progress Notes (Addendum)
[facility]    Provider:  Dr Lucia Gaskins Requesting Provider: Dr. Posey [facility] Primary Care Provider:  Joaquim Nam, MD  CC:  leg pain, back pain  Addendum 12/21/2022: patient's symptoms are worsening. There was a question of CNS lymphoma In past MRIs. Need to surveille the brain. Will order repeat.  03/31/2022: He say an orthopaedist and was recommended knee replacement which can be causing the radiating pain in the legs. Now worsening low back pain (points to the SI joint pain), no radiculopathy, no new vision symptoms or other new symptoms related to the past MRI brain nothing else new. He has also had gerd for years, or at least he has been told gerd, been to pulmonary, ENT, PCP, he feel phlegm in the throat, he can cough it up pretty clear, may be sinuses, no burning in the sternum. He has discomfort in the throat. Never had an upper GI scope especially if it is gerd may have barrett's esophagus? Out of my area, see assessment/plan for specifics.  Patient complains of symptoms per HPI as well as the following symptoms: leg and back pain, coughing . Pertinent negatives and positives per HPI. All others negative   12/24/2021: 87 y.o. 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  He is a referral from Dr. Dione Booze.  Dr. Dione Booze seeing him for skew deviation.  There was an occipital stroke noted.  However also in the MRI showed yet impression read that impression right increased signal intensity on the diffusion weighted imaging in the posterior horn of the right lateral ventricle, some of these areas demonstrate peripheral curvilinear enhancement, this is nonspecific and differential diagnosis includes primary CNS lymphoma versus secondary lymphoma versus metastatic disease versus postinfectious process versus less likely inflammatory process such as sarcoid, demyelinating disease or sarcoidosis.  Extensive workup (see below) was negative and repeat  MRI of the brain did not show the concerning findings anymore. I discussed given repeat MRI without findings, patient improvement, and extensive workup including panscan, LP, blood work showing no abnormalities, we can just monitor. I review prior MRI images and new MRI images with patient.   He wants to discuss new problem: A few months ago, he has had aching legs and thighs, not ocnstant but off and on during the night and down into his calfs. He may have some ankle swelling. Ankle pain ongoing for 20+ years, he feels some weakness in the legs and progressive low back pain, has had multiple surgeries with Dr. Danielle Dess. If he sits a while and he gets up it is difficult to get up and it hurts in the knees and lower back. He needs knee replacements, symptoms are worse in the right than the left leg. Almost every night they ache, a knee brace helps with the aching and so do compression socks they come above the knees. He has swelling, feels tight like a tourniquet, has had issues for 20+ years, right > left leg discomfort, arms are fine, if he gets on the floor he has to roll over and get up, sometimes hurts to get up(points to the knees and above the knees), more difficult to walk uphill and stairs. Aspirin helps, tylenol does not.   Patient complains of symptoms per HPI as well as the following symptoms: leg pain . Pertinent negatives and positives per HPI. All others negative   August 16, 2021: I am speaking with patient and his wife over the phone to follow-up  on finding.  Patient was seen by Dr. Dione Booze for diplopia, MRI of the brain showed an occipital stroke but also right increased signal intensity in the diffusion weighted imaging in the posterior horn of the right lateral ventricle some of these demonstrating peripheral curvilinear enhancement, nonspecific and differential diagnosis includes primary CNS lymphoma versus secondary lymphoma versus metastatic disease versus a postinfectious process or  inflammatory process, demyelinating disease or sarcoidosis.  We reviewed his work-up today which unremarkable:  CT of the abdomen and pelvis showed No evidence for a neoplastic or lymphoproliferative process in the chest, abdomen or pelvis LP showed normal glucose, protein, cell count, flow cytometry did not find enough cells to examine, protein, cell differential, HSV, sarcoid, fungal culture, CMV, EBV, VDRL, oligoclonal bands in CSF IgG index were all unremarkable Labs were all unremarkable including IgG and IgM CMV antibodies, sed rate, ANA with reflex, ACE, CRP, SPEP/IFE, ANCA all unremarkable  At this time I recommended we repeat MRI of the brain in 2 to 3 months.  Patient is stable.  No worsening.  If he worsens we will repeat the MRI sooner, he is to reach out to [place].  HPI:  HALLEY ELZA is a 87 y.o. 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  He is a referral from Dr. Dione Booze.  Dr. Dione Booze seeing him for skew deviation.  There was an occipital stroke noted.  However also in the MRI showed yet impression read that impression right increased signal intensity on the diffusion weighted imaging in the posterior horn of the right lateral ventricle, some of these areas demonstrate peripheral curvilinear enhancement, this is nonspecific and differential diagnosis includes primary CNS lymphoma versus secondary lymphoma versus metastatic disease versus postinfectious process versus less likely inflammatory process such as sarcoid, demyelinating disease or sarcoidosis.  Recommend lumbar puncture.  I spent a long time discussing this with patient and wife, wife appears to have Parkinson's disease and possibly Parkinson's disease dementia I did not get the feeling as she understood, I tried to explain to them what we do not know what this is a we need more testing, they could not understand why I did not know what it was, I tried to go through the entire differential with them.   At this time organ to do an extensive work-up.  Reviewed notes, labs and imaging from outside physicians, which showed: see above  Review of Systems: Patient complains of symptoms per HPI as well as the following symptoms diplopia. Pertinent negatives and positives per HPI. All others negative.   Social History   Socioeconomic History   Marital status: Married    Spouse name: [name]   Number of children: 3   Years of education: Not on file   Highest education level: Not on file  Occupational History   Occupation: Retired  Tobacco Use   Smoking status: Former    Packs/day: 1.00    Years: 25.00    Total pack years: 25.00    Types: Cigarettes    Quit date: 04/25/1980    Years since quitting: 41.9   Smokeless tobacco: Never  Vaping Use   Vaping Use: Never used  Substance and Sexual Activity   Alcohol use: No    Alcohol/week: 0.0 standard drinks of alcohol   Drug use: No   Sexual activity: Not on file  Other Topics Concern   Not on file  Social History Narrative   [employer]-'58, overseas to [place]   Some  care [facility]   No service disability   Social Determinants of Health   Financial Resource Strain: Low Risk  (10/22/2021)   Overall Financial Resource Strain (CARDIA)    Difficulty of Paying Living Expenses: Not hard at all  Food Insecurity: No Food Insecurity (10/22/2021)   Hunger Vital Sign    Worried About Running Out of Food in the Last Year: Never true    Ran Out of Food in the Last Year: Never true  Transportation Needs: No Transportation Needs (10/22/2021)   PRAPARE - Administrator, Civil Service (Medical): No    Lack of Transportation (Non-Medical): No  Physical Activity: Inactive (10/22/2021)   Exercise Vital Sign    Days of Exercise per Week: 0 days    Minutes of Exercise per Session: 0 min  Stress: No Stress Concern Present (10/22/2021)   [employer] of Occupational Health - Occupational Stress Questionnaire    Feeling of Stress : Not at  all  Social Connections: Not on file  Intimate Partner Violence: Not At Risk (10/19/2020)   Humiliation, Afraid, Rape, and Kick questionnaire    Fear of Current or Ex-Partner: No    Emotionally Abused: No    Physically Abused: No    Sexually Abused: No    Family History  Problem Relation Age of Onset   Cancer Mother    Stroke Neg Hx     Past Medical History:  Diagnosis Date   Anticoagulated on Coumadin    followed in CVRR at [facility]   Aortic stenosis    a. Echo 2/15: Severe LVH, EF 55-60%, Gr 1 DD, mild to mod AS (mean 20 mmHg, peak 31 mmHg), AVA 1.4-1.5 cm2, mild AI, MAC, trivial MR, LA upper limits of normal, RVSP 22 mmHg, mild RAE;   b. Echo 2/16:  EF 55-60%, no RWMA, mild AS (mean 17 mmHg, peak 27 mmHg), AVA 1.5 cm2, mild AI, MAC, mild LAE   Arthritis    Bladder neoplasm    Chronic cough    NO CARDIAC OR PULMONARY RELATED   Complication of anesthesia    Coronary artery disease CARDIOLOGIST-  DR KLEIN/ [facility]   a. s/p CABG 2004, b. LHC with patent grafts 07/2005, ejection fraction 50%;  c. 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 Amiodarone Rx   S/P CABG x 5 2004   Stroke [facility]) 03/10/2020   Stroke in the Left eye    Patient Active Problem List   Diagnosis Date Noted   Upper respiratory infection, acute 03/28/2022   Permanent atrial fibrillation (HCC) 02/22/2022   Chronic pain of both knees 12/25/2021   Abnormal CBC 09/28/2021   Bilateral leg pain 09/28/2021   Tick bite of abdominal wall 09/28/2021   Abnormal finding on MRI of brain 09/07/2021   Aorta disorder (HCC) 08/25/2021    Traumatic leg ulcer (HCC) 08/14/2020   Bilateral pseudophakia 07/27/2020   Carpal tunnel syndrome 07/27/2020   Dermatochalasis 07/27/2020   Headache 07/27/2020   Hematuria 07/27/2020   Insomnia 07/27/2020   Postsurgical aortocoronary  bypass status 07/27/2020   Pure hypercholesterolemia 07/27/2020   Radiculopathy 07/27/2020   Vitamin D deficiency 07/27/2020   Stenosis of internal carotid artery with cerebral infarction, right (HCC) 07/08/2020   Glaucoma (increased eye pressure) 06/19/2020   Stress reaction 06/19/2020   Imbalance 03/27/2020   Retinal artery occlusion 03/10/2020   Constipation 12/22/2019   Laryngopharyngeal reflux (LPR) 05/22/2018   Rhinitis sicca 05/22/2018   Sciatica 12/28/2017   Neuropathy 10/07/2017   Foot swelling 10/07/2017   CAD (coronary artery disease), autologous vein bypass graft 10/31/2016   Family history of colon cancer 10/31/2016   History of adenomatous polyp of colon 10/31/2016   Osteoarthritis 10/31/2016   Atrial fibrillation (HCC) 10/31/2016   Syncope 02/11/2016   High risk medication use    Skin lesion 06/04/2015   Elevated TSH 12/26/2014   Aortic stenosis 05/16/2013   Bladder neoplasm 11/23/2012   BCC (basal cell carcinoma of skin) 08/29/2012   Vertigo 08/24/2012   GERD (gastroesophageal reflux disease) 05/01/2012   Hyperlipidemia 03/14/2012   TMJ pain dysfunction syndrome 01/20/2012   Long term (current) use of anticoagulants 08/31/2011   RVOT ventricular tachycardia (HCC)    HEARING LOSS, SUDDEN 12/03/2007   Essential hypertension 02/22/2007   PAF (paroxysmal atrial fibrillation) (HCC) 02/22/2007    Past Surgical History:  Procedure Laterality Date   CARDIAC CATHETERIZATION  07-26-2005  DR GAMBLE   PRESERVED LVF/  EF 50%/ PATENT GRAFTS   CARDIAC CATHETERIZATION  03-04-2003  DR GAMBLE   SEVERE 3 VESSEL DISEASE   CARDIAC CATHETERIZATION  1991  DR [facility]   PAF/ FALSE POSITIVE STRESS TEST   CAROTID ENDARTERECTOMY Left 2022    CATARACT EXTRACTION W/ INTRAOCULAR LENS IMPLANT Right    CORONARY ARTERY BYPASS GRAFT  03-08-2003  DR [id number]   LIMA TO LAD/ SVG TO OM2/  SVG TO DIAGONAL / SVG TO PDA & PLA   CYSTOSCOPY WITH BIOPSY N/A 12/25/2012   Procedure: CYSTOSCOPY WITH BLADDER BIOPSY;  Surgeon: Antony Haste, MD;  Location: [facility];  Service: Urology;  Laterality: N/A;   ELECTROPHYSIOLOGY STUDY  07-27-2005  DR Sharlot Gowda TAYLOR   MAPPING --   RESULT NONINDUCIBLE VT OR SVT/  DX RIGHT VENTRICULAR OUTFLOW TRACT VT AND RULES OUT MORE MALIGNANT CAUSES OF VT   ENDARTERECTOMY Left 07/08/2020   Procedure: LEFT CAROTID ENDARTERECTOMY;  Surgeon: Nada Libman, MD;  Location: [facility] OR;  Service: Vascular;  Laterality: Left;   INGUINAL HERNIA REPAIR Bilateral 1954  &  1990   KNEE ARTHROSCOPY Right 1994   LUMBAR DISC SURGERY  1999   L4 -- L5   MOHS SURGERY     by Dr. Jeanella Craze 2019   REMOVAL VOCAL CORD POLYPS  1978   TONSILLECTOMY  AS CHILD   TRANSTHORACIC ECHOCARDIOGRAM  08/08/2011   MILD LVH/  EF 55-60%/  GRADE I DIASTOLIC DYSFUNCTION/ MILD AV STENOSIS    Current Outpatient Medications  Medication Sig Dispense Refill   acetaminophen (TYLENOL) 325 MG tablet Take 650 mg by mouth once as needed for moderate pain or headache.     amoxicillin (AMOXIL) 875 MG tablet Take 1 tablet (875 mg total) by mouth 2 (two) times daily for 10 days. 20 tablet 0   apixaban (ELIQUIS) 5 MG TABS tablet Take 1 tablet (5 mg total) by mouth 2 (two) times daily. Take evening dose starting 07/09/20 180 tablet 3   brimonidine (ALPHAGAN) 0.2 % ophthalmic solution Place into the left eye 2 (two) times daily.     butalbital-acetaminophen-caffeine (  FIORICET, ESGIC) 50-325-40 MG per tablet Take 1 tablet by mouth 3 (three) times daily as needed for migraine.      cetirizine (ZYRTEC) 10 MG tablet Take 1 tablet (10 mg total) by mouth daily.     diltiazem (CARDIZEM CD) 120 MG 24 hr capsule Take 1 capsule (120 mg total) by mouth 2 (two) times  daily. 180 capsule 3   fluticasone (FLONASE) 50 MCG/ACT nasal spray Place 1 spray into both nostrils daily. 16 g 1   hydrochlorothiazide (HYDRODIURIL) 25 MG tablet Take 1 tablet (25 mg total) by mouth daily. 90 tablet 2   latanoprost (XALATAN) 0.005 % ophthalmic solution INSTILL 1 DROP IN BOTH EYES AT BEDTIME -  CONTACT PHARMACY WHEN NEW SUPPLY IS NEEDED     melatonin 3 MG TABS tablet Take 3 mg by mouth at bedtime.     Multiple Vitamins-Minerals (MULTIVITAMIN WITH MINERALS) tablet Take 1 tablet by mouth daily.     Polyethyl Glycol-Propyl Glycol (SYSTANE ULTRA OP) Apply 1-2 drops to eye once as needed (dry eyes.).      potassium chloride (K-DUR) 10 MEQ tablet Take 10 mEq by mouth every morning. Take as directed     pravastatin (PRAVACHOL) 80 MG tablet Take 1 tablet (80 mg total) by mouth every evening. 90 tablet 0   No current facility-administered medications for this visit.    Allergies as of 03/31/2022 - Review Complete 03/31/2022  Allergen Reaction Noted   Ace inhibitors Other (See Comments) 12/17/2012   Zocor [simvastatin] Other (See Comments) 12/17/2012   Nsaids  09/17/2020   Prednisone Other (See Comments) 10/29/2020   Requip [ropinirole]  08/23/2021   Cephalexin Rash    Chocolate  03/10/2020   Doxycycline Other (See Comments) 04/30/2018   Gabapentin Other (See Comments) 12/17/2019   Hycodan [hydrocodone bit-homatrop mbr] Nausea And Vomiting 08/18/2011   Oxycodone Nausea Only 09/01/2015    Vitals: BP 132/82   Pulse 85   Ht 5\' 10"  (1.778 m)   Wt 193 lb 9.6 oz (87.8 kg)   BMI 27.78 kg/m  Last Weight:  Wt Readings from Last 1 Encounters:  03/31/22 193 lb 9.6 oz (87.8 kg)   Last Height:   Ht Readings from Last 1 Encounters:  03/31/22 5\' 10"  (1.778 m)   Exam: NAD, pleasant                  Speech:    Speech is normal; fluent and spontaneous with normal comprehension.  Cognition:    The patient is oriented to person, place, and time;     recent and remote memory  intact;     language fluent;    Cranial Nerves:    The pupils are equal, round, and reactive to light.Trigeminal sensation is intact and the muscles of mastication are normal. The face is symmetric. The palate elevates in the midline. Hearing intact. Voice is normal. Shoulder shrug is normal. The tongue has normal motion without fasciculations.   Coordination:  No dysmetria  Motor Observation:    No asymmetry, no atrophy, and no involuntary movements noted. Tone:    Normal muscle tone.     Strength:    Strength is V/V in the upper and lower limbs.      Sensation: intact to LT    Assessment/Plan: 87 y.o. 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  He is a referral from Dr. Dione Booze.  Dr. Dione Booze seeing him for skew deviation.  There was an occipital stroke noted.  However also in the MRI showed yet impression read that impression right increased signal intensity on the diffusion weighted imaging in the posterior horn of the right lateral ventricle, some of these areas demonstrate peripheral curvilinear enhancement, this is nonspecific and differential diagnosis includes primary CNS lymphoma versus secondary lymphoma versus metastatic disease versus postinfectious process versus less likely inflammatory process such as sarcoid, demyelinating disease or sarcoidosis.   Extensive workup (see below) was negative and repeat MRI of the brain did not show the concerning findings anymore. I discussed given repeat MRI without findings, patient improvement, and extensive workup including panscan, LP, blood work showing no abnormalities, we can just monitor. I reviewed prior MRI images and new MRI images with patient.   - leg pain in the setting of knee pain and needing total knee replacements per patient: His strength is normal, multiple CKs normal, pain is more distal (he rubs his knees and distal upper leg). Unlikely muscle disease, likely referred pain  from the knees. Will monitor clinically. Also multiple back surgeries. He saw an orthopaedist and was recommended knee replacement. Now worsening back pain (points to the SI joint pain), no radiculopathy. Offered SI joint injections, can go directly to Dr. Geanie Cooley or can see Dr. Danielle Dess first. He would like to see both.  After Visit Summary:   SI joint point tenderness om the right: Dr. Arley Phenix SI joint injections Low back pain, established with Dr. Danielle Dess had 2 surgeries: No radiculopathy, I think his back pain is in the SI joint but patient requests to see Dr. Danielle Dess as well  Discomfort in the throat: been to pulmonary, ENT(years ago dr Pollyann Kennedy and dr Suszanne Conners for vertigo), PCP, he feel phlegm in the base of the throat, he can cough it up pretty clear, no sinus drainage, no burning in the sternum, not worse at night. He has discomfort in the throat. Never had an upper GI scope - maybe he should especially if it is gerd may have barrett's esophagus? Out of my area of expertise, told him this, will refer back to Dr. Suszanne Conners. Worsening. Probably needs a scope at this point for visualization but will leave that up to ENT. Coughing because of it.   SI joint point tenderness om the right: Dr. Arley Phenix SI joint injections Low back pain, established with Dr. Danielle Dess had 2 surgeries: No radiculopathy, I think his back pain is in the SI joint but patient requests to see Dr. Danielle Dess as well Send to ENT Dr. Suszanne Conners   Sacroiliac joint arthritis   Sacroiliac Joint Dysfunction   Orders Placed This Encounter  Procedures   Ambulatory referral to Neurosurgery   Ambulatory referral to Pain Clinic   Ambulatory referral to ENT      - Patient was seen by Dr. Dione Booze for diplopia, MRI of the brain showed an occipital stroke but also right increased signal intensity in the diffusion weighted imaging in the posterior horn of the right lateral ventricle some of these demonstrating peripheral curvilinear enhancement,  nonspecific and differential diagnosis includes primary CNS lymphoma versus secondary lymphoma versus metastatic disease versus a postinfectious process or inflammatory process, demyelinating disease or sarcoidosis.  We reviewed his work-up again today which unremarkable: Extensive workup (see below) was negative and repeat MRI of the brain did not show the concerning findings anymore. I discussed given repeat MRI without findings, patient improvement, and extensive workup including panscan, LP, blood work showing no abnormalities, we can just monitor. I review  prior MRI images and new MRI images with patient.   CT of the abdomen and pelvis showed No evidence for a neoplastic or lymphoproliferative process in the chest, abdomen or pelvis LP showed normal glucose, protein, cell count, flow cytometry did not find enough cells to examine, protein, cell differential, HSV, sarcoid, fungal culture, CMV, EBV, VDRL, oligoclonal bands in CSF IgG index were all unremarkable Labs were all unremarkable including IgG and IgM CMV antibodies, sed rate, ANA with reflex, ACE, CRP, SPEP/IFE, ANCA all unremarkable  At this time I recommended we monitor clinically  Cc: Joaquim Nam, MD,  Dr. Danielle Dess, Dr Suszanne Conners  Naomie Dean, MD  [facility] [address] [place], [place] [address]  Phone [phone] Fax [phone] I spent 40 minutes of face-to-face and non-face-to-face time with patient on the  1. Chronic right-sided low back pain without sciatica   2. SI joint arthritis   3. Throat clearing   4. Throat irritation    diagnosis.  This included previsit chart review, lab review, study review, order entry, electronic health record documentation, patient education on the different diagnostic and therapeutic options, counseling and coordination of care, risks and benefits of management, compliance, or risk factor reduction

## 2022-04-01 ENCOUNTER — Telehealth: Payer: Self-pay | Admitting: Neurology

## 2022-04-01 NOTE — Telephone Encounter (Addendum)
Referral sent to Dr. Davy Pique & Dr. Ellene Route at [facility], phone # [phone].

## 2022-04-02 ENCOUNTER — Encounter: Payer: Self-pay | Admitting: Family

## 2022-04-04 NOTE — Telephone Encounter (Signed)
Spoke to patient by telephone and was advised that he was seen last week and given an antibiotic. Patient stated that he still has some medication left to take. Patient stated that he has a real bad cough especially at night. Patient stated that he blew his nose yesterday and blew out clots of blood. Patient stated that he has a sore throat. Patient denies SOB or difficulty breathing. Patient stated that he is not improving like he thinks that he should. Patient stated that he would like a recheck with Dr. Damita Dunnings. Patient scheduled for an appointment with Dr. Damita Dunnings tomorrow 04/05/22 at 2:30. Patient stated that his wife is not doing well and will see how she does today. Patient stated that if he is not able to make the appointment tomorrow he will call back and cancel the appointment. Advised patient this message will go back to Dr. Damita Dunnings for his review.

## 2022-04-05 ENCOUNTER — Ambulatory Visit (INDEPENDENT_AMBULATORY_CARE_PROVIDER_SITE_OTHER): Payer: Medicare Other | Admitting: Family Medicine

## 2022-04-05 ENCOUNTER — Encounter: Payer: Self-pay | Admitting: Family Medicine

## 2022-04-05 VITALS — BP 104/70 | HR 57 | Temp 97.3°F | Ht 70.0 in | Wt 194.0 lb

## 2022-04-05 DIAGNOSIS — R059 Cough, unspecified: Secondary | ICD-10-CM | POA: Diagnosis not present

## 2022-04-05 DIAGNOSIS — I639 Cerebral infarction, unspecified: Secondary | ICD-10-CM

## 2022-04-05 MED ORDER — PANTOPRAZOLE SODIUM 40 MG PO TBEC: 40.0000 mg | DELAYED_RELEASE_TABLET | Freq: Every day | ORAL | 3 refills | Status: DC

## 2022-04-05 MED ORDER — CHLORPHENIRAMINE-DM 4-30 MG PO TABS: 1.0000 | ORAL_TABLET | Freq: Three times a day (TID) | ORAL | 0 refills | Status: DC | PRN

## 2022-04-05 NOTE — Patient Instructions (Signed)
Try a neti pot, use coricidin DM as needed, and take protonix daily.  Take care.  Glad to see you.

## 2022-04-05 NOTE — Telephone Encounter (Signed)
Agreed, will see at [place].  Thanks.

## 2022-04-05 NOTE — Progress Notes (Unsigned)
Seen on 03/28/22 with cough, ST, hoarse.  Had been sick for about 5 days at that point.  On amoxil in the meantime. He felt some better, then levelled off and got back to status from 03/28/22.  Cough esp at night and early AM.  Tickling in the throat at night, triggering a cough.  Discussed possible GERD vs post nasal gtt causing the cough.  Mild ST in the meantime.  Rhinorrhea, bloody once.  Passed a clot at the time.  D/w pt about using neti pot at baseline.  Fatigued.    He had a cough at baseline, possibly from GERD, for years, even w/o illness.    Meds, vitals, and allergies reviewed.   ROS: Per HPI unless specifically indicated in ROS section   Mild SOM R TM.  No erythema.  L TM wnl.    Small irritated area R lateral nostril, possible prev bleeding site- no active bleed.   Sinuses not ttp Ctab IRR

## 2022-04-06 NOTE — Assessment & Plan Note (Signed)
Discussed ddx, post nasal gtt, recent infection, GERD.   Reasonable to try a neti pot, use coricidin DM as needed, and take protonix daily.  He will update me as needed.

## 2022-04-08 ENCOUNTER — Encounter: Payer: Self-pay | Admitting: Family Medicine

## 2022-04-14 ENCOUNTER — Encounter (INDEPENDENT_AMBULATORY_CARE_PROVIDER_SITE_OTHER): Payer: Medicare Other | Admitting: Ophthalmology

## 2022-04-14 DIAGNOSIS — H34832 Tributary (branch) retinal vein occlusion, left eye, with macular edema: Secondary | ICD-10-CM | POA: Diagnosis not present

## 2022-04-14 DIAGNOSIS — I10 Essential (primary) hypertension: Secondary | ICD-10-CM | POA: Diagnosis not present

## 2022-04-14 DIAGNOSIS — H35033 Hypertensive retinopathy, bilateral: Secondary | ICD-10-CM | POA: Diagnosis not present

## 2022-04-14 DIAGNOSIS — H34232 Retinal artery branch occlusion, left eye: Secondary | ICD-10-CM

## 2022-04-14 DIAGNOSIS — H43813 Vitreous degeneration, bilateral: Secondary | ICD-10-CM | POA: Diagnosis not present

## 2022-04-26 DIAGNOSIS — K625 Hemorrhage of anus and rectum: Secondary | ICD-10-CM | POA: Diagnosis not present

## 2022-04-28 ENCOUNTER — Encounter: Payer: Self-pay | Admitting: Family Medicine

## 2022-05-18 DIAGNOSIS — H16223 Keratoconjunctivitis sicca, not specified as Sjogren's, bilateral: Secondary | ICD-10-CM | POA: Diagnosis not present

## 2022-05-18 DIAGNOSIS — H01115 Allergic dermatitis of left lower eyelid: Secondary | ICD-10-CM | POA: Diagnosis not present

## 2022-05-20 ENCOUNTER — Telehealth: Payer: Medicare Other

## 2022-05-25 ENCOUNTER — Encounter (INDEPENDENT_AMBULATORY_CARE_PROVIDER_SITE_OTHER): Payer: Medicare Other | Admitting: Ophthalmology

## 2022-05-26 ENCOUNTER — Encounter (INDEPENDENT_AMBULATORY_CARE_PROVIDER_SITE_OTHER): Payer: Medicare Other | Admitting: Ophthalmology

## 2022-05-26 DIAGNOSIS — H43812 Vitreous degeneration, left eye: Secondary | ICD-10-CM | POA: Diagnosis not present

## 2022-05-26 DIAGNOSIS — H34832 Tributary (branch) retinal vein occlusion, left eye, with macular edema: Secondary | ICD-10-CM

## 2022-05-26 DIAGNOSIS — I10 Essential (primary) hypertension: Secondary | ICD-10-CM

## 2022-05-26 DIAGNOSIS — H35032 Hypertensive retinopathy, left eye: Secondary | ICD-10-CM

## 2022-06-06 ENCOUNTER — Telehealth: Payer: Self-pay

## 2022-06-06 NOTE — Progress Notes (Signed)
Care Management & Coordination Services Pharmacy Team  Reason for Encounter: Appointment Reminder  Contacted patient to confirm telephone appointment with Charlene Brooke , PharmD on 06/14/22  @9$ :32 Spoke with patient on 06/06/2022   Do you have any problems getting your medications? No  Patient reports most all medications come mail order from [place]  What is your top health concern you would like to discuss at your upcoming visit?  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  Have you seen any other providers since your last visit with PCP? Yes- gasteroenterology, neurology    Chart review:  Recent office visits:  04/05/22-Graham Duncan,MD(PCP)-cough, try a neti pot, use coricidin DM as needed, and take protonix daily.  03/28/22-Tabitha Dugal,FNP(fam med)-sore throat,start amoxicillin 875 mg twice daily for 10 days 01/19/22-Spencer Copland,MD(fam med)-back and leg pain =start medrol dosepak 8m.  Recent consult visits:  04/26/22-Courtney Ollis,PA(gastro)-f/u BRBPR,use anusol and fiber Qam, f/u as needed. 03/31/22-Antonia Ahern,MD(neuro)-leg pain,arthritis, referral to Dr.Elsner,referral to ENT. 02/23/22-Steven Klein,MD(cardio)-He remains on a statin LDL remains elevated at 85.  Would be inclined to switch him to a underfilling statin i.e. rosuvastatin , increase his Cardizem from 120--240 daily in divided doses; we will keep his hydrochlorothiazide at 25 mg. F/u 12 months 02/07/22-Michael Cooper,MD(cardio)-aortic stenosis, echo surveillance in 6 months.  01/07/22-Orthopedic- no data 12/24/21-Antonia Ahern,MD(neuro)-f/u leg pain, referral for MRI.   Hospital visits:  None in previous 6 months   Star Rating Drugs:  Medication:  Last Fill: Day Supply Pravastatin 866m [facility]   Care Gaps: Annual wellness visit in last year? Yes  LiCharlene BrookePharmD notified  VeAvel SensorCCOrovilleAssistant [phone]

## 2022-06-09 ENCOUNTER — Encounter: Payer: Medicare Other | Admitting: Pharmacist

## 2022-06-09 NOTE — Progress Notes (Unsigned)
Care Management & Coordination Services Pharmacy Note  06/09/2022 Name:  [name] MRN:  [id number] DOB:  1934/11/28  Summary: ***  Recommendations/Changes made from today's visit: ***  Follow up plan: -Health Concierge will call patient *** -Pharmacist follow up televisit scheduled for *** -Cardiology appt 10/31/22    Subjective: [name] [name] is an 88 y.o. year old male who is a primary patient of Tonia Ghent, MD.  The care coordination team was consulted for assistance with disease management and care coordination needs.    Engaged with patient by telephone for follow up visit.  Recent office visits: 04/05/22 [name] OV: cough - recent course of amoxil. Rx pantoprazole 40 and chlorpheniramine-DM. Try neti pot.   03/28/22 NP Tabitha Dugal OV: URI - rx amoxil. Negative covid.  01/19/22 Dr Copland OV: lumbar pain - rx gabapentin. Given medrol dosepak.  10/29/21 [name] OV: f/u - bruit noted at [facility] - check w/ cardiology. Change claritin to zyrtec. Muscle aches - stop pravastatin x 1 week and monitor.  Recent consult visits: 03/31/22 Dr Jaynee Eagles (Neurology): R sided back pain/sciatica. Completed extensive workup for diplopia, s/p occipital stroke and MRI findings concerning for lymphoma. Workup ultimately unremarkable. Referred to pain clinic, neurosurgery and ENT.  02/23/22 Dr Caryl Comes (Cardiology): f/u afib, syncope. Increase diltiazem to BID  02/07/22 Dr Burt Knack (cardiology): f/u aortic stenosis - mod/severe, currently asymptomatic. Dizziness 2/2 occiptal stroke.   12/24/21 Dr Jaynee Eagles (Neurology): muscle pain. CK wnl.  Hospital visits: None in previous 6 months   Objective:  Lab Results  Component Value Date   CREATININE 0.9 10/25/2021   BUN 12 06/13/2021   GFR 76.59 10/27/2020   GFRNONAA >60 06/13/2021   GFRAA 83 09/02/2019   NA 137 06/13/2021   K 4.1 06/13/2021   CALCIUM 9.1 06/13/2021   CO2 28 06/13/2021   GLUCOSE 102 (H) 06/13/2021    Lab Results   Component Value Date/Time   HGBA1C 5.9 10/27/2020 12:58 PM   HGBA1C 5.6 03/11/2020 03:41 AM   GFR 76.59 10/27/2020 12:58 PM   GFR 73.87 12/10/2019 08:56 AM   MICROALBUR 0.517 10/25/2021 12:00 AM    Last diabetic Eye exam:  Lab Results  Component Value Date/Time   HMDIABEYEEXA No Retinopathy 07/07/2021 12:00 AM    Last diabetic Foot exam: No results found for: "HMDIABFOOTEX"   Lab Results  Component Value Date   CHOL 170 10/25/2021   HDL 70 10/25/2021   LDLCALC 85 10/25/2021   LDLDIRECT 88 07/27/2020   TRIG 74 10/25/2021   CHOLHDL 2 11/23/2020       Latest Ref Rng & Units 10/25/2021   12:00 AM 07/27/2021    2:35 PM 06/13/2021    3:03 PM  Hepatic Function  Total Protein 6.5 - 8.1 g/dL   7.2   Albumin 3.6 - 5.1 g/dL  4.8  4.1   AST 14 - 40 21      37   ALT 10 - 40 U/L 16      11   Alk Phosphatase 25 - 125 80      65   Total Bilirubin 0.3 - 1.2 mg/dL   1.0      This result is from an external source.    Lab Results  Component Value Date/Time   TSH 3.88 08/05/2021 10:03 AM   TSH 1.76 10/27/2020 12:58 PM       Latest Ref Rng & Units 10/25/2021   12:00 AM 06/13/2021    3:03 PM  10/27/2020   12:58 PM  CBC  WBC  6.0     5.7  5.2   Hemoglobin 13.5 - 17.5 14.2     13.8  13.5   Hematocrit 39.0 - 52.0 %  40.4  39.7   Platelets 150 - 400 K/uL 191     216  213.0      This result is from an external source.    Lab Results  Component Value Date/Time   VITAMINB12 299 08/05/2021 10:03 AM   VITAMINB12 359 10/06/2017 12:18 PM    Clinical ASCVD: Yes  The ASCVD Risk score (Arnett DK, et al., 2019) failed to calculate for the following reasons:   The 2019 ASCVD risk score is only valid for ages 63 to 75   The patient has a prior MI or stroke diagnosis    CHA2DS2/VAS Stroke Risk Points  6   0 Has Congestive Heart Failure:  No   1 Has Vascular Disease:  Yes   1 Has Hypertension:  Yes   2 Age:  87   0 Has Diabetes:  No   2 Had Stroke:  Yes  Had TIA:  No  Had  Thromboembolism:  No   0 Male:  No        10/22/2021   10:48 AM 10/19/2020   10:38 AM 03/17/2020   10:46 AM  Depression screen PHQ 2/9  Decreased Interest 0 0 0  Down, Depressed, Hopeless 0 0 0  PHQ - 2 Score 0 0 0  Altered sleeping  0   Tired, decreased energy  0   Change in appetite  0   Feeling bad or failure about yourself   0   Trouble concentrating  0   Moving slowly or fidgety/restless  0   Suicidal thoughts  0   PHQ-9 Score  0   Difficult doing work/chores  Not difficult at all      Social History   Tobacco Use  Smoking Status Former   Packs/day: 1.00   Years: 25.00   Total pack years: 25.00   Types: Cigarettes   Quit date: 04/25/1980   Years since quitting: 42.1  Smokeless Tobacco Never   BP Readings from Last 3 Encounters:  04/05/22 104/70  03/31/22 132/82  03/28/22 (!) 142/80   Pulse Readings from Last 3 Encounters:  04/05/22 (!) 57  03/31/22 85  03/28/22 97   Wt Readings from Last 3 Encounters:  04/05/22 194 lb (88 kg)  03/31/22 193 lb 9.6 oz (87.8 kg)  03/28/22 193 lb 4 oz (87.7 kg)   BMI Readings from Last 3 Encounters:  04/05/22 27.84 kg/m  03/31/22 27.78 kg/m  03/28/22 27.73 kg/m    Allergies  Allergen Reactions   Ace Inhibitors Other (See Comments)    COUGH   Zocor [Simvastatin] Other (See Comments)    MYALGIA   Nsaids     Would avoid given anticoagulation   Prednisone Other (See Comments)    Prednisone caused pt to have elevated BP.   Requip [Ropinirole]     didn't sleep well and had trouble with abnormal dreams   Cephalexin Rash   Chocolate     Headaches    Doxycycline Other (See Comments)    Nausea and abd pain   Gabapentin Other (See Comments)    Dizziness   Hycodan [Hydrocodone Bit-Homatrop Mbr] Nausea And Vomiting   Oxycodone Nausea Only    Pt states that he cannot take prescription pain medication    Medications Reviewed  Today     Reviewed by Tonia Ghent, MD (Physician) on 04/05/22 at 1446  Med List  Status: <None>   Medication Order Taking? Sig Documenting Provider Last Dose Status Informant  acetaminophen (TYLENOL) 325 MG tablet [id number] Yes Take 650 mg by mouth once as needed for moderate pain or headache. [provider] Taking Active Self  amoxicillin (AMOXIL) 875 MG tablet [id number] Yes Take 1 tablet (875 mg total) by mouth 2 (two) times daily for 10 days. Eugenia Pancoast, FNP Taking Active   apixaban (ELIQUIS) 5 MG TABS tablet [id number] Yes Take 1 tablet (5 mg total) by mouth 2 (two) times daily. Take evening dose starting 07/09/20 Karoline Caldwell, PA-C Taking Active   brimonidine (ALPHAGAN) 0.2 % ophthalmic solution [id number] Yes Place into the left eye 2 (two) times daily. [provider] Taking Active   butalbital-acetaminophen-caffeine (FIORICET, ESGIC) [id number] MG per tablet [id number] Yes Take 1 tablet by mouth 3 (three) times daily as needed for migraine.  [provider] Taking Active Self           Med Note Carrolyn Meiers Mar 17, 2020 11:06 AM)    cetirizine (ZYRTEC) 10 MG tablet [id number] Yes Take 1 tablet (10 mg total) by mouth daily. Tonia Ghent, MD Taking Active   diltiazem (CARDIZEM CD) 120 MG 24 hr capsule [id number] Yes Take 1 capsule (120 mg total) by mouth 2 (two) times daily. Deboraha Sprang, MD Taking Active   fluticasone [facility]) 50 MCG/ACT nasal spray [id number] Yes Place 1 spray into both nostrils daily. Tonia Ghent, MD Taking Active   hydrochlorothiazide (HYDRODIURIL) 25 MG tablet [id number] Yes Take 1 tablet (25 mg total) by mouth daily. Deboraha Sprang, MD Taking Active   latanoprost (XALATAN) 0.005 % ophthalmic solution [id number] Yes INSTILL 1 DROP IN BOTH EYES AT BEDTIME -  CONTACT PHARMACY WHEN NEW SUPPLY IS NEEDED [provider] Taking Active   melatonin 3 MG TABS tablet [id number] Yes Take 3 mg by mouth at bedtime. [provider] Taking Active            Med Note Damita Dunnings, [place]veria Rising   Fri Oct 29, 2021   9:43 AM)    Multiple Vitamins-Minerals (MULTIVITAMIN WITH MINERALS) tablet [id number] Yes Take 1 tablet by mouth daily. [provider] Taking Active Self  Polyethyl Glycol-Propyl Glycol (SYSTANE ULTRA OP) [id number] Yes Apply 1-2 drops to eye once as needed (dry eyes.).  [provider] Taking Active Self  potassium chloride (K-DUR) 10 MEQ tablet [id number] Yes Take 10 mEq by mouth every morning. Take as directed Deboraha Sprang, MD Taking Active Self           Med Note Carrolyn Meiers Mar 17, 2020 11:07 AM)    pravastatin (PRAVACHOL) 80 MG tablet [id number] Yes Take 1 tablet (80 mg total) by mouth every evening. Deboraha Sprang, MD Taking Active             SDOH:  (Social Determinants of Health) assessments and interventions performed: {yes/no:20286} SDOH Interventions    Flowsheet Row Chronic Care Management from 05/20/2021 in [place] at [place] from 10/19/2020 in [place] at [place] from 08/22/2019 in [place] at [place]  SDOH Interventions     Food Insecurity Interventions Intervention Not Indicated -- --  Depression Interventions/Treatment  -- [id number] Score <4 Follow-up Not Indicated PHQ2-9 Score <4  Follow-up Not Indicated  Financial Strain Interventions Intervention Not Indicated -- --       Medication Assistance: {MEDASSISTANCEINFO:25044}  Medication Access: Within the past 30 days, how often has patient missed a dose of medication? *** Is a pillbox or other method used to improve adherence? {YES/NO:21197} Factors that may affect medication adherence? {CHL DESC; BARRIERS:21522} Are meds synced by current pharmacy? {YES/NO:21197} Are meds delivered by current pharmacy? {YES/NO:21197} Does patient experience delays in picking up medications due to transportation concerns? {YES/NO:21197}  [facility] Reviewed: Is patient disadvantaged to  use [facility]?: {YES/NO:21197} Current Rx insurance plan: *** Name and location of Current pharmacy:  [facility], [place] [address] Phone: [phone] Fax: [place], [place] - [address] [place] [address] Phone: [phone] Fax: [phone]  [facility] services reviewed with patient today?: {YES/NO:21197} Patient requests to transfer care to [facility]?: {YES/NO:21197} Reason patient declined to change pharmacies: {US patient preference:27474}  Compliance/Adherence/Medication fill history: Care Gaps: None  Star-Rating Drugs: Pravastatin - PDC unknown ([facility])   Assessment/Plan  Hypertension (BP goal <140/90) -Controlled - BP at home is at goal per pt report; he reports white coat syndrome responsible for elevated office readings -Current home BP readings: *** -Current treatment: Diltiazem CD 120 mg daily - Appropriate, Effective, Safe, Accessible HCTZ 25 mg daily -Appropriate, Effective, Safe, Accessible -Medications previously tried: ace inhibitors (cough)  -Denies hypotensive/hypertensive symptoms -Educated on BP goals and benefits of medications for prevention of heart attack, stroke and kidney damage; -Counseled to monitor BP at home periodically, document, and provide log at future appointments -Recommended to continue current medication  Hyperlipidemia: (LDL goal < 70) -{US controlled/uncontrolled:25276}LDL 85 (10/2021)  -Hx CAD s/p CABG; retinal artery occlusion; cerebral infarction -Current treatment: Pravastatin 80 mg daily 7pm -Appropriate, Effective, Safe, Accessible -Medications previously tried: simvastatin, rosuvastatin TIW  -Educated on Cholesterol goals; Benefits of statin for ASCVD risk reduction; -Recommended to continue current medication  Atrial  Fibrillation (Goal: prevent stroke and major bleeding) -Controlled - pt endorses compliance with Eliquis; he denies bleeding issues; he avoids NSAIDs and aspirin -CHADSVASC: 6 -Current treatment: Diltiazem CD 120 mg BID -Appropriate, Effective, Safe, Accessible Eliquis 5 mg BID -Appropriate, Effective, Safe, Accessible -Medications previously tried: warfarin -Counseled on increased risk of stroke due to Afib and benefits of anticoagulation for stroke prevention; importance of adherence to anticoagulant exactly as prescribed; bleeding risk associated with Eliquis and importance of self-monitoring for signs/symptoms of bleeding; avoidance of NSAIDs due to increased bleeding risk with anticoagulants; -Recommended to continue current medication  Glaucoma (Goal: maintain IOP) -Controlled -Retinal artery occlusion 06/2020. Elevated IOP shortly after, taking Xalatan to "prevent glaucoma" -Current treatment  Latanoprost 0.005% eye drops -Appropriate, Effective, Safe, Accessible Systane eye drops -Recommended to continue current medication  Headaches (Goal: manage symptoms) -Controlled - pt reports chocolate triggers headaches. He has HA 4-5 times a week usually caused by food -Current treatment  Butalbital-APAP-caffeine PRN -Appropriate, Effective, Safe, Accessible Tylenol 325 mg - Appropriate, Effective, Safe, Accessible -discussed avoidance of triggers -Recommended to continue current medication  Allergic rhinitis / chronic cough (Goal: manage symptoms) -Not ideally controlled - pt reports he has had chronic cough for years, seen by multiple doctors and ENT, nothing has helped -Current treatment  Fluticasone nasal spray - Appropriate, Query Effective, Safe, Accessible Loratadine 10 mg - Appropriate, Query Effective, Safe, Accessible -Discussed using Flonase every  day for a few weeks to see if more consistent use helps post-nasal drip; -Advised to switch antihistamine, he may have built  tolerance to loratadine. Recommend generic Allegra, Zyrtec or Xyzal  Health Maintenance -Vaccine gaps: covid booster -Hx bladder neoplasm 2014 -Recent cough - trial Pantoprazole   Charlene Brooke, PharmD, BCACP Clinical Pharmacist [facility] at [facility] [phone]

## 2022-06-14 ENCOUNTER — Encounter: Payer: Medicare Other | Admitting: Pharmacist

## 2022-06-14 ENCOUNTER — Telehealth: Payer: Self-pay | Admitting: Pharmacist

## 2022-06-14 ENCOUNTER — Telehealth: Payer: Medicare Other

## 2022-06-14 NOTE — Telephone Encounter (Signed)
Care Management & Coordination Services Outreach Note  06/14/2022 Name: [name] [name] MRN: [id number] DOB: [birth year]  Referred by: Tonia Ghent, MD  Patient had a phone appointment scheduled with clinical pharmacist today.  An unsuccessful telephone outreach was attempted today. The patient was referred to the pharmacist for assistance with medications, care management and care coordination.   Patient will NOT be penalized in any way for missing a Care Management & Coordination Services appointment. The no-show fee does not apply.  If possible, a message was left to return call to: [phone] or to [facility].  Charlene Brooke, PharmD, BCACP Clinical Pharmacist [facility] at [facility] [phone]

## 2022-06-28 ENCOUNTER — Other Ambulatory Visit (HOSPITAL_COMMUNITY): Payer: Medicare Other

## 2022-07-01 ENCOUNTER — Ambulatory Visit: Payer: Medicare Other | Admitting: Cardiovascular Disease

## 2022-07-27 ENCOUNTER — Ambulatory Visit (HOSPITAL_COMMUNITY): Payer: Medicare Other

## 2022-08-08 ENCOUNTER — Encounter: Payer: Self-pay | Admitting: Family Medicine

## 2022-08-08 ENCOUNTER — Telehealth: Payer: Self-pay | Admitting: Family Medicine

## 2022-08-08 DIAGNOSIS — Z66 Do not resuscitate: Secondary | ICD-10-CM | POA: Insufficient documentation

## 2022-08-08 DIAGNOSIS — I779 Disorder of arteries and arterioles, unspecified: Secondary | ICD-10-CM

## 2022-08-08 NOTE — Telephone Encounter (Signed)
Patient is due for f/u CT of chest- h/o ascending thoracic aorta dilation measuring 4.2cm.  Would need BMET prior to getting imaging done.  I pended the orders below.  Please contact patient, schedule lab visit, and I'll sign the orders.  Thanks.

## 2022-08-09 ENCOUNTER — Encounter: Payer: Self-pay | Admitting: Family Medicine

## 2022-08-09 NOTE — Telephone Encounter (Signed)
Unable to reach patient. Left voicemail to return call to our office.   

## 2022-08-09 NOTE — Telephone Encounter (Signed)
Called patient and scheduled him for labs on 08/11/22 at 11:00 am. Patient states he has his echocardiogram scheduled in June and cardiology appt scheduled for July pertaining to the ascending aorta. Is this still necessary to do with those upcoming this summer?

## 2022-08-09 NOTE — Telephone Encounter (Signed)
Pt called returning kelli's call. Pt requested a call back around 1:30-2pm. Call back # [phone]

## 2022-08-10 NOTE — Telephone Encounter (Signed)
See previous phone note.  I talked to patient about this.  This has been addressed.

## 2022-08-10 NOTE — Telephone Encounter (Signed)
Appt cancelled

## 2022-08-10 NOTE — Telephone Encounter (Signed)
I called pt.  Cards is going to follow this so he doesn't need the labs and CT.  I will defer to cardiology.  I apologize for the confusion and I appreciate cards help.  Pt agreed with plan.    Please cancel the lab visit for tomorrow.  Patient is aware not to come in.

## 2022-08-11 ENCOUNTER — Other Ambulatory Visit: Payer: Medicare Other

## 2022-08-12 ENCOUNTER — Other Ambulatory Visit: Payer: Self-pay | Admitting: Internal Medicine

## 2022-08-19 DIAGNOSIS — M1711 Unilateral primary osteoarthritis, right knee: Secondary | ICD-10-CM | POA: Diagnosis not present

## 2022-08-22 ENCOUNTER — Encounter: Payer: Self-pay | Admitting: Family Medicine

## 2022-08-22 NOTE — Progress Notes (Signed)
noted 

## 2022-08-22 NOTE — Telephone Encounter (Signed)
Sent in error, disregard message. Thank you!

## 2022-08-22 NOTE — Telephone Encounter (Signed)
Patient called in and stated that he thinks his [employer] may need to be done over. He stated that his Corlanor is coming in at retail price instead of $0 for the last 2 months. He was trying to check on that. Please advise. Thank you!

## 2022-08-25 ENCOUNTER — Encounter (INDEPENDENT_AMBULATORY_CARE_PROVIDER_SITE_OTHER): Payer: Medicare Other | Admitting: Ophthalmology

## 2022-08-26 ENCOUNTER — Encounter (INDEPENDENT_AMBULATORY_CARE_PROVIDER_SITE_OTHER): Payer: Medicare Other | Admitting: Ophthalmology

## 2022-08-26 DIAGNOSIS — H43813 Vitreous degeneration, bilateral: Secondary | ICD-10-CM

## 2022-08-26 DIAGNOSIS — I10 Essential (primary) hypertension: Secondary | ICD-10-CM

## 2022-08-26 DIAGNOSIS — H35033 Hypertensive retinopathy, bilateral: Secondary | ICD-10-CM

## 2022-08-26 DIAGNOSIS — H34832 Tributary (branch) retinal vein occlusion, left eye, with macular edema: Secondary | ICD-10-CM

## 2022-09-07 DIAGNOSIS — M25562 Pain in left knee: Secondary | ICD-10-CM | POA: Diagnosis not present

## 2022-09-09 DIAGNOSIS — H401112 Primary open-angle glaucoma, right eye, moderate stage: Secondary | ICD-10-CM | POA: Diagnosis not present

## 2022-09-09 DIAGNOSIS — H532 Diplopia: Secondary | ICD-10-CM | POA: Diagnosis not present

## 2022-09-09 DIAGNOSIS — H3562 Retinal hemorrhage, left eye: Secondary | ICD-10-CM | POA: Diagnosis not present

## 2022-09-09 DIAGNOSIS — H5319 Other subjective visual disturbances: Secondary | ICD-10-CM | POA: Diagnosis not present

## 2022-09-09 DIAGNOSIS — H34212 Partial retinal artery occlusion, left eye: Secondary | ICD-10-CM | POA: Diagnosis not present

## 2022-09-09 DIAGNOSIS — H35373 Puckering of macula, bilateral: Secondary | ICD-10-CM | POA: Diagnosis not present

## 2022-09-09 DIAGNOSIS — H43813 Vitreous degeneration, bilateral: Secondary | ICD-10-CM | POA: Diagnosis not present

## 2022-09-09 DIAGNOSIS — H0102B Squamous blepharitis left eye, upper and lower eyelids: Secondary | ICD-10-CM | POA: Diagnosis not present

## 2022-09-09 DIAGNOSIS — H0102A Squamous blepharitis right eye, upper and lower eyelids: Secondary | ICD-10-CM | POA: Diagnosis not present

## 2022-09-09 DIAGNOSIS — H40052 Ocular hypertension, left eye: Secondary | ICD-10-CM | POA: Diagnosis not present

## 2022-09-09 DIAGNOSIS — Z961 Presence of intraocular lens: Secondary | ICD-10-CM | POA: Diagnosis not present

## 2022-09-23 ENCOUNTER — Encounter: Payer: Self-pay | Admitting: Family Medicine

## 2022-09-26 ENCOUNTER — Ambulatory Visit (HOSPITAL_COMMUNITY): Payer: Medicare Other | Attending: Cardiovascular Disease

## 2022-09-26 DIAGNOSIS — I350 Nonrheumatic aortic (valve) stenosis: Secondary | ICD-10-CM | POA: Insufficient documentation

## 2022-09-26 LAB — ECHOCARDIOGRAM COMPLETE
AR max vel: 0.5 cm2
AV Area VTI: 0.54 cm2
AV Area mean vel: 0.51 cm2
AV Mean grad: 44 mmHg
AV Peak grad: 75 mmHg
Ao pk vel: 4.33 m/s
Area-P 1/2: 4.29 cm2
MV M vel: 3.79 m/s
MV Peak grad: 57.5 mmHg
P 1/2 time: 591 msec
S' Lateral: 3.5 cm

## 2022-09-28 ENCOUNTER — Encounter: Payer: Self-pay | Admitting: Cardiovascular Disease

## 2022-09-28 ENCOUNTER — Other Ambulatory Visit: Payer: Self-pay | Admitting: Cardiology

## 2022-09-28 ENCOUNTER — Ambulatory Visit: Payer: Medicare Other | Attending: Cardiovascular Disease | Admitting: Cardiovascular Disease

## 2022-09-28 VITALS — BP 120/84 | HR 100 | Ht 70.0 in | Wt 179.2 lb

## 2022-09-28 DIAGNOSIS — I350 Nonrheumatic aortic (valve) stenosis: Secondary | ICD-10-CM

## 2022-09-28 DIAGNOSIS — I2510 Atherosclerotic heart disease of native coronary artery without angina pectoris: Secondary | ICD-10-CM | POA: Diagnosis not present

## 2022-09-28 DIAGNOSIS — I4821 Permanent atrial fibrillation: Secondary | ICD-10-CM | POA: Insufficient documentation

## 2022-09-28 NOTE — H&P (View-Only) (Signed)
Cardiology Office Note:    Date:  09/28/2022   ID:  [name] [name], DOB 09/25/1934, MRN [id number]  PCP:  Duncan, Graham S, MD    HeartCare Providers Cardiologist:  Steven Klein, MD     Referring MD: Duncan, Graham S, MD   Chief Complaint  Patient presents with   Aortic Stenosis    History of Present Illness:    [name] [name] is a 88 y.o. male with aortic stenosis, presenting for follow-up evaluation.  The patient was initially seen here in October 2023.  His history is pertinent for coronary artery disease with remote CABG, ventricular tachycardia, permanent atrial fibrillation, and stroke.  His primary complaint at the time of his initial evaluation was dizziness that he had experienced ever since his stroke.  He was felt to have moderate to severe aortic stenosis and he recently returned for a 6-month follow-up echocardiogram for close surveillance.  The patient is here alone today.  He was added on after his echocardiogram showed progressive aortic stenosis.  The patient actually had a syncopal episode a few days ago.  He has been having a lot of dizziness over the last week and this came on pretty abruptly.  His dizziness is almost constant.  He has had problems with dizziness ever since his stroke last year, but symptoms had improved until recently.  The patient did have an occipital stroke in 2023.  He has had a difficult time the last several months as his wife had a mechanical fall in January of this year.  She had fractured vertebrae and has had very limited mobility since then.  She has Parkinson's disease and dementia as well.  He has her sole caregiver.  He has to help her transfer and she is dependent on his care.  Since his fall 4 days ago, he has had some gait unsteadiness.  He denies chest pain, chest pressure, or shortness of breath with activity.  He denies orthopnea, PND, or heart palpitations.  He has been compliant with his oral anticoagulation.  He  denies headache.  Past Medical History:  Diagnosis Date   Anticoagulated on Coumadin    followed in CVRR at [facility]   Aortic stenosis    a. Echo 2/15: Severe LVH, EF 55-60%, Gr 1 DD, mild to mod AS (mean 20 mmHg, peak 31 mmHg), AVA 1.4-1.5 cm2, mild AI, MAC, trivial MR, LA upper limits of normal, RVSP 22 mmHg, mild RAE;   b. Echo 2/16:  EF 55-60%, no RWMA, mild AS (mean 17 mmHg, peak 27 mmHg), AVA 1.5 cm2, mild AI, MAC, mild LAE   Arthritis    Bladder neoplasm    Chronic cough    NO CARDIAC OR PULMONARY RELATED   Complication of anesthesia    Coronary artery disease CARDIOLOGIST-  DR KLEIN/ ALLRED   a. s/p CABG 2004, b. LHC with patent grafts 07/2005, ejection fraction 50%;  c. 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 Amiodarone Rx   S/P CABG x 5 2004   Stroke (HCC) 03/10/2020     Stroke in the Left eye    Past Surgical History:  Procedure Laterality Date   CARDIAC CATHETERIZATION  07-26-2005  DR GAMBLE   PRESERVED LVF/  EF 50%/ PATENT GRAFTS   CARDIAC CATHETERIZATION  03-04-2003  DR GAMBLE   SEVERE 3 VESSEL DISEASE   CARDIAC CATHETERIZATION  1991  DR WEINTRAUB   PAF/ FALSE POSITIVE STRESS TEST   CAROTID ENDARTERECTOMY Left 2022   CATARACT EXTRACTION W/ INTRAOCULAR LENS IMPLANT Right    CORONARY ARTERY BYPASS GRAFT  03-08-2003  DR GERHARDT   LIMA TO LAD/ SVG TO OM2/  SVG TO DIAGONAL / SVG TO PDA & PLA   CYSTOSCOPY WITH BIOPSY N/A 12/25/2012   Procedure: CYSTOSCOPY WITH BLADDER BIOPSY;  Surgeon: Matthew Ramsey Eskridge, MD;  Location: [facility];  Service: Urology;  Laterality: N/A;    ELECTROPHYSIOLOGY STUDY  07-27-2005  DR GREGG TAYLOR   MAPPING --   RESULT NONINDUCIBLE VT OR SVT/  DX RIGHT VENTRICULAR OUTFLOW TRACT VT AND RULES OUT MORE MALIGNANT CAUSES OF VT   ENDARTERECTOMY Left 07/08/2020   Procedure: LEFT CAROTID ENDARTERECTOMY;  Surgeon: Brabham, Vance W, MD;  Location: [facility];  Service: Vascular;  Laterality: Left;   INGUINAL HERNIA REPAIR Bilateral 1954  &  1990   KNEE ARTHROSCOPY Right 1994   LUMBAR DISC SURGERY  1999   L4 -- L5   MOHS SURGERY     by Dr. Pierce 2019   REMOVAL VOCAL CORD POLYPS  1978   TONSILLECTOMY  AS CHILD   TRANSTHORACIC ECHOCARDIOGRAM  08/08/2011   MILD LVH/  EF 55-60%/  GRADE I DIASTOLIC DYSFUNCTION/ MILD AV STENOSIS    Current Medications: Current Meds  Medication Sig   acetaminophen (TYLENOL) 325 MG tablet Take 650 mg by mouth once as needed for moderate pain or headache.   apixaban (ELIQUIS) 5 MG TABS tablet Take 1 tablet (5 mg total) by mouth 2 (two) times daily. Take evening dose starting 07/09/20   butalbital-acetaminophen-caffeine (FIORICET, ESGIC) 50-325-40 MG per tablet Take 1 tablet by mouth 3 (three) times daily as needed for migraine.    cetirizine (ZYRTEC) 10 MG tablet Take 1 tablet (10 mg total) by mouth daily.   diltiazem (CARDIZEM CD) 120 MG 24 hr capsule Take 1 capsule (120 mg total) by mouth 2 (two) times daily.   fluticasone (FLONASE) 50 MCG/ACT nasal spray Place 1 spray into both nostrils daily. (Patient taking differently: Place 1 spray into both nostrils as needed.)   latanoprost (XALATAN) 0.005 % ophthalmic solution INSTILL 1 DROP IN BOTH EYES AT BEDTIME -  CONTACT PHARMACY WHEN NEW SUPPLY IS NEEDED   melatonin 3 MG TABS tablet Take 3 mg by mouth at bedtime.   Multiple Vitamins-Minerals (MULTIVITAMIN WITH MINERALS) tablet Take 1 tablet by mouth daily.   Polyethyl Glycol-Propyl Glycol (SYSTANE ULTRA OP) Apply 1-2 drops to eye once as needed (dry eyes.).    pravastatin (PRAVACHOL) 80 MG tablet Take 1 tablet (80 mg  total) by mouth every evening.   [DISCONTINUED] brimonidine (ALPHAGAN) 0.2 % ophthalmic solution Place into the left eye 2 (two) times daily.   [DISCONTINUED] Chlorpheniramine-DM 4-30 MG TABS Take 1 tablet by mouth 3 (three) times daily as needed.   [DISCONTINUED] hydrochlorothiazide (HYDRODIURIL) 25 MG tablet Take 1 tablet by mouth daily.   [DISCONTINUED] pantoprazole (PROTONIX) 40 MG tablet Take 1 tablet (40 mg total) by mouth daily.   [DISCONTINUED] potassium chloride (K-DUR) 10 MEQ tablet Take 10 mEq by mouth every morning. Take as directed       Allergies:   Ace inhibitors, Zocor [simvastatin], Nsaids, Prednisone, Requip [ropinirole], Cephalexin, Chocolate, Doxycycline, Gabapentin, Hycodan [hydrocodone bit-homatrop mbr], and Oxycodone   Social History   Socioeconomic History   Marital status: Married    Spouse name: [name]   Number of children: 3   Years of education: Not on file   Highest education level: Not on file  Occupational History   Occupation: Retired  Tobacco Use   Smoking status: Former    Packs/day: 1.00    Years: 25.00    Additional pack years: 0.00    Total pack years: 25.00    Types: Cigarettes    Quit date: 04/25/1980    Years since quitting: 42.4   Smokeless tobacco: Never  Vaping Use   Vaping Use: Never used  Substance and Sexual Activity   Alcohol use: No    Alcohol/week: 0.0 standard drinks of alcohol   Drug use: No   Sexual activity: Not on file  Other Topics Concern   Not on file  Social History Narrative   [employer] '[employer]-'58, overseas to [place]   [facility]   No service disability   Social Determinants of Health   Financial Resource Strain: Low Risk  (10/22/2021)   Overall Financial Resource Strain (CARDIA)    Difficulty of Paying Living Expenses: Not hard at all  Food Insecurity: No Food Insecurity (10/22/2021)   Hunger Vital Sign    Worried About Running Out of Food in the Last Year: Never true    Ran Out of Food in the Last Year: Never  true  Transportation Needs: No Transportation Needs (10/22/2021)   PRAPARE - Transportation    Lack of Transportation (Medical): No    Lack of Transportation (Non-Medical): No  Physical Activity: Inactive (10/22/2021)   Exercise Vital Sign    Days of Exercise per Week: 0 days    Minutes of Exercise per Session: 0 min  Stress: No Stress Concern Present (10/22/2021)   [employer] - Occupational Stress Questionnaire    Feeling of Stress : Not at all  Social Connections: Not on file     Family History: The patient's family history includes Cancer in his mother. There is no history of Stroke.  ROS:   Please see the history of present illness.    All other systems reviewed and are negative.  EKGs/Labs/Other Studies Reviewed:    The following studies were reviewed today: Cardiac Studies & Procedures       ECHOCARDIOGRAM  ECHOCARDIOGRAM COMPLETE 09/26/2022  Narrative ECHOCARDIOGRAM REPORT    Patient Name:   [name] [name] Date of Exam: 09/26/2022 Medical Rec #:  [id number]           Height:       70.0 in Accession #:    [id number]          Weight:       194.0 lb Date of Birth:  08/08/1934           BSA:          2.060 m Patient Age:    88 years            BP:           155/92 mmHg Patient Gender: M                   HR:           84 bpm. Exam Location:  [address]  Procedure: 2D Echo, Cardiac   Doppler and Color Doppler  Indications:    I35.0 Aortic Stenosis  History:        Patient has prior history of Echocardiogram examinations, most recent 01/31/2022. CAD, Prior CABG, Stroke, Arrythmias:Ventricular Tachycardia and Atrial Fibrillation; Risk Factors:Dyslipidemia.  Sonographer:    Tammie Crouch RCS Referring Phys: [id number] Senai Kingsley  IMPRESSIONS   1. Left ventricular ejection fraction, by estimation, is 60 to 65%. The left ventricle has normal function. The left ventricle has no regional wall motion abnormalities. Left ventricular  diastolic parameters are indeterminate. 2. Right ventricular systolic function is normal. The right ventricular size is normal. There is normal pulmonary artery systolic pressure. 3. Left atrial size was moderately dilated. 4. The mitral valve is degenerative. Moderate mitral valve regurgitation. No evidence of mitral stenosis. 5. Tricuspid valve regurgitation is mild to moderate. 6. The aortic valve is calcified. There is moderate calcification of the aortic valve. There is moderate thickening of the aortic valve. Aortic valve regurgitation is moderate. Severe aortic valve stenosis. Aortic regurgitation PHT measures 591 msec. Aortic valve area, by VTI measures 0.54 cm. Aortic valve mean gradient measures 44.0 mmHg. Aortic valve Vmax measures 4.33 m/s. 7. There is mild dilatation of the ascending aorta, measuring 41 mm. 8. The inferior vena cava is normal in size with greater than 50% respiratory variability, suggesting right atrial pressure of 3 mmHg.  FINDINGS Left Ventricle: Left ventricular ejection fraction, by estimation, is 60 to 65%. The left ventricle has normal function. The left ventricle has no regional wall motion abnormalities. The left ventricular internal cavity size was normal in size. There is no left ventricular hypertrophy. Left ventricular diastolic function could not be evaluated due to mitral annular calcification (moderate or greater). Left ventricular diastolic parameters are indeterminate.  Right Ventricle: The right ventricular size is normal. No increase in right ventricular wall thickness. Right ventricular systolic function is normal. There is normal pulmonary artery systolic pressure. The tricuspid regurgitant velocity is 2.21 m/s, and with an assumed right atrial pressure of 3 mmHg, the estimated right ventricular systolic pressure is 22.5 mmHg.  Left Atrium: Left atrial size was moderately dilated.  Right Atrium: Right atrial size was normal in  size.  Pericardium: There is no evidence of pericardial effusion. Presence of epicardial fat layer.  Mitral Valve: The mitral valve is degenerative in appearance. Moderate mitral valve regurgitation. No evidence of mitral valve stenosis.  Tricuspid Valve: The tricuspid valve is normal in structure. Tricuspid valve regurgitation is mild to moderate. No evidence of tricuspid stenosis.  Aortic Valve: The aortic valve is calcified. There is moderate calcification of the aortic valve. There is moderate thickening of the aortic valve. There is moderate aortic valve annular calcification. Aortic valve regurgitation is moderate. Aortic regurgitation PHT measures 591 msec. Severe aortic stenosis is present. Aortic valve mean gradient measures 44.0 mmHg. Aortic valve peak gradient measures 75.0 mmHg. Aortic valve area, by VTI measures 0.54 cm.  Pulmonic Valve: The pulmonic valve was normal in structure. Pulmonic valve regurgitation is mild. No evidence of pulmonic stenosis.  Aorta: There is mild dilatation of the ascending aorta, measuring 41 mm.  Venous: The inferior vena cava is normal in size with greater than 50% respiratory variability, suggesting right atrial pressure of 3 mmHg.  IAS/Shunts: No atrial level shunt detected by color flow Doppler.   LEFT VENTRICLE PLAX 2D LVIDd:         5.20 cm   Diastology LVIDs:         3.50 cm     LV e' medial:    7.07 cm/s LV PW:         0.90 cm   LV E/e' medial:  15.3 LV IVS:        1.30 cm   LV e' lateral:   16.10 cm/s LVOT diam:     2.00 cm   LV E/e' lateral: 6.7 LV SV:         48 LV SV Index:   23 LVOT Area:     3.14 cm   RIGHT VENTRICLE RV Basal diam:  3.90 cm RV S prime:     12.20 cm/s TAPSE (M-mode): 1.3 cm  LEFT ATRIUM              Index        RIGHT ATRIUM           Index LA diam:        5.00 cm  2.43 cm/m   RA Area:     19.90 cm LA Vol (A2C):   109.0 ml 52.90 ml/m  RA Volume:   56.60 ml  27.47 ml/m LA Vol (A4C):   74.7 ml  36.25  ml/m LA Biplane Vol: 89.2 ml  43.29 ml/m AORTIC VALVE AV Area (Vmax):    0.50 cm AV Area (Vmean):   0.51 cm AV Area (VTI):     0.54 cm AV Vmax:           433.00 cm/s AV Vmean:          311.000 cm/s AV VTI:            0.893 m AV Peak Grad:      75.0 mmHg AV Mean Grad:      44.0 mmHg LVOT Vmax:         68.70 cm/s LVOT Vmean:        50.800 cm/s LVOT VTI:          0.153 m LVOT/AV VTI ratio: 0.17 AI PHT:            591 msec  AORTA Ao Root diam: 3.20 cm Ao Asc diam:  4.10 cm  MITRAL VALVE                TRICUSPID VALVE MV Area (PHT):              TR Peak grad:   19.5 mmHg MV Decel Time:              TR Vmax:        221.00 cm/s MR Peak grad: 57.5 mmHg MR Mean grad: 46.0 mmHg     SHUNTS MR Vmax:      379.00 cm/s   Systemic VTI:  0.15 m MR Vmean:     335.0 cm/s    Systemic Diam: 2.00 cm MV E velocity: 108.50 cm/s  Kardie Tobb DO Electronically signed by Kardie Tobb DO Signature Date/Time: 09/26/2022/12:18:54 PM    Final              EKG:  EKG is ordered today.  The ekg ordered today demonstrates atrial fibrillation, right bundle branch block, minimal voltage criteria for LVH may be normal variant, possible inferior infarct age undetermined.  Recent Labs: 10/25/2021: ALT 16; Creatinine 0.9; Hemoglobin 14.2; Platelets 191 02/07/2022: NT-Pro BNP 667  Recent Lipid Panel    Component Value Date/Time   CHOL 170 10/25/2021 0000   CHOL 158 12/20/2017 0000   TRIG 74 10/25/2021 0000   TRIG 95 12/20/2017 0000     HDL 70 10/25/2021 0000   CHOLHDL 2 11/23/2020 1222   VLDL 13.2 11/23/2020 1222   LDLCALC 85 10/25/2021 0000   LDLCALC 74 12/20/2017 0000   LDLDIRECT 88 07/27/2020 1144     Risk Assessment/Calculations:    CHA2DS2-VASc Score = 6   This indicates a 9.7% annual risk of stroke. The patient's score is based upon: CHF History: 0 HTN History: 1 Diabetes History: 0 Stroke History: 2 Vascular Disease History: 1 Age Score: 2 Gender Score: 0                Physical Exam:    VS:  BP 120/84   Pulse 100   Ht 5' 10" (1.778 m)   Wt 179 lb 3.2 oz (81.3 kg)   SpO2 94%   BMI 25.71 kg/m     Wt Readings from Last 3 Encounters:  09/28/22 179 lb 3.2 oz (81.3 kg)  04/05/22 194 lb (88 kg)  03/31/22 193 lb 9.6 oz (87.8 kg)     GEN:  Well nourished, well developed in no acute distress HEENT: Normal NECK: No JVD; No carotid bruits LYMPHATICS: No lymphadenopathy CARDIAC: irregularly irregular, somewhat distant heart sounds, 3/6 harsh crescendo decrescendo murmur at the right upper sternal border with absent A2 RESPIRATORY:  Clear to auscultation without rales, wheezing or rhonchi  ABDOMEN: Soft, non-tender, non-distended MUSCULOSKELETAL:  No edema; No deformity  SKIN: Warm and dry NEUROLOGIC:  Alert and oriented x 3 PSYCHIATRIC:  Normal affect   ASSESSMENT:    1. Nonrheumatic aortic (valve) stenosis   2. Permanent atrial fibrillation (HCC)   3. Coronary artery disease involving native coronary artery of native heart without angina pectoris    PLAN:    In order of problems listed above:  The patient has progressive, now severe stage D1 aortic stenosis.  This is associated with dizziness and fatigue, NYHA functional class II.  He does not have much exertional dyspnea and denies any chest pain or anginal symptoms.  The patient's echocardiogram is personally reviewed and compared to his prior.  He exhibits echo findings consistent with progressive aortic stenosis.  The aortic valve is severely calcified and restricted.  The mean transvalvular gradient is now 44 mmHg, peak velocity 4.3 m/s, and calculated valve area 0.5 cm.  The dimensionless index is 0.17.  We reviewed the natural history of the aortic stenosis today.  We discussed treatment options, including palliative medical therapy, surgical aortic valve replacement, or transcatheter aortic valve replacement.  Considering his advanced age, history of stroke, and previous cardiac surgery, TAVR  would be an appropriate treatment consideration.  He will require further preoperative testing to include cardiac catheterization and bypass graft angiography with possible PCI.  He also will require CTA studies of the chest, abdomen, and pelvis as well as a gated CTA of the heart.  This will better define anatomic suitability for TAVR and whether he is a candidate for a transfemoral approach.  Once his studies are completed, he will be referred for cardiac surgical consultation as part of a multidisciplinary approach to his care. I have reviewed the risks, indications, and alternatives to cardiac catheterization, possible angioplasty, and stenting with the patient. Risks include but are not limited to bleeding, infection, vascular injury, stroke, myocardial infection, arrhythmia, kidney injury, radiation-related injury in the case of prolonged fluoroscopy use, emergency cardiac surgery, and death. The patient understands the risks of serious complication is 1-2 in 1000 with diagnostic cardiac cath and 1-2% or less with angioplasty/stenting.  Will plan   left radial approach. Stable, plan to minimize interruption of anticoagulation for cath (hold apixaban only evening prior and morning of cath procedure) Plan graft angiography as above      Shared Decision Making/Informed Consent The risks [stroke (1 in 1000), death (1 in 1000), kidney failure [usually temporary] (1 in 500), bleeding (1 in 200), allergic reaction [possibly serious] (1 in 200)], benefits (diagnostic support and management of coronary artery disease) and alternatives of a cardiac catheterization were discussed in detail with Mr. Engelhard and he is willing to proceed.    Medication Adjustments/Labs and Tests Ordered: Current medicines are reviewed at length with the patient today.  Concerns regarding medicines are outlined above.  Orders Placed This Encounter  Procedures   CBC   Basic metabolic panel   EKG 12-Lead   No orders of the  defined types were placed in this encounter.   Patient Instructions  Medication Instructions:  Your physician recommends that you continue on your current medications as directed. Please refer to the Current Medication list given to you today.  Stop taking hydrochlorothiazide and potassium  *If you need a refill on your cardiac medications before your next appointment, please call your pharmacy*   Lab Work: CBC and BMET Tuesday 6/18 at [address] office If you have labs (blood work) drawn today and your tests are completely normal, you will receive your results only by: MyChart Message (if you have MyChart) OR A paper copy in the mail If you have any lab test that is abnormal or we need to change your treatment, we will call you to review the results.   Testing/Procedures: Your physician has requested that you have a cardiac catheterization. Cardiac catheterization is used to diagnose and/or treat various heart conditions. Doctors may recommend this procedure for a number of different reasons. The most common reason is to evaluate chest pain. Chest pain can be a symptom of coronary artery disease (CAD), and cardiac catheterization can show whether plaque is narrowing or blocking your heart's arteries. This procedure is also used to evaluate the valves, as well as measure the blood flow and oxygen levels in different parts of your heart. For further information please visit www.cardiosmart.org. Please follow instruction sheet, as given.    Follow-Up: At [facility], you and your health needs are our priority.  As part of our continuing mission to provide you with exceptional heart care, we have created designated Provider Care Teams.  These Care Teams include your primary Cardiologist (physician) and Advanced Practice Providers (APPs -  Physician Assistants and Nurse Practitioners) who all work together to provide you with the care you need, when you need it.  Your next  appointment:   To be determined by structural team   Other Instructions  [facility] A DEPT OF [place]. [facility] [address], [address] [id number] [place] [address] Dept: [phone] Loc: [phone]  Rich W Bantz  09/28/2022  You are scheduled for a Cardiac Catheterization on Friday, June 21 with Dr. Codie Hainer.  1. Please arrive at the North Tower (Main Entrance A) at [facility]: [address] [place], [place] [address] at 8:30 AM (This time is 2 hour(s) before your procedure to ensure your preparation). Free valet parking service is available. You will check in at ADMITTING. The support person will be asked to wait in the waiting room.  It is OK to have someone drop you off and come   back when you are ready to be discharged.    Special note: Every effort is made to have your procedure done on time. Please understand that emergencies sometimes delay scheduled procedures.  2. Diet: Do not eat solid foods after midnight.  The patient may have clear liquids until 5am upon the day of the procedure.  3. Labs: You will need to have blood drawn on , June 18 at [facility] [address], [place]  Open: 7:30am - 5pm    Phone: [phone]. You do not need to be fasting.  4. Medication instructions in preparation for your procedure:   Contrast Allergy: No   Stop taking Eliquis (Apixiban) on Thursday, June 20. Take am dose only    On the morning of your procedure, take your Aspirin 81 mg and any morning medicines NOT listed above.  You may use sips of water.  5. Plan to go home the same day, you will only stay overnight if medically necessary. 6. Bring a current list of your medications and current insurance cards. 7. You MUST have a responsible person to drive you home. 8. Someone MUST be with you the first 24 hours after you arrive home or your  discharge will be delayed. 9. Please wear clothes that are easy to get on and off and wear slip-on shoes.  Thank you for allowing us to care for you!   -- [facility]    Signed, Joeseph Verville, MD  09/28/2022 5:05 PM    [facility] 

## 2022-09-28 NOTE — Patient Instructions (Signed)
Medication Instructions:  Your physician recommends that you continue on your current medications as directed. Please refer to the Current Medication list given to you today.  Stop taking hydrochlorothiazide and potassium  *If you need a refill on your cardiac medications before your next appointment, please call your pharmacy*   Lab Work: CBC and BMET Tuesday 6/18 at [address] office If you have labs (blood work) drawn today and your tests are completely normal, you will receive your results only by: MyChart Message (if you have MyChart) OR A paper copy in the mail If you have any lab test that is abnormal or we need to change your treatment, we will call you to review the results.   Testing/Procedures: Your physician has requested that you have a cardiac catheterization. Cardiac catheterization is used to diagnose and/or treat various heart conditions. Doctors may recommend this procedure for a number of different reasons. The most common reason is to evaluate chest pain. Chest pain can be a symptom of coronary artery disease (CAD), and cardiac catheterization can show whether plaque is narrowing or blocking your heart's arteries. This procedure is also used to evaluate the valves, as well as measure the blood flow and oxygen levels in different parts of your heart. For further information please visit [email or web address] Please follow instruction sheet, as given.    Follow-Up: At [facility], you and your health needs are our priority.  As part of our continuing mission to provide you with exceptional heart care, we have created designated Provider Care Teams.  These Care Teams include your primary Cardiologist (physician) and Advanced Practice Providers (APPs -  Physician Assistants and Nurse Practitioners) who all work together to provide you with the care you need, when you need it.  Your next appointment:   To be determined by structural team   Other  Instructions  [facility] A DEPT OF [place]. [facility] AT [facility] [address] [place], [place] [address] [id number] [facility] [place] [address] Dept: [phone] Loc: [phone]  [name] [name]  09/28/2022  You are scheduled for a Cardiac Catheterization on Friday, June 21 with Dr. Tonny Bollman.  1. Please arrive at the [facility] (Main Entrance A) at [facility]: [address] [place], [place] [address] at 8:30 AM (This time is 2 hour(s) before your procedure to ensure your preparation). Free valet parking service is available. You will check in at ADMITTING. The support person will be asked to wait in the waiting room.  It is OK to have someone drop you off and come back when you are ready to be discharged.    Special note: Every effort is made to have your procedure done on time. Please understand that emergencies sometimes delay scheduled procedures.  2. Diet: Do not eat solid foods after midnight.  The patient may have clear liquids until 5am upon the day of the procedure.  3. Labs: You will need to have blood drawn on , June 18 at [facility] at [facility]. [address], [place]  Open: 7:30am - 5pm    Phone: [phone]. You do not need to be fasting.  4. Medication instructions in preparation for your procedure:   Contrast Allergy: No   Stop taking Eliquis (Apixiban) on Thursday, June 20. Take am dose only    On the morning of your procedure, take your Aspirin 81 mg and any morning medicines NOT listed above.  You may use  sips of water.  5. Plan to go home the same day, you will only stay overnight if medically necessary. 6. Bring a current list of your medications and current insurance cards. 7. You MUST have a responsible person to drive you home. 8. Someone MUST be with you the first 24 hours after you arrive home or your discharge will be delayed. 9. Please wear clothes that are easy  to get on and off and wear slip-on shoes.  Thank you for allowing [place] to care for you!   -- [facility]

## 2022-09-28 NOTE — Progress Notes (Signed)
Cardiology Office Note:    Date:  09/28/2022   ID:  [name] [name], [place] [birth year], MRN [id number]  PCP:  Joaquim Nam, MD   [facility] Providers Cardiologist:  [name], MD     Referring MD: Joaquim Nam, MD   Chief Complaint  Patient presents with   Aortic Stenosis    History of Present Illness:    [name] [name] is a 88 y.o. male with aortic stenosis, presenting for follow-up evaluation.  The patient was initially seen here in October 2023.  His history is pertinent for coronary artery disease with remote CABG, ventricular tachycardia, permanent atrial fibrillation, and stroke.  His primary complaint at the time of his initial evaluation was dizziness that he had experienced ever since his stroke.  He was felt to have moderate to severe aortic stenosis and he recently returned for a 34-month follow-up echocardiogram for close surveillance.  The patient is here alone today.  He was added on after his echocardiogram showed progressive aortic stenosis.  The patient actually had a syncopal episode a few days ago.  He has been having a lot of dizziness over the last week and this came on pretty abruptly.  His dizziness is almost constant.  He has had problems with dizziness ever since his stroke last year, but symptoms had improved until recently.  The patient did have an occipital stroke in 2023.  He has had a difficult time the last several months as his wife had a mechanical fall in January of this year.  She had fractured vertebrae and has had very limited mobility since then.  She has Parkinson's disease and dementia as well.  He has her sole caregiver.  He has to help her transfer and she is dependent on his care.  Since his fall 4 days ago, he has had some gait unsteadiness.  He denies chest pain, chest pressure, or shortness of breath with activity.  He denies orthopnea, PND, or heart palpitations.  He has been compliant with his oral anticoagulation.  He  denies headache.  Past Medical History:  Diagnosis Date   Anticoagulated on Coumadin    followed in [facility]RR at [facility]   Aortic stenosis    a. Echo 2/15: Severe LVH, EF 55-60%, Gr 1 DD, mild to mod AS (mean 20 mmHg, peak 31 mmHg), AVA 1.4-1.5 cm2, mild AI, MAC, trivial MR, LA upper limits of normal, RVSP 22 mmHg, mild RAE;   b. Echo 2/16:  EF 55-60%, no RWMA, mild AS (mean 17 mmHg, peak 27 mmHg), AVA 1.5 cm2, mild AI, MAC, mild LAE   Arthritis    Bladder neoplasm    Chronic cough    NO CARDIAC OR PULMONARY RELATED   Complication of anesthesia    Coronary artery disease CARDIOLOGIST-  DR KLEIN/ ALLRED   a. s/p CABG 2004, b. LHC with patent grafts 07/2005, ejection fraction 50%;  c. 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 Amiodarone Rx   S/P CABG x 5 2004   Stroke [facility]) 03/10/2020  Stroke in the Left eye    Past Surgical History:  Procedure Laterality Date   CARDIAC CATHETERIZATION  07-26-2005  DR GAMBLE   PRESERVED LVF/  EF 50%/ PATENT GRAFTS   CARDIAC CATHETERIZATION  03-04-2003  DR GAMBLE   SEVERE 3 VESSEL DISEASE   CARDIAC CATHETERIZATION  1991  DR [facility]   PAF/ FALSE POSITIVE STRESS TEST   CAROTID ENDARTERECTOMY Left 2022   CATARACT EXTRACTION W/ INTRAOCULAR LENS IMPLANT Right    CORONARY ARTERY BYPASS GRAFT  03-08-2003  DR ZOXWRUEA   LIMA TO LAD/ SVG TO OM2/  SVG TO DIAGONAL / SVG TO PDA & PLA   CYSTOSCOPY WITH BIOPSY N/A 12/25/2012   Procedure: CYSTOSCOPY WITH BLADDER BIOPSY;  Surgeon: Antony Haste, MD;  Location: [facility];  Service: Urology;  Laterality: N/A;    ELECTROPHYSIOLOGY STUDY  07-27-2005  DR Sharlot Gowda TAYLOR   MAPPING --   RESULT NONINDUCIBLE VT OR SVT/  DX RIGHT VENTRICULAR OUTFLOW TRACT VT AND RULES OUT MORE MALIGNANT CAUSES OF VT   ENDARTERECTOMY Left 07/08/2020   Procedure: LEFT CAROTID ENDARTERECTOMY;  Surgeon: Nada Libman, MD;  Location: [facility] OR;  Service: Vascular;  Laterality: Left;   INGUINAL HERNIA REPAIR Bilateral 1954  &  1990   KNEE ARTHROSCOPY Right 1994   LUMBAR DISC SURGERY  1999   L4 -- L5   MOHS SURGERY     by Dr. Jeanella Craze 2019   REMOVAL VOCAL CORD POLYPS  1978   TONSILLECTOMY  AS CHILD   TRANSTHORACIC ECHOCARDIOGRAM  08/08/2011   MILD LVH/  EF 55-60%/  GRADE I DIASTOLIC DYSFUNCTION/ MILD AV STENOSIS    Current Medications: Current Meds  Medication Sig   acetaminophen (TYLENOL) 325 MG tablet Take 650 mg by mouth once as needed for moderate pain or headache.   apixaban (ELIQUIS) 5 MG TABS tablet Take 1 tablet (5 mg total) by mouth 2 (two) times daily. Take evening dose starting 07/09/20   butalbital-acetaminophen-caffeine (FIORICET, ESGIC) 50-325-40 MG per tablet Take 1 tablet by mouth 3 (three) times daily as needed for migraine.    cetirizine (ZYRTEC) 10 MG tablet Take 1 tablet (10 mg total) by mouth daily.   diltiazem (CARDIZEM CD) 120 MG 24 hr capsule Take 1 capsule (120 mg total) by mouth 2 (two) times daily.   fluticasone (FLONASE) 50 MCG/ACT nasal spray Place 1 spray into both nostrils daily. (Patient taking differently: Place 1 spray into both nostrils as needed.)   latanoprost (XALATAN) 0.005 % ophthalmic solution INSTILL 1 DROP IN BOTH EYES AT BEDTIME -  CONTACT PHARMACY WHEN NEW SUPPLY IS NEEDED   melatonin 3 MG TABS tablet Take 3 mg by mouth at bedtime.   Multiple Vitamins-Minerals (MULTIVITAMIN WITH MINERALS) tablet Take 1 tablet by mouth daily.   Polyethyl Glycol-Propyl Glycol (SYSTANE ULTRA OP) Apply 1-2 drops to eye once as needed (dry eyes.).    pravastatin (PRAVACHOL) 80 MG tablet Take 1 tablet (80 mg  total) by mouth every evening.   [DISCONTINUED] brimonidine (ALPHAGAN) 0.2 % ophthalmic solution Place into the left eye 2 (two) times daily.   [DISCONTINUED] Chlorpheniramine-DM 4-30 MG TABS Take 1 tablet by mouth 3 (three) times daily as needed.   [DISCONTINUED] hydrochlorothiazide (HYDRODIURIL) 25 MG tablet Take 1 tablet by mouth daily.   [DISCONTINUED] pantoprazole (PROTONIX) 40 MG tablet Take 1 tablet (40 mg total) by mouth daily.   [DISCONTINUED] potassium chloride (K-DUR) 10 MEQ tablet Take 10 mEq by mouth every morning. Take as directed  Allergies:   Ace inhibitors, Zocor [simvastatin], Nsaids, Prednisone, Requip [ropinirole], Cephalexin, Chocolate, Doxycycline, Gabapentin, Hycodan [hydrocodone bit-homatrop mbr], and Oxycodone   Social History   Socioeconomic History   Marital status: Married    Spouse name: [name]   Number of children: 3   Years of education: Not on file   Highest education level: Not on file  Occupational History   Occupation: Retired  Tobacco Use   Smoking status: Former    Packs/day: 1.00    Years: 25.00    Additional pack years: 0.00    Total pack years: 25.00    Types: Cigarettes    Quit date: 04/25/1980    Years since quitting: 42.4   Smokeless tobacco: Never  Vaping Use   Vaping Use: Never used  Substance and Sexual Activity   Alcohol use: No    Alcohol/week: 0.0 standard drinks of alcohol   Drug use: No   Sexual activity: Not on file  Other Topics Concern   Not on file  Social History Narrative   [employer] '56-'58, overseas to [place]   Some care through [place]   No service disability   Social Determinants of Health   Financial Resource Strain: Low Risk  (10/22/2021)   Overall Financial Resource Strain (CARDIA)    Difficulty of Paying Living Expenses: Not hard at all  Food Insecurity: No Food Insecurity (10/22/2021)   Hunger Vital Sign    Worried About Running Out of Food in the Last Year: Never true    Ran Out of Food in the Last Year: Never  true  Transportation Needs: No Transportation Needs (10/22/2021)   PRAPARE - Administrator, Civil Service (Medical): No    Lack of Transportation (Non-Medical): No  Physical Activity: Inactive (10/22/2021)   Exercise Vital Sign    Days of Exercise per Week: 0 days    Minutes of Exercise per Session: 0 min  Stress: No Stress Concern Present (10/22/2021)   [employer] of Occupational Health - Occupational Stress Questionnaire    Feeling of Stress : Not at all  Social Connections: Not on file     Family History: The patient's family history includes Cancer in his mother. There is no history of Stroke.  ROS:   Please see the history of present illness.    All other systems reviewed and are negative.  EKGs/Labs/Other Studies Reviewed:    The following studies were reviewed today: Cardiac Studies & Procedures       ECHOCARDIOGRAM  ECHOCARDIOGRAM COMPLETE 09/26/2022  Narrative ECHOCARDIOGRAM REPORT    Patient Name:   [name] [name] [facility] Date of Exam: 09/26/2022 Medical Rec #:  [id number]           Height:       70.0 in Accession #:    [id number]          Weight:       194.0 lb Date of Birth:  06-22-1934           BSA:          2.060 m Patient Age:    88 years            BP:           155/92 mmHg Patient Gender: M                   HR:           84 bpm. Exam Location:  [address]  Procedure: 2D Echo, Cardiac  Doppler and Color Doppler  Indications:    I35.0 Aortic Stenosis  History:        Patient has prior history of Echocardiogram examinations, most recent 01/31/2022. CAD, Prior CABG, Stroke, Arrythmias:Ventricular Tachycardia and Atrial Fibrillation; Risk Factors:Dyslipidemia.  Sonographer:    Clearence Ped RCS Referring Phys: [phone] Neshawn Aird  IMPRESSIONS   1. Left ventricular ejection fraction, by estimation, is 60 to 65%. The left ventricle has normal function. The left ventricle has no regional wall motion abnormalities. Left ventricular  diastolic parameters are indeterminate. 2. Right ventricular systolic function is normal. The right ventricular size is normal. There is normal pulmonary artery systolic pressure. 3. Left atrial size was moderately dilated. 4. The mitral valve is degenerative. Moderate mitral valve regurgitation. No evidence of mitral stenosis. 5. Tricuspid valve regurgitation is mild to moderate. 6. The aortic valve is calcified. There is moderate calcification of the aortic valve. There is moderate thickening of the aortic valve. Aortic valve regurgitation is moderate. Severe aortic valve stenosis. Aortic regurgitation PHT measures 591 msec. Aortic valve area, by VTI measures 0.54 cm. Aortic valve mean gradient measures 44.0 mmHg. Aortic valve Vmax measures 4.33 m/s. 7. There is mild dilatation of the ascending aorta, measuring 41 mm. 8. The inferior vena cava is normal in size with greater than 50% respiratory variability, suggesting right atrial pressure of 3 mmHg.  FINDINGS Left Ventricle: Left ventricular ejection fraction, by estimation, is 60 to 65%. The left ventricle has normal function. The left ventricle has no regional wall motion abnormalities. The left ventricular internal cavity size was normal in size. There is no left ventricular hypertrophy. Left ventricular diastolic function could not be evaluated due to mitral annular calcification (moderate or greater). Left ventricular diastolic parameters are indeterminate.  Right Ventricle: The right ventricular size is normal. No increase in right ventricular wall thickness. Right ventricular systolic function is normal. There is normal pulmonary artery systolic pressure. The tricuspid regurgitant velocity is 2.21 m/s, and with an assumed right atrial pressure of 3 mmHg, the estimated right ventricular systolic pressure is 22.5 mmHg.  Left Atrium: Left atrial size was moderately dilated.  Right Atrium: Right atrial size was normal in  size.  Pericardium: There is no evidence of pericardial effusion. Presence of epicardial fat layer.  Mitral Valve: The mitral valve is degenerative in appearance. Moderate mitral valve regurgitation. No evidence of mitral valve stenosis.  Tricuspid Valve: The tricuspid valve is normal in structure. Tricuspid valve regurgitation is mild to moderate. No evidence of tricuspid stenosis.  Aortic Valve: The aortic valve is calcified. There is moderate calcification of the aortic valve. There is moderate thickening of the aortic valve. There is moderate aortic valve annular calcification. Aortic valve regurgitation is moderate. Aortic regurgitation PHT measures 591 msec. Severe aortic stenosis is present. Aortic valve mean gradient measures 44.0 mmHg. Aortic valve peak gradient measures 75.0 mmHg. Aortic valve area, by VTI measures 0.54 cm.  Pulmonic Valve: The pulmonic valve was normal in structure. Pulmonic valve regurgitation is mild. No evidence of pulmonic stenosis.  Aorta: There is mild dilatation of the ascending aorta, measuring 41 mm.  Venous: The inferior vena cava is normal in size with greater than 50% respiratory variability, suggesting right atrial pressure of 3 mmHg.  IAS/Shunts: No atrial level shunt detected by color flow Doppler.   LEFT VENTRICLE PLAX 2D LVIDd:         5.20 cm   Diastology LVIDs:         3.50 cm  LV e' medial:    7.07 cm/s LV PW:         0.90 cm   LV E/e' medial:  15.3 LV IVS:        1.30 cm   LV e' lateral:   16.10 cm/s LVOT diam:     2.00 cm   LV E/e' lateral: 6.7 LV SV:         48 LV SV Index:   23 LVOT Area:     3.14 cm   RIGHT VENTRICLE RV Basal diam:  3.90 cm RV S prime:     12.20 cm/s TAPSE (M-mode): 1.3 cm  LEFT ATRIUM              Index        RIGHT ATRIUM           Index LA diam:        5.00 cm  2.43 cm/m   RA Area:     19.90 cm LA Vol (A2C):   109.0 ml 52.90 ml/m  RA Volume:   56.60 ml  27.47 ml/m LA Vol (A4C):   74.7 ml  36.25  ml/m LA Biplane Vol: 89.2 ml  43.29 ml/m AORTIC VALVE AV Area (Vmax):    0.50 cm AV Area (Vmean):   0.51 cm AV Area (VTI):     0.54 cm AV Vmax:           433.00 cm/s AV Vmean:          311.000 cm/s AV VTI:            0.893 m AV Peak Grad:      75.0 mmHg AV Mean Grad:      44.0 mmHg LVOT Vmax:         68.70 cm/s LVOT Vmean:        50.800 cm/s LVOT VTI:          0.153 m LVOT/AV VTI ratio: 0.17 AI PHT:            591 msec  AORTA Ao Root diam: 3.20 cm Ao Asc diam:  4.10 cm  MITRAL VALVE                TRICUSPID VALVE MV Area (PHT):              TR Peak grad:   19.5 mmHg MV Decel Time:              TR Vmax:        221.00 cm/s MR Peak grad: 57.5 mmHg MR Mean grad: 46.0 mmHg     SHUNTS MR Vmax:      379.00 cm/s   Systemic VTI:  0.15 m MR Vmean:     335.0 cm/s    Systemic Diam: 2.00 cm MV E velocity: 108.50 cm/s  Kardie Tobb DO Electronically signed by Thomasene Ripple DO Signature Date/Time: 09/26/2022/12:18:54 PM    Final              EKG:  EKG is ordered today.  The ekg ordered today demonstrates atrial fibrillation, right bundle branch block, minimal voltage criteria for LVH may be normal variant, possible inferior infarct age undetermined.  Recent Labs: 10/25/2021: ALT 16; Creatinine 0.9; Hemoglobin 14.2; Platelets 191 02/07/2022: NT-Pro BNP 667  Recent Lipid Panel    Component Value Date/Time   CHOL 170 10/25/2021 0000   CHOL 158 12/20/2017 0000   TRIG 74 10/25/2021 0000   TRIG 95 12/20/2017 0000  HDL 70 10/25/2021 0000   CHOLHDL 2 11/23/2020 1222   VLDL 13.2 11/23/2020 1222   LDLCALC 85 10/25/2021 0000   LDLCALC 74 12/20/2017 0000   LDLDIRECT 88 07/27/2020 1144     Risk Assessment/Calculations:    CHA2DS2-VASc Score = 6   This indicates a 9.7% annual risk of stroke. The patient's score is based upon: CHF History: 0 HTN History: 1 Diabetes History: 0 Stroke History: 2 Vascular Disease History: 1 Age Score: 2 Gender Score: 0                Physical Exam:    VS:  BP 120/84   Pulse 100   Ht 5\' 10"  (1.778 m)   Wt 179 lb 3.2 oz (81.3 kg)   SpO2 94%   BMI 25.71 kg/m     Wt Readings from Last 3 Encounters:  09/28/22 179 lb 3.2 oz (81.3 kg)  04/05/22 194 lb (88 kg)  03/31/22 193 lb 9.6 oz (87.8 kg)     GEN:  Well nourished, well developed in no acute distress HEENT: Normal NECK: No JVD; No carotid bruits LYMPHATICS: No lymphadenopathy CARDIAC: irregularly irregular, somewhat distant heart sounds, 3/6 harsh crescendo decrescendo murmur at the right upper sternal border with absent A2 RESPIRATORY:  Clear to auscultation without rales, wheezing or rhonchi  ABDOMEN: Soft, non-tender, non-distended MUSCULOSKELETAL:  No edema; No deformity  SKIN: Warm and dry NEUROLOGIC:  Alert and oriented x 3 PSYCHIATRIC:  Normal affect   ASSESSMENT:    1. Nonrheumatic aortic (valve) stenosis   2. Permanent atrial fibrillation (HCC)   3. Coronary artery disease involving native coronary artery of native heart without angina pectoris    PLAN:    In order of problems listed above:  The patient has progressive, now severe stage D1 aortic stenosis.  This is associated with dizziness and fatigue, NYHA functional class II.  He does not have much exertional dyspnea and denies any chest pain or anginal symptoms.  The patient's echocardiogram is personally reviewed and compared to his prior.  He exhibits echo findings consistent with progressive aortic stenosis.  The aortic valve is severely calcified and restricted.  The mean transvalvular gradient is now 44 mmHg, peak velocity 4.3 m/s, and calculated valve area 0.5 cm.  The dimensionless index is 0.17.  We reviewed the natural history of the aortic stenosis today.  We discussed treatment options, including palliative medical therapy, surgical aortic valve replacement, or transcatheter aortic valve replacement.  Considering his advanced age, history of stroke, and previous cardiac surgery, TAVR  would be an appropriate treatment consideration.  He will require further preoperative testing to include cardiac catheterization and bypass graft angiography with possible PCI.  He also will require CTA studies of the chest, abdomen, and pelvis as well as a gated CTA of the heart.  This will better define anatomic suitability for TAVR and whether he is a candidate for a transfemoral approach.  Once his studies are completed, he will be referred for cardiac surgical consultation as part of a multidisciplinary approach to his care. I have reviewed the risks, indications, and alternatives to cardiac catheterization, possible angioplasty, and stenting with the patient. Risks include but are not limited to bleeding, infection, vascular injury, stroke, myocardial infection, arrhythmia, kidney injury, radiation-related injury in the case of prolonged fluoroscopy use, emergency cardiac surgery, and death. The patient understands the risks of serious complication is 1-2 in 1000 with diagnostic cardiac cath and 1-2% or less with angioplasty/stenting.  Will plan  left radial approach. Stable, plan to minimize interruption of anticoagulation for cath (hold apixaban only evening prior and morning of cath procedure) Plan graft angiography as above      Shared Decision Making/Informed Consent The risks [stroke (1 in 1000), death (1 in 1000), kidney failure [usually temporary] (1 in 500), bleeding (1 in 200), allergic reaction [possibly serious] (1 in 200)], benefits (diagnostic support and management of coronary artery disease) and alternatives of a cardiac catheterization were discussed in detail with [name] [name] and he is willing to proceed.    Medication Adjustments/Labs and Tests Ordered: Current medicines are reviewed at length with the patient today.  Concerns regarding medicines are outlined above.  Orders Placed This Encounter  Procedures   CBC   Basic metabolic panel   EKG 12-Lead   No orders of the  defined types were placed in this encounter.   Patient Instructions  Medication Instructions:  Your physician recommends that you continue on your current medications as directed. Please refer to the Current Medication list given to you today.  Stop taking hydrochlorothiazide and potassium  *If you need a refill on your cardiac medications before your next appointment, please call your pharmacy*   Lab Work: CBC and BMET Tuesday 6/18 at [address] office If you have labs (blood work) drawn today and your tests are completely normal, you will receive your results only by: MyChart Message (if you have MyChart) OR A paper copy in the mail If you have any lab test that is abnormal or we need to change your treatment, we will call you to review the results.   Testing/Procedures: Your physician has requested that you have a cardiac catheterization. Cardiac catheterization is used to diagnose and/or treat various heart conditions. Doctors may recommend this procedure for a number of different reasons. The most common reason is to evaluate chest pain. Chest pain can be a symptom of coronary artery disease (CAD), and cardiac catheterization can show whether plaque is narrowing or blocking your heart's arteries. This procedure is also used to evaluate the valves, as well as measure the blood flow and oxygen levels in different parts of your heart. For further information please visit [email or web address] Please follow instruction sheet, as given.    Follow-Up: At [facility], you and your health needs are our priority.  As part of our continuing mission to provide you with exceptional heart care, we have created designated Provider Care Teams.  These Care Teams include your primary Cardiologist (physician) and Advanced Practice Providers (APPs -  Physician Assistants and Nurse Practitioners) who all work together to provide you with the care you need, when you need it.  Your next  appointment:   To be determined by structural team   Other Instructions  [facility] A DEPT OF [place]. [facility] AT [facility] [address] [place], [place] [address] [id number] [facility] [place] [address] Dept: [phone] Loc: [phone]  [name] [name]  09/28/2022  You are scheduled for a Cardiac Catheterization on Friday, June 21 with Dr. Tonny Bollman.  1. Please arrive at the [facility] (Main Entrance A) at [facility]: [address] [place], [place] [address] at 8:30 AM (This time is 2 hour(s) before your procedure to ensure your preparation). Free valet parking service is available. You will check in at ADMITTING. The support person will be asked to wait in the waiting room.  It is OK to have someone drop you off and come  back when you are ready to be discharged.    Special note: Every effort is made to have your procedure done on time. Please understand that emergencies sometimes delay scheduled procedures.  2. Diet: Do not eat solid foods after midnight.  The patient may have clear liquids until 5am upon the day of the procedure.  3. Labs: You will need to have blood drawn on , June 18 at [facility]. [address], [place]  Open: 7:30am - 5pm    Phone: [phone]. You do not need to be fasting.  4. Medication instructions in preparation for your procedure:   Contrast Allergy: No   Stop taking Eliquis (Apixiban) on Thursday, June 20. Take am dose only    On the morning of your procedure, take your Aspirin 81 mg and any morning medicines NOT listed above.  You may use sips of water.  5. Plan to go home the same day, you will only stay overnight if medically necessary. 6. Bring a current list of your medications and current insurance cards. 7. You MUST have a responsible person to drive you home. 8. Someone MUST be with you the first 24 hours after you arrive home or your  discharge will be delayed. 9. Please wear clothes that are easy to get on and off and wear slip-on shoes.  Thank you for allowing [place] to care for you!   -- [facility] Invasive Cardiovascular services    Signed, Tonny Bollman, MD  09/28/2022 5:05 PM    [facility]

## 2022-09-28 NOTE — Progress Notes (Addendum)
Pre Surgical Assessment: 5 M Walk Test  30M=16.102ft  5 Meter Walk Test- trial 1: 7.9 seconds 5 Meter Walk Test- trial 2: 7.35 seconds 5 Meter Walk Test- trial 3: 7.74 seconds 5 Meter Walk Test Average: 7.66 seconds    STS Surgical Risk calculation:   Procedure Type: Isolated AVR PERIOPERATIVE OUTCOME ESTIMATE % Operative Mortality 2.55% Morbidity & Mortality 8.37% Stroke 1.76% Renal Failure 0.866% Reoperation 2.97% Prolonged Ventilation 4.53% Deep Sternal Wound Infection 0.069% Long Hospital Stay (>14 days) 5.32% Short Hospital Stay (<6 days)* 32.3%

## 2022-09-29 ENCOUNTER — Encounter: Payer: Self-pay | Admitting: Cardiovascular Disease

## 2022-09-29 ENCOUNTER — Telehealth: Payer: Self-pay | Admitting: Cardiovascular Disease

## 2022-09-29 NOTE — Telephone Encounter (Signed)
Patient called to the nurse from yesterday appt with dr cooper. Please advise

## 2022-09-30 NOTE — Telephone Encounter (Signed)
Returned call to patient who was seen last week for TAVR work-up and was ordered to have cardiac cath and CT's. He states he has difficulty arranging someone to be with his wife who he takes care of, but did get it arranged for date of cath.  He reiterates that upon actual TAVR procedure scheduling, he has to coordinate with his son to come up here from [place]. Explained that once all results are back and he's seen TCTS, that lauren would help arrange that with him.

## 2022-09-30 NOTE — Telephone Encounter (Signed)
Patient called back wanting to speak with RN Maralyn Sago regarding scheduling a procedure.

## 2022-10-07 ENCOUNTER — Ambulatory Visit (INDEPENDENT_AMBULATORY_CARE_PROVIDER_SITE_OTHER): Payer: Medicare Other | Admitting: Family Medicine

## 2022-10-07 ENCOUNTER — Encounter: Payer: Self-pay | Admitting: Family Medicine

## 2022-10-07 VITALS — BP 122/78 | HR 97 | Temp 97.6°F | Ht 70.0 in | Wt 181.0 lb

## 2022-10-07 DIAGNOSIS — R42 Dizziness and giddiness: Secondary | ICD-10-CM | POA: Diagnosis not present

## 2022-10-07 DIAGNOSIS — I4821 Permanent atrial fibrillation: Secondary | ICD-10-CM

## 2022-10-07 DIAGNOSIS — R634 Abnormal weight loss: Secondary | ICD-10-CM | POA: Diagnosis not present

## 2022-10-07 LAB — COMPREHENSIVE METABOLIC PANEL
ALT: 17 U/L (ref 0–53)
AST: 21 U/L (ref 0–37)
Albumin: 4.4 g/dL (ref 3.5–5.2)
Alkaline Phosphatase: 74 U/L (ref 39–117)
BUN: 14 mg/dL (ref 6–23)
CO2: 32 mEq/L (ref 19–32)
Calcium: 9.1 mg/dL (ref 8.4–10.5)
Chloride: 102 mEq/L (ref 96–112)
Creatinine, Ser: 0.85 mg/dL (ref 0.40–1.50)
GFR: 78.92 mL/min (ref >60.00)
Glucose, Bld: 95 mg/dL (ref 70–99)
Potassium: 4.2 mEq/L (ref 3.5–5.1)
Sodium: 141 mEq/L (ref 135–145)
Total Bilirubin: 0.6 mg/dL (ref 0.2–1.2)
Total Protein: 7.2 g/dL (ref 6.0–8.3)

## 2022-10-07 LAB — CBC WITH DIFFERENTIAL/PLATELET
Basophils Absolute: 0 10*3/uL (ref 0.0–0.1)
Basophils Relative: 0.4 % (ref 0.0–3.0)
Eosinophils Absolute: 0 10*3/uL (ref 0.0–0.7)
Eosinophils Relative: 0.7 % (ref 0.0–5.0)
HCT: 40.1 % (ref 39.0–52.0)
Hemoglobin: 13.4 g/dL (ref 13.0–17.0)
Lymphocytes Relative: 22.7 % (ref 12.0–46.0)
Lymphs Abs: 1.4 10*3/uL (ref 0.7–4.0)
MCHC: 33.4 g/dL (ref 30.0–36.0)
MCV: 97.5 fl (ref 78.0–100.0)
Monocytes Absolute: 0.5 10*3/uL (ref 0.1–1.0)
Monocytes Relative: 8 % (ref 3.0–12.0)
Neutro Abs: 4.2 10*3/uL (ref 1.4–7.7)
Neutrophils Relative %: 68.2 % (ref 43.0–77.0)
Platelets: 215 10*3/uL (ref 150.0–400.0)
RBC: 4.11 Mil/uL — ABNORMAL LOW (ref 4.22–5.81)
RDW: 14.9 % (ref 11.5–15.5)
WBC: 6.1 10*3/uL (ref 4.0–10.5)

## 2022-10-07 LAB — TSH: TSH: 2.32 u[IU]/mL (ref 0.35–5.50)

## 2022-10-07 MED ORDER — DILTIAZEM HCL ER COATED BEADS 120 MG PO CP24: 120.0000 mg | ORAL_CAPSULE | Freq: Every day | ORAL | Status: DC

## 2022-10-07 MED ORDER — DILTIAZEM HCL 30 MG PO TABS: 30.0000 mg | ORAL_TABLET | Freq: Three times a day (TID) | ORAL | 1 refills | Status: DC

## 2022-10-07 NOTE — Patient Instructions (Addendum)
Go to the lab on the way out.   If you have mychart we'll likely use that to update you.    Take care.  Glad to see you. Stop 120mg  diltiazem and change to 30mg  tabs 3 times a day.    If you have more vertigo, then try the beside exercise.

## 2022-10-07 NOTE — Progress Notes (Unsigned)
Dizziness- he has a "swimmy head" recently.  The room clearly did spin previously.  Symptoms happening on and off x couple weeks. Happened the first time while sitting in recliner and has happened other times with walking.   H/o syncope- the room didn't spin with that- he talked to cards about that.  HCTZ and potassium stopped in the meantime.  The symptoms of that episode were clearly different.   Weight Loss  Patient has also been losing weight-discussed caring for his wife at home.  Meds, vitals, and allergies reviewed.   ROS: Per HPI unless specifically indicated in ROS section   GEN: nad, alert and oriented HEENT: ncat NECK: supple w/o LA CV: IRR with SEM noted.   PULM: ctab, no inc wob ABD: soft, +bs EXT: no edema SKIN: no acute rash  Labs collected today.

## 2022-10-09 DIAGNOSIS — R634 Abnormal weight loss: Secondary | ICD-10-CM | POA: Insufficient documentation

## 2022-10-09 NOTE — Assessment & Plan Note (Signed)
Likely related to caring for his wife, check routine labs today.  See notes on labs.

## 2022-10-09 NOTE — Assessment & Plan Note (Signed)
Complicated by relative hypotension.  He still can get lightheaded.  Previously had hydrochlorothiazide and potassium stopped.  Discussed lowering his diltiazem slightly. Stop 120mg  diltiazem and change to diltiazem 30mg  tabs 3 times a day.    I am going to route this to cardiology.  He had labs done today which should take care of his upcoming cardiology lab panel.

## 2022-10-09 NOTE — Assessment & Plan Note (Signed)
Likely separate from his other symptoms attributed to relative hypotension.  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  Discussed home exercises and BPV anatomy/pathophysiology.

## 2022-10-10 ENCOUNTER — Telehealth: Payer: Self-pay

## 2022-10-10 NOTE — Telephone Encounter (Signed)
Patient walked into clinic today to discuss potentially changing his upcoming cath date (TAVR workup) due to conflict with someone watching his wife for procedure. After much discussion, he chooses to keep current cath date as scheduled. He mentions that the dizziness he spoke with Dr Excell Seltzer about during visit has not improved. He had a recent syncopal episode two weeks ago due to dizziness. States he did stop his hydrochlorothiazide and KCL (as Cooper suggested) but it hasn't made any difference to it. Saw his PCP three days ago who thought it could be from the Diltiazem CD and changed him to Cardizem 30mg  IR three times daily, but still no change in three days. Overall, just wanted Dr Excell Seltzer to be aware. States dizziness is his only symptom-no SOB, no CP, no vision changes, no fatigue-just dizzy almost all the time. BP he reports is also good even after above changes, says its low 120's over 70's. Did explain to patient that it could be stemming from the aortic stenosis itself. Will route to MD to make aware. Per OV note on 09/27/21: he patient actually had a syncopal episode a few days ago. He has been having a lot of dizziness over the last week and this came on pretty abruptly. His dizziness is almost constant. He has had problems with dizziness ever since his stroke last year, but symptoms had improved until recently. The patient did have an occipital stroke in 2023.

## 2022-10-11 ENCOUNTER — Ambulatory Visit: Payer: Medicare Other

## 2022-10-11 ENCOUNTER — Encounter: Payer: Self-pay | Admitting: Cardiovascular Disease

## 2022-10-12 ENCOUNTER — Telehealth: Payer: Self-pay | Admitting: *Deleted

## 2022-10-12 NOTE — Telephone Encounter (Signed)
Cardiac Catheterization scheduled at [facility] for Friday October 14, 2022 10:30 AM Arrival time [facility] Main Entrance A at: 8:30 AM  Nothing to eat after midnight prior to procedure, clear liquids until 5 AM day of procedure.  Medication instructions: -Hold:  Eliquis-hold PM dose 10/13/22 and AM 10/14/22 -Other usual morning medications can be taken with sips of water including aspirin 81 mg.  Confirmed patient has responsible adult to drive home post procedure and be with patient first 24 hours after arriving home.  Plan to go home the same day, you will only stay overnight if medically necessary.  Reviewed procedure instructions with patient.

## 2022-10-14 ENCOUNTER — Encounter (HOSPITAL_COMMUNITY)
Admission: RE | Disposition: A | Discharge status: Home / Self Care | Payer: Self-pay | Source: Home / Self Care | Attending: Cardiovascular Disease

## 2022-10-14 ENCOUNTER — Other Ambulatory Visit: Payer: Self-pay

## 2022-10-14 ENCOUNTER — Ambulatory Visit (HOSPITAL_COMMUNITY)
Admission: RE | Admit: 2022-10-14 | Discharge: 2022-10-14 | Disposition: A | Discharge status: Home / Self Care | Payer: Medicare Other | Attending: Cardiovascular Disease | Admitting: Cardiovascular Disease

## 2022-10-14 DIAGNOSIS — I2582 Chronic total occlusion of coronary artery: Secondary | ICD-10-CM | POA: Diagnosis not present

## 2022-10-14 DIAGNOSIS — I2584 Coronary atherosclerosis due to calcified coronary lesion: Secondary | ICD-10-CM | POA: Insufficient documentation

## 2022-10-14 DIAGNOSIS — Z87891 Personal history of nicotine dependence: Secondary | ICD-10-CM | POA: Insufficient documentation

## 2022-10-14 DIAGNOSIS — Z8673 Personal history of transient ischemic attack (TIA), and cerebral infarction without residual deficits: Secondary | ICD-10-CM | POA: Insufficient documentation

## 2022-10-14 DIAGNOSIS — G20A1 Parkinson's disease without dyskinesia, without mention of fluctuations: Secondary | ICD-10-CM | POA: Insufficient documentation

## 2022-10-14 DIAGNOSIS — I2510 Atherosclerotic heart disease of native coronary artery without angina pectoris: Secondary | ICD-10-CM | POA: Insufficient documentation

## 2022-10-14 DIAGNOSIS — I350 Nonrheumatic aortic (valve) stenosis: Secondary | ICD-10-CM | POA: Insufficient documentation

## 2022-10-14 DIAGNOSIS — Z951 Presence of aortocoronary bypass graft: Secondary | ICD-10-CM | POA: Insufficient documentation

## 2022-10-14 DIAGNOSIS — I472 Ventricular tachycardia, unspecified: Secondary | ICD-10-CM | POA: Diagnosis not present

## 2022-10-14 DIAGNOSIS — I4821 Permanent atrial fibrillation: Secondary | ICD-10-CM | POA: Diagnosis not present

## 2022-10-14 DIAGNOSIS — F0280 Dementia in other diseases classified elsewhere without behavioral disturbance: Secondary | ICD-10-CM | POA: Insufficient documentation

## 2022-10-14 HISTORY — PX: LEFT HEART CATH AND CORS/GRAFTS ANGIOGRAPHY: CATH118250

## 2022-10-14 SURGERY — LEFT HEART CATH AND CORS/GRAFTS ANGIOGRAPHY
Anesthesia: LOCAL

## 2022-10-14 MED ORDER — LABETALOL HCL 5 MG/ML IV SOLN
10.0000 mg | INTRAVENOUS | Status: DC | PRN
  Administered 2022-10-14: 10 mg via INTRAVENOUS
  Filled 2022-10-14: qty 4

## 2022-10-14 MED ORDER — HEPARIN SODIUM (PORCINE) 1000 UNIT/ML IJ SOLN
INTRAMUSCULAR | Status: DC | PRN
  Administered 2022-10-14: 4000 [IU] via INTRAVENOUS

## 2022-10-14 MED ORDER — FENTANYL CITRATE (PF) 100 MCG/2ML IJ SOLN
INTRAMUSCULAR | Status: AC
  Filled 2022-10-14: qty 2

## 2022-10-14 MED ORDER — MIDAZOLAM HCL 2 MG/2ML IJ SOLN
INTRAMUSCULAR | Status: DC | PRN
  Administered 2022-10-14: 2 mg via INTRAVENOUS

## 2022-10-14 MED ORDER — SODIUM CHLORIDE 0.9 % WEIGHT BASED INFUSION: 1.0000 mL/kg/h | INTRAVENOUS | Status: DC

## 2022-10-14 MED ORDER — SODIUM CHLORIDE 0.9 % IV SOLN: 250.0000 mL | INTRAVENOUS | Status: DC | PRN

## 2022-10-14 MED ORDER — LIDOCAINE HCL (PF) 1 % IJ SOLN
INTRAMUSCULAR | Status: DC | PRN
  Administered 2022-10-14: 2 mL

## 2022-10-14 MED ORDER — ASPIRIN 81 MG PO CHEW: 81.0000 mg | CHEWABLE_TABLET | ORAL | Status: DC

## 2022-10-14 MED ORDER — FENTANYL CITRATE (PF) 100 MCG/2ML IJ SOLN
INTRAMUSCULAR | Status: DC | PRN
  Administered 2022-10-14: 25 ug via INTRAVENOUS

## 2022-10-14 MED ORDER — MIDAZOLAM HCL 2 MG/2ML IJ SOLN
INTRAMUSCULAR | Status: AC
  Filled 2022-10-14: qty 2

## 2022-10-14 MED ORDER — HEPARIN SODIUM (PORCINE) 1000 UNIT/ML IJ SOLN
INTRAMUSCULAR | Status: AC
  Filled 2022-10-14: qty 10

## 2022-10-14 MED ORDER — VERAPAMIL HCL 2.5 MG/ML IV SOLN
INTRAVENOUS | Status: DC | PRN
  Administered 2022-10-14: 10 mL via INTRA_ARTERIAL

## 2022-10-14 MED ORDER — ONDANSETRON HCL 4 MG/2ML IJ SOLN: 4.0000 mg | Freq: Four times a day (QID) | INTRAMUSCULAR | Status: DC | PRN

## 2022-10-14 MED ORDER — LIDOCAINE HCL (PF) 1 % IJ SOLN
INTRAMUSCULAR | Status: AC
  Filled 2022-10-14: qty 30

## 2022-10-14 MED ORDER — VERAPAMIL HCL 2.5 MG/ML IV SOLN
INTRAVENOUS | Status: AC
  Filled 2022-10-14: qty 2

## 2022-10-14 MED ORDER — HEPARIN (PORCINE) IN NACL 1000-0.9 UT/500ML-% IV SOLN
INTRAVENOUS | Status: DC | PRN
  Administered 2022-10-14 (×2): 500 mL

## 2022-10-14 MED ORDER — HYDRALAZINE HCL 20 MG/ML IJ SOLN: 10.0000 mg | INTRAMUSCULAR | Status: DC | PRN

## 2022-10-14 MED ORDER — METOPROLOL TARTRATE 50 MG PO TABS: ORAL_TABLET | ORAL | 0 refills | Status: DC

## 2022-10-14 MED ORDER — SODIUM CHLORIDE 0.9% FLUSH: 3.0000 mL | INTRAVENOUS | Status: DC | PRN

## 2022-10-14 MED ORDER — SODIUM CHLORIDE 0.9% FLUSH: 3.0000 mL | Freq: Two times a day (BID) | INTRAVENOUS | Status: DC

## 2022-10-14 MED ORDER — ACETAMINOPHEN 325 MG PO TABS: 650.0000 mg | ORAL_TABLET | ORAL | Status: DC | PRN

## 2022-10-14 MED ORDER — IOHEXOL 350 MG/ML SOLN
INTRAVENOUS | Status: DC | PRN
  Administered 2022-10-14: 70 mL

## 2022-10-14 MED ORDER — SODIUM CHLORIDE 0.9 % WEIGHT BASED INFUSION
3.0000 mL/kg/h | INTRAVENOUS | Status: AC
  Administered 2022-10-14: 3 mL/kg/h via INTRAVENOUS

## 2022-10-14 SURGICAL SUPPLY — 13 items
CATH EXPO 5F MPA-1 (CATHETERS) IMPLANT
CATH INFINITI 5 FR AL2 (CATHETERS) IMPLANT
CATH INFINITI 5FR MULTPACK ANG (CATHETERS) IMPLANT
DEVICE RAD COMP TR BAND LRG (VASCULAR PRODUCTS) IMPLANT
ELECT DEFIB PAD ADLT CADENCE (PAD) IMPLANT
GLIDESHEATH SLEND SS 6F .021 (SHEATH) IMPLANT
GUIDEWIRE INQWIRE 1.5J.035X260 (WIRE) IMPLANT
INQWIRE 1.5J .035X260CM (WIRE) ×1
KIT HEART LEFT (KITS) ×1 IMPLANT
PACK CARDIAC CATHETERIZATION (CUSTOM PROCEDURE TRAY) ×1 IMPLANT
TRANSDUCER W/STOPCOCK (MISCELLANEOUS) ×1 IMPLANT
TUBING CIL FLEX 10 FLL-RA (TUBING) ×1 IMPLANT
WIRE HI TORQ VERSACORE-J 145CM (WIRE) IMPLANT

## 2022-10-14 NOTE — Interval H&P Note (Signed)
History and Physical Interval Note:  10/14/2022 10:10 AM  [name] [name]  has presented today for surgery, with the diagnosis of pre tavr - aortic valve stenosis.  The various methods of treatment have been discussed with the patient and family. After consideration of risks, benefits and other options for treatment, the patient has consented to  Procedure(s): RIGHT/LEFT HEART CATH AND CORONARY/GRAFT ANGIOGRAPHY (N/A) as a surgical intervention.  The patient's history has been reviewed, patient examined, no change in status, stable for surgery.  I have reviewed the patient's chart and labs.  Questions were answered to the patient's satisfaction.     Tonny Bollman

## 2022-10-16 ENCOUNTER — Encounter: Payer: Self-pay | Admitting: Cardiovascular Disease

## 2022-10-17 ENCOUNTER — Encounter (HOSPITAL_COMMUNITY): Payer: Self-pay | Admitting: Cardiovascular Disease

## 2022-10-17 NOTE — Telephone Encounter (Signed)
Let's keep him off of it since his BP's are in good range. Thank you!

## 2022-10-18 ENCOUNTER — Other Ambulatory Visit: Payer: Self-pay

## 2022-10-18 NOTE — Progress Notes (Signed)
Tonny Bollman, MD  You15 hours ago (5:27 PM)    Let's keep him off of it since his BP's are in good range. Thank you!     MAR updated at this time-removed hydrochlorothiazide and KCL.

## 2022-10-19 ENCOUNTER — Ambulatory Visit (HOSPITAL_COMMUNITY)
Admission: RE | Admit: 2022-10-19 | Discharge: 2022-10-19 | Disposition: A | Discharge status: Home / Self Care | Payer: Medicare Other | Source: Ambulatory Visit | Attending: Cardiology | Admitting: Cardiology

## 2022-10-19 DIAGNOSIS — I350 Nonrheumatic aortic (valve) stenosis: Secondary | ICD-10-CM | POA: Diagnosis not present

## 2022-10-19 DIAGNOSIS — I77810 Thoracic aortic ectasia: Secondary | ICD-10-CM | POA: Diagnosis not present

## 2022-10-19 DIAGNOSIS — Z01818 Encounter for other preprocedural examination: Secondary | ICD-10-CM | POA: Diagnosis not present

## 2022-10-19 DIAGNOSIS — N289 Disorder of kidney and ureter, unspecified: Secondary | ICD-10-CM | POA: Diagnosis not present

## 2022-10-19 DIAGNOSIS — Z48812 Encounter for surgical aftercare following surgery on the circulatory system: Secondary | ICD-10-CM | POA: Diagnosis not present

## 2022-10-19 MED ORDER — IOHEXOL 350 MG/ML SOLN
100.0000 mL | Freq: Once | INTRAVENOUS | Status: AC | PRN
  Administered 2022-10-19: 100 mL via INTRAVENOUS

## 2022-10-21 ENCOUNTER — Other Ambulatory Visit: Payer: Self-pay | Admitting: *Deleted

## 2022-10-21 DIAGNOSIS — I6522 Occlusion and stenosis of left carotid artery: Secondary | ICD-10-CM

## 2022-10-24 ENCOUNTER — Encounter: Payer: Self-pay | Admitting: Cardiovascular Disease

## 2022-10-31 ENCOUNTER — Encounter: Payer: Self-pay | Admitting: Thoracic Surgery (Cardiothoracic Vascular Surgery)

## 2022-10-31 ENCOUNTER — Ambulatory Visit: Payer: Medicare Other | Admitting: Cardiovascular Disease

## 2022-11-07 ENCOUNTER — Encounter: Payer: Self-pay | Admitting: Cardiovascular Disease

## 2022-11-07 ENCOUNTER — Encounter (HOSPITAL_COMMUNITY): Payer: Medicare Other

## 2022-11-07 ENCOUNTER — Ambulatory Visit: Payer: Medicare Other

## 2022-11-07 ENCOUNTER — Encounter: Payer: Medicare Other | Admitting: Thoracic Surgery (Cardiothoracic Vascular Surgery)

## 2022-11-08 DIAGNOSIS — I350 Nonrheumatic aortic (valve) stenosis: Secondary | ICD-10-CM

## 2022-11-08 NOTE — Progress Notes (Deleted)
[address]       [place] [address]             [phone]           [name] [facility] Record #[id number] Date of Birth: 1934-07-09  [name] [name], [name] Poche Joaquim Nam, MD  Chief Complaint:     History of Present Illness:           Past Medical History:  Diagnosis Date   Anticoagulated on Coumadin    followed in CV[facility] at [facility]   Aortic stenosis    a. Echo 2/15: Severe LVH, EF 55-60%, Gr 1 DD, mild to mod AS (mean 20 mmHg, peak 31 mmHg), AVA 1.4-1.5 cm2, mild AI, MAC, trivial MR, LA upper limits of normal, RVSP 22 mmHg, mild RAE;   b. Echo 2/16:  EF 55-60%, no RWMA, mild AS (mean 17 mmHg, peak 27 mmHg), AVA 1.5 cm2, mild AI, MAC, mild LAE   Arthritis    Bladder neoplasm    Chronic cough    NO CARDIAC OR PULMONARY RELATED   Complication of anesthesia    Coronary artery disease CARDIOLOGIST-  DR KLEIN/ ALLRED   a. s/p CABG 2004, b. LHC with patent grafts 07/2005, ejection fraction 50%;  c. 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 Amiodarone Rx   S/P CABG x 5 2004   Stroke [facility]) 03/10/2020   Stroke in the Left eye    Past Surgical History:  Procedure Laterality Date   CARDIAC CATHETERIZATION  07-26-2005  DR GAMBLE   PRESERVED LVF/  EF 50%/ PATENT GRAFTS   CARDIAC CATHETERIZATION  03-04-2003  DR GAMBLE   SEVERE 3 VESSEL DISEASE   CARDIAC CATHETERIZATION  1991  DR [facility]   PAF/ FALSE POSITIVE STRESS TEST   CAROTID ENDARTERECTOMY Left 2022   CATARACT EXTRACTION W/ INTRAOCULAR LENS IMPLANT Right    CORONARY ARTERY BYPASS GRAFT   03-08-2003  DR [id number]   LIMA TO LAD/ SVG TO OM2/  SVG TO DIAGONAL / SVG TO PDA & PLA   CYSTOSCOPY WITH BIOPSY N/A 12/25/2012   Procedure: CYSTOSCOPY WITH BLADDER BIOPSY;  Surgeon: Antony Haste, MD;  Location: [facility];  Service: Urology;  Laterality: N/A;   ELECTROPHYSIOLOGY STUDY  07-27-2005  DR Lewayne Bunting   MAPPING --   RESULT NONINDUCIBLE VT OR SVT/  DX RIGHT VENTRICULAR OUTFLOW TRACT VT AND RULES OUT MORE MALIGNANT CAUSES OF VT   ENDARTERECTOMY Left 07/08/2020   Procedure: LEFT CAROTID ENDARTERECTOMY;  Surgeon: Nada Libman, MD;  Location: [facility] OR;  Service: Vascular;  Laterality: Left;   INGUINAL HERNIA REPAIR Bilateral 1954  &  1990   KNEE ARTHROSCOPY Right 1994   LEFT HEART CATH AND CORS/GRAFTS ANGIOGRAPHY N/A 10/14/2022   Procedure: LEFT HEART CATH AND CORS/GRAFTS ANGIOGRAPHY;  Surgeon: Tonny Bollman, MD;  Location: [facility] INVASIVE CV LAB;  Service: Cardiovascular;  Laterality: N/A;   LUMBAR DISC SURGERY  1999   L4 -- L5   MOHS SURGERY  by Dr. Jeanella Craze 2019   REMOVAL VOCAL CORD POLYPS  1978   TONSILLECTOMY  AS CHILD   TRANSTHORACIC ECHOCARDIOGRAM  08/08/2011   MILD LVH/  EF 55-60%/  GRADE I DIASTOLIC DYSFUNCTION/ MILD AV STENOSIS    Social History   Tobacco Use  Smoking Status Former   Current packs/day: 0.00   Average packs/day: 1 pack/day for 25.0 years (25.0 ttl pk-yrs)   Types: Cigarettes   Start date: 04/26/1955   Quit date: 04/25/1980   Years since quitting: 42.5  Smokeless Tobacco Never    Social History   Substance and Sexual Activity  Alcohol Use No   Alcohol/week: 0.0 standard drinks of alcohol    Social History   Socioeconomic History   Marital status: Married    Spouse name: [name] [name]   Number of children: 3   Years of education: Not on file   Highest education level: Not on file  Occupational History   Occupation: Retired  Tobacco Use   Smoking status: Former    Current packs/day: 0.00    Average packs/day: 1 pack/day  for 25.0 years (25.0 ttl pk-yrs)    Types: Cigarettes    Start date: 04/26/1955    Quit date: 04/25/1980    Years since quitting: 42.5   Smokeless tobacco: Never  Vaping Use   Vaping status: Never Used  Substance and Sexual Activity   Alcohol use: No    Alcohol/week: 0.0 standard drinks of alcohol   Drug use: No   Sexual activity: Not on file  Other Topics Concern   Not on file  Social History Narrative   [employer] '[employer]-'58, overseas to [place]   Some care through [place]   No service disability   Social Determinants of Health   Financial Resource Strain: Low Risk  (10/22/2021)   Overall Financial Resource Strain (CARDIA)    Difficulty of Paying Living Expenses: Not hard at all  Food Insecurity: No Food Insecurity (10/22/2021)   Hunger Vital Sign    Worried About Running Out of Food in the Last Year: Never true    Ran Out of Food in the Last Year: Never true  Transportation Needs: No Transportation Needs (10/22/2021)   PRAPARE - Administrator, Civil Service (Medical): No    Lack of Transportation (Non-Medical): No  Physical Activity: Inactive (10/22/2021)   Exercise Vital Sign    Days of Exercise per Week: 0 days    Minutes of Exercise per Session: 0 min  Stress: No Stress Concern Present (10/22/2021)   [employer] of Occupational Health - Occupational Stress Questionnaire    Feeling of Stress : Not at all  Social Connections: Unknown (09/06/2021)   Received from [facility]   Social Network    Social Network: Not on file  Intimate Partner Violence: Unknown (07/30/2021)   Received from [facility]   HITS    Physically Hurt: Not on file    Insult or Talk Down To: Not on file    Threaten Physical Harm: Not on file    Scream or Curse: Not on file    Allergies  Allergen Reactions   Ace Inhibitors Other (See Comments)    COUGH   Zocor [Simvastatin] Other (See Comments)    MYALGIA   Nsaids Other (See Comments)    Would avoid given anticoagulation   Prednisone  Other (See Comments)    Prednisone caused pt to have elevated BP.   Requip [Ropinirole]     didn't sleep well and had  trouble with abnormal dreams   Cephalexin Rash   Chocolate Other (See Comments)    Headaches    Doxycycline Other (See Comments)    Nausea and abd pain   Gabapentin Other (See Comments)    Dizziness   Hycodan [Hydrocodone Bit-Homatrop Mbr] Nausea And Vomiting   Oxycodone Nausea Only    Pt states that he cannot take prescription pain medication    Current Outpatient Medications  Medication Sig Dispense Refill   acetaminophen (TYLENOL) 325 MG tablet Take 650 mg by mouth once as needed for moderate pain or headache.     apixaban (ELIQUIS) 5 MG TABS tablet Take 1 tablet (5 mg total) by mouth 2 (two) times daily. Take evening dose starting 07/09/20 180 tablet 3   butalbital-acetaminophen-caffeine (FIORICET, ESGIC) 50-325-40 MG per tablet Take 1 tablet by mouth 3 (three) times daily as needed for migraine.      cetirizine (ZYRTEC) 10 MG tablet Take 1 tablet (10 mg total) by mouth daily.     diltiazem (CARDIZEM) 30 MG tablet Take 1 tablet (30 mg total) by mouth 3 (three) times daily. Stop 120mg  diltiazem 90 tablet 1   fluticasone (FLONASE) 50 MCG/ACT nasal spray Place 1 spray into both nostrils daily. (Patient taking differently: Place 1 spray into both nostrils daily as needed for allergies.) 16 g 1   latanoprost (XALATAN) 0.005 % ophthalmic solution INSTILL 1 DROP IN BOTH EYES AT BEDTIME -  CONTACT PHARMACY WHEN NEW SUPPLY IS NEEDED     melatonin 3 MG TABS tablet Take 3 mg by mouth at bedtime as needed (Sleep).     metoprolol tartrate (LOPRESSOR) 50 MG tablet Take as directed prior to 6/26 CT scan 1 tablet 0   Multiple Vitamins-Minerals (MULTIVITAMIN WITH MINERALS) tablet Take 1 tablet by mouth daily.     Polyethyl Glycol-Propyl Glycol (SYSTANE ULTRA OP) Apply 1-2 drops to eye once as needed (dry eyes.).      pravastatin (PRAVACHOL) 80 MG tablet Take 1 tablet (80 mg total) by  mouth every evening. 90 tablet 0   No current facility-administered medications for this visit.     Family History  Problem Relation Age of Onset   Cancer Mother    Stroke Neg Hx        Physical Exam:      Diagnostic Studies & Laboratory data: I have personally reviewed the following studies and agree with the findings   TTE (09/2022) IMPRESSIONS     1. Left ventricular ejection fraction, by estimation, is 60 to 65%. The  left ventricle has normal function. The left ventricle has no regional  wall motion abnormalities. Left ventricular diastolic parameters are  indeterminate.   2. Right ventricular systolic function is normal. The right ventricular  size is normal. There is normal pulmonary artery systolic pressure.   3. Left atrial size was moderately dilated.   4. The mitral valve is degenerative. Moderate mitral valve regurgitation.  No evidence of mitral stenosis.   5. Tricuspid valve regurgitation is mild to moderate.   6. The aortic valve is calcified. There is moderate calcification of the  aortic valve. There is moderate thickening of the aortic valve. Aortic  valve regurgitation is moderate. Severe aortic valve stenosis. Aortic  regurgitation PHT measures 591 msec.  Aortic valve area, by VTI measures 0.54 cm. Aortic valve mean gradient  measures 44.0 mmHg. Aortic valve Vmax measures 4.33 m/s.   7. There is mild dilatation of the ascending aorta, measuring 41 mm.   8.  The inferior vena cava is normal in size with greater than 50%  respiratory variability, suggesting right atrial pressure of 3 mmHg.   FINDINGS   Left Ventricle: Left ventricular ejection fraction, by estimation, is 60  to 65%. The left ventricle has normal function. The left ventricle has no  regional wall motion abnormalities. The left ventricular internal cavity  size was normal in size. There is   no left ventricular hypertrophy. Left ventricular diastolic function  could not be evaluated  due to mitral annular calcification (moderate or  greater). Left ventricular diastolic parameters are indeterminate.   Right Ventricle: The right ventricular size is normal. No increase in  right ventricular wall thickness. Right ventricular systolic function is  normal. There is normal pulmonary artery systolic pressure. The tricuspid  regurgitant velocity is 2.21 m/s, and   with an assumed right atrial pressure of 3 mmHg, the estimated right  ventricular systolic pressure is 22.5 mmHg.   Left Atrium: Left atrial size was moderately dilated.   Right Atrium: Right atrial size was normal in size.   Pericardium: There is no evidence of pericardial effusion. Presence of  epicardial fat layer.   Mitral Valve: The mitral valve is degenerative in appearance. Moderate  mitral valve regurgitation. No evidence of mitral valve stenosis.   Tricuspid Valve: The tricuspid valve is normal in structure. Tricuspid  valve regurgitation is mild to moderate. No evidence of tricuspid  stenosis.   Aortic Valve: The aortic valve is calcified. There is moderate  calcification of the aortic valve. There is moderate thickening of the  aortic valve. There is moderate aortic valve annular calcification. Aortic  valve regurgitation is moderate. Aortic  regurgitation PHT measures 591 msec. Severe aortic stenosis is present.  Aortic valve mean gradient measures 44.0 mmHg. Aortic valve peak gradient  measures 75.0 mmHg. Aortic valve area, by VTI measures 0.54 cm.   Pulmonic Valve: The pulmonic valve was normal in structure. Pulmonic valve  regurgitation is mild. No evidence of pulmonic stenosis.   Aorta: There is mild dilatation of the ascending aorta, measuring 41 mm.   Venous: The inferior vena cava is normal in size with greater than 50%  respiratory variability, suggesting right atrial pressure of 3 mmHg.   IAS/Shunts: No atrial level shunt detected by color flow Doppler.     LEFT VENTRICLE  PLAX  2D  LVIDd:         5.20 cm   Diastology  LVIDs:         3.50 cm   LV e' medial:    7.07 cm/s  LV PW:         0.90 cm   LV E/e' medial:  15.3  LV IVS:        1.30 cm   LV e' lateral:   16.10 cm/s  LVOT diam:     2.00 cm   LV E/e' lateral: 6.7  LV SV:         48  LV SV Index:   23  LVOT Area:     3.14 cm     RIGHT VENTRICLE  RV Basal diam:  3.90 cm  RV S prime:     12.20 cm/s  TAPSE (M-mode): 1.3 cm   LEFT ATRIUM              Index        RIGHT ATRIUM           Index  LA diam:  5.00 cm  2.43 cm/m   RA Area:     19.90 cm  LA Vol (A2C):   109.0 ml 52.90 ml/m  RA Volume:   56.60 ml  27.47 ml/m  LA Vol (A4C):   74.7 ml  36.25 ml/m  LA Biplane Vol: 89.2 ml  43.29 ml/m   AORTIC VALVE  AV Area (Vmax):    0.50 cm  AV Area (Vmean):   0.51 cm  AV Area (VTI):     0.54 cm  AV Vmax:           433.00 cm/s  AV Vmean:          311.000 cm/s  AV VTI:            0.893 m  AV Peak Grad:      75.0 mmHg  AV Mean Grad:      44.0 mmHg  LVOT Vmax:         68.70 cm/s  LVOT Vmean:        50.800 cm/s  LVOT VTI:          0.153 m  LVOT/AV VTI ratio: 0.17  AI PHT:            591 msec    AORTA  Ao Root diam: 3.20 cm  Ao Asc diam:  4.10 cm   MITRAL VALVE                TRICUSPID VALVE  MV Area (PHT):              TR Peak grad:   19.5 mmHg  MV Decel Time:              TR Vmax:        221.00 cm/s  MR Peak grad: 57.5 mmHg  MR Mean grad: 46.0 mmHg     SHUNTS  MR Vmax:      379.00 cm/s   Systemic VTI:  0.15 m  MR Vmean:     335.0 cm/s    Systemic Diam: 2.00 cm  MV E velocity: 108.50 cm/s   CATH (09/2022) Conclusion  1.  Severe native two-vessel coronary artery disease with severe left main stenosis, total occlusion of the LAD, total occlusion of the circumflex, and moderate calcified plaquing of the RCA 2.  Status post CABG with continued patency of the LIMA to LAD, saphenous vein graft to OM, saphenous vein graft to diagonal, and sequential saphenous vein graft to PDA and PLA branches 3.   Known severe aortic stenosis with direct catheter gradients of 32 mmHg mean   Plan: Continue evaluation for TAVR   Recent Radiology Findings:   CTA (09/2022) FINDINGS: Aortic Valve: Calcium Score 3643   Aorta: Aneurysmal ascending aorta. Normal arch vessels Pseudo coarctation with minimal diameter 25.8 mm dilates to 33 mm distally. Moderate calcific atherosclerosis   Sinotubular Junction: 31 mm   Ascending Thoracic Aorta: 40 mm   Aortic Arch: 32 mm   Descending Thoracic Aorta: 29 mm   Sinus of Valsalva Measurements:   Non-coronary: 37.5 mm   Right - coronary: 36.2 mm   Left - coronary: 38.4 mm   Coronary Artery Height above Annulus:   Left Main: 16.4 mm above annulus   Right Coronary: 22.5 mm above annulus   Virtual Basal Annulus Measurements:   Maximum/Minimum Diameter: 31 mm x 23.6 mm Average diameter 27.3 mm   Perimeter: 88.5 mm   Area: 586 mm2   Coronary Arteries: Sufficient height above annulus  for deployment   Optimum Fluoroscopic Angle for Delivery: LAO 11 Caudal 7 degrees   Membranous septal length 8.25 mm   IMPRESSION: 1. [name] leaflet AV with calcium score 3643   2.  Annular area of 586 mm 2 suitable for a 29 mm Sapien valve   3. Pseudo coarctation of the descending thoracic aorta just distal to the left subclavian take off   4.  Coronary arteries sufficient height above annulus for deployment   5. Optimum angiographic angle for deployment LAO 11 Caudal 7 degrees   6.  Membranous septal length 8.25 mm   7. Patent LIMA to LAD, Patent SVG;s to OM, D1 and sequential to PDA/PLB    Recent Lab Findings: Lab Results  Component Value Date   WBC 6.1 10/07/2022   HGB 13.4 10/07/2022   HCT 40.1 10/07/2022   PLT 215.0 10/07/2022   GLUCOSE 95 10/07/2022   CHOL 170 10/25/2021   TRIG 74 10/25/2021   HDL 70 10/25/2021   LDLDIRECT 88 07/27/2020   LDLCALC 85 10/25/2021   ALT 17 10/07/2022   AST 21 10/07/2022   NA 141 10/07/2022   K 4.2  10/07/2022   CL 102 10/07/2022   CREATININE 0.85 10/07/2022   BUN 14 10/07/2022   CO2 32 10/07/2022   TSH 2.32 10/07/2022   INR 1.1 07/02/2020   HGBA1C 5.9 10/27/2020      Assessment / Plan:        I have spent *** min in review of the records, viewing studies and in face to face with patient and in coordination of future care    Eugenio Hoes 11/08/2022 1:10 PM

## 2022-11-09 ENCOUNTER — Encounter: Payer: Medicare Other | Admitting: Thoracic Surgery (Cardiothoracic Vascular Surgery)

## 2022-11-10 ENCOUNTER — Encounter: Payer: Self-pay | Admitting: Thoracic Surgery (Cardiothoracic Vascular Surgery)

## 2022-11-10 ENCOUNTER — Institutional Professional Consult (permissible substitution): Payer: Medicare Other | Admitting: Thoracic Surgery (Cardiothoracic Vascular Surgery)

## 2022-11-10 VITALS — BP 155/78 | HR 84 | Resp 20 | Ht 70.0 in | Wt 182.0 lb

## 2022-11-10 DIAGNOSIS — I350 Nonrheumatic aortic (valve) stenosis: Secondary | ICD-10-CM

## 2022-11-10 NOTE — Progress Notes (Signed)
[address]       [place] [address]             [phone]           NAJIR ROOP [place] Medical Record #[id number] Date of Birth: 17-Nov-1934  Sheilah Pigeon, Cordelia Poche Joaquim Nam, MD  Chief Complaint:   Increasing dizziness and severe AS  History of Present Illness:     Pt is a very pleasant 88 yo male who has been followed for AS with the most recent echo revealing an increase in gradient across his aortic valve to mean gradient of and calculated valve area of 0.5cm2 with a DI of 0.17. Pt has seen an increase in his lightheadedness (had started after a CVA in the past) to the point where he had a syncopal event 4 weeks ago and again nearly the other day. He denies CP or DOE. He has long standing afib and is on anticoagulation. He was felt due to his increase in symptoms and worsening gradients to be a candidate for AVR and has been worked up with cath and CTA (see below) to have acceptable anatomy for TAVR. Pt is anxious to proceed due to his symptoms and need to care for his wife with parkinsons      Past Medical History:  Diagnosis Date   Anticoagulated on Coumadin    followed in [facility]RR at [facility]   Aortic stenosis    a. Echo 2/15: Severe LVH, EF 55-60%, Gr 1 DD, mild to mod AS (mean 20 mmHg, peak 31 mmHg), AVA 1.4-1.5 cm2, mild AI, MAC, trivial MR, LA upper limits of normal, RVSP 22 mmHg, mild RAE;   b. Echo 2/16:  EF 55-60%, no RWMA, mild AS (mean 17 mmHg, peak 27 mmHg), AVA 1.5 cm2, mild AI, MAC, mild LAE   Arthritis    Bladder neoplasm    Chronic cough    NO CARDIAC OR PULMONARY RELATED   Complication of anesthesia    Coronary artery disease CARDIOLOGIST-  DR KLEIN/ ALLRED   a. s/p CABG 2004, b. LHC with patent grafts 07/2005, ejection fraction 50%;  c. 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 Amiodarone Rx   S/P CABG x 5 2004   Stroke [facility]) 03/10/2020   Stroke in the Left eye    Past Surgical History:  Procedure Laterality Date   CARDIAC CATHETERIZATION  07-26-2005  DR GAMBLE   PRESERVED LVF/  EF 50%/ PATENT GRAFTS   CARDIAC CATHETERIZATION  03-04-2003  DR GAMBLE   SEVERE 3 VESSEL DISEASE   CARDIAC CATHETERIZATION  1991  DR [facility]   PAF/ FALSE POSITIVE STRESS TEST   CAROTID ENDARTERECTOMY Left 2022   CATARACT EXTRACTION W/ INTRAOCULAR LENS IMPLANT Right    CORONARY ARTERY BYPASS GRAFT  03-08-2003  DR [id number]   LIMA TO LAD/ SVG TO OM2/  SVG TO DIAGONAL / SVG TO PDA & PLA   CYSTOSCOPY WITH BIOPSY N/A 12/25/2012   Procedure: CYSTOSCOPY WITH BLADDER BIOPSY;  Surgeon:  Antony Haste, MD;  Location: [facility];  Service: Urology;  Laterality: N/A;   ELECTROPHYSIOLOGY STUDY  07-27-2005  DR Lewayne Bunting   MAPPING --   RESULT NONINDUCIBLE VT OR SVT/  DX RIGHT VENTRICULAR OUTFLOW TRACT VT AND RULES OUT MORE MALIGNANT CAUSES OF VT   ENDARTERECTOMY Left 07/08/2020   Procedure: LEFT CAROTID ENDARTERECTOMY;  Surgeon: Nada Libman, MD;  Location: [facility] OR;  Service: Vascular;  Laterality: Left;   INGUINAL HERNIA REPAIR Bilateral 1954  &  1990   KNEE ARTHROSCOPY Right 1994   LEFT HEART CATH AND CORS/GRAFTS ANGIOGRAPHY N/A 10/14/2022   Procedure: LEFT HEART CATH AND CORS/GRAFTS ANGIOGRAPHY;  Surgeon: Tonny Bollman, MD;  Location: [facility] INVASIVE CV LAB;  Service: Cardiovascular;  Laterality: N/A;   LUMBAR DISC SURGERY  1999   L4 -- L5   MOHS SURGERY     by Dr. Jeanella Craze 2019   REMOVAL VOCAL CORD POLYPS  1978   TONSILLECTOMY  AS CHILD   TRANSTHORACIC ECHOCARDIOGRAM  08/08/2011   MILD LVH/  EF 55-60%/  GRADE I  DIASTOLIC DYSFUNCTION/ MILD AV STENOSIS    Social History   Tobacco Use  Smoking Status Former   Current packs/day: 0.00   Average packs/day: 1 pack/day for 25.0 years (25.0 ttl pk-yrs)   Types: Cigarettes   Start date: 04/26/1955   Quit date: 04/25/1980   Years since quitting: 42.5  Smokeless Tobacco Never    Social History   Substance and Sexual Activity  Alcohol Use No   Alcohol/week: 0.0 standard drinks of alcohol    Social History   Socioeconomic History   Marital status: Married    Spouse name: [name]   Number of children: 3   Years of education: Not on file   Highest education level: Not on file  Occupational History   Occupation: Retired  Tobacco Use   Smoking status: Former    Current packs/day: 0.00    Average packs/day: 1 pack/day for 25.0 years (25.0 ttl pk-yrs)    Types: Cigarettes    Start date: 04/26/1955    Quit date: 04/25/1980    Years since quitting: 42.5   Smokeless tobacco: Never  Vaping Use   Vaping status: Never Used  Substance and Sexual Activity   Alcohol use: No    Alcohol/week: 0.0 standard drinks of alcohol   Drug use: No   Sexual activity: Not on file  Other Topics Concern   Not on file  Social History Narrative   [employer] '56-'58, overseas to [place]   Some care through [place]   No service disability   Social Determinants of Health   Financial Resource Strain: Low Risk  (10/22/2021)   Overall Financial Resource Strain (CARDIA)    Difficulty of Paying Living Expenses: Not hard at all  Food Insecurity: No Food Insecurity (10/22/2021)   Hunger Vital Sign    Worried About Running Out of Food in the Last Year: Never true    Ran Out of Food in the Last Year: Never true  Transportation Needs: No Transportation Needs (10/22/2021)   PRAPARE - Administrator, Civil Service (Medical): No    Lack of Transportation (Non-Medical): No  Physical Activity: Inactive (10/22/2021)   Exercise Vital Sign    Days of Exercise per Week: 0 days     Minutes of Exercise per Session: 0 min  Stress: No Stress Concern Present (10/22/2021)   [employer] of Occupational Health - Occupational Stress Questionnaire    Feeling  of Stress : Not at all  Social Connections: Unknown (09/06/2021)   Received from [facility]   Social Network    Social Network: Not on file  Intimate Partner Violence: Unknown (07/30/2021)   Received from [facility]   HITS    Physically Hurt: Not on file    Insult or Talk Down To: Not on file    Threaten Physical Harm: Not on file    Scream or Curse: Not on file    Allergies  Allergen Reactions   Ace Inhibitors Other (See Comments)    COUGH   Zocor [Simvastatin] Other (See Comments)    MYALGIA   Nsaids Other (See Comments)    Would avoid given anticoagulation   Prednisone Other (See Comments)    Prednisone caused pt to have elevated BP.   Requip [Ropinirole]     didn't sleep well and had trouble with abnormal dreams   Cephalexin Rash   Chocolate Other (See Comments)    Headaches    Doxycycline Other (See Comments)    Nausea and abd pain   Gabapentin Other (See Comments)    Dizziness   Hycodan [Hydrocodone Bit-Homatrop Mbr] Nausea And Vomiting   Oxycodone Nausea Only    Pt states that he cannot take prescription pain medication    Current Outpatient Medications  Medication Sig Dispense Refill   acetaminophen (TYLENOL) 325 MG tablet Take 650 mg by mouth once as needed for moderate pain or headache.     apixaban (ELIQUIS) 5 MG TABS tablet Take 1 tablet (5 mg total) by mouth 2 (two) times daily. Take evening dose starting 07/09/20 180 tablet 3   butalbital-acetaminophen-caffeine (FIORICET, ESGIC) 50-325-40 MG per tablet Take 1 tablet by mouth 3 (three) times daily as needed for migraine.      cetirizine (ZYRTEC) 10 MG tablet Take 1 tablet (10 mg total) by mouth daily.     diltiazem (CARDIZEM CD) 120 MG 24 hr capsule Take 120 mg by mouth daily.     diltiazem (CARDIZEM) 30 MG tablet Take 1 tablet  (30 mg total) by mouth 3 (three) times daily. Stop 120mg  diltiazem (Patient not taking: Reported on 11/09/2022) 90 tablet 1   fluticasone (FLONASE) 50 MCG/ACT nasal spray Place 1 spray into both nostrils daily. (Patient taking differently: Place 1 spray into both nostrils daily as needed for allergies.) 16 g 1   latanoprost (XALATAN) 0.005 % ophthalmic solution Place 1 drop into both eyes at bedtime.     melatonin 3 MG TABS tablet Take 3 mg by mouth at bedtime as needed (Sleep).     Multiple Vitamins-Minerals (MULTIVITAMIN WITH MINERALS) tablet Take 1 tablet by mouth daily.     Polyethyl Glycol-Propyl Glycol (SYSTANE ULTRA OP) Place 1-2 drops into both eyes once as needed (dry eyes.).     pravastatin (PRAVACHOL) 80 MG tablet Take 1 tablet (80 mg total) by mouth every evening. 90 tablet 0   No current facility-administered medications for this visit.     Family History  Problem Relation Age of Onset   Cancer Mother    Stroke Neg Hx        Physical Exam: Healthy appearing Teeth in good repair Lungs: clear Card: IRR with harsh systolic murmur Ext: no edema Neuro: alert and no focal deficits     Diagnostic Studies & Laboratory data: I have personally reviewed the following studies and agree with the findings   TTE (09/2022) IMPRESSIONS     1. Left ventricular ejection fraction, by estimation,  is 60 to 65%. The  left ventricle has normal function. The left ventricle has no regional  wall motion abnormalities. Left ventricular diastolic parameters are  indeterminate.   2. Right ventricular systolic function is normal. The right ventricular  size is normal. There is normal pulmonary artery systolic pressure.   3. Left atrial size was moderately dilated.   4. The mitral valve is degenerative. Moderate mitral valve regurgitation.  No evidence of mitral stenosis.   5. Tricuspid valve regurgitation is mild to moderate.   6. The aortic valve is calcified. There is moderate  calcification of the  aortic valve. There is moderate thickening of the aortic valve. Aortic  valve regurgitation is moderate. Severe aortic valve stenosis. Aortic  regurgitation PHT measures 591 msec.  Aortic valve area, by VTI measures 0.54 cm. Aortic valve mean gradient  measures 44.0 mmHg. Aortic valve Vmax measures 4.33 m/s.   7. There is mild dilatation of the ascending aorta, measuring 41 mm.   8. The inferior vena cava is normal in size with greater than 50%  respiratory variability, suggesting right atrial pressure of 3 mmHg.   FINDINGS   Left Ventricle: Left ventricular ejection fraction, by estimation, is 60  to 65%. The left ventricle has normal function. The left ventricle has no  regional wall motion abnormalities. The left ventricular internal cavity  size was normal in size. There is   no left ventricular hypertrophy. Left ventricular diastolic function  could not be evaluated due to mitral annular calcification (moderate or  greater). Left ventricular diastolic parameters are indeterminate.   Right Ventricle: The right ventricular size is normal. No increase in  right ventricular wall thickness. Right ventricular systolic function is  normal. There is normal pulmonary artery systolic pressure. The tricuspid  regurgitant velocity is 2.21 m/s, and   with an assumed right atrial pressure of 3 mmHg, the estimated right  ventricular systolic pressure is 22.5 mmHg.   Left Atrium: Left atrial size was moderately dilated.   Right Atrium: Right atrial size was normal in size.   Pericardium: There is no evidence of pericardial effusion. Presence of  epicardial fat layer.   Mitral Valve: The mitral valve is degenerative in appearance. Moderate  mitral valve regurgitation. No evidence of mitral valve stenosis.   Tricuspid Valve: The tricuspid valve is normal in structure. Tricuspid  valve regurgitation is mild to moderate. No evidence of tricuspid  stenosis.   Aortic  Valve: The aortic valve is calcified. There is moderate  calcification of the aortic valve. There is moderate thickening of the  aortic valve. There is moderate aortic valve annular calcification. Aortic  valve regurgitation is moderate. Aortic  regurgitation PHT measures 591 msec. Severe aortic stenosis is present.  Aortic valve mean gradient measures 44.0 mmHg. Aortic valve peak gradient  measures 75.0 mmHg. Aortic valve area, by VTI measures 0.54 cm.   Pulmonic Valve: The pulmonic valve was normal in structure. Pulmonic valve  regurgitation is mild. No evidence of pulmonic stenosis.   Aorta: There is mild dilatation of the ascending aorta, measuring 41 mm.   Venous: The inferior vena cava is normal in size with greater than 50%  respiratory variability, suggesting right atrial pressure of 3 mmHg.   IAS/Shunts: No atrial level shunt detected by color flow Doppler.     LEFT VENTRICLE  PLAX 2D  LVIDd:         5.20 cm   Diastology  LVIDs:  3.50 cm   LV e' medial:    7.07 cm/s  LV PW:         0.90 cm   LV E/e' medial:  15.3  LV IVS:        1.30 cm   LV e' lateral:   16.10 cm/s  LVOT diam:     2.00 cm   LV E/e' lateral: 6.7  LV SV:         48  LV SV Index:   23  LVOT Area:     3.14 cm     RIGHT VENTRICLE  RV Basal diam:  3.90 cm  RV S prime:     12.20 cm/s  TAPSE (M-mode): 1.3 cm   LEFT ATRIUM              Index        RIGHT ATRIUM           Index  LA diam:        5.00 cm  2.43 cm/m   RA Area:     19.90 cm  LA Vol (A2C):   109.0 ml 52.90 ml/m  RA Volume:   56.60 ml  27.47 ml/m  LA Vol (A4C):   74.7 ml  36.25 ml/m  LA Biplane Vol: 89.2 ml  43.29 ml/m   AORTIC VALVE  AV Area (Vmax):    0.50 cm  AV Area (Vmean):   0.51 cm  AV Area (VTI):     0.54 cm  AV Vmax:           433.00 cm/s  AV Vmean:          311.000 cm/s  AV VTI:            0.893 m  AV Peak Grad:      75.0 mmHg  AV Mean Grad:      44.0 mmHg  LVOT Vmax:         68.70 cm/s  LVOT Vmean:         50.800 cm/s  LVOT VTI:          0.153 m  LVOT/AV VTI ratio: 0.17  AI PHT:            591 msec    AORTA  Ao Root diam: 3.20 cm  Ao Asc diam:  4.10 cm   MITRAL VALVE                TRICUSPID VALVE  MV Area (PHT):              TR Peak grad:   19.5 mmHg  MV Decel Time:              TR Vmax:        221.00 cm/s  MR Peak grad: 57.5 mmHg  MR Mean grad: 46.0 mmHg     SHUNTS  MR Vmax:      379.00 cm/s   Systemic VTI:  0.15 m  MR Vmean:     335.0 cm/s    Systemic Diam: 2.00 cm  MV E velocity: 108.50 cm/s   CATH (09/2022) Conclusion  1.  Severe native two-vessel coronary artery disease with severe left main stenosis, total occlusion of the LAD, total occlusion of the circumflex, and moderate calcified plaquing of the RCA 2.  Status post CABG with continued patency of the LIMA to LAD, saphenous vein graft to OM, saphenous vein graft to diagonal, and sequential saphenous vein graft to PDA and PLA  branches 3.  Known severe aortic stenosis with direct catheter gradients of 32 mmHg mean    Recent Radiology Findings:   CTA (09/2022) FINDINGS: Aortic Valve: Calcium Score 3643   Aorta: Aneurysmal ascending aorta. Normal arch vessels Pseudo coarctation with minimal diameter 25.8 mm dilates to 33 mm distally. Moderate calcific atherosclerosis   Sinotubular Junction: 31 mm   Ascending Thoracic Aorta: 40 mm   Aortic Arch: 32 mm   Descending Thoracic Aorta: 29 mm   Sinus of Valsalva Measurements:   Non-coronary: 37.5 mm   Right - coronary: 36.2 mm   Left - coronary: 38.4 mm   Coronary Artery Height above Annulus:   Left Main: 16.4 mm above annulus   Right Coronary: 22.5 mm above annulus   Virtual Basal Annulus Measurements:   Maximum/Minimum Diameter: 31 mm x 23.6 mm Average diameter 27.3 mm   Perimeter: 88.5 mm   Area: 586 mm2   Coronary Arteries: Sufficient height above annulus for deployment   Optimum Fluoroscopic Angle for Delivery: LAO 11 Caudal 7 degrees   Membranous  septal length 8.25 mm   IMPRESSION: 1. [name] leaflet AV with calcium score 3643   2.  Annular area of 586 mm 2 suitable for a 29 mm Sapien valve   3. Pseudo coarctation of the descending thoracic aorta just distal to the left subclavian take off   4.  Coronary arteries sufficient height above annulus for deployment   5. Optimum angiographic angle for deployment LAO 11 Caudal 7 degrees   6.  Membranous septal length 8.25 mm   7. Patent LIMA to LAD, Patent SVG;s to OM, D1 and sequential to PDA/PLB   [name]     Electronically Signed   By: [name] M.D.   On: 10/19/2022 14:23    Addended by Wendall Stade, MD on 10/19/2022  2:26 PM    Study Result  Narrative & Impression  EXAM: OVER-READ INTERPRETATION  CT CHEST   The following report is a limited chest CT over-read performed by radiologist Dr. Allegra Lai of [facility] on 10/19/2022. This over-read does not include interpretation of cardiac or coronary anatomy or pathology. The cardiac TAVR interpretation by the cardiologist is attached.   COMPARISON:  None Available.   FINDINGS: Extracardiac findings will be described separately under dictation for contemporaneously obtained CTA chest, abdomen and pelvis.   IMPRESSION: Please see separate dictation for contemporaneously obtained CTA chest, abdomen and pelvis dated 10/19/2022 for full description of relevant extracardiac findings.         Recent Lab Findings: Lab Results  Component Value Date   WBC 6.1 10/07/2022   HGB 13.4 10/07/2022   HCT 40.1 10/07/2022   PLT 215.0 10/07/2022   GLUCOSE 95 10/07/2022   CHOL 170 10/25/2021   TRIG 74 10/25/2021   HDL 70 10/25/2021   LDLDIRECT 88 07/27/2020   LDLCALC 85 10/25/2021   ALT 17 10/07/2022   AST 21 10/07/2022   NA 141 10/07/2022   K 4.2 10/07/2022   CL 102 10/07/2022   CREATININE 0.85 10/07/2022   BUN 14 10/07/2022   CO2 32 10/07/2022   TSH 2.32 10/07/2022   INR 1.1  07/02/2020   HGBA1C 5.9 10/27/2020      Assessment / Plan:     88 yo male with NYHA class 3 symptoms of severe AS with normal LV function and no significant obstruction sp cabg. Pt with his age and comorbidities would best be served with TAVR and he  agrees and wishes to proceed. He understands the risks and goals of surgery and the recovery. He would not be a surgical bailout. He has anatomy for a 29mm Sapien via the femoral approach.   I have spent 60 min in review of the records, viewing studies and in face to face with patient and in coordination of future care    Eugenio Hoes 11/10/2022 7:39 AM

## 2022-11-10 NOTE — Patient Instructions (Signed)
TAVR on teusday

## 2022-11-11 ENCOUNTER — Encounter (HOSPITAL_COMMUNITY)
Admission: RE | Admit: 2022-11-11 | Discharge: 2022-11-11 | Disposition: A | Discharge status: Home / Self Care | Payer: Medicare Other | Source: Ambulatory Visit | Attending: Cardiovascular Disease | Admitting: Cardiovascular Disease

## 2022-11-11 ENCOUNTER — Other Ambulatory Visit: Payer: Self-pay

## 2022-11-11 ENCOUNTER — Ambulatory Visit (HOSPITAL_COMMUNITY)
Admission: RE | Admit: 2022-11-11 | Discharge: 2022-11-11 | Disposition: A | Discharge status: Home / Self Care | Payer: Medicare Other | Source: Ambulatory Visit | Attending: Cardiovascular Disease | Admitting: Cardiovascular Disease

## 2022-11-11 VITALS — BP 150/82 | HR 88 | Temp 97.9°F | Resp 18 | Ht 70.0 in | Wt 183.0 lb

## 2022-11-11 DIAGNOSIS — Z1152 Encounter for screening for COVID-19: Secondary | ICD-10-CM | POA: Diagnosis not present

## 2022-11-11 DIAGNOSIS — I517 Cardiomegaly: Secondary | ICD-10-CM | POA: Diagnosis not present

## 2022-11-11 DIAGNOSIS — I350 Nonrheumatic aortic (valve) stenosis: Secondary | ICD-10-CM

## 2022-11-11 DIAGNOSIS — I4891 Unspecified atrial fibrillation: Secondary | ICD-10-CM | POA: Diagnosis not present

## 2022-11-11 DIAGNOSIS — Z951 Presence of aortocoronary bypass graft: Secondary | ICD-10-CM | POA: Diagnosis not present

## 2022-11-11 DIAGNOSIS — Z01818 Encounter for other preprocedural examination: Secondary | ICD-10-CM | POA: Diagnosis not present

## 2022-11-11 LAB — COMPREHENSIVE METABOLIC PANEL
ALT: 16 U/L (ref 0–44)
AST: 23 U/L (ref 15–41)
Albumin: 4.2 g/dL (ref 3.5–5.0)
Alkaline Phosphatase: 62 U/L (ref 38–126)
Anion gap: 7 (ref 5–15)
BUN: 14 mg/dL (ref 8–23)
CO2: 30 mmol/L (ref 22–32)
Calcium: 9.2 mg/dL (ref 8.9–10.3)
Chloride: 102 mmol/L (ref 98–111)
Creatinine, Ser: 0.79 mg/dL (ref 0.61–1.24)
GFR, Estimated: >60 mL/min (ref >60)
Glucose, Bld: 100 mg/dL — ABNORMAL HIGH (ref 70–99)
Potassium: 3.7 mmol/L (ref 3.5–5.1)
Sodium: 139 mmol/L (ref 135–145)
Total Bilirubin: 0.6 mg/dL (ref 0.3–1.2)
Total Protein: 7.3 g/dL (ref 6.5–8.1)

## 2022-11-11 LAB — URINALYSIS, ROUTINE W REFLEX MICROSCOPIC
Bilirubin Urine: NEGATIVE
Glucose, UA: NEGATIVE mg/dL
Hgb urine dipstick: NEGATIVE
Ketones, ur: NEGATIVE mg/dL
Leukocytes,Ua: NEGATIVE
Nitrite: NEGATIVE
Protein, ur: NEGATIVE mg/dL
Specific Gravity, Urine: 1.008 (ref 1.005–1.030)
pH: 7 (ref 5.0–8.0)

## 2022-11-11 LAB — TYPE AND SCREEN
ABO/RH(D): A POS
Antibody Screen: NEGATIVE

## 2022-11-11 LAB — CBC
HCT: 41.8 % (ref 39.0–52.0)
Hemoglobin: 13.7 g/dL (ref 13.0–17.0)
MCH: 31.9 pg (ref 26.0–34.0)
MCHC: 32.8 g/dL (ref 30.0–36.0)
MCV: 97.4 fL (ref 80.0–100.0)
Platelets: 173 10*3/uL (ref 150–400)
RBC: 4.29 MIL/uL (ref 4.22–5.81)
RDW: 14.5 % (ref 11.5–15.5)
WBC: 5.6 10*3/uL (ref 4.0–10.5)
nRBC: 0 % (ref 0.0–0.2)

## 2022-11-11 LAB — SURGICAL PCR SCREEN
MRSA, PCR: NEGATIVE
Staphylococcus aureus: NEGATIVE

## 2022-11-11 LAB — PROTIME-INR
INR: 1 (ref 0.8–1.2)
Prothrombin Time: 13.4 seconds (ref 11.4–15.2)

## 2022-11-11 LAB — SARS CORONAVIRUS 2 BY RT PCR: SARS Coronavirus 2 by RT PCR: NEGATIVE

## 2022-11-11 NOTE — Progress Notes (Signed)
Patient arrived for TAVR lab appointment. Pt states that he has baseline dizziness and passed out about a month ago. Pt states that he moves slowly d/t currently having dizziness. Pt states that Dr. Leafy Ro is aware of this. Pt states this dizziness is no worse than when he saw Dr. Leafy Ro yesterday. Pt denies chest pain, shortness of breath. Pt states he is not dizzy at rest, only when standing up/walking. VSS. Anesthesia APP notified and no new orders at this time. Labs, Covid test, EKG, CXR performed per surgeon order. Pt given instructions on CHG showering and given CHG soap. Pt confirmed that he has information at home regarding his medications and showering prior to surgery. Pt given another copy of letter from Julieta Gutting, RN regarding surgical instructions per patient request.

## 2022-11-11 NOTE — Progress Notes (Signed)
Called patient and made him aware that due to a national network outage, today's pre-TAVR lab appointment has been rescheduled to Monday 11/14/22 at 0920 am. Patient verbalized understanding and stated he will call back if he cannot find a sitter for his wife on Monday.

## 2022-11-14 ENCOUNTER — Other Ambulatory Visit (HOSPITAL_COMMUNITY): Payer: Medicare Other

## 2022-11-14 MED ORDER — NOREPINEPHRINE 4 MG/250ML-% IV SOLN
0.0000 ug/min | INTRAVENOUS | Status: DC
  Filled 2022-11-14: qty 250

## 2022-11-14 MED ORDER — MAGNESIUM SULFATE 50 % IJ SOLN
40.0000 meq | INTRAMUSCULAR | Status: DC
  Filled 2022-11-14: qty 9.85

## 2022-11-14 MED ORDER — VANCOMYCIN HCL IN DEXTROSE 1-5 GM/200ML-% IV SOLN
1000.0000 mg | INTRAVENOUS | Status: AC
  Administered 2022-11-15: 1000 mg via INTRAVENOUS
  Filled 2022-11-14 (×2): qty 200

## 2022-11-14 MED ORDER — HEPARIN 30,000 UNITS/1000 ML (OHS) CELLSAVER SOLUTION
Status: DC
  Filled 2022-11-14: qty 1000

## 2022-11-14 MED ORDER — POTASSIUM CHLORIDE 2 MEQ/ML IV SOLN
80.0000 meq | INTRAVENOUS | Status: DC
  Filled 2022-11-14: qty 40

## 2022-11-14 MED ORDER — DEXMEDETOMIDINE HCL IN NACL 400 MCG/100ML IV SOLN
0.1000 ug/kg/h | INTRAVENOUS | Status: AC
  Administered 2022-11-15: 41.52 ug via INTRAVENOUS
  Administered 2022-11-15: .7 ug/kg/h via INTRAVENOUS
  Administered 2022-11-15: 31.12 ug via INTRAVENOUS
  Filled 2022-11-14: qty 100

## 2022-11-14 NOTE — H&P (Signed)
[address]       [place] [address]             [phone]                                   WIRT HEMMERICH [place] Medical Record #[id number] Date of Birth: Apr 12, 1935   Sheilah Pigeon, Cordelia Poche Joaquim Nam, MD   Chief Complaint:   Increasing dizziness and severe AS   History of Present Illness:     Pt is a very pleasant 88 yo male who has been followed for AS with the most recent echo revealing an increase in gradient across his aortic valve to mean gradient of and calculated valve area of 0.5cm2 with a DI of 0.17. Pt has seen an increase in his lightheadedness (had started after a CVA in the past) to the point where he had a syncopal event 4 weeks ago and again nearly the other day. He denies CP or DOE. He has long standing afib and is on anticoagulation. He was felt due to his increase in symptoms and worsening gradients to be a candidate for AVR and has been worked up with cath and CTA (see below) to have acceptable anatomy for TAVR. Pt is anxious to proceed due to his symptoms and need to care for his wife with parkinsons             Past Medical History:  Diagnosis Date   Anticoagulated on Coumadin      followed in [facility] at [facility]   Aortic stenosis      a. Echo 2/15: Severe LVH, EF 55-60%, Gr 1 DD, mild to mod AS (mean 20 mmHg, peak 31 mmHg), AVA 1.4-1.5 cm2, mild AI, MAC, trivial MR, LA upper limits of normal, RVSP 22 mmHg, mild RAE;   b. Echo 2/16:  EF 55-60%, no RWMA, mild AS (mean 17 mmHg, peak 27 mmHg), AVA 1.5 cm2, mild AI, MAC, mild LAE   Arthritis     Bladder neoplasm     Chronic cough      NO CARDIAC OR PULMONARY RELATED   Complication of anesthesia     Coronary artery disease CARDIOLOGIST-  DR KLEIN/ ALLRED    a. s/p CABG 2004, b. LHC with patent grafts 07/2005, ejection fraction 50%;  c. 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 Amiodarone Rx   S/P CABG x 5 2004   Stroke [facility]) 03/10/2020    Stroke in the Left eye               Past Surgical History:  Procedure Laterality Date   CARDIAC CATHETERIZATION   07-26-2005  DR GAMBLE    PRESERVED LVF/  EF 50%/ PATENT GRAFTS   CARDIAC CATHETERIZATION   03-04-2003  DR GAMBLE    SEVERE 3 VESSEL DISEASE   CARDIAC CATHETERIZATION  1991  DR [facility]    PAF/ FALSE POSITIVE STRESS TEST   CAROTID ENDARTERECTOMY Left 2022   CATARACT EXTRACTION W/ INTRAOCULAR LENS IMPLANT Right     CORONARY ARTERY BYPASS GRAFT   03-08-2003  DR [id number]    LIMA TO LAD/ SVG TO OM2/  SVG TO DIAGONAL / SVG TO PDA & PLA   CYSTOSCOPY WITH BIOPSY N/A 12/25/2012    Procedure: CYSTOSCOPY WITH BLADDER BIOPSY;  Surgeon: Antony Haste, MD;  Location: [facility];  Service: Urology;  Laterality: N/A;   ELECTROPHYSIOLOGY STUDY   07-27-2005  DR Lewayne Bunting    MAPPING --   RESULT NONINDUCIBLE VT OR SVT/  DX RIGHT VENTRICULAR OUTFLOW TRACT VT AND RULES OUT MORE MALIGNANT CAUSES OF VT   ENDARTERECTOMY Left 07/08/2020    Procedure: LEFT CAROTID ENDARTERECTOMY;  Surgeon: Nada Libman, MD;  Location: [facility] OR;  Service: Vascular;  Laterality: Left;   INGUINAL HERNIA REPAIR Bilateral 1954  &  1990   KNEE ARTHROSCOPY Right 1994   LEFT HEART CATH AND CORS/GRAFTS ANGIOGRAPHY N/A 10/14/2022    Procedure: LEFT HEART CATH AND CORS/GRAFTS ANGIOGRAPHY;  Surgeon: Tonny Bollman, MD;  Location: [facility] INVASIVE CV LAB;  Service: Cardiovascular;  Laterality: N/A;   LUMBAR DISC SURGERY   1999    L4 -- L5   MOHS SURGERY        by Dr. Jeanella Craze 2019   REMOVAL VOCAL CORD POLYPS   1978    TONSILLECTOMY   AS CHILD   TRANSTHORACIC ECHOCARDIOGRAM   08/08/2011    MILD LVH/  EF 55-60%/  GRADE I DIASTOLIC DYSFUNCTION/ MILD AV STENOSIS          Tobacco Use History  Social History        Tobacco Use  Smoking Status Former   Current packs/day: 0.00   Average packs/day: 1 pack/day for 25.0 years (25.0 ttl pk-yrs)   Types: Cigarettes   Start date: 04/26/1955   Quit date: 04/25/1980   Years since quitting: 42.5  Smokeless Tobacco Never      Social History        Substance and Sexual Activity  Alcohol Use No   Alcohol/week: 0.0 standard drinks of alcohol      Social History         Socioeconomic History   Marital status: Married      Spouse name: [name]   Number of children: 3   Years of education: Not on file   Highest education level: Not on file  Occupational History   Occupation: Retired  Tobacco Use   Smoking status: Former      Current packs/day: 0.00      Average packs/day: 1 pack/day for 25.0 years (25.0 ttl pk-yrs)      Types: Cigarettes      Start date: 04/26/1955      Quit date: 04/25/1980      Years since quitting: 42.5   Smokeless tobacco: Never  Vaping Use   Vaping status: Never Used  Substance and Sexual Activity   Alcohol use: No      Alcohol/week: 0.0 standard drinks of alcohol   Drug use: No   Sexual activity: Not on file  Other Topics Concern   Not on file  Social History Narrative    [employer] '56-'58, overseas to [place]    Some care through [place]    No service disability    Social Determinants of Acupuncturist  Strain: Low Risk  (10/22/2021)    Overall Financial Resource Strain (CARDIA)     Difficulty of Paying Living Expenses: Not hard at all  Food Insecurity: No Food Insecurity (10/22/2021)    Hunger Vital Sign     Worried About Running Out of Food in the Last Year: Never true     Ran Out of Food in the Last Year: Never true  Transportation Needs: No Transportation Needs (10/22/2021)    PRAPARE - Nutritional therapist (Medical): No     Lack of Transportation (Non-Medical): No  Physical Activity: Inactive (10/22/2021)    Exercise Vital Sign     Days of Exercise per Week: 0 days     Minutes of Exercise per Session: 0 min  Stress: No Stress Concern Present (10/22/2021)    [employer] of Occupational Health - Occupational Stress Questionnaire     Feeling of Stress : Not at all  Social Connections: Unknown (09/06/2021)    Received from [facility]    Social Network     Social Network: Not on file  Intimate Partner Violence: Unknown (07/30/2021)    Received from [facility]    HITS     Physically Hurt: Not on file     Insult or Talk Down To: Not on file     Threaten Physical Harm: Not on file     Scream or Curse: Not on file      Allergies       Allergies  Allergen Reactions   Ace Inhibitors Other (See Comments)      COUGH   Zocor [Simvastatin] Other (See Comments)      MYALGIA   Nsaids Other (See Comments)      Would avoid given anticoagulation   Prednisone Other (See Comments)      Prednisone caused pt to have elevated BP.   Requip [Ropinirole]        didn't sleep well and had trouble with abnormal dreams   Cephalexin Rash   Chocolate Other (See Comments)      Headaches     Doxycycline Other (See Comments)      Nausea and abd pain   Gabapentin Other (See Comments)      Dizziness   Hycodan [Hydrocodone Bit-Homatrop Mbr] Nausea And Vomiting   Oxycodone Nausea Only      Pt states that he cannot take prescription pain medication              Current Outpatient Medications  Medication Sig Dispense Refill   acetaminophen (TYLENOL) 325 MG tablet Take 650 mg by mouth once as needed for moderate pain or headache.       apixaban (ELIQUIS) 5 MG TABS tablet Take 1 tablet (5 mg total) by mouth 2 (two) times daily. Take evening dose starting 07/09/20 180 tablet 3   butalbital-acetaminophen-caffeine (FIORICET, ESGIC) 50-325-40 MG per tablet Take 1 tablet by mouth 3  (three) times daily as needed for migraine.        cetirizine (ZYRTEC) 10 MG tablet Take 1 tablet (10 mg total) by mouth daily.       diltiazem (CARDIZEM CD) 120 MG 24 hr capsule Take 120 mg by mouth daily.       diltiazem (CARDIZEM) 30 MG tablet Take 1 tablet (30 mg total) by mouth 3 (three) times daily. Stop 120mg  diltiazem (Patient not taking: Reported on 11/09/2022) 90 tablet 1   fluticasone (FLONASE) 50 MCG/ACT nasal spray Place 1 spray into  both nostrils daily. (Patient taking differently: Place 1 spray into both nostrils daily as needed for allergies.) 16 g 1   latanoprost (XALATAN) 0.005 % ophthalmic solution Place 1 drop into both eyes at bedtime.       melatonin 3 MG TABS tablet Take 3 mg by mouth at bedtime as needed (Sleep).       Multiple Vitamins-Minerals (MULTIVITAMIN WITH MINERALS) tablet Take 1 tablet by mouth daily.       Polyethyl Glycol-Propyl Glycol (SYSTANE ULTRA OP) Place 1-2 drops into both eyes once as needed (dry eyes.).       pravastatin (PRAVACHOL) 80 MG tablet Take 1 tablet (80 mg total) by mouth every evening. 90 tablet 0      No current facility-administered medications for this visit.               Family History  Problem Relation Age of Onset   Cancer Mother     Stroke Neg Hx                  Physical Exam: Healthy appearing Teeth in good repair Lungs: clear Card: IRR with harsh systolic murmur Ext: no edema Neuro: alert and no focal deficits         Diagnostic Studies & Laboratory data: I have personally reviewed the following studies and agree with the findings   TTE (09/2022) IMPRESSIONS     1. Left ventricular ejection fraction, by estimation, is 60 to 65%. The  left ventricle has normal function. The left ventricle has no regional  wall motion abnormalities. Left ventricular diastolic parameters are  indeterminate.   2. Right ventricular systolic function is normal. The right ventricular  size is normal. There is normal pulmonary  artery systolic pressure.   3. Left atrial size was moderately dilated.   4. The mitral valve is degenerative. Moderate mitral valve regurgitation.  No evidence of mitral stenosis.   5. Tricuspid valve regurgitation is mild to moderate.   6. The aortic valve is calcified. There is moderate calcification of the  aortic valve. There is moderate thickening of the aortic valve. Aortic  valve regurgitation is moderate. Severe aortic valve stenosis. Aortic  regurgitation PHT measures 591 msec.  Aortic valve area, by VTI measures 0.54 cm. Aortic valve mean gradient  measures 44.0 mmHg. Aortic valve Vmax measures 4.33 m/s.   7. There is mild dilatation of the ascending aorta, measuring 41 mm.   8. The inferior vena cava is normal in size with greater than 50%  respiratory variability, suggesting right atrial pressure of 3 mmHg.   FINDINGS   Left Ventricle: Left ventricular ejection fraction, by estimation, is 60  to 65%. The left ventricle has normal function. The left ventricle has no  regional wall motion abnormalities. The left ventricular internal cavity  size was normal in size. There is   no left ventricular hypertrophy. Left ventricular diastolic function  could not be evaluated due to mitral annular calcification (moderate or  greater). Left ventricular diastolic parameters are indeterminate.   Right Ventricle: The right ventricular size is normal. No increase in  right ventricular wall thickness. Right ventricular systolic function is  normal. There is normal pulmonary artery systolic pressure. The tricuspid  regurgitant velocity is 2.21 m/s, and   with an assumed right atrial pressure of 3 mmHg, the estimated right  ventricular systolic pressure is 22.5 mmHg.   Left Atrium: Left atrial size was moderately dilated.   Right Atrium: Right atrial size was  normal in size.   Pericardium: There is no evidence of pericardial effusion. Presence of  epicardial fat layer.   Mitral  Valve: The mitral valve is degenerative in appearance. Moderate  mitral valve regurgitation. No evidence of mitral valve stenosis.   Tricuspid Valve: The tricuspid valve is normal in structure. Tricuspid  valve regurgitation is mild to moderate. No evidence of tricuspid  stenosis.   Aortic Valve: The aortic valve is calcified. There is moderate  calcification of the aortic valve. There is moderate thickening of the  aortic valve. There is moderate aortic valve annular calcification. Aortic  valve regurgitation is moderate. Aortic  regurgitation PHT measures 591 msec. Severe aortic stenosis is present.  Aortic valve mean gradient measures 44.0 mmHg. Aortic valve peak gradient  measures 75.0 mmHg. Aortic valve area, by VTI measures 0.54 cm.   Pulmonic Valve: The pulmonic valve was normal in structure. Pulmonic valve  regurgitation is mild. No evidence of pulmonic stenosis.   Aorta: There is mild dilatation of the ascending aorta, measuring 41 mm.   Venous: The inferior vena cava is normal in size with greater than 50%  respiratory variability, suggesting right atrial pressure of 3 mmHg.   IAS/Shunts: No atrial level shunt detected by color flow Doppler.     LEFT VENTRICLE  PLAX 2D  LVIDd:         5.20 cm   Diastology  LVIDs:         3.50 cm   LV e' medial:    7.07 cm/s  LV PW:         0.90 cm   LV E/e' medial:  15.3  LV IVS:        1.30 cm   LV e' lateral:   16.10 cm/s  LVOT diam:     2.00 cm   LV E/e' lateral: 6.7  LV SV:         48  LV SV Index:   23  LVOT Area:     3.14 cm     RIGHT VENTRICLE  RV Basal diam:  3.90 cm  RV S prime:     12.20 cm/s  TAPSE (M-mode): 1.3 cm   LEFT ATRIUM              Index        RIGHT ATRIUM           Index  LA diam:        5.00 cm  2.43 cm/m   RA Area:     19.90 cm  LA Vol (A2C):   109.0 ml 52.90 ml/m  RA Volume:   56.60 ml  27.47 ml/m  LA Vol (A4C):   74.7 ml  36.25 ml/m  LA Biplane Vol: 89.2 ml  43.29 ml/m   AORTIC VALVE  AV  Area (Vmax):    0.50 cm  AV Area (Vmean):   0.51 cm  AV Area (VTI):     0.54 cm  AV Vmax:           433.00 cm/s  AV Vmean:          311.000 cm/s  AV VTI:            0.893 m  AV Peak Grad:      75.0 mmHg  AV Mean Grad:      44.0 mmHg  LVOT Vmax:         68.70 cm/s  LVOT Vmean:        50.800  cm/s  LVOT VTI:          0.153 m  LVOT/AV VTI ratio: 0.17  AI PHT:            591 msec    AORTA  Ao Root diam: 3.20 cm  Ao Asc diam:  4.10 cm   MITRAL VALVE                TRICUSPID VALVE  MV Area (PHT):              TR Peak grad:   19.5 mmHg  MV Decel Time:              TR Vmax:        221.00 cm/s  MR Peak grad: 57.5 mmHg  MR Mean grad: 46.0 mmHg     SHUNTS  MR Vmax:      379.00 cm/s   Systemic VTI:  0.15 m  MR Vmean:     335.0 cm/s    Systemic Diam: 2.00 cm  MV E velocity: 108.50 cm/s    CATH (09/2022) Conclusion   1.  Severe native two-vessel coronary artery disease with severe left main stenosis, total occlusion of the LAD, total occlusion of the circumflex, and moderate calcified plaquing of the RCA 2.  Status post CABG with continued patency of the LIMA to LAD, saphenous vein graft to OM, saphenous vein graft to diagonal, and sequential saphenous vein graft to PDA and PLA branches 3.  Known severe aortic stenosis with direct catheter gradients of 32 mmHg mean    Recent Radiology Findings:   CTA (09/2022) FINDINGS: Aortic Valve: Calcium Score 3643   Aorta: Aneurysmal ascending aorta. Normal arch vessels Pseudo coarctation with minimal diameter 25.8 mm dilates to 33 mm distally. Moderate calcific atherosclerosis   Sinotubular Junction: 31 mm   Ascending Thoracic Aorta: 40 mm   Aortic Arch: 32 mm   Descending Thoracic Aorta: 29 mm   Sinus of Valsalva Measurements:   Non-coronary: 37.5 mm   Right - coronary: 36.2 mm   Left - coronary: 38.4 mm   Coronary Artery Height above Annulus:   Left Main: 16.4 mm above annulus   Right Coronary: 22.5 mm above annulus    Virtual Basal Annulus Measurements:   Maximum/Minimum Diameter: 31 mm x 23.6 mm Average diameter 27.3 mm   Perimeter: 88.5 mm   Area: 586 mm2   Coronary Arteries: Sufficient height above annulus for deployment   Optimum Fluoroscopic Angle for Delivery: LAO 11 Caudal 7 degrees   Membranous septal length 8.25 mm   IMPRESSION: 1. [name] leaflet AV with calcium score 3643   2.  Annular area of 586 mm 2 suitable for a 29 mm Sapien valve   3. Pseudo coarctation of the descending thoracic aorta just distal to the left subclavian take off   4.  Coronary arteries sufficient height above annulus for deployment   5. Optimum angiographic angle for deployment LAO 11 Caudal 7 degrees   6.  Membranous septal length 8.25 mm   7. Patent LIMA to LAD, Patent SVG;s to OM, D1 and sequential to PDA/PLB   [name]     Electronically Signed   By: [name] M.D.   On: 10/19/2022 14:23    Addended by Wendall Stade, MD on 10/19/2022  2:26 PM    Study Result   Narrative & Impression  EXAM: OVER-READ INTERPRETATION  CT CHEST   The following report is a limited chest CT over-read  performed by radiologist Dr. Allegra Lai of [facility] on 10/19/2022. This over-read does not include interpretation of cardiac or coronary anatomy or pathology. The cardiac TAVR interpretation by the cardiologist is attached.   COMPARISON:  None Available.   FINDINGS: Extracardiac findings will be described separately under dictation for contemporaneously obtained CTA chest, abdomen and pelvis.   IMPRESSION: Please see separate dictation for contemporaneously obtained CTA chest, abdomen and pelvis dated 10/19/2022 for full description of relevant extracardiac findings.            Recent Lab Findings: Recent Labs       Lab Results  Component Value Date    WBC 6.1 10/07/2022    HGB 13.4 10/07/2022    HCT 40.1 10/07/2022    PLT 215.0 10/07/2022    GLUCOSE 95 10/07/2022     CHOL 170 10/25/2021    TRIG 74 10/25/2021    HDL 70 10/25/2021    LDLDIRECT 88 07/27/2020    LDLCALC 85 10/25/2021    ALT 17 10/07/2022    AST 21 10/07/2022    NA 141 10/07/2022    K 4.2 10/07/2022    CL 102 10/07/2022    CREATININE 0.85 10/07/2022    BUN 14 10/07/2022    CO2 32 10/07/2022    TSH 2.32 10/07/2022    INR 1.1 07/02/2020    HGBA1C 5.9 10/27/2020            Assessment / Plan:     88 yo male with NYHA class 3 symptoms of severe AS with normal LV function and no significant obstruction sp cabg. Pt with his age and comorbidities would best be served with TAVR and he agrees and wishes to proceed. He understands the risks and goals of surgery and the recovery. He would not be a surgical bailout. He has anatomy for a 29mm Sapien via the femoral approach.

## 2022-11-15 ENCOUNTER — Inpatient Hospital Stay (HOSPITAL_COMMUNITY)
Admission: RE | Admit: 2022-11-15 | Discharge: 2022-11-17 | DRG: 267 | Disposition: A | Discharge status: Home / Self Care | Payer: Medicare Other | Attending: Cardiovascular Disease | Admitting: Cardiovascular Disease

## 2022-11-15 ENCOUNTER — Inpatient Hospital Stay (HOSPITAL_COMMUNITY): Payer: Medicare Other | Admitting: Vascular Surgery

## 2022-11-15 ENCOUNTER — Encounter (HOSPITAL_COMMUNITY): Payer: Self-pay | Admitting: Cardiovascular Disease

## 2022-11-15 ENCOUNTER — Inpatient Hospital Stay (HOSPITAL_COMMUNITY): Payer: Medicare Other

## 2022-11-15 ENCOUNTER — Encounter (HOSPITAL_COMMUNITY)
Admission: RE | Disposition: A | Discharge status: Home / Self Care | Payer: Self-pay | Source: Home / Self Care | Attending: Cardiovascular Disease

## 2022-11-15 ENCOUNTER — Inpatient Hospital Stay (HOSPITAL_COMMUNITY): Payer: Medicare Other | Admitting: Anesthesiology

## 2022-11-15 ENCOUNTER — Other Ambulatory Visit: Payer: Self-pay

## 2022-11-15 DIAGNOSIS — Z87891 Personal history of nicotine dependence: Secondary | ICD-10-CM | POA: Diagnosis not present

## 2022-11-15 DIAGNOSIS — Z952 Presence of prosthetic heart valve: Secondary | ICD-10-CM | POA: Diagnosis not present

## 2022-11-15 DIAGNOSIS — I10 Essential (primary) hypertension: Secondary | ICD-10-CM | POA: Diagnosis present

## 2022-11-15 DIAGNOSIS — I452 Bifascicular block: Secondary | ICD-10-CM | POA: Diagnosis not present

## 2022-11-15 DIAGNOSIS — Z79899 Other long term (current) drug therapy: Secondary | ICD-10-CM

## 2022-11-15 DIAGNOSIS — I4510 Unspecified right bundle-branch block: Secondary | ICD-10-CM | POA: Insufficient documentation

## 2022-11-15 DIAGNOSIS — Z8673 Personal history of transient ischemic attack (TIA), and cerebral infarction without residual deficits: Secondary | ICD-10-CM | POA: Diagnosis not present

## 2022-11-15 DIAGNOSIS — I7121 Aneurysm of the ascending aorta, without rupture: Secondary | ICD-10-CM | POA: Diagnosis not present

## 2022-11-15 DIAGNOSIS — Z91018 Allergy to other foods: Secondary | ICD-10-CM

## 2022-11-15 DIAGNOSIS — M1990 Unspecified osteoarthritis, unspecified site: Secondary | ICD-10-CM | POA: Diagnosis present

## 2022-11-15 DIAGNOSIS — G629 Polyneuropathy, unspecified: Secondary | ICD-10-CM

## 2022-11-15 DIAGNOSIS — I739 Peripheral vascular disease, unspecified: Secondary | ICD-10-CM | POA: Diagnosis present

## 2022-11-15 DIAGNOSIS — Z951 Presence of aortocoronary bypass graft: Secondary | ICD-10-CM

## 2022-11-15 DIAGNOSIS — I350 Nonrheumatic aortic (valve) stenosis: Secondary | ICD-10-CM

## 2022-11-15 DIAGNOSIS — E7800 Pure hypercholesterolemia, unspecified: Secondary | ICD-10-CM | POA: Diagnosis present

## 2022-11-15 DIAGNOSIS — Z881 Allergy status to other antibiotic agents status: Secondary | ICD-10-CM | POA: Diagnosis not present

## 2022-11-15 DIAGNOSIS — E785 Hyperlipidemia, unspecified: Secondary | ICD-10-CM | POA: Diagnosis present

## 2022-11-15 DIAGNOSIS — K219 Gastro-esophageal reflux disease without esophagitis: Secondary | ICD-10-CM | POA: Diagnosis present

## 2022-11-15 DIAGNOSIS — I060 Rheumatic aortic stenosis: Secondary | ICD-10-CM | POA: Diagnosis present

## 2022-11-15 DIAGNOSIS — Z006 Encounter for examination for normal comparison and control in clinical research program: Secondary | ICD-10-CM

## 2022-11-15 DIAGNOSIS — Z7901 Long term (current) use of anticoagulants: Secondary | ICD-10-CM

## 2022-11-15 DIAGNOSIS — Z8551 Personal history of malignant neoplasm of bladder: Secondary | ICD-10-CM

## 2022-11-15 DIAGNOSIS — I2510 Atherosclerotic heart disease of native coronary artery without angina pectoris: Secondary | ICD-10-CM | POA: Diagnosis present

## 2022-11-15 DIAGNOSIS — I4821 Permanent atrial fibrillation: Secondary | ICD-10-CM | POA: Diagnosis present

## 2022-11-15 DIAGNOSIS — Z886 Allergy status to analgesic agent status: Secondary | ICD-10-CM | POA: Diagnosis not present

## 2022-11-15 DIAGNOSIS — I442 Atrioventricular block, complete: Secondary | ICD-10-CM | POA: Diagnosis not present

## 2022-11-15 DIAGNOSIS — Z885 Allergy status to narcotic agent status: Secondary | ICD-10-CM | POA: Diagnosis not present

## 2022-11-15 HISTORY — PX: TRANSCATHETER AORTIC VALVE REPLACEMENT, TRANSFEMORAL: SHX6400

## 2022-11-15 HISTORY — DX: Permanent atrial fibrillation: I48.21

## 2022-11-15 HISTORY — DX: Presence of prosthetic heart valve: Z95.2

## 2022-11-15 HISTORY — PX: INTRAOPERATIVE TRANSTHORACIC ECHOCARDIOGRAM: SHX6523

## 2022-11-15 HISTORY — DX: Nonrheumatic aortic (valve) stenosis: I35.0

## 2022-11-15 LAB — POCT I-STAT, CHEM 8
BUN: 12 mg/dL (ref 8–23)
Calcium, Ion: 1.18 mmol/L (ref 1.15–1.40)
Chloride: 103 mmol/L (ref 98–111)
Creatinine, Ser: 0.6 mg/dL — ABNORMAL LOW (ref 0.61–1.24)
Glucose, Bld: 141 mg/dL — ABNORMAL HIGH (ref 70–99)
HCT: 30 % — ABNORMAL LOW (ref 39.0–52.0)
Hemoglobin: 10.2 g/dL — ABNORMAL LOW (ref 13.0–17.0)
Potassium: 3.3 mmol/L — ABNORMAL LOW (ref 3.5–5.1)
Sodium: 142 mmol/L (ref 135–145)
TCO2: 29 mmol/L (ref 22–32)

## 2022-11-15 LAB — ECHOCARDIOGRAM LIMITED
AR max vel: 3.33 cm2
AV Area VTI: 3.43 cm2
AV Area mean vel: 3.08 cm2
AV Mean grad: 2 mmHg
AV Peak grad: 4.2 mmHg
Ao pk vel: 1.03 m/s
P 1/2 time: 404 ms

## 2022-11-15 LAB — GLUCOSE, CAPILLARY
Glucose-Capillary: 141 mg/dL — ABNORMAL HIGH (ref 70–99)
Glucose-Capillary: 105 mg/dL — ABNORMAL HIGH (ref 70–99)

## 2022-11-15 LAB — MRSA NEXT GEN BY PCR, NASAL: MRSA by PCR Next Gen: NOT DETECTED

## 2022-11-15 LAB — POCT ACTIVATED CLOTTING TIME
Activated Clotting Time: 287 seconds
Activated Clotting Time: 122 seconds

## 2022-11-15 SURGERY — IMPLANTATION, AORTIC VALVE, TRANSCATHETER, FEMORAL APPROACH
Anesthesia: Monitor Anesthesia Care

## 2022-11-15 MED ORDER — CHLORHEXIDINE GLUCONATE 4 % EX SOLN: 60.0000 mL | Freq: Once | CUTANEOUS | Status: DC

## 2022-11-15 MED ORDER — PROPOFOL 500 MG/50ML IV EMUL
INTRAVENOUS | Status: DC | PRN
  Administered 2022-11-15: 30 ug/kg/min via INTRAVENOUS

## 2022-11-15 MED ORDER — LIDOCAINE HCL (PF) 1 % IJ SOLN
INTRAMUSCULAR | Status: DC | PRN
  Administered 2022-11-15 (×2): 12 mL
  Administered 2022-11-15: 5 mL

## 2022-11-15 MED ORDER — ACETAMINOPHEN 325 MG PO TABS: 650.0000 mg | ORAL_TABLET | Freq: Four times a day (QID) | ORAL | Status: DC | PRN

## 2022-11-15 MED ORDER — IOHEXOL 350 MG/ML SOLN
INTRAVENOUS | Status: DC | PRN
  Administered 2022-11-15: 48 mL via INTRA_ARTERIAL

## 2022-11-15 MED ORDER — ACETAMINOPHEN 650 MG RE SUPP: 650.0000 mg | Freq: Four times a day (QID) | RECTAL | Status: DC | PRN

## 2022-11-15 MED ORDER — PRAVASTATIN SODIUM 40 MG PO TABS
80.0000 mg | ORAL_TABLET | Freq: Every evening | ORAL | Status: DC
  Administered 2022-11-15 – 2022-11-16 (×2): 80 mg via ORAL
  Filled 2022-11-15 (×2): qty 2

## 2022-11-15 MED ORDER — SODIUM CHLORIDE 0.9 % IV SOLN: 250.0000 mL | INTRAVENOUS | Status: DC | PRN

## 2022-11-15 MED ORDER — POTASSIUM CHLORIDE ER 10 MEQ PO TBCR
40.0000 meq | EXTENDED_RELEASE_TABLET | Freq: Once | ORAL | Status: AC
  Administered 2022-11-15: 40 meq via ORAL
  Filled 2022-11-15 (×2): qty 4

## 2022-11-15 MED ORDER — SODIUM CHLORIDE 0.9% FLUSH
3.0000 mL | Freq: Two times a day (BID) | INTRAVENOUS | Status: DC
  Administered 2022-11-16 – 2022-11-17 (×3): 3 mL via INTRAVENOUS

## 2022-11-15 MED ORDER — VANCOMYCIN HCL IN DEXTROSE 1-5 GM/200ML-% IV SOLN
1000.0000 mg | Freq: Once | INTRAVENOUS | Status: AC
  Administered 2022-11-15: 1000 mg via INTRAVENOUS
  Filled 2022-11-15: qty 200

## 2022-11-15 MED ORDER — HEPARIN SODIUM (PORCINE) 1000 UNIT/ML IJ SOLN
INTRAMUSCULAR | Status: DC | PRN
  Administered 2022-11-15: 13000 [IU] via INTRAVENOUS

## 2022-11-15 MED ORDER — OXIDIZED CELLULOSE EX PADS
1.0000 | MEDICATED_PAD | CUTANEOUS | Status: AC
  Administered 2022-11-15: 1 via TOPICAL
  Filled 2022-11-15: qty 1

## 2022-11-15 MED ORDER — PROTAMINE SULFATE 10 MG/ML IV SOLN
INTRAVENOUS | Status: DC | PRN
  Administered 2022-11-15: 130 mg via INTRAVENOUS

## 2022-11-15 MED ORDER — ACETAMINOPHEN 160 MG/5ML PO SOLN: 1000.0000 mg | Freq: Once | ORAL | Status: DC | PRN

## 2022-11-15 MED ORDER — LACTATED RINGERS IV SOLN: INTRAVENOUS | Status: DC

## 2022-11-15 MED ORDER — HEPARIN (PORCINE) IN NACL 1000-0.9 UT/500ML-% IV SOLN
INTRAVENOUS | Status: DC | PRN
  Administered 2022-11-15 (×2): 500 mL

## 2022-11-15 MED ORDER — POTASSIUM CHLORIDE ER 10 MEQ PO TBCR
40.0000 meq | EXTENDED_RELEASE_TABLET | Freq: Once | ORAL | Status: AC
  Administered 2022-11-16: 40 meq via ORAL
  Filled 2022-11-15 (×2): qty 4

## 2022-11-15 MED ORDER — ONDANSETRON HCL 4 MG/2ML IJ SOLN
4.0000 mg | Freq: Four times a day (QID) | INTRAMUSCULAR | Status: DC | PRN
  Administered 2022-11-15 – 2022-11-16 (×2): 4 mg via INTRAVENOUS
  Filled 2022-11-15 (×2): qty 2

## 2022-11-15 MED ORDER — ACETAMINOPHEN 10 MG/ML IV SOLN: 1000.0000 mg | Freq: Once | INTRAVENOUS | Status: DC | PRN

## 2022-11-15 MED ORDER — CHLORHEXIDINE GLUCONATE 4 % EX SOLN: 30.0000 mL | CUTANEOUS | Status: DC

## 2022-11-15 MED ORDER — TRAMADOL HCL 50 MG PO TABS
50.0000 mg | ORAL_TABLET | ORAL | Status: DC | PRN
  Administered 2022-11-15 – 2022-11-17 (×3): 100 mg via ORAL
  Filled 2022-11-15 (×3): qty 2

## 2022-11-15 MED ORDER — NITROGLYCERIN IN D5W 200-5 MCG/ML-% IV SOLN
0.0000 ug/min | INTRAVENOUS | Status: DC
  Administered 2022-11-15: 5 ug/min via INTRAVENOUS
  Filled 2022-11-15: qty 250

## 2022-11-15 MED ORDER — CHLORHEXIDINE GLUCONATE CLOTH 2 % EX PADS
6.0000 | MEDICATED_PAD | Freq: Every day | CUTANEOUS | Status: DC
  Administered 2022-11-16 – 2022-11-17 (×2): 6 via TOPICAL

## 2022-11-15 MED ORDER — SODIUM CHLORIDE 0.9 % IV SOLN: INTRAVENOUS | Status: DC

## 2022-11-15 MED ORDER — ACETAMINOPHEN 500 MG PO TABS: 1000.0000 mg | ORAL_TABLET | Freq: Once | ORAL | Status: DC | PRN

## 2022-11-15 MED ORDER — SODIUM CHLORIDE 0.9 % IV SOLN: INTRAVENOUS | Status: AC

## 2022-11-15 MED ORDER — SODIUM CHLORIDE 0.9% FLUSH: 3.0000 mL | INTRAVENOUS | Status: DC | PRN

## 2022-11-15 MED ORDER — MELATONIN 3 MG PO TABS: 3.0000 mg | ORAL_TABLET | Freq: Every evening | ORAL | Status: DC | PRN

## 2022-11-15 MED ORDER — CHLORHEXIDINE GLUCONATE 0.12 % MT SOLN
15.0000 mL | Freq: Once | OROMUCOSAL | Status: AC
  Administered 2022-11-15: 15 mL via OROMUCOSAL
  Filled 2022-11-15: qty 15

## 2022-11-15 MED ORDER — LIDOCAINE HCL 1 % IJ SOLN
INTRAMUSCULAR | Status: AC
  Filled 2022-11-15: qty 20

## 2022-11-15 MED ORDER — FENTANYL CITRATE (PF) 100 MCG/2ML IJ SOLN: 25.0000 ug | INTRAMUSCULAR | Status: DC | PRN

## 2022-11-15 MED ORDER — MORPHINE SULFATE (PF) 2 MG/ML IV SOLN
1.0000 mg | INTRAVENOUS | Status: DC | PRN
  Administered 2022-11-15: 2 mg via INTRAVENOUS
  Filled 2022-11-15: qty 1

## 2022-11-15 SURGICAL SUPPLY — 33 items
BAG SNAP BAND KOVER 36X36 (MISCELLANEOUS) ×2 IMPLANT
CABLE ADAPT PACING TEMP 12FT (ADAPTER) IMPLANT
CATH COMMANDER DELIVERY SYS 29 (CATHETERS) IMPLANT
CATH DIAG 6FR PIGTAIL ANGLED (CATHETERS) IMPLANT
CATH INFINITI 5FR ANG PIGTAIL (CATHETERS) IMPLANT
CATH INFINITI 6F AL2 (CATHETERS) IMPLANT
CATH-GARD ARROW CATH SHIELD (MISCELLANEOUS) ×1
CLOSURE MYNX CONTROL 6F/7F (Vascular Products) IMPLANT
CLOSURE PERCLOSE PROSTYLE (VASCULAR PRODUCTS) IMPLANT
CRIMPER (MISCELLANEOUS) IMPLANT
DEVICE INFLATION ATRION QL38 (MISCELLANEOUS) IMPLANT
INTRODUCER 7FR 23CM (INTRODUCER) IMPLANT
KIT MICROPUNCTURE NIT STIFF (SHEATH) IMPLANT
KIT SAPIAN 3 ULTRA RESILIA 29 (Valve) IMPLANT
KIT SINGLE USE MANIFOLD (KITS) IMPLANT
PACK CARDIAC CATHETERIZATION (CUSTOM PROCEDURE TRAY) ×1 IMPLANT
PROTECTION STATION PRESSURIZED (MISCELLANEOUS) ×1
SET ATX-X65L (MISCELLANEOUS) IMPLANT
SHEATH INTRODUCER SET 29 (SHEATH) IMPLANT
SHEATH PINNACLE 6F 10CM (SHEATH) IMPLANT
SHEATH PINNACLE 8F 10CM (SHEATH) IMPLANT
SHEATH PROBE COVER 6X72 (BAG) IMPLANT
SHIELD CATHGARD ARROW (MISCELLANEOUS) IMPLANT
STATION PROTECTION PRESSURIZED (MISCELLANEOUS) IMPLANT
STOPCOCK MORSE 400PSI 3WAY (MISCELLANEOUS) ×2 IMPLANT
TRANSDUCER W/STOPCOCK (MISCELLANEOUS) ×2 IMPLANT
TUBING ART PRESS 72 MALE/FEM (TUBING) IMPLANT
WIRE AMPLATZ SS-J .035X180CM (WIRE) IMPLANT
WIRE EMERALD 3MM-J .035X150CM (WIRE) IMPLANT
WIRE EMERALD 3MM-J .035X260CM (WIRE) IMPLANT
WIRE EMERALD ST .035X260CM (WIRE) IMPLANT
WIRE PACING TEMP ST TIP 5 (CATHETERS) IMPLANT
WIRE SAFARI SM CURVE 275 (WIRE) IMPLANT

## 2022-11-15 NOTE — Progress Notes (Signed)
  [facility]   MULTIDISCIPLINARY HEART VALVE TEAM  Patient doing well s/p TAVR. He is hemodynamically stable. Groin sites stable. ECG not completed. Tele shows a paced rhythm. When temp pacer is turned down, he has a very slow escape rhythm in the 30- low 40s. Will consult EP.    Cline Crock PA-C  [facility]  Pager [phone]

## 2022-11-15 NOTE — Interval H&P Note (Signed)
History and Physical Interval Note:  11/15/2022 10:09 AM  [name] [name]  has presented today for surgery, with the diagnosis of Severe Aortic Stenosis.  The various methods of treatment have been discussed with the patient and family. After consideration of risks, benefits and other options for treatment, the patient has consented to  Procedure(s): Transcatheter Aortic Valve Replacement, Transfemoral (N/A) INTRAOPERATIVE TRANSTHORACIC ECHOCARDIOGRAM (N/A) as a surgical intervention.  The patient's history has been reviewed, patient examined, no change in status, stable for surgery.  I have reviewed the patient's chart and labs.  Questions were answered to the patient's satisfaction.     Eugenio Hoes

## 2022-11-15 NOTE — Op Note (Signed)
[facility]   MULTIDISCIPLINARY HEART VALVE TEAM   TAVR OPERATIVE NOTE   Date of Procedure:  11/15/2022  Preoperative Diagnosis: Severe Aortic Stenosis   Postoperative Diagnosis: Same   Procedure:   Transcatheter Aortic Valve Replacement - Percutaneous Transfemoral Approach  Edwards Sapien 3 Ultra Resilia THV (size 29 mm, serial # [id number] )   Co-Surgeons:  Eugenio Hoes, MD and Tonny Bollman, MD  Anesthesiologist:  Corky Sox, MD  Echocardiographer:  Thurmon Fair, MD  Pre-operative Echo Findings: Severe aortic stenosis Normal left ventricular systolic function  Post-operative Echo Findings: Trace paravalvular leak Normal/unchanged left ventricular systolic function  BRIEF CLINICAL NOTE AND INDICATIONS FOR SURGERY  88 year old male with progressive symptoms of severe aortic stenosis and normal LV function with history of CABG and patent bypass grafts.  After he underwent multidisciplinary heart team review of his case, as well as appropriate preoperative imaging studies, he presents today for TAVR.  During the course of the patient's preoperative work up they have been evaluated comprehensively by a multidisciplinary team of specialists coordinated through the Multidisciplinary Heart Valve Clinic in the [facility].  They have been demonstrated to suffer from symptomatic severe aortic stenosis as noted above. The patient has been counseled extensively as to the relative risks and benefits of all options for the treatment of severe aortic stenosis including long term medical therapy, conventional surgery for aortic valve replacement, and transcatheter aortic valve replacement.  The patient has been independently evaluated in formal cardiac surgical consultation by Dr Leafy Ro, who deemed the patient appropriate for TAVR. Based upon review of all of the patient's preoperative diagnostic tests they are felt to be candidate for transcatheter  aortic valve replacement using the transfemoral approach as an alternative to conventional surgery.    Following the decision to proceed with transcatheter aortic valve replacement, a discussion has been held regarding what types of management strategies would be attempted intraoperatively in the event of life-threatening complications, including whether or not the patient would be considered a candidate for the use of cardiopulmonary bypass and/or conversion to open sternotomy for attempted surgical intervention.  The patient has been advised of a variety of complications that might develop peculiar to this approach including but not limited to risks of death, stroke, paravalvular leak, aortic dissection or other major vascular complications, aortic annulus rupture, device embolization, cardiac rupture or perforation, acute myocardial infarction, arrhythmia, heart block or bradycardia requiring permanent pacemaker placement, congestive heart failure, respiratory failure, renal failure, pneumonia, infection, other late complications related to structural valve deterioration or migration, or other complications that might ultimately cause a temporary or permanent loss of functional independence or other long term morbidity.  The patient provides full informed consent for the procedure as described and all questions were answered preoperatively.  DETAILS OF THE OPERATIVE PROCEDURE  PREPARATION:   The patient is brought to the operating room on the above mentioned date and central monitoring was established by the anesthesia team including placement of a radial arterial line. The patient is placed in the supine position on the operating table.  Intravenous antibiotics are administered. The patient is monitored closely throughout the procedure under conscious sedation.  Baseline transthoracic echocardiogram is performed. The patient's chest, abdomen, both groins, and both lower extremities are prepared and  draped in a sterile manner. A time out procedure is performed.   PERIPHERAL ACCESS:   Using ultrasound guidance, femoral arterial access is obtained with placement of a 6 Fr sheaths on the  left side.  [place] images are digitally captured and stored in the patient's chart. A pigtail diagnostic catheter was passed through the femoral arterial sheath under fluoroscopic guidance into the aortic root.  For temporary pacemaker placement, the right internal jugular vein is used for access.  Using direct ultrasound guidance, a micropuncture technique is used and the right internal jugular vein is punctured followed by placement of a 6 French sheath.  A temporary transvenous pacemaker catheter was passed through the femoral venous sheath under fluoroscopic guidance into the right ventricle.  The pacemaker was tested to ensure stable lead placement and pacemaker capture. Aortic root angiography was performed in order to determine the optimal angiographic angle for valve deployment.  TRANSFEMORAL ACCESS:  A micropuncture technique is used to access the right femoral artery under fluoroscopic and ultrasound guidance.  2 Perclose devices are deployed at 10' and 2' positions to 'PreClose' the femoral artery. An 8 French sheath is placed and then an Amplatz Superstiff wire is advanced through the sheath. This is changed out for a 16 French transfemoral E-Sheath after progressively dilating over the Superstiff wire.  An AL-2 catheter was used to direct a straight-tip exchange length wire across the native aortic valve into the left ventricle. This was exchanged out for a pigtail catheter and position was confirmed in the LV apex. Simultaneous LV and Ao pressures were recorded.  The pigtail catheter was exchanged for a Safari wire in the LV apex.    BALLOON AORTIC VALVULOPLASTY:  Not performed  TRANSCATHETER HEART VALVE DEPLOYMENT:  An Edwards Sapien 3 Ultra Resilia transcatheter heart valve (size 29 mm) was prepared and  crimped per manufacturer's guidelines, and the proper orientation of the valve is confirmed on the [employer] delivery system. The valve was advanced through the introducer sheath using normal technique until in an appropriate position in the abdominal aorta beyond the sheath tip. The balloon was then retracted and using the fine-tuning wheel was centered on the valve. The valve was then advanced across the aortic arch using appropriate flexion of the catheter. The valve was carefully positioned across the aortic valve annulus. The Commander catheter was retracted using normal technique. Once final position of the valve has been confirmed by angiographic assessment, the valve is deployed while temporarily holding ventilation and during rapid ventricular pacing to maintain systolic blood pressure < 50 mmHg and pulse pressure < 10 mmHg. The balloon inflation is held for >3 seconds after reaching full deployment volume. Once the balloon has fully deflated the balloon is retracted into the ascending aorta and valve function is assessed using echocardiography. The patient's hemodynamic recovery following valve deployment is good.  The deployment balloon and guidewire are both removed. Echo demostrated acceptable post-procedural gradients, stable mitral valve function, and trace aortic insufficiency.    PROCEDURE COMPLETION:  The sheath was removed and femoral artery closure is performed using the 2 previously deployed Perclose devices.  Protamine is administered once femoral arterial repair was complete. The site is clear with no evidence of bleeding or hematoma after the sutures are tightened. The pigtail catheters are removed. Mynx closure is used for contralateral femoral arterial hemostasis for the 6 Fr sheath.  The right internal jugular venous sheath and temporary pacing catheter are secured in place at the completion of the procedure.  The patient tolerated the procedure well and is transported to the  recovery area in stable condition. There were no immediate intraoperative complications. All sponge instrument and needle counts are verified correct at completion  of the operation.   The patient received a total of 48 mL of intravenous contrast during the procedure.  EBL: minimal  LVEDP: 12 mmHg   Tonny Bollman, MD 11/15/2022 12:48 PM

## 2022-11-15 NOTE — Anesthesia Procedure Notes (Signed)
Procedure Name: MAC Date/Time: 11/15/2022 10:52 AM  Performed by: Randon Goldsmith, CRNAPre-anesthesia Checklist: Patient identified, Emergency Drugs available, Suction available and Patient being monitored Patient Re-evaluated:Patient Re-evaluated prior to induction Oxygen Delivery Method: Simple face mask Preoxygenation: Pre-oxygenation with 100% oxygen

## 2022-11-15 NOTE — Progress Notes (Signed)
  Echocardiogram 2D Echocardiogram has been performed.  [name] [name] 11/15/2022, 12:55 PM

## 2022-11-15 NOTE — Op Note (Signed)
[facility]   MULTIDISCIPLINARY HEART VALVE TEAM   TAVR OPERATIVE NOTE   Date of Procedure:  11/15/2022  Preoperative Diagnosis: Severe Aortic Stenosis   Postoperative Diagnosis: Same   Procedure:   Transcatheter Aortic Valve Replacement - Percutaneous right Transfemoral Approach  Edwards Sapien 3 Ultra THV (size 29 mm, model # [id number], )   Co-Surgeons:  Eugenio Hoes MD and Tonny Bollman, MD   Anesthesiologist:  Corky Sox MD  Echocardiographer:  Thurmon Fair MD  Pre-operative Echo Findings: Severe aortic stenosis normal left ventricular systolic function  Post-operative Echo Findings: trivial paravalvular leak normal left ventricular systolic function   BRIEF CLINICAL NOTE AND INDICATIONS FOR SURGERY   88 yo male with NYHA class 3 symptoms of severe AS with normal LV function and no significant obstruction sp cabg. Pt with his age and comorbidities would best be served with TAVR      DETAILS OF THE OPERATIVE PROCEDURE  PREPARATION:    The patient was brought to the operating room on the above mentioned date and appropriate monitoring was established by the anesthesia team. The patient was placed in the supine position on the operating table.  Intravenous antibiotics were administered. The patient was monitored closely throughout the procedure under conscious sedation.   Baseline transthoracic echocardiogram was performed. The patient's abdomen and both groins were prepped and draped in a sterile manner. A time out procedure was performed.   PERIPHERAL ACCESS:    Using the modified Seldinger technique, femoral arterial  access was obtained with placement of 6 Fr sheath on the left side.  A pigtail diagnostic catheter was passed through the left arterial sheath under fluoroscopic guidance into the aortic root.  Vnous access was obtained via the Right internal jugular approach and confirmed by flouroscompy. A temporary transvenous pacemaker catheter  was passed through the rightjugular venous sheath under fluoroscopic guidance into the right ventricle.  The pacemaker was tested to ensure stable lead placement and pacemaker capture. Aortic root angiography was performed in order to determine the optimal angiographic angle for valve deployment.   TRANSFEMORAL ACCESS:   Percutaneous transfemoral access and sheath placement was performed using ultrasound guidance.  The right common femoral artery was cannulated using a micropuncture needle and appropriate location was verified using hand injection angiogram.  A pair of Abbott Perclose percutaneous closure devices were placed and a 6 French sheath replaced into the femoral artery.  The patient was heparinized systemically and ACT verified > 250 seconds.    A 16 Fr transfemoral E-sheath was introduced into the right common femoral artery after progressively dilating over an Amplatz superstiff wire. An AL2 catheter was used to direct a straight-tip exchange length wire across the native aortic valve into the left ventricle. This was exchanged out for a pigtail catheter and position was confirmed in the LV apex. Simultaneous LV and Ao pressures were recorded.  LVEDP was 12 mmHg. The pigtail catheter was exchanged for a Safari wire in the LV apex.   BALLOON AORTIC VALVULOPLASTY:   Was not performed   TRANSCATHETER HEART VALVE DEPLOYMENT:   An Edwards Sapien 3 Ultra transcatheter heart valve (size 29 mm) was prepared and crimped per manufacturer's guidelines, and the proper orientation of the valve is confirmed on the [employer] delivery system. The valve was advanced through the introducer sheath using normal technique until in an appropriate position in the abdominal aorta beyond the sheath tip. The balloon was then retracted and using the fine-tuning wheel was centered on  the valve. The valve was then advanced across the aortic arch using appropriate flexion of the catheter. The valve was  carefully positioned across the aortic valve annulus. The Commander catheter was retracted using normal technique. Once final position of the valve has been confirmed by angiographic assessment, the valve is deployed during rapid ventricular pacing to maintain systolic blood pressure < 50 mmHg and pulse pressure < 10 mmHg. The balloon inflation is held for >3 seconds after reaching full deployment volume. Once the balloon has fully deflated the balloon is retracted into the ascending aorta and valve function is assessed using echocardiography. There is felt to be trivial paravalvular leak and no central aortic insufficiency.  The patient's hemodynamic recovery following valve deployment is good.  The deployment balloon and guidewire are both removed.    PROCEDURE COMPLETION:   The sheath was removed and femoral artery closure performed.  Protamine was administered once femoral arterial repair was complete. The temporary pacemaker, remained for back up pacing until we are assured will not need permanent pacer.   A Mynx femoral closure device was utilized following removal of the diagnostic sheath in the left femoral artery.  The patient tolerated the procedure well and is transported to the cath lab recovery area in stable condition. There were no immediate intraoperative complications. All sponge instrument and needle counts are verified correct at completion of the operation.   No blood products were administered during the operation.  The patient received a total of 48 mL of intravenous contrast during the procedure.   Eugenio Hoes, MD 11/15/2022 11:53 AM

## 2022-11-15 NOTE — Anesthesia Preprocedure Evaluation (Signed)
Anesthesia Evaluation  Patient identified by MRN, date of birth, ID band Patient awake    Reviewed: Allergy & Precautions, NPO status , Patient's Chart, lab work & pertinent test results  History of Anesthesia Complications (+) PONV and history of anesthetic complications  Airway Mallampati: III  TM Distance: >3 FB Neck ROM: Full    Dental  (+) Teeth Intact, Dental Advisory Given   Pulmonary former smoker   breath sounds clear to auscultation       Cardiovascular hypertension, (-) angina + CAD and + CABG  + Valvular Problems/Murmurs AS  Rhythm:Regular + Systolic murmurs 1. Left ventricular ejection fraction, by estimation, is 60 to 65%. The  left ventricle has normal function. The left ventricle has no regional  wall motion abnormalities. Left ventricular diastolic parameters are  indeterminate.   2. Right ventricular systolic function is normal. The right ventricular  size is normal. There is normal pulmonary artery systolic pressure.   3. Left atrial size was moderately dilated.   4. The mitral valve is degenerative. Moderate mitral valve regurgitation.  No evidence of mitral stenosis.   5. Tricuspid valve regurgitation is mild to moderate.   6. The aortic valve is calcified. There is moderate calcification of the  aortic valve. There is moderate thickening of the aortic valve. Aortic  valve regurgitation is moderate. Severe aortic valve stenosis. Aortic  regurgitation PHT measures 591 msec.  Aortic valve area, by VTI measures 0.54 cm. Aortic valve mean gradient  measures 44.0 mmHg. Aortic valve Vmax measures 4.33 m/s.   7. There is mild dilatation of the ascending aorta, measuring 41 mm.   8. The inferior vena cava is normal in size with greater than 50%  respiratory variability, suggesting right atrial pressure of 3 mmHg.     1.  Severe native two-vessel coronary artery disease with severe left main stenosis, total  occlusion of the LAD, total occlusion of the circumflex, and moderate calcified plaquing of the RCA 2.  Status post CABG with continued patency of the LIMA to LAD, saphenous vein graft to OM, saphenous vein graft to diagonal, and sequential saphenous vein graft to PDA and PLA branches 3.  Known severe aortic stenosis with direct catheter gradients of 32 mmHg mean       Neuro/Psych  Headaches PSYCHIATRIC DISORDERS Anxiety      Neuromuscular disease CVA, No Residual Symptoms    GI/Hepatic Neg liver ROS,GERD  ,,  Endo/Other  negative endocrine ROS  Lab Results      Component                Value               Date                      HGBA1C                   5.9                 10/27/2020             Renal/GU negative Renal ROSLab Results      Component                Value               Date                      CREATININE  0.79                11/11/2022                Musculoskeletal  (+) Arthritis ,    Abdominal   Peds  Hematology  (+) Blood dyscrasia Lab Results      Component                Value               Date                      WBC                      5.6                 11/11/2022                HGB                      13.7                11/11/2022                HCT                      41.8                11/11/2022                MCV                      97.4                11/11/2022                PLT                      173                 11/11/2022             eliquis   Anesthesia Other Findings   Reproductive/Obstetrics                             Anesthesia Physical Anesthesia Plan  ASA: 4  Anesthesia Plan: MAC   Post-op Pain Management: Minimal or no pain anticipated   Induction: Intravenous  PONV Risk Score and Plan: 2 and Propofol infusion and Treatment may vary due to age or medical condition  Airway Management Planned: Nasal Cannula, Natural Airway and Simple Face Mask  Additional  Equipment:   Intra-op Plan:   Post-operative Plan:   Informed Consent: I have reviewed the patients History and Physical, chart, labs and discussed the procedure including the risks, benefits and alternatives for the proposed anesthesia with the patient or authorized representative who has indicated his/her understanding and acceptance.     Dental advisory given  Plan Discussed with: CRNA  Anesthesia Plan Comments:        Anesthesia Quick Evaluation

## 2022-11-15 NOTE — Transfer of Care (Signed)
Immediate Anesthesia Transfer of Care Note  Patient: [name] [name]  Procedure(s) Performed: Transcatheter Aortic Valve Replacement, Transfemoral INTRAOPERATIVE TRANSTHORACIC ECHOCARDIOGRAM  Patient Location: ICU  Anesthesia Type:MAC  Level of Consciousness: awake, alert , and oriented  Airway & Oxygen Therapy: Patient Spontanous Breathing and Patient connected to face mask oxygen  Post-op Assessment: Report given to RN and Post -op Vital signs reviewed and stable  Post vital signs: Reviewed and stable  Last Vitals:  Vitals Value Taken Time  BP    Temp    Pulse 70   Resp 16   SpO2 99     Last Pain:  Vitals:   11/15/22 0724  TempSrc: Oral  PainSc: 0-No pain         Complications: There were no known notable events for this encounter.

## 2022-11-15 NOTE — Discharge Summary (Incomplete)
[facility]   MULTIDISCIPLINARY HEART VALVE TEAM  Discharge Summary    Patient ID: [name] [name] MRN: [id number]; DOB: January 13, 1935  Admit date: 11/15/2022 Discharge date: 11/17/2022  Primary Care Provider: Joaquim Nam, MD  Primary Cardiologist: [name], MD / Dr. Excell Seltzer & Dr. Leafy Ro (TAVR)  Discharge Diagnoses    Principal Problem:   S/P TAVR (transcatheter aortic valve replacement) Active Problems:   HTN (hypertension)   Hyperlipidemia   GERD (gastroesophageal reflux disease)   Severe aortic stenosis   Neuropathy   Permanent atrial fibrillation (HCC)   S/P CABG x 5   RBBB   Allergies Allergies  Allergen Reactions   Ace Inhibitors Other (See Comments)    COUGH   Zocor [Simvastatin] Other (See Comments)    MYALGIA   Nsaids Other (See Comments)    Would avoid given anticoagulation   Prednisone Other (See Comments)    Prednisone caused pt to have elevated BP.   Requip [Ropinirole]     didn't sleep well and had trouble with abnormal dreams   Cephalexin Rash   Chocolate Other (See Comments)    Headaches    Doxycycline Other (See Comments)    Nausea and abd pain   Gabapentin Other (See Comments)    Dizziness   Hycodan [Hydrocodone Bit-Homatrop Mbr] Nausea And Vomiting   Oxycodone Nausea Only    Pt states that he cannot take prescription pain medication    Diagnostic Studies/Procedures    TAVR OPERATIVE NOTE     Date of Procedure:                11/15/2022   Preoperative Diagnosis:      Severe Aortic Stenosis    Postoperative Diagnosis:    Same    Procedure:        Transcatheter Aortic Valve Replacement - Percutaneous Transfemoral Approach             Edwards Sapien 3 Ultra Resilia THV (size 29 mm, serial # [id number] )              Co-Surgeons:                        Eugenio Hoes, MD and Tonny Bollman, MD   Anesthesiologist:                  Corky Sox, MD   Echocardiographer:              Thurmon Fair, MD    Pre-operative Echo Findings: Severe aortic stenosis Normal left ventricular systolic function   Post-operative Echo Findings: Trace paravalvular leak Normal/unchanged left ventricular systolic function   _____________    Echo 11/16/22:  IMPRESSIONS   1. Left ventricular ejection fraction, by estimation, is 60 to 65%. The  left ventricle has normal function. The left ventricle has no regional  wall motion abnormalities. There is mild concentric left ventricular  hypertrophy. Left ventricular diastolic  function could not be evaluated.   2. Right ventricular systolic function is low normal. The right  ventricular size is normal. There is mildly elevated pulmonary artery  systolic pressure. The estimated right ventricular systolic pressure is  38.8 mmHg.   3. Left atrial size was severely dilated.   4. Right atrial size was moderately dilated.   5. The mitral valve is degenerative. Mild mitral valve regurgitation. No  evidence of mitral stenosis. Moderate mitral annular calcification.   6. Tricuspid valve regurgitation is mild to  moderate.   7. The aortic valve has been repaired/replaced. Aortic valve  regurgitation is not visualized. There is a 29 mm Edwards Sapien  prosthetic (TAVR) valve present in the aortic position. Procedure Date:  11/15/22. Echo findings are consistent with normal  structure and function of the aortic valve prosthesis. Aortic valve mean  gradient measures 5.0 mmHg. Aortic valve Vmax measures 1.61 m/s. Aortic  valve acceleration time measures 74 msec.   8. There is moderate dilatation of the ascending aorta, measuring 45 mm.   9. The inferior vena cava is dilated in size with <50% respiratory  variability, suggesting right atrial pressure of 15 mmHg.   History of Present Illness     [name] [name] is a 88 y.o. male with a history of CAD s/p CABG (2004 EBG), HTN, ventricular tachycardia, permanent atrial fibrillation on Eliquis, stroke and severe  aortic stenosis with recent syncope who presented to [facility] on [facility]15[facility]22 for planned TAVR.   He had an occipital stroke in 2023 and developed dizziness since that time. Echo showed moderate to severe AS and he was referred to Dr. Excell Seltzer for consideration of TAVR. He felt his dizziness was more related to his recent CVA and wanted to continue surveillance of aortic stenosis. Repeat echo on 09/26/22 showed EF 60% with progression of aortic stenosis to severe with mean grad 44 mmHg, AVA 0.54 cm2 as well as mod MR & AI and mild-mod TR. He was seen back by Dr. Excell Seltzer and reported a recent syncopal event and TAVR work up was initiated. [facility] 10/14/22 showed severe native CAD with patent bypass grafts.  He was evaluated by the multidisciplinary valve team and felt to have severe, symptomatic aortic stenosis and to be a suitable candidate for TAVR, which was set up for 11/15/22.   Hospital Course     Consultants: none   Severe AS: s/p successful TAVR with a 29 mm Edwards Sapien 3 Ultra Resilia THV via the TF approach on 11/15/22. Post operative showed EF 60%, normally functioning TAVR with a mean gradient of 5 mmHg and no PVL. Plan for discharge home today with close follow up in the outpatient setting.    Transient CHB: pt had an underlying RBBB preop. He developed CHB and was pacer dependant post valve deployment. He then returned to atrial fibrillation with new LBBB,  HRs in 90s. Pt was seen by EP and offered PPM placement, but the patient wishes to hold off for now. Will hold home Cardizem and place a Zio AT prior to discharge.    Dizziness: this has been ongoing since an occipital stroke last year. He has had worsening of this associated with nausea while admitted. Treated with Meclizine and Zofran   Persistent atrial fibrillation: rate in 90s. Home Cardizem held given bradycardia. Treated with heparin gtt while admitted. Resume Eliquis now.  HTN: BP elevated while holding home Cardizem CD. Started on Norvasc  10mg  daily.  Possible LAA thrombus: pre TAVR CT scans questioned a possible LAA thrombus. Eliquis was only held for 2 days pre op. Treated with IV heparin during admission. Resume Eliquis at discharge.   CAD s/p CABG: pre TAVR cath showed patent bypass grafts. Continue medical therapy.    TAA (Ascending thoracic aortic aneurysm): pre TAVR CT scans showed a dilated ascending thoracic aorta, measuring up to 4.2 cm. Recommend annual imaging followup by CTA or MRA.    Elevated Raise Score: 3 (age and RBBB). Also has a history of polyneuropathy. Will discuss and do  workup for cardiac amyloid in the outpatient setting.  Discharge Vitals Blood pressure (!) 132/107, pulse 81, temperature (!) 97 F (36.1 C), temperature source Axillary, resp. rate 14, height 5\' 10"  (1.778 m), weight 81.7 kg, SpO2 91%.  Filed Weights   11/15/22 0724 11/16/22 0455 11/17/22 0500  Weight: 83 kg 80.6 kg 81.7 kg    Labs & Radiologic Studies    CBC Recent Labs    11/16/22 0414 11/17/22 0054  WBC 8.6 8.2  HGB 12.0* 11.7*  HCT 35.1* 35.2*  MCV 94.9 95.9  PLT 120* 103*   Basic Metabolic Panel Recent Labs    08/65/78 0414 11/17/22 0054  NA 137 134*  K 4.0 4.6  CL 102 101  CO2 28 27  GLUCOSE 113* 98  BUN 14 16  CREATININE 0.75 0.97  CALCIUM 8.7* 8.5*  MG 2.1  --    Liver Function Tests No results for input(s): "AST", "ALT", "ALKPHOS", "BILITOT", "PROT", "ALBUMIN" in the last 72 hours. No results for input(s): "LIPASE", "AMYLASE" in the last 72 hours. Cardiac Enzymes No results for input(s): "CKTOTAL", "CKMB", "CKMBINDEX", "TROPONINI" in the last 72 hours. BNP Invalid input(s): "POCBNP" D-Dimer No results for input(s): "DDIMER" in the last 72 hours. Hemoglobin A1C No results for input(s): "HGBA1C" in the last 72 hours. Fasting Lipid Panel No results for input(s): "CHOL", "HDL", "LDLCALC", "TRIG", "CHOLHDL", "LDLDIRECT" in the last 72 hours. Thyroid Function Tests No results for input(s): "TSH",  "T4TOTAL", "T3FREE", "THYROIDAB" in the last 72 hours.  Invalid input(s): "FREET3" _____________  ECHOCARDIOGRAM COMPLETE  Result Date: 11/16/2022    ECHOCARDIOGRAM REPORT   Patient Name:   TATEN MERROW [facility] Date of Exam: 11/16/2022 Medical Rec #:  [id number]           Height:       70.0 in Accession #:    [id number]          Weight:       177.7 lb Date of Birth:  October 18, 1934           BSA:          1.985 m Patient Age:    88 years            BP:           136/85 mmHg Patient Gender: M                   HR:           68 bpm. Exam Location:  Inpatient Procedure: 2D Echo, Cardiac Doppler and Color Doppler Indications:    Post TAVR evaluation V43.3 / Z95.2  History:        Patient has prior history of Echocardiogram examinations, most                 recent 11/15/2022. CAD, Prior CABG, Aortic Valve Disease,                 Arrythmias:RBBB; Risk Factors:Hypertension, Dyslipidemia and                 Former Smoker.                 Aortic Valve: 29 mm Edwards Sapien prosthetic, stented (TAVR)                 valve is present in the aortic position. Procedure Date:                 11/15/22.  Sonographer:  Aron Baba Referring Phys: [id number] Janetta Hora  Sonographer Comments: Image acquisition challenging due to respiratory motion. IMPRESSIONS  1. Left ventricular ejection fraction, by estimation, is 60 to 65%. The left ventricle has normal function. The left ventricle has no regional wall motion abnormalities. There is mild concentric left ventricular hypertrophy. Left ventricular diastolic function could not be evaluated.  2. Right ventricular systolic function is low normal. The right ventricular size is normal. There is mildly elevated pulmonary artery systolic pressure. The estimated right ventricular systolic pressure is 38.8 mmHg.  3. Left atrial size was severely dilated.  4. Right atrial size was moderately dilated.  5. The mitral valve is degenerative. Mild mitral valve regurgitation. No evidence of  mitral stenosis. Moderate mitral annular calcification.  6. Tricuspid valve regurgitation is mild to moderate.  7. The aortic valve has been repaired/replaced. Aortic valve regurgitation is not visualized. There is a 29 mm Edwards Sapien prosthetic (TAVR) valve present in the aortic position. Procedure Date: 11/15/22. Echo findings are consistent with normal structure and function of the aortic valve prosthesis. Aortic valve mean gradient measures 5.0 mmHg. Aortic valve Vmax measures 1.61 m/s. Aortic valve acceleration time measures 74 msec.  8. There is moderate dilatation of the ascending aorta, measuring 45 mm.  9. The inferior vena cava is dilated in size with <50% respiratory variability, suggesting right atrial pressure of 15 mmHg. FINDINGS  Left Ventricle: Left ventricular ejection fraction, by estimation, is 60 to 65%. The left ventricle has normal function. The left ventricle has no regional wall motion abnormalities. The left ventricular internal cavity size was normal in size. There is  mild concentric left ventricular hypertrophy. Left ventricular diastolic function could not be evaluated due to atrial fibrillation. Left ventricular diastolic function could not be evaluated. Right Ventricle: The right ventricular size is normal. No increase in right ventricular wall thickness. Right ventricular systolic function is low normal. There is mildly elevated pulmonary artery systolic pressure. The tricuspid regurgitant velocity is 2.44 m/s, and with an assumed right atrial pressure of 15 mmHg, the estimated right ventricular systolic pressure is 38.8 mmHg. Left Atrium: Left atrial size was severely dilated. Right Atrium: Right atrial size was moderately dilated. Pericardium: There is no evidence of pericardial effusion. Mitral Valve: The mitral valve is degenerative in appearance. Moderate mitral annular calcification. Mild mitral valve regurgitation. No evidence of mitral valve stenosis. Tricuspid Valve: The  tricuspid valve is normal in structure. Tricuspid valve regurgitation is mild to moderate. Aortic Valve: The aortic valve has been repaired/replaced. Aortic valve regurgitation is not visualized. Aortic valve mean gradient measures 5.0 mmHg. Aortic valve peak gradient measures 10.4 mmHg. Aortic valve area, by VTI measures 3.18 cm. There is a 29 mm Edwards Sapien prosthetic, stented (TAVR) valve present in the aortic position. Procedure Date: 11/15/22. Echo findings are consistent with normal structure and function of the aortic valve prosthesis. Pulmonic Valve: The pulmonic valve was grossly normal. Pulmonic valve regurgitation is trivial. Aorta: The aortic root is normal in size and structure. There is moderate dilatation of the ascending aorta, measuring 45 mm. Venous: The inferior vena cava is dilated in size with less than 50% respiratory variability, suggesting right atrial pressure of 15 mmHg. IAS/Shunts: No atrial level shunt detected by color flow Doppler.  LEFT VENTRICLE PLAX 2D LVIDd:         4.80 cm      Diastology LVIDs:         3.70 cm      LV  e' medial:    6.83 cm/s LV PW:         1.30 cm      LV E/e' medial:  19.0 LV IVS:        1.20 cm      LV e' lateral:   9.64 cm/s LVOT diam:     2.90 cm      LV E/e' lateral: 13.5 LV SV:         93 LV SV Index:   47 LVOT Area:     6.61 cm  LV Volumes (MOD) LV vol d, MOD A2C: 114.0 ml LV vol d, MOD A4C: 68.5 ml LV vol s, MOD A2C: 59.7 ml LV vol s, MOD A4C: 41.8 ml LV SV MOD A2C:     54.3 ml LV SV MOD A4C:     68.5 ml LV SV MOD BP:      39.1 ml LEFT ATRIUM            Index        RIGHT ATRIUM           Index LA diam:      5.70 cm  2.87 cm/m   RA Area:     25.40 cm LA Vol (A2C): 108.0 ml 54.41 ml/m  RA Volume:   75.70 ml  38.14 ml/m LA Vol (A4C): 125.0 ml 62.97 ml/m  AORTIC VALVE                     PULMONIC VALVE AV Area (Vmax):    3.05 cm      PR End Diast Vel: 5.11 msec AV Area (Vmean):   3.12 cm AV Area (VTI):     3.18 cm AV Vmax:           161.00 cm/s AV  Vmean:          103.067 cm/s AV VTI:            0.293 m AV Peak Grad:      10.4 mmHg AV Mean Grad:      5.0 mmHg LVOT Vmax:         74.40 cm/s LVOT Vmean:        48.700 cm/s LVOT VTI:          0.141 m LVOT/AV VTI ratio: 0.48  AORTA Ao Root diam: 3.70 cm Ao Asc diam:  4.50 cm MITRAL VALVE                TRICUSPID VALVE MV Area (PHT): 4.29 cm     TR Peak grad:   23.8 mmHg MV Decel Time: 177 msec     TR Vmax:        244.00 cm/s MR Peak grad: 89.1 mmHg MR Vmax:      472.00 cm/s   SHUNTS MV E velocity: 130.00 cm/s  Systemic VTI:  0.14 m                             Systemic Diam: 2.90 cm Rachelle Hora Croitoru MD Electronically signed by Thurmon Fair MD Signature Date/Time: 11/16/2022/12:58:19 PM    Final    ECHOCARDIOGRAM LIMITED  Result Date: 11/15/2022    ECHOCARDIOGRAM LIMITED REPORT   Patient Name:   SAKET HELLSTROM Date of Exam: 11/15/2022 Medical Rec #:  [id number]           Height:       70.0 in Accession #:  [id number]          Weight:       183.0 lb Date of Birth:  [birth year]           BSA:          2.010 m Patient Age:    88 years            BP:           149/103 mmHg Patient Gender: M                   HR:           82 bpm. Exam Location:  Inpatient Procedure: Limited Echo, Limited Color Doppler and Cardiac Doppler Indications:     TAVR. Aortic Stenosis.  History:         Patient has prior history of Echocardiogram examinations, most                  recent 09/26/2022. Prior CABG, Arrythmias:Atrial Fibrillation and                  RBBB; Risk Factors:Hypertension and Dyslipidemia.                  Aortic Valve: Edwards Sapien prosthetic, stented (TAVR) valve                  is present in the aortic position. Procedure Date: 11/15/2022.  Sonographer:     Delcie Roch RDCS Referring Phys:  [phone] Agapita Savarino Diagnosing Phys: Thurmon Fair MD                   PRE-PROCEDURAL FINDINGS:                   Normal left ventricular systolic function. Estimated LVEF 65%.                  There are no regional  wall motion abnormalities.                  Severe calcific aortic stenosis. Trileaflet aortic valve.                  Peak aortic valve gradient 54 mm Hg, mean gradient 32mm Hg.                  Dimensionless obstructive index 0.16, calculated aortic valve                  area is 0.66 cm (index for BSA 0.33 cm/m).                  Mild aortic insufficiency.                  Mild mitral insufficiency.                  No pericardial effusion.                   POST-PROCEDURAL FINDINGS:                   Normal left ventricular systolic function. Estimated LVEF 65%.                  There are no regional wall motion abnormalities.                  Well-seated TAVR stent-valve.  Peak aortic valve gradient 4 mm Hg, mean gradient 2 mm Hg.                  Dimensionless obstructive index 0.76, calculated aortic valve                  area is 3.43 cm (index for BSA 1.71 cm/m), acceleration time                  74 ms.                  No aortic insufficiency/perivalvular leak.                  Unchanged mild mitral insufficiency.                  No pericardial effusion IMPRESSIONS  1. Left ventricular ejection fraction, by estimation, is 60 to 65%. The left ventricle has normal function. The left ventricle has no regional wall motion abnormalities. There is mild concentric left ventricular hypertrophy.  2. No evidence of mitral stenosis. Moderate to severe mitral annular calcification.  3. Tricuspid valve regurgitation is mild to moderate.  4. The aortic valve has been repaired/replaced. Aortic valve regurgitation is trivial. There is a Statistician prosthetic (TAVR) valve present in the aortic position. Procedure Date: 11/15/2022. Aortic valve mean gradient measures 2.0 mmHg. Aortic valve Vmax measures 1.03 m/s. Aortic valve acceleration time measures 74 msec. FINDINGS  Left Ventricle: Left ventricular ejection fraction, by estimation, is 60 to 65%. The left ventricle has normal function. The  left ventricle has no regional wall motion abnormalities. The left ventricular internal cavity size was normal in size. There is  mild concentric left ventricular hypertrophy. Mitral Valve: Moderate to severe mitral annular calcification. No evidence of mitral valve stenosis. Tricuspid Valve: Tricuspid valve regurgitation is mild to moderate. Aortic Valve: The aortic valve has been repaired/replaced. Aortic valve regurgitation is trivial. Aortic regurgitation PHT measures 404 msec. Aortic valve mean gradient measures 2.0 mmHg. Aortic valve peak gradient measures 4.2 mmHg. Aortic valve area, by VTI measures 3.43 cm. There is a Statistician prosthetic, stented (TAVR) valve present in the aortic position. Procedure Date: 11/15/2022. Additional Comments: Spectral Doppler performed. Color Doppler performed.  LEFT VENTRICLE PLAX 2D LVOT diam:     2.40 cm LV SV:         73 LV SV Index:   37 LVOT Area:     4.52 cm  AORTIC VALVE AV Area (Vmax):    3.33 cm AV Area (Vmean):   3.08 cm AV Area (VTI):     3.43 cm AV Vmax:           103.00 cm/s AV Vmean:          68.500 cm/s AV VTI:            0.214 m AV Peak Grad:      4.2 mmHg AV Mean Grad:      2.0 mmHg LVOT Vmax:         75.71 cm/s LVOT Vmean:        46.595 cm/s LVOT VTI:          0.162 m LVOT/AV VTI ratio: 0.76 AI PHT:            404 msec  AORTA Ao Root diam: 3.50 cm TRICUSPID VALVE TR Peak grad:   27.0 mmHg TR Vmax:        260.00 cm/s  SHUNTS Systemic  VTI:  0.16 m Systemic Diam: 2.40 cm Thurmon Fair MD Electronically signed by Thurmon Fair MD Signature Date/Time: 11/15/2022/1:33:24 PM    Final    Structural Heart Procedure  Result Date: 11/15/2022 See surgical note for result.  DG Chest 2 View  Result Date: 11/14/2022 CLINICAL DATA:  Preoperative evaluation.  Aortic stenosis. EXAM: CHEST - 2 VIEW COMPARISON:  AP chest 06/13/2021 FINDINGS: Status post median sternotomy and CABG. Cardiac silhouette is again mildly enlarged. Moderate calcifications within the  aortic arch. The lungs are clear. No pleural effusion or pneumothorax. Flattening of the diaphragms and mild hyperinflation. Moderate mid to lower thoracic spine disc space narrowing. IMPRESSION: 1. Status post median sternotomy and CABG. 2. Mild cardiomegaly. 3. No acute cardiopulmonary process. Electronically Signed   By: Neita Garnet M.D.   On: 11/14/2022 11:37   CT CORONARY MORPH W/CTA COR W/SCORE W/CA W/CM &/OR WO/CM  Addendum Date: 10/19/2022   ADDENDUM REPORT: 10/19/2022 14:23 CLINICAL DATA:  Aortic stenosis EXAM: Cardiac TAVR CT TECHNIQUE: The patient was scanned on a Siemens Force 192 slice scanner. A 120 kV retrospective scan was triggered in the descending thoracic aorta at 111 HU's. Gantry rotation speed was 270 msecs and collimation was .9 mm. No beta blockade or nitro were given. The 3D data set was reconstructed in 5% intervals of the R-R cycle. Systolic and diastolic phases were analyzed on a dedicated work station using MPR, MIP and VRT modes. The patient received 80 cc of contrast. FINDINGS: Aortic Valve: Calcium Score 3643 Aorta: Aneurysmal ascending aorta. Normal arch vessels Pseudo coarctation with minimal diameter 25.8 mm dilates to 33 mm distally. Moderate calcific atherosclerosis Sinotubular Junction: 31 mm Ascending Thoracic Aorta: 40 mm Aortic Arch: 32 mm Descending Thoracic Aorta: 29 mm Sinus of Valsalva Measurements: Non-coronary: 37.5 mm Right - coronary: 36.2 mm Left - coronary: 38.4 mm Coronary Artery Height above Annulus: Left Main: 16.4 mm above annulus Right Coronary: 22.5 mm above annulus Virtual Basal Annulus Measurements: Maximum/Minimum Diameter: 31 mm x 23.6 mm Average diameter 27.3 mm Perimeter: 88.5 mm Area: 586 mm2 Coronary Arteries: Sufficient height above annulus for deployment Optimum Fluoroscopic Angle for Delivery: LAO 11 Caudal 7 degrees Membranous septal length 8.25 mm IMPRESSION: 1. [name] leaflet AV with calcium score 3643 2.  Annular area of 586 mm 2 suitable for  a 29 mm Sapien valve 3. Pseudo coarctation of the descending thoracic aorta just distal to the left subclavian take off 4.  Coronary arteries sufficient height above annulus for deployment 5. Optimum angiographic angle for deployment LAO 11 Caudal 7 degrees 6.  Membranous septal length 8.25 mm 7. Patent LIMA to LAD, Patent SVG;s to OM, D1 and sequential to PDA/PLB [name] Electronically Signed   By: [name] M.D.   On: 10/19/2022 14:23   Result Date: 10/19/2022 EXAM: OVER-READ INTERPRETATION  CT CHEST The following report is a limited chest CT over-read performed by radiologist Dr. Allegra Lai of [facility] on 10/19/2022. This over-read does not include interpretation of cardiac or coronary anatomy or pathology. The cardiac TAVR interpretation by the cardiologist is attached. COMPARISON:  None Available. FINDINGS: Extracardiac findings will be described separately under dictation for contemporaneously obtained CTA chest, abdomen and pelvis. IMPRESSION: Please see separate dictation for contemporaneously obtained CTA chest, abdomen and pelvis dated 10/19/2022 for full description of relevant extracardiac findings. Electronically Signed: By: Allegra Lai M.D. On: 10/19/2022 12:08   CT ANGIO ABDOMEN PELVIS  W & WO CONTRAST  Result Date: 10/19/2022  CLINICAL DATA:  Preop evaluation for aortic valve replacement. EXAM: CT ANGIOGRAPHY CHEST, ABDOMEN AND PELVIS TECHNIQUE: Non-contrast CT of the chest was initially obtained. Multidetector CT imaging through the chest, abdomen and pelvis was performed using the standard protocol during bolus administration of intravenous contrast. Multiplanar reconstructed images and MIPs were obtained and reviewed to evaluate the vascular anatomy. RADIATION DOSE REDUCTION: This exam was performed according to the departmental dose-optimization program which includes automated exposure control, adjustment of the mA and/or kV according to patient size and/or  use of iterative reconstruction technique. CONTRAST:  OMNIPAQUE IOHEXOL 350 MG/ML SOLN COMPARISON:  CT chest, abdomen and pelvis dated July 28, 2021 FINDINGS: CTA CHEST FINDINGS Cardiovascular: Cardiomegaly. No pericardial effusion. Aortic valve thickening and calcifications. Mildly dilated ascending thoracic aorta measuring up to 4.2 cm. Severe coronary artery calcifications status post CABG. Incomplete filling of the left atrial appendage. Mediastinum/Nodes: Esophagus and thyroid unremarkable. No enlarged lymph nodes seen in the chest. Lungs/Pleura: Central airways are patent. Calcified granuloma of the right lower lobe. Stable small solid pulmonary nodule of the right lung apex measuring 3 mm on series 5, image 18, no follow-up imaging is necessary. No consolidation, pleural effusion or pneumothorax. Musculoskeletal: No chest wall abnormality. No acute or significant osseous findings. Review of the MIP images confirms the above findings. CTA ABDOMEN AND PELVIS FINDINGS Hepatobiliary: No focal liver abnormality is seen. No gallstones, gallbladder wall thickening, or biliary dilatation. Pancreas: Unremarkable. No pancreatic ductal dilatation or surrounding inflammatory changes. Spleen: Normal in size without focal abnormality. Adrenals/Urinary Tract: Bilateral adrenal glands are unremarkable. No hydronephrosis. Bilateral low-attenuation renal lesions, largest are compatible simple cysts, others are too small to accurately characterize, further follow-up imaging is needed. Bilateral nonobstructing renal stones. Bladder is unremarkable. Stomach/Bowel: Stomach is within normal limits. Appendix appears normal. No evidence of bowel wall thickening, distention, or inflammatory changes. Vascular/lymphatic: Normal caliber abdominal aorta with moderate atherosclerotic disease. Enlarged lymph nodes seen in the abdomen or pelvis. Reproductive: Prostate is unremarkable. Other: No abdominal wall hernia or abnormality. No  abdominopelvic ascites. Musculoskeletal: No acute or significant osseous findings. VASCULAR MEASUREMENTS PERTINENT TO TAVR: AORTA: Minimal Aortic Diameter-15.8 mm Severity of Aortic Calcification-moderate RIGHT PELVIS: Right Common Iliac Artery - Minimal Diameter-9.8 mm Tortuosity-moderate Calcification-moderate Right External Iliac Artery - Minimal Diameter-9.7 mm Tortuosity-mild Calcification-mild Right Common Femoral Artery - Minimal Diameter-8.6 mm Tortuosity-none Calcification-moderate LEFT PELVIS: Left Common Iliac Artery - Minimal Diameter-9.2 mm Tortuosity-severe Calcification-moderate Left External Iliac Artery - Minimal Diameter-9.8 mm Tortuosity-mild Calcification-mild Left Common Femoral Artery - Minimal Diameter-7.7 mm Tortuosity-none Calcification-moderate Review of the MIP images confirms the above findings. IMPRESSION: 1. Vascular findings and measurements pertinent to potential TAVR procedure, as detailed above. 2. Thickening and calcification of the aortic valve, compatible with reported clinical history of aortic stenosis. 3. Moderate aortoiliac atherosclerosis. Severe coronary artery calcifications, status CABG. 4. Incomplete filling of the left atrial appendage, which may be due to contrast bolus timing, however recommend correlation with echocardiogram to exclude thrombus. 5. Dilated ascending thoracic aorta, measuring up to 4.2 cm. Recommend annual imaging followup by CTA or MRA. This recommendation follows 2010 ACCF/AHA/AATS/ACR/ASA/SCA/SCAI/SIR/STS/SVM Guidelines for the Diagnosis and Management of Patients with Thoracic Aortic Disease. Circulation. 2010; 121: U981-X914. Aortic aneurysm NOS (ICD10-I71.9) Electronically Signed   By: Allegra Lai M.D.   On: 10/19/2022 12:08   CT ANGIO CHEST AORTA W/CM & OR WO/CM  Result Date: 10/19/2022 CLINICAL DATA:  Preop evaluation for aortic valve replacement. EXAM: CT ANGIOGRAPHY CHEST, ABDOMEN AND PELVIS TECHNIQUE: Non-contrast CT  of the chest  was initially obtained. Multidetector CT imaging through the chest, abdomen and pelvis was performed using the standard protocol during bolus administration of intravenous contrast. Multiplanar reconstructed images and MIPs were obtained and reviewed to evaluate the vascular anatomy. RADIATION DOSE REDUCTION: This exam was performed according to the departmental dose-optimization program which includes automated exposure control, adjustment of the mA and/or kV according to patient size and/or use of iterative reconstruction technique. CONTRAST:  OMNIPAQUE IOHEXOL 350 MG/ML SOLN COMPARISON:  CT chest, abdomen and pelvis dated July 28, 2021 FINDINGS: CTA CHEST FINDINGS Cardiovascular: Cardiomegaly. No pericardial effusion. Aortic valve thickening and calcifications. Mildly dilated ascending thoracic aorta measuring up to 4.2 cm. Severe coronary artery calcifications status post CABG. Incomplete filling of the left atrial appendage. Mediastinum/Nodes: Esophagus and thyroid unremarkable. No enlarged lymph nodes seen in the chest. Lungs/Pleura: Central airways are patent. Calcified granuloma of the right lower lobe. Stable small solid pulmonary nodule of the right lung apex measuring 3 mm on series 5, image 18, no follow-up imaging is necessary. No consolidation, pleural effusion or pneumothorax. Musculoskeletal: No chest wall abnormality. No acute or significant osseous findings. Review of the MIP images confirms the above findings. CTA ABDOMEN AND PELVIS FINDINGS Hepatobiliary: No focal liver abnormality is seen. No gallstones, gallbladder wall thickening, or biliary dilatation. Pancreas: Unremarkable. No pancreatic ductal dilatation or surrounding inflammatory changes. Spleen: Normal in size without focal abnormality. Adrenals/Urinary Tract: Bilateral adrenal glands are unremarkable. No hydronephrosis. Bilateral low-attenuation renal lesions, largest are compatible simple cysts, others are too small to  accurately characterize, further follow-up imaging is needed. Bilateral nonobstructing renal stones. Bladder is unremarkable. Stomach/Bowel: Stomach is within normal limits. Appendix appears normal. No evidence of bowel wall thickening, distention, or inflammatory changes. Vascular/lymphatic: Normal caliber abdominal aorta with moderate atherosclerotic disease. Enlarged lymph nodes seen in the abdomen or pelvis. Reproductive: Prostate is unremarkable. Other: No abdominal wall hernia or abnormality. No abdominopelvic ascites. Musculoskeletal: No acute or significant osseous findings. VASCULAR MEASUREMENTS PERTINENT TO TAVR: AORTA: Minimal Aortic Diameter-15.8 mm Severity of Aortic Calcification-moderate RIGHT PELVIS: Right Common Iliac Artery - Minimal Diameter-9.8 mm Tortuosity-moderate Calcification-moderate Right External Iliac Artery - Minimal Diameter-9.7 mm Tortuosity-mild Calcification-mild Right Common Femoral Artery - Minimal Diameter-8.6 mm Tortuosity-none Calcification-moderate LEFT PELVIS: Left Common Iliac Artery - Minimal Diameter-9.2 mm Tortuosity-severe Calcification-moderate Left External Iliac Artery - Minimal Diameter-9.8 mm Tortuosity-mild Calcification-mild Left Common Femoral Artery - Minimal Diameter-7.7 mm Tortuosity-none Calcification-moderate Review of the MIP images confirms the above findings. IMPRESSION: 1. Vascular findings and measurements pertinent to potential TAVR procedure, as detailed above. 2. Thickening and calcification of the aortic valve, compatible with reported clinical history of aortic stenosis. 3. Moderate aortoiliac atherosclerosis. Severe coronary artery calcifications, status CABG. 4. Incomplete filling of the left atrial appendage, which may be due to contrast bolus timing, however recommend correlation with echocardiogram to exclude thrombus. 5. Dilated ascending thoracic aorta, measuring up to 4.2 cm. Recommend annual imaging followup by CTA or MRA. This  recommendation follows 2010 ACCF/AHA/AATS/ACR/ASA/SCA/SCAI/SIR/STS/SVM Guidelines for the Diagnosis and Management of Patients with Thoracic Aortic Disease. Circulation. 2010; 121: W295-A213. Aortic aneurysm NOS (ICD10-I71.9) Electronically Signed   By: Allegra Lai M.D.   On: 10/19/2022 12:08   Disposition   Pt is being discharged home today in good condition.  Follow-up Plans & Appointments     Follow-up Information     Janetta Hora, PA-C. Go on 11/25/2022.   Specialties: Cardiology, Radiology Why: @ 9:25 am. please arrive at least 10 minutes  early. Contact information: [address] [place] [address] [phone]                  Discharge Medications   Allergies as of 11/17/2022       Reactions   Ace Inhibitors Other (See Comments)   COUGH   Zocor [simvastatin] Other (See Comments)   MYALGIA   Nsaids Other (See Comments)   Would avoid given anticoagulation   Prednisone Other (See Comments)   Prednisone caused pt to have elevated BP.   Requip [ropinirole]    didn't sleep well and had trouble with abnormal dreams   Cephalexin Rash   Chocolate Other (See Comments)   Headaches   Doxycycline Other (See Comments)   Nausea and abd pain   Gabapentin Other (See Comments)   Dizziness   Hycodan [hydrocodone Bit-homatrop Mbr] Nausea And Vomiting   Oxycodone Nausea Only   Pt states that he cannot take prescription pain medication        Medication List     STOP taking these medications    diltiazem 120 MG 24 hr capsule Commonly known as: CARDIZEM CD       TAKE these medications    acetaminophen 325 MG tablet Commonly known as: TYLENOL Take 650 mg by mouth once as needed for moderate pain or headache.   amLODipine 10 MG tablet Commonly known as: NORVASC Take 1 tablet (10 mg total) by mouth daily. Start taking on: November 18, 2022   apixaban 5 MG Tabs tablet Commonly known as: Eliquis Take 1 tablet (5 mg total) by mouth 2  (two) times daily. Take evening dose starting 07/09/20   butalbital-acetaminophen-caffeine 50-325-40 MG tablet Commonly known as: FIORICET Take 1 tablet by mouth 3 (three) times daily as needed for migraine.   cetirizine 10 MG tablet Commonly known as: ZYRTEC Take 1 tablet (10 mg total) by mouth daily.   fluticasone 50 MCG/ACT nasal spray Commonly known as: FLONASE Place 1 spray into both nostrils daily. What changed:  when to take this reasons to take this   latanoprost 0.005 % ophthalmic solution Commonly known as: XALATAN Place 1 drop into both eyes at bedtime.   melatonin 3 MG Tabs tablet Take 3 mg by mouth at bedtime as needed (Sleep).   multivitamin with minerals tablet Take 1 tablet by mouth daily.   pravastatin 80 MG tablet Commonly known as: PRAVACHOL Take 1 tablet (80 mg total) by mouth every evening.   SYSTANE ULTRA OP Place 1-2 drops into both eyes once as needed (dry eyes.).         Outstanding Labs/Studies   none  Duration of Discharge Encounter   Greater than 30 minutes including physician time.  Byrd Hesselbach, PA-C 11/17/2022, 10:21 AM [phone]  Patient seen, examined. Available data reviewed. Agree with findings, assessment, and plan as outlined by Carlean Jews, PA-C.  The patient is independently interviewed and examined.  He is alert, oriented, in no distress.  HEENT is normal, neck with bleeding from his internal jugular catheter site and indwelling temporary pacing wire.  Lungs are clear bilaterally, heart is irregular with no murmur or gallop, abdomen is soft and nontender, groin sites are clear, extremities without edema.  Telemetry shows atrial fibrillation with left bundle branch block morphology, no evidence of high-grade heart block.  Appreciate EP evaluation.  The patient has deferred permanent pacemaker placement and it seems appropriate to perform live outpatient monitoring with watchful waiting, avoid AV nodal blocking  agents, and see him back in outpatient follow-up.  His postprocedural echo is reviewed and shows normal function of his aortic bioprosthesis with normal gradients and no paravalvular regurgitation.  The patient is medically stable for discharge from the hospital.  His temporary pacemaker was removed and hemostasis achieved at the IJ sheath insertion site.  Otherwise as outlined above.  Tonny Bollman, M.D. 11/17/2022 1:27 PM

## 2022-11-16 ENCOUNTER — Inpatient Hospital Stay (HOSPITAL_COMMUNITY): Payer: Medicare Other

## 2022-11-16 ENCOUNTER — Encounter (HOSPITAL_COMMUNITY): Payer: Self-pay | Admitting: Cardiovascular Disease

## 2022-11-16 DIAGNOSIS — Z952 Presence of prosthetic heart valve: Secondary | ICD-10-CM

## 2022-11-16 DIAGNOSIS — I442 Atrioventricular block, complete: Secondary | ICD-10-CM

## 2022-11-16 DIAGNOSIS — I10 Essential (primary) hypertension: Secondary | ICD-10-CM

## 2022-11-16 DIAGNOSIS — I350 Nonrheumatic aortic (valve) stenosis: Secondary | ICD-10-CM

## 2022-11-16 LAB — CBC
HCT: 35.1 % — ABNORMAL LOW (ref 39.0–52.0)
Hemoglobin: 12 g/dL — ABNORMAL LOW (ref 13.0–17.0)
MCH: 32.4 pg (ref 26.0–34.0)
MCHC: 34.2 g/dL (ref 30.0–36.0)
MCV: 94.9 fL (ref 80.0–100.0)
Platelets: 120 10*3/uL — ABNORMAL LOW (ref 150–400)
RBC: 3.7 MIL/uL — ABNORMAL LOW (ref 4.22–5.81)
RDW: 14.1 % (ref 11.5–15.5)
WBC: 8.6 10*3/uL (ref 4.0–10.5)
nRBC: 0 % (ref 0.0–0.2)

## 2022-11-16 LAB — ECHOCARDIOGRAM COMPLETE
AR max vel: 3.05 cm2
AV Area VTI: 3.18 cm2
AV Area mean vel: 3.12 cm2
AV Mean grad: 5 mmHg
AV Peak grad: 10.4 mmHg
Ao pk vel: 1.61 m/s
Area-P 1/2: 4.29 cm2
Calc EF: 40.7 %
Height: 70 in
MV M vel: 4.72 m/s
MV Peak grad: 89.1 mmHg
S' Lateral: 3.7 cm
Single Plane A2C EF: 47.6 %
Single Plane A4C EF: 39 %
Weight: 2843.05 oz

## 2022-11-16 LAB — HEPARIN LEVEL (UNFRACTIONATED): Heparin Unfractionated: 0.36 IU/mL (ref 0.30–0.70)

## 2022-11-16 LAB — BASIC METABOLIC PANEL
Anion gap: 7 (ref 5–15)
BUN: 14 mg/dL (ref 8–23)
CO2: 28 mmol/L (ref 22–32)
Calcium: 8.7 mg/dL — ABNORMAL LOW (ref 8.9–10.3)
Chloride: 102 mmol/L (ref 98–111)
Creatinine, Ser: 0.75 mg/dL (ref 0.61–1.24)
GFR, Estimated: >60 mL/min (ref >60)
Glucose, Bld: 113 mg/dL — ABNORMAL HIGH (ref 70–99)
Potassium: 4 mmol/L (ref 3.5–5.1)
Sodium: 137 mmol/L (ref 135–145)

## 2022-11-16 LAB — APTT: aPTT: 61 seconds — ABNORMAL HIGH (ref 24–36)

## 2022-11-16 LAB — MAGNESIUM: Magnesium: 2.1 mg/dL (ref 1.7–2.4)

## 2022-11-16 MED ORDER — HEPARIN (PORCINE) 25000 UT/250ML-% IV SOLN
1100.0000 [IU]/h | INTRAVENOUS | Status: DC
  Administered 2022-11-16: 1100 [IU]/h via INTRAVENOUS
  Filled 2022-11-16: qty 250

## 2022-11-16 MED ORDER — MECLIZINE HCL 25 MG PO TABS
25.0000 mg | ORAL_TABLET | Freq: Two times a day (BID) | ORAL | Status: DC
  Administered 2022-11-16 – 2022-11-17 (×3): 25 mg via ORAL
  Filled 2022-11-16 (×4): qty 1

## 2022-11-16 NOTE — Progress Notes (Signed)
ANTICOAGULATION CONSULT NOTE - Initial Consult  Pharmacy Consult for heparin  Indication: possible LAA thrombus.   Allergies  Allergen Reactions   Ace Inhibitors Other (See Comments)    COUGH   Zocor [Simvastatin] Other (See Comments)    MYALGIA   Nsaids Other (See Comments)    Would avoid given anticoagulation   Prednisone Other (See Comments)    Prednisone caused pt to have elevated BP.   Requip [Ropinirole]     didn't sleep well and had trouble with abnormal dreams   Cephalexin Rash   Chocolate Other (See Comments)    Headaches    Doxycycline Other (See Comments)    Nausea and abd pain   Gabapentin Other (See Comments)    Dizziness   Hycodan [Hydrocodone Bit-Homatrop Mbr] Nausea And Vomiting   Oxycodone Nausea Only    Pt states that he cannot take prescription pain medication    Patient Measurements: Height: 5\' 10"  (177.8 cm) Weight: 80.6 kg (177 lb 11.1 oz) IBW/kg (Calculated) : 73   Vital Signs: Temp: 97.9 F (36.6 C) (07/23 2317) Temp Source: Oral (07/23 2317) BP: 136/85 (07/24 0900) Pulse Rate: 70 (07/24 0900)  Labs: Recent Labs    11/15/22 1149 11/16/22 0414  HGB 10.2* 12.0*  HCT 30.0* 35.1*  PLT  --  120*  CREATININE 0.60* 0.75    Estimated Creatinine Clearance: 68.4 mL/min (by C-G formula based on SCr of 0.75 mg/dL).   Medical History: Past Medical History:  Diagnosis Date   Arthritis    Atrial fibrillation, permanent (HCC)    Bladder neoplasm    Chronic cough    NO CARDIAC OR PULMONARY RELATED   Coronary artery disease CARDIOLOGIST-  DR KLEIN/ ALLRED   a. s/p CABG 2004, b. LHC with patent grafts 07/2005, ejection fraction 50%;  c. 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 Excell Seltzer & Leafy Ro.   Severe aortic stenosis     Medications:  Medications Prior to Admission  Medication Sig Dispense Refill Last Dose   acetaminophen (TYLENOL) 325 MG tablet Take 650 mg by mouth once as needed for moderate pain or headache.   11/10/2022   apixaban (ELIQUIS) 5 MG TABS tablet Take 1 tablet (5 mg total) by mouth 2 (two) times daily. Take evening dose starting 07/09/20 180 tablet 3 11/12/2022   butalbital-acetaminophen-caffeine (FIORICET, ESGIC) 50-325-40 MG per tablet Take 1 tablet by mouth 3 (three) times daily as needed for migraine.    11/12/2022   cetirizine (ZYRTEC) 10 MG tablet Take 1 tablet (10 mg total) by mouth daily.   11/14/2022   diltiazem (CARDIZEM CD) 120 MG 24 hr capsule Take 120 mg by mouth daily.   11/14/2022   latanoprost (XALATAN) 0.005 % ophthalmic solution Place 1 drop into both eyes at bedtime.   11/14/2022   melatonin 3 MG TABS tablet Take 3 mg by mouth at bedtime as needed (Sleep).  11/14/2022   Multiple Vitamins-Minerals (MULTIVITAMIN WITH MINERALS) tablet Take 1 tablet by mouth daily.   11/10/2022   Polyethyl Glycol-Propyl Glycol (SYSTANE ULTRA OP) Place 1-2 drops into both eyes once as needed (dry eyes.).   11/14/2022   pravastatin (PRAVACHOL) 80 MG tablet Take 1 tablet (80 mg total) by mouth every evening. 90 tablet 0 11/14/2022   fluticasone (FLONASE) 50 MCG/ACT nasal spray Place 1 spray into both nostrils daily. (Patient taking differently: Place 1 spray into both nostrils daily as needed for allergies.) 16 g 1 Unknown   Scheduled:   Chlorhexidine Gluconate Cloth  6 each Topical Q0600   meclizine  25 mg Oral BID   pravastatin  80 mg Oral QPM   sodium chloride flush  3 mL Intravenous Q12H    Assessment: 88 yo male  s.p TAVR 7/23 and with possible LAA thrombus. He is on apixaban PTA for history afib (last dose 7/20). Pharmacy consulted to dose heparin   Goal of Therapy:  Heparin level 0.3-0.7 units/ml aPTT 66-102 seconds Monitor platelets by anticoagulation protocol: Yes   Plan:  -No heparin bolus due to recent procedure -start heparin at 1100 units/hr -aPTT and heparin level in 8 hrs  Harland German, PharmD Clinical Pharmacist **Pharmacist phone directory can now be found on [email or web address] ([email or web address]).  Listed under [facility].

## 2022-11-16 NOTE — Progress Notes (Signed)
ANTICOAGULATION CONSULT NOTE  Pharmacy Consult for heparin  Indication: possible LAA thrombus.   Allergies  Allergen Reactions   Ace Inhibitors Other (See Comments)    COUGH   Zocor [Simvastatin] Other (See Comments)    MYALGIA   Nsaids Other (See Comments)    Would avoid given anticoagulation   Prednisone Other (See Comments)    Prednisone caused pt to have elevated BP.   Requip [Ropinirole]     didn't sleep well and had trouble with abnormal dreams   Cephalexin Rash   Chocolate Other (See Comments)    Headaches    Doxycycline Other (See Comments)    Nausea and abd pain   Gabapentin Other (See Comments)    Dizziness   Hycodan [Hydrocodone Bit-Homatrop Mbr] Nausea And Vomiting   Oxycodone Nausea Only    Pt states that he cannot take prescription pain medication    Patient Measurements: Height: 5\' 10"  (177.8 cm) Weight: 80.6 kg (177 lb 11.1 oz) IBW/kg (Calculated) : 73   Vital Signs: Temp: 98.9 F (37.2 C) (07/24 1900) Temp Source: Oral (07/24 1900) BP: 119/77 (07/24 1800) Pulse Rate: 84 (07/24 1800)  Labs: Recent Labs    11/15/22 1149 11/16/22 0414 11/16/22 1803  HGB 10.2* 12.0*  --   HCT 30.0* 35.1*  --   PLT  --  120*  --   APTT  --   --  61*  HEPARINUNFRC  --   --  0.36  CREATININE 0.60* 0.75  --     Estimated Creatinine Clearance: 68.4 mL/min (by C-G formula based on SCr of 0.75 mg/dL).   Medical History: Past Medical History:  Diagnosis Date   Arthritis    Atrial fibrillation, permanent (HCC)    Bladder neoplasm    Chronic cough    NO CARDIAC OR PULMONARY RELATED   Coronary artery disease CARDIOLOGIST-  DR KLEIN/ ALLRED   a. s/p CABG 2004, b. LHC with patent grafts 07/2005, ejection fraction 50%;  c. 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 Excell Seltzer & Leafy Ro.   Severe aortic stenosis     Medications:  Medications Prior to Admission  Medication Sig Dispense Refill Last Dose   acetaminophen (TYLENOL) 325 MG tablet Take 650 mg by mouth once as needed for moderate pain or headache.   11/10/2022   apixaban (ELIQUIS) 5 MG TABS tablet Take 1 tablet (5 mg total) by mouth 2 (two) times daily. Take evening dose starting 07/09/20 180 tablet 3 11/12/2022   butalbital-acetaminophen-caffeine (FIORICET, ESGIC) 50-325-40 MG per tablet Take 1 tablet by mouth 3 (three) times daily as needed for migraine.    11/12/2022   cetirizine (ZYRTEC) 10 MG tablet Take 1 tablet (10 mg total) by mouth daily.   11/14/2022   diltiazem (CARDIZEM CD) 120 MG 24 hr capsule Take 120 mg by mouth daily.   11/14/2022   latanoprost (XALATAN) 0.005 % ophthalmic solution Place  1 drop into both eyes at bedtime.   11/14/2022   melatonin 3 MG TABS tablet Take 3 mg by mouth at bedtime as needed (Sleep).   11/14/2022   Multiple Vitamins-Minerals (MULTIVITAMIN WITH MINERALS) tablet Take 1 tablet by mouth daily.   11/10/2022   Polyethyl Glycol-Propyl Glycol (SYSTANE ULTRA OP) Place 1-2 drops into both eyes once as needed (dry eyes.).   11/14/2022   pravastatin (PRAVACHOL) 80 MG tablet Take 1 tablet (80 mg total) by mouth every evening. 90 tablet 0 11/14/2022   fluticasone (FLONASE) 50 MCG/ACT nasal spray Place 1 spray into both nostrils daily. (Patient taking differently: Place 1 spray into both nostrils daily as needed for allergies.) 16 g 1 Unknown   Scheduled:   Chlorhexidine Gluconate Cloth  6 each Topical Q0600   meclizine  25 mg Oral BID   pravastatin  80 mg Oral QPM    sodium chloride flush  3 mL Intravenous Q12H    Assessment: 89 yo male s.p TAVR 7/23 and with possible LAA thrombus. He is on apixaban PTA for history afib (last dose 7/20). Pharmacy consulted to dose heparin.  Initial aPTT is low, heparin level falsely high form DOAC use.  Goal of Therapy:  Heparin level 0.3-0.7 units/ml aPTT 66-102 seconds Monitor platelets by anticoagulation protocol: Yes   Plan:  -Increase heparin to 1300 units/h -Recheck aPTT, heparin level with am labs  Fredonia Highland, PharmD, BCPS, [facility] Clinical Pharmacist [phone] Please check AMION for all [facility] numbers 11/16/2022

## 2022-11-16 NOTE — Progress Notes (Signed)
  Echocardiogram 2D Echocardiogram has been performed.  Maren Reamer 11/16/2022, 10:48 AM

## 2022-11-16 NOTE — Progress Notes (Addendum)
[facility]   MULTIDISCIPLINARY HEART VALVE TEAM  Patient Name: [name] [name] Date of Encounter: 11/16/2022  Admit date: 11/15/2022  Primary Care Provider: Joaquim Nam, MD [facility] Cardiologist: [name], MD  [facility] Electrophysiologist:  None   [facility] Problem List     Principal Problem:   S/P TAVR (transcatheter aortic valve replacement) Active Problems:   HTN (hypertension)   Hyperlipidemia   GERD (gastroesophageal reflux disease)   Severe aortic stenosis   Neuropathy   Permanent atrial fibrillation (HCC)   S/P CABG x 5   RBBB     Subjective   Still nauseated with headache and dizziness.   Inpatient Medications    Scheduled Meds:  Chlorhexidine Gluconate Cloth  6 each Topical Q0600   meclizine  25 mg Oral BID   pravastatin  80 mg Oral QPM   sodium chloride flush  3 mL Intravenous Q12H   Continuous Infusions:  sodium chloride     nitroGLYCERIN 10 mcg/min (11/16/22 0800)   PRN Meds: sodium chloride, acetaminophen **OR** acetaminophen, melatonin, morphine injection, ondansetron (ZOFRAN) IV, sodium chloride flush, traMADol   Vital Signs    Vitals:   11/16/22 0645 11/16/22 0700 11/16/22 0800 11/16/22 0815  BP: 123/78 112/84 (!) 145/87 (!) 143/93  Pulse: 81 80 74 76  Resp: (!) 22 16 15 15   Temp:      TempSrc:      SpO2: 97% 94% 95% 96%  Weight:      Height:        Intake/Output Summary (Last 24 hours) at 11/16/2022 0829 Last data filed at 11/16/2022 0800 Gross per 24 hour  Intake 1638.07 ml  Output 1375 ml  Net 263.07 ml   Filed Weights   11/15/22 0724 11/16/22 0455  Weight: 83 kg 80.6 kg    Physical Exam    GEN: Well nourished, well developed, in no acute distress.  HEENT: Grossly normal.  Neck: Supple, no JVD, carotid bruits, or masses. Cardiac: irreg irreg, no murmurs, rubs, or gallops. No clubbing, cyanosis, edema.   Respiratory:  Respirations regular and unlabored, clear to auscultation  bilaterally. GI: Soft, nontender, nondistended, BS + x 4. MS: no deformity or atrophy. Skin: warm and dry, no rash.   Groin sites clear without hematoma or ecchymosis  Neuro:  Strength and sensation are intact. Psych: AAOx3.  Normal affect.  Labs    CBC Recent Labs    11/15/22 1149 11/16/22 0414  WBC  --  8.6  HGB 10.2* 12.0*  HCT 30.0* 35.1*  MCV  --  94.9  PLT  --  120*   Basic Metabolic Panel Recent Labs    29/56/21 1149 11/16/22 0414  NA 142 137  K 3.3* 4.0  CL 103 102  CO2  --  28  GLUCOSE 141* 113*  BUN 12 14  CREATININE 0.60* 0.75  CALCIUM  --  8.7*  MG  --  2.1   Liver Function Tests No results for input(s): "AST", "ALT", "ALKPHOS", "BILITOT", "PROT", "ALBUMIN" in the last 72 hours. No results for input(s): "LIPASE", "AMYLASE" in the last 72 hours. Cardiac Enzymes No results for input(s): "CKTOTAL", "CKMB", "CKMBINDEX", "TROPONINI" in the last 72 hours. BNP Invalid input(s): "POCBNP" D-Dimer No results for input(s): "DDIMER" in the last 72 hours. Hemoglobin A1C No results for input(s): "HGBA1C" in the last 72 hours. Fasting Lipid Panel No results for input(s): "CHOL", "HDL", "LDLCALC", "TRIG", "CHOLHDL", "LDLDIRECT" in the last 72 hours. Thyroid Function Tests No results  for input(s): "TSH", "T4TOTAL", "T3FREE", "THYROIDAB" in the last 72 hours.  Invalid input(s): "FREET3"  Telemetry    Atrial fibrillation with LBBB - Personally Reviewed  ECG    Afib with new LBBB, HR 77 - Personally Reviewed  Radiology    ECHOCARDIOGRAM LIMITED  Result Date: 11/15/2022    ECHOCARDIOGRAM LIMITED REPORT   Patient Name:   [name] [name] [facility] Date of Exam: 11/15/2022 Medical Rec #:  [id number]           Height:       70.0 in Accession #:    [id number]          Weight:       183.0 lb Date of Birth:  July 31, 1934           BSA:          2.010 m Patient Age:    88 years            BP:           149/103 mmHg Patient Gender: M                   HR:           82 bpm. Exam  Location:  Inpatient Procedure: Limited Echo, Limited Color Doppler and Cardiac Doppler Indications:     TAVR. Aortic Stenosis.  History:         Patient has prior history of Echocardiogram examinations, most                  recent 09/26/2022. Prior CABG, Arrythmias:Atrial Fibrillation and                  RBBB; Risk Factors:Hypertension and Dyslipidemia.                  Aortic Valve: Edwards Sapien prosthetic, stented (TAVR) valve                  is present in the aortic position. Procedure Date: 11/15/2022.  Sonographer:     Delcie Roch RDCS Referring Phys:  [phone] Yossi Hinchman Diagnosing Phys: Thurmon Fair MD                   PRE-PROCEDURAL FINDINGS:                   Normal left ventricular systolic function. Estimated LVEF 65%.                  There are no regional wall motion abnormalities.                  Severe calcific aortic stenosis. Trileaflet aortic valve.                  Peak aortic valve gradient 54 mm Hg, mean gradient 32mm Hg.                  Dimensionless obstructive index 0.16, calculated aortic valve                  area is 0.66 cm (index for BSA 0.33 cm/m).                  Mild aortic insufficiency.                  Mild mitral insufficiency.                  No pericardial effusion.  POST-PROCEDURAL FINDINGS:                   Normal left ventricular systolic function. Estimated LVEF 65%.                  There are no regional wall motion abnormalities.                  Well-seated TAVR stent-valve.                  Peak aortic valve gradient 4 mm Hg, mean gradient 2 mm Hg.                  Dimensionless obstructive index 0.76, calculated aortic valve                  area is 3.43 cm (index for BSA 1.71 cm/m), acceleration time                  74 ms.                  No aortic insufficiency/perivalvular leak.                  Unchanged mild mitral insufficiency.                  No pericardial effusion IMPRESSIONS  1. Left ventricular ejection fraction,  by estimation, is 60 to 65%. The left ventricle has normal function. The left ventricle has no regional wall motion abnormalities. There is mild concentric left ventricular hypertrophy.  2. No evidence of mitral stenosis. Moderate to severe mitral annular calcification.  3. Tricuspid valve regurgitation is mild to moderate.  4. The aortic valve has been repaired/replaced. Aortic valve regurgitation is trivial. There is a Statistician prosthetic (TAVR) valve present in the aortic position. Procedure Date: 11/15/2022. Aortic valve mean gradient measures 2.0 mmHg. Aortic valve Vmax measures 1.03 m/s. Aortic valve acceleration time measures 74 msec. FINDINGS  Left Ventricle: Left ventricular ejection fraction, by estimation, is 60 to 65%. The left ventricle has normal function. The left ventricle has no regional wall motion abnormalities. The left ventricular internal cavity size was normal in size. There is  mild concentric left ventricular hypertrophy. Mitral Valve: Moderate to severe mitral annular calcification. No evidence of mitral valve stenosis. Tricuspid Valve: Tricuspid valve regurgitation is mild to moderate. Aortic Valve: The aortic valve has been repaired/replaced. Aortic valve regurgitation is trivial. Aortic regurgitation PHT measures 404 msec. Aortic valve mean gradient measures 2.0 mmHg. Aortic valve peak gradient measures 4.2 mmHg. Aortic valve area, by VTI measures 3.43 cm. There is a Statistician prosthetic, stented (TAVR) valve present in the aortic position. Procedure Date: 11/15/2022. Additional Comments: Spectral Doppler performed. Color Doppler performed.  LEFT VENTRICLE PLAX 2D LVOT diam:     2.40 cm LV SV:         73 LV SV Index:   37 LVOT Area:     4.52 cm  AORTIC VALVE AV Area (Vmax):    3.33 cm AV Area (Vmean):   3.08 cm AV Area (VTI):     3.43 cm AV Vmax:           103.00 cm/s AV Vmean:          68.500 cm/s AV VTI:            0.214 m AV Peak Grad:      4.2 mmHg AV Mean Grad:  2.0 mmHg LVOT Vmax:         75.71 cm/s LVOT Vmean:        46.595 cm/s LVOT VTI:          0.162 m LVOT/AV VTI ratio: 0.76 AI PHT:            404 msec  AORTA Ao Root diam: 3.50 cm TRICUSPID VALVE TR Peak grad:   27.0 mmHg TR Vmax:        260.00 cm/s  SHUNTS Systemic VTI:  0.16 m Systemic Diam: 2.40 cm Thurmon Fair MD Electronically signed by Thurmon Fair MD Signature Date/Time: 11/15/2022/1:33:24 PM    Final    Structural Heart Procedure  Result Date: 11/15/2022 See surgical note for result.   Cardiac Studies   TAVR OPERATIVE NOTE     Date of Procedure:                11/15/2022   Preoperative Diagnosis:      Severe Aortic Stenosis    Postoperative Diagnosis:    Same    Procedure:        Transcatheter Aortic Valve Replacement - Percutaneous Transfemoral Approach             Edwards Sapien 3 Ultra Resilia THV (size 29 mm, serial # [id number] )              Co-Surgeons:                        Eugenio Hoes, MD and Tonny Bollman, MD   Anesthesiologist:                  Corky Sox, MD   Echocardiographer:              Thurmon Fair, MD   Pre-operative Echo Findings: Severe aortic stenosis Normal left ventricular systolic function   Post-operative Echo Findings: Trace paravalvular leak Normal/unchanged left ventricular systolic function   _____________________   Echo 11/16/22: pending   Patient Profile     [name] [name] is a 88 y.o. male with a history of CAD s/p CABG (2004 EBG), HTN, ventricular tachycardia, permanent atrial fibrillation on Eliquis, stroke and severe aortic stenosis with recent syncope who presented to [facility] on 11/15/22 for planned TAVR.   Assessment & Plan    Severe AS: s/p successful TAVR with a 29 mm Edwards Sapien 3 Ultra Resilia THV via the TF approach on 11/15/22. Post operative echo pending.    Transient CHB: pt had an underlying RBBB preop. He developed CHB and was pacer dependant post valve deployment. He is now in atrial fibrillation with new  LBBB. HRs in 80s. I turned down temp wire to 30s. EP consulted for possible pacemaker placement. NPO after midnight for possible device placement.  Dizziness: this has been ongoing since an occipital stroke last year. He has had worsening of this associated with nausea this morning. Will start meclizine. Continue zofran.   Persistent atrial fibrillation: rate well controlled. Start heparin gtt now given possible LAA thrombus seen on pre TAVR CT scans. Resume Eliquis once a decision has been made about device placement   HTN: BP mildly elevated but holding home Cardizem CD currently. Stop IV nitroglycerin gtt.   Possible LAA thrombus: pre TAVR CT scans questioned a possible LAA thrombus. Eliquis was only held for 2 days. Started on IV heparin as above.   CAD s/p CABG: pre TAVR cath showed patent bypass grafts. Continue medical therapy.  TAA: pre TAVR CT scans showed a dilated ascending thoracic aorta, measuring up to 4.2 cm. Recommend annual imaging followup by CTA or MRA.    Elevated Raise Score: 3 (age and RBBB). Also has a history of polyneuropathy. Will discuss and do workup for cardiac amyloid in the outpatient setting.  SignedCline Crock, PA-C  11/16/2022, 8:29 AM  Pager [phone]  Patient seen, examined. Available data reviewed. Agree with findings, assessment, and plan as outlined by Carlean Jews, PA-C.  The patient is independently interviewed and examined.  He is alert, oriented, in no distress.  He had a rough night with nausea and dizziness.  Dizziness has been a chronic problem ever since an occipital stroke, but it is worse today.  He states that when he got up he was very dizzy.  He had a few bites of breakfast this morning but feeling more nauseated now.  No chest pain or shortness of breath.  On exam, he is alert, oriented, in no distress.  Lungs are clear bilaterally, heart is irregularly irregular with no murmur or gallop, abdomen is soft and nontender, bilateral groin  sites are clear with no hematoma or ecchymoses, lower extremities have no edema.  Telemetry shows atrial fibrillation with left bundle branch morphology QRS.  Preoperatively he had a right bundle branch block.  There is occasional demand pacing.  We will leave his temporary transvenous pacemaker in place today.  EP consultation is pending.  I think he is at high risk for requiring permanent pacemaker.  We will treat him with heparin in the setting of his atrial fibrillation and possible left atrial appendage thrombus seen on preoperative TAVR imaging.  Keep in CV-ICU today.  Otherwise as above.  Tonny Bollman, M.D. 11/16/2022 10:35 AM

## 2022-11-16 NOTE — Consult Note (Addendum)
Cardiology Consultation   Patient ID: [name] [name] MRN: [id number]; DOB: Sep 09, 1934  Admit date: 11/15/2022 Date of Consult: 11/16/2022  PCP:  Joaquim Nam, MD   [facility] Providers Cardiologist:  [name], MD   {    Patient Profile:   [name] [name] is a 88 y.o. male with a hx of CAD (remote CABG), mild AS, VT (RVOT tx with amiodarone), PVD (L endarterectomy), PAFib, syncope (pust micturition), RBBB, stroke (Feb 2023)who is being seen 11/16/2022 for the evaluation of transient CHB and new LBBB post TAVR at the request of Dr. Excell Seltzer.  AF history: Initial diagnosis 1982 Amiodarone +/- 7 years (also with h/o RVOT VT) stopped June 2018 >> permanent AF   History of Present Illness:   Mr. [name] is followed out patient by r. Graciela Husbands and myself, echo done noted progression of his VHD and referred to structural team for evaluation/guidance on surveillance and management. Admitted yesterday for TAVR.  Pre-procedure has had IVCD and RBBB (LBBB has been mentioned in his record as well) and inter-op developed CHB, his temp wire kept in place, and EP made aware of his case.  His pre-TAVR CT noted possible LAA thrombus and is on heparin gtt post procedure  LABS K+ 4.0 Mag 2.1  BUN/Creat 14/0.75 WBC 8.6 H/H 12/35 Plts 120 (from 173)  Home meds (nodal agents) Diltiazem 120mg  daily  He has regained AV conduction with minimal brief pacing support though remains with LBBB, pacing via temp wire has been turned down to 30 this AM, wire remains in place  He c/w his known baseline dizziness particularly with movement of his head, no CP, SOB    Past Medical History:  Diagnosis Date   Arthritis    Atrial fibrillation, permanent (HCC)    Bladder neoplasm    Chronic cough    NO CARDIAC OR PULMONARY RELATED   Coronary artery disease CARDIOLOGIST-  DR KLEIN/ [facility]   a. s/p CABG 2004, b. LHC with patent grafts 07/2005, ejection fraction 50%;  c. 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 Excell Seltzer & Leafy Ro.   Severe aortic stenosis     Past Surgical History:  Procedure Laterality Date   CARDIAC CATHETERIZATION  07-26-2005  DR GAMBLE   PRESERVED LVF/  EF 50%/ PATENT GRAFTS   CARDIAC CATHETERIZATION  03-04-2003  DR GAMBLE   SEVERE 3 VESSEL DISEASE   CARDIAC CATHETERIZATION  1991  DR [facility]   PAF/ FALSE POSITIVE STRESS TEST   CAROTID ENDARTERECTOMY Left 2022   CATARACT EXTRACTION W/ INTRAOCULAR LENS IMPLANT Right    CORONARY ARTERY BYPASS GRAFT  03-08-2003  DR [id number]   LIMA TO LAD/ SVG TO OM2/  SVG TO DIAGONAL / SVG TO PDA & PLA   CYSTOSCOPY WITH BIOPSY N/A 12/25/2012   Procedure: CYSTOSCOPY WITH BLADDER BIOPSY;  Surgeon: Antony Haste, MD;  Location: [facility];  Service: Urology;  Laterality: N/A;   ELECTROPHYSIOLOGY STUDY  07-27-2005  DR Lewayne Bunting   MAPPING --   RESULT NONINDUCIBLE VT OR SVT/  DX RIGHT VENTRICULAR OUTFLOW TRACT VT AND RULES OUT MORE MALIGNANT CAUSES OF VT   ENDARTERECTOMY Left 07/08/2020   Procedure: LEFT CAROTID ENDARTERECTOMY;  Surgeon: Nada Libman, MD;  Location: [facility] OR;  Service: Vascular;  Laterality: Left;   INGUINAL HERNIA REPAIR Bilateral 1954  &  1990   INTRAOPERATIVE TRANSTHORACIC ECHOCARDIOGRAM N/A 11/15/2022   Procedure: INTRAOPERATIVE TRANSTHORACIC ECHOCARDIOGRAM;  Surgeon: Tonny Bollman, MD;  Location: [facility] INVASIVE CV LAB;  Service: Open Heart Surgery;  Laterality:  N/A;   KNEE ARTHROSCOPY Right 1994   LEFT HEART CATH AND CORS/GRAFTS ANGIOGRAPHY N/A 10/14/2022   Procedure: LEFT HEART CATH AND CORS/GRAFTS ANGIOGRAPHY;  Surgeon: Tonny Bollman, MD;  Location: [facility] INVASIVE CV LAB;  Service: Cardiovascular;  Laterality: N/A;   LUMBAR DISC SURGERY  1999   L4 -- L5   MOHS SURGERY     by Dr. Jeanella Craze 2019   REMOVAL VOCAL CORD POLYPS  1978   TONSILLECTOMY  AS CHILD   TRANSCATHETER AORTIC VALVE REPLACEMENT, TRANSFEMORAL N/A 11/15/2022   Procedure: Transcatheter Aortic Valve Replacement, Transfemoral;  Surgeon: Tonny Bollman, MD;  Location: [facility] INVASIVE CV LAB;  Service: Open Heart Surgery;  Laterality: N/A;   TRANSTHORACIC ECHOCARDIOGRAM  08/08/2011   MILD LVH/  EF 55-60%/  GRADE I DIASTOLIC DYSFUNCTION/ MILD AV STENOSIS     Home Medications:  Prior to Admission medications   Medication Sig Start Date End Date Taking? Authorizing Provider  acetaminophen (TYLENOL) 325 MG tablet Take 650 mg by mouth once as needed for moderate pain or headache.   Yes [provider]  apixaban (ELIQUIS) 5 MG TABS tablet Take 1 tablet (5 mg total) by mouth 2 (two) times daily. Take evening dose starting 07/09/20 07/09/20  Yes Baglia, Corrina, PA-C  butalbital-acetaminophen-caffeine (FIORICET, ESGIC) 50-325-40 MG per tablet Take 1 tablet by mouth 3 (three) times daily as needed for migraine.    Yes [provider]  cetirizine (ZYRTEC) 10 MG tablet Take 1 tablet (10 mg total) by mouth daily. 10/29/21  Yes Joaquim Nam, MD  diltiazem (CARDIZEM CD) 120 MG 24 hr capsule Take 120 mg by mouth daily.   Yes [provider]  latanoprost (XALATAN) 0.005 % ophthalmic solution Place 1 drop into both eyes at bedtime. 07/01/20  Yes [provider]  melatonin 3 MG TABS tablet Take 3 mg by mouth at bedtime as needed (Sleep).   Yes [provider]  Multiple Vitamins-Minerals (MULTIVITAMIN WITH MINERALS) tablet Take 1 tablet by mouth daily.   Yes [provider]  Polyethyl Glycol-Propyl Glycol (SYSTANE ULTRA OP) Place 1-2 drops into both eyes once as needed (dry eyes.).   Yes [provider]  pravastatin (PRAVACHOL) 80 MG tablet Take 1 tablet (80 mg total) by mouth every evening. 01/01/21  Yes Duke Salvia, MD  fluticasone [facility]) 50 MCG/ACT nasal spray Place 1 spray into both nostrils daily. Patient taking differently: Place 1 spray into both nostrils daily as needed for allergies. 09/11/18   Joaquim Nam, MD    Inpatient Medications: Scheduled Meds:  Chlorhexidine Gluconate Cloth  6 each Topical Q0600   meclizine  25 mg Oral BID   pravastatin  80 mg Oral QPM   sodium chloride flush  3 mL Intravenous Q12H   Continuous Infusions:  sodium chloride     heparin  nitroGLYCERIN Stopped (11/16/22 0850)   PRN Meds: sodium chloride, acetaminophen **OR** acetaminophen, melatonin, morphine injection, ondansetron (ZOFRAN) IV, sodium chloride flush, traMADol  Allergies:    Allergies  Allergen Reactions   Ace Inhibitors Other (See Comments)    COUGH   Zocor [Simvastatin] Other (See Comments)    MYALGIA   Nsaids Other (See Comments)    Would avoid given anticoagulation   Prednisone Other (See Comments)    Prednisone caused pt to have elevated BP.   Requip [Ropinirole]     didn't sleep well and had trouble with abnormal dreams   Cephalexin Rash   Chocolate Other (See Comments)    Headaches    Doxycycline Other (See Comments)    Nausea and abd pain   Gabapentin Other (See Comments)    Dizziness   Hycodan [Hydrocodone Bit-Homatrop Mbr] Nausea And Vomiting   Oxycodone Nausea Only    Pt states that he cannot take prescription pain medication    Social History:   Social History   Socioeconomic History   Marital status: Married    Spouse name: [name]   Number of children: 3   Years of education: Not on file   Highest education level: Not on file  Occupational History   Occupation: Retired  Tobacco Use    Smoking status: Former    Current packs/day: 0.00    Average packs/day: 1 pack/day for 25.0 years (25.0 ttl pk-yrs)    Types: Cigarettes    Start date: 04/26/1955    Quit date: 04/25/1980    Years since quitting: 42.5   Smokeless tobacco: Never  Vaping Use   Vaping status: Never Used  Substance and Sexual Activity   Alcohol use: No    Alcohol/week: 0.0 standard drinks of alcohol   Drug use: No   Sexual activity: Not on file  Other Topics Concern   Not on file  Social History Narrative   [employer] '56-'58, overseas to [place]   Some care through [place]   No service disability   Social Determinants of Health   Financial Resource Strain: Low Risk  (10/22/2021)   Overall Financial Resource Strain (CARDIA)    Difficulty of Paying Living Expenses: Not hard at all  Food Insecurity: No Food Insecurity (10/22/2021)   Hunger Vital Sign    Worried About Running Out of Food in the Last Year: Never true    Ran Out of Food in the Last Year: Never true  Transportation Needs: No Transportation Needs (10/22/2021)   PRAPARE - Administrator, Civil Service (Medical): No    Lack of Transportation (Non-Medical): No  Physical Activity: Inactive (10/22/2021)   Exercise Vital Sign    Days of Exercise per Week: 0 days    Minutes of Exercise per Session: 0 min  Stress: No Stress Concern Present (10/22/2021)   [employer] of Occupational Health - Occupational Stress Questionnaire    Feeling of Stress : Not at all  Social Connections: Unknown (09/06/2021)   Received from [facility]   Social Network    Social Network: Not on file  Intimate Partner Violence: Unknown (07/30/2021)   Received from [facility]   HITS    Physically Hurt: Not on file    Insult or Talk Down To: Not on file    Threaten Physical Harm: Not on file    Scream or Curse: Not on file    Family History:   Family History  Problem Relation Age of Onset  Cancer Mother    Stroke Neg Hx       ROS:  Please see the history of present illness.  All other ROS reviewed and negative.     Physical Exam/Data:   Vitals:   11/16/22 0900 11/16/22 0915 11/16/22 0930 11/16/22 0945  BP: 136/85 131/83 133/79 (!) 140/80  Pulse: 70 75 73 70  Resp: 10 13 11 15   Temp:      TempSrc:      SpO2: 97% 97% 98% 98%  Weight:      Height:        Intake/Output Summary (Last 24 hours) at 11/16/2022 1008 Last data filed at 11/16/2022 0900 Gross per 24 hour  Intake 1640.58 ml  Output 1375 ml  Net 265.58 ml      11/16/2022    4:55 AM 11/15/2022    7:24 AM 11/11/2022   11:06 AM  Last 3 Weights  Weight (lbs) 177 lb 11.1 oz 183 lb 183 lb  Weight (kg) 80.6 kg 83.008 kg 83.008 kg     Body mass index is 25.5 kg/m.  General:  Well nourished, well developed, in no acute distress HEENT: normal Neck: no JVD Vascular: No carotid bruits; Distal pulses 2+ bilaterally Cardiac:  RRR; no murmurs, gallops or rubs Lungs:  CTA b/l, no wheezing, rhonchi or rales  Abd: soft, nontender Ext: no edema Musculoskeletal:  No deformities Skin: warm and dry  Neuro:  baseline dizziness particularly with head movement, otherwise no focal abnormalities noted Psych:  Normal affect   EKG:  The EKG was personally reviewed and demonstrates:    Today is AFib LBBB ( )  Old: 11/11/22: AFib 75bpm, narrow QRS , PVC 09/28/22: AFib 100bpm, RBBB ( ), PVC  Telemetry:  Telemetry was personally reviewed and demonstrates:   AFib 60's-80's, infrequent brief V pacing   Relevant CV Studies:  11/15/22-post procedure, limited echo  1. Left ventricular ejection fraction, by estimation, is 60 to 65%. The  left ventricle has normal function. The left ventricle has no regional  wall motion abnormalities. There is mild concentric left ventricular  hypertrophy.   2. No evidence of mitral stenosis. Moderate to severe mitral annular  calcification.   3. Tricuspid valve regurgitation is mild to moderate.   4. The aortic  valve has been repaired/replaced. Aortic valve  regurgitation is trivial. There is a Statistician prosthetic (TAVR)  valve present in the aortic position. Procedure Date: 11/15/2022. Aortic  valve mean gradient measures 2.0 mmHg. Aortic  valve Vmax measures 1.03 m/s. Aortic valve acceleration time measures 74  msec.   Laboratory Data:  High Sensitivity Troponin:  No results for input(s): "TROPONINIHS" in the last 720 hours.   Chemistry Recent Labs  Lab 11/11/22 1041 11/15/22 1149 11/16/22 0414  NA 139 142 137  K 3.7 3.3* 4.0  CL 102 103 102  CO2 30  --  28  GLUCOSE 100* 141* 113*  BUN 14 12 14   CREATININE 0.79 0.60* 0.75  CALCIUM 9.2  --  8.7*  MG  --   --  2.1  GFRNONAA >60  --  >60  ANIONGAP 7  --  7    Recent Labs  Lab 11/11/22 1041  PROT 7.3  ALBUMIN 4.2  AST 23  ALT 16  ALKPHOS 62  BILITOT 0.6   Lipids No results for input(s): "CHOL", "TRIG", "HDL", "LABVLDL", "LDLCALC", "CHOLHDL" in the last 168 hours.  Hematology Recent Labs  Lab 11/11/22 1041 11/15/22 1149 11/16/22 0414  WBC 5.6  --  8.6  RBC 4.29  --  3.70*  HGB 13.7 10.2* 12.0*  HCT 41.8 30.0* 35.1*  MCV 97.4  --  94.9  MCH 31.9  --  32.4  MCHC 32.8  --  34.2  RDW 14.5  --  14.1  PLT 173  --  120*   Thyroid No results for input(s): "TSH", "FREET4" in the last 168 hours.  BNPNo results for input(s): "BNP", "PROBNP" in the last 168 hours.  DDimer No results for input(s): "DDIMER" in the last 168 hours.   Radiology/Studies:     Assessment and Plan:   CHB, transient post TAVR Baseline conduction system disease with at least intermittent RBBB Now with LBBB post TAVR AV conduction is improved   With his known conduction system disease and new LBBB despite recovery of his AV node I think he may still benefit from PPM  He has permanent Afib, at home on dilt and likely to need some HR management going forward again  I have discussed with the patient EP MD Renwick Asman see him later today for  further discussion and decisions    Risk Assessment/Risk Scores:     For questions or updates, please contact [facility] Please consult [email or web address] for contact info under    Signed, Sheilah Pigeon, PA-C  11/16/2022 10:08 AM  I have seen and examined this patient with Francis Dowse.  Agree with above, note added to reflect my findings.  Feeling well without complaint  GEN: Well nourished, well developed, in no acute distress  HEENT: normal  Neck: no JVD, carotid bruits, or masses Cardiac: Irregular; no murmurs, rubs, or gallops,no edema  Respiratory:  clear to auscultation bilaterally, normal work of breathing GI: soft, nontender, nondistended, + BS MS: no deformity or atrophy  Skin: warm and dry Neuro:  Strength and sensation are intact Psych: euthymic mood, full affect   Complete heart block: Was transient post TAVR.  Has a rate related right bundle branch block and now has a left bundle branch block.  Discussed pacemaker implant with the patient.  At this point, he is unsure whether or not he wants to proceed.  He does have to take care of his wife at home and is concerned about arm restrictions.  Arya Boxley discuss further with him tomorrow.  Fany Cavanaugh M. Sylvana Bonk MD 11/16/2022 12:04 PM

## 2022-11-17 ENCOUNTER — Telehealth: Payer: Self-pay | Admitting: Physician Assistant

## 2022-11-17 ENCOUNTER — Inpatient Hospital Stay (HOSPITAL_BASED_OUTPATIENT_CLINIC_OR_DEPARTMENT_OTHER)
Admit: 2022-11-17 | Discharge: 2022-11-17 | Disposition: A | Discharge status: Home / Self Care | Payer: Medicare Other | Attending: Physician Assistant | Admitting: Physician Assistant

## 2022-11-17 DIAGNOSIS — I4510 Unspecified right bundle-branch block: Secondary | ICD-10-CM | POA: Diagnosis not present

## 2022-11-17 DIAGNOSIS — Z952 Presence of prosthetic heart valve: Secondary | ICD-10-CM

## 2022-11-17 LAB — CBC
HCT: 35.2 % — ABNORMAL LOW (ref 39.0–52.0)
Hemoglobin: 11.7 g/dL — ABNORMAL LOW (ref 13.0–17.0)
MCH: 31.9 pg (ref 26.0–34.0)
RBC: 3.67 MIL/uL — ABNORMAL LOW (ref 4.22–5.81)
RDW: 14.3 % (ref 11.5–15.5)
nRBC: 0 % (ref 0.0–0.2)

## 2022-11-17 LAB — HEPARIN LEVEL (UNFRACTIONATED): Heparin Unfractionated: 0.52 IU/mL (ref 0.30–0.70)

## 2022-11-17 LAB — BASIC METABOLIC PANEL
Anion gap: 6 (ref 5–15)
BUN: 16 mg/dL (ref 8–23)
Calcium: 8.5 mg/dL — ABNORMAL LOW (ref 8.9–10.3)
Chloride: 101 mmol/L (ref 98–111)
Creatinine, Ser: 0.97 mg/dL (ref 0.61–1.24)
GFR, Estimated: >60 mL/min (ref >60)
Glucose, Bld: 98 mg/dL (ref 70–99)
Potassium: 4.6 mmol/L (ref 3.5–5.1)
Sodium: 134 mmol/L — ABNORMAL LOW (ref 135–145)

## 2022-11-17 LAB — APTT: aPTT: 101 seconds — ABNORMAL HIGH (ref 24–36)

## 2022-11-17 MED ORDER — LABETALOL HCL 5 MG/ML IV SOLN
5.0000 mg | INTRAVENOUS | Status: AC | PRN
  Administered 2022-11-17 (×2): 5 mg via INTRAVENOUS
  Filled 2022-11-17: qty 4

## 2022-11-17 MED ORDER — OXIDIZED CELLULOSE EX PADS
1.0000 | MEDICATED_PAD | Freq: Once | CUTANEOUS | Status: AC
  Administered 2022-11-17: 1 via TOPICAL
  Filled 2022-11-17: qty 1

## 2022-11-17 MED ORDER — AMLODIPINE BESYLATE 10 MG PO TABS: 10.0000 mg | ORAL_TABLET | Freq: Every day | ORAL | 6 refills | Status: DC

## 2022-11-17 MED ORDER — LABETALOL HCL 5 MG/ML IV SOLN
10.0000 mg | Freq: Once | INTRAVENOUS | Status: DC
Start: 2022-11-17 — End: 2022-11-17

## 2022-11-17 MED ORDER — HYDRALAZINE HCL 20 MG/ML IJ SOLN
5.0000 mg | Freq: Once | INTRAMUSCULAR | Status: DC
  Filled 2022-11-17: qty 1

## 2022-11-17 MED ORDER — LIDOCAINE-EPINEPHRINE (PF) 2 %-1:200000 IJ SOLN: 2.0000 mL | Freq: Once | INTRAMUSCULAR | Status: AC

## 2022-11-17 MED ORDER — AMLODIPINE BESYLATE 10 MG PO TABS
10.0000 mg | ORAL_TABLET | Freq: Every day | ORAL | Status: DC
  Administered 2022-11-17: 10 mg via ORAL
  Filled 2022-11-17: qty 1

## 2022-11-17 MED ORDER — LIDOCAINE-EPINEPHRINE (PF) 2 %-1:200000 IJ SOLN
INTRAMUSCULAR | Status: AC
  Administered 2022-11-17: 2 mL via INTRADERMAL
  Filled 2022-11-17: qty 20

## 2022-11-17 NOTE — Progress Notes (Signed)
CARDIAC REHAB PHASE I   Post TAVR education including site care, restrictions, heart healthy diet, exercise guidelines and CRP2 reviewed. All questions and concerns addressed. Will refer to [facility] for CRP2.    Woodroe Chen, RN BSN 11/17/2022 11:48 AM

## 2022-11-17 NOTE — Telephone Encounter (Signed)
Pt is known to have chronic afib. Transmission faxed to our office. Please disregard notificaiton.  Thank you!  KT

## 2022-11-17 NOTE — Progress Notes (Signed)
ZIO AT applied at hospital.  Dr. Graciela Husbands to read.

## 2022-11-17 NOTE — Progress Notes (Signed)
Pressure held on groin site until 0545. Dr Mackie Pai came to bedside to evaluate. He administered lidocaine/epinephrine at right groin site to stop bleeding. Advised to wait for day shift team to see RIJ site. Patient reports no pain at site. Patient given heart pillow to hold at site when coughing to prevent further bleeding.

## 2022-11-17 NOTE — Progress Notes (Addendum)
[facility]   MULTIDISCIPLINARY HEART VALVE TEAM  Patient Name: [name] [name] Date of Encounter: 11/17/2022  Admit date: 11/15/2022  Primary Care Provider: Joaquim Nam, MD [facility] Cardiologist: [name], MD  [facility] Electrophysiologist:  None   [facility] Problem List     Principal Problem:   S/P TAVR (transcatheter aortic valve replacement) Active Problems:   HTN (hypertension)   Hyperlipidemia   GERD (gastroesophageal reflux disease)   Severe aortic stenosis   Neuropathy   Permanent atrial fibrillation (HCC)   S/P CABG x 5   RBBB     Subjective   Bleeding from neck and groin. Still thinking about pacer placement. Anxious about getting home to his wife.   Inpatient Medications    Scheduled Meds:  amLODipine  10 mg Oral Daily   Chlorhexidine Gluconate Cloth  6 each Topical Q0600   meclizine  25 mg Oral BID   pravastatin  80 mg Oral QPM   sodium chloride flush  3 mL Intravenous Q12H   Continuous Infusions:  sodium chloride     heparin Stopped (11/17/22 0422)   PRN Meds: sodium chloride, acetaminophen **OR** acetaminophen, melatonin, morphine injection, ondansetron (ZOFRAN) IV, sodium chloride flush, traMADol   Vital Signs    Vitals:   11/17/22 0505 11/17/22 0600 11/17/22 0700 11/17/22 0800  BP: (!) 154/104 (!) 154/89 (!) 166/99 (!) 160/94  Pulse: 73 82 79 78  Resp: 15 15 15 19   Temp:      TempSrc:      SpO2: 98% 96% 97% 96%  Weight:      Height:        Intake/Output Summary (Last 24 hours) at 11/17/2022 0844 Last data filed at 11/17/2022 0700 Gross per 24 hour  Intake 213.25 ml  Output 1150 ml  Net -936.75 ml   Filed Weights   11/15/22 0724 11/16/22 0455 11/17/22 0500  Weight: 83 kg 80.6 kg 81.7 kg    Physical Exam    GEN: Well nourished, well developed, in no acute distress.  HEENT: Grossly normal.  Neck: Supple, no JVD, carotid bruits, or masses. Cardiac: irreg irreg, no murmurs, rubs, or gallops.  No clubbing, cyanosis, edema.   Respiratory:  Respirations regular and unlabored, clear to auscultation bilaterally. GI: Soft, nontender, nondistended, BS + x 4. MS: no deformity or atrophy. Skin: warm and dry, no rash.   Groin sites clear without hematoma or ecchymosis. Temp wire in internal jugular with frank blood.  Neuro:  Strength and sensation are intact. Psych: AAOx3.  Normal affect.  Labs    CBC Recent Labs    11/16/22 0414 11/17/22 0054  WBC 8.6 8.2  HGB 12.0* 11.7*  HCT 35.1* 35.2*  MCV 94.9 95.9  PLT 120* 103*   Basic Metabolic Panel Recent Labs    40/98/11 0414 11/17/22 0054  NA 137 134*  K 4.0 4.6  CL 102 101  CO2 28 27  GLUCOSE 113* 98  BUN 14 16  CREATININE 0.75 0.97  CALCIUM 8.7* 8.5*  MG 2.1  --    Liver Function Tests No results for input(s): "AST", "ALT", "ALKPHOS", "BILITOT", "PROT", "ALBUMIN" in the last 72 hours. No results for input(s): "LIPASE", "AMYLASE" in the last 72 hours. Cardiac Enzymes No results for input(s): "CKTOTAL", "CKMB", "CKMBINDEX", "TROPONINI" in the last 72 hours. BNP Invalid input(s): "POCBNP" D-Dimer No results for input(s): "DDIMER" in the last 72 hours. Hemoglobin A1C No results for input(s): "HGBA1C" in the last 72 hours. Fasting Lipid  Panel No results for input(s): "CHOL", "HDL", "LDLCALC", "TRIG", "CHOLHDL", "LDLDIRECT" in the last 72 hours. Thyroid Function Tests No results for input(s): "TSH", "T4TOTAL", "T3FREE", "THYROIDAB" in the last 72 hours.  Invalid input(s): "FREET3"  Telemetry    Atrial fibrillation with LBBB, PVCs - Personally Reviewed  ECG    Afib with new LBBB, HR 77 - Personally Reviewed  Radiology    ECHOCARDIOGRAM COMPLETE  Result Date: 11/16/2022    ECHOCARDIOGRAM REPORT   Patient Name:   [name] [facility] Date of Exam: 11/16/2022 Medical Rec #:  [id number]           Height:       70.0 in Accession #:    [id number]          Weight:       177.7 lb Date of Birth:  02/03/1935            BSA:          1.985 m Patient Age:    88 years            BP:           136/85 mmHg Patient Gender: M                   HR:           68 bpm. Exam Location:  Inpatient Procedure: 2D Echo, Cardiac Doppler and Color Doppler Indications:    Post TAVR evaluation V43.3 / Z95.2  History:        Patient has prior history of Echocardiogram examinations, most                 recent 11/15/2022. CAD, Prior CABG, Aortic Valve Disease,                 Arrythmias:RBBB; Risk Factors:Hypertension, Dyslipidemia and                 Former Smoker.                 Aortic Valve: 29 mm Edwards Sapien prosthetic, stented (TAVR)                 valve is present in the aortic position. Procedure Date:                 11/15/22.  Sonographer:    Aron Baba Referring Phys: [id number] Wille Celeste THOMPSON  Sonographer Comments: Image acquisition challenging due to respiratory motion. IMPRESSIONS  1. Left ventricular ejection fraction, by estimation, is 60 to 65%. The left ventricle has normal function. The left ventricle has no regional wall motion abnormalities. There is mild concentric left ventricular hypertrophy. Left ventricular diastolic function could not be evaluated.  2. Right ventricular systolic function is low normal. The right ventricular size is normal. There is mildly elevated pulmonary artery systolic pressure. The estimated right ventricular systolic pressure is 38.8 mmHg.  3. Left atrial size was severely dilated.  4. Right atrial size was moderately dilated.  5. The mitral valve is degenerative. Mild mitral valve regurgitation. No evidence of mitral stenosis. Moderate mitral annular calcification.  6. Tricuspid valve regurgitation is mild to moderate.  7. The aortic valve has been repaired/replaced. Aortic valve regurgitation is not visualized. There is a 29 mm Edwards Sapien prosthetic (TAVR) valve present in the aortic position. Procedure Date: 11/15/22. Echo findings are consistent with normal structure and function of the  aortic valve prosthesis. Aortic valve mean gradient measures 5.0 mmHg. Aortic  valve Vmax measures 1.61 m/s. Aortic valve acceleration time measures 74 msec.  8. There is moderate dilatation of the ascending aorta, measuring 45 mm.  9. The inferior vena cava is dilated in size with <50% respiratory variability, suggesting right atrial pressure of 15 mmHg. FINDINGS  Left Ventricle: Left ventricular ejection fraction, by estimation, is 60 to 65%. The left ventricle has normal function. The left ventricle has no regional wall motion abnormalities. The left ventricular internal cavity size was normal in size. There is  mild concentric left ventricular hypertrophy. Left ventricular diastolic function could not be evaluated due to atrial fibrillation. Left ventricular diastolic function could not be evaluated. Right Ventricle: The right ventricular size is normal. No increase in right ventricular wall thickness. Right ventricular systolic function is low normal. There is mildly elevated pulmonary artery systolic pressure. The tricuspid regurgitant velocity is 2.44 m/s, and with an assumed right atrial pressure of 15 mmHg, the estimated right ventricular systolic pressure is 38.8 mmHg. Left Atrium: Left atrial size was severely dilated. Right Atrium: Right atrial size was moderately dilated. Pericardium: There is no evidence of pericardial effusion. Mitral Valve: The mitral valve is degenerative in appearance. Moderate mitral annular calcification. Mild mitral valve regurgitation. No evidence of mitral valve stenosis. Tricuspid Valve: The tricuspid valve is normal in structure. Tricuspid valve regurgitation is mild to moderate. Aortic Valve: The aortic valve has been repaired/replaced. Aortic valve regurgitation is not visualized. Aortic valve mean gradient measures 5.0 mmHg. Aortic valve peak gradient measures 10.4 mmHg. Aortic valve area, by VTI measures 3.18 cm. There is a 29 mm Edwards Sapien prosthetic, stented (TAVR)  valve present in the aortic position. Procedure Date: 11/15/22. Echo findings are consistent with normal structure and function of the aortic valve prosthesis. Pulmonic Valve: The pulmonic valve was grossly normal. Pulmonic valve regurgitation is trivial. Aorta: The aortic root is normal in size and structure. There is moderate dilatation of the ascending aorta, measuring 45 mm. Venous: The inferior vena cava is dilated in size with less than 50% respiratory variability, suggesting right atrial pressure of 15 mmHg. IAS/Shunts: No atrial level shunt detected by color flow Doppler.  LEFT VENTRICLE PLAX 2D LVIDd:         4.80 cm      Diastology LVIDs:         3.70 cm      LV e' medial:    6.83 cm/s LV PW:         1.30 cm      LV E/e' medial:  19.0 LV IVS:        1.20 cm      LV e' lateral:   9.64 cm/s LVOT diam:     2.90 cm      LV E/e' lateral: 13.5 LV SV:         93 LV SV Index:   47 LVOT Area:     6.61 cm  LV Volumes (MOD) LV vol d, MOD A2C: 114.0 ml LV vol d, MOD A4C: 68.5 ml LV vol s, MOD A2C: 59.7 ml LV vol s, MOD A4C: 41.8 ml LV SV MOD A2C:     54.3 ml LV SV MOD A4C:     68.5 ml LV SV MOD BP:      39.1 ml LEFT ATRIUM            Index        RIGHT ATRIUM           Index LA diam:  5.70 cm  2.87 cm/m   RA Area:     25.40 cm LA Vol (A2C): 108.0 ml 54.41 ml/m  RA Volume:   75.70 ml  38.14 ml/m LA Vol (A4C): 125.0 ml 62.97 ml/m  AORTIC VALVE                     PULMONIC VALVE AV Area (Vmax):    3.05 cm      PR End Diast Vel: 5.11 msec AV Area (Vmean):   3.12 cm AV Area (VTI):     3.18 cm AV Vmax:           161.00 cm/s AV Vmean:          103.067 cm/s AV VTI:            0.293 m AV Peak Grad:      10.4 mmHg AV Mean Grad:      5.0 mmHg LVOT Vmax:         74.40 cm/s LVOT Vmean:        48.700 cm/s LVOT VTI:          0.141 m LVOT/AV VTI ratio: 0.48  AORTA Ao Root diam: 3.70 cm Ao Asc diam:  4.50 cm MITRAL VALVE                TRICUSPID VALVE MV Area (PHT): 4.29 cm     TR Peak grad:   23.8 mmHg MV Decel Time: 177  msec     TR Vmax:        244.00 cm/s MR Peak grad: 89.1 mmHg MR Vmax:      472.00 cm/s   SHUNTS MV E velocity: 130.00 cm/s  Systemic VTI:  0.14 m                             Systemic Diam: 2.90 cm Rachelle Hora Croitoru MD Electronically signed by Thurmon Fair MD Signature Date/Time: 11/16/2022/12:58:19 PM    Final    ECHOCARDIOGRAM LIMITED  Result Date: 11/15/2022    ECHOCARDIOGRAM LIMITED REPORT   Patient Name:   CHADDRICK BRUE [facility] Date of Exam: 11/15/2022 Medical Rec #:  [id number]           Height:       70.0 in Accession #:    [id number]          Weight:       183.0 lb Date of Birth:  June 26, 1934           BSA:          2.010 m Patient Age:    88 years            BP:           149/103 mmHg Patient Gender: M                   HR:           82 bpm. Exam Location:  Inpatient Procedure: Limited Echo, Limited Color Doppler and Cardiac Doppler Indications:     TAVR. Aortic Stenosis.  History:         Patient has prior history of Echocardiogram examinations, most                  recent 09/26/2022. Prior CABG, Arrythmias:Atrial Fibrillation and                  RBBB; Risk Factors:Hypertension and Dyslipidemia.  Aortic Valve: Edwards Sapien prosthetic, stented (TAVR) valve                  is present in the aortic position. Procedure Date: 11/15/2022.  Sonographer:     Delcie Roch RDCS Referring Phys:  [phone] Iniko Robles Diagnosing Phys: Thurmon Fair MD                   PRE-PROCEDURAL FINDINGS:                   Normal left ventricular systolic function. Estimated LVEF 65%.                  There are no regional wall motion abnormalities.                  Severe calcific aortic stenosis. Trileaflet aortic valve.                  Peak aortic valve gradient 54 mm Hg, mean gradient 32mm Hg.                  Dimensionless obstructive index 0.16, calculated aortic valve                  area is 0.66 cm (index for BSA 0.33 cm/m).                  Mild aortic insufficiency.                  Mild mitral  insufficiency.                  No pericardial effusion.                   POST-PROCEDURAL FINDINGS:                   Normal left ventricular systolic function. Estimated LVEF 65%.                  There are no regional wall motion abnormalities.                  Well-seated TAVR stent-valve.                  Peak aortic valve gradient 4 mm Hg, mean gradient 2 mm Hg.                  Dimensionless obstructive index 0.76, calculated aortic valve                  area is 3.43 cm (index for BSA 1.71 cm/m), acceleration time                  74 ms.                  No aortic insufficiency/perivalvular leak.                  Unchanged mild mitral insufficiency.                  No pericardial effusion IMPRESSIONS  1. Left ventricular ejection fraction, by estimation, is 60 to 65%. The left ventricle has normal function. The left ventricle has no regional wall motion abnormalities. There is mild concentric left ventricular hypertrophy.  2. No evidence of mitral stenosis. Moderate to severe mitral annular calcification.  3. Tricuspid valve regurgitation is mild to moderate.  4. The aortic  valve has been repaired/replaced. Aortic valve regurgitation is trivial. There is a Statistician prosthetic (TAVR) valve present in the aortic position. Procedure Date: 11/15/2022. Aortic valve mean gradient measures 2.0 mmHg. Aortic valve Vmax measures 1.03 m/s. Aortic valve acceleration time measures 74 msec. FINDINGS  Left Ventricle: Left ventricular ejection fraction, by estimation, is 60 to 65%. The left ventricle has normal function. The left ventricle has no regional wall motion abnormalities. The left ventricular internal cavity size was normal in size. There is  mild concentric left ventricular hypertrophy. Mitral Valve: Moderate to severe mitral annular calcification. No evidence of mitral valve stenosis. Tricuspid Valve: Tricuspid valve regurgitation is mild to moderate. Aortic Valve: The aortic valve has been  repaired/replaced. Aortic valve regurgitation is trivial. Aortic regurgitation PHT measures 404 msec. Aortic valve mean gradient measures 2.0 mmHg. Aortic valve peak gradient measures 4.2 mmHg. Aortic valve area, by VTI measures 3.43 cm. There is a Statistician prosthetic, stented (TAVR) valve present in the aortic position. Procedure Date: 11/15/2022. Additional Comments: Spectral Doppler performed. Color Doppler performed.  LEFT VENTRICLE PLAX 2D LVOT diam:     2.40 cm LV SV:         73 LV SV Index:   37 LVOT Area:     4.52 cm  AORTIC VALVE AV Area (Vmax):    3.33 cm AV Area (Vmean):   3.08 cm AV Area (VTI):     3.43 cm AV Vmax:           103.00 cm/s AV Vmean:          68.500 cm/s AV VTI:            0.214 m AV Peak Grad:      4.2 mmHg AV Mean Grad:      2.0 mmHg LVOT Vmax:         75.71 cm/s LVOT Vmean:        46.595 cm/s LVOT VTI:          0.162 m LVOT/AV VTI ratio: 0.76 AI PHT:            404 msec  AORTA Ao Root diam: 3.50 cm TRICUSPID VALVE TR Peak grad:   27.0 mmHg TR Vmax:        260.00 cm/s  SHUNTS Systemic VTI:  0.16 m Systemic Diam: 2.40 cm Thurmon Fair MD Electronically signed by Thurmon Fair MD Signature Date/Time: 11/15/2022/1:33:24 PM    Final    Structural Heart Procedure  Result Date: 11/15/2022 See surgical note for result.   Cardiac Studies   TAVR OPERATIVE NOTE     Date of Procedure:                11/15/2022   Preoperative Diagnosis:      Severe Aortic Stenosis    Postoperative Diagnosis:    Same    Procedure:        Transcatheter Aortic Valve Replacement - Percutaneous Transfemoral Approach             Edwards Sapien 3 Ultra Resilia THV (size 29 mm, serial # [id number] )              Co-Surgeons:                        Eugenio Hoes, MD and Tonny Bollman, MD   Anesthesiologist:                  Corky Sox, MD  Echocardiographer:              Thurmon Fair, MD   Pre-operative Echo Findings: Severe aortic stenosis Normal left ventricular systolic function    Post-operative Echo Findings: Trace paravalvular leak Normal/unchanged left ventricular systolic function   _____________________   Echo 11/16/22:  IMPRESSIONS   1. Left ventricular ejection fraction, by estimation, is 60 to 65%. The  left ventricle has normal function. The left ventricle has no regional  wall motion abnormalities. There is mild concentric left ventricular  hypertrophy. Left ventricular diastolic  function could not be evaluated.   2. Right ventricular systolic function is low normal. The right  ventricular size is normal. There is mildly elevated pulmonary artery  systolic pressure. The estimated right ventricular systolic pressure is  38.8 mmHg.   3. Left atrial size was severely dilated.   4. Right atrial size was moderately dilated.   5. The mitral valve is degenerative. Mild mitral valve regurgitation. No  evidence of mitral stenosis. Moderate mitral annular calcification.   6. Tricuspid valve regurgitation is mild to moderate.   7. The aortic valve has been repaired/replaced. Aortic valve  regurgitation is not visualized. There is a 29 mm Edwards Sapien  prosthetic (TAVR) valve present in the aortic position. Procedure Date:  11/15/22. Echo findings are consistent with normal  structure and function of the aortic valve prosthesis. Aortic valve mean  gradient measures 5.0 mmHg. Aortic valve Vmax measures 1.61 m/s. Aortic  valve acceleration time measures 74 msec.   8. There is moderate dilatation of the ascending aorta, measuring 45 mm.   9. The inferior vena cava is dilated in size with <50% respiratory  variability, suggesting right atrial pressure of 15 mmHg.    Patient Profile     NATHANAL HERMIZ is a 88 y.o. male with a history of CAD s/p CABG (2004 EBG), HTN, ventricular tachycardia, permanent atrial fibrillation on Eliquis, stroke and severe aortic stenosis with recent syncope who presented to [facility] on 11/15/22 for planned TAVR.   Assessment &  Plan    Severe AS: s/p successful TAVR with a 29 mm Edwards Sapien 3 Ultra Resilia THV via the TF approach on 11/15/22. Post operative showed EF 60%, normally functioning TAVR with a mean gradient of 5 mmHg and no PVL. Had some oozing from right groin overnight that was treated with lido/epi. This is now resolved.    Transient CHB: pt had an underlying RBBB preop. He developed CHB and was pacer dependant post valve deployment. He is now in atrial fibrillation with new LBBB. HRs in 90s. Pacer turned to 30 yesterday AM and he has not paced since then. EP consulted for possible pacemaker placement. Pt is thinking about it still. NPO for possible device placement.  Dizziness: this has been ongoing since an occipital stroke last year. He has had worsening of this associated with nausea yesterday. Started on meclizine. Continue zofran.   Persistent atrial fibrillation: rate in 90s. Home Cardizem held given bradycardia. Currently on heparin gtt but held given neck bleeding at temp pacer site. Resume Eliquis once a decision has been made about device placement.   HTN: BP mildly elevated but holding home Cardizem CD currently. Will start Norvasc now to cover while admitted.    Possible LAA thrombus: pre TAVR CT scans questioned a possible LAA thrombus. Eliquis was only held for 2 days. Started on IV heparin as above.   CAD s/p CABG: pre TAVR cath showed patent bypass grafts. Continue medical  therapy.    TAA: pre TAVR CT scans showed a dilated ascending thoracic aorta, measuring up to 4.2 cm. Recommend annual imaging followup by CTA or MRA.    Elevated Raise Score: 3 (age and RBBB). Also has a history of polyneuropathy. Will discuss and do workup for cardiac amyloid in the outpatient setting.  Byrd Hesselbach, PA-C  11/17/2022, 8:44 AM  Pager [phone]  Please see discharge summary note the same date.  Tonny Bollman 11/17/2022 1:30 PM

## 2022-11-17 NOTE — Progress Notes (Signed)
Rns at bedside attempted to remove patient's right internal jugular dressing due to excessive dried blood at site. When the dressing was being removed, the site immediately began to bleed, not able to be stopped with held gauze. RN spoke with Dr. Mackie Pai, Cardiology, to inform of current situation and requested to stop heparin infusion until 0700, to allow for clotting process and re-evaluation by AM team. Per Dr. Mackie Pai, OK to stop heparin infusion until AM.

## 2022-11-17 NOTE — Telephone Encounter (Signed)
[name] [name] - [facility] calling to give abnormal EKG result

## 2022-11-17 NOTE — Progress Notes (Signed)
ANTICOAGULATION CONSULT NOTE Pharmacy Consult for heparin  Indication: possible LAA thrombus.  Brief A/P: aPTT at higher end of goal  Decrease Heparin rate Allergies  Allergen Reactions   Ace Inhibitors Other (See Comments)    COUGH   Zocor [Simvastatin] Other (See Comments)    MYALGIA   Nsaids Other (See Comments)    Would avoid given anticoagulation   Prednisone Other (See Comments)    Prednisone caused pt to have elevated BP.   Requip [Ropinirole]     didn't sleep well and had trouble with abnormal dreams   Cephalexin Rash   Chocolate Other (See Comments)    Headaches    Doxycycline Other (See Comments)    Nausea and abd pain   Gabapentin Other (See Comments)    Dizziness   Hycodan [Hydrocodone Bit-Homatrop Mbr] Nausea And Vomiting   Oxycodone Nausea Only    Pt states that he cannot take prescription pain medication    Patient Measurements: Height: 5\' 10"  (177.8 cm) Weight: 80.6 kg (177 lb 11.1 oz) IBW/kg (Calculated) : 73   Vital Signs: Temp: 98.9 F (37.2 C) (07/24 1900) Temp Source: Oral (07/24 1900) BP: 139/89 (07/25 0125) Pulse Rate: 84 (07/25 0125)  Labs: Recent Labs    11/15/22 1149 11/16/22 0414 11/16/22 1803 11/17/22 0054  HGB 10.2* 12.0*  --  11.7*  HCT 30.0* 35.1*  --  35.2*  PLT  --  120*  --  103*  APTT  --   --  61* 101*  HEPARINUNFRC  --   --  0.36 0.52  CREATININE 0.60* 0.75  --  0.97    Estimated Creatinine Clearance: 56.4 mL/min (by C-G formula based on SCr of 0.97 mg/dL).  Assessment: 88 yo male s.p TAVR 7/23 and with possible LAA thrombus, Eliquis on hold, for heparin.  RN reports some bleeding around IV sites.   Goal of Therapy:  Heparin level 0.3-0.7 units/ml aPTT 66-102 seconds Monitor platelets by anticoagulation protocol: Yes   Plan: As aPTT is at higher end of goal and reporting some bleeding, will decrease heparin 1100 units/hr Check aPTT in 8 hours   Geannie Risen, PharmD, BCPS  11/17/2022

## 2022-11-17 NOTE — Telephone Encounter (Addendum)
Spoke with [name] [name] who reports Zio notification for first documented Afib with rate 71-102 at 1255pm CT.  Sustained for greater than 90 seconds.  She reports she will fax report.

## 2022-11-17 NOTE — Progress Notes (Signed)
At 0320, patient called nurses to bedside due to bleeding at groin and neck site. Patient reported that he was coughing a lot. Pressure held for 10 minutes and pharmacist and cardiology paged. Heparin lowered to 1100 units. Another 20 min of pressure held by RN. Plan of care ongoing.

## 2022-11-17 NOTE — Discharge Instructions (Signed)

## 2022-11-17 NOTE — Progress Notes (Signed)
Pacing wire pulled by MD at bedside RN verbal order to pull remaining sheath. Sheath pulled per verbal MD order, oozing bright red blood at site prior to- MD aware. RN held pressure at site post sheath removal for 5+ minutes, clot formed and dressing applied.

## 2022-11-17 NOTE — Telephone Encounter (Signed)
   Cardiac Monitor Alert  Date of alert:  11/17/2022   Patient Name: [name] [name]  DOB: Sep 06, 1934  MRN: [id number]   [facility] Cardiologist: [name], MD  [facility] EP:  None    Monitor Information: Long Term Monitor-Live Telemetry [ZioAT]  Reason:  Post TAVR Ordering provider:  Cline Crock, PA-C   Alert Atrial Fibrillation/Flutter This is the 1st alert for this rhythm.  The patient has a hx of Atrial Fibrillation/Flutter.  The patient is not currently on anticoagulation.  Anticoagulation medication as of 11/17/2022           apixaban (ELIQUIS) 5 MG TABS tablet Take 1 tablet (5 mg total) by mouth 2 (two) times daily. Take evening dose starting 07/09/20       Next Cardiology Appointment   Date:  11/25/2022  Provider:  Structural Heart Team   The patient could NOT be reached by telephone today.  Per Carolynne Edouard alert should be disregarded Pt with known persistent heart block. Monitor Alert reviewed and signed by Dr Excell Seltzer.    Alois Cliche, RN  11/17/2022 2:52 PM

## 2022-11-17 NOTE — Progress Notes (Addendum)
Patient Name: [name] [name] Date of Encounter: 11/17/2022  Primary Cardiologist: [name], MD Electrophysiologist: None  Interval Summary   The patient is doing well today. Bleeding from temp wire overnight.   At this time, the patient denies chest pain, shortness of breath, or any new concerns.  Inpatient Medications    Scheduled Meds:  amLODipine  10 mg Oral Daily   Chlorhexidine Gluconate Cloth  6 each Topical Q0600   meclizine  25 mg Oral BID   pravastatin  80 mg Oral QPM   sodium chloride flush  3 mL Intravenous Q12H   Continuous Infusions:  sodium chloride     heparin Stopped (11/17/22 0422)   PRN Meds: sodium chloride, acetaminophen **OR** acetaminophen, melatonin, morphine injection, ondansetron (ZOFRAN) IV, sodium chloride flush, traMADol   Vital Signs    Vitals:   11/17/22 0800 11/17/22 0900 11/17/22 0914 11/17/22 1000  BP: (!) 160/94 (!) 158/92 (!) 158/92 (!) 132/107  Pulse: 78 84 83 81  Resp: 19 18 12 14   Temp:    (!) 97 F (36.1 C)  TempSrc:    Axillary  SpO2: 96% 96% 96% 91%  Weight:      Height:        Intake/Output Summary (Last 24 hours) at 11/17/2022 1017 Last data filed at 11/17/2022 0700 Gross per 24 hour  Intake 210.74 ml  Output 1150 ml  Net -939.26 ml   Filed Weights   11/15/22 0724 11/16/22 0455 11/17/22 0500  Weight: 83 kg 80.6 kg 81.7 kg    Physical Exam    GEN- The patient is well appearing, alert and oriented x 3 today.   Lungs- Clear to ausculation bilaterally, normal work of breathing Cardiac- Regular rate and rhythm, no murmurs, rubs or gallops GI- soft, NT, ND, + BS Extremities- no clubbing or cyanosis. No edema  Telemetry    SR (personally reviewed)  Hospital Course    [name] [name] is a 88 y.o. male  with a hx of CAD (remote CABG), mild AS, VT (RVOT tx with amiodarone), PVD (L endarterectomy), PAFib, syncope (pust micturition), RBBB, stroke (Feb 2023)who is being seen 11/16/2022 for the evaluation of  transient CHB and new LBBB post TAVR at the request of Dr. Excell Seltzer. He has regained AV conduction with minimal brief pacing support post TAVR though remains with LBBB. Temp wire pacing turned down to rate of 40 on 7/24 and rhythm remained stable. Pt elected for outpatient Zio monitoring rather than PPM placement prior to discharge with concerns for ability to care for his wife. Temp pacing wire removed 7/25 per Dr. Elberta Fortis.    AF history: Initial diagnosis 1982 Amiodarone +/- 7 years (also with h/o RVOT VT) stopped June 2018 >> permanent AF  Assessment & Plan    Transient Complete Heart Block s/p TAVR  Baseline conduction system disease with at least intermittent rate related RBBB. Now with LBBB post TAVR. AV conduction is improved, temp wire removed.  -continue tele monitoring while inpatient  -discontinue cordis introducer  -plan for Zio monitor per primary  -pending results of above, may need EP follow up in future for PPM discussion should he develop symptomatic bradycardia  -ok for diet   For questions or updates, please contact [facility] Please consult [email or web address] for contact info under Cardiology/STEMI.  EP available PRN, please call back if new needs arise.   Signed, Canary Brim, MSN, APRN, NP-C, AGACNP-BC [facility] - Electrophysiology  11/17/2022, 10:25 AM   I have seen  and examined this patient with Canary Brim.  Agree with above, note added to reflect my findings.  Feeling well currently.  Has no chest pain or shortness of breath.  Unaware of atrial fibrillation.  GEN: Well nourished, well developed, in no acute distress  HEENT: normal  Neck: no JVD, carotid bruits, or masses Cardiac: iRRR; no murmurs, rubs, or gallops,no edema  Respiratory:  clear to auscultation bilaterally, normal work of breathing GI: soft, nontender, nondistended, + BS MS: no deformity or atrophy  Skin: warm and dry Neuro:  Strength and sensation are intact Psych: euthymic  mood, full affect   Transient heart block post TAVR: Fortunately conduction has improved.  He does have a left bundle branch block.  His QRS was narrow prior to his TAVR with a rate related right bundle branch block.  He cares for his wife and would prefer to avoid pacemaker implant as he would have less use of his left arm during healing.  Kolby Schara plan for ZIO monitor at discharge.  Adaleena Mooers M. Reynold Mantell MD 11/17/2022 3:00 PM

## 2022-11-18 ENCOUNTER — Ambulatory Visit: Payer: Self-pay

## 2022-11-18 ENCOUNTER — Telehealth: Payer: Self-pay

## 2022-11-18 ENCOUNTER — Telehealth: Payer: Self-pay | Admitting: *Deleted

## 2022-11-18 NOTE — Telephone Encounter (Signed)
Patient contacted regarding discharge from [facility] on 11/17/2022.  Patient understands to follow up with provider Carlean Jews PA-C on 11/25/2022 at 9:25 AM at [facility] office. Patient understands discharge instructions? yes Patient understands medications and regiment? yes Patient understands to bring all medications to this visit? Yes  The patient is doing well and is happy to be home.  The pt does complain of dizziness but is hopeful this will resolve over the next few days.  I advised the patient to change position slowly to help reduce risk of fall.  The patient's niece is helping care for his wife and his son from [facility] will be arriving shortly and staying for 4-5 days.

## 2022-11-18 NOTE — Anesthesia Postprocedure Evaluation (Signed)
Anesthesia Post Note  Patient: [name] [name]  Procedure(s) Performed: Transcatheter Aortic Valve Replacement, Transfemoral INTRAOPERATIVE TRANSTHORACIC ECHOCARDIOGRAM     Patient location during evaluation: Cath Lab Anesthesia Type: MAC Level of consciousness: awake and alert Pain management: pain level controlled Vital Signs Assessment: post-procedure vital signs reviewed and stable Respiratory status: spontaneous breathing, nonlabored ventilation and respiratory function stable Cardiovascular status: stable and blood pressure returned to baseline Postop Assessment: no apparent nausea or vomiting Anesthetic complications: no   There were no known notable events for this encounter.  Last Vitals:  Vitals:   11/17/22 0914 11/17/22 1000  BP: (!) 158/92 (!) 132/107  Pulse: 83 81  Resp: 12 14  Temp:  (!) 36.1 C  SpO2: 96% 91%    Last Pain:  Vitals:   11/17/22 1000  TempSrc: Axillary  PainSc: 3                  Sydney Azure

## 2022-11-18 NOTE — Transitions of Care (Post Inpatient/ED Visit) (Signed)
11/18/2022  Name: [name] [name] MRN: [id number] DOB: 1935/01/01  Today's TOC FU Call Status: Today's TOC FU Call Status:: Successful TOC FU Call Competed TOC FU Call Complete Date: 11/18/22  Transition Care Management Follow-up Telephone Call Date of Discharge: 11/17/22 Discharge Facility: [facility]) Type of Discharge: Inpatient Admission Primary Inpatient Discharge Diagnosis:: TAVR (transcatheter aortic valve replacement How have you been since you were released from the hospital?: Better Any questions or concerns?: No  Items Reviewed: Medications obtained,verified, and reconciled?: Yes (Medications Reviewed) Any new allergies since your discharge?: No Dietary orders reviewed?: No Do you have support at home?: Yes People in Home: spouse Name of Support/Comfort Primary Source: [name]  Medications Reviewed Today: Medications Reviewed Today     Reviewed by Luella Cook, RN (Case Manager) on 11/18/22 at 1407  Med List Status: <None>   Medication Order Taking? Sig Documenting Provider Last Dose Status Informant  acetaminophen (TYLENOL) 325 MG tablet [id number] Yes Take 650 mg by mouth once as needed for moderate pain or headache. [provider] Taking Active Self  amLODipine (NORVASC) 10 MG tablet [id number] Yes Take 1 tablet (10 mg total) by mouth daily. Janetta Hora, PA-C Taking Active   apixaban (ELIQUIS) 5 MG TABS tablet [id number] Yes Take 1 tablet (5 mg total) by mouth 2 (two) times daily. Take evening dose starting 07/09/20 Graceann Congress, PA-C Taking Active Self  butalbital-acetaminophen-caffeine (FIORICET, ESGIC) 50-325-40 MG per tablet [id number] Yes Take 1 tablet by mouth 3 (three) times daily as needed for migraine.  [provider] Taking Active Self           Med Note [name] Mar 17, 2020 11:06 AM)    cetirizine (ZYRTEC) 10 MG tablet [id number] Yes Take 1 tablet (10 mg total) by mouth daily. Joaquim Nam, MD Taking  Active Self  fluticasone Aleda Grana) 50 MCG/ACT nasal spray [id number] Yes Place 1 spray into both nostrils daily.  Patient taking differently: Place 1 spray into both nostrils daily as needed for allergies.   Joaquim Nam, MD Taking Active Self  latanoprost (XALATAN) 0.005 % ophthalmic solution [id number] Yes Place 1 drop into both eyes at bedtime. [provider] Taking Active Self  melatonin 3 MG TABS tablet [id number] Yes Take 3 mg by mouth at bedtime as needed (Sleep). [provider] Taking Active Self           Med Note Para March, [name]   Fri Oct 29, 2021  9:43 AM)    Multiple Vitamins-Minerals (MULTIVITAMIN WITH MINERALS) tablet [id number] Yes Take 1 tablet by mouth daily. [provider] Taking Active Self  Polyethyl Glycol-Propyl Glycol (SYSTANE ULTRA OP) [id number] Yes Place 1-2 drops into both eyes once as needed (dry eyes.). [provider] Taking Active Self  pravastatin (PRAVACHOL) 80 MG tablet [id number] Yes Take 1 tablet (80 mg total) by mouth every evening. Duke Salvia, MD Taking Active Self            Home Care and Equipment/Supplies: Were Home Health Services Ordered?: NA Any new equipment or medical supplies ordered?: NA  Functional Questionnaire: Do you need assistance with bathing/showering or dressing?: No Do you need assistance with meal preparation?: Yes Do you need assistance with eating?: No Do you need assistance with getting out of bed/getting out of a chair/moving?: No Do you have difficulty managing or taking your medications?: No  Follow up appointments reviewed: PCP Follow-up appointment confirmed?: NA Specialist Hospital Follow-up appointment  confirmed?: Yes Date of Specialist follow-up appointment?: 11/25/22 Follow-Up Specialty Provider:: Cline Crock [phone] Do you need transportation to your follow-up appointment?: No Do you understand care options if your condition(s) worsen?: Yes-patient verbalized  understanding  SDOH Interventions Today    Flowsheet Row Most Recent Value  SDOH Interventions   Food Insecurity Interventions Intervention Not Indicated  Housing Interventions Intervention Not Indicated  Transportation Interventions Patient Resources (Friends/Family), Intervention Not Indicated      Interventions Today    Flowsheet Row Most Recent Value  General Interventions   General Interventions Discussed/Reviewed General Interventions Discussed, General Interventions Reviewed, Doctor Visits  Doctor Visits Discussed/Reviewed Doctor Visits Discussed, Doctor Visits Reviewed  Pharmacy Interventions   Pharmacy Dicussed/Reviewed Pharmacy Topics Discussed, Pharmacy Topics Reviewed      TOC Interventions Today    Flowsheet Row Most Recent Value  TOC Interventions   TOC Interventions Discussed/Reviewed TOC Interventions Discussed, TOC Interventions Reviewed       Gean Maidens BSN RN [facility] [phone]

## 2022-11-18 NOTE — Chronic Care Management (AMB) (Signed)
   11/18/2022  [name] [name] Oct 11, 1934 [id number]   Reason for Encounter: Patient is not currently enrolled in the CCM program. CCM enrollment status changed to Previously enrolled.   [facility]/Chronic Care Management [phone]

## 2022-11-21 ENCOUNTER — Encounter: Payer: Self-pay | Admitting: Cardiovascular Disease

## 2022-11-22 ENCOUNTER — Telehealth (HOSPITAL_COMMUNITY): Payer: Self-pay

## 2022-11-22 ENCOUNTER — Encounter: Payer: Self-pay | Admitting: Cardiovascular Disease

## 2022-11-22 NOTE — Telephone Encounter (Signed)
Pt is unable to participate in the cardiac rehab program do to his wife's health issues. Closed referral.

## 2022-11-22 NOTE — Telephone Encounter (Signed)
[name] [name]  P Cv Div Ch St Triage (supporting Tonny Bollman, MD)3 hours ago (10:31 AM)    BP readings from yesterday, Monday and this morning, 7-29 @10 :30 am.    109/66-95      "   @2 :10 pm.       110/65-83      "   @5 :30 pm.       101/71-98 7-30 @7 :39 am.        110/74-99      "   @10 :00 am.      108/67-88 Still having dizziness when up and walking.  Some times worse than other.  Lightheaded.   Yesterday evening both feet and ankles swelled very tight. Better this AM. Foggy feeling in my head and mild pressure .

## 2022-11-24 DIAGNOSIS — I4510 Unspecified right bundle-branch block: Secondary | ICD-10-CM | POA: Diagnosis not present

## 2022-11-24 DIAGNOSIS — Z952 Presence of prosthetic heart valve: Secondary | ICD-10-CM | POA: Diagnosis not present

## 2022-11-25 ENCOUNTER — Encounter: Payer: Self-pay | Admitting: Cardiovascular Disease

## 2022-11-25 ENCOUNTER — Ambulatory Visit: Payer: Medicare Other | Attending: Cardiology | Admitting: Physician Assistant

## 2022-11-25 ENCOUNTER — Ambulatory Visit: Payer: Medicare Other

## 2022-11-25 VITALS — BP 124/72 | HR 86 | Ht 70.0 in | Wt 177.6 lb

## 2022-11-25 DIAGNOSIS — R42 Dizziness and giddiness: Secondary | ICD-10-CM

## 2022-11-25 DIAGNOSIS — Z952 Presence of prosthetic heart valve: Secondary | ICD-10-CM

## 2022-11-25 DIAGNOSIS — I442 Atrioventricular block, complete: Secondary | ICD-10-CM | POA: Diagnosis not present

## 2022-11-25 DIAGNOSIS — I2510 Atherosclerotic heart disease of native coronary artery without angina pectoris: Secondary | ICD-10-CM | POA: Diagnosis not present

## 2022-11-25 DIAGNOSIS — I712 Thoracic aortic aneurysm, without rupture, unspecified: Secondary | ICD-10-CM | POA: Diagnosis not present

## 2022-11-25 DIAGNOSIS — I4821 Permanent atrial fibrillation: Secondary | ICD-10-CM

## 2022-11-25 DIAGNOSIS — E854 Organ-limited amyloidosis: Secondary | ICD-10-CM | POA: Diagnosis not present

## 2022-11-25 DIAGNOSIS — I43 Cardiomyopathy in diseases classified elsewhere: Secondary | ICD-10-CM | POA: Diagnosis not present

## 2022-11-25 DIAGNOSIS — R1909 Other intra-abdominal and pelvic swelling, mass and lump: Secondary | ICD-10-CM

## 2022-11-25 DIAGNOSIS — I10 Essential (primary) hypertension: Secondary | ICD-10-CM | POA: Diagnosis not present

## 2022-11-25 MED ORDER — AMOXICILLIN 500 MG PO TABS: 2000.0000 mg | ORAL_TABLET | ORAL | 12 refills | Status: DC

## 2022-11-25 NOTE — Patient Instructions (Signed)
Medication Instructions: Your physician recommends that you continue on your current medications as directed. Please refer to the Current Medication list given to you today.  *If you need a refill on your cardiac medications before your next appointment, please call your pharmacy*   Lab Work: None ordered   If you have labs (blood work) drawn today and your tests are completely normal, you will receive your results only by: MyChart Message (if you have MyChart) OR A paper copy in the mail If you have any lab test that is abnormal or we need to change your treatment, we will call you to review the results.   Testing/Procedures: Your physician has requested that you have a ultrasound of your groin    Follow-Up: Follow up as scheduled   Other Instructions

## 2022-11-25 NOTE — Progress Notes (Unsigned)
[facility]   MULTIDISCIPLINARY HEART VALVE CLINIC                                     Cardiology Office Note:    Date:  11/28/2022   ID:  [name] [name], DOB 09/26/1934, MRN [id number]  PCP:  Quintella Reichert, MD  [facility] Cardiologist:  [name], MD  / Dr. Excell Seltzer & Dr. Leafy Ro (TAVR)  [facility] Electrophysiologist:  None   Referring MD: Joaquim Nam, MD   [facility] s/p TAVR  History of Present Illness:    [name] [name] is a 88 y.o. male with a hx of CAD s/p CABG (2004 EBG), HTN, carotid artery disease s/p CEA (06/2020), ventricular tachycardia, permanent atrial fibrillation on Eliquis, stroke and severe aortic stenosis s/p TAVR (11/15/22) who presents to clinic for follow up.   He had an occipital stroke in 2021. He had carotid endarterectomy in 06/2020. Patient was seen by Dr. 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 He was referred to Dr. Lucia Gaskins with neurology and underwent an extensive work up which was negative. Repeat MRI of the brain showed resolution of abnormalities. He has had ongoing dizziness since that time.   Echo showed moderate to severe AS and he was referred to Dr. Excell Seltzer for consideration of TAVR. He felt his dizziness was more related to his recent CVA and wanted to continue surveillance of aortic stenosis. Repeat echo on 09/26/22 showed EF 60% with progression of aortic stenosis to severe with mean grad 44 mmHg, AVA 0.54 cm2 as well as mod MR & AI and mild-mod TR. He was seen back by Dr. Excell Seltzer and reported a recent syncopal event and TAVR work up was initiated. [facility] 10/14/22 showed severe native CAD with patent bypass grafts. Given ongoing dizziness his hydrochlorothiazide/potassium were discontinued.   He underwent TAVR with a 29 mm Edwards Sapien 3 Ultra Resilia THV via the TF approach on 11/15/22. Of note,  pre TAVR CT scans questioned a possible LAA thrombus. Eliquis was only  held for 2 days pre op and he was treated with IV heparin during admission. Post operative showed EF 60%, normally functioning TAVR with a mean gradient of 5 mmHg and no PVL. He had an underlying RBBB preop and developed CHB and was pacer dependant post valve deployment. He then returned to atrial fibrillation with new LBBB. Pt was seen by EP and offered PPM placement but he ultimately declined. He was discharged home with a Zio AT to monitor for recurrent HAVB. Resumed on home Eliquis. Given CHB, home diltiazem was discontinued. His BPs were elevated and Norvasc 10mg  daily was added.    Today the patient presents to clinic for follow up. He feels "lousy." His dizziness is worse. He has had worsening swelling in ankles and feet since the hospital. He has also had cramping in calves. He reports a pain in his back as well as his arm where had cath. There is a hard lump in R groin where the catheter for the valve was placed that is not painful but also "not comfortable." He has a list of questions about his procedure which I answered for him. No chest pain. No shortness of breath. He is mostly upset about dizziness which makes it hard to take care of his wife.   Past Medical History:  Diagnosis Date   Arthritis    Atrial fibrillation, permanent (HCC)    Bladder neoplasm    Chronic cough    NO CARDIAC OR PULMONARY RELATED   Coronary artery disease CARDIOLOGIST-  DR KLEIN/ ALLRED   a. s/p CABG 2004, b. LHC with patent grafts 07/2005, ejection fraction 50%;  c. 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 Amiodarone Rx   S/P CABG x 5 2004   S/P TAVR (transcatheter  aortic valve replacement) 11/15/2022   s/p TAVR with a 29mm Edwards S3UR via the TF approach by Dr. Excell Seltzer & Leafy Ro.   Severe aortic stenosis     Past Surgical History:  Procedure Laterality Date   CARDIAC CATHETERIZATION  07-26-2005  DR GAMBLE   PRESERVED LVF/  EF 50%/ PATENT GRAFTS   CARDIAC CATHETERIZATION  03-04-2003  DR GAMBLE   SEVERE 3 VESSEL DISEASE   CARDIAC CATHETERIZATION  1991  DR [facility]   PAF/ FALSE POSITIVE STRESS TEST   CAROTID ENDARTERECTOMY Left 2022   CATARACT EXTRACTION W/ INTRAOCULAR LENS IMPLANT Right    CORONARY ARTERY BYPASS GRAFT  03-08-2003  DR GLOVFIEP   LIMA TO LAD/ SVG TO OM2/  SVG TO DIAGONAL / SVG TO PDA & PLA   CYSTOSCOPY WITH BIOPSY N/A 12/25/2012   Procedure: CYSTOSCOPY WITH BLADDER BIOPSY;  Surgeon: Antony Haste, MD;  Location: [facility];  Service: Urology;  Laterality: N/A;   ELECTROPHYSIOLOGY STUDY  07-27-2005  DR Lewayne Bunting   MAPPING --   RESULT NONINDUCIBLE VT OR SVT/  DX RIGHT VENTRICULAR OUTFLOW TRACT VT AND RULES OUT MORE MALIGNANT CAUSES OF VT   ENDARTERECTOMY Left 07/08/2020   Procedure: LEFT CAROTID ENDARTERECTOMY;  Surgeon: Nada Libman, MD;  Location: [facility] OR;  Service: Vascular;  Laterality: Left;   INGUINAL HERNIA REPAIR Bilateral 1954  &  1990   INTRAOPERATIVE TRANSTHORACIC ECHOCARDIOGRAM N/A 11/15/2022   Procedure: INTRAOPERATIVE TRANSTHORACIC ECHOCARDIOGRAM;  Surgeon: Tonny Bollman, MD;  Location: [facility] INVASIVE CV LAB;  Service: Open Heart Surgery;  Laterality: N/A;   KNEE ARTHROSCOPY Right 1994   LEFT HEART CATH AND CORS/GRAFTS ANGIOGRAPHY N/A 10/14/2022   Procedure: LEFT HEART CATH AND CORS/GRAFTS ANGIOGRAPHY;  Surgeon: Tonny Bollman, MD;  Location: [facility] INVASIVE CV LAB;  Service: Cardiovascular;  Laterality: N/A;   LUMBAR DISC SURGERY  1999   L4 -- L5   MOHS SURGERY     by Dr. Jeanella Craze 2019   REMOVAL VOCAL CORD POLYPS  1978   TONSILLECTOMY  AS CHILD   TRANSCATHETER AORTIC VALVE REPLACEMENT,  TRANSFEMORAL N/A 11/15/2022   Procedure: Transcatheter Aortic Valve Replacement, Transfemoral;  Surgeon: Tonny Bollman, MD;  Location: [facility] INVASIVE CV LAB;  Service: Open Heart Surgery;  Laterality: N/A;   TRANSTHORACIC ECHOCARDIOGRAM  08/08/2011   MILD LVH/  EF 55-60%/  GRADE I DIASTOLIC DYSFUNCTION/ MILD AV STENOSIS    Current Medications: Current Meds  Medication Sig   acetaminophen (TYLENOL) 325 MG tablet Take 650 mg by mouth once as needed for moderate pain or headache.   amoxicillin (AMOXIL) 500 MG tablet Take 4 tablets (2,000 mg total) by  mouth as directed. 1 hour prior to dental work including cleanings   apixaban (ELIQUIS) 5 MG TABS tablet Take 1 tablet (5 mg total) by mouth 2 (two) times daily. Take evening dose starting 07/09/20   butalbital-acetaminophen-caffeine (FIORICET, ESGIC) 50-325-40 MG per tablet Take 1 tablet by mouth 3 (three) times daily as needed for migraine.    cetirizine (ZYRTEC) 10 MG tablet Take 1 tablet (10 mg total) by mouth daily.   fluticasone (FLONASE) 50 MCG/ACT nasal spray Place 1 spray into both nostrils daily. (Patient taking differently: Place 1 spray into both nostrils daily as needed for allergies.)   hydrochlorothiazide (HYDRODIURIL) 25 MG tablet Take 25 mg by mouth daily.   latanoprost (XALATAN) 0.005 % ophthalmic solution Place 1 drop into both eyes at bedtime.   melatonin 3 MG TABS tablet Take 3 mg by mouth at bedtime as needed (Sleep).   Multiple Vitamins-Minerals (MULTIVITAMIN WITH MINERALS) tablet Take 1 tablet by mouth daily.   Polyethyl Glycol-Propyl Glycol (SYSTANE ULTRA OP) Place 1-2 drops into both eyes once as needed (dry eyes.).   potassium chloride (KLOR-CON M) 10 MEQ tablet Take 10 mEq by mouth daily.   pravastatin (PRAVACHOL) 80 MG tablet Take 1 tablet (80 mg total) by mouth every evening.     Allergies:   Ace inhibitors, Zocor [simvastatin], Nsaids, Prednisone, Requip [ropinirole], Cephalexin, Chocolate, Doxycycline, Gabapentin,  Hycodan [hydrocodone bit-homatrop mbr], and Oxycodone   Social History   Socioeconomic History   Marital status: Married    Spouse name: [name]   Number of children: 3   Years of education: Not on file   Highest education level: Not on file  Occupational History   Occupation: Retired  Tobacco Use   Smoking status: Former    Current packs/day: 0.00    Average packs/day: 1 pack/day for 25.0 years (25.0 ttl pk-yrs)    Types: Cigarettes    Start date: 04/26/1955    Quit date: 04/25/1980    Years since quitting: 42.6   Smokeless tobacco: Never  Vaping Use   Vaping status: Never Used  Substance and Sexual Activity   Alcohol use: No    Alcohol/week: 0.0 standard drinks of alcohol   Drug use: No   Sexual activity: Not on file  Other Topics Concern   Not on file  Social History Narrative   [employer] '56-'58, overseas to [place]   Some care through [place]   No service disability   Social Determinants of Health   Financial Resource Strain: Low Risk  (10/22/2021)   Overall Financial Resource Strain (CARDIA)    Difficulty of Paying Living Expenses: Not hard at all  Food Insecurity: No Food Insecurity (11/18/2022)   Hunger Vital Sign    Worried About Running Out of Food in the Last Year: Never true    Ran Out of Food in the Last Year: Never true  Transportation Needs: No Transportation Needs (11/18/2022)   PRAPARE - Administrator, Civil Service (Medical): No    Lack of Transportation (Non-Medical): No  Physical Activity: Inactive (10/22/2021)   Exercise Vital Sign    Days of Exercise per Week: 0 days    Minutes of Exercise per Session: 0 min  Stress: No Stress Concern Present (10/22/2021)   [employer] of Occupational Health - Occupational Stress Questionnaire    Feeling of Stress : Not at all  Social Connections: Unknown (09/06/2021)   Received from [facility]   Social Network    Social Network: Not on  file     Family History: The patient's family  history includes Cancer in his mother. There is no history of Stroke.  ROS:   Please see the history of present illness.    All other systems reviewed and are negative.  EKGs/Labs/Other Studies Reviewed:    Cardiac Studies & Procedures   CARDIAC CATHETERIZATION  CARDIAC CATHETERIZATION 10/14/2022  Narrative 1.  Severe native two-vessel coronary artery disease with severe left main stenosis, total occlusion of the LAD, total occlusion of the circumflex, and moderate calcified plaquing of the RCA 2.  Status post CABG with continued patency of the LIMA to LAD, saphenous vein graft to OM, saphenous vein graft to diagonal, and sequential saphenous vein graft to PDA and PLA branches 3.  Known severe aortic stenosis with direct catheter gradients of 32 mmHg mean  Plan: Continue evaluation for TAVR  Findings Coronary Findings Diagnostic  Dominance: Right  Left Main Ost LM to Mid LM lesion is 80% stenosed.  Left Anterior Descending Ost LAD to Prox LAD lesion is 100% stenosed.  Left Circumflex Ost Cx to Prox Cx lesion is 100% stenosed.  Right Coronary Artery Dominant vessel with diffuse heavy calcification.  The vessel is patent throughout.  The distal branches fill competitively via graft flow. Ost RCA to Prox RCA lesion is 40% stenosed. Dist RCA lesion is 50% stenosed.  Saphenous Graft To 1st Diag SVG.  The saphenous vein graft to first diagonal is patent with no stenosis  Saphenous Graft To 1st Mrg SVG.  The saphenous vein graft to first OM is widely patent with no stenosis.  The AV circumflex complex fills right prograde from the graft and also supplies the second OM branch  Sequential Jump Graft Graft To 1st RPL, RPDA Seq SVG- Right PDA and right PLA.  Patent graft with no significant stenosis  Intervention  No interventions have been documented.     ECHOCARDIOGRAM  ECHOCARDIOGRAM COMPLETE 11/16/2022  Narrative ECHOCARDIOGRAM REPORT    Patient Name:   [name] [name]  [facility] Date of Exam: 11/16/2022 Medical Rec #:  [id number]           Height:       70.0 in Accession #:    [id number]          Weight:       177.7 lb Date of Birth:  1935-03-07           BSA:          1.985 m Patient Age:    88 years            BP:           136/85 mmHg Patient Gender: M                   HR:           68 bpm. Exam Location:  Inpatient  Procedure: 2D Echo, Cardiac Doppler and Color Doppler  Indications:    Post TAVR evaluation V43.3 / Z95.2  History:        Patient has prior history of Echocardiogram examinations, most recent 11/15/2022. CAD, Prior CABG, Aortic Valve Disease, Arrythmias:RBBB; Risk Factors:Hypertension, Dyslipidemia and Former Smoker. Aortic Valve: 29 mm Edwards Sapien prosthetic, stented (TAVR) valve is present in the aortic position. Procedure Date: 11/15/22.  Sonographer:    Aron Baba Referring Phys: [id number] Wille Celeste    Sonographer Comments: Image acquisition challenging due to respiratory motion. IMPRESSIONS   1. Left ventricular ejection fraction, by estimation,  is 60 to 65%. The left ventricle has normal function. The left ventricle has no regional wall motion abnormalities. There is mild concentric left ventricular hypertrophy. Left ventricular diastolic function could not be evaluated. 2. Right ventricular systolic function is low normal. The right ventricular size is normal. There is mildly elevated pulmonary artery systolic pressure. The estimated right ventricular systolic pressure is 38.8 mmHg. 3. Left atrial size was severely dilated. 4. Right atrial size was moderately dilated. 5. The mitral valve is degenerative. Mild mitral valve regurgitation. No evidence of mitral stenosis. Moderate mitral annular calcification. 6. Tricuspid valve regurgitation is mild to moderate. 7. The aortic valve has been repaired/replaced. Aortic valve regurgitation is not visualized. There is a 29 mm Edwards Sapien prosthetic (TAVR) valve present  in the aortic position. Procedure Date: 11/15/22. Echo findings are consistent with normal structure and function of the aortic valve prosthesis. Aortic valve mean gradient measures 5.0 mmHg. Aortic valve Vmax measures 1.61 m/s. Aortic valve acceleration time measures 74 msec. 8. There is moderate dilatation of the ascending aorta, measuring 45 mm. 9. The inferior vena cava is dilated in size with <50% respiratory variability, suggesting right atrial pressure of 15 mmHg.  FINDINGS Left Ventricle: Left ventricular ejection fraction, by estimation, is 60 to 65%. The left ventricle has normal function. The left ventricle has no regional wall motion abnormalities. The left ventricular internal cavity size was normal in size. There is mild concentric left ventricular hypertrophy. Left ventricular diastolic function could not be evaluated due to atrial fibrillation. Left ventricular diastolic function could not be evaluated.  Right Ventricle: The right ventricular size is normal. No increase in right ventricular wall thickness. Right ventricular systolic function is low normal. There is mildly elevated pulmonary artery systolic pressure. The tricuspid regurgitant velocity is 2.44 m/s, and with an assumed right atrial pressure of 15 mmHg, the estimated right ventricular systolic pressure is 38.8 mmHg.  Left Atrium: Left atrial size was severely dilated.  Right Atrium: Right atrial size was moderately dilated.  Pericardium: There is no evidence of pericardial effusion.  Mitral Valve: The mitral valve is degenerative in appearance. Moderate mitral annular calcification. Mild mitral valve regurgitation. No evidence of mitral valve stenosis.  Tricuspid Valve: The tricuspid valve is normal in structure. Tricuspid valve regurgitation is mild to moderate.  Aortic Valve: The aortic valve has been repaired/replaced. Aortic valve regurgitation is not visualized. Aortic valve mean gradient measures 5.0 mmHg.  Aortic valve peak gradient measures 10.4 mmHg. Aortic valve area, by VTI measures 3.18 cm. There is a 29 mm Edwards Sapien prosthetic, stented (TAVR) valve present in the aortic position. Procedure Date: 11/15/22. Echo findings are consistent with normal structure and function of the aortic valve prosthesis.  Pulmonic Valve: The pulmonic valve was grossly normal. Pulmonic valve regurgitation is trivial.  Aorta: The aortic root is normal in size and structure. There is moderate dilatation of the ascending aorta, measuring 45 mm.  Venous: The inferior vena cava is dilated in size with less than 50% respiratory variability, suggesting right atrial pressure of 15 mmHg.  IAS/Shunts: No atrial level shunt detected by color flow Doppler.   LEFT VENTRICLE PLAX 2D LVIDd:         4.80 cm      Diastology LVIDs:         3.70 cm      LV e' medial:    6.83 cm/s LV PW:         1.30 cm  LV E/e' medial:  19.0 LV IVS:        1.20 cm      LV e' lateral:   9.64 cm/s LVOT diam:     2.90 cm      LV E/e' lateral: 13.5 LV SV:         93 LV SV Index:   47 LVOT Area:     6.61 cm  LV Volumes (MOD) LV vol d, MOD A2C: 114.0 ml LV vol d, MOD A4C: 68.5 ml LV vol s, MOD A2C: 59.7 ml LV vol s, MOD A4C: 41.8 ml LV SV MOD A2C:     54.3 ml LV SV MOD A4C:     68.5 ml LV SV MOD BP:      39.1 ml  LEFT ATRIUM            Index        RIGHT ATRIUM           Index LA diam:      5.70 cm  2.87 cm/m   RA Area:     25.40 cm LA Vol (A2C): 108.0 ml 54.41 ml/m  RA Volume:   75.70 ml  38.14 ml/m LA Vol (A4C): 125.0 ml 62.97 ml/m AORTIC VALVE                     PULMONIC VALVE AV Area (Vmax):    3.05 cm      PR End Diast Vel: 5.11 msec AV Area (Vmean):   3.12 cm AV Area (VTI):     3.18 cm AV Vmax:           161.00 cm/s AV Vmean:          103.067 cm/s AV VTI:            0.293 m AV Peak Grad:      10.4 mmHg AV Mean Grad:      5.0 mmHg LVOT Vmax:         74.40 cm/s LVOT Vmean:        48.700 cm/s LVOT VTI:           0.141 m LVOT/AV VTI ratio: 0.48  AORTA Ao Root diam: 3.70 cm Ao Asc diam:  4.50 cm  MITRAL VALVE                TRICUSPID VALVE MV Area (PHT): 4.29 cm     TR Peak grad:   23.8 mmHg MV Decel Time: 177 msec     TR Vmax:        244.00 cm/s MR Peak grad: 89.1 mmHg MR Vmax:      472.00 cm/s   SHUNTS MV E velocity: 130.00 cm/s  Systemic VTI:  0.14 m Systemic Diam: 2.90 cm  Rachelle Hora Croitoru MD Electronically signed by Thurmon Fair MD Signature Date/Time: 11/16/2022/12:58:19 PM    Final     CT SCANS  CT CORONARY MORPH W/CTA COR W/SCORE 10/19/2022  Addendum 10/19/2022  2:26 PM ADDENDUM REPORT: 10/19/2022 14:23  CLINICAL DATA:  Aortic stenosis  EXAM: Cardiac TAVR CT  TECHNIQUE: The patient was scanned on a Siemens Force 192 slice scanner. A 120 kV retrospective scan was triggered in the descending thoracic aorta at 111 HU's. Gantry rotation speed was 270 msecs and collimation was .9 mm. No beta blockade or nitro were given. The 3D data set was reconstructed in 5% intervals of the R-R cycle. Systolic and diastolic phases were analyzed on a dedicated work station  using MPR, MIP and VRT modes. The patient received 80 cc of contrast.  FINDINGS: Aortic Valve: Calcium Score 3643  Aorta: Aneurysmal ascending aorta. Normal arch vessels Pseudo coarctation with minimal diameter 25.8 mm dilates to 33 mm distally. Moderate calcific atherosclerosis  Sinotubular Junction: 31 mm  Ascending Thoracic Aorta: 40 mm  Aortic Arch: 32 mm  Descending Thoracic Aorta: 29 mm  Sinus of Valsalva Measurements:  Non-coronary: 37.5 mm  Right - coronary: 36.2 mm  Left - coronary: 38.4 mm  Coronary Artery Height above Annulus:  Left Main: 16.4 mm above annulus  Right Coronary: 22.5 mm above annulus  Virtual Basal Annulus Measurements:  Maximum/Minimum Diameter: 31 mm x 23.6 mm Average diameter 27.3 mm  Perimeter: 88.5 mm  Area: 586 mm2  Coronary Arteries: Sufficient height  above annulus for deployment  Optimum Fluoroscopic Angle for Delivery: LAO 11 Caudal 7 degrees  Membranous septal length 8.25 mm  IMPRESSION: 1. [name] leaflet AV with calcium score 3643  2.  Annular area of 586 mm 2 suitable for a 29 mm Sapien valve  3. Pseudo coarctation of the descending thoracic aorta just distal to the left subclavian take off  4.  Coronary arteries sufficient height above annulus for deployment  5. Optimum angiographic angle for deployment LAO 11 Caudal 7 degrees  6.  Membranous septal length 8.25 mm  7. Patent LIMA to LAD, Patent SVG;s to OM, D1 and sequential to PDA/PLB  [name]   Electronically Signed By: [name] M.D. On: 10/19/2022 14:23  Narrative EXAM: OVER-READ INTERPRETATION  CT CHEST  The following report is a limited chest CT over-read performed by radiologist Dr. Allegra Lai of [facility] on 10/19/2022. This over-read does not include interpretation of cardiac or coronary anatomy or pathology. The cardiac TAVR interpretation by the cardiologist is attached.  COMPARISON:  None Available.  FINDINGS: Extracardiac findings will be described separately under dictation for contemporaneously obtained CTA chest, abdomen and pelvis.  IMPRESSION: Please see separate dictation for contemporaneously obtained CTA chest, abdomen and pelvis dated 10/19/2022 for full description of relevant extracardiac findings.  Electronically Signed: By: Allegra Lai M.D. On: 10/19/2022 12:08          EKG:  EKG is  ordered today.  The ekg ordered today demonstrates afib with LBBB, HR 86  Recent Labs: 02/07/2022: NT-Pro BNP 667 10/07/2022: TSH 2.32 11/11/2022: ALT 16 11/16/2022: Magnesium 2.1 11/17/2022: BUN 16; Creatinine, Ser 0.97; Hemoglobin 11.7; Platelets 103; Potassium 4.6; Sodium 134  Recent Lipid Panel    Component Value Date/Time   CHOL 170 10/25/2021 0000   CHOL 158 12/20/2017 0000   TRIG 74 10/25/2021 0000    TRIG 95 12/20/2017 0000   HDL 70 10/25/2021 0000   CHOLHDL 2 11/23/2020 1222   VLDL 13.2 11/23/2020 1222   LDLCALC 85 10/25/2021 0000   LDLCALC 74 12/20/2017 0000   LDLDIRECT 88 07/27/2020 1144     Risk Assessment/Calculations:    CHA2DS2-VASc Score = 6   This indicates a 9.7% annual risk of stroke. The patient's score is based upon: CHF History: 0 HTN History: 1 Diabetes History: 0 Stroke History: 2 Vascular Disease History: 1 Age Score: 2 Gender Score: 0           Physical Exam:    VS:  BP 124/72   Pulse 86   Ht 5\' 10"  (1.778 m)   Wt 177 lb 9.6 oz (80.6 kg)   SpO2 96%   BMI 25.48 kg/m  Wt Readings from Last 3 Encounters:  11/25/22 177 lb 9.6 oz (80.6 kg)  11/17/22 180 lb 1.9 oz (81.7 kg)  11/11/22 183 lb (83 kg)     GEN:  Well nourished, well developed in no acute distress HEENT: Normal NECK: No JVD LYMPHATICS: No lymphadenopathy CARDIAC: irreg irreg, no murmurs, rubs, gallops RESPIRATORY:  Clear to auscultation without rales, wheezing or rhonchi  ABDOMEN: Soft, non-tender, non-distended MUSCULOSKELETAL:  No edema; No deformity SKIN: Warm and dry. Right groin with an acorn sized lump where catheter was placed. No bruit. Non tender.  NEUROLOGIC:  Alert and oriented x 3 PSYCHIATRIC:  Normal affect   ASSESSMENT:    1. S/P TAVR (transcatheter aortic valve replacement)   2. Groin lump   3. Transient CHB (complete heart block) (HCC)   4. Dizziness   5. Permanent atrial fibrillation (HCC)   6. Hypertension, unspecified type   7. Coronary artery disease involving native coronary artery of native heart without angina pectoris   8. Thoracic aortic aneurysm without rupture, unspecified part (HCC)   9. At risk for amyloid heart disease (HCC)    PLAN:    In order of problems listed above:  Severe AS s/p TAVR: doing okay 1 week out from TAVR. He still has multiple complaints, mostly of dizziness which he feels is worse than prior to surgery. He has an  acorn sized lump in groin where catheter was placed. No bruit. Non tender. This is uncomfortable to him. Will get a groin [place]. SBE prophylaxis discussed; I have RX'd amoxicillin. Continue Eliquis. I will see him back for 1 month follow up.  Transient CHB: wearing Zio AT, which I interrogated and there has been no HAVB.   Dizziness: this has been ongoing since an occipital stroke last year. He has had worsening of this since TAVR. This is his primary complaint. I have reviewed extensive notes about this. It did not improve with holding his diuretic, changing dosage of Cardizem or fixing his aortic valve. Carotid dopplers 1-39% 04/2021 with repeat dopplers scheduled next month. He has not had any cardiac arrhythmias that explain the ongoing dizziness. I do not believe this is cardiac in nature and more likely related to his neurologic event. I have recommended making an apt with his ENT to rule out vestibular causes and his neurologist.    Persistent atrial fibrillation: HRs have been stable off Cardizem CD. Will wait for final report of Zio patch to decide if he needs resumption of an AV nodal blocking agent.    HTN: BP elevated while admitted and home Cardizem CD discontinued. He was started on Norvasc 10mg  daily but he has had worsening ankle/feet edema. I have asked him to stop Novasc and restart hydrochlorothiazide/potassium (which was previously discontinued to see if it helped dizziness). BP under good control today.    CAD s/p CABG: pre TAVR cath showed patent bypass grafts. Continue medical therapy.    TAA (Ascending thoracic aortic aneurysm): pre TAVR CT scans showed a dilated ascending thoracic aorta, measuring up to 4.2 cm. Recommend annual imaging followup by CTA or MRA.    Elevated Raise Score: 3 (age and RBBB). Also has a history of polyneuropathy. He would like to hold off on testing for now.    Medication Adjustments/Labs and Tests Ordered: Current medicines are reviewed at length with  the patient today.  Concerns regarding medicines are outlined above.  Orders Placed This Encounter  Procedures   EKG 12-Lead   VAS [place] GROIN PSEUDOANEURYSM  Meds ordered this encounter  Medications   amoxicillin (AMOXIL) 500 MG tablet    Sig: Take 4 tablets (2,000 mg total) by mouth as directed. 1 hour prior to dental work including cleanings    Dispense:  12 tablet    Refill:  12    Order Specific Question:   Supervising Provider    Answer:   Tonny Bollman [[id number]]    Patient Instructions  Medication Instructions: Your physician recommends that you continue on your current medications as directed. Please refer to the Current Medication list given to you today.  *If you need a refill on your cardiac medications before your next appointment, please call your pharmacy*   Lab Work: None ordered   If you have labs (blood work) drawn today and your tests are completely normal, you will receive your results only by: MyChart Message (if you have MyChart) OR A paper copy in the mail If you have any lab test that is abnormal or we need to change your treatment, we will call you to review the results.   Testing/Procedures: Your physician has requested that you have a ultrasound of your groin    Follow-Up: Follow up as scheduled   Other Instructions     Signed, Cline Crock, PA-C  11/28/2022 4:19 PM    [facility]

## 2022-11-27 ENCOUNTER — Encounter: Payer: Self-pay | Admitting: Cardiovascular Disease

## 2022-11-28 ENCOUNTER — Ambulatory Visit (HOSPITAL_COMMUNITY)
Admission: RE | Admit: 2022-11-28 | Discharge: 2022-11-28 | Disposition: A | Discharge status: Home / Self Care | Payer: Medicare Other | Source: Ambulatory Visit | Attending: Physician Assistant | Admitting: Physician Assistant

## 2022-11-28 ENCOUNTER — Telehealth: Payer: Self-pay | Admitting: Cardiovascular Disease

## 2022-11-28 DIAGNOSIS — R1909 Other intra-abdominal and pelvic swelling, mass and lump: Secondary | ICD-10-CM

## 2022-11-28 NOTE — Telephone Encounter (Signed)
Patient states he is unfamiliar with groin pseudianeurysm and he would like to know if he can drive himself to his appointment at 3:30 PM. Please advise.

## 2022-11-28 NOTE — Telephone Encounter (Signed)
Returned call to patient to inform him that he is safe to drive himself to the ultrasound today as long as it is not painful or uncomfortable for him to do so. Advised that test results will determine next steps.

## 2022-11-29 ENCOUNTER — Other Ambulatory Visit: Payer: Self-pay | Admitting: Physician Assistant

## 2022-11-29 DIAGNOSIS — Z952 Presence of prosthetic heart valve: Secondary | ICD-10-CM

## 2022-12-12 NOTE — Addendum Note (Signed)
Encounter addended by: Flavia Shipper on: 12/12/2022 12:16 PM  Actions taken: Imaging Exam ended

## 2022-12-15 ENCOUNTER — Ambulatory Visit: Payer: Medicare Other | Attending: Cardiovascular Disease | Admitting: Cardiovascular Disease

## 2022-12-15 ENCOUNTER — Encounter: Payer: Self-pay | Admitting: Cardiovascular Disease

## 2022-12-15 VITALS — BP 152/88 | HR 71 | Ht 70.0 in | Wt 181.0 lb

## 2022-12-15 DIAGNOSIS — Z952 Presence of prosthetic heart valve: Secondary | ICD-10-CM

## 2022-12-15 DIAGNOSIS — R42 Dizziness and giddiness: Secondary | ICD-10-CM

## 2022-12-15 NOTE — Patient Instructions (Signed)
Medication Instructions:  Your physician recommends that you continue on your current medications as directed. Please refer to the Current Medication list given to you today.  *If you need a refill on your cardiac medications before your next appointment, please call your pharmacy*   Lab Work: NONE If you have labs (blood work) drawn today and your tests are completely normal, you will receive your results only by: MyChart Message (if you have MyChart) OR A paper copy in the mail If you have any lab test that is abnormal or we need to change your treatment, we will call you to review the results.   Testing/Procedures: NONE (consulting with Lucia Gaskins for brain MRI)   Follow-Up: At [facility], you and your health needs are our priority.  As part of our continuing mission to provide you with exceptional heart care, we have created designated Provider Care Teams.  These Care Teams include your primary Cardiologist (physician) and Advanced Practice Providers (APPs -  Physician Assistants and Nurse Practitioners) who all work together to provide you with the care you need, when you need it.  Your next appointment:   As Scheduled  Provider:   Tonny Bollman, MD

## 2022-12-15 NOTE — Progress Notes (Signed)
Cardiology Office Note:    Date:  12/19/2022   ID:  Tayshaun, Parziale [birth year], MRN [id number]  PCP:  Quintella Reichert, MD   [facility] Providers Cardiologist:  [name], MD     Referring MD: Quintella Reichert, MD   Chief Complaint  Patient presents with   Dizziness    History of Present Illness:    [name] [name] is a 88 y.o. male with a hx of CAD s/p CABG (2004 EBG), HTN, carotid artery disease s/p CEA (06/2020), ventricular tachycardia, permanent atrial fibrillation on Eliquis, stroke and severe aortic stenosis s/p TAVR (11/15/22) who presents to clinic for follow up.   The patient is here with his son today.  He continues to have a lot of dizziness and is frustrated with this.  He has had problems with dizziness ever since an occipital stroke in 2021.  He was hopeful that TAVR would have improved his dizziness.  He actually thinks it is worse since he has had the procedure than it was before hand.  He describes significant dizziness with turning his head in certain directions.  He has undergone physical therapy with Dix-Hallpike maneuvers but this has not helped in the past.  He plans to try this again and is scheduled for physical therapy in the near future.  He denies presyncope or frank syncope.  He denies chest pain or shortness of breath.  Past Medical History:  Diagnosis Date   Arthritis    Atrial fibrillation, permanent (HCC)    Bladder neoplasm    Chronic cough    NO CARDIAC OR PULMONARY RELATED   Coronary artery disease CARDIOLOGIST-  DR KLEIN/ [facility]   a. s/p CABG 2004, b. LHC with patent grafts 07/2005, ejection fraction 50%;  c. 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 Excell Seltzer & Leafy Ro.   Severe aortic stenosis     Past Surgical History:  Procedure Laterality Date   CARDIAC CATHETERIZATION  07-26-2005  DR GAMBLE   PRESERVED LVF/  EF 50%/ PATENT GRAFTS   CARDIAC CATHETERIZATION  03-04-2003  DR GAMBLE   SEVERE 3 VESSEL DISEASE   CARDIAC CATHETERIZATION  1991  DR [facility]   PAF/ FALSE POSITIVE STRESS TEST   CAROTID ENDARTERECTOMY Left 2022   CATARACT EXTRACTION W/ INTRAOCULAR LENS IMPLANT Right    CORONARY ARTERY BYPASS GRAFT  03-08-2003  DR [id number]   LIMA TO LAD/ SVG TO OM2/  SVG TO DIAGONAL / SVG TO PDA & PLA   CYSTOSCOPY WITH BIOPSY N/A 12/25/2012   Procedure: CYSTOSCOPY WITH BLADDER BIOPSY;  Surgeon: Antony Haste, MD;  Location: [facility];  Service: Urology;  Laterality: N/A;   ELECTROPHYSIOLOGY STUDY  07-27-2005  DR Sharlot Gowda TAYLOR   MAPPING --   RESULT NONINDUCIBLE VT OR SVT/  DX RIGHT VENTRICULAR OUTFLOW TRACT VT AND RULES OUT MORE MALIGNANT CAUSES OF VT   ENDARTERECTOMY  Left 07/08/2020   Procedure: LEFT CAROTID ENDARTERECTOMY;  Surgeon: Nada Libman, MD;  Location: [facility] OR;  Service: Vascular;  Laterality: Left;   INGUINAL HERNIA REPAIR Bilateral 1954  &  1990   INTRAOPERATIVE TRANSTHORACIC ECHOCARDIOGRAM N/A 11/15/2022   Procedure: INTRAOPERATIVE TRANSTHORACIC ECHOCARDIOGRAM;  Surgeon: Tonny Bollman, MD;  Location[facility] INVASIVE CV LAB;  Service: Open Heart Surgery;  Laterality: N/A;   KNEE ARTHROSCOPY Right 1994   LEFT HEART CATH AND CORS/GRAFTS ANGIOGRAPHY N/A 10/14/2022   Procedure: LEFT HEART CATH AND CORS/GRAFTS ANGIOGRAPHY;  Surgeon: Tonny Bollman, MD;  Location: [facility] INVASIVE CV LAB;  Service: Cardiovascular;  Laterality: N/A;   LUMBAR DISC SURGERY  1999   L4 -- L5   MOHS  SURGERY     by Dr. Jeanella Craze 2019   REMOVAL VOCAL CORD POLYPS  1978   TONSILLECTOMY  AS CHILD   TRANSCATHETER AORTIC VALVE REPLACEMENT, TRANSFEMORAL N/A 11/15/2022   Procedure: Transcatheter Aortic Valve Replacement, Transfemoral;  Surgeon: Tonny Bollman, MD;  Location: [facility] INVASIVE CV LAB;  Service: Open Heart Surgery;  Laterality: N/A;   TRANSTHORACIC ECHOCARDIOGRAM  08/08/2011   MILD LVH/  EF 55-60%/  GRADE I DIASTOLIC DYSFUNCTION/ MILD AV STENOSIS    Current Medications: Current Meds  Medication Sig   acetaminophen (TYLENOL) 325 MG tablet Take 650 mg by mouth once as needed for moderate pain or headache.   amoxicillin (AMOXIL) 500 MG tablet Take 4 tablets (2,000 mg total) by mouth as directed. 1 hour prior to dental work including cleanings   apixaban (ELIQUIS) 5 MG TABS tablet Take 1 tablet (5 mg total) by mouth 2 (two) times daily. Take evening dose starting 07/09/20   butalbital-acetaminophen-caffeine (FIORICET, ESGIC) 50-325-40 MG per tablet Take 1 tablet by mouth 3 (three) times daily as needed for migraine.    cetirizine (ZYRTEC) 10 MG tablet Take 1 tablet (10 mg total) by mouth daily.   fluticasone (FLONASE) 50 MCG/ACT nasal spray Place 1 spray into both nostrils daily. (Patient taking differently: Place 1 spray into both nostrils daily as needed for allergies.)   hydrochlorothiazide (HYDRODIURIL) 25 MG tablet Take 25 mg by mouth daily.   latanoprost (XALATAN) 0.005 % ophthalmic solution Place 1 drop into both eyes at bedtime.   melatonin 3 MG TABS tablet Take 3 mg by mouth at bedtime as needed (Sleep).   Multiple Vitamins-Minerals (MULTIVITAMIN WITH MINERALS) tablet Take 1 tablet by mouth daily.   Polyethyl Glycol-Propyl Glycol (SYSTANE ULTRA OP) Place 1-2 drops into both eyes once as needed (dry eyes.).   potassium chloride (KLOR-CON M) 10 MEQ tablet Take 10 mEq by mouth daily.   pravastatin (PRAVACHOL) 80 MG tablet Take 1 tablet (80 mg total) by mouth every evening.      Allergies:   Ace inhibitors, Zocor [simvastatin], Nsaids, Prednisone, Requip [ropinirole], Cephalexin, Chocolate, Doxycycline, Gabapentin, Hycodan [hydrocodone bit-homatrop mbr], and Oxycodone   Social History   Socioeconomic History   Marital status: Married    Spouse name: [name]   Number of children: 3   Years of education: Not on file   Highest education level: Not on file  Occupational History   Occupation: Retired  Tobacco Use   Smoking status: Former    Current packs/day: 0.00    Average packs/day: 1 pack/day for 25.0 years (25.0 ttl pk-yrs)    Types: Cigarettes    Start date: 04/26/1955    Quit date: 04/25/1980    Years since quitting: 42.6   Smokeless tobacco: Never  Vaping Use   Vaping  status: Never Used  Substance and Sexual Activity   Alcohol use: No    Alcohol/week: 0.0 standard drinks of alcohol   Drug use: No   Sexual activity: Not on file  Other Topics Concern   Not on file  Social History Narrative   [employer] '56-'58, overseas to [place]   Some care through [place]   No service disability   Social Determinants of Health   Financial Resource Strain: Low Risk  (10/22/2021)   Overall Financial Resource Strain (CARDIA)    Difficulty of Paying Living Expenses: Not hard at all  Food Insecurity: No Food Insecurity (11/18/2022)   Hunger Vital Sign    Worried About Running Out of Food in the Last Year: Never true    Ran Out of Food in the Last Year: Never true  Transportation Needs: No Transportation Needs (11/18/2022)   PRAPARE - Administrator, Civil Service (Medical): No    Lack of Transportation (Non-Medical): No  Physical Activity: Inactive (10/22/2021)   Exercise Vital Sign    Days of Exercise per Week: 0 days    Minutes of Exercise per Session: 0 min  Stress: No Stress Concern Present (10/22/2021)   [employer] of Occupational Health - Occupational Stress Questionnaire    Feeling of Stress : Not at all  Social Connections: Unknown (09/06/2021)    [facility] from [facility]   Social Network    Social Network: Not on file     Family History: The patient's family history includes Cancer in his mother. There is no history of Stroke.  ROS:   Please see the history of present illness.    All other systems reviewed and are negative.  EKGs/Labs/Other Studies Reviewed:    The following studies were reviewed today: Echo 11/16/2022: 1. Left ventricular ejection fraction, by estimation, is 60 to 65%. The  left ventricle has normal function. The left ventricle has no regional  wall motion abnormalities. There is mild concentric left ventricular  hypertrophy. Left ventricular diastolic  function could not be evaluated.   2. Right ventricular systolic function is low normal. The right  ventricular size is normal. There is mildly elevated pulmonary artery  systolic pressure. The estimated right ventricular systolic pressure is  38.8 mmHg.   3. Left atrial size was severely dilated.   4. Right atrial size was moderately dilated.   5. The mitral valve is degenerative. Mild mitral valve regurgitation. No  evidence of mitral stenosis. Moderate mitral annular calcification.   6. Tricuspid valve regurgitation is mild to moderate.   7. The aortic valve has been repaired/replaced. Aortic valve  regurgitation is not visualized. There is a 29 mm Edwards Sapien  prosthetic (TAVR) valve present in the aortic position. Procedure Date:  11/15/22. Echo findings are consistent with normal  structure and function of the aortic valve prosthesis. Aortic valve mean  gradient measures 5.0 mmHg. Aortic valve Vmax measures 1.61 m/s. Aortic  valve acceleration time measures 74 msec.   8. There is moderate dilatation of the ascending aorta, measuring 45 mm.   9. The inferior vena cava is dilated in size with <50% respiratory  variability, suggesting right atrial pressure of 15 mmHg.        Recent Labs: 02/07/2022: NT-Pro BNP 667 10/07/2022:  TSH 2.32 11/11/2022: ALT 16 11/16/2022: Magnesium 2.1 11/17/2022: BUN 16; Creatinine, Ser 0.97; Hemoglobin 11.7; Platelets 103; Potassium 4.6; Sodium 134  Recent Lipid Panel    Component Value Date/Time   CHOL 170  10/25/2021 0000   CHOL 158 12/20/2017 0000   TRIG 74 10/25/2021 0000   TRIG 95 12/20/2017 0000   HDL 70 10/25/2021 0000   CHOLHDL 2 11/23/2020 1222   VLDL 13.2 11/23/2020 1222   LDLCALC 85 10/25/2021 0000   LDLCALC 74 12/20/2017 0000   LDLDIRECT 88 07/27/2020 1144     Risk Assessment/Calculations:          Physical Exam:    VS:  BP (!) 152/88   Pulse 71   Ht 5\' 10"  (1.778 m)   Wt 181 lb (82.1 kg)   SpO2 95%   BMI 25.97 kg/m     Wt Readings from Last 3 Encounters:  12/15/22 181 lb (82.1 kg)  11/25/22 177 lb 9.6 oz (80.6 kg)  11/17/22 180 lb 1.9 oz (81.7 kg)     GEN:  Well nourished, well developed in no acute distress HEENT: Normal NECK: No JVD; No carotid bruits LYMPHATICS: No lymphadenopathy CARDIAC: RRR, no murmurs, rubs, gallops RESPIRATORY:  Clear to auscultation without rales, wheezing or rhonchi  ABDOMEN: Soft, non-tender, non-distended MUSCULOSKELETAL:  No edema; No deformity  SKIN: Warm and dry NEUROLOGIC:  Alert and oriented x 3 PSYCHIATRIC:  Normal affect   ASSESSMENT:    1. S/P TAVR (transcatheter aortic valve replacement)   2. Dizziness    PLAN:    In order of problems listed above:  Normal TAVR valve function identified on his postoperative echo.  He is scheduled for his 30-day study and follow-up office visit.  He should continue current medical therapy which includes apixaban for anticoagulation he should follow SBE prophylaxis. He has NYHA class II symptoms. Difficult situation.  Patient seems to be extremely limited from his dizziness which appears to be more vestibular in nature.  However, he does note worsening since undergoing TAVR.  I am going to reach out to Dr. Lucia Gaskins as it might be appropriate to order an updated brain MRI.   Patient is going for physical therapy in case vestibular symptoms are playing a role.  Otherwise he will follow-up with neurology.           Medication Adjustments/Labs and Tests Ordered: Current medicines are reviewed at length with the patient today.  Concerns regarding medicines are outlined above.  No orders of the defined types were placed in this encounter.  No orders of the defined types were placed in this encounter.   Patient Instructions  Medication Instructions:  Your physician recommends that you continue on your current medications as directed. Please refer to the Current Medication list given to you today.  *If you need a refill on your cardiac medications before your next appointment, please call your pharmacy*   Lab Work: NONE If you have labs (blood work) drawn today and your tests are completely normal, you will receive your results only by: MyChart Message (if you have MyChart) OR A paper copy in the mail If you have any lab test that is abnormal or we need to change your treatment, we will call you to review the results.   Testing/Procedures: NONE (consulting with Lucia Gaskins for brain MRI)   Follow-Up: At [facility], you and your health needs are our priority.  As part of our continuing mission to provide you with exceptional heart care, we have created designated Provider Care Teams.  These Care Teams include your primary Cardiologist (physician) and Advanced Practice Providers (APPs -  Physician Assistants and Nurse Practitioners) who all work together to provide you with the  care you need, when you need it.  Your next appointment:   As Scheduled  Provider:   Tonny Bollman, MD      Signed, Tonny Bollman, MD  12/19/2022 12:51 PM    [facility]

## 2022-12-16 ENCOUNTER — Encounter (INDEPENDENT_AMBULATORY_CARE_PROVIDER_SITE_OTHER): Payer: Medicare Other | Admitting: Ophthalmology

## 2022-12-16 DIAGNOSIS — H35033 Hypertensive retinopathy, bilateral: Secondary | ICD-10-CM | POA: Diagnosis not present

## 2022-12-16 DIAGNOSIS — H34232 Retinal artery branch occlusion, left eye: Secondary | ICD-10-CM

## 2022-12-16 DIAGNOSIS — I10 Essential (primary) hypertension: Secondary | ICD-10-CM | POA: Diagnosis not present

## 2022-12-16 DIAGNOSIS — H348322 Tributary (branch) retinal vein occlusion, left eye, stable: Secondary | ICD-10-CM | POA: Diagnosis not present

## 2022-12-16 DIAGNOSIS — H43813 Vitreous degeneration, bilateral: Secondary | ICD-10-CM

## 2022-12-19 ENCOUNTER — Telehealth: Payer: Self-pay | Admitting: Cardiology

## 2022-12-19 NOTE — Telephone Encounter (Signed)
Verbal orders left on secure vm.

## 2022-12-19 NOTE — Telephone Encounter (Signed)
Home Health verbal orders Caller Name: Enrique Sack Agency Name: [facility]  Callback number: [phone]  Requesting eval for [facility]   Reason: Vestibular rehab therapy for dizziness   Please forward to [facility] pool or providers CMA

## 2022-12-19 NOTE — Telephone Encounter (Signed)
Please give the order.  Thanks.   

## 2022-12-20 ENCOUNTER — Ambulatory Visit (HOSPITAL_COMMUNITY): Payer: Medicare Other | Attending: Cardiology

## 2022-12-20 ENCOUNTER — Ambulatory Visit: Payer: Medicare Other | Admitting: Cardiovascular Disease

## 2022-12-20 ENCOUNTER — Encounter: Payer: Self-pay | Admitting: Neurology

## 2022-12-20 DIAGNOSIS — Z952 Presence of prosthetic heart valve: Secondary | ICD-10-CM | POA: Diagnosis not present

## 2022-12-20 LAB — ECHOCARDIOGRAM COMPLETE
AR max vel: 1.37 cm2
AV Area VTI: 1.43 cm2
AV Area mean vel: 1.39 cm2
AV Mean grad: 8 mmHg
AV Peak grad: 14 mmHg
Ao pk vel: 1.87 m/s
Area-P 1/2: 4.2 cm2
P 1/2 time: 271 msec
S' Lateral: 3 cm

## 2022-12-21 NOTE — Telephone Encounter (Signed)
medicare/[employer] NPR sent to GI [phone] 

## 2022-12-21 NOTE — Addendum Note (Signed)
Addended by: Naomie Dean B on: 12/21/2022 11:23 AM   Modules accepted: Orders

## 2022-12-23 ENCOUNTER — Other Ambulatory Visit (HOSPITAL_COMMUNITY): Payer: Medicare Other

## 2022-12-23 ENCOUNTER — Ambulatory Visit: Payer: Medicare Other

## 2023-01-02 ENCOUNTER — Ambulatory Visit (INDEPENDENT_AMBULATORY_CARE_PROVIDER_SITE_OTHER): Payer: Medicare Other | Admitting: Physician Assistant

## 2023-01-02 ENCOUNTER — Ambulatory Visit (HOSPITAL_COMMUNITY)
Admission: RE | Admit: 2023-01-02 | Discharge: 2023-01-02 | Disposition: A | Discharge status: Home / Self Care | Payer: Medicare Other | Source: Ambulatory Visit | Attending: Physician Assistant | Admitting: Physician Assistant

## 2023-01-02 VITALS — BP 183/88 | HR 81 | Temp 98.2°F | Resp 18 | Ht 70.0 in | Wt 179.9 lb

## 2023-01-02 DIAGNOSIS — I6522 Occlusion and stenosis of left carotid artery: Secondary | ICD-10-CM | POA: Insufficient documentation

## 2023-01-02 NOTE — Progress Notes (Signed)
History of Present Illness:  Patient is a 87 y.o. year old male who presents for evaluation of carotid stenosis.  He is s/p left CEA (Date: 07/08/20).  This was considered a symptomatic stenosis as he had L eye vision changes and retinal artery occlusion. The patient denies symptoms of TIA, amaurosis, or stroke.     Of note he is 2 months s/p Transcatheter Aortic Valve Replacement - Percutaneous right Transfemoral Approach.  Post op he developed a pseudoaneurysm in the right groin.  This has resolved with time.  He has right thigh "buzzing" sensations when he trying to get to sleep at night.  He also has history of right Knee OA that bothers him.    He is medically managed on a daily Statin and  Eliquis for Afib.       Past Medical History:  Diagnosis Date   Arthritis    Atrial fibrillation, permanent (HCC)    Bladder neoplasm    Chronic cough    NO CARDIAC OR PULMONARY RELATED   Coronary artery disease CARDIOLOGIST-  DR KLEIN/ ALLRED   a. s/p CABG 2004, b. LHC with patent grafts 07/2005, ejection fraction 50%;  c. 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 Excell Seltzer & Leafy Ro.   Severe aortic stenosis     Past Surgical History:  Procedure Laterality Date   CARDIAC CATHETERIZATION  07-26-2005  DR GAMBLE   PRESERVED LVF/  EF 50%/ PATENT GRAFTS   CARDIAC CATHETERIZATION  03-04-2003  DR GAMBLE   SEVERE 3 VESSEL DISEASE   CARDIAC CATHETERIZATION  1991  DR [facility]   PAF/ FALSE  POSITIVE STRESS TEST   CAROTID ENDARTERECTOMY Left 2022   CATARACT EXTRACTION W/ INTRAOCULAR LENS IMPLANT Right    CORONARY ARTERY BYPASS GRAFT  03-08-2003  DR [id number]   LIMA TO LAD/ SVG TO OM2/  SVG TO DIAGONAL / SVG TO PDA & PLA   CYSTOSCOPY WITH BIOPSY N/A 12/25/2012   Procedure: CYSTOSCOPY WITH BLADDER BIOPSY;  Surgeon: Antony Haste, MD;  Location: [facility];  Service: Urology;  Laterality: N/A;   ELECTROPHYSIOLOGY STUDY  07-27-2005  DR Lewayne Bunting   MAPPING --   RESULT NONINDUCIBLE VT OR SVT/  DX RIGHT VENTRICULAR OUTFLOW TRACT VT AND RULES OUT MORE MALIGNANT CAUSES OF VT   ENDARTERECTOMY Left 07/08/2020   Procedure: LEFT CAROTID ENDARTERECTOMY;  Surgeon: Nada Libman, MD;  Location: [facility] OR;  Service: Vascular;  Laterality: Left;   INGUINAL HERNIA REPAIR Bilateral 1954  &  1990   INTRAOPERATIVE TRANSTHORACIC ECHOCARDIOGRAM N/A 11/15/2022   Procedure: INTRAOPERATIVE TRANSTHORACIC ECHOCARDIOGRAM;  Surgeon: Tonny Bollman, MD;  Location: [facility] INVASIVE CV LAB;  Service: Open Heart Surgery;  Laterality: N/A;   KNEE ARTHROSCOPY Right 1994   LEFT HEART CATH AND CORS/GRAFTS ANGIOGRAPHY N/A 10/14/2022   Procedure: LEFT HEART CATH AND CORS/GRAFTS ANGIOGRAPHY;  Surgeon: Tonny Bollman, MD;  Location:  [facility] INVASIVE CV LAB;  Service: Cardiovascular;  Laterality: N/A;   LUMBAR DISC SURGERY  1999   L4 -- L5   MOHS SURGERY     by Dr. Jeanella Craze 2019   REMOVAL VOCAL CORD POLYPS  1978   TONSILLECTOMY  AS CHILD   TRANSCATHETER AORTIC VALVE REPLACEMENT, TRANSFEMORAL N/A 11/15/2022   Procedure: Transcatheter Aortic Valve Replacement, Transfemoral;  Surgeon: Tonny Bollman, MD;  Location: [facility] INVASIVE CV LAB;  Service: Open Heart Surgery;  Laterality: N/A;   TRANSTHORACIC ECHOCARDIOGRAM  08/08/2011   MILD LVH/  EF 55-60%/  GRADE I DIASTOLIC DYSFUNCTION/ MILD AV STENOSIS     Social History Social History   Tobacco Use   Smoking status: Former    Current packs/day: 0.00     Average packs/day: 1 pack/day for 25.0 years (25.0 ttl pk-yrs)    Types: Cigarettes    Start date: 04/26/1955    Quit date: 04/25/1980    Years since quitting: 42.7   Smokeless tobacco: Never  Vaping Use   Vaping status: Never Used  Substance Use Topics   Alcohol use: No    Alcohol/week: 0.0 standard drinks of alcohol   Drug use: No    Family History Family History  Problem Relation Age of Onset   Cancer Mother    Stroke Neg Hx     Allergies  Allergies  Allergen Reactions   Ace Inhibitors Other (See Comments)    COUGH   Zocor [Simvastatin] Other (See Comments)    MYALGIA   Nsaids Other (See Comments)    Would avoid given anticoagulation   Prednisone Other (See Comments)    Prednisone caused pt to have elevated BP.   Requip [Ropinirole]     didn't sleep well and had trouble with abnormal dreams   Cephalexin Rash   Chocolate Other (See Comments)    Headaches    Doxycycline Other (See Comments)    Nausea and abd pain   Gabapentin Other (See Comments)    Dizziness   Hycodan [Hydrocodone Bit-Homatrop Mbr] Nausea And Vomiting   Oxycodone Nausea Only    Pt states that he cannot take prescription pain medication     Current Outpatient Medications  Medication Sig Dispense Refill   acetaminophen (TYLENOL) 325 MG tablet Take 650 mg by mouth once as needed for moderate pain or headache.     amoxicillin (AMOXIL) 500 MG tablet Take 4 tablets (2,000 mg total) by mouth as directed. 1 hour prior to dental work including cleanings 12 tablet 12   apixaban (ELIQUIS) 5 MG TABS tablet Take 1 tablet (5 mg total) by mouth 2 (two) times daily. Take evening dose starting 07/09/20 180 tablet 3   butalbital-acetaminophen-caffeine (FIORICET, ESGIC) 50-325-40 MG per tablet Take 1 tablet by mouth 3 (three) times daily as needed for migraine.      cetirizine (ZYRTEC) 10 MG tablet Take 1 tablet (10 mg total) by mouth daily.     fluticasone (FLONASE) 50 MCG/ACT nasal spray Place 1 spray into both  nostrils daily. (Patient taking differently: Place 1 spray into both nostrils daily as needed for allergies.) 16 g 1   hydrochlorothiazide (HYDRODIURIL) 25 MG tablet Take 25 mg by mouth daily.     latanoprost (XALATAN) 0.005 % ophthalmic solution Place 1 drop into both eyes at bedtime.     melatonin 3 MG TABS tablet Take 3 mg by mouth at bedtime as needed (Sleep).     Multiple Vitamins-Minerals (MULTIVITAMIN WITH MINERALS) tablet Take 1 tablet by mouth daily.  Polyethyl Glycol-Propyl Glycol (SYSTANE ULTRA OP) Place 1-2 drops into both eyes once as needed (dry eyes.).     potassium chloride (KLOR-CON M) 10 MEQ tablet Take 10 mEq by mouth daily.     pravastatin (PRAVACHOL) 80 MG tablet Take 1 tablet (80 mg total) by mouth every evening. 90 tablet 0   No current facility-administered medications for this visit.    ROS:   General:  No weight loss, Fever, chills  HEENT: No recent headaches, no nasal bleeding, no visual changes, no sore throat  Neurologic: No dizziness, blackouts, seizures. No recent symptoms of stroke or mini- stroke. No recent episodes of slurred speech, or temporary blindness.  Cardiac: No recent episodes of chest pain/pressure, no shortness of breath at rest.  No shortness of breath with exertion.  Denies history of atrial fibrillation or irregular heartbeat  Vascular: No history of rest pain in feet.  No history of claudication.  No history of non-healing ulcer, No history of DVT   Pulmonary: No home oxygen, no productive cough, no hemoptysis,  No asthma or wheezing  Musculoskeletal:  [x ] Arthritis, [ x] Low back pain,  [x ] Joint pain  Hematologic:No history of hypercoagulable state.  No history of easy bleeding.  No history of anemia  Gastrointestinal: No hematochezia or melena,  No gastroesophageal reflux, no trouble swallowing  Urinary: [ ]  chronic Kidney disease, [ ]  on HD - [ ]  MWF or [ ]  TTHS, [ ]  Burning with urination, [ ]  Frequent urination, [ ]   Difficulty urinating;   Skin: No rashes  Psychological: No history of anxiety,  No history of depression   Physical Examination  Vitals:   01/02/23 1229 01/02/23 1230  BP: (!) 182/93 (!) 183/88  Pulse: 81   Resp: 18   Temp: 98.2 F (36.8 C)   TempSrc: Temporal   SpO2: 96%   Weight: 179 lb 14.4 oz (81.6 kg)   Height: 5\' 10"  (1.778 m)     Body mass index is 25.81 kg/m.  General:  Alert and oriented, no acute distress HEENT: Normal Neck: No bruit or JVD Pulmonary: Clear to auscultation bilaterally Cardiac: Regular Rate and Rhythm without murmur Gastrointestinal: Soft, non-tender, non-distended, no mass, no scars Skin: No rash Extremity Pulses:   radial,  femoral, dorsalis pedis,pulses bilaterally Musculoskeletal: No deformity or edema.  Right thigh is soft and he has an easily palpable femoral pulse without residual hematoma.    Neurologic: Upper and lower extremity motor 5/5 and symmetric  DATA:  Carotid Arterial Duplex Study  Patient Name:  [name] [name] [facility]  Date of Exam:   01/02/2023 Medical Rec #: [id number]            Accession #:    [id number] Date of Birth: 12-25-1934            Patient Gender: M Patient Age:   78 years Exam Location:  [facility] Procedure:      VAS US CAROTID Referring Phys: [facility]   --------------------------------------------------------------------------- -----   Indications:       Carotid artery disease, left endarterectomy and 07/08/20 Left                    CEA. Risk Factors:      Hypertension, hyperlipidemia, past history of smoking,                    coronary artery disease, prior CVA. Other Factors:     S/P CABG  x 5, S/P TAVR. Comparison Study:  05/24/21 Carotid Duplex  Performing Technologist: Lowell Guitar RVT, RDMS    Examination Guidelines: A complete evaluation includes B-mode imaging, spectral Doppler, color Doppler, and power Doppler as needed of all accessible portions of each vessel.  Bilateral testing is considered an integral part of a complete examination. Limited examinations for reoccurring indications may be performed as noted.    Right Carotid Findings: +----------+--------+--------+--------+------------------+--------+           PSV cm/sEDV cm/sStenosisPlaque DescriptionComments +----------+--------+--------+--------+------------------+--------+ CCA Prox  55      12                                         +----------+--------+--------+--------+------------------+--------+ CCA Mid   59      9                                          +----------+--------+--------+--------+------------------+--------+ CCA Distal48      14      <50%    heterogenous               +----------+--------+--------+--------+------------------+--------+ ICA Prox  45      10      1-39%   heterogenous               +----------+--------+--------+--------+------------------+--------+ ICA Mid   43      14                                         +----------+--------+--------+--------+------------------+--------+ ICA Distal79      17                                tortuous +----------+--------+--------+--------+------------------+--------+ ECA       47      11                                         +----------+--------+--------+--------+------------------+--------+  +----------+--------+-------+----------------+-------------------+           PSV cm/sEDV cmsDescribe        Arm Pressure (mmHG) +----------+--------+-------+----------------+-------------------+ [id number]             Multiphasic, [id number]                 +----------+--------+-------+----------------+-------------------+  +---------+--------+--+--------+-+---------+ VertebralPSV cm/s37EDV cm/s9Antegrade +---------+--------+--+--------+-+---------+     Left Carotid Findings: +----------+--------+--------+--------+------------------+--------+            PSV cm/sEDV cm/sStenosisPlaque DescriptionComments +----------+--------+--------+--------+------------------+--------+ CCA Prox  116     19                                         +----------+--------+--------+--------+------------------+--------+ CCA Mid   98      22      <50%    heterogenous               +----------+--------+--------+--------+------------------+--------+ CCA Distal74      20      <50%    heterogenous               +----------+--------+--------+--------+------------------+--------+  ICA Prox  50      14      1-39%   heterogenous               +----------+--------+--------+--------+------------------+--------+ ICA Mid   100     31                                         +----------+--------+--------+--------+------------------+--------+ ICA Distal80      30                                         +----------+--------+--------+--------+------------------+--------+ ECA       68      13                                         +----------+--------+--------+--------+------------------+--------+  +----------+--------+--------+----------------+-------------------+           PSV cm/sEDV cm/sDescribe        Arm Pressure (mmHG) +----------+--------+--------+----------------+-------------------+ Subclavian131             Multiphasic, [id number]                 +----------+--------+--------+----------------+-------------------+  +---------+--------+--+--------+--+---------+ VertebralPSV cm/s45EDV cm/s11Antegrade +---------+--------+--+--------+--+---------+          ASSESSMENT/PLAN: Carotid stenosis s/p left CEA (Date: 07/08/20).  This was considered a symptomatic stenosis as he had L eye vision changes and retinal artery occlusion. The patient denies symptoms of TIA, amaurosis, or stroke.   His duplex shows B ICA < 39% stenosis.  I showed him core exercises to  do with hopes of improvement.  He will f/u in  1 year for repeat surveillance.         Mosetta Pigeon PA-C [facility] Office: [phone]  MD in clinic [place]

## 2023-01-05 ENCOUNTER — Ambulatory Visit
Admission: RE | Admit: 2023-01-05 | Discharge: 2023-01-05 | Disposition: A | Discharge status: Home / Self Care | Payer: Medicare Other | Source: Ambulatory Visit | Attending: Neurology | Admitting: Neurology

## 2023-01-05 DIAGNOSIS — R42 Dizziness and giddiness: Secondary | ICD-10-CM

## 2023-01-05 DIAGNOSIS — I639 Cerebral infarction, unspecified: Secondary | ICD-10-CM | POA: Diagnosis not present

## 2023-01-05 DIAGNOSIS — C8599 Non-Hodgkin lymphoma, unspecified, extranodal and solid organ sites: Secondary | ICD-10-CM | POA: Diagnosis not present

## 2023-01-05 MED ORDER — GADOPICLENOL 0.5 MMOL/ML IV SOLN
7.5000 mL | Freq: Once | INTRAVENOUS | Status: AC | PRN
  Administered 2023-01-05: 7.5 mL via INTRAVENOUS

## 2023-01-09 ENCOUNTER — Encounter: Payer: Self-pay | Admitting: Physical Therapy

## 2023-01-09 ENCOUNTER — Ambulatory Visit: Payer: Medicare Other | Admitting: Physical Therapy

## 2023-01-09 ENCOUNTER — Other Ambulatory Visit: Payer: Self-pay

## 2023-01-09 ENCOUNTER — Telehealth: Payer: Self-pay | Admitting: *Deleted

## 2023-01-09 ENCOUNTER — Emergency Department (HOSPITAL_COMMUNITY)
Admission: EM | Admit: 2023-01-09 | Discharge: 2023-01-09 | Disposition: A | Discharge status: Home / Self Care | Payer: Medicare Other | Attending: Emergency Medicine | Admitting: Emergency Medicine

## 2023-01-09 ENCOUNTER — Encounter (HOSPITAL_COMMUNITY): Payer: Self-pay

## 2023-01-09 ENCOUNTER — Emergency Department (HOSPITAL_COMMUNITY): Payer: Medicare Other

## 2023-01-09 ENCOUNTER — Telehealth: Payer: Self-pay | Admitting: Physical Therapy

## 2023-01-09 VITALS — BP 157/92 | HR 91

## 2023-01-09 DIAGNOSIS — Z7901 Long term (current) use of anticoagulants: Secondary | ICD-10-CM | POA: Diagnosis not present

## 2023-01-09 DIAGNOSIS — R42 Dizziness and giddiness: Secondary | ICD-10-CM | POA: Diagnosis not present

## 2023-01-09 DIAGNOSIS — R29818 Other symptoms and signs involving the nervous system: Secondary | ICD-10-CM | POA: Diagnosis not present

## 2023-01-09 DIAGNOSIS — Z8673 Personal history of transient ischemic attack (TIA), and cerebral infarction without residual deficits: Secondary | ICD-10-CM | POA: Insufficient documentation

## 2023-01-09 DIAGNOSIS — I639 Cerebral infarction, unspecified: Secondary | ICD-10-CM

## 2023-01-09 DIAGNOSIS — E876 Hypokalemia: Secondary | ICD-10-CM | POA: Diagnosis not present

## 2023-01-09 DIAGNOSIS — I6782 Cerebral ischemia: Secondary | ICD-10-CM | POA: Diagnosis not present

## 2023-01-09 DIAGNOSIS — R000 Tachycardia, unspecified: Secondary | ICD-10-CM | POA: Insufficient documentation

## 2023-01-09 DIAGNOSIS — G238 Other specified degenerative diseases of basal ganglia: Secondary | ICD-10-CM | POA: Diagnosis not present

## 2023-01-09 LAB — CBC
HCT: 39.7 % (ref 39.0–52.0)
Hemoglobin: 13.1 g/dL (ref 13.0–17.0)
MCH: 32.6 pg (ref 26.0–34.0)
MCHC: 33 g/dL (ref 30.0–36.0)
MCV: 98.8 fL (ref 80.0–100.0)
Platelets: 149 10*3/uL — ABNORMAL LOW (ref 150–400)
RBC: 4.02 MIL/uL — ABNORMAL LOW (ref 4.22–5.81)
RDW: 13.2 % (ref 11.5–15.5)
WBC: 6.7 10*3/uL (ref 4.0–10.5)
nRBC: 0 % (ref 0.0–0.2)

## 2023-01-09 LAB — BASIC METABOLIC PANEL
Anion gap: 13 (ref 5–15)
BUN: 16 mg/dL (ref 8–23)
CO2: 28 mmol/L (ref 22–32)
Calcium: 9.2 mg/dL (ref 8.9–10.3)
Chloride: 97 mmol/L — ABNORMAL LOW (ref 98–111)
Creatinine, Ser: 0.95 mg/dL (ref 0.61–1.24)
GFR, Estimated: >60 mL/min (ref >60)
Glucose, Bld: 110 mg/dL — ABNORMAL HIGH (ref 70–99)
Potassium: 3.2 mmol/L — ABNORMAL LOW (ref 3.5–5.1)
Sodium: 138 mmol/L (ref 135–145)

## 2023-01-09 LAB — HEPATIC FUNCTION PANEL
ALT: 15 U/L (ref 0–44)
AST: 26 U/L (ref 15–41)
Albumin: 4.3 g/dL (ref 3.5–5.0)
Alkaline Phosphatase: 70 U/L (ref 38–126)
Bilirubin, Direct: 0.1 mg/dL (ref 0.0–0.2)
Indirect Bilirubin: 0.9 mg/dL (ref 0.3–0.9)
Total Bilirubin: 1 mg/dL (ref 0.3–1.2)
Total Protein: 7.5 g/dL (ref 6.5–8.1)

## 2023-01-09 MED ORDER — IOHEXOL 350 MG/ML SOLN
75.0000 mL | Freq: Once | INTRAVENOUS | Status: AC | PRN
  Administered 2023-01-09: 75 mL via INTRAVENOUS

## 2023-01-09 MED ORDER — POTASSIUM CHLORIDE CRYS ER 20 MEQ PO TBCR
40.0000 meq | EXTENDED_RELEASE_TABLET | Freq: Once | ORAL | Status: AC
  Administered 2023-01-09: 40 meq via ORAL
  Filled 2023-01-09: qty 2

## 2023-01-09 NOTE — Telephone Encounter (Signed)
Physical therapist came over to office to say patient is there for his initial evaluation. MRI resulted yesterday. Dr Lucia Gaskins recommends ER based on results showing stroke.

## 2023-01-09 NOTE — ED Provider Notes (Signed)
[place] EMERGENCY DEPARTMENT AT [facility] Provider Note   CSN: [id number] Arrival date & time: 01/09/23  1031     History  Chief Complaint  Patient presents with   Dizziness    [name] [name] is a 88 y.o. male.  Patient complains of dizziness for 1-1/2 years.  Patient reports that 2 years ago he had a brainstem CVA.  Patient reports 2 months ago he began feeling dizzy.  Patient reports he has had multiple episodes of dizziness on and off.  Patient reports 3 weeks ago he began having more frequent episodes.  Patient reports that he has had 3 episodes over the last week the worst episode being yesterday at 11 AM.  Patient reports he is not as dizzy today.  Patient states that yesterday he was having to hold onto the walls in order to be able to walk.  Patient reports he has been seeing Dr. Daisy Blossom who is a neurologist.  Patient has seen Dr. Jodean Lima and is going to physical therapy for Epley maneuvers.  The history is provided by the patient. No language interpreter was used.  Dizziness      Home Medications Prior to Admission medications   Medication Sig Start Date End Date Taking? Authorizing Provider  acetaminophen (TYLENOL) 325 MG tablet Take 650 mg by mouth once as needed for moderate pain or headache.    [provider]  amoxicillin (AMOXIL) 500 MG tablet Take 4 tablets (2,000 mg total) by mouth as directed. 1 hour prior to dental work including cleanings 11/25/22   Janetta Hora, PA-C  apixaban (ELIQUIS) 5 MG TABS tablet Take 1 tablet (5 mg total) by mouth 2 (two) times daily. Take evening dose starting 07/09/20 07/09/20   Baglia, Corrina, PA-C  butalbital-acetaminophen-caffeine (FIORICET, ESGIC) 50-325-40 MG per tablet Take 1 tablet by mouth 3 (three) times daily as needed for migraine.     [provider]  cetirizine (ZYRTEC) 10 MG tablet Take 1 tablet (10 mg total) by mouth daily. 10/29/21   Joaquim Nam, MD  fluticasone (FLONASE) 50  MCG/ACT nasal spray Place 1 spray into both nostrils daily. Patient taking differently: Place 1 spray into both nostrils daily as needed for allergies. 09/11/18   Joaquim Nam, MD  hydrochlorothiazide (HYDRODIURIL) 25 MG tablet Take 25 mg by mouth daily. 07/01/09   [provider]  latanoprost (XALATAN) 0.005 % ophthalmic solution Place 1 drop into both eyes at bedtime. 07/01/20   [provider]  melatonin 3 MG TABS tablet Take 3 mg by mouth at bedtime as needed (Sleep).    [provider]  Multiple Vitamins-Minerals (MULTIVITAMIN WITH MINERALS) tablet Take 1 tablet by mouth daily.    [provider]  Polyethyl Glycol-Propyl Glycol (SYSTANE ULTRA OP) Place 1-2 drops into both eyes once as needed (dry eyes.).    [provider]  potassium chloride (KLOR-CON M) 10 MEQ tablet Take 10 mEq by mouth daily.    [provider]  pravastatin (PRAVACHOL) 80 MG tablet Take 1 tablet (80 mg total) by mouth every evening. 01/01/21   Duke Salvia, MD      Allergies    Ace inhibitors, Zocor [simvastatin], Nsaids, Prednisone, Requip [ropinirole], Cephalexin, Chocolate, Doxycycline, Gabapentin, Hycodan [hydrocodone bit-homatrop mbr], and Oxycodone    Review of Systems   Review of Systems  Neurological:  Positive for dizziness.  All other systems reviewed and are negative.   Physical Exam Updated Vital Signs BP (!) 161/93  Pulse 82   Temp 98.1 F (36.7 C) (Oral)   Resp 17   Ht 5\' 10"  (1.778 m)   Wt 81.6 kg   SpO2 100%   BMI 25.83 kg/m  Physical Exam Vitals and nursing note reviewed.  Constitutional:      Appearance: He is well-developed.  HENT:     Head: Normocephalic.     Right Ear: Tympanic membrane normal.     Left Ear: Tympanic membrane normal.     Nose: Nose normal.     Mouth/Throat:     Mouth: Mucous membranes are moist.  Cardiovascular:     Rate and Rhythm: Regular rhythm. Tachycardia present.  Pulmonary:     Effort: Pulmonary  effort is normal.  Abdominal:     General: Abdomen is flat. There is no distension.  Musculoskeletal:        General: Normal range of motion.     Cervical back: Normal range of motion.  Skin:    General: Skin is warm.  Neurological:     General: No focal deficit present.     Mental Status: He is alert and oriented to person, place, and time.  Psychiatric:        Mood and Affect: Mood normal.     ED Results / Procedures / Treatments   Labs (all labs ordered are listed, but only abnormal results are displayed) Labs Reviewed  BASIC METABOLIC PANEL - Abnormal; Notable for the following components:      Result Value   Potassium 3.2 (*)    Chloride 97 (*)    Glucose, Bld 110 (*)    All other components within normal limits  CBC - Abnormal; Notable for the following components:   RBC 4.02 (*)    Platelets 149 (*)    All other components within normal limits  HEPATIC FUNCTION PANEL    EKG EKG Interpretation Date/Time:  Monday January 09 2023 11:54:43 EDT Ventricular Rate:  81 PR Interval:    QRS Duration:  185 QT Interval:  454 QTC Calculation: 528 R Axis:   51  Text Interpretation: Atrial fibrillation IVCD, consider atypical LBBB Confirmed by Vanetta Mulders [phone]) on 01/09/2023 12:08:18 PM  Radiology No results found.  Procedures Procedures    Medications Ordered in ED Medications  iohexol (OMNIPAQUE) 350 MG/ML injection 75 mL (75 mLs Intravenous Contrast Given 01/09/23 1423)    ED Course/ Medical Decision Making/ A&P Clinical Course as of 01/09/23 1552  Mon Jan 09, 2023  1546 Previous brainstem stroke 2.5 years ago. Has been dizzy since, worse in the past 2 weeks. Neurology has gotten MRI, showed acute stroke. Seen by ENT. - CTA head and neck [KH]    Clinical Course User Index [KH] Claretha Cooper, DO                                 Medical Decision Making Patient complains of dizziness.  Patient reports dizziness has been ongoing for the past 3  months.  Patient reports 3 weeks ago he started having more frequent episodes.  Patient reports yesterday he had a episode of dizziness where he had to hold onto the walls in order to stand up.  Patient is currently being followed by Dr. Daisy Blossom for neurology and Dr. Jodean Lima ENT for his dizziness  Amount and/or Complexity of Data Reviewed External Data Reviewed: notes.    Details: Neurology notes reviewed.  Patient had an appointment today  with physical therapy.  He had an MRI that was ordered on 912 which showed an acute CVA.  This film was not read until today.  Patient's physical therapist spoke to the neurologist who advised patient to come to the emergency department Labs: ordered. Decision-making details documented in ED Course.    Details: Potassium is 3.2. Radiology: ordered. Discussion of management or test interpretation with external provider(s): I spoke to Dr. Amada Jupiter who is our neurologist on-call he advised obtaining CTA of patient's head and neck.  He advised Pt is on eliquis.  Patient may not need further therapy but may benefit from inpatient evaluation and physical therapy  Risk Prescription drug management. Risk Details: His care turned over to Dr. Valora Corporal pending CT angio head and neck.           Final Clinical Impression(s) / ED Diagnoses Final diagnoses:  Cerebrovascular accident (CVA), unspecified mechanism [facility])    Rx / DC Orders ED Discharge Orders     None         Osie Cheeks 01/09/23 1553    Vanetta Mulders, MD 01/12/23 1232

## 2023-01-09 NOTE — Telephone Encounter (Signed)
I called [facility] ER and was unable to reach triage. I called again (patient already arrived) and was transferred to Dr Darlyn Chamber PA Maralyn Sago (?). I LVM for her and left our office number asking for call back. Wanted to make sure they saw his new MRI results are showing concern for acute/subacute infarcts.

## 2023-01-09 NOTE — Therapy (Signed)
[facility] [address] [place], [place], [address] Phone: [phone]   Fax:  [phone]  Patient Details  Name: LINUX EICHER MRN: [id number] Date of Birth: [birth year] Referring Provider:  Newman Pies, MD  Encounter Date: 01/09/2023  Patient arrives to PT and reports online baseline dizziness and episodes of vertigo sitting still and without head turns. Patient arrives to session with imaging changes showing possible acute ischemic changes. Therapist followed up with Dr. Lucia Gaskins next door who advised patient go to ED. Patient's son was able to take him. Provided note for patient listing symptoms and new imaging report. Dr. Trevor Mace office to call ahead as well. Session arrive no charge.   Vitals:   01/09/23 0941  BP: (!) 157/92  Pulse: 91    Carmelia Bake, PT, DPT 01/09/2023, 10:18 AM  [facility] [address] [place], [place], [address] Phone: [phone]   Fax:  [phone]

## 2023-01-09 NOTE — Telephone Encounter (Signed)
Opened in error

## 2023-01-09 NOTE — ED Provider Notes (Signed)
  Physical Exam  BP (!) 155/96   Pulse 81   Temp 98.1 F (36.7 C) (Oral)   Resp 16   Ht 5\' 10"  (1.778 m)   Wt 81.6 kg   SpO2 98%   BMI 25.83 kg/m   Physical Exam Vitals and nursing note reviewed.  Constitutional:      General: He is not in acute distress.    Appearance: Normal appearance.  Pulmonary:     Effort: Pulmonary effort is normal. No respiratory distress.  Skin:    General: Skin is warm and dry.  Neurological:     Mental Status: He is alert and oriented to person, place, and time.     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 Has been dizzy since, worse in the past 2 weeks. Neurology has gotten MRI, showed acute stroke. Seen by ENT. - CTA head and neck [KH]    Clinical Course User Index [KH] Claretha Cooper, DO   Medical Decision Making Amount and/or Complexity of Data Reviewed Labs: ordered. Radiology: ordered.  Risk Prescription drug management.   At the time my assumption of care, patient is afebrile, hemodynamically stable, in no acute distress.  I reviewed his results.  CBC is without leukocytosis or anemia.  CMP with mild hypokalemia, otherwise normal electrolytes without AKI or anion gap.  CTA of the head and neck demonstrates no acute intracranial process.  No LVO or hemodynamically significant stenosis in the neck.  Potassium repleted.  Results discussed with patient.  He does report that he would prefer discharge over admission to go home to his wife.  The prior team discussed with neurology.  As patient is on both anticoagulation and antiplatelet, they did not recommend any additional intervention unless large vessel obstruction was demonstrated which was not present on CT.  Given this, do feel that discharge is appropriate.  Recommend close outpatient follow-up with neurology.  Patient reports understanding and agreement.  Discharged that further  acute event under my care in the emergency department.       Claretha Cooper, DO 01/09/23 2004    Derwood Kaplan, MD 01/10/23 2125

## 2023-01-09 NOTE — ED Triage Notes (Signed)
Pt came in via POV d/t feeling dizzy for a few months & his therapist encouraged him to come into ED for evaluation. A/Ox4, does have Hx of stroke BP is 177/110 while in triage. Denies any pain.

## 2023-01-09 NOTE — Discharge Instructions (Addendum)
You were seen today for worsening dizziness.  Your CT did not show any occlusion of the blood vessels in your head or neck.  Please continue to take your blood thinner and aspirin.  Please make an appointment to follow-up with your neurologist as soon as you can.  -Restricted diffusion within the right parietal periventricular region may represent acute ischemia. Additional foci in the left frontal region may represent additional subacute ischemic changes. -Moderate chronic small vessel ischemic disease.

## 2023-01-09 NOTE — Progress Notes (Signed)
MRi read yesterday per chart. Today he is symptomatic we were informed of results and symptoms today and we advised emergency room will follow up bethany culbertson nurse tried to call triage but no answer.

## 2023-01-09 NOTE — Telephone Encounter (Signed)
I've asked jillian or angel to call and schedule second opinion with dr Pearlean Brownie for his strokes since he is on anticoag and antiplatelet and has had more acute strokes, was sent to hospital today for further eval of LVo and since that was negative he was sent home. I don;t think Dr. Pearlean Brownie will mind I will cc him here thank you

## 2023-01-10 ENCOUNTER — Other Ambulatory Visit: Payer: Self-pay | Admitting: Physician Assistant

## 2023-01-10 ENCOUNTER — Other Ambulatory Visit: Payer: Self-pay

## 2023-01-10 DIAGNOSIS — I6522 Occlusion and stenosis of left carotid artery: Secondary | ICD-10-CM

## 2023-01-10 DIAGNOSIS — Z952 Presence of prosthetic heart valve: Secondary | ICD-10-CM

## 2023-01-10 NOTE — Telephone Encounter (Signed)
Noted thanks , fyi Dr Lucia Gaskins

## 2023-01-10 NOTE — Telephone Encounter (Signed)
Pt scheduled to see Dr. Pearlean Brownie for 01/12/23 at 3:30pm

## 2023-01-12 ENCOUNTER — Ambulatory Visit (INDEPENDENT_AMBULATORY_CARE_PROVIDER_SITE_OTHER): Payer: Medicare Other | Admitting: Neurology

## 2023-01-12 ENCOUNTER — Encounter: Payer: Self-pay | Admitting: Neurology

## 2023-01-12 VITALS — BP 156/86 | HR 103 | Ht 70.0 in | Wt 177.8 lb

## 2023-01-12 DIAGNOSIS — R42 Dizziness and giddiness: Secondary | ICD-10-CM

## 2023-01-12 DIAGNOSIS — M791 Myalgia, unspecified site: Secondary | ICD-10-CM | POA: Diagnosis not present

## 2023-01-12 DIAGNOSIS — I2585 Chronic coronary microvascular dysfunction: Secondary | ICD-10-CM

## 2023-01-12 DIAGNOSIS — M79651 Pain in right thigh: Secondary | ICD-10-CM | POA: Diagnosis not present

## 2023-01-12 DIAGNOSIS — R2681 Unsteadiness on feet: Secondary | ICD-10-CM

## 2023-01-12 DIAGNOSIS — I6381 Other cerebral infarction due to occlusion or stenosis of small artery: Secondary | ICD-10-CM | POA: Diagnosis not present

## 2023-01-12 DIAGNOSIS — T466X5A Adverse effect of antihyperlipidemic and antiarteriosclerotic drugs, initial encounter: Secondary | ICD-10-CM

## 2023-01-12 NOTE — Patient Instructions (Signed)
I had a long d/w patient and his son about his dizziness and gait imbalance which is likely due to his recent lacunar strokes, risk for recurrent stroke/TIAs, personally independently reviewed imaging studies and stroke evaluation results and answered questions.Continue Eliquis (apixaban) daily  for secondary stroke prevention given history of longstanding atrial fibrillation but I do not feel addition of aspirin is indicated due to increased bleeding risk without clearly documented benefit and maintain strict control of hypertension with blood pressure goal below 130/90, diabetes with hemoglobin A1c goal below 6.5% and lipids with LDL cholesterol goal below 70 mg/dL. I also advised the patient to eat a healthy diet with plenty of whole grains, cereals, fruits and vegetables, exercise regularly and maintain ideal body weight .I recommend he continue his schedule outpatient physical therapy to improve gait and balance.  Check lipid profile and hemoglobin A1c today.  I advised him to get up slowly and avoid sudden and quick movements.  I recommend he start taking co-Q10 200 mg daily for his right thigh pain which is likely statin myalgia.  He may also consider possible participation in the [place] atrial fibrillation stroke trial if interested and will be given information to review and decide.  Followup in the future with Dr Lucia Gaskins and no scheduled follow-up appointment with me is necessary.  Stroke Prevention Some medical conditions and behaviors can lead to a higher chance of having a stroke. You can help prevent a stroke by eating healthy, exercising, not smoking, and managing any medical conditions you have. Stroke is a leading cause of functional impairment. Primary prevention is particularly important because a majority of strokes are first-time events. Stroke changes the lives of not only those who experience a stroke but also their family and other caregivers. How can this condition affect me? A stroke  is a medical emergency and should be treated right away. A stroke can lead to brain damage and can sometimes be life-threatening. If a person gets medical treatment right away, there is a better chance of surviving and recovering from a stroke. What can increase my risk? The following medical conditions may increase your risk of a stroke: Cardiovascular disease. High blood pressure (hypertension). Diabetes. High cholesterol. Sickle cell disease. Blood clotting disorders (hypercoagulable state). Obesity. Sleep disorders (obstructive sleep apnea). Other risk factors include: Being older than age 70. Having a history of blood clots, stroke, or mini-stroke (transient ischemic attack, TIA). Genetic factors, such as race, ethnicity, or a family history of stroke. Smoking cigarettes or using other tobacco products. Taking birth control pills, especially if you also use tobacco. Heavy use of alcohol or drugs, especially cocaine and methamphetamine. Physical inactivity. What actions can I take to prevent this? Manage your health conditions High cholesterol levels. Eating a healthy diet is important for preventing high cholesterol. If cholesterol cannot be managed through diet alone, you may need to take medicines. Take any prescribed medicines to control your cholesterol as told by your health care provider. Hypertension. To reduce your risk of stroke, try to keep your blood pressure below 130/80. Eating a healthy diet and exercising regularly are important for controlling blood pressure. If these steps are not enough to manage your blood pressure, you may need to take medicines. Take any prescribed medicines to control hypertension as told by your health care provider. Ask your health care provider if you should monitor your blood pressure at home. Have your blood pressure checked every year, even if your blood pressure is normal. Blood pressure increases with  age and some medical  conditions. Diabetes. Eating a healthy diet and exercising regularly are important parts of managing your blood sugar (glucose). If your blood sugar cannot be managed through diet and exercise, you may need to take medicines. Take any prescribed medicines to control your diabetes as told by your health care provider. Get evaluated for obstructive sleep apnea. Talk to your health care provider about getting a sleep evaluation if you snore a lot or have excessive sleepiness. Make sure that any other medical conditions you have, such as atrial fibrillation or atherosclerosis, are managed. Nutrition Follow instructions from your health care provider about what to eat or drink to help manage your health condition. These instructions may include: Reducing your daily calorie intake. Limiting how much salt (sodium) you use to 1,500 milligrams (mg) each day. Using only healthy fats for cooking, such as olive oil, canola oil, or sunflower oil. Eating healthy foods. You can do this by: Choosing foods that are high in fiber, such as whole grains, and fresh fruits and vegetables. Eating at least 5 servings of fruits and vegetables a day. Try to fill one-half of your plate with fruits and vegetables at each meal. Choosing lean protein foods, such as lean cuts of meat, poultry without skin, fish, tofu, beans, and nuts. Eating low-fat dairy products. Avoiding foods that are high in sodium. This can help lower blood pressure. Avoiding foods that have saturated fat, trans fat, and cholesterol. This can help prevent high cholesterol. Avoiding processed and prepared foods. Counting your daily carbohydrate intake.  Lifestyle If you drink alcohol: Limit how much you have to: 0-1 drink a day for women who are not pregnant. 0-2 drinks a day for men. Know how much alcohol is in your drink. In the [place] one drink equals one 12 oz bottle of beer ( ), one 5 oz glass of wine ( ), or one 1 oz glass of hard  liquor (44mL). Do not use any products that contain nicotine or tobacco. These products include cigarettes, chewing tobacco, and vaping devices, such as e-cigarettes. If you need help quitting, ask your health care provider. Avoid secondhand smoke. Do not use drugs. Activity  Try to stay at a healthy weight. Get at least 30 minutes of exercise on most days, such as: Fast walking. Biking. Swimming. Medicines Take over-the-counter and prescription medicines only as told by your health care provider. Aspirin or blood thinners (antiplatelets or anticoagulants) may be recommended to reduce your risk of forming blood clots that can lead to stroke. Avoid taking birth control pills. Talk to your health care provider about the risks of taking birth control pills if: You are over 52 years old. You smoke. You get very bad headaches. You have had a blood clot. Where to find more information American Stroke Association: www.strokeassociation.org Get help right away if: You or a loved one has any symptoms of a stroke. "BE FAST" is an easy way to remember the main warning signs of a stroke: B - Balance. Signs are dizziness, sudden trouble walking, or loss of balance. E - Eyes. Signs are trouble seeing or a sudden change in vision. F - Face. Signs are sudden weakness or numbness of the face, or the face or eyelid drooping on one side. A - Arms. Signs are weakness or numbness in an arm. This happens suddenly and usually on one side of the body. S - Speech. Signs are sudden trouble speaking, slurred speech, or trouble understanding what people say. T - Time. Time  to call emergency services. Write down what time symptoms started. You or a loved one has other signs of a stroke, such as: A sudden, severe headache with no known cause. Nausea or vomiting. Seizure. These symptoms may represent a serious problem that is an emergency. Do not wait to see if the symptoms will go away. Get medical help right  away. Call your local emergency services (911 in the [place] Do not drive yourself to the hospital. Summary You can help to prevent a stroke by eating healthy, exercising, not smoking, limiting alcohol intake, and managing any medical conditions you may have. Do not use any products that contain nicotine or tobacco. These include cigarettes, chewing tobacco, and vaping devices, such as e-cigarettes. If you need help quitting, ask your health care provider. Remember "BE FAST" for warning signs of a stroke. Get help right away if you or a loved one has any of these signs. This information is not intended to replace advice given to you by your health care provider. Make sure you discuss any questions you have with your health care provider. Document Revised: 03/14/2022 Document Reviewed: 03/14/2022 Elsevier Patient Education  2024 [employer].

## 2023-01-12 NOTE — Progress Notes (Signed)
[facility] [address] [place]. [place] [address] [phone]       OFFICE CONSULT NOTE  [name] [name] Date of Birth:  04/15/1935 Medical Record Number:  [id number]   Referring MD:  [name] [name] [name]  Reason for Referral: Stroke second opinion  HPI: [name] [name] is a 88 year old pleasant Caucasian male seen today for initial office consultation visit for second opinion for stroke.  He is accompanied by his son.  History is obtained from them and review of electronic medical records.  I personally reviewed pertinent available imaging films in PACS. Patient states he has been having dizziness off and on for the last few months has gotten worse.  He states that this is a feeling of imbalance he leans to either side.  He has to catch himself.  He had 1 fall 2 months ago but fortunately did not hurt himself.  He has prior history of positional vertigo but he is not vertiginous during most of these episodes.  He was seen previously in our office in 2021 for sudden onset monocular superior left eye visual field loss on 03/10/2020 in the setting of left retinal branch artery occlusion due to left ICA unstable plaque without any high-grade stenosis.  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 Lucia [name] however follow-up repeat MRI which showed no further enhancement in worrisome findings suggesting it was likely a subacute lacunar periventricular infarct.  Patient has had gait and balance difficulties since then.  When he walks quickly tends to be off balance and leaning on either side.  He does have significant vascular risk factors of hypertension, hyperlipidemia, coronary artery disease and A-fib and has been on anticoagulation for 20+ years and of late has been on Eliquis.  He is tolerating Eliquis well without bruising or bleeding.  He does get prior history of brainstem infarct as  well as right occipital infarct and March 20 23.  He denies any significant memory difficulties, headaches, focal extremity weakness or numbness.  On inquiry he admits to right thigh pain and discomfort he has particularly at night.  He has trouble lifting his right thigh.  He has been on statins for 20+ years.  He has not been on any co-Q10.  Review of his recent imaging studies showed CT angiogram of the brain on 01/09/2023 which showed no significant extracranial or intracranial large vessel stenosis or occlusion.  MRI scan of the brain personally reviewed on 01/05/2023 shows curvilinear enhancement in the right parietal periventricular region likely if subacute lacunar infarct.  Previous MRI on 07/19/2021 had shown right occipital periventricular well.  ROS:   14 system review of systems is positive for dizziness, imbalance, vertigo, thigh pain and leg weakness and all other systems negative  PMH:  Past Medical History:  Diagnosis Date   Arthritis    Atrial fibrillation, permanent (HCC)    Bladder neoplasm    Chronic cough    NO CARDIAC OR PULMONARY RELATED   Coronary artery disease CARDIOLOGIST-  [name] [name]   a. s/p CABG 2004, b. LHC with patent grafts 07/2005, ejection fraction 50%;  c. 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 Amiodarone Rx   S/P CABG x 5 2004   S/P TAVR (transcatheter aortic valve replacement) 11/15/2022   s/p TAVR with a 29mm Edwards S3UR via the TF approach by [name]. Excell [name] & [name] [name].   Severe aortic stenosis     Social History:  Social History   Socioeconomic History   Marital status: Married    Spouse name: [name] [name]    Number of children: 3   Years of education: Not on file   Highest education level: Not on file  Occupational History   Occupation: Retired  Tobacco Use   Smoking status: Former    Current packs/day: 0.00    Average packs/day: 1 pack/day for 25.0 years (25.0 ttl pk-yrs)    Types: Cigarettes    Start date: 04/26/1955    Quit date: 04/25/1980    Years since quitting: 42.7   Smokeless tobacco: Never  Vaping Use   Vaping status: Never Used  Substance and Sexual Activity   Alcohol use: No    Alcohol/week: 0.0 standard drinks of alcohol   Drug use: No   Sexual activity: Not on file  Other Topics Concern   Not on file  Social History Narrative   [employer] '56-'58, overseas to [place]   Some care through [place]   No service disability   Social Determinants of Health   Financial Resource Strain: Low Risk  (10/22/2021)   Overall Financial Resource Strain (CARDIA)    Difficulty of Paying Living Expenses: Not hard at all  Food Insecurity: No Food Insecurity (11/18/2022)   Hunger Vital Sign    Worried About Running Out of Food in the Last Year: Never true    Ran Out of Food in the Last Year: Never true  Transportation Needs: No Transportation Needs (11/18/2022)   PRAPARE - Administrator, Civil Service (Medical): No    Lack of Transportation (Non-Medical): No  Physical Activity: Inactive (10/22/2021)   Exercise Vital Sign    Days of Exercise per Week: 0 days    Minutes of Exercise per Session: 0 min  Stress: No Stress Concern Present (10/22/2021)   [employer] of Occupational Health - Occupational Stress Questionnaire    Feeling of Stress : Not at all  Social Connections: Unknown (09/06/2021)   Received from [facility]   Social Network    Social Network: Not on file  Intimate Partner Violence: Unknown (07/30/2021)   Received from [facility]   HITS    Physically Hurt: Not on file    Insult or Talk Down To: Not on file    Threaten Physical  Harm: Not on file    Scream or Curse: Not on file    Medications:   Current Outpatient Medications on File Prior to Visit  Medication Sig Dispense Refill   acetaminophen (TYLENOL) 325 MG tablet Take 650 mg by mouth once as needed for moderate pain or headache.     amoxicillin (AMOXIL) 500 MG tablet Take 4 tablets (2,000 mg total) by mouth as directed. 1 hour prior to dental work including cleanings 12 tablet 12   apixaban (ELIQUIS) 5 MG TABS tablet Take 1 tablet (5 mg total) by mouth 2 (two) times daily. Take evening dose starting 07/09/20 180 tablet 3   butalbital-acetaminophen-caffeine (FIORICET, ESGIC) 50-325-40 MG per tablet Take 1 tablet by mouth 3 (  three) times daily as needed for migraine.      cetirizine (ZYRTEC) 10 MG tablet Take 1 tablet (10 mg total) by mouth daily.     fluticasone (FLONASE) 50 MCG/ACT nasal spray Place 1 spray into both nostrils daily. (Patient taking differently: Place 1 spray into both nostrils daily as needed for allergies.) 16 g 1   hydrochlorothiazide (HYDRODIURIL) 25 MG tablet Take 25 mg by mouth daily.     latanoprost (XALATAN) 0.005 % ophthalmic solution Place 1 drop into both eyes at bedtime.     melatonin 3 MG TABS tablet Take 3 mg by mouth at bedtime as needed (Sleep).     Multiple Vitamins-Minerals (MULTIVITAMIN WITH MINERALS) tablet Take 1 tablet by mouth daily.     Polyethyl Glycol-Propyl Glycol (SYSTANE ULTRA OP) Place 1-2 drops into both eyes once as needed (dry eyes.).     potassium chloride (KLOR-CON M) 10 MEQ tablet Take 10 mEq by mouth daily.     pravastatin (PRAVACHOL) 80 MG tablet Take 1 tablet (80 mg total) by mouth every evening. 90 tablet 0   No current facility-administered medications on file prior to visit.    Allergies:   Allergies  Allergen Reactions   Ace Inhibitors Other (See Comments)    COUGH   Zocor [Simvastatin] Other (See Comments)    MYALGIA   Nsaids Other (See Comments)    Would avoid given anticoagulation    Prednisone Other (See Comments)    Prednisone caused pt to have elevated BP.   Requip [Ropinirole]     didn't sleep well and had trouble with abnormal dreams   Cephalexin Rash   Chocolate Other (See Comments)    Headaches    Doxycycline Other (See Comments)    Nausea and abd pain   Gabapentin Other (See Comments)    Dizziness   Hycodan [Hydrocodone Bit-Homatrop Mbr] Nausea And Vomiting   Oxycodone Nausea Only    Pt states that he cannot take prescription pain medication    Physical Exam General: Frail elderly pleasant Caucasian male, seated, in no evident distress Head: head normocephalic and atraumatic.   Neck: supple with no carotid or supraclavicular bruits Cardiovascular: regular rate and rhythm, no murmurs Musculoskeletal: no deformity Skin:  no rash/petichiae Vascular:  Normal pulses all extremities  Neurologic Exam Mental Status: Awake and fully alert. Oriented to place and time. Recent and remote memory intact. Attention span, concentration and fund of knowledge appropriate. Mood and affect appropriate.  Cranial Nerves: Fundoscopic exam reveals sharp disc margins. Pupils equal, briskly reactive to light. Extraocular movements full without nystagmus. Visual fields full to confrontation. Hearing intact. Facial sensation intact. Face, tongue, palate moves normally and symmetrically.  Motor: Normal bulk and tone. Normal strength in all tested extremity muscles except mild weakness of right hip flexors and ankle dorsiflexors.. Sensory.: intact to touch , pinprick , position and vibratory sensation.  Coordination: Rapid alternating movements normal in all extremities. Finger-to-nose and heel-to-shin performed accurately bilaterally. Gait and Station: Arises from chair without difficulty. Stance is normal. Gait demonstrates mild imbalance with slight dragging of the right leg.  Unsteady while standing on right leg unsupported.  Unable to do tandem walking. Reflexes: 1+ and  symmetric. Toes downgoing.   NIHSS  1 Modified Rankin  2   ASSESSMENT: 88 year old Caucasian male with subacute dizziness and gait imbalance likely due to combination of bilateral lacunar strokes from small vessel disease.  Vascular risk factors of chronic atrial fibrillation, hypertension, hyperlipidemia, coronary artery disease, aortic valve disease  and cerebrovascular disease     PLAN:I had a long d/w patient and his son about his dizziness and gait imbalance which is likely due to his recent lacunar strokes, risk for recurrent stroke/TIAs, personally independently reviewed imaging studies and stroke evaluation results and answered questions.Continue Eliquis (apixaban) daily  for secondary stroke prevention given history of longstanding atrial fibrillation but I do not feel addition of aspirin is indicated due to increased bleeding risk without clearly documented benefit and maintain strict control of hypertension with blood pressure goal below 130/90, diabetes with hemoglobin A1c goal below 6.5% and lipids with LDL cholesterol goal below 70 mg/dL. I also advised the patient to eat a healthy diet with plenty of whole grains, cereals, fruits and vegetables, exercise regularly and maintain ideal body weight .I recommend he continue his schedule outpatient physical therapy to improve gait and balance.  Check lipid profile and hemoglobin A1c today.  I advised him to get up slowly and avoid sudden and quick movements.  I recommend he start taking co-Q10 200 mg daily for his right thigh pain which is likely statin myalgia.  He may also consider possible participation in the [place] atrial fibrillation stroke trial ( eliquis versus Milvexian- factor 11 inhibitor ) if interested and will be given information to review and decide.  Followup in the future with [name] [name] [name] and no scheduled follow-up appointment with me is necessary.  Greater than 50% time during this 45-minute consultation visit was spent on  counseling and coordination of care discussion about his lacunar strokes and dizziness and imbalance and atrial fibrillation and answering questions.  Delia Heady, MD Note: This document was prepared with digital dictation and possible smart phrase technology. Any transcriptional errors that result from this process are unintentional.

## 2023-01-13 LAB — LIPID PANEL
Chol/HDL Ratio: 2.5 ratio (ref 0.0–5.0)
Cholesterol, Total: 171 mg/dL (ref 100–199)
HDL: 68 mg/dL
LDL Chol Calc (NIH): 89 mg/dL (ref 0–99)
Triglycerides: 73 mg/dL (ref 0–149)
VLDL Cholesterol Cal: 14 mg/dL (ref 5–40)

## 2023-01-13 LAB — HEMOGLOBIN A1C
Est. average glucose Bld gHb Est-mCnc: 117 mg/dL
Hgb A1c MFr Bld: 5.7 % — ABNORMAL HIGH (ref 4.8–5.6)

## 2023-01-17 ENCOUNTER — Telehealth: Payer: Self-pay

## 2023-01-17 DIAGNOSIS — R42 Dizziness and giddiness: Secondary | ICD-10-CM

## 2023-01-17 DIAGNOSIS — I6381 Other cerebral infarction due to occlusion or stenosis of small artery: Secondary | ICD-10-CM

## 2023-01-17 NOTE — Telephone Encounter (Signed)
Hey Dr Pearlean Brownie,  You saw this patient on 9/19 at the request of Dr Lucia Gaskins for a stroke hospital follow up. He called in today advising that he is needing a new referral for PT at neuro rehab. He was originally scheduled for a vestibular eval before being sent to the ED and rehab is now requiring a new referral.  Is this something you could place for the patient?

## 2023-01-17 NOTE — Telephone Encounter (Signed)
Patient called in - he was at the Neuro Rehab next door when he started exhibiting stroke like symptoms. He was sent to the ED 9/16 for work up. He was discharged and followed up with Dr Pearlean Brownie for an appointment 9/19. Dr Pearlean Brownie advised to f/u with Dr Lucia Gaskins for further appointments.  He is calling in today as he is needing a new referral to the Neuro Rehab, they will not accept his old referral since he was seen in the hospital. He states he is having constant dizziness and was originally at neuro rehab for a vestibular evaluation. Since he had the stroke, they are requiring a new referral.  He is asking if we are able to place this referral to PT for him, and he is also wanting to schedule his next appointment with  Dr Lucia Gaskins.  Please advise.

## 2023-01-17 NOTE — Telephone Encounter (Signed)
Per Dr Lucia Gaskins will place order order for PT  today and wants to see pt back in 6 months  Percell Belt is aware

## 2023-01-18 ENCOUNTER — Telehealth: Payer: Self-pay

## 2023-01-18 ENCOUNTER — Encounter: Payer: Self-pay | Admitting: Family Medicine

## 2023-01-18 NOTE — Telephone Encounter (Signed)
Pt called c/o an achy R thigh that causes him trouble sleeping.  Reviewed pt's chart, returned call for clarification, two identifiers used. Instructed pt to reach out to PCP to r/o other causes since he has no other symptoms and could be a multitude of other issues. If vascular in nature, instructed him to have PCP send a referral. Confirmed understanding.

## 2023-01-18 NOTE — Telephone Encounter (Signed)
[facility] reached out to [place] requesting PT order be sent over, thank you.

## 2023-01-18 NOTE — Telephone Encounter (Signed)
Signed, thank you

## 2023-01-19 ENCOUNTER — Encounter: Payer: Self-pay | Admitting: Family Medicine

## 2023-01-20 ENCOUNTER — Other Ambulatory Visit: Payer: Self-pay | Admitting: Neurology

## 2023-01-20 MED ORDER — EZETIMIBE 10 MG PO TABS: 10.0000 mg | ORAL_TABLET | Freq: Every day | ORAL | 3 refills | Status: DC

## 2023-01-20 NOTE — Progress Notes (Signed)
Kindly inform the patient that screening test for diabetes is satisfactory. Cholesterol profile is not satisfactory with bad cholesterol being slightly high.  I recommend he add Zetia 10 mg daily to his r pravastatin and I have sent a prescription to his listed pharmacy

## 2023-01-23 ENCOUNTER — Encounter: Payer: Self-pay | Admitting: Family Medicine

## 2023-01-23 ENCOUNTER — Ambulatory Visit (INDEPENDENT_AMBULATORY_CARE_PROVIDER_SITE_OTHER): Payer: Medicare Other | Admitting: Family Medicine

## 2023-01-23 VITALS — BP 142/78 | HR 88 | Temp 97.7°F | Ht 70.0 in | Wt 175.0 lb

## 2023-01-23 DIAGNOSIS — Z23 Encounter for immunization: Secondary | ICD-10-CM | POA: Diagnosis not present

## 2023-01-23 DIAGNOSIS — M79606 Pain in leg, unspecified: Secondary | ICD-10-CM

## 2023-01-23 DIAGNOSIS — Z8673 Personal history of transient ischemic attack (TIA), and cerebral infarction without residual deficits: Secondary | ICD-10-CM | POA: Insufficient documentation

## 2023-01-23 DIAGNOSIS — R2689 Other abnormalities of gait and mobility: Secondary | ICD-10-CM | POA: Diagnosis not present

## 2023-01-23 MED ORDER — FLUTICASONE PROPIONATE 50 MCG/ACT NA SUSP: 1.0000 | Freq: Every day | NASAL | Status: DC | PRN

## 2023-01-23 NOTE — Assessment & Plan Note (Signed)
W/o focal weakness on gross testing but this could be related to CVA, d/w pt.  With neuro rehab pending for balance changes.

## 2023-01-23 NOTE — Telephone Encounter (Signed)
Called patient and LVM to give [place] a call back or check his MyChart.

## 2023-01-23 NOTE — Progress Notes (Signed)
R quad/thigh pain at night.  Going on for weeks.  Sleep disrupted.  Not yet on zetia.  Still on pravastatin, longstanding.  Tylenol didn't help.  No pain during the day.  Heating pad at night helps.  No hamstring pain.  Pain is limited to the thigh, doesn't radiate past the knee.    He has a sensation of leg weakness with walking.  Using a walker in the meantime but his walker is shorter than needed and he needs a walker that is the appropriate height.  D/w pt.  Will ask staff for DME order help.    His wife has ongoing medical concerns.  He is caring for her in the meantime.  He has lost weight in the midst of that.    H/o CVA with neuro rehab pending for balance changes.  He has had mult episodes of horizontal vertigo over the last few weeks.  No vertigo now.  D/w pt about zetia start and rationale for use.    Prev MRI with IMPRESSION:    MRI brain (with and without) demonstrating: -Restricted diffusion within the right parietal periventricular region may represent acute ischemia. Additional foci in the left frontal region may represent additional subacute ischemic changes. -Moderate chronic small vessel ischemic disease.   Flu shot today.    Meds, vitals, and allergies reviewed.   ROS: Per HPI unless specifically indicated in ROS section   Nad Ncat Neck supple no LA IRR Ctab Abd soft, not ttp Normal R quad and hip testing, not ttp now.  R quad and hamstring not ttp S/S grossly wnl BLE and able to bear weight.  No tremor.  36 minutes were devoted to patient care in this encounter (this includes time spent reviewing the patient's file/history, interviewing and examining the patient, counseling/reviewing plan with patient).

## 2023-01-23 NOTE — Assessment & Plan Note (Signed)
Could be from relative quad strain after CVA, using his walker that is shorter than needed, helping lift his wife.   Would avoid muscle relaxers for now.   I would try using bengay or similar at night.  Refer for home health PT.  Note sent to staff about DME order for walker.

## 2023-01-23 NOTE — Assessment & Plan Note (Signed)
Continue eliquis, Reasonable to try zetia. Continue pravastatin.  With neuro rehab pending for balance changes.

## 2023-01-23 NOTE — Telephone Encounter (Signed)
-----   Message from Delia Heady sent at 01/20/2023  4:59 PM EDT ----- Joneen Roach inform the patient that screening test for diabetes is satisfactory. Cholesterol profile is not satisfactory with bad cholesterol being slightly high.  I recommend he add Zetia 10 mg daily to his r pravastatin and I have sent a prescription to his listed pharmacy

## 2023-01-23 NOTE — Patient Instructions (Addendum)
I would try using bengay or similar at night.  You should get a call about home health PT.  Reasonable to try zetia.  Take care.  Glad to see you.

## 2023-01-24 ENCOUNTER — Ambulatory Visit: Payer: Medicare Other | Admitting: Family Medicine

## 2023-01-24 ENCOUNTER — Telehealth: Payer: Self-pay | Admitting: Family Medicine

## 2023-01-24 NOTE — Telephone Encounter (Signed)
Patient called in and wanted to speak with someone in regards to his referral. He stated that he has a referral for [facility] and has an appointment with outpatient tomorrow. He stated that his insurance will not cover both and wanted to discuss this. He can be reached at [phone]. Thank you!

## 2023-01-24 NOTE — Telephone Encounter (Signed)
Called patient he would like to stay on [facility] orders only. I advised patient that he just needs to call and let them know he wants to do home health.

## 2023-01-24 NOTE — Telephone Encounter (Signed)
Patient contacted the office regarding this request, asked if someone could call him by the end of the day? Advised that original message was sent and clinical staff may not have had a chance to see message yet.

## 2023-01-26 DIAGNOSIS — G629 Polyneuropathy, unspecified: Secondary | ICD-10-CM | POA: Diagnosis not present

## 2023-01-26 DIAGNOSIS — I472 Ventricular tachycardia, unspecified: Secondary | ICD-10-CM | POA: Diagnosis not present

## 2023-01-26 DIAGNOSIS — M1990 Unspecified osteoarthritis, unspecified site: Secondary | ICD-10-CM | POA: Diagnosis not present

## 2023-01-26 DIAGNOSIS — I2510 Atherosclerotic heart disease of native coronary artery without angina pectoris: Secondary | ICD-10-CM | POA: Diagnosis not present

## 2023-01-26 DIAGNOSIS — Z7982 Long term (current) use of aspirin: Secondary | ICD-10-CM | POA: Diagnosis not present

## 2023-01-26 DIAGNOSIS — Z951 Presence of aortocoronary bypass graft: Secondary | ICD-10-CM | POA: Diagnosis not present

## 2023-01-26 DIAGNOSIS — I4819 Other persistent atrial fibrillation: Secondary | ICD-10-CM | POA: Diagnosis not present

## 2023-01-26 DIAGNOSIS — Z7901 Long term (current) use of anticoagulants: Secondary | ICD-10-CM | POA: Diagnosis not present

## 2023-01-26 DIAGNOSIS — Z952 Presence of prosthetic heart valve: Secondary | ICD-10-CM | POA: Diagnosis not present

## 2023-01-26 DIAGNOSIS — Z79899 Other long term (current) drug therapy: Secondary | ICD-10-CM | POA: Diagnosis not present

## 2023-01-26 DIAGNOSIS — H04123 Dry eye syndrome of bilateral lacrimal glands: Secondary | ICD-10-CM | POA: Diagnosis not present

## 2023-01-26 DIAGNOSIS — I2585 Chronic coronary microvascular dysfunction: Secondary | ICD-10-CM | POA: Diagnosis not present

## 2023-01-26 DIAGNOSIS — I712 Thoracic aortic aneurysm, without rupture, unspecified: Secondary | ICD-10-CM | POA: Diagnosis not present

## 2023-01-26 DIAGNOSIS — I4510 Unspecified right bundle-branch block: Secondary | ICD-10-CM | POA: Diagnosis not present

## 2023-01-26 DIAGNOSIS — K219 Gastro-esophageal reflux disease without esophagitis: Secondary | ICD-10-CM | POA: Diagnosis not present

## 2023-01-26 DIAGNOSIS — R531 Weakness: Secondary | ICD-10-CM | POA: Diagnosis not present

## 2023-01-26 DIAGNOSIS — Z9181 History of falling: Secondary | ICD-10-CM | POA: Diagnosis not present

## 2023-01-26 DIAGNOSIS — I69398 Other sequelae of cerebral infarction: Secondary | ICD-10-CM | POA: Diagnosis not present

## 2023-01-26 DIAGNOSIS — E785 Hyperlipidemia, unspecified: Secondary | ICD-10-CM | POA: Diagnosis not present

## 2023-01-26 DIAGNOSIS — M791 Myalgia, unspecified site: Secondary | ICD-10-CM | POA: Diagnosis not present

## 2023-01-26 DIAGNOSIS — Z87891 Personal history of nicotine dependence: Secondary | ICD-10-CM | POA: Diagnosis not present

## 2023-01-26 DIAGNOSIS — M79651 Pain in right thigh: Secondary | ICD-10-CM | POA: Diagnosis not present

## 2023-01-26 DIAGNOSIS — R262 Difficulty in walking, not elsewhere classified: Secondary | ICD-10-CM | POA: Diagnosis not present

## 2023-01-26 DIAGNOSIS — H5462 Unqualified visual loss, left eye, normal vision right eye: Secondary | ICD-10-CM | POA: Diagnosis not present

## 2023-01-26 DIAGNOSIS — Z8551 Personal history of malignant neoplasm of bladder: Secondary | ICD-10-CM | POA: Diagnosis not present

## 2023-01-30 ENCOUNTER — Encounter: Payer: Self-pay | Admitting: Family Medicine

## 2023-01-30 NOTE — Addendum Note (Signed)
Addended by: Wendie Simmer B on: 01/30/2023 12:47 PM   Modules accepted: Orders

## 2023-02-01 ENCOUNTER — Encounter: Payer: Self-pay | Admitting: Family Medicine

## 2023-02-01 DIAGNOSIS — R262 Difficulty in walking, not elsewhere classified: Secondary | ICD-10-CM | POA: Diagnosis not present

## 2023-02-01 DIAGNOSIS — I4819 Other persistent atrial fibrillation: Secondary | ICD-10-CM | POA: Diagnosis not present

## 2023-02-01 DIAGNOSIS — I2585 Chronic coronary microvascular dysfunction: Secondary | ICD-10-CM | POA: Diagnosis not present

## 2023-02-01 DIAGNOSIS — R531 Weakness: Secondary | ICD-10-CM | POA: Diagnosis not present

## 2023-02-01 DIAGNOSIS — I2510 Atherosclerotic heart disease of native coronary artery without angina pectoris: Secondary | ICD-10-CM | POA: Diagnosis not present

## 2023-02-01 DIAGNOSIS — I69398 Other sequelae of cerebral infarction: Secondary | ICD-10-CM | POA: Diagnosis not present

## 2023-02-01 NOTE — Telephone Encounter (Signed)
error 

## 2023-02-05 ENCOUNTER — Other Ambulatory Visit: Payer: Self-pay | Admitting: Family Medicine

## 2023-02-05 MED ORDER — MECLIZINE HCL 12.5 MG PO TABS: 12.5000 mg | ORAL_TABLET | Freq: Two times a day (BID) | ORAL | 1 refills | Status: DC | PRN

## 2023-02-06 ENCOUNTER — Encounter: Payer: Self-pay | Admitting: Internal Medicine

## 2023-02-06 DIAGNOSIS — R531 Weakness: Secondary | ICD-10-CM | POA: Diagnosis not present

## 2023-02-06 DIAGNOSIS — R262 Difficulty in walking, not elsewhere classified: Secondary | ICD-10-CM | POA: Diagnosis not present

## 2023-02-06 DIAGNOSIS — I2510 Atherosclerotic heart disease of native coronary artery without angina pectoris: Secondary | ICD-10-CM | POA: Diagnosis not present

## 2023-02-06 DIAGNOSIS — I69398 Other sequelae of cerebral infarction: Secondary | ICD-10-CM | POA: Diagnosis not present

## 2023-02-06 DIAGNOSIS — I4819 Other persistent atrial fibrillation: Secondary | ICD-10-CM | POA: Diagnosis not present

## 2023-02-06 DIAGNOSIS — I2585 Chronic coronary microvascular dysfunction: Secondary | ICD-10-CM | POA: Diagnosis not present

## 2023-02-08 ENCOUNTER — Encounter: Payer: Self-pay | Admitting: Family Medicine

## 2023-02-08 ENCOUNTER — Other Ambulatory Visit: Payer: Self-pay | Admitting: Family Medicine

## 2023-02-08 DIAGNOSIS — M79606 Pain in leg, unspecified: Secondary | ICD-10-CM

## 2023-02-09 DIAGNOSIS — I2585 Chronic coronary microvascular dysfunction: Secondary | ICD-10-CM | POA: Diagnosis not present

## 2023-02-09 DIAGNOSIS — R262 Difficulty in walking, not elsewhere classified: Secondary | ICD-10-CM | POA: Diagnosis not present

## 2023-02-09 DIAGNOSIS — I69398 Other sequelae of cerebral infarction: Secondary | ICD-10-CM | POA: Diagnosis not present

## 2023-02-09 DIAGNOSIS — I4819 Other persistent atrial fibrillation: Secondary | ICD-10-CM | POA: Diagnosis not present

## 2023-02-09 DIAGNOSIS — I2510 Atherosclerotic heart disease of native coronary artery without angina pectoris: Secondary | ICD-10-CM | POA: Diagnosis not present

## 2023-02-09 DIAGNOSIS — R531 Weakness: Secondary | ICD-10-CM | POA: Diagnosis not present

## 2023-02-11 ENCOUNTER — Encounter: Payer: Self-pay | Admitting: Family Medicine

## 2023-02-13 ENCOUNTER — Telehealth: Payer: Self-pay | Admitting: Neurology

## 2023-02-13 ENCOUNTER — Ambulatory Visit (INDEPENDENT_AMBULATORY_CARE_PROVIDER_SITE_OTHER): Payer: Medicare Other

## 2023-02-13 VITALS — Ht 70.0 in | Wt 175.0 lb

## 2023-02-13 DIAGNOSIS — Z0000 Encounter for general adult medical examination without abnormal findings: Secondary | ICD-10-CM

## 2023-02-13 NOTE — Progress Notes (Signed)
Subjective:   [name] [name] is a 88 y.o. male who presents for Medicare Annual/Subsequent preventive examination.  Visit Complete: Virtual I connected with  [name] [name] on 02/13/23 by a audio enabled telemedicine application and verified that I am speaking with the correct person using two identifiers.  Patient Location: Home  Provider Location: Home Office  I discussed the limitations of evaluation and management by telemedicine. The patient expressed understanding and agreed to proceed.  Vital Signs: Because this visit was a virtual/telehealth visit, some criteria may be missing or patient reported. Any vitals not documented were not able to be obtained and vitals that have been documented are patient reported.  Patient Medicare AWV questionnaire was completed by the patient on (not done); I have confirmed that all information answered by patient is correct and no changes since this date. Cardiac Risk Factors include: advanced age (>85men, >48 women);dyslipidemia;male gender;hypertension;sedentary lifestyle    Objective:    Today's Vitals   02/13/23 1542  Weight: 175 lb (79.4 kg)  Height: 5\' 10"  (1.778 m)   Body mass index is 25.11 kg/m.     02/13/2023    3:53 PM 01/09/2023   10:54 AM 01/09/2023    9:38 AM 11/15/2022    7:26 AM 10/14/2022    8:57 AM 10/22/2021   10:47 AM 08/30/2021    8:46 PM  Advanced Directives  Does Patient Have a Medical Advance Directive? Yes  Yes Yes Yes Yes Yes  Type of Advance Directive Living will;Healthcare Power of [employer] Power of [place];Living will Healthcare Power of [place];Living will Healthcare Power of [place];Living will Healthcare Power of [place];Living will Healthcare Power of [place];Living will Living will  Does patient want to make changes to medical advance directive?     No - Patient declined  No - Patient declined  Copy of Healthcare Power of Attorney in Chart? No - copy requested No - copy requested,  Physician notified  No - copy requested No - copy requested No - copy requested     Current Medications (verified) Outpatient Encounter Medications as of 02/13/2023  Medication Sig   amoxicillin (AMOXIL) 500 MG tablet Take 4 tablets (2,000 mg total) by mouth as directed. 1 hour prior to dental work including cleanings   acetaminophen (TYLENOL) 325 MG tablet Take 650 mg by mouth once as needed for moderate pain or headache.   apixaban (ELIQUIS) 5 MG TABS tablet Take 1 tablet (5 mg total) by mouth 2 (two) times daily. Take evening dose starting 07/09/20   butalbital-acetaminophen-caffeine (FIORICET, ESGIC) 50-325-40 MG per tablet Take 1 tablet by mouth 3 (three) times daily as needed for migraine.    cetirizine (ZYRTEC) 10 MG tablet Take 1 tablet (10 mg total) by mouth daily.   ezetimibe (ZETIA) 10 MG tablet Take 1 tablet (10 mg total) by mouth daily. (Patient not taking: Reported on 01/23/2023)   fluticasone (FLONASE) 50 MCG/ACT nasal spray Place 1 spray into both nostrils daily as needed for allergies or rhinitis.   hydrochlorothiazide (HYDRODIURIL) 25 MG tablet Take 25 mg by mouth daily.   latanoprost (XALATAN) 0.005 % ophthalmic solution Place 1 drop into both eyes at bedtime.   meclizine (ANTIVERT) 12.5 MG tablet Take 1 tablet (12.5 mg total) by mouth 2 (two) times daily as needed for dizziness (Sedation caution).   melatonin 3 MG TABS tablet Take 3 mg by mouth at bedtime as needed (Sleep).   Multiple Vitamins-Minerals (MULTIVITAMIN WITH MINERALS) tablet Take 1 tablet by mouth daily.  Polyethyl Glycol-Propyl Glycol (SYSTANE ULTRA OP) Place 1-2 drops into both eyes once as needed (dry eyes.).   potassium chloride (KLOR-CON M) 10 MEQ tablet Take 10 mEq by mouth daily.   pravastatin (PRAVACHOL) 80 MG tablet Take 1 tablet (80 mg total) by mouth every evening.   No facility-administered encounter medications on file as of 02/13/2023.    Allergies (verified) Ace inhibitors, Zocor [simvastatin],  Nsaids, Prednisone, Requip [ropinirole], Cephalexin, Chocolate, Doxycycline, Gabapentin, Hycodan [hydrocodone bit-homatrop mbr], and Oxycodone   History: Past Medical History:  Diagnosis Date   Arthritis    Atrial fibrillation, permanent (HCC)    Bladder neoplasm    Chronic cough    NO CARDIAC OR PULMONARY RELATED   Coronary artery disease CARDIOLOGIST-  DR KLEIN/ [facility]   a. s/p CABG 2004, b. LHC with patent grafts 07/2005, ejection fraction 50%;  c. 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 Excell Seltzer & Leafy Ro.   Severe aortic stenosis    Past Surgical History:  Procedure Laterality Date   CARDIAC CATHETERIZATION  07-26-2005  DR GAMBLE   PRESERVED LVF/  EF 50%/ PATENT GRAFTS   CARDIAC CATHETERIZATION  03-04-2003  DR GAMBLE   SEVERE 3 VESSEL DISEASE   CARDIAC CATHETERIZATION  1991  DR [facility]   PAF/ FALSE POSITIVE STRESS TEST   CAROTID ENDARTERECTOMY Left 2022   CATARACT EXTRACTION W/ INTRAOCULAR LENS IMPLANT Right    CORONARY ARTERY BYPASS GRAFT  03-08-2003  DR ZOXWRUEA   LIMA TO LAD/ SVG TO OM2/  SVG TO DIAGONAL / SVG TO PDA & PLA   CYSTOSCOPY WITH BIOPSY N/A 12/25/2012   Procedure: CYSTOSCOPY WITH BLADDER BIOPSY;  Surgeon: Antony Haste, MD;  Location: [facility];  Service: Urology;  Laterality: N/A;   ELECTROPHYSIOLOGY STUDY  07-27-2005  DR Lewayne Bunting   MAPPING --   RESULT NONINDUCIBLE VT OR SVT/  DX RIGHT VENTRICULAR OUTFLOW TRACT VT AND RULES OUT  MORE MALIGNANT CAUSES OF VT   ENDARTERECTOMY Left 07/08/2020   Procedure: LEFT CAROTID ENDARTERECTOMY;  Surgeon: Nada Libman, MD;  Location: [facility] OR;  Service: Vascular;  Laterality: Left;   INGUINAL HERNIA REPAIR Bilateral 1954  &  1990   INTRAOPERATIVE TRANSTHORACIC ECHOCARDIOGRAM N/A 11/15/2022   Procedure: INTRAOPERATIVE TRANSTHORACIC ECHOCARDIOGRAM;  Surgeon: Tonny Bollman, MD;  Location: [facility] INVASIVE CV LAB;  Service: Open Heart Surgery;  Laterality: N/A;   KNEE ARTHROSCOPY Right 1994   LEFT HEART CATH AND CORS/GRAFTS ANGIOGRAPHY N/A 10/14/2022   Procedure: LEFT HEART CATH AND CORS/GRAFTS ANGIOGRAPHY;  Surgeon: Tonny Bollman, MD;  Location: [facility] INVASIVE CV LAB;  Service: Cardiovascular;  Laterality: N/A;   LUMBAR DISC SURGERY  1999   L4 -- L5   MOHS SURGERY     by Dr. Jeanella Craze 2019   REMOVAL VOCAL CORD POLYPS  1978   TONSILLECTOMY  AS CHILD   TRANSCATHETER AORTIC VALVE REPLACEMENT, TRANSFEMORAL N/A 11/15/2022   Procedure:  Transcatheter Aortic Valve Replacement, Transfemoral;  Surgeon: Tonny Bollman, MD;  Location: [facility] INVASIVE CV LAB;  Service: Open Heart Surgery;  Laterality: N/A;   TRANSTHORACIC ECHOCARDIOGRAM  08/08/2011   MILD LVH/  EF 55-60%/  GRADE I DIASTOLIC DYSFUNCTION/ MILD AV STENOSIS   Family History  Problem Relation Age of Onset   Cancer Mother    Stroke Neg Hx    Social History   Socioeconomic History   Marital status: Married    Spouse name: [name]   Number of children: 3   Years of education: Not on file   Highest education level: Not on file  Occupational History   Occupation: Retired  Tobacco Use   Smoking status: Former    Current packs/day: 0.00    Average packs/day: 1 pack/day for 25.0 years (25.0 ttl pk-yrs)    Types: Cigarettes    Start date: 04/26/1955    Quit date: 04/25/1980    Years since quitting: 42.8   Smokeless tobacco: Never  Vaping Use   Vaping status: Never Used  Substance and Sexual Activity   Alcohol use: No    Alcohol/week: 0.0  standard drinks of alcohol   Drug use: No   Sexual activity: Not on file  Other Topics Concern   Not on file  Social History Narrative   [employer]-'58, overseas to [place]   Some care through [place]   No service disability   Social Determinants of Health   Financial Resource Strain: Low Risk  (02/13/2023)   Overall Financial Resource Strain (CARDIA)    Difficulty of Paying Living Expenses: Not hard at all  Food Insecurity: No Food Insecurity (02/13/2023)   Hunger Vital Sign    Worried About Running Out of Food in the Last Year: Never true    Ran Out of Food in the Last Year: Never true  Transportation Needs: No Transportation Needs (02/13/2023)   PRAPARE - Administrator, Civil Service (Medical): No    Lack of Transportation (Non-Medical): No  Physical Activity: Insufficiently Active (02/13/2023)   Exercise Vital Sign    Days of Exercise per Week: 2 days    Minutes of Exercise per Session: 40 min  Stress: No Stress Concern Present (02/13/2023)   [employer] of Occupational Health - Occupational Stress Questionnaire    Feeling of Stress : Not at all  Social Connections: Moderately Integrated (02/13/2023)   Social Connection and Isolation Panel [NHANES]    Frequency of Communication with Friends and Family: Three times a week    Frequency of Social Gatherings with Friends and Family: Once a week    Attends Religious Services: 1 to 4 times per year    Active Member of [employer] or Organizations: No    Attends Engineer, structural: Never    Marital Status: Married    Tobacco Counseling Counseling given: Not Answered   Clinical Intake:  Pre-visit preparation completed: No  Pain : No/denies pain     BMI - recorded: 25.11 Nutritional Status: BMI 25 -29 Overweight Nutritional Risks: None Diabetes: No  How often do you need to have someone help you when you read instructions, pamphlets, or other written materials from your doctor or pharmacy?: 1 -  Never  Interpreter Needed?: No  Comments: lives with wife Information entered by :: B.Kadar Chance,LPN   Activities of Daily Living    02/13/2023    3:54 PM 11/15/2022    7:33 AM  In your present state of health, do you have any difficulty  performing the following activities:  Hearing? 0 1  Vision? 0 0  Difficulty concentrating or making decisions? 1 0  Walking or climbing stairs? 1 1  Dressing or bathing? 0 0  Doing errands, shopping? 1   Preparing Food and eating ? N   Using the Toilet? N   In the past six months, have you accidently leaked urine? N   Do you have problems with loss of bowel control? N   Managing your Medications? N   Managing your Finances? N   Housekeeping or managing your Housekeeping? N     Patient Care Team: Joaquim Nam, MD as PCP - General (Family Medicine) Duke Salvia, MD as PCP - Cardiology (Cardiology) Kathyrn Sheriff, [facility] (Inactive) as Pharmacist (Pharmacist)  Indicate any recent Medical Services you may have received from other than [facility] providers in the past year (date may be approximate).     Assessment:   This is a routine wellness examination for Theodora.  Hearing/Vision screen Hearing Screening - Comments:: Pt says he has hearing aids and can hear well Vision Screening - Comments:: Pt says he sees well w/readers Dr Dione Booze   Goals Addressed             This Visit's Progress    Patient Stated   On track    08/22/2019, I will maintain and continue medications as prescribed.        Depression Screen    02/13/2023    3:51 PM 01/23/2023   12:42 PM 10/07/2022   10:41 AM 10/22/2021   10:48 AM 10/19/2020   10:38 AM 03/17/2020   10:46 AM 08/22/2019   10:35 AM  PHQ 2/9 Scores  PHQ - 2 Score 0 0 0 0 0 0 0  PHQ- 9 Score  0 0  0  0    Fall Risk    02/13/2023    3:46 PM 01/23/2023   12:42 PM 10/07/2022   10:41 AM 10/22/2021   10:48 AM 10/19/2020   10:38 AM  Fall Risk   Falls in the past year? 1 0 1 0 0  Number falls in  past yr: 0 0 0 0 0  Injury with Fall? 1 0 0 0 0  Comment fell due to stroke      Risk for fall due to : No Fall Risks No Fall Risks No Fall Risks Medication side effect Medication side effect  Follow up Education provided;Falls prevention discussed Falls evaluation completed Falls evaluation completed Falls evaluation completed;Education provided;Falls prevention discussed Falls evaluation completed;Falls prevention discussed    MEDICARE RISK AT HOME: Medicare Risk at Home Any stairs in or around the home?: Yes If so, are there any without handrails?: Yes Home free of loose throw rugs in walkways, pet beds, electrical cords, etc?: Yes Adequate lighting in your home to reduce risk of falls?: Yes Life alert?: No Use of a cane, walker or w/c?: Yes (walker outside:has ramp) Grab bars in the bathroom?: No Shower chair or bench in shower?: Yes Elevated toilet seat or a handicapped toilet?: Yes  TIMED UP AND GO:  Was the test performed?  No    Cognitive Function:    10/19/2020   10:44 AM 08/22/2019   10:37 AM  MMSE - Mini Mental State Exam  Orientation to time 5 5  Orientation to Place 5 5  Registration 3 3  Attention/ Calculation 5 5  Recall 3 3  Language- repeat 1 1  02/13/2023    3:59 PM 10/22/2021   10:50 AM  6CIT Screen  What Year? 0 points 0 points  What month? 0 points 0 points  What time? 0 points 0 points  Count back from 20 0 points 0 points  Months in reverse 0 points 0 points  Repeat phrase 0 points 0 points  Total Score 0 points 0 points    Immunizations Immunization History  Administered Date(s) Administered   Fluad Quad(high Dose 65+) 12/28/2018, 02/17/2020   Fluad Trivalent(High Dose 65+) 01/23/2023   Influenza Split 03/15/2011, 01/19/2012   Influenza Whole 03/11/2002, 02/24/2009, 01/14/2010   Influenza, High Dose Seasonal PF 12/25/2015, 02/19/2017, 01/28/2021   Influenza,inj,Quad PF,6+ Mos 01/16/2013, 01/22/2014, 12/25/2014, 01/19/2016,  01/23/2017, 01/04/2018   Influenza-Unspecified 03/11/2002, 02/19/2003, 01/24/2004, 02/23/2005, 01/23/2006, 04/03/2007, 02/21/2008, 01/23/2009, 02/23/2009, 12/24/2009, 02/12/2020   PFIZER(Purple Top)SARS-COV-2 Vaccination 06/28/2019, 07/19/2019, 09/18/2019, 03/24/2020   Pneumococcal Conjugate-13 11/27/2013, 12/25/2014   Pneumococcal Polysaccharide-23 04/17/2002   Pneumococcal-Unspecified 03/11/2002   Td 12/07/2001, 04/26/2005, 05/11/2005   Td,absorbed, Preservative Free, Adult Use, Lf Unspecified 10/25/2021   Tdap 12/08/2011, 10/25/2021   Tetanus 11/24/2011   Zoster Recombinant(Shingrix) 08/20/2020, 01/01/2021   Zoster, Live 05/17/2007    TDAP status: Up to date  Flu Vaccine status: Up to date  Pneumococcal vaccine status: Up to date  Covid-19 vaccine status: Completed vaccines  Qualifies for Shingles Vaccine? Yes   Zostavax completed Yes   Shingrix Completed?: Yes  Screening Tests Health Maintenance  Topic Date Due   Colonoscopy  05/30/2023   COVID-19 Vaccine (5 - 2023-24 season) 02/08/2028 (Originally 12/25/2022)   Medicare Annual Wellness (AWV)  02/13/2024   DTaP/Tdap/Td (8 - Td or Tdap) 10/26/2031   Pneumonia Vaccine 6+ Years old  Completed   INFLUENZA VACCINE  Completed   Zoster Vaccines- Shingrix  Completed   HPV VACCINES  Aged Out    Health Maintenance  Health Maintenance Due  Topic Date Due   Colonoscopy  05/30/2023   Colonoscopy: No longer required  Lung Cancer Screening: (Low Dose CT Chest recommended if Age 41-80 years, 20 pack-year currently smoking OR have quit w/in 15years.) does not qualify.   Lung Cancer Screening Referral: no  Additional Screening:  Hepatitis C Screening: does not qualify; Completed no  Vision Screening: Recommended annual ophthalmology exams for early detection of glaucoma and other disorders of the eye. Is the patient up to date with their annual eye exam?  Yes  Who is the provider or what is the name of the office in which  the patient attends annual eye exams? Dr Dione Booze If pt is not established with a provider, would they like to be referred to a provider to establish care? No .   Dental Screening: Recommended annual dental exams for proper oral hygiene  Diabetic Foot Exam:   Community Resource Referral / Chronic Care Management: CRR required this visit?  No   CCM required this visit?  No    Plan:     I have personally reviewed and noted the following in the patient's chart:   Medical and social history Use of alcohol, tobacco or illicit drugs  Current medications and supplements including opioid prescriptions. Patient is not currently taking opioid prescriptions. Functional ability and status Nutritional status Physical activity Advanced directives List of other physicians Hospitalizations, surgeries, and ER visits in previous 12 months Vitals Screenings to include cognitive, depression, and falls Referrals and appointments  In addition, I have reviewed and discussed with patient certain preventive protocols, quality metrics, and best practice recommendations.  A written personalized care plan for preventive services as well as general preventive health recommendations were provided to patient.     Sue Lush, LPN   [id number]   After Visit Summary: (MyChart) Due to this being a telephonic visit, the after visit summary with patients personalized plan was offered to patient via MyChart   Nurse Notes: The patient states doing well and has no concerns or questions at this time.

## 2023-02-13 NOTE — Telephone Encounter (Signed)
The best thing to do is vestibular therapy and wait and see me after he has completed vestibular therapy.

## 2023-02-13 NOTE — Patient Instructions (Signed)
Mr. Raudabaugh , Thank you for taking time to come for your Medicare Wellness Visit. I appreciate your ongoing commitment to your health goals. Please review the following plan we discussed and let me know if I can assist you in the future.   Referrals/Orders/Follow-Ups/Clinician Recommendations: none  This is a list of the screening recommended for you and due dates:  Health Maintenance  Topic Date Due   Colon Cancer Screening  05/30/2023   COVID-19 Vaccine (5 - 2023-24 season) 02/08/2028*   Medicare Annual Wellness Visit  02/13/2024   DTaP/Tdap/Td vaccine (8 - Td or Tdap) 10/26/2031   Pneumonia Vaccine  Completed   Flu Shot  Completed   Zoster (Shingles) Vaccine  Completed   HPV Vaccine  Aged Out  *Topic was postponed. The date shown is not the original due date.    Advanced directives: (Copy Requested) Please bring a copy of your health care power of attorney and living will to the office to be added to your chart at your convenience.  Next Medicare Annual Wellness Visit scheduled for next year: Yes 02/14/24 @3 :40pm telephone

## 2023-02-13 NOTE — Telephone Encounter (Signed)
Pt states his dizziness has increased and he is asking if he can be seen earlier than Feb'25 by Dr Lucia Gaskins, pt was informed the request would be sent to RN 's for review, and that he would be called back by RN

## 2023-02-13 NOTE — Telephone Encounter (Signed)
I called pt.  He continues with constant dizziness since stroke (lightheadedness, dizziness, no vertigo) but he then stated that he has had 2-3 episodes of spinning (lasting 10-20 min until feels like back to normal).  Home Care/therapy have done maneuvers to say not vertigo.   Still doing therapy  (not vestibular).  Tried taking Coq10 and stopped after taking several times due to side effects.  Trying to get earlier appt.  I told will place on waitlist and ask if Dr. Lucia Gaskins had any other insights until seen.  He appreciated call back.

## 2023-02-14 ENCOUNTER — Other Ambulatory Visit: Payer: Self-pay

## 2023-02-14 ENCOUNTER — Ambulatory Visit (HOSPITAL_COMMUNITY)
Admission: RE | Admit: 2023-02-14 | Discharge: 2023-02-14 | Disposition: A | Discharge status: Home / Self Care | Payer: Medicare Other | Source: Ambulatory Visit | Attending: Surgery | Admitting: Surgery

## 2023-02-14 DIAGNOSIS — M25551 Pain in right hip: Secondary | ICD-10-CM | POA: Diagnosis not present

## 2023-02-14 NOTE — Telephone Encounter (Signed)
I called pt relayed that Dr. Lucia Gaskins does recommend the vestibular rehab for him to do.  [facility] out[facility] [address] he will call and see what they say and then we can make referral.  He appreicated call back.

## 2023-02-16 DIAGNOSIS — I4819 Other persistent atrial fibrillation: Secondary | ICD-10-CM | POA: Diagnosis not present

## 2023-02-16 DIAGNOSIS — I2585 Chronic coronary microvascular dysfunction: Secondary | ICD-10-CM | POA: Diagnosis not present

## 2023-02-16 DIAGNOSIS — I2510 Atherosclerotic heart disease of native coronary artery without angina pectoris: Secondary | ICD-10-CM | POA: Diagnosis not present

## 2023-02-16 DIAGNOSIS — R531 Weakness: Secondary | ICD-10-CM | POA: Diagnosis not present

## 2023-02-16 DIAGNOSIS — R262 Difficulty in walking, not elsewhere classified: Secondary | ICD-10-CM | POA: Diagnosis not present

## 2023-02-16 DIAGNOSIS — I69398 Other sequelae of cerebral infarction: Secondary | ICD-10-CM | POA: Diagnosis not present

## 2023-02-17 ENCOUNTER — Other Ambulatory Visit: Payer: Self-pay

## 2023-02-17 ENCOUNTER — Ambulatory Visit: Payer: Medicare Other | Attending: Neurology | Admitting: Physical Therapy

## 2023-02-17 DIAGNOSIS — R2681 Unsteadiness on feet: Secondary | ICD-10-CM | POA: Diagnosis not present

## 2023-02-17 DIAGNOSIS — I6381 Other cerebral infarction due to occlusion or stenosis of small artery: Secondary | ICD-10-CM | POA: Insufficient documentation

## 2023-02-17 DIAGNOSIS — R42 Dizziness and giddiness: Secondary | ICD-10-CM | POA: Insufficient documentation

## 2023-02-17 DIAGNOSIS — R2689 Other abnormalities of gait and mobility: Secondary | ICD-10-CM | POA: Diagnosis not present

## 2023-02-17 NOTE — Therapy (Unsigned)
OUTPATIENT PHYSICAL THERAPY VESTIBULAR EVALUATION     Patient Name: [name] [name] MRN: [id number] DOB:11/26/1934, 88 y.o., male Today's Date: 02/17/2023  END OF SESSION:  PT End of Session - 02/17/23 1453     Visit Number 1    Authorization Type Medicare/Medicaid    PT Start Time 1450    PT Stop Time 1530    PT Time Calculation (min) 40 min             Past Medical History:  Diagnosis Date   Arthritis    Atrial fibrillation, permanent (HCC)    Bladder neoplasm    Chronic cough    NO CARDIAC OR PULMONARY RELATED   Coronary artery disease CARDIOLOGIST-  DR KLEIN/ ALLRED   a. s/p CABG 2004, b. LHC with patent grafts 07/2005, ejection fraction 50%;  c. 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 Excell Seltzer & Leafy Ro.   Severe aortic stenosis    Past Surgical History:  Procedure Laterality Date   CARDIAC CATHETERIZATION  07-26-2005  DR GAMBLE   PRESERVED LVF/  EF 50%/ PATENT GRAFTS   CARDIAC CATHETERIZATION  03-04-2003  DR GAMBLE   SEVERE 3 VESSEL DISEASE   CARDIAC CATHETERIZATION  1991  DR [facility]   PAF/ FALSE POSITIVE STRESS TEST   CAROTID ENDARTERECTOMY Left 2022   CATARACT EXTRACTION W/ INTRAOCULAR LENS IMPLANT Right    CORONARY ARTERY BYPASS GRAFT  03-08-2003  DR [email or web address]   LIMA TO LAD/ SVG TO OM2/  SVG TO DIAGONAL / SVG TO PDA & PLA   CYSTOSCOPY WITH BIOPSY N/A 12/25/2012   Procedure: CYSTOSCOPY WITH BLADDER BIOPSY;  Surgeon: Antony Haste, MD;   Location: [facility];  Service: Urology;  Laterality: N/A;   ELECTROPHYSIOLOGY STUDY  07-27-2005  DR Lewayne Bunting   MAPPING --   RESULT NONINDUCIBLE VT OR SVT/  DX RIGHT VENTRICULAR OUTFLOW TRACT VT AND RULES OUT MORE MALIGNANT CAUSES OF VT   ENDARTERECTOMY Left 07/08/2020   Procedure: LEFT CAROTID ENDARTERECTOMY;  Surgeon: Nada Libman, MD;  Location: [facility] OR;  Service: Vascular;  Laterality: Left;   INGUINAL HERNIA REPAIR Bilateral 1954  &  1990   INTRAOPERATIVE TRANSTHORACIC ECHOCARDIOGRAM N/A 11/15/2022   Procedure: INTRAOPERATIVE TRANSTHORACIC ECHOCARDIOGRAM;  Surgeon: Tonny Bollman, MD;  Location: [facility] INVASIVE CV LAB;  Service: Open Heart Surgery;  Laterality: N/A;   KNEE ARTHROSCOPY Right 1994   LEFT HEART CATH AND CORS/GRAFTS ANGIOGRAPHY N/A 10/14/2022   Procedure: LEFT HEART CATH AND CORS/GRAFTS ANGIOGRAPHY;  Surgeon: Tonny Bollman, MD;  Location: [facility] INVASIVE CV LAB;  Service: Cardiovascular;  Laterality: N/A;   LUMBAR DISC SURGERY  1999   L4 -- L5   MOHS SURGERY     by Dr. Jeanella Craze 2019   REMOVAL VOCAL CORD POLYPS  1978   TONSILLECTOMY  AS CHILD   TRANSCATHETER AORTIC VALVE REPLACEMENT, TRANSFEMORAL N/A 11/15/2022   Procedure: Transcatheter Aortic Valve Replacement,  Transfemoral;  Surgeon: Tonny Bollman, MD;  Location: [facility] INVASIVE CV LAB;  Service: Open Heart Surgery;  Laterality: N/A;   TRANSTHORACIC ECHOCARDIOGRAM  08/08/2011   MILD LVH/  EF 55-60%/  GRADE I DIASTOLIC DYSFUNCTION/ MILD AV STENOSIS   Patient Active Problem List   Diagnosis Date Noted   History of CVA in adulthood 01/23/2023   Pain of lower extremity 01/23/2023   RBBB 11/15/2022   S/P TAVR (transcatheter aortic valve replacement) 11/15/2022   DNR (do not resuscitate) 08/08/2022   Permanent atrial fibrillation (HCC) 02/22/2022   Chronic pain of both knees 12/25/2021   Bilateral leg pain 09/28/2021   Bilateral pseudophakia 07/27/2020   Carpal tunnel syndrome 07/27/2020   Dermatochalasis  07/27/2020   Hematuria 07/27/2020   Insomnia 07/27/2020   Radiculopathy 07/27/2020   Stenosis of internal carotid artery with cerebral infarction, right (HCC) 07/08/2020   Imbalance 03/27/2020   Retinal artery occlusion 03/10/2020   Constipation 12/22/2019   Rhinitis sicca 05/22/2018   Sciatica 12/28/2017   Neuropathy 10/07/2017   Foot swelling 10/07/2017   CAD (coronary artery disease), autologous vein bypass graft 10/31/2016   Family history of colon cancer 10/31/2016   History of adenomatous polyp of colon 10/31/2016   Osteoarthritis 10/31/2016   Skin lesion 06/04/2015   Elevated TSH 12/26/2014   Severe aortic stenosis 05/16/2013   Bladder neoplasm 11/23/2012   BCC (basal cell carcinoma of skin) 08/29/2012   GERD (gastroesophageal reflux disease) 05/01/2012   Hyperlipidemia 03/14/2012   TMJ pain dysfunction syndrome 01/20/2012   Long term (current) use of anticoagulants 08/31/2011   RVOT ventricular tachycardia (HCC)    Cough 04/29/2009   HEARING LOSS, SUDDEN 12/03/2007   HTN (hypertension) 02/22/2007   S/P CABG x 5 2004    PCP: Joaquim Nam, MD REFERRING PROVIDER:  Anson Fret, MD   REFERRING DIAG:  63.81 (ICD-10-CM) - Lacunar infarct, acute (HCC)  R42 (ICD-10-CM) - Dizziness   THERAPY DIAG:  No diagnosis found.  ONSET DATE: May/June 2024  Rationale for Evaluation and Treatment: Rehabilitation  SUBJECTIVE:   SUBJECTIVE STATEMENT: Pt reports his wife had a fall and fractured her vertebrae and diagnosed with Parkinson's. Pt is the primary caregiver. States thinks were fine until May/June. Pt had brainstem stroke ~1.5 years ago. Has had on/off dizziness from that. Had an aortic valve in July and since then has had a decline in his function noting decrease in strength and balance. Pt reports more issues with his dizziness. In September had another minor stroke. Dizziness is constant now when he's upright. Dr. Lucia Gaskins and Dr. Pearlean Brownie all say dizziness from the  CVA. Doesn't have another appointment until February to see Dr. Lucia Gaskins again. Was recommended to try some vestibular rehab. Has to help his wife with transfers at home but then lost his balance one day resulting in a fall. Pt notes some spinning sensations after he has bent over (last episode of spinning occurred this morning). Spinning had started ~3-4 months ago. Pt states he is very unstable without his walker. Vision is blurry and gets lightheaded. Pt reports he currently has HHPT seeing him.  Pt accompanied by: self  PERTINENT HISTORY: Eye stroke (No vision in upper half of L eye), Brainstem CVA x 2  PAIN:  Are you having pain? No  PRECAUTIONS: Fall  RED FLAGS: None   WEIGHT BEARING RESTRICTIONS: No  FALLS: Has patient fallen in last 6 months? Yes. Number of falls 1  LIVING ENVIRONMENT: Lives with: lives with their spouse  Lives in: House/apartment Stairs: 8 steps with hand rail Has following equipment at home: Walker - 4 wheeled  PLOF: Independent  PATIENT GOALS: Decrease dizziness, reduce risk of falls, continue to care take for his wife  OBJECTIVE:  Note: Objective measures were completed at Evaluation unless otherwise noted.  DIAGNOSTIC FINDINGS: Nothing new or recent  COGNITION: Overall cognitive status: Within functional limits for tasks assessed   SENSATION: WFL  POSTURE:  rounded shoulders and increased thoracic kyphosis  Cervical ROM:  Full ROM in flexion and rotation, mildly limited in extension  STRENGTH: Pt reports increased weakness in the last year  LOWER EXTREMITY MMT:   MMT Right eval Left eval  Hip flexion    Hip abduction    Hip adduction    Hip internal rotation    Hip external rotation    Knee flexion    Knee extension    Ankle dorsiflexion    Ankle plantarflexion    Ankle inversion    Ankle eversion    (Blank rows = not tested)  BED MOBILITY:  Independent  TRANSFERS: Independent  GAIT: Gait pattern: step through pattern and  trunk flexed Distance walked: around clinic Assistive device utilized: Environmental consultant - 4 wheeled Level of assistance: Modified independence  FUNCTIONAL TESTS:  5 times sit to stand: TBA  PATIENT SURVEYS:  FOTO 48; predicted 53  VESTIBULAR ASSESSMENT:  GENERAL OBSERVATION: Guarded and slow   SYMPTOM BEHAVIOR:  Subjective history: Rates a baseline dizziness of 6 or 7/10  Non-Vestibular symptoms: changes in vision  Type of dizziness: Blurred Vision, Spinning/Vertigo, Unsteady with head/body turns, and Lightheadedness/Faint  Frequency: Constant, spinning sensations occasionally  Duration: Hours  Aggravating factors: Induced by motion: bending down to the ground, turning body quickly, and turning head quickly  Relieving factors: head stationary and slow movements  Progression of symptoms: unchanged  OCULOMOTOR EXAM:  Ocular Alignment: normal  Ocular ROM: No Limitations  Spontaneous Nystagmus: absent  Gaze-Induced Nystagmus: right beating with left gaze  Smooth Pursuits: saccades  Saccades: slow  FRENZEL - FIXATION SUPRESSED: Did not assess  VESTIBULAR - OCULAR REFLEX:   Slow VOR: Normal  VOR Cancellation: Corrective Saccades more to the L than R  Head-Impulse Test: HIT Right: negative HIT Left: positive   POSITIONAL TESTING: Right Dix-Hallpike: no nystagmus Left Dix-Hallpike: no nystagmus Right Roll Test: no nystagmus Left Roll Test: no nystagmus Right Sidelying: no nystagmus Left Sidelying: no nystagmus  MOTION SENSITIVITY:  Motion Sensitivity Quotient Intensity: 0 = none, 1 = Lightheaded, 2 = Mild, 3 = Moderate, 4 = Severe, 5 = Vomiting  Intensity  1. Sitting to supine   2. Supine to L side   3. Supine to R side   4. Supine to sitting   5. L Hallpike-Dix   6. Up from L    7. R Hallpike-Dix   8. Up from R    9. Sitting, head tipped to L knee   10. Head up from L knee   11. Sitting, head tipped to R knee   12. Head up from R knee   13. Sitting head turns x5    14.Sitting head nods x5   15. In stance, 180 turn to L    16. In stance, 180 turn to R     OTHOSTATICS: not done  FUNCTIONAL GAIT: Dynamic Gait Index: 16/24  [facility] PT Assessment - 02/17/23 0001       Standardized Balance Assessment   Standardized Balance Assessment Dynamic Gait Index  Dynamic Gait Index   Level Surface Mild Impairment    Change in Gait Speed Mild Impairment    Gait with Horizontal Head Turns Mild Impairment    Gait with Vertical Head Turns Normal    Gait and Pivot Turn Mild Impairment    Step Over Obstacle Moderate Impairment    Step Around Obstacles Normal    Steps Moderate Impairment    Total Score 16    DGI comment: 16/24              VESTIBULAR TREATMENT:                                                                                                   DATE: 02/17/23 Did not initiate  PATIENT EDUCATION: Education details: Exam findings, POC Person educated: Patient Education method: Explanation and Demonstration Education comprehension: verbalized understanding and needs further education  HOME EXERCISE PROGRAM: To initiate next session  GOALS: Goals reviewed with patient? Yes  SHORT TERM GOALS: Target date: ***  *** Baseline: Goal status: {GOALSTATUS:25110}  2.  *** Baseline:  Goal status: {GOALSTATUS:25110}  3.  *** Baseline:  Goal status: {GOALSTATUS:25110}  4.  *** Baseline:  Goal status: {GOALSTATUS:25110}  5.  *** Baseline:  Goal status: {GOALSTATUS:25110}  6.  *** Baseline:  Goal status: {GOALSTATUS:25110}  LONG TERM GOALS: Target date: ***  *** Baseline:  Goal status: {GOALSTATUS:25110}  2.  *** Baseline:  Goal status: {GOALSTATUS:25110}  3.  *** Baseline:  Goal status: {GOALSTATUS:25110}  4.  *** Baseline:  Goal status: {GOALSTATUS:25110}  5.  *** Baseline:  Goal status: {GOALSTATUS:25110}  6.  *** Baseline:  Goal status: {GOALSTATUS:25110}  ASSESSMENT:  CLINICAL  IMPRESSION: Patient is a *** y.o. *** who was seen today for physical therapy evaluation and treatment for ***.   OBJECTIVE IMPAIRMENTS: {opptimpairments:25111}.   ACTIVITY LIMITATIONS: {activitylimitations:27494}  PARTICIPATION LIMITATIONS: {participationrestrictions:25113}  PERSONAL FACTORS: {Personal factors:25162} are also affecting patient's functional outcome.   REHAB POTENTIAL: {rehabpotential:25112}  CLINICAL DECISION MAKING: {clinical decision making:25114}  EVALUATION COMPLEXITY: {Evaluation complexity:25115}   PLAN:  PT FREQUENCY: {rehab frequency:25116}  PT DURATION: {rehab duration:25117}  PLANNED INTERVENTIONS: {rehab planned interventions:25118::"97110-Therapeutic exercises","97530- Therapeutic [phone]- Neuromuscular re-education","97535- Self JXBJ","47829- Manual therapy"}  PLAN FOR NEXT SESSION: ***   Terianne Thaker April Ma L Keisha Amer, PT 02/17/2023, 2:53 PM

## 2023-02-19 DIAGNOSIS — I4819 Other persistent atrial fibrillation: Secondary | ICD-10-CM | POA: Diagnosis not present

## 2023-02-19 DIAGNOSIS — H5462 Unqualified visual loss, left eye, normal vision right eye: Secondary | ICD-10-CM | POA: Diagnosis not present

## 2023-02-19 DIAGNOSIS — K219 Gastro-esophageal reflux disease without esophagitis: Secondary | ICD-10-CM | POA: Diagnosis not present

## 2023-02-19 DIAGNOSIS — M79651 Pain in right thigh: Secondary | ICD-10-CM | POA: Diagnosis not present

## 2023-02-19 DIAGNOSIS — R262 Difficulty in walking, not elsewhere classified: Secondary | ICD-10-CM | POA: Diagnosis not present

## 2023-02-19 DIAGNOSIS — R531 Weakness: Secondary | ICD-10-CM | POA: Diagnosis not present

## 2023-02-19 DIAGNOSIS — H04123 Dry eye syndrome of bilateral lacrimal glands: Secondary | ICD-10-CM | POA: Diagnosis not present

## 2023-02-19 DIAGNOSIS — M791 Myalgia, unspecified site: Secondary | ICD-10-CM | POA: Diagnosis not present

## 2023-02-19 DIAGNOSIS — I2585 Chronic coronary microvascular dysfunction: Secondary | ICD-10-CM | POA: Diagnosis not present

## 2023-02-19 DIAGNOSIS — M1990 Unspecified osteoarthritis, unspecified site: Secondary | ICD-10-CM | POA: Diagnosis not present

## 2023-02-19 DIAGNOSIS — I69398 Other sequelae of cerebral infarction: Secondary | ICD-10-CM | POA: Diagnosis not present

## 2023-02-19 DIAGNOSIS — I2510 Atherosclerotic heart disease of native coronary artery without angina pectoris: Secondary | ICD-10-CM | POA: Diagnosis not present

## 2023-02-20 ENCOUNTER — Encounter: Payer: Self-pay | Admitting: Family Medicine

## 2023-02-22 ENCOUNTER — Encounter: Payer: Self-pay | Admitting: Family Medicine

## 2023-02-22 ENCOUNTER — Encounter: Payer: Self-pay | Admitting: Surgery

## 2023-02-22 ENCOUNTER — Ambulatory Visit (INDEPENDENT_AMBULATORY_CARE_PROVIDER_SITE_OTHER): Payer: Medicare Other | Admitting: Surgery

## 2023-02-22 ENCOUNTER — Ambulatory Visit: Payer: Medicare Other | Admitting: Surgery

## 2023-02-22 VITALS — BP 165/95 | HR 87 | Temp 98.6°F | Ht 70.0 in | Wt 182.7 lb

## 2023-02-22 DIAGNOSIS — N401 Enlarged prostate with lower urinary tract symptoms: Secondary | ICD-10-CM | POA: Diagnosis not present

## 2023-02-22 DIAGNOSIS — N200 Calculus of kidney: Secondary | ICD-10-CM | POA: Diagnosis not present

## 2023-02-22 DIAGNOSIS — I6523 Occlusion and stenosis of bilateral carotid arteries: Secondary | ICD-10-CM | POA: Diagnosis not present

## 2023-02-22 DIAGNOSIS — N281 Cyst of kidney, acquired: Secondary | ICD-10-CM | POA: Diagnosis not present

## 2023-02-22 DIAGNOSIS — R350 Frequency of micturition: Secondary | ICD-10-CM | POA: Diagnosis not present

## 2023-02-22 NOTE — Progress Notes (Signed)
[facility] [place]  Patient name: [name] [name] MRN: [id number] DOB: 23-Apr-1935 Sex: male   REASON FOR VISIT:    Thigh pain at night and a buzzing feeling  HISOTRY OF PRESENT ILLNESS:    [name] [name] is a 88 y.o. male who is status post left carotid endarterectomy for symptomatic stenosis with left eye vision loss on 07/08/2020.  He returns today because he has been having issues with right leg thigh discomfort and throbbing at night.  It does not occur with activity.  It does not have any significant remedy.  He does not have claudication symptoms.  He has undergone TAVR through a right groin approach without issues with bleeding, however his symptoms predate this event.  The patient suffers from atrial fibrillation for which he is on Eliquis. He takes a statin for hypercholesterolemia. His most recent LDL was 85. He has a history of coronary artery disease, status post CABG.  PAST MEDICAL HISTORY:   Past Medical History:  Diagnosis Date   Arthritis    Atrial fibrillation, permanent (HCC)    Bladder neoplasm    Chronic cough    NO CARDIAC OR PULMONARY RELATED   Coronary artery disease CARDIOLOGIST-  DR KLEIN/ ALLRED   a. s/p CABG 2004, b. LHC with patent grafts 07/2005, ejection fraction 50%;  c. 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 Excell Seltzer & Leafy Ro.   Severe aortic stenosis      FAMILY HISTORY:    Family History  Problem Relation Age of Onset   Cancer Mother    Stroke Neg Hx     SOCIAL HISTORY:   Social History   Tobacco Use   Smoking status: Former    Current packs/day: 0.00    Average packs/day: 1 pack/day for 25.0 years (25.0 ttl pk-yrs)    Types: Cigarettes    Start date: 04/26/1955    Quit date: 04/25/1980    Years since quitting: 42.8   Smokeless tobacco: Never  Substance Use Topics   Alcohol use: No    Alcohol/week: 0.0 standard drinks of alcohol     ALLERGIES:   Allergies  Allergen Reactions   Ace Inhibitors Other (See Comments)    COUGH   Zocor [Simvastatin] Other (See Comments)    MYALGIA   Nsaids Other (See Comments)    Would avoid given anticoagulation   Prednisone Other (See Comments)    Prednisone caused pt to have elevated BP.   Requip [Ropinirole]     didn't sleep well and had trouble with abnormal dreams   Cephalexin Rash   Chocolate Other (See Comments)    Headaches    Doxycycline Other (See Comments)    Nausea and abd pain   Gabapentin Other (See Comments)    Dizziness   Hycodan [Hydrocodone Bit-Homatrop Mbr] Nausea And Vomiting   Oxycodone Nausea Only    Pt states that he  cannot take prescription pain medication     CURRENT MEDICATIONS:   Current Outpatient Medications  Medication Sig Dispense Refill   acetaminophen (TYLENOL) 325 MG tablet Take 650 mg by mouth once as needed for moderate pain or headache.     amoxicillin (AMOXIL) 500 MG tablet Take 4 tablets (2,000 mg total) by mouth as directed. 1 hour prior to dental work including cleanings 12 tablet 12   apixaban (ELIQUIS) 5 MG TABS tablet Take 1 tablet (5 mg total) by mouth 2 (two) times daily. Take evening dose starting 07/09/20 180 tablet 3   butalbital-acetaminophen-caffeine (FIORICET, ESGIC) 50-325-40 MG per tablet Take 1 tablet by mouth 3 (three) times daily as needed for migraine.      cetirizine (ZYRTEC) 10 MG tablet Take 1 tablet (10 mg total) by mouth daily.      fluticasone (FLONASE) 50 MCG/ACT nasal spray Place 1 spray into both nostrils daily as needed for allergies or rhinitis.     hydrochlorothiazide (HYDRODIURIL) 25 MG tablet Take 25 mg by mouth daily.     latanoprost (XALATAN) 0.005 % ophthalmic solution Place 1 drop into both eyes at bedtime.     meclizine (ANTIVERT) 12.5 MG tablet Take 1 tablet (12.5 mg total) by mouth 2 (two) times daily as needed for dizziness (Sedation caution). 30 tablet 1   melatonin 3 MG TABS tablet Take 3 mg by mouth at bedtime as needed (Sleep).     Multiple Vitamins-Minerals (MULTIVITAMIN WITH MINERALS) tablet Take 1 tablet by mouth daily.     Polyethyl Glycol-Propyl Glycol (SYSTANE ULTRA OP) Place 1-2 drops into both eyes once as needed (dry eyes.).     potassium chloride (KLOR-CON M) 10 MEQ tablet Take 10 mEq by mouth daily.     pravastatin (PRAVACHOL) 80 MG tablet Take 1 tablet (80 mg total) by mouth every evening. 90 tablet 0   ezetimibe (ZETIA) 10 MG tablet Take 1 tablet (10 mg total) by mouth daily. (Patient not taking: Reported on 01/23/2023) 30 tablet 3   No current facility-administered medications for this visit.    REVIEW OF SYSTEMS:   [X]  denotes positive finding, [ ]  denotes negative finding Cardiac  Comments:  Chest pain or chest pressure:    Shortness of breath upon exertion:    Short of breath when lying flat:    Irregular heart rhythm:        Vascular    Pain in calf, thigh, or hip brought on by ambulation:    Pain in feet at night that wakes you up from your sleep:     Blood clot in your veins:    Leg swelling:         Pulmonary    Oxygen at home:    Productive cough:     Wheezing:         Neurologic    Sudden weakness in arms or legs:     Sudden numbness in arms or legs:     Sudden onset of difficulty speaking or slurred speech:    Temporary loss of vision in one eye:     Problems with dizziness:         Gastrointestinal    Blood in stool:     Vomited blood:          Genitourinary    Burning when urinating:     Blood in urine:        Psychiatric    Major depression:  Hematologic    Bleeding problems:    Problems with blood clotting too easily:        Skin    Rashes or ulcers:        Constitutional    Fever or chills:      PHYSICAL EXAM:   Vitals:   02/22/23 0941  BP: (!) 165/95  Pulse: 87  Temp: 98.6 F (37 C)  TempSrc: Temporal  SpO2: 95%  Weight: 182 lb 11.2 oz (82.9 kg)  Height: 5\' 10"  (1.778 m)    GENERAL: The patient is a well-nourished male, in no acute distress. The vital signs are documented above. CARDIAC: There is a regular rate and rhythm.  VASCULAR: Triphasic right pedal Doppler signals.  Palpable right femoral pulse.  No obvious mass in the right groin.  I do not palpate a thrill or hear a bruit.  Trace edema bilaterally PULMONARY: Non-labored respirations ABDOMEN: Soft and non-tender with normal pitched bowel sounds .MUSCULOSKELETAL: There are no major deformities or cyanosis. NEUROLOGIC: No focal weakness or paresthesias are detected. SKIN: There are no ulcers or rashes noted. PSYCHIATRIC: The patient has a normal affect.  STUDIES:   I have reviewed the following: Summary: No evidence of pseudoaneurysm, AVF or DVT  Right lower extremity artery waveforms are triphasic from the groin to the  ankle  level. There is a non vascular, complex heterogeneous cystic structure in  the  right popliteal fossa measuring approximately 4.6 x 1.3 x 2.6cm.   MEDICAL ISSUES:   Carotid: Routine follow-up already scheduled  Right thigh pain: No obvious etiology was identified today.  This is not a vascular problem.  He has excellent Doppler signals in his right foot.  He has had a right recently that was negative for fistula or pseudoaneurysm.  I have encouraged him to try and wear compression socks as he does have trace edema.  This may alleviate some of the symptoms.  I have also asked him to get follow-up with his  primary care physician.    Charlena Cross, MD, FACS [facility] [phone] Pager [phone]

## 2023-02-23 DIAGNOSIS — I69398 Other sequelae of cerebral infarction: Secondary | ICD-10-CM | POA: Diagnosis not present

## 2023-02-23 DIAGNOSIS — R262 Difficulty in walking, not elsewhere classified: Secondary | ICD-10-CM | POA: Diagnosis not present

## 2023-02-23 DIAGNOSIS — M25561 Pain in right knee: Secondary | ICD-10-CM | POA: Diagnosis not present

## 2023-02-23 DIAGNOSIS — R531 Weakness: Secondary | ICD-10-CM | POA: Diagnosis not present

## 2023-02-23 DIAGNOSIS — I2585 Chronic coronary microvascular dysfunction: Secondary | ICD-10-CM | POA: Diagnosis not present

## 2023-02-23 DIAGNOSIS — I4819 Other persistent atrial fibrillation: Secondary | ICD-10-CM | POA: Diagnosis not present

## 2023-02-23 DIAGNOSIS — M25562 Pain in left knee: Secondary | ICD-10-CM | POA: Diagnosis not present

## 2023-02-23 DIAGNOSIS — I2510 Atherosclerotic heart disease of native coronary artery without angina pectoris: Secondary | ICD-10-CM | POA: Diagnosis not present

## 2023-02-25 DIAGNOSIS — H04123 Dry eye syndrome of bilateral lacrimal glands: Secondary | ICD-10-CM | POA: Diagnosis not present

## 2023-02-25 DIAGNOSIS — M79651 Pain in right thigh: Secondary | ICD-10-CM | POA: Diagnosis not present

## 2023-02-25 DIAGNOSIS — Z7901 Long term (current) use of anticoagulants: Secondary | ICD-10-CM | POA: Diagnosis not present

## 2023-02-25 DIAGNOSIS — Z9181 History of falling: Secondary | ICD-10-CM | POA: Diagnosis not present

## 2023-02-25 DIAGNOSIS — E785 Hyperlipidemia, unspecified: Secondary | ICD-10-CM | POA: Diagnosis not present

## 2023-02-25 DIAGNOSIS — M791 Myalgia, unspecified site: Secondary | ICD-10-CM | POA: Diagnosis not present

## 2023-02-25 DIAGNOSIS — K219 Gastro-esophageal reflux disease without esophagitis: Secondary | ICD-10-CM | POA: Diagnosis not present

## 2023-02-25 DIAGNOSIS — I4510 Unspecified right bundle-branch block: Secondary | ICD-10-CM | POA: Diagnosis not present

## 2023-02-25 DIAGNOSIS — H5462 Unqualified visual loss, left eye, normal vision right eye: Secondary | ICD-10-CM | POA: Diagnosis not present

## 2023-02-25 DIAGNOSIS — R531 Weakness: Secondary | ICD-10-CM | POA: Diagnosis not present

## 2023-02-25 DIAGNOSIS — Z7982 Long term (current) use of aspirin: Secondary | ICD-10-CM | POA: Diagnosis not present

## 2023-02-25 DIAGNOSIS — I472 Ventricular tachycardia, unspecified: Secondary | ICD-10-CM | POA: Diagnosis not present

## 2023-02-25 DIAGNOSIS — I2510 Atherosclerotic heart disease of native coronary artery without angina pectoris: Secondary | ICD-10-CM | POA: Diagnosis not present

## 2023-02-25 DIAGNOSIS — Z952 Presence of prosthetic heart valve: Secondary | ICD-10-CM | POA: Diagnosis not present

## 2023-02-25 DIAGNOSIS — I712 Thoracic aortic aneurysm, without rupture, unspecified: Secondary | ICD-10-CM | POA: Diagnosis not present

## 2023-02-25 DIAGNOSIS — Z951 Presence of aortocoronary bypass graft: Secondary | ICD-10-CM | POA: Diagnosis not present

## 2023-02-25 DIAGNOSIS — R262 Difficulty in walking, not elsewhere classified: Secondary | ICD-10-CM | POA: Diagnosis not present

## 2023-02-25 DIAGNOSIS — G629 Polyneuropathy, unspecified: Secondary | ICD-10-CM | POA: Diagnosis not present

## 2023-02-25 DIAGNOSIS — M1990 Unspecified osteoarthritis, unspecified site: Secondary | ICD-10-CM | POA: Diagnosis not present

## 2023-02-25 DIAGNOSIS — I4819 Other persistent atrial fibrillation: Secondary | ICD-10-CM | POA: Diagnosis not present

## 2023-02-25 DIAGNOSIS — Z87891 Personal history of nicotine dependence: Secondary | ICD-10-CM | POA: Diagnosis not present

## 2023-02-25 DIAGNOSIS — Z79899 Other long term (current) drug therapy: Secondary | ICD-10-CM | POA: Diagnosis not present

## 2023-02-25 DIAGNOSIS — Z8551 Personal history of malignant neoplasm of bladder: Secondary | ICD-10-CM | POA: Diagnosis not present

## 2023-02-25 DIAGNOSIS — I2585 Chronic coronary microvascular dysfunction: Secondary | ICD-10-CM | POA: Diagnosis not present

## 2023-02-25 DIAGNOSIS — I69398 Other sequelae of cerebral infarction: Secondary | ICD-10-CM | POA: Diagnosis not present

## 2023-02-28 ENCOUNTER — Telehealth: Payer: Self-pay

## 2023-02-28 ENCOUNTER — Encounter: Payer: Self-pay | Admitting: Internal Medicine

## 2023-02-28 NOTE — Progress Notes (Unsigned)
[facility]   MULTIDISCIPLINARY HEART VALVE TEAM  Structural Heart Office Note:  .    Date:  03/01/2023  ID:  CLEVESTER HELZER, DOB 11/04/1934, MRN [id number] PCP: Joaquim Nam, MD  [facility] Providers Cardiologist:  [name], MD  History of Present Illness: .    DEANGELO BERNS is a 88 y.o. male with a hx of CAD s/p CABG (2004 EBG), HTN, carotid artery disease s/p CEA (06/2020), ventricular tachycardia, permanent atrial fibrillation on Eliquis, stroke and severe aortic stenosis s/p TAVR (11/15/22) who presents today with concerns about bradycardia.    Mr. Fissel had an occipital stroke in 2021. He had carotid endarterectomy in 06/2020. Patient was seen by Dr. 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 He was referred to Dr. Lucia Gaskins with neurology and underwent an extensive work up which was negative. Repeat MRI of the brain showed resolution of abnormalities despite ongoing  dizziness.    Echo with EF 60% and progression of AS into the severe range with mean grad 44 mmHg, AVA 0.54 cm2 as well as mod MR & AI and mild-mod TR. [facility] 10/14/22 showed severe native CAD with patent bypass grafts.   He ultimately underwent TAVR with a 29 mm Edwards Sapien 3 Ultra Resilia THV via the TF approach on 11/15/22. He had an underlying RBBB preop and developed transient CHB post valve inflation and was pacer dependant post procedure. He eventually returned to AF with new LBBB. Pt was seen by EP and offered PPM placement however the patient deferred and was sent home with ZIO AT in place. Monitor resulted with no recurrent CHB and continuous atrial fibrillation with an average HR 76-85 bpm.   The patient then called our team yesterday with concern about his HR after waking in the morning with "heavy breathing" that resolved after ambulation. He took his BP and noted HR was in the 30's. Out of concern he called to  discuss and the patient was added to my schedule.   He is here today alone and states that he overall feels well with no recent change in his baseline dizziness but was concerned about the rates showing on his machine. EKG today with underlying AF with RBBB and bigeminy. Rate is stable. We comparing prior EKGs, he has fluctuated with RBBB and LBBB. Case discussed with Mardelle Matte and Dr. Graciela Husbands at length with tentative plan for PPM implantation. He has been set up for follow up 11/26 and will likely be scheduled 12/6. We did place a ZIO monitor in the meantime to capture additional information. Otherwise, the patient denies chest pain, SOB, palpitations, LE edema, orthopnea, PND, dizziness, or syncope. Denies bleeding in stool or urine.    Physical Exam:   VS:  BP (!) 140/70   Pulse 62   Ht 5\' 10"  (1.778 m)   Wt 184 lb 9.6 oz (83.7 kg)   SpO2 95%   BMI 26.49 kg/m    Wt Readings from Last 3 Encounters:  03/01/23 184 lb 9.6 oz (83.7 kg)  02/22/23 182 lb 11.2 oz (82.9 kg)  02/13/23 175 lb (79.4 kg)    General: Well developed, well nourished, NAD Lungs:Clear to ausculation bilaterally. No wheezes, rales, or rhonchi. Breathing is unlabored. Cardiovascular: Irregularly irregular. No murmurs Extremities: No edema.  Neuro: Alert and oriented. No focal deficits. No facial asymmetry. MAE spontaneously. Psych: Responds to questions appropriately with normal affect.  Risk Assessment/Calculations:    CHA2DS2-VASc Score = 6   This indicates a 9.7% annual risk of stroke. The patient's score is based upon: CHF History: 0 HTN History: 1 Diabetes History: 0 Stroke History: 2 Vascular Disease History: 1 Age Score: 2 Gender Score: 0   ASSESSMENT AND PLAN: .    RBBB/LBBB: Patient with a hx of RBBB in the past with permanent AF followed by Dr. Graciela Husbands. He underwent TAVR c/b post deployment transient pacer dependent CHB. EP consulted while inpatient with recommendation for PPM placement however patient  deferred. LBBB eventually resolved however presents today with concern for bradycardia. EKG with AF with RBBB and bigeminy. Patient remains asymptomatic. ZIO placed with plan to discuss long term plan with EP. After discussion with Dr. Graciela Husbands and Dr. Ladona Ridgel, plan for [facility] placement early December. Scheduled for OV 11/26 for pre-procedure appointment. Dr. Graciela Husbands personally called the patient to discuss and left VM. I will call the patient once again to discuss and answer any questions. ED precautions to be reviewed.   Severe AS s/p TAVR: Doing well with NYHA class I symptoms. Continue Eliquis. One month post TAVR echo with stable valve function with mean gradient at , peak , AVA by VTI 1.43cm2, and DI 0.46. Plan one year follow up with repeat echo.    Dizziness: Chronic and unchanged. Dizziness has been present since occipital stroke. Follows with neurology.    Persistent atrial fibrillation: Previously treated with diltiazem however this was stopped post TAVR. See plan above.    HTN: Stable today with no changes needed at this time.    CAD s/p CABG: pre TAVR LHC with patent bypass grafts. Denies anginal symptoms. Continue medical therapy.    TAA (Ascending thoracic aortic aneurysm): pre TAVR CT scans with dilated ascending thoracic aorta, measuring up to 4.2 cm. Recommend annual imaging followup by CTA or MRA. Plan to forward with Dr. Diona Browner for continued surveillance.    Elevated Raise Score: 3 (age and RBBB). Also has a history of polyneuropathy. Deferred further testing post TAVR.   Signed, Georgie Chard, NP

## 2023-02-28 NOTE — Telephone Encounter (Signed)
The patient sent a my chart message requesting that I contact him by phone to discuss concerns.  Per the pt when he woke up this morning he was lying in bed and noticed that he was breathing heavier than normal and this concerned him and prompted him to check BP and pulse.   5:00 AM 146/63, pulse 30, 5 min later 155/73, 47 10:30 AM 125/98, 68, 15 min later 114/49, 45 11:00 AM went outside and walked around 133/57, 32 and 156/72, 45 2:30 PM 116/55, 47.   Per the pt he has not noticed any other symptoms out of the ordinary. The pt has a long standing history of dizziness everyday when he is up moving around. Per review of recent heart monitor this showed no heart block, AFib with average HR 76-85.  I confirmed with the patient that diltiazem ER is still on hold.  I advised the pt that his home monitor may not be counting his pulse correctly due to AFib being an irregular rhythm. I offered the patient an appointment tomorrow so that an EKG can be performed to assess pulse and rhythm.  Pt will see [name] Chard NP in the morning and plans to bring his home monitor (pt notes that his machine is indicating that the battery needs to be changed).

## 2023-03-01 ENCOUNTER — Other Ambulatory Visit: Payer: Self-pay | Admitting: Cardiology

## 2023-03-01 ENCOUNTER — Ambulatory Visit (INDEPENDENT_AMBULATORY_CARE_PROVIDER_SITE_OTHER): Payer: Medicare Other

## 2023-03-01 ENCOUNTER — Telehealth: Payer: Self-pay | Admitting: Internal Medicine

## 2023-03-01 ENCOUNTER — Ambulatory Visit: Payer: Medicare Other | Attending: Cardiology | Admitting: Cardiology

## 2023-03-01 ENCOUNTER — Encounter: Payer: Self-pay | Admitting: Internal Medicine

## 2023-03-01 VITALS — BP 140/70 | HR 62 | Ht 70.0 in | Wt 184.6 lb

## 2023-03-01 DIAGNOSIS — R42 Dizziness and giddiness: Secondary | ICD-10-CM

## 2023-03-01 DIAGNOSIS — I4891 Unspecified atrial fibrillation: Secondary | ICD-10-CM

## 2023-03-01 DIAGNOSIS — R531 Weakness: Secondary | ICD-10-CM | POA: Diagnosis not present

## 2023-03-01 DIAGNOSIS — I10 Essential (primary) hypertension: Secondary | ICD-10-CM | POA: Diagnosis not present

## 2023-03-01 DIAGNOSIS — I2510 Atherosclerotic heart disease of native coronary artery without angina pectoris: Secondary | ICD-10-CM | POA: Diagnosis not present

## 2023-03-01 DIAGNOSIS — I63231 Cerebral infarction due to unspecified occlusion or stenosis of right carotid arteries: Secondary | ICD-10-CM

## 2023-03-01 DIAGNOSIS — I4821 Permanent atrial fibrillation: Secondary | ICD-10-CM | POA: Diagnosis not present

## 2023-03-01 DIAGNOSIS — I442 Atrioventricular block, complete: Secondary | ICD-10-CM

## 2023-03-01 DIAGNOSIS — I4819 Other persistent atrial fibrillation: Secondary | ICD-10-CM | POA: Diagnosis not present

## 2023-03-01 DIAGNOSIS — I69398 Other sequelae of cerebral infarction: Secondary | ICD-10-CM | POA: Diagnosis not present

## 2023-03-01 DIAGNOSIS — Z8673 Personal history of transient ischemic attack (TIA), and cerebral infarction without residual deficits: Secondary | ICD-10-CM | POA: Insufficient documentation

## 2023-03-01 DIAGNOSIS — Z952 Presence of prosthetic heart valve: Secondary | ICD-10-CM | POA: Insufficient documentation

## 2023-03-01 DIAGNOSIS — I4510 Unspecified right bundle-branch block: Secondary | ICD-10-CM | POA: Insufficient documentation

## 2023-03-01 DIAGNOSIS — I2585 Chronic coronary microvascular dysfunction: Secondary | ICD-10-CM | POA: Diagnosis not present

## 2023-03-01 DIAGNOSIS — R262 Difficulty in walking, not elsewhere classified: Secondary | ICD-10-CM | POA: Diagnosis not present

## 2023-03-01 DIAGNOSIS — I447 Left bundle-branch block, unspecified: Secondary | ICD-10-CM | POA: Insufficient documentation

## 2023-03-01 NOTE — Patient Instructions (Signed)
Medication Instructions:  Your physician recommends that you continue on your current medications as directed. Please refer to the Current Medication list given to you today.  *If you need a refill on your cardiac medications before your next appointment, please call your pharmacy*  Testing/Procedures: Your physician has recommended that you wear an event monitor. Event monitors are medical devices that record the heart's electrical activity. Doctors most often [place] these monitors to diagnose arrhythmias. Arrhythmias are problems with the speed or rhythm of the heartbeat. The monitor is a small, portable device. You can wear one while you do your normal daily activities. This is usually used to diagnose what is causing palpitations/syncope (passing out).  Follow-Up: At [facility], you and your health needs are our priority.  As part of our continuing mission to provide you with exceptional heart care, we have created designated Provider Care Teams.  These Care Teams include your primary Cardiologist (physician) and Advanced Practice Providers (APPs -  Physician Assistants and Nurse Practitioners) who all work together to provide you with the care you need, when you need it.   Your next appointment:   3-4 weeks  Provider:   You may see Dr. Elberta Fortis or one of the following Advanced Practice Providers on your designated Care Team:   Francis Dowse, PA-C Casimiro Needle "Mardelle Matte" [place], [place] Sherie Don, NP Canary Brim, NP    Other Instructions Christena Deem- Long Term Monitor Instructions  Your physician has requested you wear a ZIO patch monitor for 14 days.  This is a single patch monitor. [employer] supplies one patch monitor per enrollment. Additional stickers are not available. Please do not apply patch if you will be having a Nuclear Stress Test,  Echocardiogram, Cardiac CT, MRI, or Chest Xray during the period you would be wearing the  monitor. The patch cannot be worn during these tests. You  cannot remove and re-apply the  ZIO XT patch monitor.  Your ZIO patch monitor will be mailed 3 day USPS to your address on file. It may take 3-5 days  to receive your monitor after you have been enrolled.  Once you have received your monitor, please review the enclosed instructions. Your monitor  has already been registered assigning a specific monitor serial # to you.  Billing and Patient Assistance Program Information  We have supplied [employer] with any of your insurance information on file for billing purposes. [employer] offers a sliding scale Patient Assistance Program for patients that do not have  insurance, or whose insurance does not completely cover the cost of the ZIO monitor.  You must apply for the Patient Assistance Program to qualify for this discounted rate.  To apply, please call [employer] at [phone], select option 4, select option 2, ask to apply for  Patient Assistance Program. Meredeth Ide will ask your household income, and how many people  are in your household. They will quote your out-of-pocket cost based on that information.  [employer] will also be able to set up a 63-month, interest-free payment plan if needed.  Applying the monitor   Shave hair from upper left chest.  Hold abrader disc by orange tab. Rub abrader in 40 strokes over the upper left chest as  indicated in your monitor instructions.  Clean area with 4 enclosed alcohol pads. Let dry.  Apply patch as indicated in monitor instructions. Patch will be placed under collarbone on left  side of chest with arrow pointing upward.  Rub patch adhesive wings for 2 minutes. Remove white label  marked "1". Remove the white  label marked "2". Rub patch adhesive wings for 2 additional minutes.  While looking in a mirror, press and release button in center of patch. A small green light will  flash 3-4 times. This will be your only indicator that the monitor has been turned on.  Do not shower for the first 24 hours. You may  shower after the first 24 hours.  Press the button if you feel a symptom. You will hear a small click. Record Date, Time and  Symptom in the Patient Logbook.  When you are ready to remove the patch, follow instructions on the last 2 pages of Patient  Logbook. Stick patch monitor onto the last page of Patient Logbook.  Place Patient Logbook in the blue and white box. Use locking tab on box and tape box closed  securely. The blue and white box has prepaid postage on it. Please place it in the mailbox as  soon as possible. Your physician should have your test results approximately 7 days after the  monitor has been mailed back to [facility].  Call [facility] Customer Care at [phone] if you have questions regarding  your ZIO XT patch monitor. Call them immediately if you see an orange light blinking on your  monitor.  If your monitor falls off in less than 4 days, contact our Monitor department at [phone].  If your monitor becomes loose or falls off after 4 days call [employer] at [phone] for  suggestions on securing your monitor

## 2023-03-01 NOTE — Progress Notes (Signed)
Called and spoke with patient Record HR and BP lying, sitting standing and standing@ 3 min, as his major complaint is orthostatic dizziness VS 9/16 showed orthostatic hypertension  ECG also show transient RBBB 5/24 and this resolved prior to TAVR--the procedure cx by LBBB and now has redeveloped RBBB so needs pacing for alternating BBB  Dilt stopped at TAVR

## 2023-03-01 NOTE — Telephone Encounter (Signed)
Dr Graciela Husbands has spoken with pt.  See his documentation for complete details.  Pt to RTC 03/02/2023 for BMET and Mg.  Orders placed.

## 2023-03-01 NOTE — Telephone Encounter (Signed)
Pt calling requesting cb. States he missed a call from Dr. Graciela Husbands

## 2023-03-01 NOTE — Progress Notes (Unsigned)
ZIO XT serial # [id number] from office inventory applied to patient.  Dr. Graciela Husbands to read.

## 2023-03-02 ENCOUNTER — Encounter: Payer: Self-pay | Admitting: Internal Medicine

## 2023-03-03 ENCOUNTER — Encounter: Payer: Self-pay | Admitting: Family Medicine

## 2023-03-03 ENCOUNTER — Other Ambulatory Visit: Payer: Self-pay | Admitting: *Deleted

## 2023-03-03 DIAGNOSIS — I4821 Permanent atrial fibrillation: Secondary | ICD-10-CM | POA: Diagnosis not present

## 2023-03-03 DIAGNOSIS — I4510 Unspecified right bundle-branch block: Secondary | ICD-10-CM | POA: Diagnosis not present

## 2023-03-03 DIAGNOSIS — R42 Dizziness and giddiness: Secondary | ICD-10-CM | POA: Diagnosis not present

## 2023-03-04 LAB — BASIC METABOLIC PANEL
BUN/Creatinine Ratio: 22 (ref 10–24)
BUN: 22 mg/dL (ref 8–27)
CO2: 28 mmol/L (ref 20–29)
Calcium: 9.2 mg/dL (ref 8.6–10.2)
Chloride: 102 mmol/L (ref 96–106)
Creatinine, Ser: 1 mg/dL (ref 0.76–1.27)
Glucose: 86 mg/dL (ref 70–99)
Potassium: 4 mmol/L (ref 3.5–5.2)
Sodium: 141 mmol/L (ref 134–144)
eGFR: 73 mL/min/{1.73_m2} (ref >59)

## 2023-03-04 LAB — MAGNESIUM: Magnesium: 2.3 mg/dL (ref 1.6–2.3)

## 2023-03-05 ENCOUNTER — Other Ambulatory Visit: Payer: Self-pay | Admitting: Family Medicine

## 2023-03-07 DIAGNOSIS — I2585 Chronic coronary microvascular dysfunction: Secondary | ICD-10-CM | POA: Diagnosis not present

## 2023-03-07 DIAGNOSIS — I2510 Atherosclerotic heart disease of native coronary artery without angina pectoris: Secondary | ICD-10-CM | POA: Diagnosis not present

## 2023-03-07 DIAGNOSIS — I69398 Other sequelae of cerebral infarction: Secondary | ICD-10-CM | POA: Diagnosis not present

## 2023-03-07 DIAGNOSIS — R262 Difficulty in walking, not elsewhere classified: Secondary | ICD-10-CM | POA: Diagnosis not present

## 2023-03-07 DIAGNOSIS — R531 Weakness: Secondary | ICD-10-CM | POA: Diagnosis not present

## 2023-03-07 DIAGNOSIS — I4819 Other persistent atrial fibrillation: Secondary | ICD-10-CM | POA: Diagnosis not present

## 2023-03-08 DIAGNOSIS — D485 Neoplasm of uncertain behavior of skin: Secondary | ICD-10-CM | POA: Diagnosis not present

## 2023-03-08 DIAGNOSIS — L718 Other rosacea: Secondary | ICD-10-CM | POA: Diagnosis not present

## 2023-03-08 DIAGNOSIS — D0461 Carcinoma in situ of skin of right upper limb, including shoulder: Secondary | ICD-10-CM | POA: Diagnosis not present

## 2023-03-08 DIAGNOSIS — Z85828 Personal history of other malignant neoplasm of skin: Secondary | ICD-10-CM | POA: Diagnosis not present

## 2023-03-08 DIAGNOSIS — D225 Melanocytic nevi of trunk: Secondary | ICD-10-CM | POA: Diagnosis not present

## 2023-03-08 DIAGNOSIS — L821 Other seborrheic keratosis: Secondary | ICD-10-CM | POA: Diagnosis not present

## 2023-03-08 DIAGNOSIS — L570 Actinic keratosis: Secondary | ICD-10-CM | POA: Diagnosis not present

## 2023-03-08 DIAGNOSIS — C44619 Basal cell carcinoma of skin of left upper limb, including shoulder: Secondary | ICD-10-CM | POA: Diagnosis not present

## 2023-03-08 DIAGNOSIS — C44319 Basal cell carcinoma of skin of other parts of face: Secondary | ICD-10-CM | POA: Diagnosis not present

## 2023-03-12 ENCOUNTER — Other Ambulatory Visit: Payer: Self-pay | Admitting: Family Medicine

## 2023-03-12 MED ORDER — ROPINIROLE HCL 0.25 MG PO TABS: 0.2500 mg | ORAL_TABLET | Freq: Every day | ORAL | 2 refills | Status: DC

## 2023-03-15 ENCOUNTER — Other Ambulatory Visit: Payer: Self-pay | Admitting: Family Medicine

## 2023-03-15 ENCOUNTER — Telehealth: Payer: Self-pay | Admitting: Internal Medicine

## 2023-03-15 DIAGNOSIS — I10 Essential (primary) hypertension: Secondary | ICD-10-CM | POA: Diagnosis not present

## 2023-03-15 DIAGNOSIS — R42 Dizziness and giddiness: Secondary | ICD-10-CM | POA: Diagnosis not present

## 2023-03-15 NOTE — Telephone Encounter (Signed)
Abnormal report. Please advise

## 2023-03-15 NOTE — Telephone Encounter (Signed)
Call received from from [employer] regarding final Zio results which have posted.  Pt with Slow Afib on 03/05/2023 - 309am at 39bpm lasting for 60 seconds and Symptomatic brady on 03/05/2023 - 328am at 38bpm last 30 seconds. Pt is scheduled to see Otilio Saber, PA-C on 03/21/2023 for evaluation of PPM implant on 03/27/23 with Dr Graciela Husbands.

## 2023-03-20 NOTE — Progress Notes (Unsigned)
  Electrophysiology Office Note:   Date:  03/21/2023  ID:  [name], [name] Sep 20, 1934, MRN [id number]  Primary Cardiologist: [name], MD Electrophysiologist: None      History of Present Illness:   [name] [name] is a 89 y.o. male with h/o CAD s/p CABG (2004 EBG), HTN, carotid artery disease s/p CEA (06/2020), ventricular tachycardia, permanent atrial fibrillation on Eliquis, stroke and severe aortic stenosis s/p TAVR (11/15/22)  seen today for routine electrophysiology followup.   Seen in clinic for TVAR follow up 11/6 and noted to have alternating bundle branch block. He was having some increased dizziness and planned follow up with referral to EP. Dr. Graciela Husbands reviewed EKG and chart which showed alternating bundle branch block and indicated for PPM. Monitor placed to look for higher grade block or bradycardia while waiting.  Since last being seen in our clinic the patient reports doing about the same. Continues to have dizziness most days. Had several days of HRs in the 30s, but has since settled out mostly into the 40s by home chart. No syncope. Denies SOB. Cares for his wife and will need to be able to lift here. Feels like he can manage this fine with just his R arm.   Review of systems complete and found to be negative unless listed in HPI.   EP Information / Studies Reviewed:    EKG is ordered today. Personal review as below.        Echo 09/26/2022 EF 60% and progression of AS into the severe range with mean grad 44 mmHg, AVA 0.54 cm2 as well as mod MR & AI and mild-mod TR.   [facility] 10/14/22 showed severe native CAD with patent bypass grafts.   Physical Exam:   VS:  There were no vitals taken for this visit.   Wt Readings from Last 3 Encounters:  03/01/23 184 lb 9.6 oz (83.7 kg)  02/22/23 182 lb 11.2 oz (82.9 kg)  02/13/23 175 lb (79.4 kg)     GEN: Well nourished, well developed in no acute distress NECK: No JVD; No carotid bruits CARDIAC: Irregularly irregular  rate and rhythm, no murmurs, rubs, gallops RESPIRATORY:  Clear to auscultation without rales, wheezing or rhonchi  ABDOMEN: Soft, non-tender, non-distended EXTREMITIES:  No edema; No deformity   ASSESSMENT AND PLAN:    Transient CHB  AF with slow rates Symptomatic bradycardia Pt had transient CHB s/p TAVR and has continued to have low rates and symptomatic bradycardia at time.  Explained risks, benefits, and alternatives to PPM implantation, including but not limited to bleeding, infection, pneumothorax, pericardial effusion, lead dislodgement, heart attack, stroke, or death.  Pt verbalized understanding and agrees to proceed.       S/p TAVR Otherwise stable course.   HTN Add hydralazine 25 mg daily while waiting for cath   Follow up with Dr. Graciela Husbands as usual post procedure  Signed, Graciella Freer, PA-C

## 2023-03-20 NOTE — H&P (View-Only) (Signed)
  Electrophysiology Office Note:   Date:  03/21/2023  ID:  Prayag, Stueck 1934-08-25, MRN [id number]  Primary Cardiologist: [name], MD Electrophysiologist: None      History of Present Illness:   [name] [name] is a 88 y.o. male with h/o CAD s/p CABG (2004 EBG), HTN, carotid artery disease s/p CEA (06/2020), ventricular tachycardia, permanent atrial fibrillation on Eliquis, stroke and severe aortic stenosis s/p TAVR (11/15/22)  seen today for routine electrophysiology followup.   Seen in clinic for TVAR follow up 11/6 and noted to have alternating bundle branch block. He was having some increased dizziness and planned follow up with referral to EP. Dr. Graciela Husbands reviewed EKG and chart which showed alternating bundle branch block and indicated for PPM. Monitor placed to look for higher grade block or bradycardia while waiting.  Since last being seen in our clinic the patient reports doing about the same. Continues to have dizziness most days. Had several days of HRs in the 30s, but has since settled out mostly into the 40s by home chart. No syncope. Denies SOB. Cares for his wife and will need to be able to lift here. Feels like he can manage this fine with just his R arm.   Review of systems complete and found to be negative unless listed in HPI.   EP Information / Studies Reviewed:    EKG is ordered today. Personal review as below.        Echo 09/26/2022 EF 60% and progression of AS into the severe range with mean grad 44 mmHg, AVA 0.54 cm2 as well as mod MR & AI and mild-mod TR.   [facility] 10/14/22 showed severe native CAD with patent bypass grafts.   Physical Exam:   VS:  There were no vitals taken for this visit.   Wt Readings from Last 3 Encounters:  03/01/23 184 lb 9.6 oz (83.7 kg)  02/22/23 182 lb 11.2 oz (82.9 kg)  02/13/23 175 lb (79.4 kg)     GEN: Well nourished, well developed in no acute distress NECK: No JVD; No carotid bruits CARDIAC: Irregularly irregular  rate and rhythm, no murmurs, rubs, gallops RESPIRATORY:  Clear to auscultation without rales, wheezing or rhonchi  ABDOMEN: Soft, non-tender, non-distended EXTREMITIES:  No edema; No deformity   ASSESSMENT AND PLAN:    Transient CHB  AF with slow rates Symptomatic bradycardia Pt had transient CHB s/p TAVR and has continued to have low rates and symptomatic bradycardia at time.  Explained risks, benefits, and alternatives to PPM implantation, including but not limited to bleeding, infection, pneumothorax, pericardial effusion, lead dislodgement, heart attack, stroke, or death.  Pt verbalized understanding and agrees to proceed.       S/p TAVR Otherwise stable course.   HTN Add hydralazine 25 mg daily while waiting for cath   Follow up with Dr. Graciela Husbands as usual post procedure  Signed, Graciella Freer, PA-C

## 2023-03-21 ENCOUNTER — Ambulatory Visit: Payer: Medicare Other | Attending: Student | Admitting: Student

## 2023-03-21 ENCOUNTER — Encounter: Payer: Self-pay | Admitting: *Deleted

## 2023-03-21 ENCOUNTER — Encounter: Payer: Self-pay | Admitting: Student

## 2023-03-21 VITALS — BP 186/90 | HR 50 | Ht 70.0 in | Wt 194.4 lb

## 2023-03-21 DIAGNOSIS — I4510 Unspecified right bundle-branch block: Secondary | ICD-10-CM | POA: Insufficient documentation

## 2023-03-21 DIAGNOSIS — I4821 Permanent atrial fibrillation: Secondary | ICD-10-CM | POA: Insufficient documentation

## 2023-03-21 DIAGNOSIS — I442 Atrioventricular block, complete: Secondary | ICD-10-CM | POA: Diagnosis not present

## 2023-03-21 DIAGNOSIS — Z952 Presence of prosthetic heart valve: Secondary | ICD-10-CM | POA: Diagnosis not present

## 2023-03-21 MED ORDER — HYDRALAZINE HCL 25 MG PO TABS: 25.0000 mg | ORAL_TABLET | Freq: Every day | ORAL | 3 refills | Status: DC

## 2023-03-21 NOTE — Patient Instructions (Signed)
Medication Instructions:  Start hydralazine 25 mg daily. *If you need a refill on your cardiac medications before your next appointment, please call your pharmacy*  Lab Work: BMET, CBC-TODAY If you have labs (blood work) drawn today and your tests are completely normal, you will receive your results only by: MyChart Message (if you have MyChart) OR A paper copy in the mail If you have any lab test that is abnormal or we need to change your treatment, we will call you to review the results.  Testing/Procedures: See letter  Follow-Up: At [facility], you and your health needs are our priority.  As part of our continuing mission to provide you with exceptional heart care, we have created designated Provider Care Teams.  These Care Teams include your primary Cardiologist (physician) and Advanced Practice Providers (APPs -  Physician Assistants and Nurse Practitioners) who all work together to provide you with the care you need, when you need it.   Your next appointment:   Follow up appointments will be scheduled and print out on your discharge summary after your procedure.

## 2023-03-22 ENCOUNTER — Encounter: Payer: Self-pay | Admitting: Student

## 2023-03-22 LAB — BASIC METABOLIC PANEL
BUN/Creatinine Ratio: 22 (ref 10–24)
BUN: 20 mg/dL (ref 8–27)
CO2: 26 mmol/L (ref 20–29)
Calcium: 8.9 mg/dL (ref 8.6–10.2)
Chloride: 104 mmol/L (ref 96–106)
Creatinine, Ser: 0.91 mg/dL (ref 0.76–1.27)
Glucose: 102 mg/dL — ABNORMAL HIGH (ref 70–99)
Potassium: 3.6 mmol/L (ref 3.5–5.2)
Sodium: 144 mmol/L (ref 134–144)
eGFR: 82 mL/min/{1.73_m2} (ref >59)

## 2023-03-22 LAB — CBC
Hematocrit: 33.6 % — ABNORMAL LOW (ref 37.5–51.0)
Hemoglobin: 11 g/dL — ABNORMAL LOW (ref 13.0–17.7)
MCH: 32.1 pg (ref 26.6–33.0)
MCHC: 32.7 g/dL (ref 31.5–35.7)
MCV: 98 fL — ABNORMAL HIGH (ref 79–97)
Platelets: 156 10*3/uL (ref 150–450)
RBC: 3.43 x10E6/uL — ABNORMAL LOW (ref 4.14–5.80)
RDW: 13.1 % (ref 11.6–15.4)
WBC: 5.1 10*3/uL (ref 3.4–10.8)

## 2023-03-22 NOTE — Telephone Encounter (Signed)
Called and spoke with patient to share Andy's message:   If he feels like he is having an allergic reaction he should be checked out in the ED. Especially if having any SOB, wheezing, or hives.   Patient denies any SOB, wheezing or hives, verbalized understanding of when to go to ED for evaluation.  Advised patient to monitor BP at home and to notify our office if his BP is consistently greater than 140/90. Patient verbalized understanding and expressed appreciation for call.  Hydralazine removed from medication list.

## 2023-03-26 ENCOUNTER — Emergency Department (HOSPITAL_COMMUNITY)
Admission: EM | Admit: 2023-03-26 | Discharge: 2023-03-26 | Disposition: A | Discharge status: Home / Self Care | Payer: Medicare Other

## 2023-03-26 ENCOUNTER — Encounter: Payer: Self-pay | Admitting: Internal Medicine

## 2023-03-26 ENCOUNTER — Encounter (HOSPITAL_COMMUNITY): Payer: Self-pay

## 2023-03-26 ENCOUNTER — Other Ambulatory Visit: Payer: Self-pay

## 2023-03-26 ENCOUNTER — Telehealth: Payer: Self-pay | Admitting: Family Medicine

## 2023-03-26 DIAGNOSIS — I213 ST elevation (STEMI) myocardial infarction of unspecified site: Secondary | ICD-10-CM | POA: Diagnosis not present

## 2023-03-26 DIAGNOSIS — I10 Essential (primary) hypertension: Secondary | ICD-10-CM | POA: Diagnosis not present

## 2023-03-26 DIAGNOSIS — Z7901 Long term (current) use of anticoagulants: Secondary | ICD-10-CM | POA: Insufficient documentation

## 2023-03-26 DIAGNOSIS — R42 Dizziness and giddiness: Secondary | ICD-10-CM | POA: Diagnosis not present

## 2023-03-26 DIAGNOSIS — R001 Bradycardia, unspecified: Secondary | ICD-10-CM | POA: Insufficient documentation

## 2023-03-26 DIAGNOSIS — I491 Atrial premature depolarization: Secondary | ICD-10-CM | POA: Diagnosis not present

## 2023-03-26 DIAGNOSIS — D649 Anemia, unspecified: Secondary | ICD-10-CM

## 2023-03-26 DIAGNOSIS — R0689 Other abnormalities of breathing: Secondary | ICD-10-CM | POA: Diagnosis not present

## 2023-03-26 LAB — BASIC METABOLIC PANEL
Anion gap: 10 (ref 5–15)
BUN: 23 mg/dL (ref 8–23)
CO2: 26 mmol/L (ref 22–32)
Calcium: 8.8 mg/dL — ABNORMAL LOW (ref 8.9–10.3)
Chloride: 104 mmol/L (ref 98–111)
Creatinine, Ser: 1.03 mg/dL (ref 0.61–1.24)
GFR, Estimated: >60 mL/min (ref >60)
Glucose, Bld: 115 mg/dL — ABNORMAL HIGH (ref 70–99)
Potassium: 3.3 mmol/L — ABNORMAL LOW (ref 3.5–5.1)
Sodium: 140 mmol/L (ref 135–145)

## 2023-03-26 LAB — CBC
HCT: 33.1 % — ABNORMAL LOW (ref 39.0–52.0)
Hemoglobin: 10.9 g/dL — ABNORMAL LOW (ref 13.0–17.0)
MCH: 31.8 pg (ref 26.0–34.0)
MCHC: 32.9 g/dL (ref 30.0–36.0)
MCV: 96.5 fL (ref 80.0–100.0)
Platelets: 173 10*3/uL (ref 150–400)
RBC: 3.43 MIL/uL — ABNORMAL LOW (ref 4.22–5.81)
RDW: 14.6 % (ref 11.5–15.5)
WBC: 5.1 10*3/uL (ref 4.0–10.5)
nRBC: 0 % (ref 0.0–0.2)

## 2023-03-26 LAB — TSH: TSH: 2.546 u[IU]/mL (ref 0.350–4.500)

## 2023-03-26 LAB — MAGNESIUM: Magnesium: 2.1 mg/dL (ref 1.7–2.4)

## 2023-03-26 MED ORDER — IRON (FERROUS SULFATE) 325 (65 FE) MG PO TABS: 325.0000 mg | ORAL_TABLET | Freq: Every day | ORAL | Status: AC

## 2023-03-26 NOTE — ED Triage Notes (Addendum)
Pt to ED via [facility] from home c/o dizziness and confusion (Recalling events) x several days, reports worse with position changes.   Reports HR 30-40S, Per EMS, this is pt baseline and has upcoming about with cards in 1 week 12/9,  for pacemaker.   Cbg 112  Last VS" 195/85, 97%RA  No medications given by EMS. #[id number]  Pt A&O

## 2023-03-26 NOTE — ED Notes (Signed)
Pt d/c home per EDP order. Discharge summary reviewed, pt verbalizes understanding. Off unit via WC- No s/s of acute distress noted, reports neighbor is discharge ride home.

## 2023-03-26 NOTE — Telephone Encounter (Signed)
Please check with patient.  I saw his ER note.  His HGB was lower than prior.  If he isn't able to get in with cardiology in the near future, then I would like to recheck his labs here at a lab visit.  If any bleeding or blood in his stools, then please let me know/seek care.    I would try taking OTC ferrous sulfate 325 mg daily to help with his hemoglobin and also to see if that helped with his leg symptoms.  It can cause black/dark stools.  Thanks.

## 2023-03-26 NOTE — Discharge Instructions (Addendum)
Please call your cardiologist office tomorrow.  Please continue your blood pressure medication.  Please return to the emergency department immediately for worsening symptoms as we discussed.

## 2023-03-26 NOTE — ED Notes (Signed)
E-Signature pad not working, pt gives verbal consent

## 2023-03-26 NOTE — ED Provider Notes (Signed)
[place] EMERGENCY DEPARTMENT AT [facility] Provider Note   CSN: [id number] Arrival date & time: 03/26/23  1558     History  Chief Complaint  Patient presents with   Dizziness   Altered Mental Status    [name] [name] is a 88 y.o. male.  88 year old male with past medical history of bradycardia with plans for pacemaker on 9 December presenting to the emergency department today with lightheadedness and episode this morning of confusion.  The patient states that he has been having some intermittent lightheadedness over the past few weeks which is why has been worked up and scheduled for the pacemaker.  The patient states that he has been having some intermittent bradycardia since he had an aortic valve replacement.  He states that today it was little worse than normal.  He had an episode this morning where he was feeling a little lightheaded.  He was trying to think of a password for his tablet and could not remember this.  He states that he feels like he is back in his normal state of health in regards to his memory at this time.  He denies any focal weakness, numbness, or tingling.  He denies any associated chest pain.  He denies being started on any new medications.  Denies any blood in his stool or dark stools.  He is currently on Eliquis.   Dizziness Altered Mental Status Associated symptoms: light-headedness        Home Medications Prior to Admission medications   Medication Sig Start Date End Date Taking? Authorizing Provider  acetaminophen (TYLENOL) 325 MG tablet Take 650 mg by mouth once as needed for moderate pain or headache.    [provider]  amoxicillin (AMOXIL) 500 MG tablet Take 4 tablets (2,000 mg total) by mouth as directed. 1 hour prior to dental work including cleanings 11/25/22   Janetta Hora, PA-C  apixaban (ELIQUIS) 5 MG TABS tablet Take 1 tablet (5 mg total) by mouth 2 (two) times daily. Take evening dose starting 07/09/20 07/09/20    Baglia, Corrina, PA-C  butalbital-acetaminophen-caffeine (FIORICET, ESGIC) 50-325-40 MG per tablet Take 1 tablet by mouth 3 (three) times daily as needed for migraine.     [provider]  cetirizine (ZYRTEC) 10 MG tablet Take 1 tablet (10 mg total) by mouth daily. 10/29/21   Joaquim Nam, MD  fluticasone [facility]) 50 MCG/ACT nasal spray Place 1 spray into both nostrils daily as needed for allergies or rhinitis. 01/23/23   Joaquim Nam, MD  hydrochlorothiazide (HYDRODIURIL) 25 MG tablet Take 25 mg by mouth daily. 07/01/09   [provider]  latanoprost (XALATAN) 0.005 % ophthalmic solution Place 1 drop into both eyes at bedtime. 07/01/20   [provider]  melatonin 3 MG TABS tablet Take 3 mg by mouth at bedtime as needed (Sleep).    [provider]  Multiple Vitamins-Minerals (MULTIVITAMIN WITH MINERALS) tablet Take 1 tablet by mouth daily.    [provider]  Polyethyl Glycol-Propyl Glycol (SYSTANE ULTRA OP) Place 1-2 drops into both eyes once as needed (dry eyes.).    [provider]  potassium chloride (KLOR-CON M) 10 MEQ tablet Take 10 mEq by mouth daily.    [provider]  pravastatin (PRAVACHOL) 80 MG tablet Take 1 tablet (80 mg total) by mouth every evening. 01/01/21   Duke Salvia, MD      Allergies    Hydralazine hcl, Ace inhibitors, Zocor [simvastatin], Nsaids, Prednisone, Requip [ropinirole],  Cephalexin, Chocolate, Doxycycline, Gabapentin, Hycodan [hydrocodone bit-homatrop mbr], and Oxycodone    Review of Systems   Review of Systems  Neurological:  Positive for light-headedness. Negative for dizziness.  All other systems reviewed and are negative.   Physical Exam Updated Vital Signs BP (!) 218/77   Pulse (!) 42   Temp 98.1 F (36.7 C) (Oral)   Resp 19   Ht 5\' 10"  (1.778 m)   Wt 81.6 kg   SpO2 94%   BMI 25.83 kg/m  Physical Exam Vitals and nursing note reviewed.   Gen: NAD Eyes: PERRL, EOMI HEENT: no  oropharyngeal swelling Neck: trachea midline Resp: clear to auscultation bilaterally Card: Bradycardic, no murmurs, rubs, or gallops Abd: nontender, nondistended Extremities: no calf tenderness, no edema Vascular: 2+ radial pulses bilaterally, 2+ DP pulses bilaterally Skin: no rashes Psyc: acting appropriately   ED Results / Procedures / Treatments   Labs (all labs ordered are listed, but only abnormal results are displayed) Labs Reviewed  BASIC METABOLIC PANEL - Abnormal; Notable for the following components:      Result Value   Potassium 3.3 (*)    Glucose, Bld 115 (*)    Calcium 8.8 (*)    All other components within normal limits  CBC - Abnormal; Notable for the following components:   RBC 3.43 (*)    Hemoglobin 10.9 (*)    HCT 33.1 (*)    All other components within normal limits  TSH  MAGNESIUM    EKG None  Radiology No results found.  Procedures Procedures    Medications Ordered in ED Medications - No data to display  ED Course/ Medical Decision Making/ A&P                                 Medical Decision Making 88 year old male with past medical history of atrial fibrillation on Eliquis as well as bradycardia who is scheduled for pacemaker placement on the ninth presenting to the emergency department today with lightheadedness.  Patient also reports some mild confusion earlier which has since resolved.  In regards to confusion suspect is likely due to hypoperfusion.  He does seem to be back to normal now.  He does not have any focal deficits to suggest CVA at this time.  He is mentating clearly at his baseline currently.  I will further evaluate him here with basic labs Wels and EKG.  Will also obtain a magnesium level to evaluate for electrolyte abnormalities or anemia.  Discussed this case with cardiology after his workup is complete given the worsening symptoms with plans already to have the pacemaker placed.  Currently, he is hypertensive.  Will hold off on  treating this at this time given the patient's bradycardia.  I will reevaluate for ultimate disposition.  The patient's EKG interpreted by me does show a bradycardic rhythm with a right bundle branch block morphology.  I do not appreciate any P waves this material may be a junctional rhythm.  On the monitor to the patient appears to be in a sinus bradycardia with occasional PVCs with a rate in the 40s.  The patient's labs here are unremarkable.  His symptoms have resolved.  He is requesting discharge.  I discussed with the patient that with his symptoms that the most conservative course of action would be to admit him to the hospital for further monitoring.  I did explain to the patient that I could call his  cardiologist and they would likely recommend that he be admitted for further monitoring to see if he could have a pacemaker sooner.  The patient states that he does not want to stay here in the hospital.  He has to get home to take care of his wife.  I did explain to the patient that with heart block and bradycardia that if these do worsen that they can lead to syncope and even death.  He does understand this but does not want to stay in the hospital.  He is requesting discharge.  He states that he does have the appointment in 1 week to have the pacemaker performed.  His blood pressure is elevated here.  He reports that this has been elevated at home and he has been speaking with his cardiologist.  They did start him on hydralazine but he did not tolerate this.  He was told to continue his HCTZ and to follow-up.  Ultimately, the patient is discharged through shared decision making.  His neurologic exam remains reassuring.  He is alert and oriented and does have capacity to make his own medical decisions.  He is discharged with return precautions.  I have encouraged him to call his cardiologist tomorrow to see if they would like to get him into the office this week or 2 get him in for catheterization sooner.   I will place a consult in our system here to see if I can help facilitate this.  He is ultimately discharged through shared decision making.  His BP is 123/110 on recheck.  Amount and/or Complexity of Data Reviewed Labs: ordered.           Final Clinical Impression(s) / ED Diagnoses Final diagnoses:  Symptomatic bradycardia    Rx / DC Orders ED Discharge Orders          Ordered    Ambulatory referral to Cardiology       Comments: If you have not heard from the Cardiology office within the next 72 hours please call [phone].   03/26/23 1753              Durwin Glaze, MD 03/26/23 1755

## 2023-03-27 ENCOUNTER — Encounter: Payer: Self-pay | Admitting: Internal Medicine

## 2023-03-28 ENCOUNTER — Telehealth: Payer: Self-pay | Admitting: Internal Medicine

## 2023-03-28 NOTE — Telephone Encounter (Signed)
Patient has misplace the soap he needs before the surgery. Calling to see how he can get some more. Please advise

## 2023-03-28 NOTE — Telephone Encounter (Signed)
Spoke with [name] [name] and advised he may pick up another bottle of surgical scrub at the front desk of Dr Odessa Fleming office.  [name] [name] verbalized understanding and thanked Charity fundraiser for the call.

## 2023-03-28 NOTE — Telephone Encounter (Signed)
Spoke with patient and advised of the notes. He states that he has his pacemaker implant scheduled for Monday.

## 2023-03-29 NOTE — Telephone Encounter (Signed)
Noted. Thanks.

## 2023-03-29 NOTE — Telephone Encounter (Signed)
Spoke with patient about scheduling him to come in for nonfasting labs. Patient declined only because he is feeling progressively worse and moving around and do a lot is becoming a bit much. But once he has his pacemaker implanted on Monday he will call later in the week to schedule and try to come in next week. He wants to get the pacemaker implanted first.

## 2023-03-29 NOTE — Telephone Encounter (Signed)
Noted.  Please see if he can come by for nonfasting labs later this week, would need a lab visit scheduled.  Thanks.

## 2023-03-30 NOTE — Telephone Encounter (Signed)
  Message reviewed ECG from ER RBBB  HR 42  Called and spoke to patient Increasing unsteadiness and dizziness Yday HR 48 and BP 160 Advised that is Heart escape is unstable with RBBB in the context of previous LBBB and now has CHB as evidenced by RR regularity He is not able to come to hospital as he has no one to care for his wife He is aware of recommendations And we hope to take care of this on Monday

## 2023-03-31 NOTE — Pre-Procedure Instructions (Signed)
Instructed patient on the following items: Arrival time 0730 Nothing to eat or drink after midnight No meds AM of procedure Responsible person to drive you home and stay with you for 24 hrs Wash with special soap night before and morning of procedure If on anti-coagulant drug instructions Eliquis- last dose tonight 12/6

## 2023-04-03 ENCOUNTER — Ambulatory Visit (HOSPITAL_COMMUNITY): Payer: Medicare Other

## 2023-04-03 ENCOUNTER — Other Ambulatory Visit: Payer: Self-pay

## 2023-04-03 ENCOUNTER — Ambulatory Visit (HOSPITAL_COMMUNITY)
Admission: RE | Admit: 2023-04-03 | Discharge: 2023-04-03 | Disposition: A | Discharge status: Home / Self Care | Payer: Medicare Other | Attending: Internal Medicine | Admitting: Internal Medicine

## 2023-04-03 ENCOUNTER — Encounter (HOSPITAL_COMMUNITY)
Admission: RE | Disposition: A | Discharge status: Home / Self Care | Payer: Self-pay | Source: Home / Self Care | Attending: Internal Medicine

## 2023-04-03 DIAGNOSIS — Z952 Presence of prosthetic heart valve: Secondary | ICD-10-CM | POA: Diagnosis not present

## 2023-04-03 DIAGNOSIS — I10 Essential (primary) hypertension: Secondary | ICD-10-CM | POA: Diagnosis not present

## 2023-04-03 DIAGNOSIS — I442 Atrioventricular block, complete: Secondary | ICD-10-CM | POA: Diagnosis not present

## 2023-04-03 DIAGNOSIS — I4821 Permanent atrial fibrillation: Secondary | ICD-10-CM | POA: Insufficient documentation

## 2023-04-03 DIAGNOSIS — I4510 Unspecified right bundle-branch block: Secondary | ICD-10-CM

## 2023-04-03 DIAGNOSIS — R001 Bradycardia, unspecified: Secondary | ICD-10-CM | POA: Insufficient documentation

## 2023-04-03 DIAGNOSIS — D649 Anemia, unspecified: Secondary | ICD-10-CM

## 2023-04-03 DIAGNOSIS — R0989 Other specified symptoms and signs involving the circulatory and respiratory systems: Secondary | ICD-10-CM | POA: Diagnosis not present

## 2023-04-03 DIAGNOSIS — I517 Cardiomegaly: Secondary | ICD-10-CM | POA: Diagnosis not present

## 2023-04-03 HISTORY — PX: PACEMAKER IMPLANT: EP1218

## 2023-04-03 LAB — BASIC METABOLIC PANEL
Anion gap: 9 (ref 5–15)
BUN: 15 mg/dL (ref 8–23)
CO2: 26 mmol/L (ref 22–32)
Calcium: 8.8 mg/dL — ABNORMAL LOW (ref 8.9–10.3)
Chloride: 103 mmol/L (ref 98–111)
Creatinine, Ser: 1.05 mg/dL (ref 0.61–1.24)
GFR, Estimated: >60 mL/min (ref >60)
Glucose, Bld: 94 mg/dL (ref 70–99)
Potassium: 2.9 mmol/L — ABNORMAL LOW (ref 3.5–5.1)
Sodium: 138 mmol/L (ref 135–145)

## 2023-04-03 LAB — RETICULOCYTES
Immature Retic Fract: 19.4 % — ABNORMAL HIGH (ref 2.3–15.9)
RBC.: 3.51 MIL/uL — ABNORMAL LOW (ref 4.22–5.81)
Retic Count, Absolute: 82.5 10*3/uL (ref 19.0–186.0)
Retic Ct Pct: 2.4 % (ref 0.4–3.1)

## 2023-04-03 SURGERY — PACEMAKER IMPLANT

## 2023-04-03 MED ORDER — POTASSIUM CHLORIDE ER 10 MEQ PO TBCR
30.0000 meq | EXTENDED_RELEASE_TABLET | ORAL | Status: AC
  Administered 2023-04-03 (×2): 30 meq via ORAL
  Filled 2023-04-03: qty 3

## 2023-04-03 MED ORDER — ONDANSETRON HCL 4 MG/2ML IJ SOLN: 4.0000 mg | Freq: Four times a day (QID) | INTRAMUSCULAR | Status: DC | PRN

## 2023-04-03 MED ORDER — POTASSIUM CHLORIDE CRYS ER 20 MEQ PO TBCR
40.0000 meq | EXTENDED_RELEASE_TABLET | Freq: Once | ORAL | Status: AC
  Administered 2023-04-03: 40 meq via ORAL
  Filled 2023-04-03: qty 2

## 2023-04-03 MED ORDER — SODIUM CHLORIDE 0.9 % IV SOLN
INTRAVENOUS | Status: DC
Start: 2023-04-03 — End: 2023-04-03

## 2023-04-03 MED ORDER — POTASSIUM CHLORIDE 10 MEQ/100ML IV SOLN
10.0000 meq | Freq: Once | INTRAVENOUS | Status: AC
  Administered 2023-04-03: 10 meq via INTRAVENOUS
  Filled 2023-04-03: qty 100

## 2023-04-03 MED ORDER — VANCOMYCIN HCL IN DEXTROSE 1-5 GM/200ML-% IV SOLN: 1000.0000 mg | INTRAVENOUS | Status: AC

## 2023-04-03 MED ORDER — LABETALOL HCL 5 MG/ML IV SOLN
INTRAVENOUS | Status: AC
  Filled 2023-04-03: qty 4

## 2023-04-03 MED ORDER — POTASSIUM CHLORIDE CRYS ER 20 MEQ PO TBCR: 40.0000 meq | EXTENDED_RELEASE_TABLET | ORAL | Status: DC

## 2023-04-03 MED ORDER — POTASSIUM CHLORIDE CRYS ER 20 MEQ PO TBCR: 40.0000 meq | EXTENDED_RELEASE_TABLET | Freq: Once | ORAL | Status: DC

## 2023-04-03 MED ORDER — FUROSEMIDE 10 MG/ML IJ SOLN
INTRAMUSCULAR | Status: AC
  Filled 2023-04-03: qty 4

## 2023-04-03 MED ORDER — SODIUM CHLORIDE 0.9 % IV SOLN: INTRAVENOUS | Status: DC

## 2023-04-03 MED ORDER — ACETAMINOPHEN 325 MG PO TABS: 325.0000 mg | ORAL_TABLET | ORAL | Status: DC | PRN

## 2023-04-03 MED ORDER — LIDOCAINE HCL (PF) 1 % IJ SOLN
INTRAMUSCULAR | Status: DC | PRN
  Administered 2023-04-03: 10 mL

## 2023-04-03 MED ORDER — FUROSEMIDE 10 MG/ML IJ SOLN
INTRAMUSCULAR | Status: DC | PRN
  Administered 2023-04-03: 40 mg via INTRAVENOUS

## 2023-04-03 MED ORDER — MIDAZOLAM HCL 2 MG/2ML IJ SOLN
INTRAMUSCULAR | Status: AC
  Filled 2023-04-03: qty 2

## 2023-04-03 MED ORDER — VANCOMYCIN HCL IN DEXTROSE 1-5 GM/200ML-% IV SOLN
INTRAVENOUS | Status: AC
  Administered 2023-04-03: 1000 mg via INTRAVENOUS
  Filled 2023-04-03: qty 200

## 2023-04-03 MED ORDER — LABETALOL HCL 5 MG/ML IV SOLN
INTRAVENOUS | Status: DC | PRN
  Administered 2023-04-03: 10 mg via INTRAVENOUS
  Administered 2023-04-03: 20 mg via INTRAVENOUS

## 2023-04-03 MED ORDER — FENTANYL CITRATE (PF) 100 MCG/2ML IJ SOLN
INTRAMUSCULAR | Status: AC
  Filled 2023-04-03: qty 2

## 2023-04-03 MED ORDER — SODIUM CHLORIDE 0.9 % IV SOLN
INTRAVENOUS | Status: AC
  Filled 2023-04-03: qty 2

## 2023-04-03 MED ORDER — LIDOCAINE HCL (PF) 1 % IJ SOLN
INTRAMUSCULAR | Status: AC
  Filled 2023-04-03: qty 60

## 2023-04-03 MED ORDER — SODIUM CHLORIDE 0.9 % IV SOLN
80.0000 mg | INTRAVENOUS | Status: AC
  Administered 2023-04-03: 80 mg

## 2023-04-03 MED ORDER — HEPARIN (PORCINE) IN NACL 1000-0.9 UT/500ML-% IV SOLN
INTRAVENOUS | Status: DC | PRN
  Administered 2023-04-03 (×2): 500 mL

## 2023-04-03 SURGICAL SUPPLY — 17 items
ADAPTER SEALING SSA-EW-09 (ADAPTER) IMPLANT
CABLE SURGICAL S-101-97-12 (CABLE) ×1 IMPLANT
CATH HIS SELECTSITE C304HIS (CATHETERS) IMPLANT
CATH RIGHTSITE C315HIS02 (CATHETERS) IMPLANT
IPG PACE AZUR XT SR MRI W1SR01 (Pacemaker) IMPLANT
LEAD CAPSURE NOVUS 5076-58CM (Lead) IMPLANT
LEAD ISOFLEX OPT 1948-58CM (Lead) IMPLANT
LEAD SELECT SECURE 3830 383069 (Lead) IMPLANT
PACE AZURE XT SR MRI W1SR01 (Pacemaker) ×1 IMPLANT
PACEMAKER STYLET BLUE (MISCELLANEOUS) IMPLANT
PAD DEFIB RADIO PHYSIO CONN (PAD) ×1 IMPLANT
SELECT SECURE 3830 383069 (Lead) ×1 IMPLANT
SHEATH 7FR PRELUDE SNAP 13 (SHEATH) IMPLANT
SHEATH 9FR PRELUDE SNAP 13 (SHEATH) IMPLANT
TOOL TRANSVALV INSERT TVI-07 (MISCELLANEOUS) IMPLANT
TRAY PACEMAKER INSERTION (PACKS) ×1 IMPLANT
WIRE HI TORQ VERSACORE-J 145CM (WIRE) IMPLANT

## 2023-04-03 NOTE — Progress Notes (Signed)
Order clarification on potassium, pt to have additional 60 meq post procedure, I informed Janne Napoleon Rn of the clarification.

## 2023-04-03 NOTE — Progress Notes (Signed)
Pts CVP eleveated > 15 at time of implant; edema on exam  Gave furosemide 40 mg IV  Had KCL 10 IV And 40 preop  Had late coupled PVCs concerning for afterdepolarizations, so paced at 80 leaving the lab And will give KCL another 30+30  Will need to check blood work later this week

## 2023-04-03 NOTE — Interval H&P Note (Signed)
History and Physical Interval Note:  04/03/2023 7:49 AM  [name] [name]  has presented today for surgery, with the diagnosis of bradicardia.  The various methods of treatment have been discussed with the patient and family. After consideration of risks, benefits and other options for treatment, the patient has consented to  Procedure(s): PACEMAKER IMPLANT (N/A) as a surgical intervention.  The patient's history has been reviewed, patient examined, no change in status, stable for surgery.  I have reviewed the patient's chart and labs.  Questions were answered to the patient's satisfaction.     [name] [name]  Admitted for pacemaker--single chamber-- for alternating bundle branch block in setting of permanent atrial fibrillation The benefits and risks were reviewed including but not limited to death,  perforation, infection, lead dislodgement and device malfunction.  The patient understands agrees and is willing to proceed.

## 2023-04-03 NOTE — Discharge Instructions (Addendum)
After Your Pacemaker   You have a Medtronic Pacemaker  ACTIVITY Do not lift your arm above shoulder height for 1 week after your procedure. After 7 days, you may progress as below.  You should remove your sling 24 hours after your procedure, unless otherwise instructed by your provider.     Monday April 10, 2023  Tuesday April 11, 2023 Wednesday April 12, 2023 Thursday April 13, 2023   Do not lift, push, pull, or carry anything over 10 pounds with the affected arm until 6 weeks (Monday May 15, 2023 ) after your procedure.   You may drive AFTER your wound check, unless you have been told otherwise by your provider.   Ask your healthcare provider when you can go back to work   INCISION/Dressing If you are on a blood thinner such as , Eliquis, please confirm with your provider when this should be resumed. Thur 12/12  Monitor your Pacemaker site for redness, swelling, and drainage. Call the device clinic at [phone] if you experience these symptoms or fever/chills.  If your incision is closed with Dermabond/Surgical glue. You may shower 1 day after your pacemaker implant and wash around the site with soap and water.    If you were discharged in a sling, please do not wear this during the day more than 48 hours after your surgery unless otherwise instructed. This may increase the risk of stiffness and soreness in your shoulder.   Avoid lotions, ointments, or perfumes over your incision until it is well-healed.  You may use a hot tub or a pool AFTER your wound check appointment if the incision is completely closed.  Pacemaker Alerts:  Some alerts are vibratory and others beep. These are NOT emergencies. Please call our office to let us know. If this occurs at night or on weekends, it can wait until the next business day. Send a remote transmission.  If your device is capable of reading fluid status (for heart failure), you will be offered monthly monitoring to review  this with you.   DEVICE MANAGEMENT Remote monitoring is used to monitor your pacemaker from home. This monitoring is scheduled every 91 days by our office. It allows [place] to keep an eye on the functioning of your device to ensure it is working properly. You will routinely see your Electrophysiologist annually (more often if necessary).   You should receive your ID card for your new device in 4-8 weeks. Keep this card with you at all times once received. Consider wearing a medical alert bracelet or necklace.  Your Pacemaker may be MRI compatible. This will be discussed at your next office visit/wound check.  You should avoid contact with strong electric or magnetic fields.   Do not use amateur (ham) radio equipment or electric (arc) welding torches. MP3 player headphones with magnets should not be used. Some devices are safe to use if held at least 12 inches (30 cm) from your Pacemaker. These include power tools, lawn mowers, and speakers. If you are unsure if something is safe to use, ask your health care provider.  When using your cell phone, hold it to the ear that is on the opposite side from the Pacemaker. Do not leave your cell phone in a pocket over the Pacemaker.  You may safely use electric blankets, heating pads, computers, and microwave ovens.  Call the office right away if: You have chest pain. You feel more short of breath than you have felt before. You feel more light-headed than  you have felt before. Your incision starts to open up.  This information is not intended to replace advice given to you by your health care provider. Make sure you discuss any questions you have with your health care provider.

## 2023-04-03 NOTE — Progress Notes (Addendum)
Pt is reminded to keep left arm down and stop moving the shoulder, sling adjusted again.  1740 Dr Graciela Husbands reviewed cxr, pt can be discharged home.

## 2023-04-04 ENCOUNTER — Telehealth: Payer: Self-pay

## 2023-04-04 ENCOUNTER — Encounter (HOSPITAL_COMMUNITY): Payer: Self-pay | Admitting: Internal Medicine

## 2023-04-04 ENCOUNTER — Encounter: Payer: Self-pay | Admitting: Internal Medicine

## 2023-04-04 ENCOUNTER — Telehealth: Payer: Self-pay | Admitting: Internal Medicine

## 2023-04-04 DIAGNOSIS — R42 Dizziness and giddiness: Secondary | ICD-10-CM

## 2023-04-04 DIAGNOSIS — E876 Hypokalemia: Secondary | ICD-10-CM

## 2023-04-04 MED FILL — Midazolam HCl Inj 2 MG/2ML (Base Equivalent): INTRAMUSCULAR | Qty: 2 | Status: AC

## 2023-04-04 MED FILL — Fentanyl Citrate Preservative Free (PF) Inj 100 MCG/2ML: INTRAMUSCULAR | Qty: 2 | Status: AC

## 2023-04-04 NOTE — Telephone Encounter (Signed)
Follow-up after same day discharge: Implant date: 04/03/2023 MD: [name], MD Device: Medtronic Azure Xt SR MRI Location: Left Chest   Wound check visit: 04/20/2023 90 day MD follow-up: 07/10/2022  Remote Transmission received:yes  Dressing/sling removed: yes  Confirm OAC restart on: 12/12  Please continue to monitor your cardiac device site for redness, swelling, and drainage. Call the device clinic at [phone] if you experience these symptoms, fever/chills, or have questions about your device.   Remote monitoring is used to monitor your cardiac device from home. This monitoring is scheduled every 91 days by our office. It allows [place] to keep an eye on the functioning of your device to ensure it is working properly.

## 2023-04-04 NOTE — Telephone Encounter (Signed)
Dr Graciela Husbands has returned call pt

## 2023-04-04 NOTE — Telephone Encounter (Signed)
The pt blood pressure is 161/60 and heart rate is 76 bpm. I also let him speak with Baird Lyons about his transmission.

## 2023-04-04 NOTE — Progress Notes (Unsigned)
Slept well  Unstable on feet Without dyspnea  Will have him check BP/HR

## 2023-04-04 NOTE — Telephone Encounter (Signed)
Pt stated he received a call from Dr. Graciela Husbands this morning to f/u on the procedure that was done yesterday but missed it since he was sleeping. He'd like to speak with Mindi Junker about some questions he has after having his procedure. Please advise

## 2023-04-04 NOTE — Telephone Encounter (Signed)
Per Dr Graciela Husbands dizziness is not related to blood pressure or device - Pt should increase fluids tonight, BMET tomorrow and if dizziness continues pt should be further evaluated in the UC or the ED.  Pt verbalizes understanding and agrees with current plan.

## 2023-04-04 NOTE — Telephone Encounter (Signed)
Spoke w/ pt. Transmission WDL.

## 2023-04-05 ENCOUNTER — Other Ambulatory Visit: Payer: Self-pay

## 2023-04-05 DIAGNOSIS — E876 Hypokalemia: Secondary | ICD-10-CM | POA: Diagnosis not present

## 2023-04-05 DIAGNOSIS — R42 Dizziness and giddiness: Secondary | ICD-10-CM

## 2023-04-06 LAB — BASIC METABOLIC PANEL
BUN/Creatinine Ratio: 20 (ref 10–24)
BUN: 18 mg/dL (ref 8–27)
CO2: 27 mmol/L (ref 20–29)
Calcium: 8.8 mg/dL (ref 8.6–10.2)
Chloride: 102 mmol/L (ref 96–106)
Creatinine, Ser: 0.91 mg/dL (ref 0.76–1.27)
Glucose: 125 mg/dL — ABNORMAL HIGH (ref 70–99)
Potassium: 3.7 mmol/L (ref 3.5–5.2)
Sodium: 142 mmol/L (ref 134–144)
eGFR: 82 mL/min/{1.73_m2} (ref >59)

## 2023-04-07 ENCOUNTER — Encounter: Payer: Self-pay | Admitting: Internal Medicine

## 2023-04-09 ENCOUNTER — Encounter: Payer: Self-pay | Admitting: Internal Medicine

## 2023-04-10 ENCOUNTER — Ambulatory Visit: Payer: Medicare Other | Attending: Cardiology

## 2023-04-10 VITALS — BP 160/60 | HR 66

## 2023-04-10 DIAGNOSIS — I10 Essential (primary) hypertension: Secondary | ICD-10-CM | POA: Diagnosis not present

## 2023-04-10 DIAGNOSIS — I442 Atrioventricular block, complete: Secondary | ICD-10-CM | POA: Diagnosis not present

## 2023-04-10 MED ORDER — CLONIDINE HCL 0.1 MG PO TABS
0.1000 mg | ORAL_TABLET | Freq: Once | ORAL | Status: AC
  Administered 2023-04-10: 0.1 mg via ORAL

## 2023-04-10 MED ORDER — VERAPAMIL HCL ER 180 MG PO TBCR: 180.0000 mg | EXTENDED_RELEASE_TABLET | Freq: Every day | ORAL | 6 refills | Status: DC

## 2023-04-10 NOTE — Patient Instructions (Addendum)
Your wound has noticeable swelling in the device pocket. We have applied a pressure dressing to the area. Keep the bandage in place and DO NOT shower until we see you back for follow up.   Also, your blood pressure is high today and likely contributing to your symptoms. We gave you 2 doses of Clonidine in office today which lowered your readings.  If your blood pressure continues to elevate 200/90,100 and/or you experience severe symptoms: chest pain, shortness of breath or near to passing out symptoms, go to the ER.   Dr. Odessa Fleming instructions today:   Stop hydrochlorothiazide HOLD ELIQUIS - do not take until Friday pm dose then restart.  Start Verapamil 180mg , take 1 pill daily at bedtime (START TONIGHT) Come back to clinic next week on Monday, 04/17/23 at 11:45am.  Keep a daily record of your blood pressure readings and call Dr. Crawford Givens on Friday with your results. Also, make him aware of your increased urination issues.   If you have any questions or concerns, call our office at [phone].  After hours, [phone] for on call.

## 2023-04-11 NOTE — Progress Notes (Unsigned)
Patient in today for evaluation of device wound site swelling and bleeding.  While in office he reports that he has acute worsening of his dizziness, headache and blurry vision.  BP's have been elevated at home 160-208/80-100's.  Today in office his BP is 203/92.   Wound site presents with hematoma in pocket, no active bleeding.  Patient admits that on his own he has been taking ASA along with his Eliquis over the past week due to "restless legs".  Wound is healing WNL otherwise.  Reviewed with Dr. Graciela Husbands.  I applied large pressure dressing to site, patient is to refrain from getting area wet and leave in place until recheck next week on Monday, 12/23.    Blood pressure/acute symptoms.

## 2023-04-12 ENCOUNTER — Encounter: Payer: Self-pay | Admitting: Internal Medicine

## 2023-04-12 LAB — CUP PACEART INCLINIC DEVICE CHECK
Date Time Interrogation Session: 20241216100234
Implantable Lead Connection Status: 753985
Implantable Lead Implant Date: 20241209
Implantable Lead Location: 753860
Implantable Lead Model: 5076
Implantable Pulse Generator Implant Date: 20241209

## 2023-04-12 NOTE — Telephone Encounter (Signed)
The pt called stating the pressure dressing is really tight and he can't sleep. I let him speak with Boneta Lucks, rn.

## 2023-04-12 NOTE — Telephone Encounter (Signed)
Spoke with Pt.  He is having trouble tolerating the pressure dressing applied for hematoma.  States he has been unable to sleep.    Discussed the rationale for the pressure dressing and risks/benefits of removing it.  Will have Pt come for device clinic appointment tomorrow 04/13/2023.  Will remove dressing and reevaluate continued need for dressing.  Pt scheduled at 1:00 pm.

## 2023-04-13 ENCOUNTER — Encounter: Payer: Self-pay | Admitting: Internal Medicine

## 2023-04-13 ENCOUNTER — Ambulatory Visit: Payer: Medicare Other | Attending: Cardiology

## 2023-04-13 DIAGNOSIS — I442 Atrioventricular block, complete: Secondary | ICD-10-CM

## 2023-04-13 NOTE — Progress Notes (Signed)
Pt seen in device clinic for pressure dressing removal.  Pressure dressing removed.  Swelling over device has improved since last evaluated.  Hematoma in the healing stages.  No new bleeding noted.  Incision is approximated.  No drainage or s/s of infection.  Some bruising noted along left rib cage from pressure dressing.  Patient with 2 small superficial skin tear areas along left rib cage with pressure dressing removal.  He is advised he can shower and apply antibiotic appointment post shower to the 2 areas as needed.    Pt advised to restart Eliquis 04/14/2023 PM dose.   He is advised to continue to monitor for increased swelling, new bruising or any s/s of infection/drainage.  Follow up scheduled for wound check with interrogation 04/16/2024.

## 2023-04-13 NOTE — Patient Instructions (Signed)
Resume your Eliquis tomorrow evening 04/14/2023.  Continue to monitor for increased swelling, new bruising or any signs or symptoms of infection.  Keep your follow up appointment on 04/17/2023.

## 2023-04-14 ENCOUNTER — Encounter (INDEPENDENT_AMBULATORY_CARE_PROVIDER_SITE_OTHER): Payer: Medicare Other | Admitting: Ophthalmology

## 2023-04-14 DIAGNOSIS — H348322 Tributary (branch) retinal vein occlusion, left eye, stable: Secondary | ICD-10-CM

## 2023-04-14 DIAGNOSIS — H35033 Hypertensive retinopathy, bilateral: Secondary | ICD-10-CM | POA: Diagnosis not present

## 2023-04-14 DIAGNOSIS — H34232 Retinal artery branch occlusion, left eye: Secondary | ICD-10-CM

## 2023-04-14 DIAGNOSIS — I10 Essential (primary) hypertension: Secondary | ICD-10-CM

## 2023-04-14 DIAGNOSIS — H43813 Vitreous degeneration, bilateral: Secondary | ICD-10-CM | POA: Diagnosis not present

## 2023-04-15 ENCOUNTER — Encounter: Payer: Self-pay | Admitting: Family Medicine

## 2023-04-17 ENCOUNTER — Ambulatory Visit: Payer: Medicare Other | Admitting: Surgery

## 2023-04-17 ENCOUNTER — Ambulatory Visit: Payer: Medicare Other | Attending: Internal Medicine

## 2023-04-17 DIAGNOSIS — I442 Atrioventricular block, complete: Secondary | ICD-10-CM

## 2023-04-17 LAB — CUP PACEART INCLINIC DEVICE CHECK
Battery Remaining Longevity: 152 mo
Battery Voltage: 3.21 V
Brady Statistic RV Percent Paced: 94.35 %
Date Time Interrogation Session: 20241223130557
Implantable Lead Connection Status: 753985
Implantable Lead Implant Date: 20241209
Implantable Lead Location: 753860
Implantable Lead Model: 5076
Implantable Pulse Generator Implant Date: 20241209
Lead Channel Impedance Value: 380 Ohm
Lead Channel Impedance Value: 532 Ohm
Lead Channel Pacing Threshold Amplitude: 0.625 V
Lead Channel Pacing Threshold Pulse Width: 0.4 ms
Lead Channel Sensing Intrinsic Amplitude: 12.125 mV
Lead Channel Sensing Intrinsic Amplitude: 12.125 mV
Lead Channel Setting Pacing Amplitude: 3.5 V
Lead Channel Setting Pacing Pulse Width: 0.4 ms
Lead Channel Setting Sensing Sensitivity: 2 mV
Zone Setting Status: 755011

## 2023-04-17 NOTE — Telephone Encounter (Signed)
Called patient have put on schedule tomorrow per Dr. Para March request. Patient will reach out if any change in symptoms or questions.

## 2023-04-17 NOTE — Telephone Encounter (Signed)
Can he get scheduled tomorrow in clinic?  We could recheck his BP and his HGB and make plans going forward.  Thanks.

## 2023-04-17 NOTE — Patient Instructions (Signed)
   After Your Pacemaker   Monitor your pacemaker site for redness, swelling, and drainage. Call the device clinic at [phone] if you experience these symptoms or fever/chills.  Your incision was closed with Dermabond:  You may shower 1 day after your defibrillator implant and wash your incision with soap and water. Avoid lotions, ointments, or perfumes over your incision until it is well-healed.  You may use a hot tub or a pool after your wound check appointment if the incision is completely closed.  Do not lift, push or pull greater than 10 pounds with the affected arm until 6 weeks after your procedure. UNTIL AFTER JANUARY 20TH. There are no other restrictions in arm movement after your wound check appointment.  You may drive, unless driving has been restricted by your healthcare providers.  Remote monitoring is used to monitor your pacemaker from home. This monitoring is scheduled every 91 days by our office. It allows [place] to keep an eye on the functioning of your device to ensure it is working properly. You will routinely see your Electrophysiologist annually (more often if necessary).

## 2023-04-17 NOTE — Progress Notes (Signed)
Wound check appointment. Dermabond removed. Small, old, hematoma site that continues to heal noted. Incision edges approximated, wound well healed. Normal device function. Thresholds, sensing, and impedances consistent with implant measurements. Device programmed at 3.5V/auto capture programmed on for extra safety margin until 3 month visit. Histogram distribution appropriate for patient and level of activity. No high ventricular rates noted. Patient educated about wound care, arm mobility, lifting restrictions. ROV in 3 months with implanting physician.  BP readings improving after 1 week of Verapamil. Pt continues to c/o weakness and dizziness. No correlation with device. Outreach made to primary care physcians office today they are to following up with patient for next steps.

## 2023-04-18 ENCOUNTER — Ambulatory Visit (INDEPENDENT_AMBULATORY_CARE_PROVIDER_SITE_OTHER): Payer: Medicare Other | Admitting: Family Medicine

## 2023-04-18 ENCOUNTER — Encounter: Payer: Self-pay | Admitting: Family Medicine

## 2023-04-18 VITALS — BP 172/74 | HR 69 | Temp 97.8°F | Ht 70.0 in | Wt 187.0 lb

## 2023-04-18 DIAGNOSIS — I10 Essential (primary) hypertension: Secondary | ICD-10-CM | POA: Diagnosis not present

## 2023-04-18 DIAGNOSIS — D649 Anemia, unspecified: Secondary | ICD-10-CM

## 2023-04-18 LAB — CBC WITH DIFFERENTIAL/PLATELET
Basophils Absolute: 0 10*3/uL (ref 0.0–0.1)
Basophils Relative: 0.6 % (ref 0.0–3.0)
Eosinophils Absolute: 0.1 10*3/uL (ref 0.0–0.7)
Eosinophils Relative: 2.7 % (ref 0.0–5.0)
HCT: 32.5 % — ABNORMAL LOW (ref 39.0–52.0)
Hemoglobin: 10.9 g/dL — ABNORMAL LOW (ref 13.0–17.0)
Lymphocytes Relative: 20.7 % (ref 12.0–46.0)
Lymphs Abs: 1 10*3/uL (ref 0.7–4.0)
MCHC: 33.5 g/dL (ref 30.0–36.0)
MCV: 97.1 fL (ref 78.0–100.0)
Monocytes Absolute: 0.6 10*3/uL (ref 0.1–1.0)
Monocytes Relative: 12.7 % — ABNORMAL HIGH (ref 3.0–12.0)
Neutro Abs: 3.2 10*3/uL (ref 1.4–7.7)
Neutrophils Relative %: 63.3 % (ref 43.0–77.0)
Platelets: 191 10*3/uL (ref 150.0–400.0)
RBC: 3.35 Mil/uL — ABNORMAL LOW (ref 4.22–5.81)
RDW: 15.9 % — ABNORMAL HIGH (ref 11.5–15.5)
WBC: 5.1 10*3/uL (ref 4.0–10.5)

## 2023-04-18 LAB — BASIC METABOLIC PANEL
BUN: 19 mg/dL (ref 6–23)
CO2: 28 meq/L (ref 19–32)
Calcium: 8.8 mg/dL (ref 8.4–10.5)
Chloride: 104 meq/L (ref 96–112)
Creatinine, Ser: 0.79 mg/dL (ref 0.40–1.50)
GFR: 80.39 mL/min (ref >60.00)
Glucose, Bld: 117 mg/dL — ABNORMAL HIGH (ref 70–99)
Potassium: 4.3 meq/L (ref 3.5–5.1)
Sodium: 138 meq/L (ref 135–145)

## 2023-04-18 LAB — VITAMIN B12: Vitamin B-12: 292 pg/mL (ref 211–911)

## 2023-04-18 LAB — FERRITIN: Ferritin: 64.5 ng/mL (ref 22.0–322.0)

## 2023-04-18 MED ORDER — ROPINIROLE HCL 0.25 MG PO TABS: 0.2500 mg | ORAL_TABLET | Freq: Every day | ORAL | Status: DC | PRN

## 2023-04-18 NOTE — Progress Notes (Signed)
Hypertension:    Using medication without problems or lightheadedness: he can get lightheaded on standing.  No syncope.  He had cardiology eval re: pacer.   Chest pain with exertion: no Edema:no Short of breath:no Average home BPs: elevated but had improved some since pacer placement.    He is off hydrochlorothiazide in the meantime.  Has been off for about 1 week.  Started on verapamil in the meantime.    Recently L thenar area was puffy and tender.  That is getting better in the meantime.  No h/o gout.  No symptoms currently.  He retried requip in the meantime and it helped with RLS.  Used as needed.  No ADE on med.    Meds, vitals, and allergies reviewed.   ROS: Per HPI unless specifically indicated in ROS section   Nad Ncat Neck supple, no LA Rrr ctab L chest wall pacer site healing w/o erythema or discharge. Abd soft, not ttp Skin well-perfused. Bilateral thenar areas without swelling erythema or tenderness.

## 2023-04-18 NOTE — Patient Instructions (Signed)
Go to the lab on the way out.   If you have mychart we'll likely use that to update you.    Take care.  Glad to see you. 

## 2023-04-18 NOTE — Telephone Encounter (Addendum)
Patient came in for follow up on hematoma and full post-op device check on 04/17/23.   We discussed his ongoing symptoms including "leg issues" weakness and dizziness.  BP overall has improved from last weeks readings with adding the verapamil, but still elevated.   Device check normal today, no episodes, no correlation with symptoms. Wound is showing improvement, he is back on Eliquis.   Given patient's extensive medical history and ongoing symptoms (not related to device), I have reached out to Dr. Lianne Bushy office (PCP) and they are planning on seeing patient today, 04/18/23 to follow up with ongoing symptoms, including BP meds/control, and blood work.

## 2023-04-20 ENCOUNTER — Ambulatory Visit: Payer: Medicare Other

## 2023-04-21 ENCOUNTER — Encounter: Payer: Self-pay | Admitting: Family Medicine

## 2023-04-21 NOTE — Assessment & Plan Note (Signed)
I think it makes sense to check his labs and not change his medications at this point.  It may be that he has had significantly higher blood pressure readings after his pacer placement and he is still adjusting to his relatively recent change in medications with the addition of verapamil and cessation of hydrochlorothiazide.  He may have elevated blood pressures at this point but still have relative hypotension compared to his previous elevated blood pressures which were much higher.  If his labs are unremarkable then I suspect he may need extra time to adjust to his current medications.  Discussed.  31 minutes were devoted to patient care in this encounter (this includes time spent reviewing the patient's file/history, interviewing and examining the patient, counseling/reviewing plan with patient).    Need to check labs and then see about addressing BP.

## 2023-04-25 ENCOUNTER — Encounter: Payer: Self-pay | Admitting: Family Medicine

## 2023-04-25 DIAGNOSIS — I10 Essential (primary) hypertension: Secondary | ICD-10-CM

## 2023-04-27 ENCOUNTER — Telehealth: Payer: Self-pay | Admitting: *Deleted

## 2023-04-27 NOTE — Telephone Encounter (Signed)
 Will address via mychart.  Thanks.

## 2023-04-27 NOTE — Telephone Encounter (Signed)
 Copied from CRM [id number]. Topic: General - Other >> Apr 27, 2023 10:53 AM Susanna ORN wrote: Reason for CRM: Patient called stating that Dr. Cleatus asked him to report his bp readings. Pt states he messaged Dr. Cleatus with the information and he replied back asking him, how was he feeling? Pt states he does not understand the reply being that he has already told him. He stated he would send another message to him. Please have Dr. Cleatus to either reply to pt's message via MyChart or give him a call back to further discuss/advise pt. CB #: [id number].

## 2023-04-28 ENCOUNTER — Other Ambulatory Visit: Payer: Self-pay | Admitting: Family Medicine

## 2023-04-28 MED ORDER — LOSARTAN POTASSIUM 25 MG PO TABS: 25.0000 mg | ORAL_TABLET | Freq: Every day | ORAL | 1 refills | Status: DC

## 2023-05-01 ENCOUNTER — Telehealth: Payer: Self-pay

## 2023-05-01 NOTE — Telephone Encounter (Signed)
 Would continue verapamil.

## 2023-05-01 NOTE — Telephone Encounter (Signed)
 Spoke with patient and advised of Dr. Armanda Heritage message

## 2023-05-01 NOTE — Telephone Encounter (Signed)
 Copied from CRM [id number]. Topic: Clinical - Medication Question >> May 01, 2023 11:50 AM Truddie Crumble wrote: Reason for CRM: Pt called stating he was prescribed losartan and want to know if need to stop the verapamil

## 2023-05-07 ENCOUNTER — Encounter: Payer: Self-pay | Admitting: Internal Medicine

## 2023-05-08 ENCOUNTER — Ambulatory Visit: Payer: Medicare Other | Admitting: Internal Medicine

## 2023-05-09 ENCOUNTER — Encounter: Payer: Self-pay | Admitting: Internal Medicine

## 2023-05-09 ENCOUNTER — Encounter: Payer: Self-pay | Admitting: Family Medicine

## 2023-05-09 NOTE — Telephone Encounter (Signed)
 Attempted phone call to pt and left voicemail message to call device clinic at [phone] for direct assistance with sending a PPM transmission.

## 2023-05-09 NOTE — Telephone Encounter (Signed)
 The pt states he been having lots of dizziness. He would like to speak with the nurse. Transmission received 05/09/2023.

## 2023-05-09 NOTE — Telephone Encounter (Signed)
 Spoke with pt who complains of continued dizziness.  BP 161/89, HR - 90.  Pt denies additional symptoms at this time. Pt states he was started on Losartan  25mg  - 1 tablet by mouth daily by his PCP 1 week ago and is due for labs per PCP.  Pt also reports he has an upcoming appointment with neurology.  Pt advised to contact his PCP for further evaluation and that from a cardiac/PPM standpoint his device shows normal function with no episodes.  Pt verbalizes understanding and thanked [employer] for the callback.  He will follow up with his PCP.

## 2023-05-09 NOTE — Telephone Encounter (Signed)
 Pt states his dizziness has increased as before. Asked if he had tried vestibular rehab as recommended in October, pt stated he hadn't and only went 1 or 2 times but will try again

## 2023-05-09 NOTE — Telephone Encounter (Signed)
 Remote transmission received and reviewed. Normal device function. Presenting rhythm VP 66 bpm. No episodes noted.   Pt advised I will forward back to triage since device is working normally. Someone will follow up. Pt voiced understanding and appreciative of call.

## 2023-05-11 ENCOUNTER — Telehealth: Payer: Self-pay | Admitting: Family Medicine

## 2023-05-11 ENCOUNTER — Encounter: Payer: Self-pay | Admitting: Family Medicine

## 2023-05-11 NOTE — Telephone Encounter (Signed)
This patient's dizziness/unsteadiness is getting worse.  Is there any way to get his appointment at your clinic moved sooner?  I greatly appreciate your help.

## 2023-05-15 NOTE — Telephone Encounter (Signed)
Dr. Para March we get patient in sooner and repeat MRi brain maybe he has had additional infarcts. Dr. Pearlean Brownie saw him for a second opinion as you know 12/2022 and recommended continuation of eliquis and thought the dizziness may be due to strokes.If this is the causeof his dizziness, As far as his dizziness, its very difficult to treat except with physical therapy and I did recommend vestibular therapy. I''ll get him in sooner, it may with an NP but we will see him and probably repeat MRI brain and see if we can find any other causes and if we have anything to offer this unfortunate gentleman, dizziness is so awful ! Thank you Dr. Para March !! Dr. Artemio Aly)  Jillian/Angel can we see if we have anything within 2-4 weeks please? Me or NP? I'll come speak with you.

## 2023-05-15 NOTE — Telephone Encounter (Signed)
Both the patient and I very much appreciate your help.

## 2023-05-18 NOTE — Telephone Encounter (Signed)
Yes that will work thank you

## 2023-05-18 NOTE — Telephone Encounter (Signed)
Spoke with [name] [name], [name] [name] states he would really prefer to see Dr. Lucia Gaskins instead of an NP. I informed him that we will keep his 06/20/23 appointment with Dr. Lucia Gaskins on the wait list and we will notify him of a sooner appointment.

## 2023-05-24 NOTE — Telephone Encounter (Signed)
Noted

## 2023-06-13 DIAGNOSIS — H532 Diplopia: Secondary | ICD-10-CM | POA: Diagnosis not present

## 2023-06-13 DIAGNOSIS — H34212 Partial retinal artery occlusion, left eye: Secondary | ICD-10-CM | POA: Diagnosis not present

## 2023-06-13 DIAGNOSIS — H43813 Vitreous degeneration, bilateral: Secondary | ICD-10-CM | POA: Diagnosis not present

## 2023-06-13 DIAGNOSIS — H401112 Primary open-angle glaucoma, right eye, moderate stage: Secondary | ICD-10-CM | POA: Diagnosis not present

## 2023-06-13 DIAGNOSIS — H40052 Ocular hypertension, left eye: Secondary | ICD-10-CM | POA: Diagnosis not present

## 2023-06-13 DIAGNOSIS — Z961 Presence of intraocular lens: Secondary | ICD-10-CM | POA: Diagnosis not present

## 2023-06-13 DIAGNOSIS — H35373 Puckering of macula, bilateral: Secondary | ICD-10-CM | POA: Diagnosis not present

## 2023-06-13 DIAGNOSIS — H3562 Retinal hemorrhage, left eye: Secondary | ICD-10-CM | POA: Diagnosis not present

## 2023-06-13 DIAGNOSIS — H0102A Squamous blepharitis right eye, upper and lower eyelids: Secondary | ICD-10-CM | POA: Diagnosis not present

## 2023-06-13 DIAGNOSIS — H0102B Squamous blepharitis left eye, upper and lower eyelids: Secondary | ICD-10-CM | POA: Diagnosis not present

## 2023-06-16 ENCOUNTER — Encounter: Payer: Self-pay | Admitting: Internal Medicine

## 2023-06-19 DIAGNOSIS — M1711 Unilateral primary osteoarthritis, right knee: Secondary | ICD-10-CM | POA: Diagnosis not present

## 2023-06-20 ENCOUNTER — Encounter: Payer: Self-pay | Admitting: Neurology

## 2023-06-20 ENCOUNTER — Ambulatory Visit (INDEPENDENT_AMBULATORY_CARE_PROVIDER_SITE_OTHER): Payer: Medicare Other | Admitting: Neurology

## 2023-06-20 VITALS — Ht 70.0 in | Wt 189.0 lb

## 2023-06-20 DIAGNOSIS — Z8673 Personal history of transient ischemic attack (TIA), and cerebral infarction without residual deficits: Secondary | ICD-10-CM | POA: Diagnosis not present

## 2023-06-20 DIAGNOSIS — R2689 Other abnormalities of gait and mobility: Secondary | ICD-10-CM | POA: Diagnosis not present

## 2023-06-20 DIAGNOSIS — R296 Repeated falls: Secondary | ICD-10-CM | POA: Diagnosis not present

## 2023-06-20 DIAGNOSIS — R269 Unspecified abnormalities of gait and mobility: Secondary | ICD-10-CM | POA: Diagnosis not present

## 2023-06-20 DIAGNOSIS — R42 Dizziness and giddiness: Secondary | ICD-10-CM

## 2023-06-20 DIAGNOSIS — I679 Cerebrovascular disease, unspecified: Secondary | ICD-10-CM

## 2023-06-20 DIAGNOSIS — R270 Ataxia, unspecified: Secondary | ICD-10-CM

## 2023-06-20 NOTE — Patient Instructions (Addendum)
 MRI of the brain w/wo contrast MRI cervical spine Discussed assisted living, palliative hospice, getting more help as a caretaker, better nutrition Vestibular therapy and evaluation for dizziness due to orthostasis and fitting for zippered compression stockings Physical Therapy here    Non-Drug Treatment for Low Blood Pressure on Standing:  1. Changing Postures:  Change posture slowly when getting up, especially in the morning  Hold on to something during the first few minutes after standing up. Do not start walking as soon as you get up from the chair  Avoid prolonged recumbency or lying down  Raise the head of the bed by 10 to 20 degrees  2. Exercise:  Perform Isotonic exercise, e.g.recumbent bike, pedaling movements while sitting in a chair  Avoid exercises where you have to strain   3. Avoid Pooling of blood in legs:  Wear custom-fitted elastic stockings. The ones which extend to the abdomen work even better. Consider wearing an abdominal binder.  Perform physical counter-maneuvers, such as crossing legs and tensing leg muscles.  4. Eating and Drinking:  Small meals are recommended. Avoid large meals. Avoid standing suddenly after a large meal  Avoid alcohol  Increase intake of fluids and regular salt. A daily intake of up to 10 grams of sodium per day and a fluid intake of 2.0 to 2.5 liters per day (8 to 10 glasses of water) is recommended.   Rapid (over 3 minutes) ingestion of approximately 0.5 liter (2 glasses of water) of tap water, raises blood pressure within 5 to 15 minutes and lasts for an hour.  5. Other Tips:  Avoid hot baths. Instead take warm baths  Maintain a BP record standing and lying down  If you are only any BP lowering drugs (antihypertensives, diuretics, antidepressants, drugs for prostate, etc) ask your doctor to revisit the need to keep you on these drugs  If all non-drug therapy fails, ask your doctor about drug therapy   Follow-up with primary care  physician.

## 2023-06-20 NOTE — Progress Notes (Addendum)
 [facility]    Provider:  Dr Lucia Gaskins Requesting Provider: Dr. Para March Primary Care Provider:  Joaquim Nam, MD  CC:  dizziness  06/20/2023: Dizziness happens ny time but cn happen when he stnding. He hs been sitting and felt him going around and around. 1x a week or every 2wweks. During the day. Granddaughter states certain triggers are standing, sitting up, turn and accelerate. More lightheaded, like going to pass oiut. Vision gets blurry, he has seen a retina doctor and his vision is good when they checked his vision. But the spells are "lightheaded, blurry vision, like he is going to pass out" . He has fallen. He is in his recliner, tried to get up and grab a hold of his walker and he can try and get up and feels weak since te surgery and had a cortizone shot in her knees. Usues his right leg when helpin ghis wife, he has to get her in her bed.   Patient complains of symptoms per HPI as well as the following symptoms: caregiver stress . Pertinent negatives and positives per HPI. All others negative  12/2022: He was seen by Dr. Pearlean Brownie who is our vascular neurologist due to MRI which showed lacunar strokes, he was told to continue Eliquis daily for secondary stroke prevention given his history of longstanding A-fib but Dr. Pearlean Brownie did not feel addition of aspirin was indicated due to increased bleeding risk without clearly documented benefit.  Addendum 12/21/2022: patient's symptoms are worsening. There was a question of CNS lymphoma In past MRIs. Need to surveille the brain. Will order repeat.  03/31/2022: He say an orthopaedist and was recommended knee replacement which can be causing the radiating pain in the legs. Now worsening low back pain (points to the SI joint pain), no radiculopathy, no new vision symptoms or other new symptoms related to the past MRI brain nothing else new. He has also had gerd for years, or at least he has been told gerd, been to pulmonary, ENT, PCP, he  feel phlegm in the throat, he can cough it up pretty clear, may be sinuses, no burning in the sternum. He has discomfort in the throat. Never had an upper GI scope especially if it is gerd may have barrett's esophagus? Out of my area, see assessment/plan for specifics.  Patient complains of symptoms per HPI as well as the following symptoms: leg and back pain, coughing . Pertinent negatives and positives per HPI. All others negative   12/24/2021: 89 y.o. 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  He is a referral from Dr. Dione Booze.  Dr. Dione Booze seeing him for skew deviation.  There was an occipital stroke noted.  However also in the MRI showed yet impression read that impression right increased signal intensity on the diffusion weighted imaging in the posterior horn of the right lateral ventricle, some of these areas demonstrate peripheral curvilinear enhancement, this is nonspecific and differential diagnosis includes primary CNS lymphoma versus secondary lymphoma versus metastatic disease versus postinfectious process versus less likely inflammatory process such as sarcoid, demyelinating disease or sarcoidosis.  Extensive workup (see below) was negative and repeat MRI of the brain did not show the concerning findings anymore. I discussed given repeat MRI without findings, patient improvement, and extensive workup including panscan, LP, blood work showing no abnormalities, we can just monitor. I review prior MRI images and new MRI images with patient.   He wants to discuss new problem: A few months  ago, he has had aching legs and thighs, not ocnstant but off and on during the night and down into his calfs. He may have some ankle swelling. Ankle pain ongoing for 20+ years, he feels some weakness in the legs and progressive low back pain, has had multiple surgeries with Dr. Danielle Dess. If he sits a while and he gets up it is difficult to get up and it hurts in the knees and lower back. He  needs knee replacements, symptoms are worse in the right than the left leg. Almost every night they ache, a knee brace helps with the aching and so do compression socks they come above the knees. He has swelling, feels tight like a tourniquet, has had issues for 20+ years, right > left leg discomfort, arms are fine, if he gets on the floor he has to roll over and get up, sometimes hurts to get up(points to the knees and above the knees), more difficult to walk uphill and stairs. Aspirin helps, tylenol does not.   Patient complains of symptoms per HPI as well as the following symptoms: leg pain . Pertinent negatives and positives per HPI. All others negative   August 16, 2021: I am speaking with patient and his wife over the phone to follow-up on finding.  Patient was seen by Dr. Dione Booze for diplopia, MRI of the brain showed an occipital stroke but also right increased signal intensity in the diffusion weighted imaging in the posterior horn of the right lateral ventricle some of these demonstrating peripheral curvilinear enhancement, nonspecific and differential diagnosis includes primary CNS lymphoma versus secondary lymphoma versus metastatic disease versus a postinfectious process or inflammatory process, demyelinating disease or sarcoidosis.  We reviewed his work-up today which unremarkable:  CT of the abdomen and pelvis showed No evidence for a neoplastic or lymphoproliferative process in the chest, abdomen or pelvis LP showed normal glucose, protein, cell count, flow cytometry did not find enough cells to examine, protein, cell differential, HSV, sarcoid, fungal culture, CMV, EBV, VDRL, oligoclonal bands in CSF IgG index were all unremarkable Labs were all unremarkable including IgG and IgM CMV antibodies, sed rate, ANA with reflex, ACE, CRP, SPEP/IFE, ANCA all unremarkable  At this time I recommended we repeat MRI of the brain in 2 to 3 months.  Patient is stable.  No worsening.  If he worsens we will  repeat the MRI sooner, he is to reach out to [place].  HPI:  [name] [name] is a 89 y.o. male here as requested by Quintella Reichert, MD for possible CNS lymphoma.  He is a referral from Dr. Dione Booze.  Dr. Dione Booze seeing him for skew deviation.  There was an occipital stroke noted.  However also in the MRI showed yet impression read that impression right increased signal intensity on the diffusion weighted imaging in the posterior horn of the right lateral ventricle, some of these areas demonstrate peripheral curvilinear enhancement, this is nonspecific and differential diagnosis includes primary CNS lymphoma versus secondary lymphoma versus metastatic disease versus postinfectious process versus less likely inflammatory process such as sarcoid, demyelinating disease or sarcoidosis.  Recommend lumbar puncture.  I spent a long time discussing this with patient and wife, wife appears to have Parkinson's disease and possibly Parkinson's disease dementia I did not get the feeling as she understood, I tried to explain to them what we do not know what this is a we need more testing, they could not understand why I did not know what it was, I  tried to go through the entire differential with them.  At this time organ to do an extensive work-up.  Reviewed notes, labs and imaging from outside physicians, which showed: see above  Review of Systems: Patient complains of symptoms per HPI as well as the following symptoms diplopia. Pertinent negatives and positives per HPI. All others negative.   Social History   Socioeconomic History   Marital status: Married    Spouse name: [name]   Number of children: 3   Years of education: Not on file   Highest education level: Not on file  Occupational History   Occupation: Retired  Tobacco Use   Smoking status: Former    Current packs/day: 0.00    Average packs/day: 1 pack/day for 25.0 years (25.0 ttl pk-yrs)    Types: Cigarettes    Start date: 04/26/1955    Quit date:  04/25/1980    Years since quitting: 43.1   Smokeless tobacco: Never  Vaping Use   Vaping status: Never Used  Substance and Sexual Activity   Alcohol use: No    Alcohol/week: 0.0 standard drinks of alcohol   Drug use: No   Sexual activity: Not on file  Other Topics Concern   Not on file  Social History Narrative   [employer] '56-'58, overseas to [place]   Some care through [place]   No service disability   Social Drivers of Corporate investment banker Strain: Low Risk  (02/13/2023)   Overall Financial Resource Strain (CARDIA)    Difficulty of Paying Living Expenses: Not hard at all  Food Insecurity: No Food Insecurity (02/13/2023)   Hunger Vital Sign    Worried About Running Out of Food in the Last Year: Never true    Ran Out of Food in the Last Year: Never true  Transportation Needs: No Transportation Needs (02/13/2023)   PRAPARE - Administrator, Civil Service (Medical): No    Lack of Transportation (Non-Medical): No  Physical Activity: Insufficiently Active (02/13/2023)   Exercise Vital Sign    Days of Exercise per Week: 2 days    Minutes of Exercise per Session: 40 min  Stress: No Stress Concern Present (02/13/2023)   [employer] of Occupational Health - Occupational Stress Questionnaire    Feeling of Stress : Not at all  Social Connections: Moderately Integrated (02/13/2023)   Social Connection and Isolation Panel [NHANES]    Frequency of Communication with Friends and Family: Three times a week    Frequency of Social Gatherings with Friends and Family: Once a week    Attends Religious Services: 1 to 4 times per year    Active Member of [employer] or Organizations: No    Attends Banker Meetings: Never    Marital Status: Married  Catering manager Violence: Not At Risk (02/13/2023)   Humiliation, Afraid, Rape, and Kick questionnaire    Fear of Current or Ex-Partner: No    Emotionally Abused: No    Physically Abused: No    Sexually Abused: No     Family History  Problem Relation Age of Onset   Cancer Mother    Stroke Neg Hx     Past Medical History:  Diagnosis Date   Arthritis    Atrial fibrillation, permanent (HCC)    Bladder neoplasm    Chronic cough    NO CARDIAC OR PULMONARY RELATED   Coronary artery disease CARDIOLOGIST-  DR KLEIN/ [facility]   a. s/p CABG 2004, b. LHC with patent grafts 07/2005, ejection  fraction 50%;  c. 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 Excell Seltzer & Leafy Ro.   Severe aortic stenosis     Patient Active Problem List   Diagnosis Date Noted   History of CVA in adulthood 01/23/2023   Pain of lower extremity 01/23/2023   RBBB 11/15/2022   S/P TAVR (transcatheter aortic valve replacement) 11/15/2022   DNR (do not resuscitate) 08/08/2022   Permanent atrial fibrillation (HCC) 02/22/2022   Chronic pain of both knees 12/25/2021   Bilateral leg pain 09/28/2021   Advance care planning 10/28/2020   Bilateral pseudophakia 07/27/2020   Carpal tunnel syndrome 07/27/2020   Dermatochalasis 07/27/2020   Hematuria 07/27/2020   Insomnia 07/27/2020   Radiculopathy 07/27/2020   Stenosis of internal carotid artery with cerebral infarction, right (HCC) 07/08/2020   Imbalance 03/27/2020   Retinal artery occlusion 03/10/2020   Constipation 12/22/2019   Rhinitis sicca 05/22/2018   Sciatica 12/28/2017   Neuropathy 10/07/2017   Foot swelling 10/07/2017   CAD (coronary artery disease), autologous vein bypass  graft 10/31/2016   Family history of colon cancer 10/31/2016   History of adenomatous polyp of colon 10/31/2016   Osteoarthritis 10/31/2016   Skin lesion 06/04/2015   Elevated TSH 12/26/2014   Severe aortic stenosis 05/16/2013   Bladder neoplasm 11/23/2012   BCC (basal cell carcinoma of skin) 08/29/2012   GERD (gastroesophageal reflux disease) 05/01/2012   Hyperlipidemia 03/14/2012   TMJ pain dysfunction syndrome 01/20/2012   Long term (current) use of anticoagulants 08/31/2011   RVOT ventricular tachycardia (HCC)    Cough 04/29/2009   HEARING LOSS, SUDDEN 12/03/2007   HTN (hypertension) 02/22/2007   S/P CABG x 5 2004    Past Surgical History:  Procedure Laterality Date   CARDIAC CATHETERIZATION  07-26-2005  DR GAMBLE   PRESERVED LVF/  EF 50%/ PATENT GRAFTS   CARDIAC CATHETERIZATION  03-04-2003  DR GAMBLE   SEVERE 3 VESSEL DISEASE   CARDIAC CATHETERIZATION  1991  DR [facility]   PAF/ FALSE POSITIVE STRESS TEST   CAROTID ENDARTERECTOMY Left 2022   CATARACT EXTRACTION W/ INTRAOCULAR LENS IMPLANT Right    CORONARY ARTERY BYPASS GRAFT  03-08-2003  DR [id number]   LIMA TO LAD/ SVG TO OM2/  SVG TO DIAGONAL / SVG TO PDA & PLA   CYSTOSCOPY WITH BIOPSY N/A 12/25/2012   Procedure: CYSTOSCOPY WITH BLADDER BIOPSY;  Surgeon: Antony Haste, MD;  Location: [facility];  Service: Urology;  Laterality: N/A;   ELECTROPHYSIOLOGY STUDY  07-27-2005  DR Sharlot Gowda TAYLOR   MAPPING --   RESULT NONINDUCIBLE VT OR SVT/  DX RIGHT VENTRICULAR OUTFLOW TRACT VT AND RULES OUT MORE MALIGNANT CAUSES OF VT   ENDARTERECTOMY Left 07/08/2020   Procedure: LEFT CAROTID ENDARTERECTOMY;  Surgeon: Nada Libman, MD;  Location: [facility] OR;  Service: Vascular;  Laterality: Left;   INGUINAL HERNIA REPAIR Bilateral 1954  &  1990   INTRAOPERATIVE  TRANSTHORACIC ECHOCARDIOGRAM N/A 11/15/2022   Procedure: INTRAOPERATIVE TRANSTHORACIC ECHOCARDIOGRAM;  Surgeon: Tonny Bollman, MD;  Location: [facility] INVASIVE CV LAB;   Service: Open Heart Surgery;  Laterality: N/A;   KNEE ARTHROSCOPY Right 1994   LEFT HEART CATH AND CORS/GRAFTS ANGIOGRAPHY N/A 10/14/2022   Procedure: LEFT HEART CATH AND CORS/GRAFTS ANGIOGRAPHY;  Surgeon: Tonny Bollman, MD;  Location: [facility] INVASIVE CV LAB;  Service: Cardiovascular;  Laterality: N/A;   LUMBAR DISC SURGERY  1999   L4 -- L5   MOHS SURGERY     by Dr. Jeanella Craze 2019   PACEMAKER IMPLANT N/A 04/03/2023   Procedure: PACEMAKER IMPLANT;  Surgeon: Duke Salvia, MD;  Location[facility]VASIVE CV LAB;  Service: Cardiovascular;  Laterality: N/A;   REMOVAL VOCAL CORD POLYPS  1978   TONSILLECTOMY  AS CHILD   TRANSCATHETER AORTIC VALVE REPLACEMENT, TRANSFEMORAL N/A 11/15/2022   Procedure: Transcatheter Aortic Valve Replacement, Transfemoral;  Surgeon: Tonny Bollman, MD;  Location: [facility] INVASIVE CV LAB;  Service: Open Heart Surgery;  Laterality: N/A;   TRANSTHORACIC ECHOCARDIOGRAM  08/08/2011   MILD LVH/  EF 55-60%/  GRADE I DIASTOLIC DYSFUNCTION/ MILD AV STENOSIS    Current Outpatient Medications  Medication Sig Dispense Refill   acetaminophen (TYLENOL) 500 MG tablet Take 500-1,000 mg by mouth every 6 (six) hours as needed (pain.).     amoxicillin (AMOXIL) 500 MG tablet Take 4 tablets (2,000 mg total) by mouth as directed. 1 hour prior to dental work including cleanings 12 tablet 12   apixaban (ELIQUIS) 5 MG TABS tablet Take 1 tablet (5 mg total) by mouth 2 (two) times daily. Take evening dose starting 07/09/20 180 tablet 3   butalbital-acetaminophen-caffeine (FIORICET, ESGIC) 50-325-40 MG per tablet Take 1 tablet by mouth 3 (three) times daily as needed for migraine.      cetirizine (ZYRTEC) 10 MG tablet Take 1 tablet (10 mg total) by mouth daily.     Iron, Ferrous Sulfate, 325 (65 Fe) MG TABS Take 325 mg by mouth daily.     latanoprost (XALATAN) 0.005 % ophthalmic solution Place 1 drop into both eyes at bedtime.     losartan (COZAAR) 25 MG tablet Take 1 tablet (25 mg total) by mouth daily. 90  tablet 1   melatonin 3 MG TABS tablet Take 3 mg by mouth at bedtime.     Polyethyl Glycol-Propyl Glycol (SYSTANE ULTRA OP) Place 1-2 drops into both eyes once as needed (dry eyes.).     potassium chloride (KLOR-CON M) 10 MEQ tablet Take 10 mEq by mouth in the morning.     pravastatin (PRAVACHOL) 80 MG tablet Take 1 tablet (80 mg total) by mouth every evening. 90 tablet 0   rOPINIRole (REQUIP) 0.25 MG tablet Take 1 tablet (0.25 mg total) by mouth daily as needed (for RLS).     amoxicillin-clavulanate (AUGMENTIN) 875-125 MG tablet Take 1 tablet by mouth 2 (two) times daily. 20 tablet 0   Blood Pressure Monitoring (B-D ASSURE BPM/AUTO ARM CUFF) MISC Use check to BP.  Omron cuff if possible.  Dx. HTN 1 each 0   No current facility-administered medications for this visit.    Allergies as of 06/20/2023 - Review Complete 06/20/2023  Allergen Reaction Noted   Hydralazine hcl Other (See Comments) 03/22/2023   Ace inhibitors Other (See Comments) 12/17/2012   Zocor [simvastatin] Other (See Comments) 12/17/2012   Nsaids Other (See Comments) 09/17/2020   Prednisone Other (See Comments) 10/29/2020   Cephalexin Rash    Chocolate Other (See Comments) 03/10/2020  Doxycycline Other (See Comments) 04/30/2018   Gabapentin Other (See Comments) 12/17/2019   Hycodan [hydrocodone bit-homatrop mbr] Nausea And Vomiting 08/18/2011   Oxycodone Nausea Only 09/01/2015    Vitals: Ht 5\' 10"  (1.778 m)   Wt 189 lb (85.7 kg)   BMI 27.12 kg/m  Last Weight:  Wt Readings from Last 1 Encounters:  06/23/23 188 lb (85.3 kg)   Last Height:   Ht Readings from Last 1 Encounters:  06/23/23 5\' 10"  (1.778 m)   134/69 p 74  Exam: NAD, pleasant                  Speech:    Speech is normal; fluent and spontaneous with normal comprehension.  Cognition:    The patient is oriented to person, place, and time;     recent and remote memory intact;     language fluent;    Cranial Nerves:    The pupils are equal, round,  and reactive to light.Trigeminal sensation is intact and the muscles of mastication are normal. The face is symmetric. The palate elevates in the midline. Hearing intact. Voice is normal. Shoulder shrug is normal. The tongue has normal motion without fasciculations.   Coordination:  No dysmetria, normal FTN  Motor Observation:    No asymmetry, no atrophy, and no involuntary movements noted. Tone:    Normal muscle tone.     Strength:    Strength is V/V in the upper and lower limbs.      Sensation: intact to LT  Gait: Can arise from a chair without difficulty, stance is normal, possibly a little wide-based when walking and slightly imbalance using a walking aid.    Assessment/Plan: 89 y.o. male here as requested by Joaquim Nam, MD for continued dizziness  We ave seen him before for strokes, leg pain, low back pain, SI joint pain, dizziness,   Extensive workup included panscan, LP, blood work showing no abnormalities, repeat MRIs, had consult with vascular neurologist Dr. Pearlean Brownie as well. Neuro exam stable mild imbalance on walking  Today he states he continues to reportedly be dizzy, ataxic, falls. I think his dizziness is due to his extremely labile blood pressure. He shows me readings where his blood pressure changes 50+ or more systolic within days or within the same day. We will reimage him to ensure no other etiolgies such as stroke, cervical myelopathy but feel his dizziness is labile BP and discussed evaluation, vestibular rehab and non-drug treatments as below:  - MRI of the brain w/wo contrast - MRI cervical spine - Discussed assisted living, palliative hospice, getting more help as a caretaker(stressful caring for his wife), better nutrition - Vestibular therapy and evaluation for dizziness due to orthostasis and fitting for zippered compression stockings at PT  Orders Placed This Encounter  Procedures   MR BRAIN W WO CONTRAST   MR CERVICAL SPINE WO CONTRAST   Ambulatory  referral to Physical Therapy   Non-Drug Treatment for Low Blood Pressure on Standing:  1. Changing Postures:  Change posture slowly when getting up, especially in the morning  Hold on to something during the first few minutes after standing up. Do not start walking as soon as you get up from the chair  Avoid prolonged recumbency or lying down  Raise the head of the bed by 10 to 20 degrees  2. Exercise:  Perform Isotonic exercise, e.g.recumbent bike, pedaling movements while sitting in a chair  Avoid exercises where you have to strain   3.  Avoid Pooling of blood in legs:  Wear custom-fitted elastic stockings. The ones which extend to the abdomen work even better. Consider wearing an abdominal binder.  Perform physical counter-maneuvers, such as crossing legs and tensing leg muscles.  4. Eating and Drinking:  Small meals are recommended. Avoid large meals. Avoid standing suddenly after a large meal  Avoid alcohol  Increase intake of fluids and regular salt. A daily intake of up to 10 grams of sodium per day and a fluid intake of 2.0 to 2.5 liters per day (8 to 10 glasses of water) is recommended.   Rapid (over 3 minutes) ingestion of approximately 0.5 liter (2 glasses of water) of tap water, raises blood pressure within 5 to 15 minutes and lasts for an hour.  5. Other Tips:  Avoid hot baths. Instead take warm baths  Maintain a BP record standing and lying down  If you are only any BP lowering drugs (antihypertensives, diuretics, antidepressants, drugs for prostate, etc) ask your doctor to revisit the need to keep you on these drugs  If all non-drug therapy fails, ask your doctor about drug therapy    PRIOR EVALUATIONS AND PLANS:  - leg pain in the setting of knee pain and needing total knee replacements per patient: His strength is normal, multiple CKs normal, pain is more distal (he rubs his knees and distal upper leg). Unlikely muscle disease, likely referred pain from the knees.  Will monitor clinically. Also multiple back surgeries. He saw an orthopaedist and was recommended knee replacement. Now worsening back pain (points to the SI joint pain), no radiculopathy. Offered SI joint injections, can go directly to Dr. Geanie Cooley or can see Dr. Danielle Dess first. He would like to see both.  After Visit Summary:   SI joint point tenderness om the right: Dr. Arley Phenix SI joint injections Low back pain, established with Dr. Danielle Dess had 2 surgeries: No radiculopathy, I think his back pain is in the SI joint but patient requests to see Dr. Danielle Dess as well  Discomfort in the throat: been to pulmonary, ENT(years ago dr Pollyann Kennedy and dr Suszanne Conners for vertigo), PCP, he feel phlegm in the base of the throat, he can cough it up pretty clear, no sinus drainage, no burning in the sternum, not worse at night. He has discomfort in the throat. Never had an upper GI scope - maybe he should especially if it is gerd may have barrett's esophagus? Out of my area of expertise, told him this, will refer back to Dr. Suszanne Conners. Worsening. Probably needs a scope at this point for visualization but will leave that up to ENT. Coughing because of it.   SI joint point tenderness om the right: Dr. Arley Phenix SI joint injections Low back pain, established with Dr. Danielle Dess had 2 surgeries: No radiculopathy, I think his back pain is in the SI joint but patient requests to see Dr. Danielle Dess as well Send to ENT Dr. Suszanne Conners   Sacroiliac joint arthritis   Sacroiliac Joint Dysfunction   Orders Placed This Encounter  Procedures   MR BRAIN W WO CONTRAST   MR CERVICAL SPINE WO CONTRAST   Ambulatory referral to Physical Therapy      - Patient was seen by Dr. Dione Booze for diplopia, MRI of the brain showed an occipital stroke but also right increased signal intensity in the diffusion weighted imaging in the posterior horn of the right lateral ventricle some of these demonstrating peripheral curvilinear enhancement, nonspecific and differential  diagnosis includes primary CNS lymphoma versus  secondary lymphoma versus metastatic disease versus a postinfectious process or inflammatory process, demyelinating disease or sarcoidosis.  We reviewed his work-up again today which unremarkable: Extensive workup (see below) was negative and repeat MRI of the brain did not show the concerning findings anymore. I discussed given repeat MRI without findings, patient improvement, and extensive workup including panscan, LP, blood work showing no abnormalities, we can just monitor. I review prior MRI images and new MRI images with patient.   CT of the abdomen and pelvis showed No evidence for a neoplastic or lymphoproliferative process in the chest, abdomen or pelvis LP showed normal glucose, protein, cell count, flow cytometry did not find enough cells to examine, protein, cell differential, HSV, sarcoid, fungal culture, CMV, EBV, VDRL, oligoclonal bands in CSF IgG index were all unremarkable Labs were all unremarkable including IgG and IgM CMV antibodies, sed rate, ANA with reflex, ACE, CRP, SPEP/IFE, ANCA all unremarkable  At this time I recommended we monitor clinically  Cc: Quintella Reichert, MD,  Dr. Danielle Dess, Dr Suszanne Conners  Naomie Dean, MD  [facility] [address] [place], [place] [address]  Phone [phone] Fax [phone] I spent over 40 minutes of face-to-face and non-face-to-face time with patient on the  1. Dizziness   2. Vertigo   3. Cerebrovascular disease   4. Hx of arterial ischemic stroke   5. Gait abnormality   6. Falls   7. Imbalance   8. Ataxia     diagnosis.  This included previsit chart review, lab review, study review, order entry, electronic health record documentation, patient education on the different diagnostic and therapeutic options, counseling and coordination of care, risks and benefits of management, compliance, or risk factor reduction

## 2023-06-23 ENCOUNTER — Ambulatory Visit (INDEPENDENT_AMBULATORY_CARE_PROVIDER_SITE_OTHER): Payer: Medicare Other | Admitting: Family Medicine

## 2023-06-23 VITALS — BP 182/93 | HR 75 | Temp 97.8°F | Ht 70.0 in | Wt 188.0 lb

## 2023-06-23 DIAGNOSIS — I10 Essential (primary) hypertension: Secondary | ICD-10-CM

## 2023-06-23 DIAGNOSIS — J3489 Other specified disorders of nose and nasal sinuses: Secondary | ICD-10-CM

## 2023-06-23 LAB — BASIC METABOLIC PANEL
BUN: 24 mg/dL — ABNORMAL HIGH (ref 6–23)
CO2: 28 meq/L (ref 19–32)
Calcium: 8.8 mg/dL (ref 8.4–10.5)
Chloride: 104 meq/L (ref 96–112)
Creatinine, Ser: 0.79 mg/dL (ref 0.40–1.50)
GFR: 80.29 mL/min (ref >60.00)
Glucose, Bld: 99 mg/dL (ref 70–99)
Potassium: 4.4 meq/L (ref 3.5–5.1)
Sodium: 139 meq/L (ref 135–145)

## 2023-06-23 MED ORDER — BD ASSURE BPM/AUTO ARM CUFF MISC: 0 refills | Status: DC

## 2023-06-23 MED ORDER — AMOXICILLIN-POT CLAVULANATE 875-125 MG PO TABS
1.0000 | ORAL_TABLET | Freq: Two times a day (BID) | ORAL | 0 refills | Status: DC
Start: 2023-06-23 — End: 2023-07-10

## 2023-06-23 NOTE — Patient Instructions (Addendum)
 Go to the lab on the way out.   If you have mychart we'll likely use that to update you.    Take care.  Glad to see you. [employer] makes reliable BP cuffs.    Start augmentin and let me know if the facial pressure and throat clearing are not better.

## 2023-06-23 NOTE — Progress Notes (Signed)
 BP elevated on home check.  He has been off verapamil for about 3 weeks.    His BP cuff 182/93.   Our cuff 154/100.   I suspect his readings are artificially high.  Discussed that we may need to taper losartan.    D/w pt about neuro eval re: dizziness. Plan for MRI per neuro, pending.   URI sx d/w pt.  Cough.  Going on for about 2 months.  Throat clearing.  No fevers.  No facial pain fur facial pressure. Throat irritated. No ear pain.  Can still take a deep breath.    Meds, vitals, and allergies reviewed.   ROS: Per HPI unless specifically indicated in ROS section   Nad Ncat Neck supple, no LA IRR Ctab Abd soft, not ttp  33 minutes were devoted to patient care in this encounter (this includes time spent reviewing the patient's file/history, interviewing and examining the patient, counseling/reviewing plan with patient).   Skin well perfused.  Sinus pressure but not ttp x4.

## 2023-06-25 ENCOUNTER — Encounter: Payer: Self-pay | Admitting: Family Medicine

## 2023-06-25 ENCOUNTER — Encounter: Payer: Self-pay | Admitting: Neurology

## 2023-06-25 ENCOUNTER — Telehealth: Payer: Self-pay | Admitting: Neurology

## 2023-06-25 DIAGNOSIS — J3489 Other specified disorders of nose and nasal sinuses: Secondary | ICD-10-CM | POA: Insufficient documentation

## 2023-06-25 MED ORDER — LOSARTAN POTASSIUM 25 MG PO TABS: 12.5000 mg | ORAL_TABLET | Freq: Every day | ORAL | Status: DC

## 2023-06-25 NOTE — Telephone Encounter (Signed)
 He needs open MRI at [facility] for claustrophobia I forgot to put that in my order sorry, thanks

## 2023-06-25 NOTE — Assessment & Plan Note (Signed)
 Discussed option.  His BP cuff 182/93.   Our cuff 154/100.   I suspect his readings are artificially high.  Discussed that we may need to taper losartan.   D/w pt about neuro eval re: dizziness. Plan for MRI per neuro, pending.  See notes on labs.   Discussed getting a new BP cuff to check BP at home.

## 2023-06-25 NOTE — Addendum Note (Signed)
 Addended by: Naomie Dean B on: 06/25/2023 03:44 PM   Modules accepted: Level of Service

## 2023-06-25 NOTE — Assessment & Plan Note (Signed)
 Start augmentin and he can let me know if the facial pressure and throat clearing are not better.

## 2023-06-26 NOTE — Telephone Encounter (Signed)
 Noted, thank you

## 2023-06-29 ENCOUNTER — Encounter: Payer: Self-pay | Admitting: Internal Medicine

## 2023-06-30 ENCOUNTER — Ambulatory Visit (INDEPENDENT_AMBULATORY_CARE_PROVIDER_SITE_OTHER)

## 2023-06-30 DIAGNOSIS — I442 Atrioventricular block, complete: Secondary | ICD-10-CM

## 2023-06-30 NOTE — Telephone Encounter (Signed)
 Spoke with pt and advised after reviewing his last appointment note with neurology from 06/20/23 his neurologist feels dizziness is coming from orthostatic BP changes.  Reviewed neurology recommendations and advised it will be important for pt to follow through with recommendations to see if these help.  Pt advised to continue orthostatic BP monitoring as recommended as well.  Pt states his wife has many health concerns and they had to spend yesterday in the ED.  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  Pt states he will try to follow recommendations and will plan to follow through with recommended testing and appointments.

## 2023-06-30 NOTE — Telephone Encounter (Signed)
 Pt states his legs also hurts with the dizziness. I help the pt send a manual transmission for the nurse to review. I let him know someone will give him a call back.

## 2023-07-01 ENCOUNTER — Encounter: Payer: Self-pay | Admitting: Family Medicine

## 2023-07-04 ENCOUNTER — Ambulatory Visit

## 2023-07-04 LAB — CUP PACEART REMOTE DEVICE CHECK
Battery Remaining Longevity: 162 mo
Battery Voltage: 3.19 V
Brady Statistic RV Percent Paced: 89.55 %
Date Time Interrogation Session: 20250307092142
Implantable Lead Connection Status: 753985
Implantable Lead Implant Date: 20241209
Implantable Lead Location: 753860
Implantable Lead Model: 5076
Implantable Pulse Generator Implant Date: 20241209
Lead Channel Impedance Value: 380 Ohm
Lead Channel Impedance Value: 513 Ohm
Lead Channel Pacing Threshold Amplitude: 0.5 V
Lead Channel Pacing Threshold Pulse Width: 0.4 ms
Lead Channel Sensing Intrinsic Amplitude: 16.375 mV
Lead Channel Sensing Intrinsic Amplitude: 16.375 mV
Lead Channel Setting Pacing Amplitude: 2 V
Lead Channel Setting Pacing Pulse Width: 0.4 ms
Lead Channel Setting Sensing Sensitivity: 2 mV
Zone Setting Status: 755011

## 2023-07-05 ENCOUNTER — Other Ambulatory Visit: Payer: Self-pay | Admitting: Family Medicine

## 2023-07-05 ENCOUNTER — Encounter: Payer: Self-pay | Admitting: Family Medicine

## 2023-07-05 MED ORDER — LOSARTAN POTASSIUM 25 MG PO TABS: 25.0000 mg | ORAL_TABLET | Freq: Every day | ORAL | Status: DC

## 2023-07-06 ENCOUNTER — Encounter: Payer: Self-pay | Admitting: Internal Medicine

## 2023-07-10 ENCOUNTER — Encounter: Payer: Self-pay | Admitting: Internal Medicine

## 2023-07-10 ENCOUNTER — Ambulatory Visit: Payer: Medicare Other | Attending: Internal Medicine | Admitting: Internal Medicine

## 2023-07-10 VITALS — BP 146/78 | HR 68 | Ht 70.0 in | Wt 182.0 lb

## 2023-07-10 DIAGNOSIS — I4821 Permanent atrial fibrillation: Secondary | ICD-10-CM | POA: Insufficient documentation

## 2023-07-10 DIAGNOSIS — I4510 Unspecified right bundle-branch block: Secondary | ICD-10-CM | POA: Insufficient documentation

## 2023-07-10 DIAGNOSIS — I442 Atrioventricular block, complete: Secondary | ICD-10-CM | POA: Insufficient documentation

## 2023-07-10 NOTE — Progress Notes (Unsigned)
 Patient Care Team: Joaquim Nam, MD as PCP - General (Family Medicine) Duke Salvia, MD as PCP - Cardiology (Cardiology) Kathyrn Sheriff, [facility] (Inactive) as Pharmacist (Pharmacist) [facility]   HPI  [name] [name] is a 89 y.o. male Seen in followup for atrial fibrillation now permanent and VT-RVOT previously treated with amiodarone. Also AoStenosis moderate and seen recently by Dr Gwendalyn Ege;  underwent TAVR 7/24 Ischemic heart disease with prior bypass surgery 2004 Catheterization 2007 demonstrated patent grafts  History of post micturition syncope   He underwent monitoring after initial visit to clarify the presence of atrial fibrillation.This prompted reinitiation of his warfarin and his amiodarone. He had  seen Dr. Johney Frame to consider catheter ablation but decided that he would continue on amiodarone.  Now discontinued  Interval stroke 11/21.  At that point aspirin was added to his Eliquis.  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  Underwent pacing.  Again no relief of symptoms of dizziness   this is mostly when he is vertical.  Orthostatic vital signs have been impressively unremarkable in the past.  There are some associated gait instability.  Worse since his TAVR   DATE TEST EF   2/16 Echo  55-60 % AS mild Gradient 17   11/21 Echo  55-60% AS Mean Grad 24  10/23 Echo  65% AS Mean Grad 36  8/24 Echo 60-65% As Mean Grad 8      Date Cr K TSH LFTs Hgb  1/15    4.17     11/17    3.63 11    2/25 0.79 4.4   10.9                Past Medical History:  Diagnosis Date   Arthritis    Atrial fibrillation, permanent (HCC)    Bladder neoplasm    Chronic cough    NO CARDIAC OR  PULMONARY RELATED   Coronary artery disease CARDIOLOGIST-  DR Jarnell Cordaro/ [facility]   a. s/p CABG 2004, b. LHC with patent grafts 07/2005, ejection fraction 50%;  c. 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 Excell Seltzer & Leafy Ro.   Severe aortic stenosis     Past Surgical History:  Procedure Laterality Date   CARDIAC CATHETERIZATION  07-26-2005  DR GAMBLE   PRESERVED LVF/  EF 50%/ PATENT GRAFTS   CARDIAC CATHETERIZATION  03-04-2003  DR GAMBLE   SEVERE 3 VESSEL DISEASE   CARDIAC CATHETERIZATION  1991  DR [facility]   PAF/ FALSE POSITIVE STRESS TEST   CAROTID ENDARTERECTOMY Left 2022   CATARACT EXTRACTION W/ INTRAOCULAR LENS IMPLANT Right    CORONARY ARTERY BYPASS GRAFT  03-08-2003  DR [id number]   LIMA TO LAD/ SVG TO OM2/  SVG TO DIAGONAL / SVG TO PDA & PLA   CYSTOSCOPY WITH BIOPSY N/A 12/25/2012   Procedure: CYSTOSCOPY WITH BLADDER BIOPSY;  Surgeon: Antony Haste, MD;  Location: [facility];  Service: Urology;  Laterality: N/A;   ELECTROPHYSIOLOGY STUDY  07-27-2005  DR Lewayne Bunting   MAPPING --   RESULT NONINDUCIBLE VT OR SVT/  DX RIGHT VENTRICULAR OUTFLOW TRACT VT AND RULES OUT MORE MALIGNANT CAUSES OF VT   ENDARTERECTOMY Left 07/08/2020   Procedure: LEFT CAROTID ENDARTERECTOMY;  Surgeon: Nada Libman, MD;  Location: [facility] OR;  Service: Vascular;  Laterality: Left;   INGUINAL HERNIA REPAIR Bilateral 1954  &  1990   INTRAOPERATIVE TRANSTHORACIC ECHOCARDIOGRAM N/A 11/15/2022    Procedure: INTRAOPERATIVE TRANSTHORACIC ECHOCARDIOGRAM;  Surgeon: Tonny Bollman, MD;  Location: [facility] INVASIVE CV LAB;  Service: Open Heart Surgery;  Laterality: N/A;   KNEE ARTHROSCOPY Right 1994   LEFT HEART CATH AND CORS/GRAFTS ANGIOGRAPHY N/A 10/14/2022   Procedure: LEFT HEART CATH AND CORS/GRAFTS ANGIOGRAPHY;  Surgeon: Tonny Bollman, MD;  Location: [facility] INVASIVE CV LAB;  Service: Cardiovascular;  Laterality: N/A;   LUMBAR DISC SURGERY  1999   L4 -- L5   MOHS SURGERY     by Dr. Jeanella Craze 2019   PACEMAKER IMPLANT N/A 04/03/2023   Procedure: PACEMAKER IMPLANT;  Surgeon: Duke Salvia, MD;  Location: [facility] INVASIVE CV LAB;  Service: Cardiovascular;  Laterality: N/A;   REMOVAL VOCAL CORD POLYPS  1978   TONSILLECTOMY  AS CHILD   TRANSCATHETER AORTIC VALVE REPLACEMENT, TRANSFEMORAL N/A 11/15/2022   Procedure: Transcatheter Aortic Valve Replacement, Transfemoral;  Surgeon: Tonny Bollman, MD;  Location: [facility] INVASIVE CV LAB;  Service: Open Heart Surgery;  Laterality: N/A;   TRANSTHORACIC ECHOCARDIOGRAM  08/08/2011   MILD LVH/  EF 55-60%/  GRADE I DIASTOLIC DYSFUNCTION/ MILD AV STENOSIS    Current Outpatient Medications  Medication Sig Dispense Refill   acetaminophen (TYLENOL) 500 MG tablet Take 500-1,000 mg by mouth every 6 (six) hours as needed (pain.).     amoxicillin (AMOXIL) 500 MG tablet Take 4 tablets (2,000 mg total) by mouth as directed. 1 hour prior to dental work including cleanings 12 tablet 12   apixaban (ELIQUIS) 5 MG TABS tablet Take 1 tablet (5 mg total) by mouth 2 (two) times daily. Take evening dose starting 07/09/20 180 tablet 3   Blood Pressure Monitoring (B-D ASSURE BPM/AUTO ARM CUFF) MISC Use check to BP.  Omron cuff if possible.  Dx. HTN 1 each 0   butalbital-acetaminophen-caffeine (FIORICET, ESGIC) 50-325-40 MG per tablet Take 1 tablet by mouth 3 (three) times daily as needed for migraine.      cetirizine (ZYRTEC) 10 MG tablet Take 1 tablet (10 mg total) by mouth daily.      Iron, Ferrous Sulfate, 325 (65 Fe) MG TABS Take 325 mg by mouth daily.     latanoprost (XALATAN) 0.005 % ophthalmic solution Place 1 drop into both eyes at bedtime.     losartan (COZAAR) 25 MG tablet Take 1 tablet (25 mg total) by mouth daily.     melatonin 3 MG TABS tablet Take 3 mg by mouth at bedtime.     Polyethyl Glycol-Propyl Glycol (SYSTANE ULTRA OP) Place 1-2 drops into both eyes once as needed (dry eyes.).     potassium chloride (KLOR-CON M) 10 MEQ tablet Take 10 mEq by mouth in the morning.     pravastatin (PRAVACHOL) 80 MG tablet  Take 1 tablet (80 mg total) by mouth every evening. 90 tablet 0   rOPINIRole (REQUIP) 0.25 MG tablet Take 1 tablet (0.25 mg total) by mouth daily as needed (for RLS).     No current facility-administered medications for this visit.    Allergies  Allergen Reactions   Hydralazine Hcl Other (See Comments)    Had severe headache all night.  Nightmares.  Drugged feeling.  Extreme dizziness   Ace Inhibitors Other (See Comments)    COUGH   Zocor [Simvastatin] Other (See Comments)    MYALGIA   Nsaids Other (See Comments)    Would avoid given anticoagulation   Prednisone Other (See Comments)    Prednisone caused pt to have elevated BP.   Cephalexin Rash   Chocolate Other (See Comments)    Headaches    Doxycycline Other (See Comments)    Nausea and abd pain   Gabapentin Other (See Comments)    Dizziness   Hycodan [Hydrocodone Bit-Homatrop Mbr] Nausea And Vomiting   Oxycodone Nausea Only    Pt states that he cannot take prescription pain medication    Review of Systems negative except from HPI and PMH  Physical Exam BP (!) 146/78   Pulse 68   Ht 5\' 10"  (1.778 m)   Wt 182 lb (82.6 kg)   SpO2 96%   BMI 26.11 kg/m  Well developed and well nourished in no acute distress HENT normal Neck supple with JVP-flat Clear Device pocket well healed; without hematoma or erythema.  There is no tethering  Regular rate and rhythm, no *** gallop No ***/***  murmur Abd-soft with active BS No Clubbing cyanosis *** edema Skin-warm and dry A & Oriented  Grossly normal sensory and motor function  ECG atrial fibrillation with ventricular pacing and PVCs in a pattern of trigeminy  Device function isnormal. Programming changes lower rate was increased to 75 with elimination of the PVCs See Paceart for details    Assessment and  Plan  Aortic stenosis  Mod  Atrial fibrillation-Permanent   Hypertension  Ischemic heart disease  S/p CABG  With near normal LV function  Syncope  Orthostatic lightheadedness without documented blood pressure change  PVCs  ***

## 2023-07-10 NOTE — Patient Instructions (Signed)
 Medication Instructions:  Your physician recommends that you continue on your current medications as directed. Please refer to the Current Medication list given to you today.  *If you need a refill on your cardiac medications before your next appointment, please call your pharmacy*   Lab Work: None ordered.  If you have labs (blood work) drawn today and your tests are completely normal, you will receive your results only by: MyChart Message (if you have MyChart) OR A paper copy in the mail If you have any lab test that is abnormal or we need to change your treatment, we will call you to review the results.   Testing/Procedures: None ordered.    Follow-Up: At [facility], you and your health needs are our priority.  As part of our continuing mission to provide you with exceptional heart care, we have created designated Provider Care Teams.  These Care Teams include your primary Cardiologist (physician) and Advanced Practice Providers (APPs -  Physician Assistants and Nurse Practitioners) who all work together to provide you with the care you need, when you need it.  We recommend signing up for the patient portal called "MyChart".  Sign up information is provided on this After Visit Summary.  MyChart is used to connect with patients for Virtual Visits (Telemedicine).  Patients are able to view lab/test results, encounter notes, upcoming appointments, etc.  Non-urgent messages can be sent to your provider as well.   To learn more about what you can do with MyChart, go to [email or web address].    Your next appointment:   9 months

## 2023-07-11 ENCOUNTER — Encounter

## 2023-07-11 ENCOUNTER — Encounter: Payer: Self-pay | Admitting: Internal Medicine

## 2023-07-11 NOTE — Telephone Encounter (Signed)
 Spoke with patient over the phone. Offered him to come today to adjust back LRL from 75 to 60. He is going to try to get transportation.  If unable will my chart back with a time for tomorrow to [facility].

## 2023-07-11 NOTE — Telephone Encounter (Signed)
 FYI

## 2023-07-12 ENCOUNTER — Ambulatory Visit: Attending: Internal Medicine

## 2023-07-12 DIAGNOSIS — R42 Dizziness and giddiness: Secondary | ICD-10-CM

## 2023-07-12 LAB — CUP PACEART INCLINIC DEVICE CHECK
Battery Remaining Longevity: 161 mo
Battery Voltage: 3.19 V
Brady Statistic RV Percent Paced: 89.7 %
Date Time Interrogation Session: 20250319103512
Implantable Lead Connection Status: 753985
Implantable Lead Implant Date: 20241209
Implantable Lead Location: 753860
Implantable Lead Model: 5076
Implantable Pulse Generator Implant Date: 20241209
Lead Channel Impedance Value: 361 Ohm
Lead Channel Impedance Value: 494 Ohm
Lead Channel Pacing Threshold Amplitude: 0.5 V
Lead Channel Pacing Threshold Pulse Width: 0.4 ms
Lead Channel Sensing Intrinsic Amplitude: 14 mV
Lead Channel Sensing Intrinsic Amplitude: 14 mV
Lead Channel Setting Pacing Amplitude: 2 V
Lead Channel Setting Pacing Pulse Width: 0.4 ms
Lead Channel Setting Sensing Sensitivity: 2 mV
Zone Setting Status: 755011

## 2023-07-12 NOTE — Progress Notes (Signed)
 Patient brought back in acutely today after having increased dizziness and falls since LRL change from 60 to 75bpm on 07/10/23 at in office visit with Dr. Graciela Husbands.  LRL increase was made in an attempt to outrun bigeminal PVC's and see if dizziness would improve. There was noted decrease in PVC's on presenting and on overall burden; however, patients symptoms have increased.   PROGRAMMING CHANGES TODAY:  LRL decreased from 75 to 55bpm.  (patient request to lower below 60, Dr. Graciela Husbands in agreement with trial).  After LRL adjustment today Presenting now shows return of bigeminal PVC's (VP/VS 58-71)    Patient knows to call back to office if any further concerns. Will continue to monitor.

## 2023-07-13 ENCOUNTER — Encounter: Payer: Self-pay | Admitting: Internal Medicine

## 2023-07-16 ENCOUNTER — Encounter: Payer: Self-pay | Admitting: Family Medicine

## 2023-07-19 LAB — CUP PACEART INCLINIC DEVICE CHECK
Date Time Interrogation Session: 20250317092203
Implantable Lead Connection Status: 753985
Implantable Lead Implant Date: 20241209
Implantable Lead Location: 753860
Implantable Lead Model: 5076
Implantable Pulse Generator Implant Date: 20241209

## 2023-08-02 ENCOUNTER — Ambulatory Visit (HOSPITAL_COMMUNITY)
Admission: RE | Admit: 2023-08-02 | Discharge: 2023-08-02 | Disposition: A | Discharge status: Home / Self Care | Source: Ambulatory Visit | Attending: Neurology | Admitting: Neurology

## 2023-08-02 DIAGNOSIS — R269 Unspecified abnormalities of gait and mobility: Secondary | ICD-10-CM

## 2023-08-02 DIAGNOSIS — I679 Cerebrovascular disease, unspecified: Secondary | ICD-10-CM | POA: Diagnosis not present

## 2023-08-02 DIAGNOSIS — R42 Dizziness and giddiness: Secondary | ICD-10-CM | POA: Diagnosis not present

## 2023-08-02 DIAGNOSIS — Z8673 Personal history of transient ischemic attack (TIA), and cerebral infarction without residual deficits: Secondary | ICD-10-CM | POA: Insufficient documentation

## 2023-08-02 DIAGNOSIS — R296 Repeated falls: Secondary | ICD-10-CM

## 2023-08-02 DIAGNOSIS — R270 Ataxia, unspecified: Secondary | ICD-10-CM | POA: Insufficient documentation

## 2023-08-02 DIAGNOSIS — R2689 Other abnormalities of gait and mobility: Secondary | ICD-10-CM | POA: Insufficient documentation

## 2023-08-02 DIAGNOSIS — M50221 Other cervical disc displacement at C4-C5 level: Secondary | ICD-10-CM | POA: Diagnosis not present

## 2023-08-02 DIAGNOSIS — M50223 Other cervical disc displacement at C6-C7 level: Secondary | ICD-10-CM | POA: Diagnosis not present

## 2023-08-02 DIAGNOSIS — R29818 Other symptoms and signs involving the nervous system: Secondary | ICD-10-CM | POA: Diagnosis not present

## 2023-08-02 DIAGNOSIS — I6782 Cerebral ischemia: Secondary | ICD-10-CM | POA: Diagnosis not present

## 2023-08-02 DIAGNOSIS — M5023 Other cervical disc displacement, cervicothoracic region: Secondary | ICD-10-CM | POA: Diagnosis not present

## 2023-08-02 DIAGNOSIS — M40209 Unspecified kyphosis, site unspecified: Secondary | ICD-10-CM | POA: Diagnosis not present

## 2023-08-02 MED ORDER — GADOBUTROL 1 MMOL/ML IV SOLN
7.0000 mL | Freq: Once | INTRAVENOUS | Status: AC | PRN
  Administered 2023-08-02: 7 mL via INTRAVENOUS

## 2023-08-02 NOTE — Addendum Note (Signed)
 Addended by: Elease Etienne A on: 08/02/2023 11:57 AM   Modules accepted: Orders

## 2023-08-02 NOTE — Progress Notes (Signed)
 Remote pacemaker transmission.

## 2023-08-03 ENCOUNTER — Encounter: Payer: Self-pay | Admitting: Cardiovascular Disease

## 2023-08-07 ENCOUNTER — Encounter: Payer: Self-pay | Admitting: Neurology

## 2023-08-07 ENCOUNTER — Telehealth: Payer: Self-pay | Admitting: Neurology

## 2023-08-07 NOTE — Telephone Encounter (Signed)
 Pt called to check on MRI results.  Informed patient will receive a call from the nurse after Dr. Tresia Fruit goes over the results.

## 2023-08-07 NOTE — Telephone Encounter (Signed)
 This is a duplicate call.

## 2023-08-07 NOTE — Telephone Encounter (Signed)
 I called [facility]. They are going to escalate the MRI readings.

## 2023-08-07 NOTE — Telephone Encounter (Signed)
 Pt called and LVM wanting to know if his MRI results have come in. Please advise.

## 2023-08-08 ENCOUNTER — Encounter: Payer: Self-pay | Admitting: Family Medicine

## 2023-08-11 ENCOUNTER — Encounter: Payer: Self-pay | Admitting: Internal Medicine

## 2023-08-11 ENCOUNTER — Encounter: Payer: Self-pay | Admitting: Neurology

## 2023-08-13 ENCOUNTER — Telehealth: Payer: Self-pay | Admitting: Family Medicine

## 2023-08-13 NOTE — Telephone Encounter (Signed)
 I thank you for your help and I need your input.  This patient has persistent dizziness and I did not know if there were any other options at this point for him to consider.  Many thanks.

## 2023-08-15 ENCOUNTER — Ambulatory Visit: Admitting: Physical Therapy

## 2023-08-15 ENCOUNTER — Encounter: Payer: Self-pay | Admitting: Family Medicine

## 2023-08-15 ENCOUNTER — Encounter: Payer: Self-pay | Admitting: Neurology

## 2023-08-15 NOTE — Telephone Encounter (Signed)
 Dr. Vallarie Gauze, unfortunately I have no other ideas or workup to offer except for vestibular therapy I'm sorry! I wish I did. As you know we have extensively worked up, imaged and evaluated. I wish I did have more ideas for this nice patient. [name] [name]

## 2023-08-16 NOTE — Telephone Encounter (Signed)
 Spoke with patient and he has already set up for the therapy with Dr. Tresia Fruit office. He was suppose to go yesterday but had to cancel due to his wife not feeling well so he is going to call and reschedule appointment.

## 2023-08-16 NOTE — Telephone Encounter (Signed)
 See below.  I appreciate the help fro Dr. Tresia Fruit.  Please offer patient vestibular rehab.  Let me know if he needs a referral.  Thanks.

## 2023-08-16 NOTE — Telephone Encounter (Signed)
 Noted. Thanks.

## 2023-08-21 ENCOUNTER — Ambulatory Visit (INDEPENDENT_AMBULATORY_CARE_PROVIDER_SITE_OTHER): Admitting: Family Medicine

## 2023-08-21 ENCOUNTER — Encounter: Payer: Self-pay | Admitting: Family Medicine

## 2023-08-21 VITALS — BP 158/62 | HR 63 | Temp 97.9°F | Ht 70.0 in | Wt 187.0 lb

## 2023-08-21 DIAGNOSIS — R2689 Other abnormalities of gait and mobility: Secondary | ICD-10-CM

## 2023-08-21 DIAGNOSIS — R059 Cough, unspecified: Secondary | ICD-10-CM

## 2023-08-21 MED ORDER — FLUTICASONE PROPIONATE 50 MCG/ACT NA SUSP: 1.0000 | Freq: Every day | NASAL | 1 refills | Status: DC

## 2023-08-21 NOTE — Progress Notes (Unsigned)
 He took hydralazine  at night recently w/o ADE on med. Not taking losartan  currently.    Episodic nausea and vertigo/dizziness.  Not constant but sig sx with events.  Using walking at baseline.  He isn't driving.  He feels unsteady.  Discussed.  He has vestibular rehab pending.   He can have episodes at rest sitting that are different from standing up.  He can have room spinning at rest and lightheadedness upon standing. Those feel different.    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   Phlegm in the throat, having to clear his throat.  Unclear if from post nasal gtt.  Runny nose, cough, going on for months.  No change with taking amoxil .  Already taking zyrtec .   Some BLE edema, chronically noted.   He had leg aches at baseline, caring for his wife.   Meds, vitals, and allergies reviewed.   ROS: Per HPI unless specifically indicated in ROS section   Nad Ncat Neck supple, no LA Ctab Abd soft, not ttp Skin well perfused.  IRR 1+ BLE edema.    43 minutes were devoted to patient care in this encounter (this includes time spent reviewing the patient's file/history, interviewing and examining the patient, counseling/reviewing plan with patient).

## 2023-08-21 NOTE — Patient Instructions (Addendum)
 The episodes of room spinning while sitting may be related to the previous stroke.    The lightheadedness with standing may be related to your BP and heart rate.   I would stop losartan  and hydralazine  in the meantime.  I need an update from cardiology about your pacer.  I'll check with them.   If looks like your BP cuff is reading inaccurately high.  I would stop using it.   I would try using flonase  for allergy symptoms and see if that helps the cough.   Take care.  Glad to see you.

## 2023-08-23 ENCOUNTER — Encounter: Payer: Self-pay | Admitting: Internal Medicine

## 2023-08-23 NOTE — Assessment & Plan Note (Signed)
 I would try using flonase  for allergy symptoms and see if that helps the cough.

## 2023-08-23 NOTE — Telephone Encounter (Signed)
 I spoke with the pt and transmission received. 08/23/2023

## 2023-08-23 NOTE — Assessment & Plan Note (Signed)
 Discussed that the vertigo could be due to stroke.  Vestibular rehab is reasonable.   The lightheadedness could be due to orthostatic sx.   His BP cuff reads sig higher than ours, so some of his "high" BPs as home may not have been that high.    I would stop losartan  and hydralazine  in the meantime.  I need an update from cardiology about his pacer.  I'll check with them.   If looks like his BP cuff is reading inaccurately high.  I would stop using it.

## 2023-08-25 ENCOUNTER — Encounter: Payer: Self-pay | Admitting: Internal Medicine

## 2023-08-28 ENCOUNTER — Encounter: Payer: Self-pay | Admitting: Family Medicine

## 2023-08-28 ENCOUNTER — Ambulatory Visit: Attending: Neurology | Admitting: Physical Therapy

## 2023-08-28 ENCOUNTER — Encounter: Payer: Self-pay | Admitting: Physical Therapy

## 2023-08-28 VITALS — BP 175/69 | HR 61

## 2023-08-28 DIAGNOSIS — R42 Dizziness and giddiness: Secondary | ICD-10-CM | POA: Insufficient documentation

## 2023-08-28 DIAGNOSIS — R2689 Other abnormalities of gait and mobility: Secondary | ICD-10-CM | POA: Insufficient documentation

## 2023-08-28 DIAGNOSIS — R2681 Unsteadiness on feet: Secondary | ICD-10-CM | POA: Insufficient documentation

## 2023-08-28 NOTE — Therapy (Unsigned)
 OUTPATIENT PHYSICAL THERAPY VESTIBULAR EVALUATION     Patient Name: [name] [name] MRN: [id number] DOB:1934-11-08, 89 y.o., male Today's Date: 08/29/2023  END OF SESSION:  PT End of Session - 08/28/23 1621     Visit Number 1    Number of Visits 5   eval + 4 visits   Date for PT Re-Evaluation 10/06/23   pushed out 1 week due to lack of schedule availability for pt's preferred times   Authorization Type Medicare    Authorization Time Period 08-28-23 - 10-28-23    PT Start Time 0932    PT Stop Time 1015    PT Time Calculation (min) 43 min    Activity Tolerance Patient tolerated treatment well    Behavior During Therapy [facility] for tasks assessed/performed              Past Medical History:  Diagnosis Date   Arthritis    Atrial fibrillation, permanent (HCC)    Bladder neoplasm    Chronic cough    NO CARDIAC OR PULMONARY RELATED   Coronary artery disease CARDIOLOGIST-  DR KLEIN/ [facility]   a. s/p CABG 2004, b. LHC with patent grafts 07/2005, ejection fraction 50%;  c. 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 Amiodarone  Rx   S/P CABG x 5 2004   S/P TAVR (transcatheter aortic valve replacement) 11/15/2022   s/p TAVR with a 29mm Edwards S3UR via the TF approach by Dr. Arlester Ladd & Honey Lusty.   Severe aortic stenosis    Past Surgical History:  Procedure Laterality Date   CARDIAC CATHETERIZATION  07-26-2005  DR GAMBLE   PRESERVED LVF/  EF 50%/ PATENT GRAFTS   CARDIAC CATHETERIZATION  03-04-2003  DR GAMBLE   SEVERE 3 VESSEL DISEASE   CARDIAC CATHETERIZATION  1991  DR [facility]   PAF/ FALSE POSITIVE STRESS TEST   CAROTID ENDARTERECTOMY Left  2022   CATARACT EXTRACTION W/ INTRAOCULAR LENS IMPLANT Right    CORONARY ARTERY BYPASS GRAFT  03-08-2003  DR [id number]   LIMA TO LAD/ SVG TO OM2/  SVG TO DIAGONAL / SVG TO PDA & PLA   CYSTOSCOPY WITH BIOPSY N/A 12/25/2012   Procedure: CYSTOSCOPY WITH BLADDER BIOPSY;  Surgeon: Moise Anes, MD;  Location: [facility];  Service: Urology;  Laterality: N/A;   ELECTROPHYSIOLOGY STUDY  07-27-2005  DR Manya Sells   MAPPING --   RESULT NONINDUCIBLE VT OR SVT/  DX RIGHT VENTRICULAR OUTFLOW TRACT VT AND RULES OUT MORE MALIGNANT CAUSES OF VT   ENDARTERECTOMY Left 07/08/2020   Procedure: LEFT CAROTID ENDARTERECTOMY;  Surgeon: Margherita Shell, MD;  Location: [facility] OR;  Service: Vascular;  Laterality: Left;   INGUINAL HERNIA REPAIR Bilateral 1954  &  1990   INTRAOPERATIVE TRANSTHORACIC ECHOCARDIOGRAM N/A 11/15/2022   Procedure: INTRAOPERATIVE TRANSTHORACIC ECHOCARDIOGRAM;  Surgeon: Arnoldo Lapping, MD;  Location[facility] INVASIVE CV LAB;  Service: Open Heart Surgery;  Laterality: N/A;   KNEE ARTHROSCOPY Right 1994   LEFT HEART CATH AND CORS/GRAFTS ANGIOGRAPHY N/A 10/14/2022   Procedure: LEFT HEART CATH AND CORS/GRAFTS ANGIOGRAPHY;  Surgeon: Arnoldo Lapping, MD;  Location:  [facility] INVASIVE CV LAB;  Service: Cardiovascular;  Laterality: N/A;   LUMBAR DISC SURGERY  1999   L4 -- L5   MOHS SURGERY     by Dr. Brynn Caras 2019   PACEMAKER IMPLANT N/A 04/03/2023   Procedure: PACEMAKER IMPLANT;  Surgeon: Verona Goodwill, MD;  Location: [facility] INVASIVE CV LAB;  Service: Cardiovascular;  Laterality: N/A;   REMOVAL VOCAL CORD POLYPS  1978   TONSILLECTOMY  AS CHILD   TRANSCATHETER AORTIC VALVE REPLACEMENT, TRANSFEMORAL N/A 11/15/2022   Procedure: Transcatheter Aortic Valve Replacement, Transfemoral;  Surgeon: Arnoldo Lapping, MD;  Location: [facility] INVASIVE CV LAB;  Service: Open Heart Surgery;  Laterality: N/A;   TRANSTHORACIC ECHOCARDIOGRAM  08/08/2011   MILD LVH/  EF 55-60%/  GRADE I DIASTOLIC DYSFUNCTION/ MILD AV  STENOSIS   Patient Active Problem List   Diagnosis Date Noted   Sinus pressure 06/25/2023   History of CVA in adulthood 01/23/2023   Pain of lower extremity 01/23/2023   RBBB 11/15/2022   S/P TAVR (transcatheter aortic valve replacement) 11/15/2022   DNR (do not resuscitate) 08/08/2022   Permanent atrial fibrillation (HCC) 02/22/2022   Chronic pain of both knees 12/25/2021   Bilateral leg pain 09/28/2021   Advance care planning 10/28/2020   Bilateral pseudophakia 07/27/2020   Carpal tunnel syndrome 07/27/2020   Dermatochalasis 07/27/2020   Hematuria 07/27/2020   Insomnia 07/27/2020   Radiculopathy 07/27/2020   Stenosis of internal carotid artery with cerebral infarction, right (HCC) 07/08/2020   Imbalance 03/27/2020   Retinal artery occlusion 03/10/2020   Constipation 12/22/2019   Rhinitis sicca 05/22/2018   Sciatica 12/28/2017   Neuropathy 10/07/2017   Foot swelling 10/07/2017   CAD (coronary artery disease), autologous vein bypass graft 10/31/2016   Family history of colon cancer 10/31/2016   History of adenomatous polyp of colon 10/31/2016   Osteoarthritis 10/31/2016   Skin lesion 06/04/2015   Elevated TSH 12/26/2014   Severe aortic stenosis 05/16/2013   Bladder neoplasm 11/23/2012   BCC (basal cell carcinoma of skin) 08/29/2012   GERD (gastroesophageal reflux disease) 05/01/2012   Hyperlipidemia 03/14/2012   TMJ pain dysfunction syndrome 01/20/2012   Long term (current) use of anticoagulants 08/31/2011   RVOT ventricular tachycardia (HCC)    Cough 04/29/2009   HEARING LOSS, SUDDEN 12/03/2007   HTN (hypertension) 02/22/2007   S/P CABG x 5 2004    PCP: Donnie Galea, MD  REFERRING PROVIDER:  Glory Larsen, MD  REFERRING DIAG:  Diagnosis  R42 (ICD-10-CM) - Dizziness  R42 (ICD-10-CM) - Vertigo  R26.89 (ICD-10-CM) - Imbalance   THERAPY DIAG:  Dizziness and giddiness  Unsteadiness on feet  Other abnormalities of gait and mobility  ONSET DATE: July  2024  Rationale for Evaluation and Treatment: Rehabilitation  SUBJECTIVE:   SUBJECTIVE STATEMENT:  Pt states he had aortic valve replacement in July 2024 and states the dizziness has gotten worse since this surgery: says his legs hurt.   Dizziness doesn't affect him lying down or sitting, but states he does feel mildly dizzy just sitting here at this time.  Uses rollator for assistance with household amb.  Pt states he has episodes of "vision scattering" - last episode occurred about a week and a half ago - dizziness spontaneously occurs.  Pt reports he is no longer driving because he is afraid he will have an episode of the vision loss while he is driving.    Per PT eval note 02-17-23:  "Pt reports his wife had a fall and fractured  her vertebrae and diagnosed with Parkinson's. Pt is the primary caregiver. States thinks were fine until May/June. Pt had brainstem stroke ~1.5 years ago. Has had on/off dizziness from that. Had an aortic valve in July and since then has had a decline in his function noting decrease in strength and balance. Pt reports more issues with his dizziness. In September had another minor stroke. Dizziness is constant now when he's upright. Dr. Tresia Fruit and Dr. Janett Medin all say dizziness from the CVA. Doesn't have another appointment until February to see Dr. Tresia Fruit again. Was recommended to try some vestibular rehab. Has to help his wife with transfers at home but then lost his balance one day resulting in a fall. Pt notes some spinning sensations after he has bent over (last episode of spinning occurred this morning). Spinning had started ~3-4 months ago. Pt states he is very unstable without his walker. Vision is blurry and gets lightheaded. Pt reports he currently has HHPT seeing him." Pt accompanied by: self  PERTINENT HISTORY: Eye stroke (No vision in upper half of L eye), Brainstem CVA x 2  PAIN:  Are you having pain? No  PRECAUTIONS: Fall  RED FLAGS: None   WEIGHT BEARING  RESTRICTIONS: No  FALLS: Has patient fallen in last 6 months? Yes. Number of falls 1  LIVING ENVIRONMENT: Lives with: lives with their spouse Lives in: House/apartment Stairs: 8 steps with hand rail Has following equipment at home: Walker - 4 wheeled  PLOF: Independent  PATIENT GOALS: Decrease dizziness, reduce risk of falls, continue to care take for his wife  OBJECTIVE:  Note: Objective measures were completed at Evaluation unless otherwise noted.  DIAGNOSTIC FINDINGS: 01/05/23 Brain MRI FINDINGS:    Small left frontal and right parietal DWI hyperintensities.  The left frontal lesions are isointense on ADC maps.  The right parietal periventricular lesion is slightly hypointense on ADC map, and demonstrates subtle contrast-enhancement.  May represent foci of acute/subacute ischemia.    No abnormal lesions are seen on diffusion-weighted views to suggest acute ischemia. The cortical sulci, fissures and cisterns are normal in size and appearance. Lateral, third and fourth ventricle are normal in size and appearance. No extra-axial fluid collections are seen. No evidence of mass effect or midline shift.  Left thalamic cystic lesion (1cm) stable from 2017. Moderate periventricular and subcortical foci of chronic small vessel ischemic disease. No abnormal lesions are seen on post contrast views.     On sagittal views the posterior fossa, pituitary gland and corpus callosum are unremarkable. No evidence of intracranial hemorrhage on SWI views. The orbits and their contents, paranasal sinuses and calvarium are unremarkable.  Intracranial flow voids are present.     IMPRESSION:    MRI brain (with and without) demonstrating: -Restricted diffusion within the right parietal periventricular region may represent acute ischemia. Additional foci in the left frontal region may represent additional subacute ischemic changes. -Moderate chronic small vessel ischemic disease.   COGNITION: Overall  cognitive status: Within functional limits for tasks assessed   SENSATION: WFL  POSTURE:  rounded shoulders and increased thoracic kyphosis  Cervical ROM:  Full ROM in flexion and rotation, mildly limited in extension  STRENGTH: Pt reports increased weakness in the last year  LOWER EXTREMITY MMT:   MMT Right eval Left eval  Hip flexion    Hip abduction    Hip adduction    Hip internal rotation    Hip external rotation    Knee flexion    Knee extension  Ankle dorsiflexion    Ankle plantarflexion    Ankle inversion    Ankle eversion    (Blank rows = not tested)  BED MOBILITY:  Independent  TRANSFERS: Independent  GAIT: Gait pattern: step through pattern and trunk flexed Distance walked: clinic distances Assistive device utilized: Environmental consultant - 4 wheeled (rollator) Level of assistance: Modified independence   VESTIBULAR ASSESSMENT:  Vitals:   08/28/23 1003  BP: (!) 175/69  Pulse: 61     GENERAL OBSERVATION: Guarded and slow   SYMPTOM BEHAVIOR:  Subjective history: Rates a baseline dizziness 8/10  Non-Vestibular symptoms: changes in vision  Type of dizziness: Blurred Vision, Spinning/Vertigo, Unsteady with head/body turns, and Lightheadedness/Faint  Frequency: Constant, but notes spinning sensations occasionally  Duration: Hours  Aggravating factors: Induced by motion: bending down to the ground, turning body quickly, and turning head quickly  Relieving factors: head stationary and slow movements  Progression of symptoms: unchanged  OCULOMOTOR EXAM:  Ocular Alignment: normal  Ocular ROM: No Limitations  Spontaneous Nystagmus: absent  Gaze-Induced Nystagmus: right beating with left gaze  Smooth Pursuits: saccades  Saccades: slow  FRENZEL - FIXATION SUPRESSED: Did not assess  VESTIBULAR - OCULAR REFLEX:   Slow VOR: Normal  VOR Cancellation: Corrective Saccades more to the L than R  Head-Impulse Test: HIT Right: negative HIT Left:  positive   POSITIONAL TESTING:  Rt sidelying test = (-) for nystagmus and c/o vertigo  Lt sidelying test = (-) for nystagmus and c/o vertigo  MOTION SENSITIVITY:  Motion Sensitivity Quotient Intensity: 0 = none, 1 = Lightheaded, 2 = Mild, 3 = Moderate, 4 = Severe, 5 = Vomiting  Intensity  1. Sitting to supine   2. Supine to L side   3. Supine to R side   4. Supine to sitting   5. L Hallpike-Dix   6. Up from L    7. R Hallpike-Dix   8. Up from R    9. Sitting, head tipped to L knee   10. Head up from L knee   11. Sitting, head tipped to R knee   12. Head up from R knee   13. Sitting head turns x5   14.Sitting head nods x5   15. In stance, 180 turn to L    16. In stance, 180 turn to R     OTHOSTATICS: Not assessed       VESTIBULAR TREATMENT:                                                                                                   DATE: 08-28-23  PATIENT EDUCATION: Education details: BPPV etiology; reviewed exercises issued in 2023 Person educated: Patient Education method: Medical illustrator Education comprehension: verbalized understanding and needs further education  HOME EXERCISE PROGRAM: issue written copy next session  GOALS: Goals reviewed with patient? Yes  SHORT TERM GOALS: same as LTG's as ELOS = 4 weeks     LONG TERM GOALS: Target date:  10-06-23   Pt will be independent with HEP for balance and vestibular exercises. Baseline:  Goal status: INITIAL  2.  Pt will report at least 25% improvement in dizziness to increase safety with mobility. Baseline:  Goal status: INITIAL     ASSESSMENT:  CLINICAL IMPRESSION: Patient is an 89 y.o. gentleman who was seen today for physical therapy evaluation and treatment for dizziness and decreased balance. PMH significant for 2 prior brainstem CVAs and an "eye stroke" affecting L eye visual field.  Etiology of dizziness is unknown at this time.  Pt has been seen at this facility for  dizziness in the past including 3 visits in May-June 2023.  Per chart notes dizziness may be due to h/o CVA's.  Pt is currently using a rollator for assistance with ambulation.  Pt has elevated BP in today's session with reading 175/69.  Dizziness appears to be multi-factorial in etiology with component of orthostatic hypotension.    OBJECTIVE IMPAIRMENTS: Abnormal gait, decreased activity tolerance, decreased balance, decreased coordination, decreased endurance, decreased mobility, difficulty walking, decreased strength, dizziness, improper body mechanics, and postural dysfunction.   ACTIVITY LIMITATIONS: carrying, lifting, bending, standing, squatting, stairs, transfers, bed mobility, bathing, toileting, dressing, locomotion level, and caring for others  PARTICIPATION LIMITATIONS: meal prep, cleaning, laundry, interpersonal relationship, and community activity  PERSONAL FACTORS: Age, Fitness, Past/current experiences, Time since onset of injury/illness/exacerbation, and Transportation are also affecting patient's functional outcome.   REHAB POTENTIAL: Good  CLINICAL DECISION MAKING: Evolving/moderate complexity  EVALUATION COMPLEXITY: Moderate   PLAN:  PT FREQUENCY: 1x/week  PT DURATION:  4 weeks + eval  PLANNED INTERVENTIONS: 97110-Therapeutic exercises, 97530- Therapeutic activity, 97112- Neuromuscular re-education, [phone]- Self Care, [id number]- Gait training, Patient/Family education, Balance training, Stair training, Dry Needling, Joint mobilization, Spinal mobilization, Vestibular training, DME instructions, Cryotherapy, and Moist heat  PLAN FOR NEXT SESSION: Issue HEP - x1 viewing and balance on foam   Mariah Harn, Celeste Cola, PT 08/29/2023, 1:16 PM

## 2023-09-07 ENCOUNTER — Encounter: Payer: Self-pay | Admitting: Family Medicine

## 2023-09-10 ENCOUNTER — Encounter: Payer: Self-pay | Admitting: Internal Medicine

## 2023-09-11 ENCOUNTER — Other Ambulatory Visit: Payer: Self-pay | Admitting: Family Medicine

## 2023-09-11 MED ORDER — SULFAMETHOXAZOLE-TRIMETHOPRIM 800-160 MG PO TABS: 1.0000 | ORAL_TABLET | Freq: Two times a day (BID) | ORAL | 0 refills | Status: DC

## 2023-09-18 ENCOUNTER — Encounter: Payer: Self-pay | Admitting: Family Medicine

## 2023-09-19 ENCOUNTER — Ambulatory Visit: Admitting: Physical Therapy

## 2023-09-20 ENCOUNTER — Other Ambulatory Visit: Payer: Self-pay | Admitting: Family Medicine

## 2023-09-20 MED ORDER — ROPINIROLE HCL 0.25 MG PO TABS: 0.2500 mg | ORAL_TABLET | Freq: Every day | ORAL | Status: DC | PRN

## 2023-09-21 ENCOUNTER — Other Ambulatory Visit: Payer: Self-pay | Admitting: Family Medicine

## 2023-09-21 MED ORDER — ROPINIROLE HCL 0.25 MG PO TABS: 0.2500 mg | ORAL_TABLET | Freq: Every day | ORAL | 1 refills | Status: DC | PRN

## 2023-09-25 ENCOUNTER — Telehealth: Payer: Self-pay | Admitting: Neurology

## 2023-09-25 ENCOUNTER — Ambulatory Visit: Payer: Medicare Other | Admitting: Neurology

## 2023-09-25 ENCOUNTER — Encounter: Payer: Self-pay | Admitting: Neurology

## 2023-09-25 VITALS — BP 170/60 | HR 62 | Ht 70.0 in | Wt 188.0 lb

## 2023-09-25 DIAGNOSIS — R42 Dizziness and giddiness: Secondary | ICD-10-CM | POA: Diagnosis not present

## 2023-09-25 DIAGNOSIS — M7989 Other specified soft tissue disorders: Secondary | ICD-10-CM | POA: Diagnosis not present

## 2023-09-25 NOTE — Patient Instructions (Addendum)
-   [facility] to check and make sure no clots in the lower legs - Dr. Donalee Fruits [facility] Th[facility] to make sure no inner ear etiology for the vertigo (last saw him 2021 and also having frequent nose bleeds) so will have NET evaluate - The next thing is to go to [facility]  - Talk to Ms. Dilday

## 2023-09-25 NOTE — Progress Notes (Unsigned)
 [facility]    Provider:  Dr Tresia [name] Requesting Provider: Dr. Vallarie [name] Primary Care Provider:  Donnie Galea, MD  CC:  dizziness  Right now is whole chest is "vibrating", no palpitations, no shortness of breath, he feels anxious, his wife has parkinson;s disease, today he was in the kitchen with his walker and the room was spinning, has happened when sitting as well, the room is spinning and brief. Have sent to vestibular therapy. Has been to cardiology, Has been to pcp,  Right now nothing is moving or turning but he feels "dizzy, out of whack" currently and all the time.  Saw [name] [name] ENT in 2021 so ongoing for many years some "intermittent lightheadedness and imbalance" says the Atrial valve replacement made it worse. [name] [name], ENT, stated "This is not an ear related phenomenon". [name] [name] does not this this is orthostatic in cardiology. He feels is getting worse but the last MRI did not show any new vascular disease and now he is having true vertigionous symptoms which he was not when he saw ENT [name] [name] in 2021. He has a lot of swelling in his legs. Ongoing for weeks has been longer but a lot worse in the last few weeks (has not Ropinerole for the RLS so is not that). He has had his blood pressure medications changed and stopped and that has not made a positive difference.   Dizziness is worse. Feels unstable when standing or walking. Has restless legs but haven't been as bad in the last several nights. Has not taking the Ropinerole in about a month. Afraid to shower because of the imbalance. He has a lot of low back pain and thigh pain as well. He has a chronic hx of LBP and last MRI lumbar spine by [name] [name] showed 2019: Resolution of previously seen left foraminal disc extrusion at L3-4. No residual stenosis and unchanged mild bilateral L2-3 neural foraminal stenosis. He has had a nose bleed 9another reason to be evaluated by ENT as occurring frequently). Then recommend  [facility]. He has been to Vestibular theraoy multiple times and has another appointment in a few weeks. Dizziness is constant, not with just standing or changing positions, ongoing even when sitting, having superimposed vertiginous  episodes. Epley maneuvers did not help. 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   Patient complains of symptoms per HPI as well as the following symptoms: none . Pertinent negatives and positives per HPI. All others negative    06/20/2023: Dizziness happens ny time but cn happen when he stnding. He hs been sitting and felt him going around and around. 1x a week or every 2wweks. During the day. Granddaughter states certain triggers are standing, sitting up, turn and accelerate. More lightheaded, like going to pass oiut. Vision gets blurry, he has seen a retina doctor and his vision is good when they checked his vision. But the spells are "lightheaded, blurry vision, like he is going to pass out" . He has fallen. He is in his recliner, tried to get up and grab a hold of his walker and he can try and get up and feels weak since te surgery and had a cortizone shot in her knees. Usues his right leg when helpin ghis wife, he has to get her in her bed.   Patient complains of symptoms per HPI as well as the following symptoms: caregiver stress . Pertinent negatives and positives per HPI.  All others negative  12/2022: He was seen by [name] [name] who is our vascular neurologist due to MRI which showed lacunar strokes, he was told to continue [name] [name]  daily for secondary stroke prevention given his history of longstanding A-fib but [name] [name] did not feel addition of aspirin  was indicated due to increased bleeding risk without clearly documented benefit.  Addendum 12/21/2022: patient's symptoms are worsening. There was a question of CNS lymphoma In past MRIs. Need to surveille the brain. Will order repeat.  03/31/2022: He say an  orthopaedist and was recommended knee replacement which can be causing the radiating pain in the legs. Now worsening low back pain (points to the SI joint pain), no radiculopathy, no new vision symptoms or other new symptoms related to the past MRI brain nothing else new. He has also had gerd for years, or at least he has been told gerd, been to pulmonary, ENT, PCP, he feel phlegm in the throat, he can cough it up pretty clear, may be sinuses, no burning in the sternum. He has discomfort in the throat. Never had an upper GI scope especially if it is gerd may have barrett's esophagus? Out of my area, see assessment/plan for specifics.  Patient complains of symptoms per HPI as well as the following symptoms: leg and back pain, coughing . Pertinent negatives and positives per HPI. All others negative   12/24/2021: 89 y.o. male here as requested by [name] Galea, MD for possible CNS lymphoma.  He is a referral from [name]. Candi [name].  [name]. Candi [name] seeing him for skew deviation.  There was an occipital stroke noted.  However also in the MRI showed yet impression read that impression right increased signal intensity on the diffusion weighted imaging in the posterior horn of the right lateral ventricle, some of these areas demonstrate peripheral curvilinear enhancement, this is nonspecific and differential diagnosis includes primary CNS lymphoma versus secondary lymphoma versus metastatic disease versus postinfectious process versus less likely inflammatory process such as sarcoid, demyelinating disease or sarcoidosis.  Extensive workup (see below) was negative and repeat MRI of the brain did not show the concerning findings anymore. I discussed given repeat MRI without findings, patient improvement, and extensive workup including panscan, LP, blood work showing no abnormalities, we can just monitor. I review prior MRI images and new MRI images with patient.   He wants to discuss new problem: A few months ago, he has had  aching legs and thighs, not ocnstant but off and on during the night and down into his calfs. He may have some ankle swelling. Ankle pain ongoing for 20+ years, he feels some weakness in the legs and progressive low back pain, has had multiple surgeries with [name] [name]. If he sits a while and he gets up it is difficult to get up and it hurts in the knees and lower back. He needs knee replacements, symptoms are worse in the right than the left leg. Almost every night they ache, a knee brace helps with the aching and so do compression socks they come above the knees. He has swelling, feels tight like a tourniquet, has had issues for 20+ years, right > left leg discomfort, arms are fine, if he gets on the floor he has to roll over and get up, sometimes hurts to get up(points to the knees and above the knees), more difficult to walk uphill and stairs. Aspirin  helps, tylenol  does not.   Patient complains of symptoms per HPI as well as the following symptoms: leg  pain . Pertinent negatives and positives per HPI. All others negative   August 16, 2021: I am speaking with patient and his wife over the phone to follow-up on finding.  Patient was seen by [name]. Candi [name] for diplopia, MRI of the brain showed an occipital stroke but also right increased signal intensity in the diffusion weighted imaging in the posterior horn of the right lateral ventricle some of these demonstrating peripheral curvilinear enhancement, nonspecific and differential diagnosis includes primary CNS lymphoma versus secondary lymphoma versus metastatic disease versus a postinfectious process or inflammatory process, demyelinating disease or sarcoidosis.  We reviewed his work-up today which unremarkable:  CT of the abdomen and pelvis showed No evidence for a neoplastic or lymphoproliferative process in the chest, abdomen or pelvis LP showed normal glucose, protein, cell count, flow cytometry did not find enough cells to examine, protein, cell  differential, HSV, sarcoid, fungal culture, CMV, EBV, VDRL, oligoclonal bands in CSF IgG index were all unremarkable Labs were all unremarkable including IgG and IgM CMV antibodies, sed rate, ANA with reflex, ACE, CRP, SPEP/IFE, ANCA all unremarkable  At this time I recommended we repeat MRI of the brain in 2 to 3 months.  Patient is stable.  No worsening.  If he worsens we will repeat the MRI sooner, he is to reach out to us .  HPI:  [name] [name] is a 89 y.o. male here as requested by [name] Galea, MD for possible CNS lymphoma.  He is a referral from [name]. Candi [name].  [name]. Candi [name] seeing him for skew deviation.  There was an occipital stroke noted.  However also in the MRI showed yet impression read that impression right increased signal intensity on the diffusion weighted imaging in the posterior horn of the right lateral ventricle, some of these areas demonstrate peripheral curvilinear enhancement, this is nonspecific and differential diagnosis includes primary CNS lymphoma versus secondary lymphoma versus metastatic disease versus postinfectious process versus less likely inflammatory process such as sarcoid, demyelinating disease or sarcoidosis.  Recommend lumbar puncture.  I spent a long time discussing this with patient and wife, wife appears to have Parkinson's disease and possibly Parkinson's disease dementia I did not get the feeling as she understood, I tried to explain to them what we do not know what this is a we need more testing, they could not understand why I did not know what it was, I tried to go through the entire differential with them.  At this time organ to do an extensive work-up.  Reviewed notes, labs and imaging from outside physicians, which showed: see above  Review of Systems: Patient complains of symptoms per HPI as well as the following symptoms diplopia. Pertinent negatives and positives per HPI. All others negative.   Social History   Socioeconomic History   Marital  status: Married    Spouse name: [name]   Number of children: 3   Years of education: Not on file   Highest education level: Not on file  Occupational History   Occupation: Retired  Tobacco Use   Smoking status: Former    Current packs/day: 0.00    Average packs/day: 1 pack/day for 25.0 years (25.0 ttl pk-yrs)    Types: Cigarettes    Start date: 04/26/1955    Quit date: 04/25/1980    Years since quitting: 43.4   Smokeless tobacco: Never  Vaping Use   Vaping status: Never Used  Substance and Sexual Activity   Alcohol use: No    Alcohol/week: 0.0 standard drinks  of alcohol   Drug use: No   Sexual activity: Not on file  Other Topics Concern   Not on file  Social History Narrative   [employer] '[employer]-'[employer], overseas to [place]   Some care through [place]   No service disability   Retired    Lives with wife    Social Drivers of Corporate investment banker Strain: Low Risk  (02/13/2023)   Overall Financial Resource Strain (CARDIA)    Difficulty of Paying Living Expenses: Not hard at all  Food Insecurity: No Food Insecurity (02/13/2023)   Hunger Vital Sign    Worried About Running Out of Food in the Last Year: Never true    Ran Out of Food in the Last Year: Never true  Transportation Needs: No Transportation Needs (02/13/2023)   PRAPARE - Administrator, Civil Service (Medical): No    Lack of Transportation (Non-Medical): No  Physical Activity: Insufficiently Active (02/13/2023)   Exercise Vital Sign    Days of Exercise per Week: 2 days    Minutes of Exercise per Session: 40 min  Stress: No Stress Concern Present (02/13/2023)   [employer] of Occupational Health - Occupational Stress Questionnaire    Feeling of Stress : Not at all  Social Connections: Moderately Integrated (02/13/2023)   Social Connection and Isolation Panel [NHANES]    Frequency of Communication with Friends and Family: Three times a week    Frequency of Social Gatherings with Friends and Family: Once a  week    Attends Religious Services: 1 to 4 times per year    Active Member of [employer] or Organizations: No    Attends Banker Meetings: Never    Marital Status: Married  Catering manager Violence: Not At Risk (02/13/2023)   Humiliation, Afraid, Rape, and Kick questionnaire    Fear of Current or Ex-Partner: No    Emotionally Abused: No    Physically Abused: No    Sexually Abused: No    Family History  Problem Relation Age of Onset   Cancer Mother    Stroke Neg Hx     Past Medical History:  Diagnosis Date   Arthritis    Atrial fibrillation, permanent (HCC)    Bladder neoplasm    Chronic cough    NO CARDIAC OR PULMONARY RELATED   Coronary artery disease CARDIOLOGIST-  [name] KLEIN/ [facility]   a. s/p CABG 2004, b. LHC with patent grafts 07/2005, ejection fraction 50%;  c. 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 Amiodarone  Rx   S/P CABG x 5 2004   S/P TAVR (transcatheter aortic valve replacement) 11/15/2022   s/p TAVR with a 29mm Edwards S3UR via the TF approach by [name]. Arlester Ladd & Honey Lusty.   Severe aortic stenosis     Patient Active Problem List   Diagnosis Date Noted   Sinus pressure 06/25/2023   History of CVA in adulthood 01/23/2023   Pain of lower extremity 01/23/2023   RBBB 11/15/2022   S/P TAVR (transcatheter aortic valve replacement) 11/15/2022   DNR (do not resuscitate) 08/08/2022   Permanent atrial fibrillation (  HCC) 02/22/2022   Chronic pain of both knees 12/25/2021   Bilateral leg pain 09/28/2021   Advance care planning 10/28/2020   Bilateral pseudophakia 07/27/2020   Carpal tunnel syndrome 07/27/2020    Dermatochalasis 07/27/2020   Hematuria 07/27/2020   Insomnia 07/27/2020   Radiculopathy 07/27/2020   Stenosis of internal carotid artery with cerebral infarction, right (HCC) 07/08/2020   Imbalance 03/27/2020   Retinal artery occlusion 03/10/2020   Constipation 12/22/2019   Rhinitis sicca 05/22/2018   Sciatica 12/28/2017   Neuropathy 10/07/2017   Foot swelling 10/07/2017   CAD (coronary artery disease), autologous vein bypass graft 10/31/2016   Family history of colon cancer 10/31/2016   History of adenomatous polyp of colon 10/31/2016   Osteoarthritis 10/31/2016   Skin lesion 06/04/2015   Elevated TSH 12/26/2014   Severe aortic stenosis 05/16/2013   Bladder neoplasm 11/23/2012   BCC (basal cell carcinoma of skin) 08/29/2012   GERD (gastroesophageal reflux disease) 05/01/2012   Hyperlipidemia 03/14/2012   TMJ pain dysfunction syndrome 01/20/2012   Long term (current) use of anticoagulants 08/31/2011   RVOT ventricular tachycardia (HCC)    Cough 04/29/2009   HEARING LOSS, SUDDEN 12/03/2007   HTN (hypertension) 02/22/2007   S/P CABG x 5 2004    Past Surgical History:  Procedure Laterality Date   CARDIAC CATHETERIZATION  07-26-2005  [name] GAMBLE   PRESERVED LVF/  EF 50%/ PATENT GRAFTS   CARDIAC CATHETERIZATION  03-04-2003  [name] GAMBLE   SEVERE 3 VESSEL DISEASE   CARDIAC CATHETERIZATION  1991  [name]   PAF/ FALSE POSITIVE STRESS TEST   CAROTID ENDARTERECTOMY Left 2022   CATARACT EXTRACTION W/ INTRAOCULAR LENS IMPLANT Right    CORONARY ARTERY BYPASS GRAFT  03-08-2003  [name] RUEAVWUJ   LIMA TO LAD/ SVG TO OM2/  SVG TO DIAGONAL / SVG TO PDA & PLA   CYSTOSCOPY WITH BIOPSY N/A 12/25/2012   Procedure: CYSTOSCOPY WITH BLADDER BIOPSY;  Surgeon: Moise Anes, MD;  Location: [facility];  Service: Urology;  Laterality: N/A;   ELECTROPHYSIOLOGY STUDY  07-27-2005  [name] Manya Sells   MAPPING --   RESULT NONINDUCIBLE VT OR SVT/  DX RIGHT VENTRICULAR OUTFLOW TRACT  VT AND RULES OUT MORE MALIGNANT CAUSES OF VT   ENDARTERECTOMY Left 07/08/2020   Procedure: LEFT CAROTID ENDARTERECTOMY;  Surgeon: Margherita Shell, MD;  Location: [facility] OR;  Service: Vascular;  Laterality: Left;   INGUINAL HERNIA REPAIR Bilateral 1954  &  1990   INTRAOPERATIVE TRANSTHORACIC ECHOCARDIOGRAM N/A 11/15/2022   Procedure: INTRAOPERATIVE TRANSTHORACIC ECHOCARDIOGRAM;  Surgeon: Arnoldo Lapping, MD;  Location: [facility] INVASIVE CV LAB;  Service: Open Heart Surgery;  Laterality: N/A;   KNEE ARTHROSCOPY Right 1994   LEFT HEART CATH AND CORS/GRAFTS ANGIOGRAPHY N/A 10/14/2022   Procedure: LEFT HEART CATH AND CORS/GRAFTS ANGIOGRAPHY;  Surgeon: Arnoldo Lapping, MD;  Location: [facility] INVASIVE CV LAB;  Service: Cardiovascular;  Laterality: N/A;   LUMBAR DISC SURGERY  1999   L4 -- L5   MOHS SURGERY     by [name]. Brynn Caras 2019   PACEMAKER IMPLANT N/A 04/03/2023   Procedure: PACEMAKER IMPLANT;  Surgeon: Verona Goodwill, MD;  Location: [facility] INVASIVE CV LAB;  Service: Cardiovascular;  Laterality: N/A;   REMOVAL VOCAL CORD POLYPS  1978   TONSILLECTOMY  AS CHILD   TRANSCATHETER AORTIC VALVE REPLACEMENT, TRANSFEMORAL N/A 11/15/2022   Procedure: Transcatheter Aortic Valve Replacement, Transfemoral;  Surgeon: Arnoldo Lapping, MD;  Location: [facility] INVASIVE CV LAB;  Service: Open Heart Surgery;  Laterality:  N/A;   TRANSTHORACIC ECHOCARDIOGRAM  08/08/2011   MILD LVH/  EF 55-60%/  GRADE I DIASTOLIC DYSFUNCTION/ MILD AV STENOSIS    Current Outpatient Medications  Medication Sig Dispense Refill   acetaminophen  (TYLENOL ) 500 MG tablet Take 500-1,000 mg by mouth every 6 (six) hours as needed (pain.).     apixaban  ([name] [name] ) 5 MG TABS tablet Take 1 tablet (5 mg total) by mouth 2 (two) times daily. Take evening dose starting 07/09/20 180 tablet 3   Blood Pressure Monitoring (B-D ASSURE BPM/AUTO ARM CUFF) MISC Use check to BP.  Omron cuff if possible.  Dx. HTN 1 each 0   cetirizine  (ZYRTEC ) 10 MG tablet Take 1 tablet (10 mg total) by  mouth daily.     fluticasone  (FLONASE ) 50 MCG/ACT nasal spray Place 1 spray into both nostrils daily. 16 g 1   Iron , Ferrous Sulfate , 325 (65 Fe) MG TABS Take 325 mg by mouth daily.     latanoprost (XALATAN) 0.005 % ophthalmic solution Place 1 drop into both eyes at bedtime.     melatonin 3 MG TABS tablet Take 3 mg by mouth at bedtime.     Polyethyl Glycol-Propyl Glycol (SYSTANE ULTRA OP) Place 1-2 drops into both eyes once as needed (dry eyes.).     potassium chloride  (KLOR-CON  M) 10 MEQ tablet Take 10 mEq by mouth in the morning.     pravastatin  (PRAVACHOL ) 80 MG tablet Take 1 tablet (80 mg total) by mouth every evening. 90 tablet 0   No current facility-administered medications for this visit.    Allergies as of 09/25/2023 - Review Complete 09/25/2023  Allergen Reaction Noted   Hydralazine  hcl Other (See Comments) 03/22/2023   Ace inhibitors Other (See Comments) 12/17/2012   Zocor [simvastatin] Other (See Comments) 12/17/2012   Nsaids Other (See Comments) 09/17/2020   Prednisone  Other (See Comments) 10/29/2020   Cephalexin Rash    Chocolate Other (See Comments) 03/10/2020   Doxycycline  Other (See Comments) 04/30/2018   Gabapentin  Other (See Comments) 12/17/2019   Hycodan [hydrocodone  bit-homatrop mbr] Nausea And Vomiting 08/18/2011   Oxycodone  Nausea Only 09/01/2015    Vitals: BP (!) 170/60 Comment: maual  Pulse 62   Ht 5\' 10"  (1.778 m)   Wt 188 lb (85.3 kg)   BMI 26.98 kg/m  Last Weight:  Wt Readings from Last 1 Encounters:  09/25/23 188 lb (85.3 kg)   Last Height:   Ht Readings from Last 1 Encounters:  09/25/23 5\' 10"  (1.778 m)    Physical exam: Exam: Gen: NAD, conversant      CV: No palpitations or chest pain or SOB. VS: Breathing at a normal rate. Not febrile. Eyes: Conjunctivae clear without exudates or hemorrhage  Neuro: Detailed Neurologic Exam  Speech:    Speech is normal; fluent and spontaneous with normal comprehension.  Cognition:    The patient  is oriented to person, place, and time;     recent and remote memory intact;     language fluent;     normal attention, concentration, fund of knowledge Cranial Nerves:    The pupils are equal, round, and reactive to light. Visual fields are full Extraocular movements are intact.  The face is symmetric with normal sensation. The palate elevates in the midline. Hearing intact. Voice is normal. Shoulder shrug is normal. The tongue has normal motion without fasciculations.   Coordination: normal  Gait:    No abnormalities noted or reported, slightly wide based but independent  Motor Observation:   no involuntary  movements noted. Tone:    Appears normal  Posture:    Posture is normal. normal erect    Strength:    Strength is anti-gravity and symmetric in the upper and lower limbs.      Sensation: intact to LT, no reports of numbness or tingling or paresthesias        Assessment/Plan: 89 y.o. male here as requested by [name] Galea, MD for continued dizziness  We ave seen him before for strokes, leg pain, low back pain, SI joint pain, dizziness,   Extensive workup included panscan, LP, blood work showing no abnormalities, repeat MRIs, had consult with vascular neurologist [name] [name] as well. Neuro exam continues to be stable with mild imbalance on walking, dizziness may be vascular from his strokes, unclear if other etiology recommended f/u with ENT, swollen legs will get ultrasound.  Today he states he continues to reportedly be dizzy, ataxic, falls. We have reimaged him in the past ensure no other etiolgies such as new stroke, cervical myelopathy sees [name] [name] in cardiology does not think this is orthostatic), sees [name] [name], vestibular rehab and non-drug treatments as vestibular therapy and caretaker stress:  - [place] [facility] to check and make sure no clots in the lower legs - [name] [name] [facility] to make sure no inner ear etiology for the vertigo (last saw him 2021  and also having frequent nose bleeds) so will have NET evaluate - The next thing is to go to [facility]    Orders Placed This Encounter  Procedures   US  Venous Img Lower Bilateral (DVT)   Ambulatory referral to ENT  PRIOR evaluation for brain anomaly concerning for cns lympoma, but resolved:  - Patient was seen by [name]. Candi [name] for diplopia, MRI of the brain showed an occipital stroke but also right increased signal intensity in the diffusion weighted imaging in the posterior horn of the right lateral ventricle some of these demonstrating peripheral curvilinear enhancement, nonspecific and differential diagnosis includes primary CNS lymphoma versus secondary lymphoma versus metastatic disease versus a postinfectious process or inflammatory process, demyelinating disease or sarcoidosis.  We reviewed his work-up again today which unremarkable: Extensive workup (see below) was negative and repeat MRI of the brain did not show the concerning findings anymore. I discussed given repeat MRI without findings, patient improvement, and extensive workup including panscan, LP, blood work showing no abnormalities, we can just monitor. I review prior MRI images and new MRI images with patient.   CT of the abdomen and pelvis showed No evidence for a neoplastic or lymphoproliferative process in the chest, abdomen or pelvis LP showed normal glucose, protein, cell count, flow cytometry did not find enough cells to examine, protein, cell differential, HSV, sarcoid, fungal culture, CMV, EBV, VDRL, oligoclonal bands in CSF IgG index were all unremarkable Labs were all unremarkable including IgG and IgM CMV antibodies, sed rate, ANA with reflex, ACE, CRP, SPEP/IFE, ANCA all unremarkable  At this time I recommended we monitor clinically  Cc: [name] Galea, MD,  [name] [name], [name] Darlin Ehrlich  Aldona Amel, MD  [facility] [address] [place], [place] [address]  Phone  [phone] Fax [phone] I spent over 45 minutes of face-to-face and non-face-to-face time with patient on the  1. Swelling of both lower extremities   2. Dizziness      diagnosis.  This included previsit chart review, lab review, study review, order entry, electronic health record documentation, patient education on the different  diagnostic and therapeutic options, counseling and coordination of care, risks and benefits of management, compliance, or risk factor reduction

## 2023-09-25 NOTE — Telephone Encounter (Signed)
 [name]: Can you call patient, I called [facility] and he actually has to be scheduled for the ultrasounds he can;t just walk in like I thought. I spoke to [facility] and they will call to schedule. Mind calling him to let him know?  thank you !! (Pod 4 fyi)

## 2023-09-26 ENCOUNTER — Encounter: Payer: Self-pay | Admitting: Neurology

## 2023-09-26 ENCOUNTER — Ambulatory Visit: Admitting: Physical Therapy

## 2023-09-27 ENCOUNTER — Encounter: Payer: Self-pay | Admitting: Neurology

## 2023-09-27 ENCOUNTER — Ambulatory Visit
Admission: RE | Admit: 2023-09-27 | Discharge: 2023-09-27 | Discharge status: Home / Self Care | Source: Ambulatory Visit | Attending: Neurology

## 2023-09-27 ENCOUNTER — Telehealth: Payer: Self-pay | Admitting: Neurology

## 2023-09-27 DIAGNOSIS — M7989 Other specified soft tissue disorders: Secondary | ICD-10-CM

## 2023-09-27 DIAGNOSIS — M79662 Pain in left lower leg: Secondary | ICD-10-CM | POA: Diagnosis not present

## 2023-09-27 DIAGNOSIS — M79661 Pain in right lower leg: Secondary | ICD-10-CM | POA: Diagnosis not present

## 2023-09-27 NOTE — Telephone Encounter (Signed)
 Received a call from [facility] with a call report for the pt.   "IMPRESSION: No evidence of deep venous thrombosis in either lower extremity.   Complex vascular abnormality seen in right popliteal fossa with possible arterial waveforms. It is uncertain if this represents neoplasm or possibly vascular abnormality such as AVM or pseudoaneurysm. Further evaluation with CT or MRI with contrast is recommended. These results will be called to the ordering clinician or representative by the Radiologist Assistant, and communication documented in the PACS or zVision Dashboard."  Report is uploaded into epic. Will pass information along to Dr Tresia Fruit for the pt

## 2023-09-29 ENCOUNTER — Encounter: Payer: Self-pay | Admitting: Neurology

## 2023-09-29 ENCOUNTER — Telehealth: Payer: Self-pay | Admitting: Family Medicine

## 2023-09-29 ENCOUNTER — Ambulatory Visit (INDEPENDENT_AMBULATORY_CARE_PROVIDER_SITE_OTHER)

## 2023-09-29 DIAGNOSIS — I442 Atrioventricular block, complete: Secondary | ICD-10-CM | POA: Diagnosis not present

## 2023-09-29 DIAGNOSIS — I999 Unspecified disorder of circulatory system: Secondary | ICD-10-CM

## 2023-09-29 LAB — CUP PACEART REMOTE DEVICE CHECK
Battery Remaining Longevity: 160 mo
Battery Voltage: 3.16 V
Brady Statistic RV Percent Paced: 84.24 %
Date Time Interrogation Session: 20250606030223
Implantable Lead Connection Status: 753985
Implantable Lead Implant Date: 20241209
Implantable Lead Location: 753860
Implantable Lead Model: 5076
Implantable Pulse Generator Implant Date: 20241209
Lead Channel Impedance Value: 380 Ohm
Lead Channel Impedance Value: 513 Ohm
Lead Channel Pacing Threshold Amplitude: 0.5 V
Lead Channel Pacing Threshold Pulse Width: 0.4 ms
Lead Channel Sensing Intrinsic Amplitude: 14.375 mV
Lead Channel Sensing Intrinsic Amplitude: 14.375 mV
Lead Channel Setting Pacing Amplitude: 2 V
Lead Channel Setting Pacing Pulse Width: 0.4 ms
Lead Channel Setting Sensing Sensitivity: 2 mV
Zone Setting Status: 755011

## 2023-09-29 NOTE — Telephone Encounter (Signed)
 Please update patient.  I checked with Dr. Marrie Sizer.  Had talked with Dr. Quince Bryant.   Needs CTA with [facility] ahead of time (to eval the vascular finding on u/s).  I put in the order for both.  Please schedule lab visit.  He should get a call about scheduling CTA.   Thanks.   Routed to Dr. Tresia Fruit as [name] [name].

## 2023-10-02 ENCOUNTER — Telehealth: Payer: Self-pay | Admitting: Neurology

## 2023-10-02 NOTE — Telephone Encounter (Signed)
 Patient notified that he will be contaccted to schedule CTA and that he will need labs prior. I will follow up as requested with patient to schedule lab appointment

## 2023-10-02 NOTE — Telephone Encounter (Signed)
 Referral to ENT Faxed to [facility] to see Dr. Augustina [facility] Phone# [phone] Faxed # [phone]

## 2023-10-03 NOTE — Progress Notes (Signed)
 This encounter was created in error - please disregard.

## 2023-10-04 ENCOUNTER — Ambulatory Visit: Admitting: Pulmonary Disease

## 2023-10-04 ENCOUNTER — Ambulatory Visit: Attending: Pulmonary Disease | Admitting: Pulmonary Disease

## 2023-10-04 ENCOUNTER — Encounter: Payer: Self-pay | Admitting: Pulmonary Disease

## 2023-10-04 VITALS — BP 164/78 | HR 69 | Ht 70.0 in | Wt 194.0 lb

## 2023-10-04 DIAGNOSIS — I10 Essential (primary) hypertension: Secondary | ICD-10-CM | POA: Diagnosis not present

## 2023-10-04 DIAGNOSIS — I350 Nonrheumatic aortic (valve) stenosis: Secondary | ICD-10-CM | POA: Diagnosis not present

## 2023-10-04 DIAGNOSIS — I4821 Permanent atrial fibrillation: Secondary | ICD-10-CM | POA: Diagnosis not present

## 2023-10-04 DIAGNOSIS — I2510 Atherosclerotic heart disease of native coronary artery without angina pectoris: Secondary | ICD-10-CM | POA: Diagnosis not present

## 2023-10-04 DIAGNOSIS — I442 Atrioventricular block, complete: Secondary | ICD-10-CM

## 2023-10-04 DIAGNOSIS — I493 Ventricular premature depolarization: Secondary | ICD-10-CM | POA: Diagnosis not present

## 2023-10-04 DIAGNOSIS — Z952 Presence of prosthetic heart valve: Secondary | ICD-10-CM | POA: Diagnosis not present

## 2023-10-04 MED ORDER — HYDROCHLOROTHIAZIDE 25 MG PO TABS: 25.0000 mg | ORAL_TABLET | Freq: Every day | ORAL | 3 refills | Status: DC

## 2023-10-04 NOTE — Patient Instructions (Signed)
 Medication Instructions:  Take hydrochlorothiazide  25 mg daily. *If you need a refill on your cardiac medications before your next appointment, please call your pharmacy*  Lab Work: None ordered If you have labs (blood work) drawn today and your tests are completely normal, you will receive your results only by: MyChart Message (if you have MyChart) OR A paper copy in the mail If you have any lab test that is abnormal or we need to change your treatment, we will call you to review the results.  Follow-Up: At [facility], you and your health needs are our priority.  As part of our continuing mission to provide you with exceptional heart care, our providers are all part of one team.  This team includes your primary Cardiologist (physician) and Advanced Practice Providers or APPs (Physician Assistants and Nurse Practitioners) who all work together to provide you with the care you need, when you need it.  Your next appointment:   2 week(s)  Provider:   Creighton Doffing, NP

## 2023-10-04 NOTE — Progress Notes (Signed)
 Electrophysiology Office Note:   Date:  10/04/2023  ID:  [name] [name], DOB Sep 27, 1934, MRN [id number]  Primary Cardiologist: None Primary Heart Failure: None Electrophysiologist: Will Cortland Ding, MD       History of Present Illness:   [name] [name] is a 89 y.o. male with h/o AF, VT - RVOT, AS s/p TAVR 10/2022 > LBBB/RBBB s/p PPM, CAD s/p CABG, micturition syncope, CVA, carotid disease s/p CEA seen today for acute visit due to swelling.    Patient reports he has been working through an ongoing evaluation for dizziness over the past 6 months approximately.  All of his blood pressure medications were stopped as well as diuretics.  The patient reports no change in his dizziness off all of his blood pressure agents.  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  His son and daughter-in-law accompany him at visit today.  They are actively looking for facilities for placement.  The patient is asking to go back on his hydrochlorothiazide  as his lower extremities have been quite swollen and painful.  He reports that the pain is particularly in his feet and toes due to swelling.  In addition he thinks that he has orthopedic issues with his knees.  He denies chest pain, palpitations, PND, orthopnea, nausea, vomiting, dizziness, syncope, or early satiety.   Review of systems complete and found to be negative unless listed in HPI.   EP Information / Studies Reviewed:    EKG is not ordered today. EKG from 07/10/23 reviewed which showed VP with PVC's 68 bpm   EKG Interpretation Date/Time:  Wednesday October 04 2023 13:43:53 EDT Ventricular Rate:  60 PR Interval:    QRS Duration:  178 QT Interval:  540 QTC Calculation: 540 R Axis:   -76  Text Interpretation: Ventricular-paced rhythm with occasional Premature ventricular complexes Confirmed by Creighton Doffing ([id number]) on 10/04/2023 1:50:59 PM   PPM Interrogation-  reviewed in detail  today,  See PACEART report.  Device History: Medtronic Single Chamber PPM implanted 04/03/23 for alternating bundle branch block post TAVR  Studies:  [facility] 10/14/22 > severe native CAD with patent bypass grafts  ECHO 11/2022 > LVEF 60-65%, AS mean gradient 8   Arrhythmia / AAD AF  RVOT VT    Risk Assessment/Calculations:    CHA2DS2-VASc Score = 6   This indicates a 9.7% annual risk of stroke. The patient's score is based upon: CHF History: 0 HTN History: 1 Diabetes History: 0 Stroke History: 2 Vascular Disease History: 1 Age Score: 2 Gender Score: 0         Physical Exam:   VS:  BP (!) 164/78   Pulse 69   Ht 5' 10 (1.778 m)   Wt 194 lb (88 kg)   SpO2 96%   BMI 27.84 kg/m    Wt Readings from Last 3 Encounters:  10/04/23 194 lb (88 kg)  09/25/23 188 lb (85.3 kg)  08/21/23 187 lb (84.8 kg)     GEN: Well nourished, well developed in no acute distress NECK: No JVD; No carotid bruits CARDIAC: Regular rate and rhythm, SEM, rubs, gallops RESPIRATORY:  Clear to auscultation without rales, wheezing or rhonchi  ABDOMEN: Soft, non-tender, non-distended EXTREMITIES:  LE 1-2+edema; No deformity   ASSESSMENT AND PLAN:    LE Swelling  -resume hydrochlorothiazide  25 mg daily -patient instructed to wear lower extremity compression stockings and elevate legs is much as he is able  Alternating Bundle  Branch Block post TAVR s/p Medtronic PPM  -Normal PPM function -See Pace Art report -No changes today -no arrhythmias noted on device that would explain dizziness  Permanent Atrial Fibrillation  CHA2DS2-VASc 6 -continue OAC for stroke prophylaxis   Secondary Hypercoagulable State  -continue Eliquis  5mg  BID, dose reviewed and appropriate by wt / Cr   PVC's  RVOT VT  -monitor / asymptomatic PVC's   Hypertension  -not on agents, BP elevated in clinic  -hydrochlorothiazide  added back, follow BP closely  -plan to see back in 2 weeks to 1 month and recheck labs / assess  LE's  CAD s/p CABG  AS s/p TAVR -no anginal symptoms  -prior echo with normal functioning AV, repeat pending with structural heart   -per Cardiology   Dizziness  -ongoing workup with PCP, most recently has been referred to ENT   Disposition:   Follow up with EP APP 2 weeks to 1 month   Signed, Creighton Doffing, NP-C, AGACNP-BC [facility] - Electrophysiology  10/04/2023, 6:03 PM

## 2023-10-05 ENCOUNTER — Ambulatory Visit: Attending: Neurology | Admitting: Physical Therapy

## 2023-10-05 NOTE — Telephone Encounter (Signed)
 Spoke with pt, he was restarted on hydrochlorothiazide  yesterday. He reports he took it this morning and has been extremely dizzy all day. He reports he has had dizziness for sometime and no one has been able to figure out why. The dizziness got worse after taking the hydrochlorothiazide . The patient has not checked his blood pressure and encouraged him to check his blood pressure sitting, standing and standing for 3 minutes. He will let us  know if there is a difference or drop in blood pressure. He will continue to monitor and let us  know if the dizziness continues to be worse.

## 2023-10-09 ENCOUNTER — Telehealth: Payer: Self-pay | Admitting: Family Medicine

## 2023-10-09 ENCOUNTER — Ambulatory Visit: Payer: Self-pay | Admitting: Cardiology

## 2023-10-09 NOTE — Telephone Encounter (Signed)
 Patient is scheduled for CT 6/17 and will have lab work done at that time.

## 2023-10-09 NOTE — Telephone Encounter (Signed)
 Please update patient.  He can get Cr checked at the radiology appointment.  I checked with them about this.  Thanks.

## 2023-10-09 NOTE — Telephone Encounter (Signed)
 Patient notified

## 2023-10-10 ENCOUNTER — Ambulatory Visit
Admission: RE | Admit: 2023-10-10 | Discharge: 2023-10-10 | Disposition: A | Discharge status: Home / Self Care | Source: Ambulatory Visit | Attending: Family Medicine | Admitting: Family Medicine

## 2023-10-10 ENCOUNTER — Other Ambulatory Visit: Payer: Self-pay | Admitting: Family Medicine

## 2023-10-10 DIAGNOSIS — I999 Unspecified disorder of circulatory system: Secondary | ICD-10-CM

## 2023-10-10 DIAGNOSIS — I70203 Unspecified atherosclerosis of native arteries of extremities, bilateral legs: Secondary | ICD-10-CM | POA: Diagnosis not present

## 2023-10-10 DIAGNOSIS — I700 Atherosclerosis of aorta: Secondary | ICD-10-CM | POA: Diagnosis not present

## 2023-10-10 MED ORDER — IOPAMIDOL (ISOVUE-370) INJECTION 76%
100.0000 mL | Freq: Once | INTRAVENOUS | Status: AC | PRN
  Administered 2023-10-10: 100 mL via INTRAVENOUS

## 2023-10-13 ENCOUNTER — Encounter: Payer: Self-pay | Admitting: Family Medicine

## 2023-10-16 ENCOUNTER — Ambulatory Visit: Payer: Self-pay | Admitting: Family Medicine

## 2023-10-16 DIAGNOSIS — R42 Dizziness and giddiness: Secondary | ICD-10-CM | POA: Diagnosis not present

## 2023-10-20 DIAGNOSIS — M25561 Pain in right knee: Secondary | ICD-10-CM | POA: Diagnosis not present

## 2023-10-25 ENCOUNTER — Encounter: Admitting: Pulmonary Disease

## 2023-10-26 ENCOUNTER — Ambulatory Visit (HOSPITAL_COMMUNITY)
Admission: RE | Admit: 2023-10-26 | Discharge: 2023-10-26 | Disposition: A | Discharge status: Home / Self Care | Source: Ambulatory Visit | Attending: Cardiology | Admitting: Cardiology

## 2023-10-26 DIAGNOSIS — Z952 Presence of prosthetic heart valve: Secondary | ICD-10-CM | POA: Diagnosis not present

## 2023-10-26 LAB — ECHOCARDIOGRAM COMPLETE
AR max vel: 2.53 cm2
AV Area VTI: 2.54 cm2
AV Area mean vel: 2.7 cm2
AV Mean grad: 13 mmHg
AV Peak grad: 22.3 mmHg
Ao pk vel: 2.36 m/s
Area-P 1/2: 3.13 cm2
P 1/2 time: 490 ms
S' Lateral: 3.4 cm

## 2023-10-27 ENCOUNTER — Ambulatory Visit: Payer: Self-pay | Admitting: Physician Assistant

## 2023-10-28 DIAGNOSIS — M25562 Pain in left knee: Secondary | ICD-10-CM | POA: Diagnosis not present

## 2023-10-30 ENCOUNTER — Ambulatory Visit: Attending: Physician Assistant | Admitting: Physician Assistant

## 2023-10-30 ENCOUNTER — Telehealth: Payer: Self-pay

## 2023-10-30 DIAGNOSIS — I4891 Unspecified atrial fibrillation: Secondary | ICD-10-CM | POA: Diagnosis not present

## 2023-10-30 DIAGNOSIS — I442 Atrioventricular block, complete: Secondary | ICD-10-CM | POA: Diagnosis not present

## 2023-10-30 DIAGNOSIS — I350 Nonrheumatic aortic (valve) stenosis: Secondary | ICD-10-CM | POA: Diagnosis not present

## 2023-10-30 DIAGNOSIS — Z950 Presence of cardiac pacemaker: Secondary | ICD-10-CM | POA: Diagnosis not present

## 2023-10-30 DIAGNOSIS — Z952 Presence of prosthetic heart valve: Secondary | ICD-10-CM | POA: Insufficient documentation

## 2023-10-30 DIAGNOSIS — I450 Right fascicular block: Secondary | ICD-10-CM

## 2023-10-30 DIAGNOSIS — I4510 Unspecified right bundle-branch block: Secondary | ICD-10-CM | POA: Diagnosis not present

## 2023-10-30 NOTE — Progress Notes (Unsigned)
 [facility] VASCULAR [facility]ENTER   MULTIDISCIPLINARY HEART VALVE TEAM   Virtual Visit via Telephone Note   Because of [name] [name]'s co-morbid illnesses, he is at least at moderate risk for complications without adequate follow up.  This format is felt to be most appropriate for this patient at this time.  The patient did not have access to video technology/had technical difficulties with video requiring transitioning to audio format only (telephone).  All issues noted in this document were discussed and addressed.  No physical exam could be performed with this format.  Please refer to the patient's chart for his consent to telehealth for [facility].    Date:  10/30/2023   ID:  [name] [name], DOB 1934-08-30, MRN [id number] The patient was identified using 2 identifiers.  Patient Location: Home Provider Location: Office/Clinic   PCP:  Cleatus Arlyss RAMAN, MD   [facility] Providers Cardiologist:  Oneil Parchment, MD Electrophysiologist:  Will Gladis Norton, MD { Click to update primary MD,subspecialty MD or APP then REFRESH:1}    Evaluation Performed:  Follow-Up Visit  Chief Complaint:  1 year s/p TAVR  History of Present Illness:    [name] [name] is a 89 y.o. male with CAD s/p CABG (2004 EBG), HTN, carotid artery disease s/p CEA (06/2020), ventricular tachycardia, permanent atrial fibrillation on Eliquis , stroke, ongoing lifestyle limiting dizziness, severe aortic stenosis s/p TAVR (11/15/22), LBBB/RBBB s/p PPM (03/31/23) who presents via virtual medicine for follow up.   He underwent TAVR with a 29 mm Edwards Sapien 3 Ultra Resilia THV via the TF approach on 11/15/22. Post operative showed EF 60%, normally functioning TAVR with a mean gradient of 5 mmHg and no PVL. He had an underlying RBBB preop and developed CHB and was pacer dependant post valve deployment. He then returned to atrial fibrillation with new LBBB. Pt was seen by EP and offered PPM placement but  he ultimately declined. He later underwent pacer placement on 03/31/23. He has had ongoing issues with debilitating dizziness and been seen by multiple specialists.      Past Medical History:  Diagnosis Date   Arthritis    Atrial fibrillation, permanent (HCC)    Bladder neoplasm    Chronic cough    NO CARDIAC OR PULMONARY RELATED   Coronary artery disease CARDIOLOGIST-  DR KLEIN/ [facility]   a. s/p CABG 2004, b. LHC with patent grafts 07/2005, ejection fraction 50%;  c. 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 Amiodarone  Rx   S/P CABG x 5 2004   S/P TAVR (transcatheter aortic valve replacement) 11/15/2022   s/p TAVR with a 29mm Edwards S3UR via the TF approach by Dr. Wonda & Maryjane.   Severe aortic stenosis    Past Surgical History:  Procedure Laterality Date   CARDIAC CATHETERIZATION  07-26-2005  DR GAMBLE   PRESERVED LVF/  EF 50%/ PATENT GRAFTS   CARDIAC CATHETERIZATION  03-04-2003  DR GAMBLE   SEVERE 3 VESSEL DISEASE   CARDIAC CATHETERIZATION  1991  DR MAYE   PAF/ FALSE POSITIVE STRESS TEST   CAROTID ENDARTERECTOMY Left  2022   CATARACT EXTRACTION W/ INTRAOCULAR LENS IMPLANT Right    CORONARY ARTERY BYPASS GRAFT  03-08-2003  DR HZMYJMIU   LIMA TO LAD/ SVG TO OM2/  SVG TO DIAGONAL / SVG TO PDA & PLA   CYSTOSCOPY WITH BIOPSY N/A 12/25/2012   Procedure: CYSTOSCOPY WITH BLADDER BIOPSY;  Surgeon: Donnice Gwenyth Brooks, MD;  Location: [facility];  Service: Urology;  Laterality: N/A;   ELECTROPHYSIOLOGY STUDY  07-27-2005  DR DANELLE BIRMINGHAM   MAPPING --   RESULT NONINDUCIBLE VT OR SVT/  DX RIGHT VENTRICULAR OUTFLOW TRACT  VT AND RULES OUT MORE MALIGNANT CAUSES OF VT   ENDARTERECTOMY Left 07/08/2020   Procedure: LEFT CAROTID ENDARTERECTOMY;  Surgeon: Serene Gaile ORN, MD;  Location: [facility] OR;  Service: Vascular;  Laterality: Left;   INGUINAL HERNIA REPAIR Bilateral 1954  &  1990   INTRAOPERATIVE TRANSTHORACIC ECHOCARDIOGRAM N/A 11/15/2022   Procedure: INTRAOPERATIVE TRANSTHORACIC ECHOCARDIOGRAM;  Surgeon: Wonda Sharper, MD;  Location: [facility] INVASIVE CV LAB;  Service: Open Heart Surgery;  Laterality: N/A;   KNEE ARTHROSCOPY Right 1994   LEFT HEART CATH AND CORS/GRAFTS ANGIOGRAPHY N/A 10/14/2022   Procedure: LEFT HEART CATH AND CORS/GRAFTS ANGIOGRAPHY;  Surgeon: Wonda Sharper, MD;  Location: [facility] INVASIVE CV LAB;  Service: Cardiovascular;  Laterality: N/A;   LUMBAR DISC SURGERY  1999   L4 -- L5   MOHS SURGERY     by Dr. Lyle 2019   PACEMAKER IMPLANT N/A 04/03/2023   Procedure: PACEMAKER IMPLANT;  Surgeon: Fernande Elspeth BROCKS, MD;  Location: [facility] INVASIVE CV LAB;  Service: Cardiovascular;  Laterality: N/A;   REMOVAL VOCAL CORD POLYPS  1978   TONSILLECTOMY  AS CHILD   TRANSCATHETER AORTIC VALVE REPLACEMENT, TRANSFEMORAL N/A 11/15/2022   Procedure: Transcatheter Aortic Valve Replacement, Transfemoral;  Surgeon: Wonda Sharper, MD;  Location: [facility] INVASIVE CV LAB;  Service: Open Heart Surgery;  Laterality: N/A;   TRANSTHORACIC ECHOCARDIOGRAM  08/08/2011   MILD LVH/  EF 55-60%/  GRADE I DIASTOLIC DYSFUNCTION/ MILD AV STENOSIS     No outpatient medications have been marked as taking for the 10/30/23 encounter (Appointment) with Sebastian Lamarr SAUNDERS, PA-C.     Allergies:   Hydralazine  hcl, Ace inhibitors, Zocor [simvastatin], Nsaids, Prednisone , Cephalexin, Chocolate, Doxycycline , Gabapentin , Hycodan [hydrocodone  bit-homatrop mbr], and Oxycodone    Social History   Tobacco Use   Smoking status: Former    Current packs/day: 0.00    Average packs/day: 1 pack/day for 25.0 years (25.0 ttl pk-yrs)    Types: Cigarettes    Start  date: 04/26/1955    Quit date: 04/25/1980    Years since quitting: 43.5   Smokeless tobacco: Never  Vaping Use   Vaping status: Never Used  Substance Use Topics   Alcohol use: No    Alcohol/week: 0.0 standard drinks of alcohol   Drug use: No     Family Hx: The patient's family history includes Cancer in his mother. There is no history of Stroke.  ROS:   Please see the history of present illness.    *** All other systems reviewed and are negative.   Prior CV studies:   The following studies were reviewed today:  ***  Labs/Other Tests and Data Reviewed:    EKG:  {EKG/Telemetry Strips Reviewed:2722894101}  Recent Labs: 01/09/2023: ALT 15 03/26/2023: Magnesium  2.1; TSH 2.546 04/18/2023: Hemoglobin 10.9; Platelets 191.0 06/23/2023: BUN 24; Creatinine, Ser 0.79; Potassium 4.4; Sodium 139   Recent Lipid Panel Lab Results  Component Value Date/Time   CHOL 171 01/12/2023  04:31 PM   CHOL 158 12/20/2017 12:00 AM   TRIG 73 01/12/2023 04:31 PM   TRIG 95 12/20/2017 12:00 AM   HDL 68 01/12/2023 04:31 PM   CHOLHDL 2.5 01/12/2023 04:31 PM   CHOLHDL 2 11/23/2020 12:22 PM   LDLCALC 89 01/12/2023 04:31 PM   LDLCALC 74 12/20/2017 12:00 AM   LDLDIRECT 88 07/27/2020 11:44 AM    Wt Readings from Last 3 Encounters:  10/04/23 194 lb (88 kg)  09/25/23 188 lb (85.3 kg)  08/21/23 187 lb (84.8 kg)     Risk Assessment/Calculations:   {Does this patient have ATRIAL FIBRILLATION?:[id number]}      Objective:    Vital Signs:  There were no vitals taken for this visit.    ASSESSMENT & PLAN:    ***      {Are you ordering a CV Procedure (e.g. stress test, cath, DCCV, TEE, etc)?   Press F2        :[id number]}     Time:   Today, I have spent *** minutes with the patient with telehealth technology discussing the above problems.     Medication Adjustments/Labs and Tests Ordered: Current medicines are reviewed at length with the patient today.  Concerns regarding medicines are outlined  above.   Tests Ordered: No orders of the defined types were placed in this encounter.   Medication Changes: No orders of the defined types were placed in this encounter.   Follow Up:  {F/U Format:[phone]} {follow up:15908}  Signed, Lamarr Hummer, PA-C  10/30/2023 11:13 AM    [facility]

## 2023-10-30 NOTE — Telephone Encounter (Signed)
  Patient Consent for Virtual Visit        [name] [name] has provided verbal consent on 10/30/2023 for a virtual visit (video or telephone).   CONSENT FOR VIRTUAL VISIT FOR:  [name] [name]  By participating in this virtual visit I agree to the following:  I hereby voluntarily request, consent and authorize [facility] and its employed or contracted physicians, physician assistants, nurse practitioners or other licensed health care professionals (the Practitioner), to provide me with telemedicine health care services (the "Services) as deemed necessary by the treating Practitioner. I acknowledge and consent to receive the Services by the Practitioner via telemedicine. I understand that the telemedicine visit will involve communicating with the Practitioner through live audiovisual communication technology and the disclosure of certain medical information by electronic transmission. I acknowledge that I have been given the opportunity to request an in-person assessment or other available alternative prior to the telemedicine visit and am voluntarily participating in the telemedicine visit.  I understand that I have the right to withhold or withdraw my consent to the use of telemedicine in the course of my care at any time, without affecting my right to future care or treatment, and that the Practitioner or I may terminate the telemedicine visit at any time. I understand that I have the right to inspect all information obtained and/or recorded in the course of the telemedicine visit and may receive copies of available information for a reasonable fee.  I understand that some of the potential risks of receiving the Services via telemedicine include:  Delay or interruption in medical evaluation due to technological equipment failure or disruption; Information transmitted may not be sufficient (e.g. poor resolution of images) to allow for appropriate medical decision making by the  Practitioner; and/or  In rare instances, security protocols could fail, causing a breach of personal health information.  Furthermore, I acknowledge that it is my responsibility to provide information about my medical history, conditions and care that is complete and accurate to the best of my ability. I acknowledge that Practitioner's advice, recommendations, and/or decision may be based on factors not within their control, such as incomplete or inaccurate data provided by me or distortions of diagnostic images or specimens that may result from electronic transmissions. I understand that the practice of medicine is not an exact science and that Practitioner makes no warranties or guarantees regarding treatment outcomes. I acknowledge that a copy of this consent can be made available to me via my patient portal [facility] MyChart), or I can request a printed copy by calling the office of [facility].    I understand that my insurance will be billed for this visit.   I have read or had this consent read to me. I understand the contents of this consent, which adequately explains the benefits and risks of the Services being provided via telemedicine.  I have been provided ample opportunity to ask questions regarding this consent and the Services and have had my questions answered to my satisfaction. I give my informed consent for the services to be provided through the use of telemedicine in my medical care

## 2023-10-31 ENCOUNTER — Ambulatory Visit: Payer: Self-pay | Admitting: Family Medicine

## 2023-10-31 ENCOUNTER — Other Ambulatory Visit: Payer: Self-pay

## 2023-10-31 ENCOUNTER — Ambulatory Visit

## 2023-10-31 ENCOUNTER — Ambulatory Visit
Admission: EM | Admit: 2023-10-31 | Discharge: 2023-10-31 | Disposition: A | Discharge status: Home / Self Care | Attending: Emergency Medicine | Admitting: Emergency Medicine

## 2023-10-31 ENCOUNTER — Encounter: Payer: Self-pay | Admitting: Emergency Medicine

## 2023-10-31 DIAGNOSIS — M19011 Primary osteoarthritis, right shoulder: Secondary | ICD-10-CM | POA: Diagnosis not present

## 2023-10-31 DIAGNOSIS — M25511 Pain in right shoulder: Secondary | ICD-10-CM | POA: Diagnosis not present

## 2023-10-31 DIAGNOSIS — W19XXXA Unspecified fall, initial encounter: Secondary | ICD-10-CM | POA: Diagnosis not present

## 2023-10-31 DIAGNOSIS — S4991XA Unspecified injury of right shoulder and upper arm, initial encounter: Secondary | ICD-10-CM | POA: Diagnosis not present

## 2023-10-31 NOTE — ED Triage Notes (Signed)
 Pt here for several dizzy spells today with hx of similar; pt sts fall today from dizziness landing on right shoulder; denies hitting head; unsure of LOC; alert per norm at present

## 2023-10-31 NOTE — ED Provider Notes (Signed)
 [facility]    CSN: [id number] Arrival date & time: 10/31/23  1724      History   Chief Complaint Chief Complaint  Patient presents with   Fall    HPI [name] [name] is a 89 y.o. male.   Patient presents for evaluation of of the right shoulder due to fall that occurred today.  It was walking within the home using assistive walker when he became dizzy described as feeling off balance, lost balance and fell forward, denies hitting his head and does not believe that he lost consciousness but not fully sure.  Has full range of motion of the arm, only endorses minimal pain, would like to ensure the area has not been injured.  Has not attempted treatment.  Endorses has experienced intermittent persisting dizziness for about 8 months to a year, has been evaluated and is currently being followed by ENT, PCP, neurology and cardiology.  His episode of dizziness was similar in presentation but has occurred 3 times today which is out of the norm.    Past Medical History:  Diagnosis Date   Arthritis    Atrial fibrillation, permanent (HCC)    Bladder neoplasm    Chronic cough    NO CARDIAC OR PULMONARY RELATED   Coronary artery disease CARDIOLOGIST-  DR KLEIN/ [facility]   a. s/p CABG 2004, b. LHC with patent grafts 07/2005, ejection fraction 50%;  c. 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 Amiodarone  Rx   S/P CABG x 5 2004   S/P TAVR (transcatheter aortic valve replacement) 11/15/2022   s/p TAVR with a 29mm Edwards S3UR via the TF approach by Dr. Wonda & Maryjane.   Severe aortic stenosis     Patient Active Problem List    Diagnosis Date Noted   Sinus pressure 06/25/2023   History of CVA in adulthood 01/23/2023   Pain of lower extremity 01/23/2023   RBBB 11/15/2022   S/P TAVR (transcatheter aortic valve replacement) 11/15/2022   DNR (do not resuscitate) 08/08/2022   Permanent atrial fibrillation (HCC) 02/22/2022   Chronic pain of both knees 12/25/2021   Bilateral leg pain 09/28/2021   Advance care planning 10/28/2020   Bilateral pseudophakia 07/27/2020   Carpal tunnel syndrome 07/27/2020   Dermatochalasis 07/27/2020   Hematuria 07/27/2020   Insomnia 07/27/2020   Radiculopathy 07/27/2020   Stenosis of internal carotid artery with cerebral infarction, right (HCC) 07/08/2020   Imbalance 03/27/2020   Retinal artery occlusion 03/10/2020   Constipation 12/22/2019   Rhinitis sicca 05/22/2018   Sciatica 12/28/2017   Neuropathy 10/07/2017   Foot swelling 10/07/2017   CAD (coronary artery disease), autologous vein bypass graft 10/31/2016   Family history of colon cancer 10/31/2016   History of adenomatous polyp of colon 10/31/2016   Osteoarthritis 10/31/2016   Skin lesion 06/04/2015   Elevated TSH 12/26/2014   Severe aortic stenosis 05/16/2013   Bladder neoplasm 11/23/2012   BCC (basal cell carcinoma of skin) 08/29/2012   GERD (gastroesophageal reflux disease) 05/01/2012   Hyperlipidemia 03/14/2012   TMJ pain dysfunction syndrome 01/20/2012   Long term (current) use  of anticoagulants 08/31/2011   RVOT ventricular tachycardia (HCC)    Cough 04/29/2009   HEARING LOSS, SUDDEN 12/03/2007   HTN (hypertension) 02/22/2007   S/P CABG x 5 2004    Past Surgical History:  Procedure Laterality Date   CARDIAC CATHETERIZATION  07-26-2005  DR GAMBLE   PRESERVED LVF/  EF 50%/ PATENT GRAFTS   CARDIAC CATHETERIZATION  03-04-2003  DR GAMBLE   SEVERE 3 VESSEL DISEASE   CARDIAC CATHETERIZATION  1991  DR [facility]   PAF/ FALSE POSITIVE STRESS TEST   CAROTID ENDARTERECTOMY Left 2022   CATARACT EXTRACTION W/  INTRAOCULAR LENS IMPLANT Right    CORONARY ARTERY BYPASS GRAFT  03-08-2003  DR HZMYJMIU   LIMA TO LAD/ SVG TO OM2/  SVG TO DIAGONAL / SVG TO PDA & PLA   CYSTOSCOPY WITH BIOPSY N/A 12/25/2012   Procedure: CYSTOSCOPY WITH BLADDER BIOPSY;  Surgeon: Donnice Gwenyth Brooks, MD;  Location: [facility];  Service: Urology;  Laterality: N/A;   ELECTROPHYSIOLOGY STUDY  07-27-2005  DR DANELLE [place]ING[place]   MAPPING --   RESULT NONINDUCIBLE VT OR SVT/  DX RIGHT VENTRICULAR OUTFLOW TRACT VT AND RULES OUT MORE MALIGNANT CAUSES OF VT   ENDARTERECTOMY Left 07/08/2020   Procedure: LEFT CAROTID ENDARTERECTOMY;  Surgeon: Serene Gaile ORN, MD;  Location: [facility] OR;  Service: Vascular;  Laterality: Left;   INGUINAL HERNIA REPAIR Bilateral 1954  &  1990   INTRAOPERATIVE TRANSTHORACIC ECHOCARDIOGRAM N/A 11/15/2022   Procedure: INTRAOPERATIVE TRANSTHORACIC ECHOCARDIOGRAM;  Surgeon: Wonda Sharper, MD;  Location: [facility] INVASIVE CV LAB;  Service: Open Heart Surgery;  Laterality: N/A;   KNEE ARTHROSCOPY Right 1994   LEFT HEART CATH AND CORS/GRAFTS ANGIOGRAPHY N/A 10/14/2022   Procedure: LEFT HEART CATH AND CORS/GRAFTS ANGIOGRAPHY;  Surgeon: Wonda Sharper, MD;  Location: [facility] INVASIVE CV LAB;  Service: Cardiovascular;  Laterality: N/A;   LUMBAR DISC SURGERY  1999   L4 -- L5   MOHS SURGERY     by Dr. Lyle 2019   PACEMAKER IMPLANT N/A 04/03/2023   Procedure: PACEMAKER IMPLANT;  Surgeon: Fernande Elspeth BROCKS, MD;  Location: [facility] INVASIVE CV LAB;  Service: Cardiovascular;  Laterality: N/A;   REMOVAL VOCAL CORD POLYPS  1978   TONSILLECTOMY  AS CHILD   TRANSCATHETER AORTIC VALVE REPLACEMENT, TRANSFEMORAL N/A 11/15/2022   Procedure: Transcatheter Aortic Valve Replacement, Transfemoral;  Surgeon: Wonda Sharper, MD;  Location: [facility] INVASIVE CV LAB;  Service: Open Heart Surgery;  Laterality: N/A;   TRANSTHORACIC ECHOCARDIOGRAM  08/08/2011   MILD LVH/  EF 55-60%/  GRADE I DIASTOLIC DYSFUNCTION/ MILD AV STENOSIS       Home  Medications    Prior to Admission medications   Medication Sig Start Date End Date Taking? Authorizing Provider  acetaminophen  (TYLENOL ) 500 MG tablet Take 500-1,000 mg by mouth every 6 (six) hours as needed (pain.).    [provider]  apixaban  (ELIQUIS ) 5 MG TABS tablet Take 1 tablet (5 mg total) by mouth 2 (two) times daily. Take evening dose starting 07/09/20 07/09/20   Baglia, Corrina, PA-C  Blood Pressure Monitoring (B-D ASSURE BPM/AUTO ARM CUFF) MISC Use check to BP.  Omron cuff if possible.  Dx. HTN 06/23/23   Cleatus Arlyss RAMAN, MD  cetirizine  (ZYRTEC ) 10 MG tablet Take 1 tablet (10 mg total) by mouth daily. 10/29/21   Cleatus Arlyss RAMAN, MD  fluticasone  (FLONASE ) 50 MCG/ACT nasal spray Place 1 spray into both nostrils daily. 08/21/23   Cleatus Arlyss RAMAN, MD  hydrochlorothiazide  (HYDRODIURIL ) 25 MG tablet  Take 1 tablet (25 mg total) by mouth daily. 10/04/23 01/02/24  Aniceto Daphne CROME, NP  Iron , Ferrous Sulfate , 325 (65 Fe) MG TABS Take 325 mg by mouth daily. 03/26/23   Cleatus Arlyss RAMAN, MD  latanoprost (XALATAN) 0.005 % ophthalmic solution Place 1 drop into both eyes at bedtime. 07/01/20   [provider]  melatonin 3 MG TABS tablet Take 3 mg by mouth at bedtime.    [provider]  Polyethyl Glycol-Propyl Glycol (SYSTANE ULTRA OP) Place 1-2 drops into both eyes once as needed (dry eyes.).    [provider]  potassium chloride  (KLOR-CON  M) 10 MEQ tablet Take 10 mEq by mouth in the morning.    [provider]  pravastatin  (PRAVACHOL ) 80 MG tablet Take 1 tablet (80 mg total) by mouth every evening. 01/01/21   Fernande Elspeth BROCKS, MD    Family History Family History  Problem Relation Age of Onset   Cancer Mother    Stroke Neg Hx     Social History Social History   Tobacco Use   Smoking status: Former    Current packs/day: 0.00    Average packs/day: 1 pack/day for 25.0 years (25.0 ttl pk-yrs)    Types: Cigarettes    Start date: 04/26/1955    Quit date:  04/25/1980    Years since quitting: 43.5   Smokeless tobacco: Never  Vaping Use   Vaping status: Never Used  Substance Use Topics   Alcohol use: No    Alcohol/week: 0.0 standard drinks of alcohol   Drug use: No     Allergies   Hydralazine  hcl, [name] inhibitors, Zocor [simvastatin], Nsaids, Prednisone , Cephalexin, Chocolate, Doxycycline , Gabapentin , Hycodan [hydrocodone  bit-homatrop mbr], and Oxycodone    Review of Systems Review of Systems   Physical Exam Triage Vital Signs ED Triage Vitals  Encounter Vitals Group     BP 10/31/23 1740 (!) 164/79     Girls Systolic BP Percentile --      Girls Diastolic BP Percentile --      Boys Systolic BP Percentile --      Boys Diastolic BP Percentile --      Pulse Rate 10/31/23 1740 69     Resp 10/31/23 1740 18     Temp 10/31/23 1740 98.8 F (37.1 C)     Temp Source 10/31/23 1740 Oral     SpO2 10/31/23 1740 95 %     Weight --      Height --      Head Circumference --      Peak Flow --      Pain Score 10/31/23 1741 2     Pain Loc --      Pain Education --      Exclude from Growth Chart --    No data found.  Updated Vital Signs BP (!) 164/79 (BP Location: Left Arm)   Pulse 69   Temp 98.8 F (37.1 C) (Oral)   Resp 18   SpO2 95%   Visual Acuity Right Eye Distance:   Left Eye Distance:   Bilateral Distance:    Right Eye Near:   Left Eye Near:    Bilateral Near:     Physical Exam Constitutional:      Appearance: Normal appearance.  Eyes:     Extraocular Movements: Extraocular movements intact.  Pulmonary:     Effort: Pulmonary effort is normal.  Musculoskeletal:     Comments: Unable to reproduce tenderness, no ecchymosis swelling or deformity, has full range of  motion, strength is a 5 out of 5, 2+ brachial pulse  Neurological:     General: No focal deficit present.     Mental Status: He is alert and oriented to person, place, and time. Mental status is at baseline.     Cranial Nerves: No cranial nerve deficit.      Sensory: No sensory deficit.     Motor: No weakness.     Gait: Gait normal.      UC Treatments / Results  Labs (all labs ordered are listed, but only abnormal results are displayed) Labs Reviewed - No data to display  EKG   Radiology DG Shoulder Right Result Date: 10/31/2023 CLINICAL DATA:  Right shoulder injury. EXAM: RIGHT SHOULDER - 2+ VIEW COMPARISON:  July 21, 2017. FINDINGS: There is no evidence of fracture or dislocation. Mild degenerative changes are seen involving the right acromioclavicular and glenohumeral joints. Soft tissues are unremarkable. IMPRESSION: Mild degenerative changes as noted above. No acute abnormality seen. Electronically Signed   By: Lynwood Landy Raddle M.D.   On: 10/31/2023 18:04    Procedures Procedures (including critical care time)  Medications Ordered in UC Medications - No data to display  Initial Impression / Assessment and Plan / UC Course  I have reviewed the triage vital signs and the nursing notes.  Pertinent labs & imaging results that were available during my care of the patient were reviewed by me and considered in my medical decision making (see chart for details).  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   Teresa Shelba SAUNDERS, NP 10/31/23 [phone]

## 2023-10-31 NOTE — ED Notes (Signed)
 Unable to complete orthostatics due to pt irregular HR and inability to tolerate

## 2023-10-31 NOTE — Discharge Instructions (Addendum)
 Today for your right shoulder  EKG shows no changes from prior EKGs available in the chart  Unable to obtain orthostatic vital signs  Dizziness however is not new, continue to keep upcoming appointments with doctors for further evaluation and management  At any point if your dizziness continues to progress you may need to go to the hospital for a full and complete workup  X-ray shows that there is no injury to the bone  May take Tylenol  as needed for pain  May use ice or heat over the affected area if becomes painful  Area may be more painful or stiff in the morning, this is normal as the body becomes more stiff at nighttime  May follow-up with his urgent care as needed

## 2023-10-31 NOTE — Telephone Encounter (Signed)
 FYI Only or Action Required?: FYI only for provider.  Patient was last seen in primary care on 08/21/2023 by Cleatus Arlyss RAMAN, MD.  Called Nurse Triage reporting Fall.  Symptoms began today.  Interventions attempted: Nothing.  Symptoms are: stable.  Triage Disposition: See HCP Within 4 Hours (Or PCP Triage)  Patient/caregiver understands and will follow disposition?: Yes                         Copied from CRM [id number]. Topic: Clinical - Red Word Triage >> Oct 31, 2023  4:01 PM Taleah C wrote: Red Word that prompted transfer to Nurse Triage: dizziness, unbalanced, fell an hour ago, hit shoulder when fell. Reason for Disposition  [1] MODERATE weakness (i.e., interferes with work, school, normal activities) AND [2] new-onset or worsening  Answer Assessment - Initial Assessment Questions This RN recommends pt goes to urgent care and has another adult drive. This RN recommends if pt gets up and feels unstable to call for an ambulance to go to ED. Pt agreeable.    MECHANISM: How did the fall happen?     History of dizziness when up and moving ONSET: When did the fall happen? (e.g., minutes, hours, or days ago)     45 minutes ago LOCATION: What part of the body hit the ground? (e.g., back, buttocks, head, hips, knees, hands, head, stomach)     Did not hit head; right shoulder hit floor and is a little sore but can move arm around PAIN: Is there any pain? If Yes, ask: How bad is the pain? (e.g., Scale 1-10; or mild,  moderate, severe)   - NONE (0): No pain   - MILD (1-3): Doesn't interfere with normal activities    - MODERATE (4-7): Interferes with normal activities or awakens from sleep    - SEVERE (8-10): Excruciating pain, unable to do any normal activities      A little bit but nothing serious OTHER SYMPTOMS: Do you have any other symptoms? (e.g., dizziness, fever, weakness; new onset or worsening).      Feels unstable  Protocols used: Falls  and [facility]

## 2023-11-01 ENCOUNTER — Ambulatory Visit: Payer: Medicare Other

## 2023-11-01 ENCOUNTER — Other Ambulatory Visit (HOSPITAL_COMMUNITY): Payer: Medicare Other

## 2023-11-01 NOTE — Telephone Encounter (Signed)
 Noted. Thanks.

## 2023-11-02 ENCOUNTER — Ambulatory Visit: Admitting: Physician Assistant

## 2023-11-02 ENCOUNTER — Other Ambulatory Visit (HOSPITAL_COMMUNITY)

## 2023-11-06 DIAGNOSIS — M25562 Pain in left knee: Secondary | ICD-10-CM | POA: Diagnosis not present

## 2023-11-06 DIAGNOSIS — M25561 Pain in right knee: Secondary | ICD-10-CM | POA: Diagnosis not present

## 2023-11-10 DIAGNOSIS — H903 Sensorineural hearing loss, bilateral: Secondary | ICD-10-CM | POA: Diagnosis not present

## 2023-11-13 DIAGNOSIS — Z8673 Personal history of transient ischemic attack (TIA), and cerebral infarction without residual deficits: Secondary | ICD-10-CM | POA: Diagnosis not present

## 2023-11-13 DIAGNOSIS — H538 Other visual disturbances: Secondary | ICD-10-CM | POA: Diagnosis not present

## 2023-11-13 DIAGNOSIS — R42 Dizziness and giddiness: Secondary | ICD-10-CM | POA: Diagnosis not present

## 2023-11-13 DIAGNOSIS — Z79899 Other long term (current) drug therapy: Secondary | ICD-10-CM | POA: Diagnosis not present

## 2023-11-13 DIAGNOSIS — H903 Sensorineural hearing loss, bilateral: Secondary | ICD-10-CM | POA: Diagnosis not present

## 2023-11-13 DIAGNOSIS — Z7982 Long term (current) use of aspirin: Secondary | ICD-10-CM | POA: Diagnosis not present

## 2023-11-14 NOTE — Progress Notes (Signed)
 Remote pacemaker transmission.

## 2023-11-15 NOTE — Progress Notes (Deleted)
 Electrophysiology Office Note:   Date:  11/15/2023  ID:  [name] [name], [name] Jul 18, 1934, MRN [id number]  Primary Cardiologist: Oneil Parchment, MD Primary Heart Failure: None Electrophysiologist: Will Gladis Norton, MD   {Click to update primary MD,subspecialty MD or APP then REFRESH:1}    History of Present Illness:   [name] [name] is a 89 y.o. male with h/o AF, VT - RVOT, AS s/p TAVR 10/2022 > LBBB/RBBB s/p PPM, CAD s/p CABG, micturition syncope, CVA, carotid disease s/p CEA  seen today for routine electrophysiology followup.   Seen in clinic 10/04/23 with increased LE swelling, ongoing dizziness (6 months hx, all BP meds stopped).  Hydrochlorothiazide  was restarted due to increased LE swelling which was making his legs painful and heavy.  Seen in ER on 10/31/23 after a fall with right shoulder pain.  He was using his walker, became dizzy / off balance and fell forward. XRAY of shoulder was negative. BP was slightly elevated 164/79.  He has an upcoming vestibular clinic evaluation.   Since last being seen in our clinic the patient reports doing ***.    He denies chest pain, palpitations, dyspnea, PND, orthopnea, nausea, vomiting, dizziness, syncope, edema, weight gain, or early satiety.   Review of systems complete and found to be negative unless listed in HPI.   EP Information / Studies Reviewed:    EKG is not ordered today. EKG from 10/31/23 reviewed which showed AF, VP with PVC's 73 bpm       PPM Interrogation-  reviewed in detail today,  See PACEART report.  Device History: Medtronic Single Chamber PPM implanted 04/03/23 for alternating BBB post TAVR  Studies:  [facility] 10/14/22 > severe native CAD with patent bypass grafts  ECHO 11/2022 > LVEF 60-65%, AS mean gradient 8    Arrhythmia / AAD AF  RVOT VT   Risk Assessment/Calculations:    CHA2DS2-VASc Score = 6  {Confirm score is correct.  If not, click here to update score.  REFRESH note.  :1} This indicates a 9.7% annual  risk of stroke. The patient's score is based upon: CHF History: 0 HTN History: 1 Diabetes History: 0 Stroke History: 2 Vascular Disease History: 1 Age Score: 2 Gender Score: 0   {This patient has a significant risk of stroke if diagnosed with atrial fibrillation.  Please consider VKA or DOAC agent for anticoagulation if the bleeding risk is acceptable.   You can also use the SmartPhrase .[email or web address] for documentation.   :[id number]} No BP recorded.  {Refresh Note OR Click here to enter BP  :1}***        Physical Exam:   VS:  There were no vitals taken for this visit.   Wt Readings from Last 3 Encounters:  10/04/23 194 lb (88 kg)  09/25/23 188 lb (85.3 kg)  08/21/23 187 lb (84.8 kg)     GEN: Well nourished, well developed in no acute distress NECK: No JVD; No carotid bruits CARDIAC: {EPRHYTHM:28826}, no murmurs, rubs, gallops RESPIRATORY:  Clear to auscultation without rales, wheezing or rhonchi  ABDOMEN: Soft, non-tender, non-distended EXTREMITIES:  No edema; No deformity   ASSESSMENT AND PLAN:    LE Swelling  Dizziness  -improved on hydrochlorothiazide  ***  -compression stockings   CHB / Alternating BBB s/p TAVR s/p Medtronic PPM  -Normal PPM function -See Pace Art report -No changes today  PVC's  RVOT VT  -asymptomatic PVC's *** / monitor   Permanent Atrial Fibrillation  CHA2DS2-VASc 6 -OAC for stroke prophylaxis   Secondary  Hypercoagulable State  -continue Eliquis  5mg  BID, dose reviewed and appropriate by wt / Cr   Hypertension  -off all BP agents due dizziness, BP was elevated at last clinic visit & hydrochlorothiazide  restarted with LE edema  -BP improved ***, LE improved ***  -update labs > BMP***  CAD s/p CABG  AS s/p TAVR -no anginal symptoms  -prior ECHO with normal functioning AV, follows with structural heart    Disposition:   Follow up with Dr. Inocencio {EPFOLLOW LE:71826}  Signed, Daphne Barrack, NP-C, AGACNP-BC [facility] -  Electrophysiology  11/15/2023, 8:30 AM

## 2023-11-27 ENCOUNTER — Encounter: Admitting: Pulmonary Disease

## 2023-11-27 DIAGNOSIS — I10 Essential (primary) hypertension: Secondary | ICD-10-CM

## 2023-11-27 DIAGNOSIS — I442 Atrioventricular block, complete: Secondary | ICD-10-CM

## 2023-11-27 DIAGNOSIS — I4821 Permanent atrial fibrillation: Secondary | ICD-10-CM

## 2023-11-27 DIAGNOSIS — I493 Ventricular premature depolarization: Secondary | ICD-10-CM

## 2023-11-27 DIAGNOSIS — R600 Localized edema: Secondary | ICD-10-CM

## 2023-11-27 DIAGNOSIS — R42 Dizziness and giddiness: Secondary | ICD-10-CM

## 2023-11-27 DIAGNOSIS — D6869 Other thrombophilia: Secondary | ICD-10-CM

## 2023-12-04 ENCOUNTER — Telehealth: Payer: Self-pay | Admitting: Internal Medicine

## 2023-12-04 NOTE — Telephone Encounter (Signed)
 Patient would like to know if his monitor can be checked. 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 His arm has been aching and in pain, and he would like to have device checked if possible. He also mentioned being able to do a virtual appointment if necessary. He says he's unable to leave his wife at this time.

## 2023-12-04 NOTE — Telephone Encounter (Signed)
 Pt has sent in the transmission

## 2023-12-04 NOTE — Telephone Encounter (Addendum)
 Patient called to advise he has some red areas on his chest but not necessarily at pacemaker site. Denies any swelling, drainage or other complaints at pacemaker site. Offered patient to send picture via mychart and someone will follow up. Patient is agreeable to plan and voiced understanding.

## 2023-12-04 NOTE — Telephone Encounter (Signed)
Attempted to contact patient. No answer, left message to call back

## 2023-12-04 NOTE — Telephone Encounter (Signed)
 Remote transmission shows normal device function.   No episodes noted. Lead trends appear stable.

## 2023-12-04 NOTE — Telephone Encounter (Signed)
 Called pt to follow up and advised per Dr. Inocencio follow up with PCP. Pt voiced understanding and appreciative of call back.

## 2023-12-04 NOTE — Telephone Encounter (Signed)
 Patient given verbal permission to attach photo in chart. Patient sent picture d/t not able to drive and come to apts. 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   Advised patient I am not certain the red areas are related to the device (possible allergy). Writer advised patient to contact his PCP in the meantime and I will send to Dr. Inocencio to weigh in.   Patient voiced understanding and agreeable to plan.

## 2023-12-05 ENCOUNTER — Ambulatory Visit: Payer: Self-pay | Admitting: *Deleted

## 2023-12-05 ENCOUNTER — Telehealth: Payer: Self-pay | Admitting: Cardiology

## 2023-12-05 NOTE — Telephone Encounter (Signed)
 Patient wants to know if he should still keep his appointment on 8/27 or if his telephone encounter on 8/11 would suffice for his follow-up.

## 2023-12-05 NOTE — Telephone Encounter (Signed)
 Patient scheduled for virtual visit

## 2023-12-05 NOTE — Telephone Encounter (Signed)
 If he can't come in, then virtual is likely the next best option.  Please schedule when possible.  Thanks.

## 2023-12-05 NOTE — Telephone Encounter (Signed)
 Please call patient to scheduled for an office visit or virtual visit. Thank you

## 2023-12-05 NOTE — Telephone Encounter (Signed)
 Sch virtual visit for thurs, 8/14 @ 11am. Pt states he may need additional assistance with virtual video, he's had a virtual appt before but needed help logging onto visit. Pt states due to his health, he's unable to drive to office for an in person visit.

## 2023-12-05 NOTE — Telephone Encounter (Signed)
 FYI Only or Action Required?: Action required by provider: request for appointment.  Patient was last seen in primary care on 08/21/2023 by Cleatus Arlyss RAMAN, MD.  Called Nurse Triage reporting Urticaria (Left arm pain, neck pain).  Symptoms began several days ago.  Interventions attempted: OTC medications: Tylenol .  Symptoms are: gradually worsening.  Triage Disposition: See PCP When Office is Open (Within 3 Days)  Patient/caregiver understands and will follow disposition?: No, refuses disposition  Patient is requesting virtual visit- he did have cardiology appointment yesterday- patient feels he has pulled muscle- but not sure what the red areas are. Patient is requesting VV due to his home situation- he is caregiver for his wife and she is declining, he does not drive. Patient advised I would send request for virtual appointment- this is something the PCP would want to see as OV- but understand his situation .  Reason for Disposition  [1] MODERATE neck pain (e.g., interferes with normal activities) AND [2] present > 3 days  Answer Assessment - Initial Assessment Questions 1. ONSET: When did the pain begin?      Saturday- gotten worse 2. LOCATION: Where does it hurt?      Left neck/chest/arm sore 3. PATTERN Does the pain come and go, or has it been constant since it started?      constant 4. SEVERITY: How bad is the pain?  (Scale 0-10; or none or slight stiffness, mild, moderate, severe)     6/10 5. RADIATION: Does the pain go anywhere else, shoot into your arms?     No radiation  6. CORD SYMPTOMS: Any weakness or numbness of the arms or legs?     no 7. CAUSE: What do you think is causing the neck pain?     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 NECK OVERUSE: Any recent activities that involved turning or twisting the neck?     Lifting, moving wife in bed 9. OTHER SYMPTOMS: Do you have any other symptoms?  (e.g., headache, fever, chest pain, difficulty breathing, neck swelling)     Hives, possible swelling on left side  Patient had appointment with cardiology to check pacemaker- states everything is normal - no damage  Protocols used: Neck Pain or Stiffness-A-AH   Copied from CRM #[id number]. Topic: Clinical - Red Word Triage >> Dec 05, 2023 11:20 AM Frederich PARAS wrote: [facility] that prompted transfer to Nurse Triage: pain, red blotches,   Pt called in he thinks he pulled something, red spots and blotches overhis chest, pain, all around where his pace maker is, he had trouble sleeping kneck pains,

## 2023-12-05 NOTE — Telephone Encounter (Signed)
 Called pt in regards to OV 12/20/23. Reports communicated with staff yesterday.   Advised pt communication yesterday was only pertaining to concern r/t PPM and redness.  Advised pt OV is still needed.    Pt reports he can't drive and wife has declining health r/t Parkinson's.  Pt reports will try to find transportation and care for spouse.  If can't keep appointment will call back to reschedule.

## 2023-12-06 NOTE — Telephone Encounter (Signed)
 Noted. Thanks.

## 2023-12-07 ENCOUNTER — Telehealth: Admitting: Family Medicine

## 2023-12-08 ENCOUNTER — Ambulatory Visit (INDEPENDENT_AMBULATORY_CARE_PROVIDER_SITE_OTHER): Admitting: Family Medicine

## 2023-12-08 ENCOUNTER — Encounter: Payer: Self-pay | Admitting: Family Medicine

## 2023-12-08 ENCOUNTER — Ambulatory Visit: Admitting: Family Medicine

## 2023-12-08 VITALS — BP 162/66 | HR 52 | Temp 99.9°F | Ht 70.0 in | Wt 170.1 lb

## 2023-12-08 DIAGNOSIS — B029 Zoster without complications: Secondary | ICD-10-CM | POA: Diagnosis not present

## 2023-12-08 MED ORDER — VALACYCLOVIR HCL 1 G PO TABS: 1000.0000 mg | ORAL_TABLET | Freq: Three times a day (TID) | ORAL | 0 refills | Status: DC

## 2023-12-08 MED ORDER — TRAMADOL HCL 50 MG PO TABS: 25.0000 mg | ORAL_TABLET | Freq: Three times a day (TID) | ORAL | 0 refills | Status: DC | PRN

## 2023-12-08 NOTE — Assessment & Plan Note (Signed)
 Acute, rash most consistent with shingles.  Recommend treatment with valacyclovir  course despite being into rash over 3 days.  Pain control with Tylenol , use tramadol  for breakthrough pain. Neck pain likely associated with pathic irritation.  Reviewed expected time course of shingles and and contagiousness.  Return and ER precautions provided.

## 2023-12-08 NOTE — Progress Notes (Signed)
 Patient ID: [name] [name], male    DOB: 18-Oct-1934, 89 y.o.   MRN: [id number]  This visit was conducted in person.  BP (!) 162/66   Pulse (!) 35   Temp 99.9 F (37.7 C) (Temporal)   Ht 5' 10 (1.778 m)   Wt 170 lb 2 oz (77.2 kg)   SpO2 98%   BMI 24.41 kg/m    CC:  Chief Complaint  Patient presents with   Rash    Chest/Neck and Arm    Subjective:   HPI: [name] [name] is a 89 y.o. male pt of Dr. Elfredia presenting on 12/08/2023 for Rash (Chest/Neck and Arm)   New onset  rash on left upper neck, chest and arm.   5-7 days ago noted red lesions, blisters.   No fever.   Having severe pain in left neck, no itchy, no burning.  Keeping him up at night.   Using tylneol... did not help.    Not  on immunosuppressant, no DM.   HR very low in office today     Has history of afib and aortic valve stenosis s/p TAVR in last year  Has had pacemaker placed in last year.  1.5 week ago pacer working well per 8/11 virtual visit.  HR 69 at cardiology visit on 10/04/2023  Relevant past medical, surgical, family and social history reviewed and updated as indicated. Interim medical history since our last visit reviewed. Allergies and medications reviewed and updated. Outpatient Medications Prior to Visit  Medication Sig Dispense Refill   acetaminophen  (TYLENOL ) 500 MG tablet Take 500-1,000 mg by mouth every 6 (six) hours as needed (pain.).     apixaban  (ELIQUIS ) 5 MG TABS tablet Take 1 tablet (5 mg total) by mouth 2 (two) times daily. Take evening dose starting 07/09/20 180 tablet 3   Blood Pressure Monitoring (B-D ASSURE BPM/AUTO ARM CUFF) MISC Use check to BP.  Omron cuff if possible.  Dx. HTN 1 each 0   cetirizine  (ZYRTEC ) 10 MG tablet Take 1 tablet (10 mg total) by mouth daily.     fluticasone  (FLONASE ) 50 MCG/ACT nasal spray Place 1 spray into both nostrils daily. 16 g 1   hydrochlorothiazide  (HYDRODIURIL ) 25 MG tablet Take 1 tablet (25 mg total) by mouth daily. 90  tablet 3   Iron , Ferrous Sulfate , 325 (65 Fe) MG TABS Take 325 mg by mouth daily.     latanoprost (XALATAN) 0.005 % ophthalmic solution Place 1 drop into both eyes at bedtime.     melatonin 3 MG TABS tablet Take 3 mg by mouth at bedtime.     Polyethyl Glycol-Propyl Glycol (SYSTANE ULTRA OP) Place 1-2 drops into both eyes once as needed (dry eyes.).     potassium chloride  (KLOR-CON  M) 10 MEQ tablet Take 10 mEq by mouth in the morning.     pravastatin  (PRAVACHOL ) 80 MG tablet Take 1 tablet (80 mg total) by mouth every evening. 90 tablet 0   No facility-administered medications prior to visit.     Per HPI unless specifically indicated in ROS section below Review of Systems  Constitutional:  Negative for fatigue and fever.  HENT:  Negative for ear pain.   Eyes:  Negative for pain.  Respiratory:  Negative for cough and shortness of breath.   Cardiovascular:  Negative for chest pain, palpitations and leg swelling.  Gastrointestinal:  Negative for abdominal pain.  Genitourinary:  Negative for dysuria.  Musculoskeletal:  Negative for arthralgias.  Skin:  Positive for  rash.  Neurological:  Negative for syncope, light-headedness and headaches.  Psychiatric/Behavioral:  Negative for dysphoric mood.    Objective:  BP (!) 162/66   Pulse (!) 35   Temp 99.9 F (37.7 C) (Temporal)   Ht 5' 10 (1.778 m)   Wt 170 lb 2 oz (77.2 kg)   SpO2 98%   BMI 24.41 kg/m   Wt Readings from Last 3 Encounters:  12/08/23 170 lb 2 oz (77.2 kg)  10/04/23 194 lb (88 kg)  09/25/23 188 lb (85.3 kg)      Physical Exam Vitals reviewed.  Constitutional:      Appearance: He is well-developed.  HENT:     Head: Normocephalic.     Right Ear: Hearing normal.     Left Ear: Hearing normal.     Nose: Nose normal.  Neck:     Thyroid : No thyroid  mass or thyromegaly.     Vascular: No carotid bruit.     Trachea: Trachea normal.  Cardiovascular:     Rate and Rhythm: Normal rate and regular rhythm.     Pulses:  Normal pulses.     Heart sounds: Heart sounds not distant. No murmur heard.    No friction rub. No gallop.     Comments: No peripheral edema Pulmonary:     Effort: Pulmonary effort is normal. No respiratory distress.     Breath sounds: Normal breath sounds.  Musculoskeletal:     Cervical back: Tenderness present. Decreased range of motion.  Skin:    General: Skin is warm and dry.     Findings: No rash.  Psychiatric:        Speech: Speech normal.        Behavior: Behavior normal.        Thought Content: Thought content normal.       Results for orders placed or performed during the hospital encounter of 10/26/23  ECHOCARDIOGRAM COMPLETE   Collection Time: 10/26/23  2:46 PM  Result Value Ref Range   Area-P 1/2 3.13 cm2   S' Lateral 3.40 cm   AV Area mean vel 2.70 cm2   AR max vel 2.53 cm2   AV Area VTI 2.54 cm2   P 1/2 time 490 msec   Ao pk vel 2.36 m/s   AV Mean grad 13.0 mmHg   AV Peak grad 22.3 mmHg   Est EF 65 - 70%    *Note: Due to a large number of results and/or encounters for the requested time period, some results have not been displayed. A complete set of results can be found in Results Review.    Assessment and Plan  There are no diagnoses linked to this encounter.  No follow-ups on file.   Greig Ring, MD

## 2023-12-14 ENCOUNTER — Ambulatory Visit: Payer: Self-pay

## 2023-12-14 NOTE — Telephone Encounter (Signed)
 Call  Did tramadol  50 mg TID not help with pain  is patient out?

## 2023-12-14 NOTE — Telephone Encounter (Signed)
 FYI Only or Action Required?: update on pt condition  Patient was last seen in primary care on 12/08/2023 by Avelina Greig BRAVO, MD.  Called Nurse Triage reporting Herpes Zoster.  Symptoms began a week ago.  Interventions attempted: Prescription medications: valtrex .  Symptoms are: gradually improving.  Triage Disposition: See PCP Within 2 Weeks  Patient/caregiver understands and will follow disposition?: No, refuses dispositionCopied from CRM [id number]. Topic: Clinical - Red Word Triage >> Dec 14, 2023  2:42 PM Chiquita SQUIBB wrote: Red Word that prompted transfer to Nurse Triage: Patient was in the office 08/15, and the patient began taking medication after the visit and he is not improving, patient states he is now having pain on the left side of his neck, ears and arm, that feels like nerve pain. Reason for Disposition  [1] Shingles rash already diagnosed AND [2] weak immune system (e.g., HIV positive, cancer chemotherapy, chronic steroid treatment, splenectomy) AND [3] taking antiviral medication  Answer Assessment - Initial Assessment Questions Pt has 2 pills left of antiviral medication. Pt's left side is still bothering him. Overall I'm healing but this nerve pain is bother me. Pt is requesting medication to be called in for pain. Pt unable to come to appt. Please advise.      1. APPEARANCE of RASH: What does the rash look like?      Scabs- healing  2. LOCATION: Where is the rash located?      Left side: ear, neck, arm  3. ONSET: When did the rash start?      Last week 4. ITCHING: Does the rash itch? If Yes, ask: How bad is the itch?  (Scale 1-10; or mild, moderate, severe)     Very little  5. PAIN: Does the rash hurt? If Yes, ask: How bad is the pain?  (Scale 0-10; or none, mild, moderate, severe)     6 6. OTHER SYMPTOMS: Do you have any other symptoms? (e.g., fever)     denies  Protocols used: Shingles (Zoster)-A-AH

## 2023-12-15 ENCOUNTER — Other Ambulatory Visit: Payer: Self-pay | Admitting: Family Medicine

## 2023-12-15 MED ORDER — LIDOCAINE 5 % EX PTCH
1.0000 | MEDICATED_PATCH | CUTANEOUS | 0 refills | Status: DC
Start: 2023-12-15 — End: 2024-01-03

## 2023-12-15 NOTE — Telephone Encounter (Signed)
 He didn't tolerate gabapentin  prior.  Called pt.  Discussed using lidocaine  patch.  Rx sent.  He understood.  Condolences given re: his wife.  He thanked me for the call.

## 2023-12-15 NOTE — Telephone Encounter (Signed)
 So sorry to hear that. If tramadol  did not help could try gabapentin  or lidocaine  patch yeses.

## 2023-12-15 NOTE — Telephone Encounter (Signed)
 Spoke with [name] [name] this morning.  He doesn't think the Tramdol helped but then he stopped the conversation to let me know his wife passed away last night and he has a lot going on right now.  He states he will let things lie for now.  I ask him to please call us  back if he needs anything.   FYI to Dr. Avelina and Dr. Cleatus.

## 2023-12-20 ENCOUNTER — Ambulatory Visit: Admitting: Pulmonary Disease

## 2023-12-21 ENCOUNTER — Other Ambulatory Visit: Payer: Self-pay | Admitting: Family Medicine

## 2023-12-28 ENCOUNTER — Ambulatory Visit (INDEPENDENT_AMBULATORY_CARE_PROVIDER_SITE_OTHER): Admitting: Family Medicine

## 2023-12-28 ENCOUNTER — Encounter: Payer: Self-pay | Admitting: Family Medicine

## 2023-12-28 VITALS — BP 148/82 | HR 61 | Temp 98.3°F | Ht 70.0 in | Wt 171.4 lb

## 2023-12-28 DIAGNOSIS — R42 Dizziness and giddiness: Secondary | ICD-10-CM

## 2023-12-28 DIAGNOSIS — I10 Essential (primary) hypertension: Secondary | ICD-10-CM | POA: Diagnosis not present

## 2023-12-28 DIAGNOSIS — B029 Zoster without complications: Secondary | ICD-10-CM

## 2023-12-28 LAB — CBC WITH DIFFERENTIAL/PLATELET
Basophils Absolute: 0 K/uL (ref 0.0–0.1)
Basophils Relative: 0.4 % (ref 0.0–3.0)
Eosinophils Absolute: 0 K/uL (ref 0.0–0.7)
Eosinophils Relative: 0.5 % (ref 0.0–5.0)
HCT: 38.3 % — ABNORMAL LOW (ref 39.0–52.0)
Hemoglobin: 12.8 g/dL — ABNORMAL LOW (ref 13.0–17.0)
Lymphocytes Relative: 29 % (ref 12.0–46.0)
Lymphs Abs: 1.3 K/uL (ref 0.7–4.0)
MCHC: 33.4 g/dL (ref 30.0–36.0)
MCV: 96.5 fl (ref 78.0–100.0)
Monocytes Absolute: 0.6 K/uL (ref 0.1–1.0)
Monocytes Relative: 12.3 % — ABNORMAL HIGH (ref 3.0–12.0)
Neutro Abs: 2.7 K/uL (ref 1.4–7.7)
Neutrophils Relative %: 57.8 % (ref 43.0–77.0)
Platelets: 153 K/uL (ref 150.0–400.0)
RBC: 3.97 Mil/uL — ABNORMAL LOW (ref 4.22–5.81)
RDW: 16.5 % — ABNORMAL HIGH (ref 11.5–15.5)
WBC: 4.6 K/uL (ref 4.0–10.5)

## 2023-12-28 LAB — COMPREHENSIVE METABOLIC PANEL WITH GFR
ALT: 13 U/L (ref 0–53)
AST: 25 U/L (ref 0–37)
Albumin: 4 g/dL (ref 3.5–5.2)
Alkaline Phosphatase: 63 U/L (ref 39–117)
BUN: 19 mg/dL (ref 6–23)
CO2: 34 meq/L — ABNORMAL HIGH (ref 19–32)
Calcium: 9 mg/dL (ref 8.4–10.5)
Chloride: 99 meq/L (ref 96–112)
Creatinine, Ser: 0.89 mg/dL (ref 0.40–1.50)
GFR: 77.17 mL/min (ref >60.00)
Glucose, Bld: 89 mg/dL (ref 70–99)
Potassium: 3.7 meq/L (ref 3.5–5.1)
Sodium: 139 meq/L (ref 135–145)
Total Bilirubin: 0.7 mg/dL (ref 0.2–1.2)
Total Protein: 7.2 g/dL (ref 6.0–8.3)

## 2023-12-28 LAB — TSH: TSH: 1.89 u[IU]/mL (ref 0.35–5.50)

## 2023-12-28 NOTE — Progress Notes (Signed)
 Still dizzy.  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  He had prev eval suggestive of a central/cerebellar abnormality.  Discussed possible BPV.  Discussed vestibular rehab.  Unclear is sx are worse due to shingles, ie that is possible.  Using a rollator.  Fall cautions d/w pt. He fell cooking food a few days ago, due to vertigo.  He fell into the walker, not to the ground.    H/o HTN. BP mildly elevated on hydrochlorothiazide , likely from pain from shingles.  Labs pending.   D/w pt that I would see if he could get vestibular rehab set up at home.   He is having more episodes of vertigo overall, discussed possible repeat MRI.    H/o shingles.  Pain along the L scapula and upper L chest.  Pain is constant.  He used old lidoderm  patches but they were not in date.  He has new rx at the pharmacy.  Sleep disrupted from pain.    Widowed, d/w pt.  Condolences offered.    Meds, vitals, and allergies reviewed.   ROS: Per HPI unless specifically indicated in ROS section   Nad Ncat Neck supple, no LA Resolving chronic skin changes on the left neck and upper back shingles rash site. IRR Ctab Abdomen soft.  Nontender. Walking with walker.  30 minutes were devoted to patient care in this encounter (this includes time spent reviewing the patient's file/history, interviewing and examining the patient, counseling/reviewing plan with patient).

## 2023-12-28 NOTE — Patient Instructions (Addendum)
 Let me know if the new patches don't help.  Let me know if you want to get the MRI set up.  Take care.  Glad to see you. Labs today.

## 2023-12-29 ENCOUNTER — Ambulatory Visit: Payer: Self-pay | Admitting: Family Medicine

## 2023-12-29 ENCOUNTER — Ambulatory Visit (INDEPENDENT_AMBULATORY_CARE_PROVIDER_SITE_OTHER)

## 2023-12-29 ENCOUNTER — Other Ambulatory Visit: Payer: Self-pay | Admitting: Family Medicine

## 2023-12-29 ENCOUNTER — Ambulatory Visit: Payer: Self-pay

## 2023-12-29 DIAGNOSIS — I442 Atrioventricular block, complete: Secondary | ICD-10-CM | POA: Diagnosis not present

## 2023-12-29 MED ORDER — PREGABALIN 25 MG PO CAPS: 25.0000 mg | ORAL_CAPSULE | Freq: Two times a day (BID) | ORAL | Status: DC

## 2023-12-29 MED ORDER — PREGABALIN 25 MG PO CAPS: 25.0000 mg | ORAL_CAPSULE | Freq: Two times a day (BID) | ORAL | 0 refills | Status: DC

## 2023-12-29 NOTE — Telephone Encounter (Signed)
 His labs are okay- no sig issues there.    He could try taking lyrica  25mg  twice a day for pain.  See if that helps and if it is tolerated.  It may cause some lower leg swelling.  Please have him update us  in a few days.    Thanks.

## 2023-12-29 NOTE — Telephone Encounter (Signed)
 FYI Only or Action Required?: FYI only for provider.  Patient was last seen in primary care on 12/28/2023 by Cleatus Arlyss RAMAN, MD.  Called Nurse Triage reporting Herpes Zoster.  Symptoms began several weeks ago.  Interventions attempted: OTC medications: Tylenol , Prescription medications: Antiviral, and Rest, hydration, or home remedies.  Symptoms are: unchanged.  Triage Disposition: Home Care  Patient/caregiver understands and will follow disposition?: Yes  Reason for Disposition  [1] Shingles rash already diagnosed and [2] taking antiviral medication  Answer Assessment - Initial Assessment Questions Patient calling in due to pain related to Shingles and letting PCP know Lidocaine  patches are not helping. Would still like some time to think about scheduling an MRI. He would like a call back regarding the results of his lab work from yesterday. [phone].   1. LOCATION: Where is the rash located?       L scapula and upper L chest   2. ONSET: When did the rash start?      4 weeks  3. ITCHING: Does the rash itch? If Yes, ask: How bad is the itch?  (Scale 1-10; or mild, moderate, severe)     Not too much   4. PAIN: Does the rash hurt? If Yes, ask: How bad is the pain?  (Scale 0-10; or none, mild, moderate, severe)     Severe  5. OTHER SYMPTOMS: Do you have any other symptoms? (e.g., fever)     Dizziness for almost a year  Protocols used: Shingles (Zoster)-A-AH    Copied from CRM [id number]. Topic: Clinical - Red Word Triage >> Dec 29, 2023 10:05 AM [place] A wrote: [facility] that prompted transfer to Nurse Triage: Shingles and is having pain

## 2023-12-29 NOTE — Telephone Encounter (Signed)
 Spoke with patient and advised of lab results. He is aware that the prescription has been sent in and to inform us  in a few days if he is able to tolerate the medication and how it helps the pain. He is weary about taking the meds because of the possible leg swelling but he will try.

## 2023-12-29 NOTE — Addendum Note (Signed)
 Addended by: CLEATUS ARLYSS RAMAN on: 12/29/2023 01:57 PM   Modules accepted: Orders

## 2023-12-30 LAB — CUP PACEART REMOTE DEVICE CHECK
Battery Remaining Longevity: 158 mo
Battery Voltage: 3.13 V
Brady Statistic RV Percent Paced: 78.82 %
Date Time Interrogation Session: 20250905022629
Implantable Lead Connection Status: 753985
Implantable Lead Implant Date: 20241209
Implantable Lead Location: 753860
Implantable Lead Model: 5076
Implantable Pulse Generator Implant Date: 20241209
Lead Channel Impedance Value: 380 Ohm
Lead Channel Impedance Value: 513 Ohm
Lead Channel Pacing Threshold Amplitude: 0.5 V
Lead Channel Pacing Threshold Pulse Width: 0.4 ms
Lead Channel Sensing Intrinsic Amplitude: 18.375 mV
Lead Channel Sensing Intrinsic Amplitude: 18.375 mV
Lead Channel Setting Pacing Amplitude: 2 V
Lead Channel Setting Pacing Pulse Width: 0.4 ms
Lead Channel Setting Sensing Sensitivity: 2 mV
Zone Setting Status: 755011

## 2023-12-30 NOTE — Assessment & Plan Note (Signed)
 The concern is for a central cause with his previous infarction in the pons.  Discussed.  Home health referral placed to see if we can get this set up at home, specifically for vestibular rehab.  Fall cautions discussed with patient.  Using walker in the meantime.  Discussed that we could repeat his MRI if desired.  He will let me know if he wants to go over that.

## 2023-12-30 NOTE — Progress Notes (Unsigned)
 Electrophysiology Office Note:   Date:  12/30/2023  ID:  [name], [name] 03/12/1935, MRN [id number]  Primary Cardiologist: Oneil Parchment, MD Primary Heart Failure: None Electrophysiologist: Will Gladis Norton, MD   {Click to update primary MD,subspecialty MD or APP then REFRESH:1}    History of Present Illness:   [name] [name] is a 89 y.o. male with h/o AF, VT - RVOT, AS s/p TAVR 10/2022 > LBBB/RBBB s/p PPM, CAD s/p CABG, micturition syncope, CVA, carotid disease s/p CEA seen today for routine electrophysiology followup.   He ws seen in 09/2023 for LE swelling after all his antihypertensives and diuretics were stopped due to dizziness. He was put back on his prior hydrochlorothiazide  dosing. He was to follow up in two weeks but was lost to follow up. By chart review, he fell in July 2025 and was seen in the ER. XRAYs were negative at that time. After further audiology evaluation he was diagnosed with BPPV. In the interim, he had shingles and his wife passed away.   Since last being seen in our clinic the patient reports doing ***.    He denies chest pain, palpitations, dyspnea, PND, orthopnea, nausea, vomiting, dizziness, syncope, edema, weight gain, or early satiety.   Review of systems complete and found to be negative unless listed in HPI.    EP Information / Studies Reviewed:    EKG is not ordered today. EKG from 07/10/23 reviewed which showed VP with PVC's 68 bpm      PPM Interrogation-  reviewed in detail today,  See PACEART report. *** interrogated?  Device History: Medtronic Single Chamber PPM implanted 04/03/23 for alternating BBB s/p TAVR  Studies:  [facility] 10/14/22 > severe native CAD with patent bypass grafts  ECHO 11/2022 > LVEF 60-65%, AS mean gradient 8    Arrhythmia / AAD AF  RVOT VT   Risk Assessment/Calculations:    CHA2DS2-VASc Score = 6  {Confirm score is correct.  If not, click here to update score.  REFRESH note.  :1} This indicates a 9.7% annual  risk of stroke. The patient's score is based upon: CHF History: 0 HTN History: 1 Diabetes History: 0 Stroke History: 2 Vascular Disease History: 1 Age Score: 2 Gender Score: 0   {This patient has a significant risk of stroke if diagnosed with atrial fibrillation.  Please consider VKA or DOAC agent for anticoagulation if the bleeding risk is acceptable.   You can also use the SmartPhrase .[email or web address] for documentation.   :[id number]} No BP recorded.  {Refresh Note OR Click here to enter BP  :1}***        Physical Exam:   VS:  There were no vitals taken for this visit.   Wt Readings from Last 3 Encounters:  12/28/23 171 lb 6.4 oz (77.7 kg)  12/08/23 170 lb 2 oz (77.2 kg)  10/04/23 194 lb (88 kg)     GEN: Well nourished, well developed in no acute distress NECK: No JVD; No carotid bruits CARDIAC: {EPRHYTHM:28826}, no murmurs, rubs, gallops RESPIRATORY:  Clear to auscultation without rales, wheezing or rhonchi  ABDOMEN: Soft, non-tender, non-distended EXTREMITIES:  No edema; No deformity   ASSESSMENT AND PLAN:    Alternating BBB s/p TAVR s/p Medtronic PPM  -Normal PPM function *** -See Pace Art report -No changes today  LE Swelling  -on hydrochlorothiazide  ? *** -compression stockings, elevated LE's   Permanent Atrial Fibrillation  CHA2DS2-VASc 6 -OAC for stroke prophylaxis   Secondary Hypercoagulable State  -continue Eliquis  5mg  BID,  dose reviewed and appropriate by wt / Cr  PVC's  RVOT VT  -asymptomatic PVC's   Hypertension  -not on agents due to dizziness ***  -hydrochlorothiazide  added back as above  -plan in June was to recheck labs / assess LE's but lost to f/u ***  CAD s/p CABG  AS s/p TAVR -no anginal symptoms  -most recent ECHO with normal functioning AV   Dizziness  -work up per PCP  -prior vestibular rehab with no improvement  -was recommended by ENT to audiometric evaluation at [facility]    Disposition:   Follow up with Dr. Inocencio  {EPFOLLOW LE:71826}  Signed, Daphne Barrack, NP-C, AGACNP-BC [facility] - Electrophysiology  12/30/2023, 5:29 PM

## 2023-12-30 NOTE — Assessment & Plan Note (Signed)
 With both zoster pain.  Discussed using lidocaine  patches.  He can let me know if is not helping.

## 2023-12-31 ENCOUNTER — Ambulatory Visit: Payer: Self-pay | Admitting: Cardiology

## 2024-01-01 ENCOUNTER — Other Ambulatory Visit: Payer: Self-pay

## 2024-01-01 ENCOUNTER — Emergency Department (HOSPITAL_COMMUNITY)

## 2024-01-01 ENCOUNTER — Emergency Department (HOSPITAL_COMMUNITY)
Admission: EM | Admit: 2024-01-01 | Discharge: 2024-01-01 | Disposition: A | Discharge status: Home / Self Care | Attending: Emergency Medicine | Admitting: Emergency Medicine

## 2024-01-01 DIAGNOSIS — M5031 Other cervical disc degeneration,  high cervical region: Secondary | ICD-10-CM | POA: Diagnosis not present

## 2024-01-01 DIAGNOSIS — I10 Essential (primary) hypertension: Secondary | ICD-10-CM | POA: Insufficient documentation

## 2024-01-01 DIAGNOSIS — I4891 Unspecified atrial fibrillation: Secondary | ICD-10-CM | POA: Diagnosis not present

## 2024-01-01 DIAGNOSIS — Z7901 Long term (current) use of anticoagulants: Secondary | ICD-10-CM | POA: Diagnosis not present

## 2024-01-01 DIAGNOSIS — M542 Cervicalgia: Secondary | ICD-10-CM | POA: Insufficient documentation

## 2024-01-01 DIAGNOSIS — M47812 Spondylosis without myelopathy or radiculopathy, cervical region: Secondary | ICD-10-CM | POA: Diagnosis not present

## 2024-01-01 DIAGNOSIS — M4312 Spondylolisthesis, cervical region: Secondary | ICD-10-CM | POA: Diagnosis not present

## 2024-01-01 DIAGNOSIS — R42 Dizziness and giddiness: Secondary | ICD-10-CM | POA: Diagnosis not present

## 2024-01-01 DIAGNOSIS — R21 Rash and other nonspecific skin eruption: Secondary | ICD-10-CM | POA: Diagnosis not present

## 2024-01-01 DIAGNOSIS — S199XXA Unspecified injury of neck, initial encounter: Secondary | ICD-10-CM | POA: Diagnosis not present

## 2024-01-01 LAB — CBC
HCT: 41.5 % (ref 39.0–52.0)
Hemoglobin: 13.6 g/dL (ref 13.0–17.0)
MCH: 32.2 pg (ref 26.0–34.0)
MCHC: 32.8 g/dL (ref 30.0–36.0)
MCV: 98.3 fL (ref 80.0–100.0)
Platelets: 151 K/uL (ref 150–400)
RBC: 4.22 MIL/uL (ref 4.22–5.81)
RDW: 15.9 % — ABNORMAL HIGH (ref 11.5–15.5)
WBC: 4.8 K/uL (ref 4.0–10.5)
nRBC: 0 % (ref 0.0–0.2)

## 2024-01-01 LAB — BASIC METABOLIC PANEL WITH GFR
Anion gap: 13 (ref 5–15)
BUN: 14 mg/dL (ref 8–23)
CO2: 27 mmol/L (ref 22–32)
Calcium: 9.2 mg/dL (ref 8.9–10.3)
Chloride: 99 mmol/L (ref 98–111)
Creatinine, Ser: 0.88 mg/dL (ref 0.61–1.24)
GFR, Estimated: >60 mL/min (ref >60)
Glucose, Bld: 104 mg/dL — ABNORMAL HIGH (ref 70–99)
Potassium: 3.4 mmol/L — ABNORMAL LOW (ref 3.5–5.1)
Sodium: 139 mmol/L (ref 135–145)

## 2024-01-01 LAB — TROPONIN I (HIGH SENSITIVITY): Troponin I (High Sensitivity): 15 ng/L (ref <18)

## 2024-01-01 MED ORDER — KETOROLAC TROMETHAMINE 15 MG/ML IJ SOLN
15.0000 mg | Freq: Once | INTRAMUSCULAR | Status: AC
  Administered 2024-01-01: 15 mg via INTRAVENOUS
  Filled 2024-01-01: qty 1

## 2024-01-01 MED ORDER — DEXAMETHASONE SODIUM PHOSPHATE 10 MG/ML IJ SOLN
6.0000 mg | Freq: Once | INTRAMUSCULAR | Status: AC
  Administered 2024-01-01: 6 mg via INTRAVENOUS
  Filled 2024-01-01: qty 1

## 2024-01-01 NOTE — ED Provider Notes (Signed)
 [facility] [address] [facility]MENT AT [facility] Provider Note   CSN: [id number] Arrival date & time: 01/01/24  9149     Patient presents with: Herpes Zoster and Neck Pain   [name] [name] is a 89 y.o. male presented to ED complaining of neck pain.  Patient ports had a shingles outbreak in his left shoulder and neck about 5 weeks ago.  The lesion has since crusted over.  The pain of the shingles has basically gone away.  But he notes about 5 days ago he woke up in the morning with a cramping pain in the left lower side of his neck.  It radiates into both of the shoulders most of the left shoulder.  It is worse with any type of lateral head movement.  He denies any chest pressure pain.  He says he has tried lidocaine  patches and Tylenol  without relief.  He does not tolerate opioid medications at home well, because they make him dizzy.  The same applies to gabapentin .  He has a history of a pacemaker.  He is on Eliquis  with a history of A-fib.  He is also on Lyrica  and a statin   HPI     Prior to Admission medications   Medication Sig Start Date End Date Taking? Authorizing Provider  acetaminophen  (TYLENOL ) 500 MG tablet Take 500-1,000 mg by mouth every 6 (six) hours as needed (pain.).    [provider]  apixaban  (ELIQUIS ) 5 MG TABS tablet Take 1 tablet (5 mg total) by mouth 2 (two) times daily. Take evening dose starting 07/09/20 07/09/20   Baglia, Corrina, PA-C  Blood Pressure Monitoring (B-D ASSURE BPM/AUTO ARM CUFF) MISC Use check to BP.  Omron cuff if possible.  Dx. HTN 06/23/23   Cleatus Arlyss RAMAN, MD  cetirizine  (ZYRTEC ) 10 MG tablet Take 1 tablet (10 mg total) by mouth daily. 10/29/21   Cleatus Arlyss RAMAN, MD  hydrochlorothiazide  (HYDRODIURIL ) 25 MG tablet Take 1 tablet (25 mg total) by mouth daily. 10/04/23 01/02/24  Aniceto Daphne CROME, NP  Iron , Ferrous Sulfate , 325 (65 Fe) MG TABS Take 325 mg by mouth daily. 03/26/23   Cleatus Arlyss RAMAN, MD  latanoprost (XALATAN) 0.005 %  ophthalmic solution Place 1 drop into both eyes at bedtime. 07/01/20   [provider]  lidocaine  (LIDODERM ) 5 % Place 1 patch onto the skin daily. Remove & Discard patch within 12 hours or as directed by MD Patient not taking: Reported on 12/28/2023 12/15/23   Cleatus Arlyss RAMAN, MD  Polyethyl Glycol-Propyl Glycol (SYSTANE ULTRA OP) Place 1-2 drops into both eyes once as needed (dry eyes.).    [provider]  potassium chloride  (KLOR-CON  M) 10 MEQ tablet Take 10 mEq by mouth in the morning.    [provider]  pravastatin  (PRAVACHOL ) 80 MG tablet Take 1 tablet (80 mg total) by mouth every evening. 01/01/21   Fernande Elspeth BROCKS, MD  pregabalin  (LYRICA ) 25 MG capsule Take 1 capsule (25 mg total) by mouth 2 (two) times daily. 12/29/23   Cleatus Arlyss RAMAN, MD    Allergies: Hydralazine  hcl, Ace inhibitors, Zocor [simvastatin], Nsaids, Prednisone , Cephalexin, Chocolate, Doxycycline , Gabapentin , Hycodan [hydrocodone  bit-homatrop mbr], and Oxycodone     Review of Systems  Updated Vital Signs BP (!) 192/84   Pulse (!) 58   Temp 97.8 F (36.6 C) (Oral)   Resp 15   Ht 5' 10 (1.778 m)   Wt 77.1 kg   SpO2 97%   BMI 24.39 kg/m  Physical Exam Constitutional:      General: He is not in acute distress. HENT:     Head: Normocephalic and atraumatic.  Eyes:     Conjunctiva/sclera: Conjunctivae normal.     Pupils: Pupils are equal, round, and reactive to light.  Cardiovascular:     Rate and Rhythm: Normal rate and regular rhythm.  Pulmonary:     Effort: Pulmonary effort is normal. No respiratory distress.  Abdominal:     General: There is no distension.     Tenderness: There is no abdominal tenderness.  Musculoskeletal:     Comments: Tenderness C-spine midline and left paracervical tenderness; positive spurling manuever  Skin:    General: Skin is warm and dry.     Comments: Crusted over lesions involving the left lower neck and the left shoulder and upper anterior chest wall,  does not cross the midline, c4-c5 left distribution  Neurological:     General: No focal deficit present.     Mental Status: He is alert. Mental status is at baseline.  Psychiatric:        Mood and Affect: Mood normal.        Behavior: Behavior normal.     (all labs ordered are listed, but only abnormal results are displayed) Labs Reviewed  BASIC METABOLIC PANEL WITH GFR - Abnormal; Notable for the following components:      Result Value   Potassium 3.4 (*)    Glucose, Bld 104 (*)    All other components within normal limits  CBC - Abnormal; Notable for the following components:   RDW 15.9 (*)    All other components within normal limits  TROPONIN I (HIGH SENSITIVITY)    EKG: None  Radiology: CT Cervical Spine Wo Contrast Result Date: 01/01/2024 EXAM: CT CERVICAL SPINE WITHOUT CONTRAST 01/01/2024 10:58:05 AM TECHNIQUE: CT of the cervical spine was performed without the administration of intravenous contrast. Multiplanar reformatted images are provided for review. Automated exposure control, iterative reconstruction, and/or weight based adjustment of the mA/kV was utilized to reduce the radiation dose to as low as reasonably achievable. COMPARISON: None available. CLINICAL HISTORY: Polytrauma, blunt. Pt arriving via [facility] with CC of left sided neck pain, likely related to shingles diagnosis. He was diagnosed about 4-5 weeks ago. Finished his course of antivirals a couple of weeks ago. Patient states the rash itself seems to be better, but the pain in his neck has worsened over the last week. Rates pain on neck 8/10 and states it is more a muscular pain. AxOx4. FINDINGS: CERVICAL SPINE: BONES AND ALIGNMENT: There is mild reversal of the normal cervical lordosis. There is slight degenerative anterolisthesis at C2-3. 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 DEGENERATIVE CHANGES: There is moderate chronic degenerative disc disease at C3-4, C4-5, C5-6 and  C6-7, as well as C7-T1. SOFT TISSUES: No prevertebral soft tissue swelling. VASCULATURE: There is calcification within the right carotid bulb. LUNGS: There are few right apical pulmonary blebs present. IMPRESSION: 1. No acute abnormality of the cervical spine related to the clinical history of left sided neck pain and polytrauma. 2. Slight degenerative anterolisthesis at C2-3. 3. Moderate chronic degenerative disc disease at C3-4, C4-5, C5-6, C6-7, and C7-T1. Electronically signed by: Evalene Coho MD 01/01/2024 11:29 AM EDT RP Workstation: [id number]     Procedures   Medications Ordered in the ED  dexamethasone  (DECADRON ) injection 6 mg (6 mg Intravenous Given 01/01/24 1223)  ketorolac  (TORADOL ) 15 MG/ML injection 15 mg (  15 mg Intravenous Given 01/01/24 1222)    Clinical Course as of 01/01/24 1332  Mon Jan 01, 2024  1026 HR 60 bpm, Bigemeny pattern V paced, [MT]  1111 Very limited options for pain control.  Will try low-dose shot of Decadron , noting that longer courses of prednisone  have raise his blood pressure in the past.  I think a single dose to be reasonable.  Likewise a low-dose Toradol  shot would be reasonable, despite his anticoagulation, to help with his discomfort.  I would not advise that he continue NSAIDs at home [MT]    Clinical Course User Index [MT] Mariavictoria Nottingham, Donnice PARAS, MD                                 Medical Decision Making Amount and/or Complexity of Data Reviewed Labs: ordered. Radiology: ordered.  Risk Prescription drug management.   This patient presents to the ED with concern for neck pain, positional. This involves an extensive number of treatment options, and is a complaint that carries with it a high risk of complications and morbidity.  The differential diagnosis includes musculoskeletal pain most likely, including postherpetic neuralgia versus cervical radiculopathy or impingement versus muscle strain versus other  Patient denies to me any active chest  pain or pressure.  He reports he has had a chronic lightheaded issues for months or years for which his PCP is working him up.  No change in the symptoms.  At this point his zoster infection should no longer be contagious, now 4 to 5 weeks after initial outbreak with lesions all crusted over.  He is not needing to be on airborne precautions.  I ordered and personally interpreted labs.  The pertinent results include: No emergent findings  I ordered imaging studies including CT scan of the cervical spine I independently visualized and interpreted imaging which showed no emergent finding I agree with the radiologist interpretation  The patient was maintained on a cardiac monitor.  I personally viewed and interpreted the cardiac monitored which showed an underlying rhythm of: Ventricular paced rhythm, frequent PVCs and occasional bigeminy  Per my interpretation the patient's ECG shows ventricular paced rhythm with bigeminy or PVCs, no acute ischemia  I ordered medication including IV Toradol  and Decadron   I have reviewed the patients home medicines and have made adjustments as needed  Test Considered: I have lower suspicion for a vertebral dissection as there were no traumatic mechanism or injury.  Low suspicion for acute meningitis.  Doubt aortic dissection.  After the interventions noted above, I reevaluated the patient and found that they have: stayed the same  Social Determinants of Health: pain control be challenging of this patient.  He does not do well with any type of narcotic medications which make him loopy or dizzy.  This also includes gabapentin .  He has already used Lidoderm  patches, Lyrica , and Tylenol .  We discussed a dose of steroids here in the ED, but I think he would benefit from follow-up with an orthopedic clinic, as the pain does seem quite localized to the left trapezius.  Blood pressure is elevated today which is a chronic issue for the patient.  He will need to follow-up  as an outpatient and take his home medications.  But there is no evidence of hypertensive emergency.   Disposition:  After consideration of the diagnostic results and the patient's response to treatment, I feel that the patent would benefit from outpatient specialist  follow-up      Final diagnoses:  Neck pain  Hypertension, unspecified type    ED Discharge Orders     None          Cottie Donnice PARAS, MD 01/01/24 1332

## 2024-01-01 NOTE — Discharge Instructions (Signed)
 Please call the number above for [facility] at [phone].  Let them know that you were seen in the emergency department for neck pain.  I asked him for the next available appointment with a clinician.  I think your pain is likely coming from your cervical spine or neck.  The orthopedic clinic may be able to further assist with this.  You can also follow-up with your primary care provider if you are still having persistent pain.

## 2024-01-01 NOTE — ED Triage Notes (Signed)
 Pt arriving via [facility] with CC of left sided neck pain, likely related to shingles diagnosis., He was diagnosed about 4-5 weeks ago. Finished his course of antivirals a couple of weeks ago. Patient states the rash itself seems to be better, but the pain in his neck has worsened over the last week. Rates pain on neck 8/10 and states it is more a muscular pain. AxOx4.

## 2024-01-01 NOTE — ED Notes (Signed)
 Got patient into  [facility] THE MONITOR DID EKG SHOWN TO ER PROVIDER

## 2024-01-03 ENCOUNTER — Ambulatory Visit: Attending: Pulmonary Disease | Admitting: Pulmonary Disease

## 2024-01-03 ENCOUNTER — Encounter: Payer: Self-pay | Admitting: Pulmonary Disease

## 2024-01-03 VITALS — BP 156/84 | HR 71 | Ht 70.0 in

## 2024-01-03 DIAGNOSIS — Z950 Presence of cardiac pacemaker: Secondary | ICD-10-CM | POA: Insufficient documentation

## 2024-01-03 DIAGNOSIS — Z952 Presence of prosthetic heart valve: Secondary | ICD-10-CM | POA: Diagnosis not present

## 2024-01-03 DIAGNOSIS — I4821 Permanent atrial fibrillation: Secondary | ICD-10-CM | POA: Diagnosis not present

## 2024-01-03 DIAGNOSIS — R42 Dizziness and giddiness: Secondary | ICD-10-CM | POA: Diagnosis not present

## 2024-01-03 DIAGNOSIS — I350 Nonrheumatic aortic (valve) stenosis: Secondary | ICD-10-CM | POA: Insufficient documentation

## 2024-01-03 DIAGNOSIS — I442 Atrioventricular block, complete: Secondary | ICD-10-CM | POA: Diagnosis not present

## 2024-01-03 NOTE — Patient Instructions (Signed)
 Medication Instructions:  Your physician recommends that you continue on your current medications as directed. Please refer to the Current Medication list given to you today.  *If you need a refill on your cardiac medications before your next appointment, please call your pharmacy*  Lab Work: NONE If you have labs (blood work) drawn today and your tests are completely normal, you will receive your results only by: MyChart Message (if you have MyChart) OR A paper copy in the mail If you have any lab test that is abnormal or we need to change your treatment, we will call you to review the results.  Testing/Procedures: NONE  Follow-Up: At [facility], you and your health needs are our priority.  As part of our continuing mission to provide you with exceptional heart care, our providers are all part of one team.  This team includes your primary Cardiologist (physician) and Advanced Practice Providers or APPs (Physician Assistants and Nurse Practitioners) who all work together to provide you with the care you need, when you need it.  Your next appointment:   JUNE 2026   Provider:   You may see Will Gladis Norton, MD or one of the following Advanced Practice Providers on your designated Care Team:   Charlies Arthur, PA-C Michael Andy Tillery, PA-C Suzann Riddle, NP Daphne Barrack, NP    We recommend signing up for the patient portal called MyChart.  Sign up information is provided on this After Visit Summary.  MyChart is used to connect with patients for Virtual Visits (Telemedicine).  Patients are able to view lab/test results, encounter notes, upcoming appointments, etc.  Non-urgent messages can be sent to your provider as well.   To learn more about what you can do with MyChart, go to [email or web address].

## 2024-01-04 ENCOUNTER — Telehealth: Payer: Self-pay

## 2024-01-04 DIAGNOSIS — M2550 Pain in unspecified joint: Secondary | ICD-10-CM

## 2024-01-04 NOTE — Addendum Note (Signed)
 Addended by: CLEATUS ARLYSS RAMAN on: 01/04/2024 11:33 PM   Modules accepted: Orders

## 2024-01-04 NOTE — Telephone Encounter (Signed)
 Copied from CRM #[id number]. Topic: Clinical - Medical Advice >> Jan 04, 2024  9:59 AM Charolett L wrote: Reason for CRM: Patient calling in requesting a referral for ortho care due to all his aches and pains and needed some assistance.unable to drive/shower due to constant dizziness and unbalanced without walker

## 2024-01-04 NOTE — Telephone Encounter (Signed)
 I put in the referral for ortho per patient request.   He may be better served by home health.  Please check with referrals.  What is the status of the home health referral for vestibular rehab?

## 2024-01-05 NOTE — Telephone Encounter (Signed)
 Following up on home health referral per provider

## 2024-01-05 NOTE — Telephone Encounter (Signed)
 Patient notified that referral for ortho has been placed.

## 2024-01-09 DIAGNOSIS — B0229 Other postherpetic nervous system involvement: Secondary | ICD-10-CM | POA: Diagnosis not present

## 2024-01-09 DIAGNOSIS — M7918 Myalgia, other site: Secondary | ICD-10-CM | POA: Diagnosis not present

## 2024-01-09 DIAGNOSIS — M542 Cervicalgia: Secondary | ICD-10-CM | POA: Diagnosis not present

## 2024-01-09 NOTE — Telephone Encounter (Signed)
 Please let me know if patient can't get seen at [facility].  Thanks.

## 2024-01-09 NOTE — Telephone Encounter (Signed)
 Ortho Referral was sent to [name] and the patient declined seeing them, I was unaware that he was established with [facility]. His last OV with [facility] was 10/28/23.   Patient reached out to [facility] for an appt today and they notated the following on his chart ( See CareEverywhere)  - I do not see where they scheduled him a visit. Im guessing they are reviewing the telephone encounter and will call the patient back to schedule an OV   01/09/2024 text/html  Patient referred from ED 9/8 for neck pain. CT cervical spine in [place], report in chart. Patient got diagnosed with shingles 6 weeks ago. Two weeks ago, he started having neck pain radiating into the shoulders, worse on the left side. Denies radiating pain past the elbow. No alleviating factors so far. Rates pain 10/10.     As for the [facility] referral, see notes within [facility] referral for updates.

## 2024-01-10 NOTE — Telephone Encounter (Signed)
 Called and spoke with patient. He was seen at [facility] yesterday 01/09/24. Per patient they are wanting to do some shoulder injections is approved by insurance.  Thanked me for my call.

## 2024-01-10 NOTE — Telephone Encounter (Signed)
 Noted. Thanks.

## 2024-01-11 DIAGNOSIS — M7918 Myalgia, other site: Secondary | ICD-10-CM | POA: Diagnosis not present

## 2024-01-11 NOTE — Progress Notes (Signed)
 Remote PPM Transmission

## 2024-01-13 DIAGNOSIS — Z8551 Personal history of malignant neoplasm of bladder: Secondary | ICD-10-CM | POA: Diagnosis not present

## 2024-01-13 DIAGNOSIS — I2510 Atherosclerotic heart disease of native coronary artery without angina pectoris: Secondary | ICD-10-CM | POA: Diagnosis not present

## 2024-01-13 DIAGNOSIS — Z950 Presence of cardiac pacemaker: Secondary | ICD-10-CM | POA: Diagnosis not present

## 2024-01-13 DIAGNOSIS — M1712 Unilateral primary osteoarthritis, left knee: Secondary | ICD-10-CM | POA: Diagnosis not present

## 2024-01-13 DIAGNOSIS — Z9181 History of falling: Secondary | ICD-10-CM | POA: Diagnosis not present

## 2024-01-13 DIAGNOSIS — B029 Zoster without complications: Secondary | ICD-10-CM | POA: Diagnosis not present

## 2024-01-13 DIAGNOSIS — I350 Nonrheumatic aortic (valve) stenosis: Secondary | ICD-10-CM | POA: Diagnosis not present

## 2024-01-13 DIAGNOSIS — G5600 Carpal tunnel syndrome, unspecified upper limb: Secondary | ICD-10-CM | POA: Diagnosis not present

## 2024-01-13 DIAGNOSIS — E785 Hyperlipidemia, unspecified: Secondary | ICD-10-CM | POA: Diagnosis not present

## 2024-01-13 DIAGNOSIS — Z7901 Long term (current) use of anticoagulants: Secondary | ICD-10-CM | POA: Diagnosis not present

## 2024-01-13 DIAGNOSIS — K219 Gastro-esophageal reflux disease without esophagitis: Secondary | ICD-10-CM | POA: Diagnosis not present

## 2024-01-13 DIAGNOSIS — I10 Essential (primary) hypertension: Secondary | ICD-10-CM | POA: Diagnosis not present

## 2024-01-13 DIAGNOSIS — Z8673 Personal history of transient ischemic attack (TIA), and cerebral infarction without residual deficits: Secondary | ICD-10-CM | POA: Diagnosis not present

## 2024-01-13 DIAGNOSIS — I4510 Unspecified right bundle-branch block: Secondary | ICD-10-CM | POA: Diagnosis not present

## 2024-01-13 DIAGNOSIS — H538 Other visual disturbances: Secondary | ICD-10-CM | POA: Diagnosis not present

## 2024-01-13 DIAGNOSIS — M810 Age-related osteoporosis without current pathological fracture: Secondary | ICD-10-CM | POA: Diagnosis not present

## 2024-01-13 DIAGNOSIS — Z952 Presence of prosthetic heart valve: Secondary | ICD-10-CM | POA: Diagnosis not present

## 2024-01-13 DIAGNOSIS — Z602 Problems related to living alone: Secondary | ICD-10-CM | POA: Diagnosis not present

## 2024-01-13 DIAGNOSIS — I4821 Permanent atrial fibrillation: Secondary | ICD-10-CM | POA: Diagnosis not present

## 2024-01-13 DIAGNOSIS — M5430 Sciatica, unspecified side: Secondary | ICD-10-CM | POA: Diagnosis not present

## 2024-01-13 DIAGNOSIS — Z951 Presence of aortocoronary bypass graft: Secondary | ICD-10-CM | POA: Diagnosis not present

## 2024-01-18 ENCOUNTER — Encounter: Payer: Self-pay | Admitting: Family Medicine

## 2024-01-18 NOTE — Telephone Encounter (Signed)
 Patient was seen 12/28/23 in office below is from your note  The concern is for a central cause with his previous infarction in the pons.  Discussed.  Home health referral placed to see if we can get this set up at home, specifically for vestibular rehab.  Fall cautions discussed with patient.  Using walker in the meantime.  Discussed that we could repeat his MRI if desired.  He will let me know if he wants to go over that.

## 2024-01-22 DIAGNOSIS — I10 Essential (primary) hypertension: Secondary | ICD-10-CM | POA: Diagnosis not present

## 2024-01-22 DIAGNOSIS — I2510 Atherosclerotic heart disease of native coronary artery without angina pectoris: Secondary | ICD-10-CM | POA: Diagnosis not present

## 2024-01-22 DIAGNOSIS — I4821 Permanent atrial fibrillation: Secondary | ICD-10-CM | POA: Diagnosis not present

## 2024-01-22 DIAGNOSIS — E785 Hyperlipidemia, unspecified: Secondary | ICD-10-CM | POA: Diagnosis not present

## 2024-01-22 DIAGNOSIS — B029 Zoster without complications: Secondary | ICD-10-CM | POA: Diagnosis not present

## 2024-01-22 DIAGNOSIS — M810 Age-related osteoporosis without current pathological fracture: Secondary | ICD-10-CM | POA: Diagnosis not present

## 2024-01-23 ENCOUNTER — Telehealth: Payer: Self-pay

## 2024-01-23 DIAGNOSIS — I10 Essential (primary) hypertension: Secondary | ICD-10-CM | POA: Diagnosis not present

## 2024-01-23 DIAGNOSIS — I4821 Permanent atrial fibrillation: Secondary | ICD-10-CM | POA: Diagnosis not present

## 2024-01-23 DIAGNOSIS — M810 Age-related osteoporosis without current pathological fracture: Secondary | ICD-10-CM | POA: Diagnosis not present

## 2024-01-23 DIAGNOSIS — B029 Zoster without complications: Secondary | ICD-10-CM | POA: Diagnosis not present

## 2024-01-23 DIAGNOSIS — E785 Hyperlipidemia, unspecified: Secondary | ICD-10-CM | POA: Diagnosis not present

## 2024-01-23 DIAGNOSIS — I2510 Atherosclerotic heart disease of native coronary artery without angina pectoris: Secondary | ICD-10-CM | POA: Diagnosis not present

## 2024-01-23 NOTE — Telephone Encounter (Signed)
 Reached out to [name] [name] with [facility] and left message to advise that we did receive the order

## 2024-01-23 NOTE — Telephone Encounter (Signed)
 Copied from CRM [id number]. Topic: General - Other >> Jan 23, 2024 10:31 AM Timindy P wrote: Reason for CRM: [facility] home health is calling to verify if order sent over on 01/19/2024 have been received for the patient? Order number [id number]. They sent this to fax number [phone]. [employer] is asking if they can please receive a call back to see if this was received or if it needs to be resent.   They can be reached at [phone]

## 2024-01-24 ENCOUNTER — Telehealth: Payer: Self-pay

## 2024-01-24 DIAGNOSIS — M7918 Myalgia, other site: Secondary | ICD-10-CM | POA: Diagnosis not present

## 2024-01-24 DIAGNOSIS — B0229 Other postherpetic nervous system involvement: Secondary | ICD-10-CM | POA: Diagnosis not present

## 2024-01-24 DIAGNOSIS — M5412 Radiculopathy, cervical region: Secondary | ICD-10-CM | POA: Diagnosis not present

## 2024-01-24 DIAGNOSIS — M542 Cervicalgia: Secondary | ICD-10-CM | POA: Diagnosis not present

## 2024-01-24 NOTE — Telephone Encounter (Signed)
 Hi Brandi You saw this patient 01/03/24. He is now pending steroid injections. Can you please weigh in on preop risk and need for further workup prior to injections?  Also sending to pharmD for [facility] hold.   Thanks Angie

## 2024-01-24 NOTE — Telephone Encounter (Signed)
   Pre-operative Risk Assessment    Patient Name: [name] [name]  DOB: [birth year] MRN: [id number]   Date of last office visit: 01/03/24 Date of next office visit: 04/01/24   Request for Surgical Clearance    Procedure:  epidural steroid injection, cervical   Date of Surgery:  Clearance TBD                                 Surgeon:  Dr. Laqueta Socks Group or Practice Name:  [facility]  Phone number:  [phone] Fax number:  [phone]   Type of Clearance Requested:   - Medical  - Pharmacy:  Hold Apixaban  (Eliquis ) 3 days before    Type of Anesthesia:  Not Indicated   Additional requests/questions:    Bonney Rebeca Blight   01/24/2024, 10:50 AM

## 2024-01-25 DIAGNOSIS — M810 Age-related osteoporosis without current pathological fracture: Secondary | ICD-10-CM | POA: Diagnosis not present

## 2024-01-25 DIAGNOSIS — M1712 Unilateral primary osteoarthritis, left knee: Secondary | ICD-10-CM | POA: Diagnosis not present

## 2024-01-25 DIAGNOSIS — I10 Essential (primary) hypertension: Secondary | ICD-10-CM | POA: Diagnosis not present

## 2024-01-25 DIAGNOSIS — I4821 Permanent atrial fibrillation: Secondary | ICD-10-CM | POA: Diagnosis not present

## 2024-01-25 DIAGNOSIS — I2510 Atherosclerotic heart disease of native coronary artery without angina pectoris: Secondary | ICD-10-CM | POA: Diagnosis not present

## 2024-01-25 DIAGNOSIS — B029 Zoster without complications: Secondary | ICD-10-CM | POA: Diagnosis not present

## 2024-01-25 DIAGNOSIS — K219 Gastro-esophageal reflux disease without esophagitis: Secondary | ICD-10-CM | POA: Diagnosis not present

## 2024-01-25 DIAGNOSIS — M5430 Sciatica, unspecified side: Secondary | ICD-10-CM | POA: Diagnosis not present

## 2024-01-25 DIAGNOSIS — I350 Nonrheumatic aortic (valve) stenosis: Secondary | ICD-10-CM | POA: Diagnosis not present

## 2024-01-25 DIAGNOSIS — I4510 Unspecified right bundle-branch block: Secondary | ICD-10-CM | POA: Diagnosis not present

## 2024-01-25 DIAGNOSIS — H538 Other visual disturbances: Secondary | ICD-10-CM | POA: Diagnosis not present

## 2024-01-25 DIAGNOSIS — E785 Hyperlipidemia, unspecified: Secondary | ICD-10-CM | POA: Diagnosis not present

## 2024-01-27 ENCOUNTER — Encounter: Payer: Self-pay | Admitting: Family Medicine

## 2024-01-29 DIAGNOSIS — E785 Hyperlipidemia, unspecified: Secondary | ICD-10-CM | POA: Diagnosis not present

## 2024-01-29 DIAGNOSIS — I2510 Atherosclerotic heart disease of native coronary artery without angina pectoris: Secondary | ICD-10-CM | POA: Diagnosis not present

## 2024-01-29 DIAGNOSIS — B029 Zoster without complications: Secondary | ICD-10-CM | POA: Diagnosis not present

## 2024-01-29 DIAGNOSIS — I4821 Permanent atrial fibrillation: Secondary | ICD-10-CM | POA: Diagnosis not present

## 2024-01-29 DIAGNOSIS — M810 Age-related osteoporosis without current pathological fracture: Secondary | ICD-10-CM | POA: Diagnosis not present

## 2024-01-29 DIAGNOSIS — I10 Essential (primary) hypertension: Secondary | ICD-10-CM | POA: Diagnosis not present

## 2024-01-29 NOTE — Telephone Encounter (Signed)
 Patient with diagnosis of afib on Eliquis  for anticoagulation.    Procedure: epidural steroid injection, cervical  Date of procedure: TBD   CHA2DS2-VASc Score = 6   This indicates a 9.7% annual risk of stroke. The patient's score is based upon: CHF History: 0 HTN History: 1 Diabetes History: 0 Stroke History: 2 Vascular Disease History: 1 Age Score: 2 Gender Score: 0      CrCl 64 ml/min Platelet count 151  Occipital stroke in 2021 subacute lacunar periventricular infarct in 2023  Patient has not had an Afib/aflutter ablation in the last 3 months, DCCV within the last 4 weeks or a watchman implanted in the last 45 days   Given patient's hx of multiple CVA, will defer to Dr. Inocencio as to if patient can hold Eliquis  3 days prior  **This guidance is not considered finalized until pre-operative APP has relayed final recommendations.**

## 2024-01-31 DIAGNOSIS — M810 Age-related osteoporosis without current pathological fracture: Secondary | ICD-10-CM | POA: Diagnosis not present

## 2024-01-31 DIAGNOSIS — I10 Essential (primary) hypertension: Secondary | ICD-10-CM | POA: Diagnosis not present

## 2024-01-31 DIAGNOSIS — I2510 Atherosclerotic heart disease of native coronary artery without angina pectoris: Secondary | ICD-10-CM | POA: Diagnosis not present

## 2024-01-31 DIAGNOSIS — I4821 Permanent atrial fibrillation: Secondary | ICD-10-CM | POA: Diagnosis not present

## 2024-01-31 DIAGNOSIS — B029 Zoster without complications: Secondary | ICD-10-CM | POA: Diagnosis not present

## 2024-01-31 DIAGNOSIS — E785 Hyperlipidemia, unspecified: Secondary | ICD-10-CM | POA: Diagnosis not present

## 2024-01-31 NOTE — Telephone Encounter (Signed)
   Patient Name: [name] [name]  DOB: 09-09-1934 MRN: [id number]  Primary Cardiologist: Oneil Parchment, MD  Chart reviewed as part of pre-operative protocol coverage. Given past medical history and time since last visit, based on ACC/AHA guidelines, DEL OVERFELT is at acceptable risk for the planned procedure without further cardiovascular testing.   Given patient's hx of multiple CVA, will defer to Dr. Inocencio as to if patient can hold Eliquis  3 days prior. Per Dr.Camnitz.  Per Dr. Inocencio on 01/29/2024 Okay to hold anticoagulation per pharmacy protocol   The patient was advised that if he develops new symptoms prior to surgery to contact our office to arrange for a follow-up visit, and he verbalized understanding.  I will route this recommendation to the requesting party via Epic fax function and remove from pre-op pool.  Please call with questions.  Lamarr Satterfield, NP 01/31/2024, 8:31 AM

## 2024-02-02 ENCOUNTER — Telehealth: Payer: Self-pay | Admitting: Cardiology

## 2024-02-02 ENCOUNTER — Encounter: Payer: Self-pay | Admitting: Family Medicine

## 2024-02-02 NOTE — Telephone Encounter (Signed)
 02/02/24  2:35 PM Note S/w the pt and he has been advised of med hold recommendations for Eliquis  x 3 days prior to injection. Pt informed Dr. Laqueta will tell him when to resume. I told the pt that generally the same day or the next day, though to be sure to follow what Dr. Laqueta advises. Pt thanked me for the call back.        02/02/24  2:33 PM You contacted Doroteo Fairy ORN   02/02/24 12:52 PM Tanda Needle B routed this conversation to Cv Div Preop Callback (Selected Message) Wilson, Jasmin B JW   02/02/24 12:52 PM Note Patient wants a call back regarding status of his clearance.       02/02/24 12:49 PM Kraszewski, Fairy ORN contacted Wilson, Jasmin B

## 2024-02-02 NOTE — Telephone Encounter (Signed)
 S/w the pt and he has been advised of med hold recommendations for Eliquis  x 3 days prior to injection. Pt informed Dr. Laqueta will tell him when to resume. I told the pt that generally the same day or the next day, though to be sure to follow what Dr. Laqueta advises. Pt thanked me for the call back.

## 2024-02-02 NOTE — Telephone Encounter (Signed)
 Patient wants a call back regarding status of his clearance.

## 2024-02-05 NOTE — Telephone Encounter (Signed)
 Pt aware we have faxed notes x 3 as of today for the clearance.  Pt would like a call back from Dr. Laqueta office to let him know they got the clearance notes.  Pt said to call [facility] ext [phone].     Clearance notes were faxed again on 02/02/24. I will re-fax the notes again. We have faxed notes to [phone] x 3 as of today.        02/05/24  8:58 AM Tanda, Herma B routed this conversation to Cv Div Preop Callback (Selected Message) Tanda Herma KATHEE MERTIE   02/05/24  8:58 AM Note Patient called to follow-up on clearance forms requested by [facility] and wants them faxed to fax# [phone].  Patient wants a call back to confirm forms sent as he has shot scheduled for Thursday.       02/05/24  8:55 AM Lauter, Fairy ORN contacted Wilson, Jasmin B Me    02/02/24  2:35 PM Note S/w the pt and he has been advised of med hold recommendations for Eliquis  x 3 days prior to injection. Pt informed Dr. Laqueta will tell him when to resume. I told the pt that generally the same day or the next day, though to be sure to follow what Dr. Laqueta advises. Pt thanked me for the call back.       02/02/24  2:33 PM You contacted Doroteo Fairy ORN    02/02/24 12:52 PM Tanda, Jasmin B routed this conversation to Cv Div Preop Callback  Wilson, Jasmin B JW   02/02/24 12:52 PM Note Patient wants a call back regarding status of his clearance.        02/02/24 12:49 PM Omary, Fairy ORN contacted Wilson, Jasmin B

## 2024-02-05 NOTE — Telephone Encounter (Signed)
 Clearance notes were faxed again on 02/02/24. I will re-fax the notes again. We have faxed notes to [phone] x 3 as of today.

## 2024-02-05 NOTE — Telephone Encounter (Signed)
 Patient called to follow-up on clearance forms requested by [facility] and wants them faxed to fax# [phone].  Patient wants a call back to confirm forms sent as he has shot scheduled for Thursday.

## 2024-02-05 NOTE — Telephone Encounter (Signed)
 Left message for surgery scheduler Ginny Molly that we have faxed notes x 3 to [phone]. I left message to call the pt to let him know when she receives the clearance notes, pt is anxious as his procedure is this Thursday with Dr. Laqueta.

## 2024-02-07 ENCOUNTER — Other Ambulatory Visit: Payer: Self-pay | Admitting: Family Medicine

## 2024-02-07 ENCOUNTER — Encounter: Payer: Self-pay | Admitting: Family Medicine

## 2024-02-07 DIAGNOSIS — R42 Dizziness and giddiness: Secondary | ICD-10-CM

## 2024-02-08 DIAGNOSIS — I4821 Permanent atrial fibrillation: Secondary | ICD-10-CM | POA: Diagnosis not present

## 2024-02-08 DIAGNOSIS — M810 Age-related osteoporosis without current pathological fracture: Secondary | ICD-10-CM | POA: Diagnosis not present

## 2024-02-08 DIAGNOSIS — B029 Zoster without complications: Secondary | ICD-10-CM | POA: Diagnosis not present

## 2024-02-08 DIAGNOSIS — I10 Essential (primary) hypertension: Secondary | ICD-10-CM | POA: Diagnosis not present

## 2024-02-08 DIAGNOSIS — E785 Hyperlipidemia, unspecified: Secondary | ICD-10-CM | POA: Diagnosis not present

## 2024-02-08 DIAGNOSIS — M5412 Radiculopathy, cervical region: Secondary | ICD-10-CM | POA: Diagnosis not present

## 2024-02-08 DIAGNOSIS — I2510 Atherosclerotic heart disease of native coronary artery without angina pectoris: Secondary | ICD-10-CM | POA: Diagnosis not present

## 2024-02-12 DIAGNOSIS — M1712 Unilateral primary osteoarthritis, left knee: Secondary | ICD-10-CM | POA: Diagnosis not present

## 2024-02-12 DIAGNOSIS — I2510 Atherosclerotic heart disease of native coronary artery without angina pectoris: Secondary | ICD-10-CM | POA: Diagnosis not present

## 2024-02-12 DIAGNOSIS — Z7901 Long term (current) use of anticoagulants: Secondary | ICD-10-CM | POA: Diagnosis not present

## 2024-02-12 DIAGNOSIS — M5430 Sciatica, unspecified side: Secondary | ICD-10-CM | POA: Diagnosis not present

## 2024-02-12 DIAGNOSIS — Z8551 Personal history of malignant neoplasm of bladder: Secondary | ICD-10-CM | POA: Diagnosis not present

## 2024-02-12 DIAGNOSIS — H538 Other visual disturbances: Secondary | ICD-10-CM | POA: Diagnosis not present

## 2024-02-12 DIAGNOSIS — I4821 Permanent atrial fibrillation: Secondary | ICD-10-CM | POA: Diagnosis not present

## 2024-02-12 DIAGNOSIS — M810 Age-related osteoporosis without current pathological fracture: Secondary | ICD-10-CM | POA: Diagnosis not present

## 2024-02-12 DIAGNOSIS — B029 Zoster without complications: Secondary | ICD-10-CM | POA: Diagnosis not present

## 2024-02-12 DIAGNOSIS — E785 Hyperlipidemia, unspecified: Secondary | ICD-10-CM | POA: Diagnosis not present

## 2024-02-12 DIAGNOSIS — Z951 Presence of aortocoronary bypass graft: Secondary | ICD-10-CM | POA: Diagnosis not present

## 2024-02-12 DIAGNOSIS — Z602 Problems related to living alone: Secondary | ICD-10-CM | POA: Diagnosis not present

## 2024-02-12 DIAGNOSIS — Z952 Presence of prosthetic heart valve: Secondary | ICD-10-CM | POA: Diagnosis not present

## 2024-02-12 DIAGNOSIS — G5600 Carpal tunnel syndrome, unspecified upper limb: Secondary | ICD-10-CM | POA: Diagnosis not present

## 2024-02-12 DIAGNOSIS — Z9181 History of falling: Secondary | ICD-10-CM | POA: Diagnosis not present

## 2024-02-12 DIAGNOSIS — Z8673 Personal history of transient ischemic attack (TIA), and cerebral infarction without residual deficits: Secondary | ICD-10-CM | POA: Diagnosis not present

## 2024-02-12 DIAGNOSIS — Z950 Presence of cardiac pacemaker: Secondary | ICD-10-CM | POA: Diagnosis not present

## 2024-02-12 DIAGNOSIS — I10 Essential (primary) hypertension: Secondary | ICD-10-CM | POA: Diagnosis not present

## 2024-02-12 DIAGNOSIS — I350 Nonrheumatic aortic (valve) stenosis: Secondary | ICD-10-CM | POA: Diagnosis not present

## 2024-02-12 DIAGNOSIS — K219 Gastro-esophageal reflux disease without esophagitis: Secondary | ICD-10-CM | POA: Diagnosis not present

## 2024-02-12 DIAGNOSIS — I4510 Unspecified right bundle-branch block: Secondary | ICD-10-CM | POA: Diagnosis not present

## 2024-02-16 DIAGNOSIS — I4821 Permanent atrial fibrillation: Secondary | ICD-10-CM | POA: Diagnosis not present

## 2024-02-16 DIAGNOSIS — I10 Essential (primary) hypertension: Secondary | ICD-10-CM | POA: Diagnosis not present

## 2024-02-16 DIAGNOSIS — M810 Age-related osteoporosis without current pathological fracture: Secondary | ICD-10-CM | POA: Diagnosis not present

## 2024-02-16 DIAGNOSIS — B029 Zoster without complications: Secondary | ICD-10-CM | POA: Diagnosis not present

## 2024-02-16 DIAGNOSIS — E785 Hyperlipidemia, unspecified: Secondary | ICD-10-CM | POA: Diagnosis not present

## 2024-02-16 DIAGNOSIS — I2510 Atherosclerotic heart disease of native coronary artery without angina pectoris: Secondary | ICD-10-CM | POA: Diagnosis not present

## 2024-02-21 DIAGNOSIS — I10 Essential (primary) hypertension: Secondary | ICD-10-CM | POA: Diagnosis not present

## 2024-02-21 DIAGNOSIS — I2510 Atherosclerotic heart disease of native coronary artery without angina pectoris: Secondary | ICD-10-CM | POA: Diagnosis not present

## 2024-02-21 DIAGNOSIS — E785 Hyperlipidemia, unspecified: Secondary | ICD-10-CM | POA: Diagnosis not present

## 2024-02-21 DIAGNOSIS — M810 Age-related osteoporosis without current pathological fracture: Secondary | ICD-10-CM | POA: Diagnosis not present

## 2024-02-21 DIAGNOSIS — I4821 Permanent atrial fibrillation: Secondary | ICD-10-CM | POA: Diagnosis not present

## 2024-02-21 DIAGNOSIS — B029 Zoster without complications: Secondary | ICD-10-CM | POA: Diagnosis not present

## 2024-02-22 ENCOUNTER — Other Ambulatory Visit: Payer: Self-pay | Admitting: Surgery

## 2024-02-22 DIAGNOSIS — I6523 Occlusion and stenosis of bilateral carotid arteries: Secondary | ICD-10-CM

## 2024-02-27 ENCOUNTER — Telehealth: Payer: Self-pay | Admitting: Family Medicine

## 2024-02-27 NOTE — Telephone Encounter (Signed)
 Copied from CRM [id number]. Topic: General - Other >> Feb 27, 2024 10:25 AM Berneda FALCON wrote: Reason for CRM: [facility] home health calling to make sure we received the orders they faxed over on 10/29 for home health. I let her know we did not see anything in the chart for this yet, but it may just be that it was not scanned in yet. Confirmed the fax number was correct and she is going to refax it just to be on the safe side. Please be on the lookout for this fax.

## 2024-02-28 NOTE — Telephone Encounter (Signed)
 I will await the incoming report.  Thanks.

## 2024-02-29 DIAGNOSIS — E785 Hyperlipidemia, unspecified: Secondary | ICD-10-CM | POA: Diagnosis not present

## 2024-02-29 DIAGNOSIS — I2510 Atherosclerotic heart disease of native coronary artery without angina pectoris: Secondary | ICD-10-CM | POA: Diagnosis not present

## 2024-02-29 DIAGNOSIS — I4821 Permanent atrial fibrillation: Secondary | ICD-10-CM | POA: Diagnosis not present

## 2024-02-29 DIAGNOSIS — M810 Age-related osteoporosis without current pathological fracture: Secondary | ICD-10-CM | POA: Diagnosis not present

## 2024-02-29 DIAGNOSIS — B029 Zoster without complications: Secondary | ICD-10-CM | POA: Diagnosis not present

## 2024-02-29 DIAGNOSIS — I10 Essential (primary) hypertension: Secondary | ICD-10-CM | POA: Diagnosis not present

## 2024-03-08 DIAGNOSIS — E785 Hyperlipidemia, unspecified: Secondary | ICD-10-CM | POA: Diagnosis not present

## 2024-03-08 DIAGNOSIS — I10 Essential (primary) hypertension: Secondary | ICD-10-CM | POA: Diagnosis not present

## 2024-03-08 DIAGNOSIS — I4821 Permanent atrial fibrillation: Secondary | ICD-10-CM | POA: Diagnosis not present

## 2024-03-08 DIAGNOSIS — B029 Zoster without complications: Secondary | ICD-10-CM | POA: Diagnosis not present

## 2024-03-08 DIAGNOSIS — I2510 Atherosclerotic heart disease of native coronary artery without angina pectoris: Secondary | ICD-10-CM | POA: Diagnosis not present

## 2024-03-08 DIAGNOSIS — M810 Age-related osteoporosis without current pathological fracture: Secondary | ICD-10-CM | POA: Diagnosis not present

## 2024-03-14 DIAGNOSIS — Z961 Presence of intraocular lens: Secondary | ICD-10-CM | POA: Diagnosis not present

## 2024-03-14 DIAGNOSIS — H532 Diplopia: Secondary | ICD-10-CM | POA: Diagnosis not present

## 2024-03-20 ENCOUNTER — Telehealth: Payer: Self-pay | Admitting: Family Medicine

## 2024-03-20 ENCOUNTER — Ambulatory Visit: Payer: Self-pay

## 2024-03-20 NOTE — Telephone Encounter (Signed)
 Copied from CRM #[id number]. Topic: Referral - Question >> Mar 20, 2024  1:30 PM Rea C wrote: Reason for CRM: Patient has not heard back from [facility] at [facility] and is wondering if another referral needs to be sent out. The referral in his chart shows pending review since 10/15, not released, not seen. Patient is still dealing with consistent dizziness and imbalance.   [phone] (M)

## 2024-03-20 NOTE — Telephone Encounter (Signed)
 Referral was placed 02/07/24.  Please process and contact patient.  Thanks.

## 2024-03-20 NOTE — Telephone Encounter (Signed)
 FYI Only or Action Required?: Action required by provider: referral request.  Patient was last seen in primary care on 12/28/2023 by Cleatus Arlyss RAMAN, MD.  Called Nurse Triage reporting Referral.  Symptoms began today.  Interventions attempted: Nothing.  Symptoms are: stable.Pt. States Dr. Cleatus made a referral to [facility] and no one has called me. Still having dizziness and balance issues. Please advise pt.  Triage Disposition: Call PCP Now  Patient/caregiver understands and will follow disposition?: Yes      Copied from CRM #[id number]. Topic: Clinical - Red Word Triage >> Mar 20, 2024  1:32 PM Rea C wrote: Red Word that prompted transfer to Nurse Triage: Consistent dizziness and imbalance Reason for Disposition  [1] Follow-up call from patient regarding patient's clinical status AND [2] information urgent  Answer Assessment - Initial Assessment Questions 1. REASON FOR CALL or QUESTION: What is your reason for calling today? or How can I best     Asking about referral for [facility] at [facility]. No one has contacted him. 2. CALLER: Document the source of call. (e.g., laboratory staff, caregiver or patient).     Patient  Protocols used: PCP Call - No Triage-A-AH

## 2024-03-20 NOTE — Telephone Encounter (Signed)
 Referral processed and sent to   [facility]ology and [facility] [address] [place], [place], [place] [address] Phone: [phone] Fax: [phone]  Patient made aware.

## 2024-03-26 ENCOUNTER — Encounter

## 2024-03-26 ENCOUNTER — Ambulatory Visit

## 2024-03-27 DIAGNOSIS — R42 Dizziness and giddiness: Secondary | ICD-10-CM | POA: Diagnosis not present

## 2024-03-29 ENCOUNTER — Ambulatory Visit

## 2024-03-29 DIAGNOSIS — I4821 Permanent atrial fibrillation: Secondary | ICD-10-CM | POA: Diagnosis not present

## 2024-04-01 ENCOUNTER — Ambulatory Visit: Admitting: Cardiology

## 2024-04-01 LAB — CUP PACEART REMOTE DEVICE CHECK
Battery Remaining Longevity: 156 mo
Battery Voltage: 3.09 V
Brady Statistic RV Percent Paced: 80.97 %
Date Time Interrogation Session: 20251205022656
Implantable Lead Connection Status: 753985
Implantable Lead Implant Date: 20241209
Implantable Lead Location: 753860
Implantable Lead Model: 5076
Implantable Pulse Generator Implant Date: 20241209
Lead Channel Impedance Value: 380 Ohm
Lead Channel Impedance Value: 513 Ohm
Lead Channel Pacing Threshold Amplitude: 0.625 V
Lead Channel Pacing Threshold Pulse Width: 0.4 ms
Lead Channel Sensing Intrinsic Amplitude: 14.75 mV
Lead Channel Sensing Intrinsic Amplitude: 14.75 mV
Lead Channel Setting Pacing Amplitude: 2 V
Lead Channel Setting Pacing Pulse Width: 0.4 ms
Lead Channel Setting Sensing Sensitivity: 2 mV
Zone Setting Status: 755011

## 2024-04-02 ENCOUNTER — Emergency Department (HOSPITAL_COMMUNITY)

## 2024-04-02 ENCOUNTER — Observation Stay (HOSPITAL_COMMUNITY)

## 2024-04-02 ENCOUNTER — Observation Stay (HOSPITAL_COMMUNITY)
Admission: EM | Admit: 2024-04-02 | Discharge: 2024-04-05 | DRG: 065 | Disposition: A | Discharge status: Skilled Nursing Facility | Attending: Emergency Medicine | Admitting: Emergency Medicine

## 2024-04-02 DIAGNOSIS — Z7901 Long term (current) use of anticoagulants: Secondary | ICD-10-CM | POA: Diagnosis not present

## 2024-04-02 DIAGNOSIS — M1712 Unilateral primary osteoarthritis, left knee: Secondary | ICD-10-CM | POA: Diagnosis not present

## 2024-04-02 DIAGNOSIS — I700 Atherosclerosis of aorta: Secondary | ICD-10-CM | POA: Diagnosis not present

## 2024-04-02 DIAGNOSIS — I10 Essential (primary) hypertension: Secondary | ICD-10-CM | POA: Diagnosis present

## 2024-04-02 DIAGNOSIS — E785 Hyperlipidemia, unspecified: Secondary | ICD-10-CM | POA: Diagnosis not present

## 2024-04-02 DIAGNOSIS — I4729 Other ventricular tachycardia: Secondary | ICD-10-CM | POA: Diagnosis present

## 2024-04-02 DIAGNOSIS — M160 Bilateral primary osteoarthritis of hip: Secondary | ICD-10-CM | POA: Diagnosis not present

## 2024-04-02 DIAGNOSIS — Z950 Presence of cardiac pacemaker: Secondary | ICD-10-CM | POA: Diagnosis not present

## 2024-04-02 DIAGNOSIS — I639 Cerebral infarction, unspecified: Secondary | ICD-10-CM | POA: Diagnosis not present

## 2024-04-02 DIAGNOSIS — K219 Gastro-esophageal reflux disease without esophagitis: Secondary | ICD-10-CM | POA: Diagnosis present

## 2024-04-02 DIAGNOSIS — I4891 Unspecified atrial fibrillation: Secondary | ICD-10-CM | POA: Diagnosis not present

## 2024-04-02 DIAGNOSIS — R9082 White matter disease, unspecified: Secondary | ICD-10-CM | POA: Diagnosis not present

## 2024-04-02 DIAGNOSIS — M25462 Effusion, left knee: Secondary | ICD-10-CM | POA: Diagnosis not present

## 2024-04-02 DIAGNOSIS — I6381 Other cerebral infarction due to occlusion or stenosis of small artery: Secondary | ICD-10-CM

## 2024-04-02 DIAGNOSIS — I350 Nonrheumatic aortic (valve) stenosis: Secondary | ICD-10-CM

## 2024-04-02 DIAGNOSIS — I25810 Atherosclerosis of coronary artery bypass graft(s) without angina pectoris: Secondary | ICD-10-CM | POA: Diagnosis not present

## 2024-04-02 DIAGNOSIS — I672 Cerebral atherosclerosis: Secondary | ICD-10-CM | POA: Diagnosis not present

## 2024-04-02 DIAGNOSIS — M25562 Pain in left knee: Secondary | ICD-10-CM | POA: Diagnosis not present

## 2024-04-02 DIAGNOSIS — Z8673 Personal history of transient ischemic attack (TIA), and cerebral infarction without residual deficits: Secondary | ICD-10-CM

## 2024-04-02 DIAGNOSIS — I4821 Permanent atrial fibrillation: Secondary | ICD-10-CM | POA: Diagnosis not present

## 2024-04-02 DIAGNOSIS — R42 Dizziness and giddiness: Principal | ICD-10-CM

## 2024-04-02 DIAGNOSIS — Z0389 Encounter for observation for other suspected diseases and conditions ruled out: Secondary | ICD-10-CM | POA: Diagnosis not present

## 2024-04-02 DIAGNOSIS — Z043 Encounter for examination and observation following other accident: Secondary | ICD-10-CM | POA: Diagnosis not present

## 2024-04-02 DIAGNOSIS — Z951 Presence of aortocoronary bypass graft: Secondary | ICD-10-CM | POA: Diagnosis not present

## 2024-04-02 DIAGNOSIS — Z952 Presence of prosthetic heart valve: Secondary | ICD-10-CM

## 2024-04-02 DIAGNOSIS — M7122 Synovial cyst of popliteal space [Baker], left knee: Secondary | ICD-10-CM | POA: Diagnosis not present

## 2024-04-02 DIAGNOSIS — I517 Cardiomegaly: Secondary | ICD-10-CM | POA: Diagnosis not present

## 2024-04-02 DIAGNOSIS — R29703 NIHSS score 3: Secondary | ICD-10-CM | POA: Diagnosis not present

## 2024-04-02 LAB — PROTIME-INR
INR: 1.3 — ABNORMAL HIGH (ref 0.8–1.2)
Prothrombin Time: 16.5 s — ABNORMAL HIGH (ref 11.4–15.2)

## 2024-04-02 LAB — COMPREHENSIVE METABOLIC PANEL WITH GFR
ALT: 15 U/L (ref 0–44)
AST: 28 U/L (ref 15–41)
Albumin: 3.9 g/dL (ref 3.5–5.0)
Alkaline Phosphatase: 62 U/L (ref 38–126)
Anion gap: 10 (ref 5–15)
BUN: 14 mg/dL (ref 8–23)
CO2: 29 mmol/L (ref 22–32)
Calcium: 9.3 mg/dL (ref 8.9–10.3)
Chloride: 101 mmol/L (ref 98–111)
Creatinine, Ser: 0.77 mg/dL (ref 0.61–1.24)
GFR, Estimated: >60 mL/min (ref >60)
Glucose, Bld: 99 mg/dL (ref 70–99)
Potassium: 3.3 mmol/L — ABNORMAL LOW (ref 3.5–5.1)
Sodium: 140 mmol/L (ref 135–145)
Total Bilirubin: 1.6 mg/dL — ABNORMAL HIGH (ref 0.0–1.2)
Total Protein: 7.1 g/dL (ref 6.5–8.1)

## 2024-04-02 LAB — CBC
HCT: 42.5 % (ref 39.0–52.0)
Hemoglobin: 14.5 g/dL (ref 13.0–17.0)
MCH: 33.9 pg (ref 26.0–34.0)
MCHC: 34.1 g/dL (ref 30.0–36.0)
MCV: 99.3 fL (ref 80.0–100.0)
Platelets: 135 K/uL — ABNORMAL LOW (ref 150–400)
RBC: 4.28 MIL/uL (ref 4.22–5.81)
RDW: 12.9 % (ref 11.5–15.5)
WBC: 7.5 K/uL (ref 4.0–10.5)
nRBC: 0 % (ref 0.0–0.2)

## 2024-04-02 LAB — CBG MONITORING, ED: Glucose-Capillary: 89 mg/dL (ref 70–99)

## 2024-04-02 LAB — URINALYSIS, ROUTINE W REFLEX MICROSCOPIC
Bilirubin Urine: NEGATIVE
Glucose, UA: NEGATIVE mg/dL
Ketones, ur: NEGATIVE mg/dL
Leukocytes,Ua: NEGATIVE
Nitrite: NEGATIVE
Protein, ur: NEGATIVE mg/dL
Specific Gravity, Urine: 1.011 (ref 1.005–1.030)
pH: 7 (ref 5.0–8.0)

## 2024-04-02 LAB — TROPONIN I (HIGH SENSITIVITY)
Troponin I (High Sensitivity): 37 ng/L — ABNORMAL HIGH (ref <18)
Troponin I (High Sensitivity): 36 ng/L — ABNORMAL HIGH (ref <18)

## 2024-04-02 LAB — MAGNESIUM: Magnesium: 2.1 mg/dL (ref 1.7–2.4)

## 2024-04-02 MED ORDER — PRAVASTATIN SODIUM 40 MG PO TABS
80.0000 mg | ORAL_TABLET | Freq: Every evening | ORAL | Status: DC
  Administered 2024-04-02 – 2024-04-04 (×3): 80 mg via ORAL
  Filled 2024-04-02 (×3): qty 2

## 2024-04-02 MED ORDER — STROKE: EARLY STAGES OF RECOVERY BOOK
Freq: Once | Status: AC
  Filled 2024-04-02: qty 1

## 2024-04-02 MED ORDER — APIXABAN 5 MG PO TABS
5.0000 mg | ORAL_TABLET | Freq: Two times a day (BID) | ORAL | Status: DC
  Administered 2024-04-02 – 2024-04-05 (×6): 5 mg via ORAL
  Filled 2024-04-02 (×6): qty 1

## 2024-04-02 MED ORDER — POTASSIUM CHLORIDE CRYS ER 20 MEQ PO TBCR
40.0000 meq | EXTENDED_RELEASE_TABLET | Freq: Two times a day (BID) | ORAL | Status: AC
  Administered 2024-04-02: 40 meq via ORAL
  Filled 2024-04-02: qty 2

## 2024-04-02 MED ORDER — ACETAMINOPHEN 160 MG/5ML PO SOLN: 650.0000 mg | ORAL | Status: DC | PRN

## 2024-04-02 MED ORDER — SODIUM CHLORIDE 0.9 % IV SOLN: INTRAVENOUS | Status: AC

## 2024-04-02 MED ORDER — ACETAMINOPHEN 325 MG PO TABS
650.0000 mg | ORAL_TABLET | ORAL | Status: DC | PRN
  Administered 2024-04-03: 650 mg via ORAL
  Filled 2024-04-02: qty 2

## 2024-04-02 MED ORDER — LATANOPROST 0.005 % OP SOLN
1.0000 [drp] | Freq: Every day | OPHTHALMIC | Status: DC
  Filled 2024-04-02: qty 2.5

## 2024-04-02 MED ORDER — HYDROMORPHONE HCL 1 MG/ML IJ SOLN
0.5000 mg | INTRAMUSCULAR | Status: AC | PRN
  Administered 2024-04-02 – 2024-04-03 (×2): 0.5 mg via INTRAVENOUS
  Filled 2024-04-02 (×3): qty 1

## 2024-04-02 MED ORDER — IOHEXOL 350 MG/ML SOLN
75.0000 mL | Freq: Once | INTRAVENOUS | Status: AC | PRN
  Administered 2024-04-02: 75 mL via INTRAVENOUS

## 2024-04-02 MED ORDER — LORAZEPAM 2 MG/ML IJ SOLN
0.5000 mg | Freq: Once | INTRAMUSCULAR | Status: AC | PRN
  Administered 2024-04-02: 0.5 mg via INTRAVENOUS
  Filled 2024-04-02: qty 1

## 2024-04-02 MED ORDER — ACETAMINOPHEN 650 MG RE SUPP: 650.0000 mg | RECTAL | Status: DC | PRN

## 2024-04-02 MED ORDER — LORAZEPAM 2 MG/ML IJ SOLN
1.0000 mg | Freq: Once | INTRAMUSCULAR | Status: AC
  Administered 2024-04-02: 1 mg via INTRAVENOUS
  Filled 2024-04-02: qty 1

## 2024-04-02 MED ORDER — BUTALBITAL-APAP-CAFFEINE 50-325-40 MG PO TABS: 1.0000 | ORAL_TABLET | Freq: Three times a day (TID) | ORAL | Status: DC | PRN

## 2024-04-02 MED ORDER — SENNOSIDES-DOCUSATE SODIUM 8.6-50 MG PO TABS: 1.0000 | ORAL_TABLET | Freq: Every evening | ORAL | Status: DC | PRN

## 2024-04-02 MED ORDER — MECLIZINE HCL 25 MG PO TABS
25.0000 mg | ORAL_TABLET | Freq: Once | ORAL | Status: AC
  Administered 2024-04-02: 25 mg via ORAL
  Filled 2024-04-02: qty 1

## 2024-04-02 MED ORDER — APIXABAN 5 MG PO TABS: 5.0000 mg | ORAL_TABLET | Freq: Two times a day (BID) | ORAL | Status: DC

## 2024-04-02 NOTE — Consult Note (Signed)
 NEUROLOGY CONSULT NOTE   Date of service: April 02, 2024 Patient Name: [name] [name] MRN:  [id number] DOB:  09/02/1934 Chief Complaint: stroke on MRI  Requesting Provider: Garrick Charleston, MD  History of Present Illness  [name] [name] is a 89 y.o. male with hx of atrial fibrillation on Eliquis , hypertension, CAD s/p CABG, s/p TAVR, history of CVA, hyperlipidemia, history of bladder cancer who presents to [facility] ED for evaluation of worsening dizziness and his left knee pain.  He states he has had dizziness for over a year and has had multiple workups in the past.  Patient states that he had been delivered food from [employer] and as he was going back into his house he felt off balance and dizzy and braced himself as he was going up the steps from his garage with the handrail and fell to the ground hitting his knee.  He states that he felt confused for a few moments afterwards. His main reason for coming in his left knee is swollen since hitting it on his brick steps and is having a difficult time walking and he wants the fluid drained from his knee. He denies any weakness, numbness, tingling, facial droop, slurred speech or vision changes   LKW: yesterday Modified rankin score: 3-Moderate disability-requires help but walks WITHOUT assistance IV Thrombolysis:  No outside window  EVT: NO LVO    NIHSS components Score: Comment  1a Level of Conscious 0[]  1[]  2[]  3[]      1b LOC Questions 0[]  1[]  2[]       1c LOC Commands 0[]  1[]  2[]       2 Best Gaze 0[]  1[]  2[]       3 Visual 0[]  1[]  2[]  3[]      4 Facial Palsy 0[]  1[]  2[]  3[]      5a Motor Arm - left 0[]  1[]  2[]  3[]  4[]  UN[]    5b Motor Arm - Right 0[]  1[]  2[]  3[]  4[]  UN[]    6a Motor Leg - Left 0[]  1[]  2[]  3[x]  4[]  UN[]  Limited mobility Due to knee pain   6b Motor Leg - Right 0[]  1[]  2[]  3[]  4[]  UN[]    7 Limb Ataxia 0[]  1[]  2[]  UN[]      8 Sensory 0[]  1[]  2[]  UN[]      9 Best Language 0[]  1[]  2[]  3[]      10 Dysarthria  0[]  1[]  2[]  UN[]      11 Extinct. and Inattention 0[]  1[]  2[]       TOTAL: 3      ROS  Comprehensive ROS performed and pertinent positives documented in HPI   Past History   Past Medical History:  Diagnosis Date   Arthritis    Atrial fibrillation, permanent (HCC)    Bladder neoplasm    Chronic cough    NO CARDIAC OR PULMONARY RELATED   Coronary artery disease CARDIOLOGIST-  DR KLEIN/ ALLRED   a. s/p CABG 2004, b. LHC with patent grafts 07/2005, ejection fraction 50%;  c. 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 Amiodarone  Rx   S/P CABG x 5 2004   S/P TAVR (transcatheter  aortic valve replacement) 11/15/2022   s/p TAVR with a 29mm Edwards S3UR via the TF approach by Dr. Wonda & Maryjane.   Severe aortic stenosis     Past Surgical History:  Procedure Laterality Date   CARDIAC CATHETERIZATION  07-26-2005  DR GAMBLE   PRESERVED LVF/  EF 50%/ PATENT GRAFTS   CARDIAC CATHETERIZATION  03-04-2003  DR GAMBLE   SEVERE 3 VESSEL DISEASE   CARDIAC CATHETERIZATION  1991  DR [facility]   PAF/ FALSE POSITIVE STRESS TEST   CAROTID ENDARTERECTOMY Left 2022   CATARACT EXTRACTION W/ INTRAOCULAR LENS IMPLANT Right    CORONARY ARTERY BYPASS GRAFT  03-08-2003  DR HZMYJMIU   LIMA TO LAD/ SVG TO OM2/  SVG TO DIAGONAL / SVG TO PDA & PLA   CYSTOSCOPY WITH BIOPSY N/A 12/25/2012   Procedure: CYSTOSCOPY WITH BLADDER BIOPSY;  Surgeon: Donnice Gwenyth Brooks, MD;  Location: [facility];  Service: Urology;  Laterality: N/A;   ELECTROPHYSIOLOGY STUDY  07-27-2005  DR DANELLE B[place]MINGHAM   MAPPING --   RESULT NONINDUCIBLE VT OR SVT/  DX RIGHT VENTRICULAR  OUTFLOW TRACT VT AND RULES OUT MORE MALIGNANT CAUSES OF VT   ENDARTERECTOMY Left 07/08/2020   Procedure: LEFT CAROTID ENDARTERECTOMY;  Surgeon: Serene Gaile ORN, MD;  Location: [facility] OR;  Service: Vascular;  Laterality: Left;   INGUINAL HERNIA REPAIR Bilateral 1954  &  1990   INTRAOPERATIVE TRANSTHORACIC ECHOCARDIOGRAM N/A 11/15/2022   Procedure: INTRAOPERATIVE TRANSTHORACIC ECHOCARDIOGRAM;  Surgeon: Wonda Sharper, MD;  Location: [facility] INVASIVE CV LAB;  Service: Open Heart Surgery;  Laterality: N/A;   KNEE ARTHROSCOPY Right 1994   LEFT HEART CATH AND CORS/GRAFTS ANGIOGRAPHY N/A 10/14/2022   Procedure: LEFT HEART CATH AND CORS/GRAFTS ANGIOGRAPHY;  Surgeon: Wonda Sharper, MD;  Location: [facility] INVASIVE CV LAB;  Service: Cardiovascular;  Laterality: N/A;   LUMBAR DISC SURGERY  1999   L4 -- L5   MOHS SURGERY     by Dr. Lyle 2019   PACEMAKER IMPLANT N/A 04/03/2023   Procedure: PACEMAKER IMPLANT;  Surgeon: Fernande Elspeth BROCKS, MD;  Location: [facility] INVASIVE CV LAB;  Service: Cardiovascular;  Laterality: N/A;   REMOVAL VOCAL CORD POLYPS  1978   TONSILLECTOMY  AS CHILD   TRANSCATHETER AORTIC VALVE REPLACEMENT, TRANSFEMORAL N/A 11/15/2022   Procedure: Transcatheter Aortic Valve Replacement, Transfemoral;  Surgeon: Wonda Sharper, MD;  Location: [facility] INVASIVE CV LAB;  Service: Open Heart Surgery;  Laterality: N/A;   TRANSTHORACIC ECHOCARDIOGRAM  08/08/2011   MILD LVH/  EF 55-60%/  GRADE I DIASTOLIC DYSFUNCTION/ MILD AV STENOSIS    Family History: Family History  Problem Relation Age of Onset   Cancer Mother    Stroke Neg Hx     Social History  reports that he quit smoking about 43 years ago. His smoking use included cigarettes. He started smoking about 68 years ago. He has a 25 pack-year smoking history. He has never used smokeless tobacco. He reports that he does not drink alcohol and does not use drugs.  Allergies  Allergen Reactions   Hydralazine  Hcl Other (See Comments)    Had severe headache all night.   Nightmares.  Drugged feeling.  Extreme dizziness   Ace Inhibitors Other (See Comments)    COUGH  Product containing angiotensin-converting enzyme inhibitor (product)   Zocor [Simvastatin] Other (See Comments)    MYALGIA   Nsaids Other (See Comments)    Would avoid given anticoagulation   Prednisone  Other (See Comments)    Prednisone  caused pt to have elevated  BP.   Cephalexin Rash   Chocolate Other (See Comments)    Headaches    Doxycycline  Other (See Comments)    Nausea and abd pain   Gabapentin  Other (See Comments)    Dizziness   Hycodan [Hydrocodone  Bit-Homatrop Mbr] Nausea And Vomiting   Oxycodone  Nausea Only    Pt states that he cannot take prescription pain medication    Medications  No current facility-administered medications for this encounter.  Current Outpatient Medications:    acetaminophen  (TYLENOL ) 500 MG tablet, Take 500-1,000 mg by mouth every 6 (six) hours as needed (pain.)., Disp: , Rfl:    apixaban  (ELIQUIS ) 5 MG TABS tablet, Take 1 tablet (5 mg total) by mouth 2 (two) times daily. Take evening dose starting 07/09/20, Disp: 180 tablet, Rfl: 3   butalbital -acetaminophen -caffeine  (FIORICET ) 50-325-40 MG tablet, Take 1 tablet by mouth 3 (three) times daily as needed for headache., Disp: , Rfl:    cetirizine  (ZYRTEC ) 10 MG tablet, Take 1 tablet (10 mg total) by mouth daily., Disp: , Rfl:    hydrochlorothiazide  (HYDRODIURIL ) 25 MG tablet, Take 1 tablet (25 mg total) by mouth daily., Disp: 90 tablet, Rfl: 3   Iron , Ferrous Sulfate , 325 (65 Fe) MG TABS, Take 325 mg by mouth daily., Disp: , Rfl:    latanoprost  (XALATAN ) 0.005 % ophthalmic solution, Place 1 drop into both eyes at bedtime., Disp: , Rfl:    Polyethyl Glycol-Propyl Glycol (SYSTANE ULTRA OP), Place 1-2 drops into both eyes once as needed (dry eyes.)., Disp: , Rfl:    potassium chloride  (KLOR-CON  M) 10 MEQ tablet, Take 10 mEq by mouth in the morning., Disp: , Rfl:    pravastatin  (PRAVACHOL ) 80 MG tablet,  Take 1 tablet (80 mg total) by mouth every evening., Disp: 90 tablet, Rfl: 0  Vitals   Vitals:   04/02/24 0841 04/02/24 1018 04/02/24 1030 04/02/24 1347  BP:  (!) 176/81 (!) 179/92 (!) 161/125  Pulse:  (!) 58 75 60  Resp:  13 17 18   Temp: 98 F (36.7 C)  98.1 F (36.7 C) 98.3 F (36.8 C)  TempSrc: Oral  Oral Oral  SpO2:  97% 97% 96%  Weight:      Height:        Body mass index is 24.36 kg/m.   Physical Exam   Constitutional: Appears well-developed and well-nourished.  Psych: Affect appropriate to situation.  Eyes: No scleral injection.  HENT: No OP obstruction.  Head: Normocephalic.  Cardiovascular: Normal rate and regular rhythm.  Respiratory: Effort normal, non-labored breathing.  GI: Soft.  No distension. There is no tenderness.  Skin: WDI.   Neurologic Examination   Mental Status -  Level of arousal and orientation to time, place, and person were intact. Language including expression, naming, repetition, comprehension was assessed and found intact. Attention span and concentration were normal. Recent and remote memory were intact. Fund of Knowledge was assessed and was intact.  Cranial Nerves II - XII - II - Visual field intact OU. III, IV, VI - Extraocular movements intact. V - Facial sensation intact bilaterally. VII - Facial movement intact bilaterally. VIII - Hearing & vestibular intact bilaterally. X - Palate elevates symmetrically. XI - Chin turning & shoulder shrug intact bilaterally. XII - Tongue protrusion intact.  Motor Strength - The patient's strength was normal in all extremities except left leg he is unable to lift off the bed due to knee pain, can wiggle toes  Bulk was normal and fasciculations were absent.   Motor  Tone - Muscle tone was assessed at the neck and appendages and was normal . Sensory - Light touch, temperature/pinprick were assessed and were symmetrical.   Coordination - The patient had normal movements in the hands and feet  with no ataxia or dysmetria.  Tremor was absent. Gait and Station - deferred.  Labs/Imaging/Neurodiagnostic studies   CBC:  Recent Labs  Lab 2024/04/24 0845  WBC 7.5  HGB 14.5  HCT 42.5  MCV 99.3  PLT 135*   Basic Metabolic Panel:  Lab Results  Component Value Date   NA 140 04/24/24   K 3.3 (L) 2024-04-24   CO2 29 2024/04/24   GLUCOSE 99 2024/04/24   BUN 14 Apr 24, 2024   CREATININE 0.77 24-Apr-2024   CALCIUM  9.3 04-24-24   GFRNONAA >60 04-24-24   GFRAA 83 09/02/2019   Lipid Panel:  Lab Results  Component Value Date   LDLCALC 89 01/12/2023   HgbA1c:  Lab Results  Component Value Date   HGBA1C 5.7 (H) 01/12/2023   Urine Drug Screen:     Component Value Date/Time   LABOPIA NONE DETECTED 03/10/2020 1733   COCAINSCRNUR NONE DETECTED 03/10/2020 1733   LABBENZ NONE DETECTED 03/10/2020 1733   AMPHETMU NONE DETECTED 03/10/2020 1733   THCU NONE DETECTED 03/10/2020 1733   LABBARB POSITIVE (A) 03/10/2020 1733    Alcohol Level     Component Value Date/Time   ETH <10 03/10/2020 1743   INR  Lab Results  Component Value Date   INR 1.3 (H) 2024/04/24   APTT  Lab Results  Component Value Date   APTT 101 (H) 11/17/2022    MRI Brain(Personally reviewed): 1. Punctate acute lacunar infarct in the left frontal white matter and 2 punctate acute lacunar infarcts in the right frontal white matter 2. Extensive chronic white matter disease    ASSESSMENT   [name] [name] is a 89 y.o. male  hx of atrial fibrillation on Eliquis , hypertension, CAD s/p CABG, s/p TAVR, history of CVA, hyperlipidemia, history of bladder cancer who presents to [facility] ED for evaluation of worsening dizziness and his left knee pain. MRI brain with punctate infarcts in bilateral frontal lobes.   RECOMMENDATIONS   - HgbA1c, fasting lipid panel - MRI of the brain without contrast - Frequent neuro checks - Echocardiogram - CTA head and neck - continue Eliquis   since taking him off of  anticoagulation may be more detrimental than the risk of bleed. - Risk factor modification - Telemetry monitoring - PT consult, OT consult, Speech consult - Stroke team to follow ______________________________________________________________________    Signed, Karna DELENA Geralds, NP [facility]   Attending Neurohospitalist Addendum Patient seen and examined with APP/Resident. Agree with the history and physical as documented above. Agree with the plan as documented, which I helped formulate. I have independently reviewed the chart, obtained history, review of systems and examined the patient.I have personally reviewed pertinent head/neck/spine imaging (CT/MRI). Please feel free to call with any questions.  -- Eligio Lav, MD Neurologist [facility] Pager: [phone]

## 2024-04-02 NOTE — ED Notes (Signed)
 Franke Menter, the patient's son [phone] is requesting an update.

## 2024-04-02 NOTE — Progress Notes (Signed)
 Remote PPM Transmission

## 2024-04-02 NOTE — Consult Note (Addendum)
 Cardiology Consultation   Patient ID: [name] [name] MRN: [id number]; DOB: 1934/12/04  Admit date: 04/02/2024 Date of Consult: 04/02/2024  PCP:  [name] Arlyss RAMAN, MD   [facility] Providers Cardiologist:  Oneil Parchment, MD  Electrophysiologist:  Will Gladis Norton, MD       Patient Profile: [name] [name] is a 89 y.o. male with a hx of  h/o permanent AF on chronic anticoagulation, VT - RVOT, AS s/p TAVR 10/2022 > LBBB/RBBB s/p PPM, CAD s/p CABG in 2004, micturition syncope, CVA, carotid disease s/p CEA, GERD, and hx of bladder cancer who is being seen 04/02/2024 for the evaluation of dizziness with elevated troponin at the request of Lamar Salen, MD.  History of Present Illness: [name] has medical history listed above who who has recently been having issues with orthostatic hypotension that is resulted in medication changes.  He also has dizziness that has been worked up by neurology and ENT. .Patient has went through vestibular rehab without improvement and was recommended by ENT to audiometric evaluation.  Patient was recently seen by Daphne Lever 01/03/2024 for EP follow-up.  At that time patient was doing well.  His dizziness is thought to be due to his BPPV.    Remote check 12/5 showed normal device function, VP. PVCs.  Presented to the ED today from home for chronic dizziness and left knee pain. In the ED BP: 155/89 ECG: V paced with LBBB VR 56 PPM was interrogated in the ED, RN helping to find report.   CXR unremarkable CT knee showed moderate to large joint effusion with osteoarthritis MR head showed 3 acute lacunar infarcts with extensive chronic white matter disease  Pertinent lab work: K 3.3  Mag 2.1 Troponin 37 -> 36  Patient has received IV ativan  and antivert  in the ED.  On interview, patient shared yesterday he was walking when he became dizzy causing his to slow fall to the ground, was able to brace himself. Denied chest pain,  shortness of breath, or palpitations. Reported dizziness worse with movement.  No recent illness.  Reported he did not take his morning medications.   Past Medical History:  Diagnosis Date   Arthritis    Atrial fibrillation, permanent (HCC)    Bladder neoplasm    Chronic cough    NO CARDIAC OR PULMONARY RELATED   Coronary artery disease CARDIOLOGIST-  DR KLEIN/ ALLRED   a. s/p CABG 2004, b. LHC with patent grafts 07/2005, ejection fraction 50%;  c. 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 Amiodarone  Rx   S/P CABG x 5 2004   S/P TAVR (transcatheter aortic valve replacement) 11/15/2022   s/p TAVR with a 29mm Edwards S3UR via the TF approach by Dr. Wonda & Maryjane.   Severe aortic stenosis     Past Surgical History:  Procedure Laterality Date   CARDIAC CATHETERIZATION  07-26-2005  DR GAMBLE   PRESERVED LVF/  EF 50%/ PATENT GRAFTS   CARDIAC CATHETERIZATION  03-04-2003  DR GAMBLE   SEVERE 3 VESSEL DISEASE   CARDIAC CATHETERIZATION  1991  DR MAYE  PAF/ FALSE POSITIVE STRESS TEST   CAROTID ENDARTERECTOMY Left 2022   CATARACT EXTRACTION W/ INTRAOCULAR LENS IMPLANT Right    CORONARY ARTERY BYPASS GRAFT  03-08-2003  DR [id number]   LIMA TO LAD/ SVG TO OM2/  SVG TO DIAGONAL / SVG TO PDA & PLA   CYSTOSCOPY WITH BIOPSY N/A 12/25/2012   Procedure: CYSTOSCOPY WITH BLADDER BIOPSY;  Surgeon: Donnice Gwenyth Brooks, MD;  Location: [facility];  Service: Urology;  Laterality: N/A;   ELECTROPHYSIOLOGY STUDY  07-27-2005  DR DANELLE BIRMINGHAM   MAPPING --   RESULT NONINDUCIBLE VT OR SVT/  DX RIGHT VENTRICULAR OUTFLOW TRACT VT AND RULES OUT  MORE MALIGNANT CAUSES OF VT   ENDARTERECTOMY Left 07/08/2020   Procedure: LEFT CAROTID ENDARTERECTOMY;  Surgeon: Serene Gaile ORN, MD;  Location: [facility] OR;  Service: Vascular;  Laterality: Left;   INGUINAL HERNIA REPAIR Bilateral 1954  &  1990   INTRAOPERATIVE TRANSTHORACIC ECHOCARDIOGRAM N/A 11/15/2022   Procedure: INTRAOPERATIVE TRANSTHORACIC ECHOCARDIOGRAM;  Surgeon: [name] Sharper, MD;  Location: [facility] INVASIVE CV LAB;  Service: Open Heart Surgery;  Laterality: N/A;   KNEE ARTHROSCOPY Right 1994   LEFT HEART CATH AND CORS/GRAFTS ANGIOGRAPHY N/A 10/14/2022   Procedure: LEFT HEART CATH AND CORS/GRAFTS ANGIOGRAPHY;  Surgeon: [name] Sharper, MD;  Location: [facility] INVASIVE CV LAB;  Service: Cardiovascular;  Laterality: N/A;   LUMBAR DISC SURGERY  1999   L4 -- L5   MOHS SURGERY     by Dr. Lyle 2019   PACEMAKER IMPLANT N/A 04/03/2023   Procedure: PACEMAKER IMPLANT;  Surgeon: Fernande Elspeth BROCKS, MD;  Location: [facility] INVASIVE CV LAB;  Service: Cardiovascular;  Laterality: N/A;   REMOVAL VOCAL CORD POLYPS  1978   TONSILLECTOMY  AS CHILD   TRANSCATHETER AORTIC VALVE REPLACEMENT, TRANSFEMORAL N/A 11/15/2022   Procedure: Transcatheter Aortic Valve Replacement, Transfemoral;  Surgeon: [name] Sharper, MD;  Location: [facility] INVASIVE CV LAB;  Service: Open Heart Surgery;  Laterality: N/A;   TRANSTHORACIC ECHOCARDIOGRAM  08/08/2011   MILD LVH/  EF 55-60%/  GRADE I DIASTOLIC DYSFUNCTION/ MILD AV STENOSIS       Scheduled Meds:  Continuous Infusions:  PRN Meds:   Allergies:    Allergies  Allergen Reactions   Hydralazine  Hcl Other (See Comments)    Had severe headache all night.  Nightmares.  Drugged feeling.  Extreme dizziness   Ace Inhibitors Other (See Comments)    COUGH  Product containing angiotensin-converting enzyme inhibitor (product)   Zocor [Simvastatin] Other (See Comments)    MYALGIA   Nsaids Other (See Comments)    Would avoid given anticoagulation   Prednisone  Other (See Comments)     Prednisone  caused pt to have elevated BP.   Cephalexin Rash   Chocolate Other (See Comments)    Headaches    Doxycycline  Other (See Comments)    Nausea and abd pain   Gabapentin  Other (See Comments)    Dizziness   Hycodan [Hydrocodone  Bit-Homatrop Mbr] Nausea And Vomiting   Oxycodone  Nausea Only    Pt states that he cannot take prescription pain medication    Social History:   Social History   Socioeconomic History   Marital status: Married    Spouse name: [name]   Number of children: 3   Years of education: Not on file   Highest education level: Not on file  Occupational History   Occupation: Retired  Tobacco Use   Smoking status: Former    Current packs/day: 0.00    Average packs/day: 1 pack/day for  25.0 years (25.0 ttl pk-yrs)    Types: Cigarettes    Start date: 04/26/1955    Quit date: 04/25/1980    Years since quitting: 43.9   Smokeless tobacco: Never  Vaping Use   Vaping status: Never Used  Substance and Sexual Activity   Alcohol use: No    Alcohol/week: 0.0 standard drinks of alcohol   Drug use: No   Sexual activity: Not on file  Other Topics Concern   Not on file  Social History Narrative   [employer]-'[employer], overseas to [place]   Some care through [place]   No service disability   Retired    Widowed 2025, was prev married 60+ years   Social Drivers of Corporate Investment Banker Strain: Low Risk  (02/13/2023)   Overall Financial Resource Strain (CARDIA)    Difficulty of Paying Living Expenses: Not hard at all  Food Insecurity: No Food Insecurity (02/13/2023)   Hunger Vital Sign    Worried About Running Out of Food in the Last Year: Never true    Ran Out of Food in the Last Year: Never true  Transportation Needs: No Transportation Needs (02/13/2023)   PRAPARE - Administrator, Civil Service (Medical): No    Lack of Transportation (Non-Medical): No  Physical Activity: Insufficiently Active (02/13/2023)   Exercise Vital Sign    Days of Exercise per  Week: 2 days    Minutes of Exercise per Session: 40 min  Stress: No Stress Concern Present (02/13/2023)   [employer] of Occupational Health - Occupational Stress Questionnaire    Feeling of Stress : Not at all  Social Connections: Moderately Integrated (02/13/2023)   Social Connection and Isolation Panel    Frequency of Communication with Friends and Family: Three times a week    Frequency of Social Gatherings with Friends and Family: Once a week    Attends Religious Services: 1 to 4 times per year    Active Member of [employer] or Organizations: No    Attends Banker Meetings: Never    Marital Status: Married  Catering Manager Violence: Not At Risk (02/13/2023)   Humiliation, Afraid, Rape, and Kick questionnaire    Fear of Current or Ex-Partner: No    Emotionally Abused: No    Physically Abused: No    Sexually Abused: No    Family History:   Family History  Problem Relation Age of Onset   Cancer Mother    Stroke Neg Hx      ROS:  Please see the history of present illness.  All other ROS reviewed and negative.     Physical Exam/Data: Vitals:   04/02/24 0841 04/02/24 1018 04/02/24 1030 04/02/24 1347  BP:  (!) 176/81 (!) 179/92 (!) 161/125  Pulse:  (!) 58 75 60  Resp:  13 17 18   Temp: 98 F (36.7 C)  98.1 F (36.7 C) 98.3 F (36.8 C)  TempSrc: Oral  Oral Oral  SpO2:  97% 97% 96%  Weight:      Height:       No intake or output data in the 24 hours ending 04/02/24 1359    04/02/2024    6:51 AM 01/03/2024   10:01 AM 01/01/2024    9:05 AM  Last 3 Weights  Weight (lbs) 169 lb 12.1 oz -- 170 lb  Weight (kg) 77 kg -- 77.111 kg     Body mass index is 24.36 kg/m.  General:  Well nourished, well developed, in no  acute distress HEENT: normal Neck: no JVD Vascular:  Distal pulses 2+ bilaterally Cardiac:  normal S1, S2; RRR; 1/6 murmur Lungs:  clear to auscultation bilaterally Abd: soft, non tender Ext: no pitting edema Skin: warm and dry  Psych:  Normal  affect   EKG:  The EKG was personally reviewed and demonstrates:  see hpi Telemetry:  Telemetry was personally reviewed and demonstrates:  VP HR ~60 though elevates to ~90. Frequent PVCs.  Relevant CV Studies: Echocardiogram 10/2023 IMPRESSIONS     1. Left ventricular ejection fraction, by estimation, is 65 to 70%. Left  ventricular ejection fraction by PLAX is 69 %. The left ventricle has  normal function. The left ventricle has no regional wall motion  abnormalities. There is moderate asymmetric  left ventricular hypertrophy. Left ventricular diastolic function could  not be evaluated.   2. Right ventricular systolic function is low normal. The right  ventricular size is normal. There is normal pulmonary artery systolic  pressure. The estimated right ventricular systolic pressure is 29.4 mmHg.   3. Left atrial size was severely dilated.   4. Right atrial size was moderately dilated.   5. The mitral valve is degenerative. Mild mitral valve regurgitation.  Moderate mitral annular calcification.   6. The aortic valve has been repaired/replaced. Aortic valve  regurgitation is mild. There is a 29 mm Sapien prosthetic (TAVR) valve  present in the aortic position. Procedure Date: 11/15/2022. Echo findings  are consistent with perivalvular leak of the  aortic prosthesis. Peak gradient 22.3 mmHg, DI is 0.64   7. Aortic dilatation noted. There is mild dilatation of the ascending  aorta, measuring 42 mm.   8. Mildly dilated pulmonary artery.   9. The inferior vena cava is normal in size with greater than 50%  respiratory variability, suggesting right atrial pressure of 3 mmHg.   Laboratory Data: High Sensitivity Troponin:   Recent Labs  Lab 04/02/24 0845 04/02/24 1102  TROPONINIHS 37* 36*     Chemistry Recent Labs  Lab 04/02/24 0845  NA 140  K 3.3*  CL 101  CO2 29  GLUCOSE 99  BUN 14  CREATININE 0.77  CALCIUM  9.3  MG 2.1  GFRNONAA >60  ANIONGAP 10    Recent Labs  Lab  04/02/24 0845  PROT 7.1  ALBUMIN 3.9  AST 28  ALT 15  ALKPHOS 62  BILITOT 1.6*   Hematology Recent Labs  Lab 04/02/24 0845  WBC 7.5  RBC 4.28  HGB 14.5  HCT 42.5  MCV 99.3  MCH 33.9  MCHC 34.1  RDW 12.9  PLT 135*   Radiology/Studies:  CT Knee Left Wo Contrast Result Date: 04/02/2024 CLINICAL DATA:  Fall.  Pain. EXAM: CT OF THE LEFT KNEE WITHOUT CONTRAST TECHNIQUE: Multidetector CT imaging of the left knee was performed according to the standard protocol. Multiplanar CT image reconstructions were also generated. RADIATION DOSE REDUCTION: This exam was performed according to the departmental dose-optimization program which includes automated exposure control, adjustment of the mA and/or kV according to patient size and/or use of iterative reconstruction technique. COMPARISON:  Left knee radiograph dated 01/02/2024. FINDINGS: Bones/Joint/Cartilage No acute fracture or dislocation. Moderate-to-large joint effusion. No evidence of lipohemarthrosis. Chondrocalcinosis in the medial and lateral femorotibial compartments. Tricompartmental osteoarthritis with mild joint space narrowing and osteophytosis. Ligaments Ligaments are suboptimally evaluated by CT. Muscles and Tendons A Baker's cyst is noted. Quadriceps and patellar tendons appear intact. Soft tissue No loculated fluid collection. Peripheral vascular calcifications are noted. IMPRESSION: 1. No acute  fracture or dislocation. 2. Moderate-to-large knee joint effusion. No evidence of lipohemarthrosis. 3. Tricompartmental osteoarthritis chondrocalcinosis in the medial and lateral compartments. Electronically Signed   By: Harrietta Sherry M.D.   On: 04/02/2024 13:39   MR BRAIN WO CONTRAST Result Date: 04/02/2024 CLINICAL DATA:  Syncope/presyncope EXAM: MRI HEAD WITHOUT CONTRAST TECHNIQUE: Multiplanar, multiecho pulse sequences of the brain and surrounding structures were obtained without intravenous contrast. COMPARISON:  August 02, 2023 FINDINGS:  MRI brain: There is a punctate focus of restricted diffusion in the left frontal white matter and there are 2 punctate foci of restricted diffusion in the right frontal white matter. There are extensive and confluent foci of T2 hyperintensity in the cerebral white matter. These do not have restricted diffusion. The ventricles are normal. No mass lesion. There are normal flow signals in the carotid arteries and basilar artery. No significant bone marrow signal abnormality. No significant abnormality in the paranasal sinuses or soft tissues. IMPRESSION: 1. Punctate acute lacunar infarct in the left frontal white matter and 2 punctate acute lacunar infarcts in the right frontal white matter 2. Extensive chronic white matter disease Electronically Signed   By: Nancyann Burns M.D.   On: 04/02/2024 13:15   DG Chest 2 View Result Date: 04/02/2024 EXAM: 2 VIEW(S) XRAY OF THE CHEST 04/02/2024 08:03:00 AM COMPARISON: 04/03/2023 CLINICAL HISTORY: dizziness FINDINGS: LUNGS AND PLEURA: No focal pulmonary opacity. No pleural effusion. No pneumothorax. HEART AND MEDIASTINUM: Mild cardiomegaly. Aortic valve prosthesis noted. CABG noted. Left chest single lead pacemaker noted. Aortic arch calcifications. BONES AND SOFT TISSUES: Sternotomy wires noted. Thoracic spondylosis. No acute osseous abnormality. IMPRESSION: 1. No acute findings. 2. Mild cardiomegaly with aortic valve prosthesis, CABG, and left chest single lead pacemaker. Electronically signed by: Evalene Coho MD 04/02/2024 08:30 AM EST RP Workstation: [id number]   DG Hip Unilat W or Wo Pelvis 2-3 Views Left Result Date: 04/02/2024 CLINICAL DATA:  Status post fall EXAM: DG HIP (WITH OR WITHOUT PELVIS) 3V LEFT COMPARISON:  None Available. FINDINGS: There is no evidence of hip fracture or dislocation. Mild degenerative changes of the hips. IMPRESSION: No acute fracture or dislocation. Mild degenerative changes of the hips. Electronically Signed   By: Limin  Xu M.D.    On: 04/02/2024 08:29   DG Knee Complete 4 Views Left Result Date: 04/02/2024 EXAM: 4 OR MORE VIEW(S) XRAY OF THE LEFT KNEE 04/02/2024 08:03:00 AM COMPARISON: None available. CLINICAL HISTORY: fall FINDINGS: BONES AND JOINTS: No acute fracture. No malalignment. There is tricompartmental osteoarthritis. There are calcifications present within the medial meniscus. There is a moderate suprapatellar bursal effusion. SOFT TISSUES: There are moderate atheromatous calcifications present. IMPRESSION: 1. 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 2. Moderate suprapatellar bursal effusion. Electronically signed by: Evalene Coho MD 04/02/2024 08:29 AM EST RP Workstation: [id number]   CT HEAD WO CONTRAST ( ) Result Date: 04/02/2024 EXAM: CT HEAD WITHOUT CONTRAST 04/02/2024 07:29:16 AM TECHNIQUE: CT of the head was performed without the administration of intravenous contrast. Automated exposure control, iterative reconstruction, and/or weight based adjustment of the mA/kV was utilized to reduce the radiation dose to as low as reasonably achievable. COMPARISON: CT of the head dated 01/09/2023. CLINICAL HISTORY: Syncope/presyncope, cerebrovascular cause suspected. FINDINGS: BRAIN AND VENTRICLES: No acute hemorrhage. No evidence of acute infarct. No hydrocephalus. No extra-axial collection. No mass effect or midline shift. There is age-related atrophy and moderate diffuse cerebral white matter disease. There are senescent calcifications within the basal ganglia bilaterally. There are atheromatous calcifications within the carotid siphons  and vertebral arteries. ORBITS: No acute abnormality. The patient is status post bilateral lens replacement. SINUSES: No acute abnormality. SOFT TISSUES AND SKULL: No acute soft tissue abnormality. No skull fracture. IMPRESSION: 1. No acute intracranial abnormality. 2. Age-related atrophy and moderate diffuse cerebral white matter disease. Electronically  signed by: Evalene Coho MD 04/02/2024 07:32 AM EST RP Workstation: [id number]     Assessment and Plan:  Dizziness Chronic problem > 1 year. Patient has been worked up by outpatient cardiology, follows with EP. Has been seen by neurology and ENT as well. Referred to balance center a [facility] Patient reported he has an appointment with the vestibular center at [facility]  ECG without acute ischemic findings.  Denied chest pain and palpitations.  PPM  interrogation pending, though based on recent remote check and telemetry do no suspect arrhythmogenic.   Given symptomatology, chronicity of dizziness, and acute CVA do not suspect his dizziness is cardiac in nature.  Would continue cardiac telemetry. Reached out to ER RN to follow up on PPM interrogation.  Will order orthostatic vitals given history of orthostatic hypotension.  Echocardiogram pending.  Elevated Troponin Trend as above. Mildly elevated and flat, most likely 2/2 acute CVA.   Permanent atrial fibrillation Alternating BBB s/p TAVR s/p PPM Currently in VP Chad Vas score 6  Patient missed am dose, spoke with neurology will given dose now.  Continue eliquis  5 mg BID  PVC RVOT VT Patient followed with EP. Asymptomatic.  Keep K > 4 and Mag > 2 Will replete K   Hyperlipidemia Will repeat lipid panel in am Restart PTA pravastatin  80 mg every evening  Hypertension BP: 161/125, highest 194/93 Patient had been experiencing orthostatic hypotension this year leading to the removal of several antihypertensives  PTA hydrochlorothiazide  25 mg on hold. Per neurology would like to have permissive hypertension for 24 hours, though SBP < 180 given chronic anticoagulation. Continue prn for now.  CAD s/p CABG in 2004  LHC in 2024 showing patient grafts Denied chest pain Chronic anticoagulation as above.   Carotid Artery disease s/p L CEA in 2022 Carotid US  2024: bilateral disease 1-39% Antiplatelet deferred 2/2 chronic  anticoagulation  AS s/p TAVR Recent echo 10/2023 showed valve in position with perivalvular leak. DI 0.64. Will follow up on echocardiogram, though do not suspect it is contributing to current presentation.   Per primary Dizziness CVA GERD  Risk Assessment/Risk Scores:       CHA2DS2-VASc Score = 6   This indicates a 9.7% annual risk of stroke. The patient's score is based upon: CHF History: 0 HTN History: 1 Diabetes History: 0 Stroke History: 2 Vascular Disease History: 1 Age Score: 2 Gender Score: 0        For questions or updates, please contact [facility] Please consult [email or web address] for contact info under      Signed, Leontine LOISE Salen, PA-C  04/02/2024 1:59 PM

## 2024-04-02 NOTE — ED Provider Notes (Signed)
 [place] EMERGENCY DEPARTMENT AT [facility] Provider Note   CSN: [id number] Arrival date & time: 04/02/24  9358     Patient presents with: Dizziness   [name] [name] is a 89 y.o. male.   89 y.o male with a PMH of HTN, CABG x 5, CAD, TAVR presents to the ED with a chief complaint of dizziness x 1 year. He reports this has been ongoing for the past year and worsen since he had this aortic replacement. Patient tells me he was concerned as was grabbing his food from [employer], he suddenly began to feel like he was about to fall off.  He reports he braced himself to the ground and scraped his left knee on the floor.  He reports that he felt very disoriented after this episode happened.  This does feel like his vertigo which came on suddenly as well.  He tells me that he is now having severe pain to the left leg which feels like there is a lot of fluid on.  He reports he has not slept all night due to pain in his leg along with his feeling of dizziness.  He has been seen by multiple specialists including neurology, cardiology, ENT and is supposed to follow-up with the vestibular clinic at [facility] in the month of March.  He is denying any chest pain, no shortness of breath, no fever, no abdominal pain, no headache.  The history is provided by the patient.  Dizziness Quality:  Head spinning and imbalance Severity:  Severe Onset quality:  Sudden Duration:  1 day Timing:  Sporadic Progression:  Waxing and waning Chronicity:  Recurrent Context: standing up   Relieved by:  Being still Worsened by:  Movement Ineffective treatments:  None tried Associated symptoms: weakness   Associated symptoms: no diarrhea, no headaches, no nausea, no palpitations, no shortness of breath, no syncope, no vision changes and no vomiting        Prior to Admission medications   Medication Sig Start Date End Date Taking? Authorizing Provider  acetaminophen  (TYLENOL ) 500 MG tablet Take  500-1,000 mg by mouth every 6 (six) hours as needed (pain.).   Yes [provider]  apixaban  (ELIQUIS ) 5 MG TABS tablet Take 1 tablet (5 mg total) by mouth 2 (two) times daily. Take evening dose starting 07/09/20 07/09/20  Yes Baglia, Corrina, PA-C  butalbital -acetaminophen -caffeine  (FIORICET ) 50-325-40 MG tablet Take 1 tablet by mouth 3 (three) times daily as needed for headache. 01/03/24  Yes [provider]  cetirizine  (ZYRTEC ) 10 MG tablet Take 1 tablet (10 mg total) by mouth daily. 10/29/21  Yes Cleatus Arlyss RAMAN, MD  hydrochlorothiazide  (HYDRODIURIL ) 25 MG tablet Take 1 tablet (25 mg total) by mouth daily. 10/04/23 04/02/24 Yes Ollis, Brandi L, NP  Iron , Ferrous Sulfate , 325 (65 Fe) MG TABS Take 325 mg by mouth daily. 03/26/23  Yes Cleatus Arlyss RAMAN, MD  latanoprost  (XALATAN ) 0.005 % ophthalmic solution Place 1 drop into both eyes at bedtime. 07/01/20  Yes [provider]  Polyethyl Glycol-Propyl Glycol (SYSTANE ULTRA OP) Place 1-2 drops into both eyes once as needed (dry eyes.).   Yes [provider]  potassium chloride  (KLOR-CON  M) 10 MEQ tablet Take 10 mEq by mouth in the morning.   Yes [provider]  pravastatin  (PRAVACHOL ) 80 MG tablet Take 1 tablet (80 mg total) by mouth every evening. 01/01/21  Yes Fernande Elspeth BROCKS, MD    Allergies: Hydralazine  hcl, Ace inhibitors, Zocor [simvastatin], Nsaids, Prednisone ,  Cephalexin, Chocolate, Doxycycline , Gabapentin , Hycodan [hydrocodone  bit-homatrop mbr], and Oxycodone     Review of Systems  Constitutional:  Negative for chills and fever.  Eyes:  Negative for photophobia and visual disturbance.  Respiratory:  Negative for shortness of breath.   Cardiovascular:  Negative for palpitations and syncope.  Gastrointestinal:  Negative for abdominal pain, diarrhea, nausea and vomiting.  Neurological:  Positive for dizziness and weakness. Negative for headaches.  All other systems reviewed and are negative.   Updated  Vital Signs BP (!) 161/125 (BP Location: Left Arm)   Pulse 60   Temp 98.3 F (36.8 C) (Oral)   Resp 18   Ht 5' 10 (1.778 m)   Wt 77 kg   SpO2 96%   BMI 24.36 kg/m   Physical Exam Vitals and nursing note reviewed.  Constitutional:      Appearance: Normal appearance.  HENT:     Head: Normocephalic and atraumatic.     Nose: Nose normal.  Eyes:     Extraocular Movements: Extraocular movements intact.  Cardiovascular:     Rate and Rhythm: Normal rate.  Pulmonary:     Effort: Pulmonary effort is normal.     Breath sounds: No wheezing.  Abdominal:     General: Abdomen is flat.     Palpations: Abdomen is soft.     Tenderness: There is no abdominal tenderness.  Musculoskeletal:        General: Swelling present.     Cervical back: Normal range of motion and neck supple.     Left knee: Swelling present. Normal range of motion. Tenderness present over the patellar tendon.  Skin:    General: Skin is warm and dry.  Neurological:     Mental Status: He is alert and oriented to person, place, and time.     (all labs ordered are listed, but only abnormal results are displayed) Labs Reviewed  COMPREHENSIVE METABOLIC PANEL WITH GFR - Abnormal; Notable for the following components:      Result Value   Potassium 3.3 (*)    Total Bilirubin 1.6 (*)    All other components within normal limits  CBC - Abnormal; Notable for the following components:   Platelets 135 (*)    All other components within normal limits  URINALYSIS, ROUTINE W REFLEX MICROSCOPIC - Abnormal; Notable for the following components:   Hgb urine dipstick SMALL (*)    Bacteria, UA RARE (*)    All other components within normal limits  PROTIME-INR - Abnormal; Notable for the following components:   Prothrombin Time 16.5 (*)    INR 1.3 (*)    All other components within normal limits  TROPONIN I (HIGH SENSITIVITY) - Abnormal; Notable for the following components:   Troponin I (High Sensitivity) 37 (*)    All other  components within normal limits  TROPONIN I (HIGH SENSITIVITY) - Abnormal; Notable for the following components:   Troponin I (High Sensitivity) 36 (*)    All other components within normal limits  MAGNESIUM   CBG MONITORING, ED    EKG: EKG Interpretation Date/Time:  Tuesday April 02 2024 08:38:16 EST Ventricular Rate:  56 PR Interval:    QRS Duration:  187 QT Interval:  580 QTC Calculation: 560 R Axis:   -83  Text Interpretation: VENTRICULAR PACED RHYTHM Confirmed by Garrick Charleston [phone]) on 04/02/2024 9:42:25 AM  Radiology: CT Knee Left Wo Contrast Result Date: 04/02/2024 CLINICAL DATA:  Fall.  Pain. EXAM: CT OF THE LEFT KNEE WITHOUT CONTRAST TECHNIQUE: Multidetector CT  imaging of the left knee was performed according to the standard protocol. Multiplanar CT image reconstructions were also generated. RADIATION DOSE REDUCTION: This exam was performed according to the departmental dose-optimization program which includes automated exposure control, adjustment of the mA and/or kV according to patient size and/or use of iterative reconstruction technique. COMPARISON:  Left knee radiograph dated 01/02/2024. FINDINGS: Bones/Joint/Cartilage No acute fracture or dislocation. Moderate-to-large joint effusion. No evidence of lipohemarthrosis. Chondrocalcinosis in the medial and lateral femorotibial compartments. Tricompartmental osteoarthritis with mild joint space narrowing and osteophytosis. Ligaments Ligaments are suboptimally evaluated by CT. Muscles and Tendons A Baker's cyst is noted. Quadriceps and patellar tendons appear intact. Soft tissue No loculated fluid collection. Peripheral vascular calcifications are noted. IMPRESSION: 1. No acute fracture or dislocation. 2. Moderate-to-large knee joint effusion. No evidence of lipohemarthrosis. 3. Tricompartmental osteoarthritis chondrocalcinosis in the medial and lateral compartments. Electronically Signed   By: Harrietta Sherry M.D.   On:  04/02/2024 13:39   MR BRAIN WO CONTRAST Result Date: 04/02/2024 CLINICAL DATA:  Syncope/presyncope EXAM: MRI HEAD WITHOUT CONTRAST TECHNIQUE: Multiplanar, multiecho pulse sequences of the brain and surrounding structures were obtained without intravenous contrast. COMPARISON:  August 02, 2023 FINDINGS: MRI brain: There is a punctate focus of restricted diffusion in the left frontal white matter and there are 2 punctate foci of restricted diffusion in the right frontal white matter. There are extensive and confluent foci of T2 hyperintensity in the cerebral white matter. These do not have restricted diffusion. The ventricles are normal. No mass lesion. There are normal flow signals in the carotid arteries and basilar artery. No significant bone marrow signal abnormality. No significant abnormality in the paranasal sinuses or soft tissues. IMPRESSION: 1. Punctate acute lacunar infarct in the left frontal white matter and 2 punctate acute lacunar infarcts in the right frontal white matter 2. Extensive chronic white matter disease Electronically Signed   By: Nancyann Burns M.D.   On: 04/02/2024 13:15   DG Chest 2 View Result Date: 04/02/2024 EXAM: 2 VIEW(S) XRAY OF THE CHEST 04/02/2024 08:03:00 AM COMPARISON: 04/03/2023 CLINICAL HISTORY: dizziness FINDINGS: LUNGS AND PLEURA: No focal pulmonary opacity. No pleural effusion. No pneumothorax. HEART AND MEDIASTINUM: Mild cardiomegaly. Aortic valve prosthesis noted. CABG noted. Left chest single lead pacemaker noted. Aortic arch calcifications. BONES AND SOFT TISSUES: Sternotomy wires noted. Thoracic spondylosis. No acute osseous abnormality. IMPRESSION: 1. No acute findings. 2. Mild cardiomegaly with aortic valve prosthesis, CABG, and left chest single lead pacemaker. Electronically signed by: Evalene Coho MD 04/02/2024 08:30 AM EST RP Workstation: [id number]   DG Hip Unilat W or Wo Pelvis 2-3 Views Left Result Date: 04/02/2024 CLINICAL DATA:  Status post fall  EXAM: DG HIP (WITH OR WITHOUT PELVIS) 3V LEFT COMPARISON:  None Available. FINDINGS: There is no evidence of hip fracture or dislocation. Mild degenerative changes of the hips. IMPRESSION: No acute fracture or dislocation. Mild degenerative changes of the hips. Electronically Signed   By: Limin  Xu M.D.   On: 04/02/2024 08:29   DG Knee Complete 4 Views Left Result Date: 04/02/2024 EXAM: 4 OR MORE VIEW(S) XRAY OF THE LEFT KNEE 04/02/2024 08:03:00 AM COMPARISON: None available. CLINICAL HISTORY: fall FINDINGS: BONES AND JOINTS: No acute fracture. No malalignment. There is tricompartmental osteoarthritis. There are calcifications present within the medial meniscus. There is a moderate suprapatellar bursal effusion. SOFT TISSUES: There are moderate atheromatous calcifications present. IMPRESSION: 1. 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 2. Moderate suprapatellar bursal effusion. Electronically signed by: Evalene Coho MD  04/02/2024 08:29 AM EST RP Workstation: [id number]   CT HEAD WO CONTRAST ( ) Result Date: 04/02/2024 EXAM: CT HEAD WITHOUT CONTRAST 04/02/2024 07:29:16 AM TECHNIQUE: CT of the head was performed without the administration of intravenous contrast. Automated exposure control, iterative reconstruction, and/or weight based adjustment of the mA/kV was utilized to reduce the radiation dose to as low as reasonably achievable. COMPARISON: CT of the head dated 01/09/2023. CLINICAL HISTORY: Syncope/presyncope, cerebrovascular cause suspected. FINDINGS: BRAIN AND VENTRICLES: No acute hemorrhage. No evidence of acute infarct. No hydrocephalus. No extra-axial collection. No mass effect or midline shift. There is age-related atrophy and moderate diffuse cerebral white matter disease. There are senescent calcifications within the basal ganglia bilaterally. There are atheromatous calcifications within the carotid siphons and vertebral arteries. ORBITS: No acute  abnormality. The patient is status post bilateral lens replacement. SINUSES: No acute abnormality. SOFT TISSUES AND SKULL: No acute soft tissue abnormality. No skull fracture. IMPRESSION: 1. No acute intracranial abnormality. 2. Age-related atrophy and moderate diffuse cerebral white matter disease. Electronically signed by: Evalene Coho MD 04/02/2024 07:32 AM EST RP Workstation: [id number]     Procedures   Medications Ordered in the ED  butalbital -acetaminophen -caffeine  (FIORICET ) 50-325-40 MG per tablet 1 tablet (has no administration in time range)  pravastatin  (PRAVACHOL ) tablet 80 mg (has no administration in time range)  apixaban  (ELIQUIS ) tablet 5 mg (has no administration in time range)  latanoprost  (XALATAN ) 0.005 % ophthalmic solution 1 drop (has no administration in time range)   stroke: early stages of recovery book (has no administration in time range)  0.9 %  sodium chloride  infusion (has no administration in time range)  acetaminophen  (TYLENOL ) tablet 650 mg (has no administration in time range)    Or  acetaminophen  (TYLENOL ) 160 MG/5ML solution 650 mg (has no administration in time range)    Or  acetaminophen  (TYLENOL ) suppository 650 mg (has no administration in time range)  senna-docusate (Senokot-S) tablet 1 tablet (has no administration in time range)  meclizine  (ANTIVERT ) tablet 25 mg (25 mg Oral Given 04/02/24 1048)  LORazepam  (ATIVAN ) injection 1 mg (1 mg Intravenous Given 04/02/24 1142)                                    Medical Decision Making Amount and/or Complexity of Data Reviewed Labs: ordered. Radiology: ordered.  Risk Prescription drug management. Decision regarding hospitalization.     This patient presents to the ED for concern of dizziness, this involves a number of treatment options, and is a complaint that carries with it a high risk of complications and morbidity.  The differential diagnosis includes ACS, CVA, electrolyte derangement  versus vertigo.   Co morbidities: Discussed in HPI   Brief History:  See HPI.   EMR reviewed including pt PMHx, past surgical history and past visits to ER.   See HPI for more details  Lab Tests:  I ordered and independently interpreted labs.  The pertinent results include:    I personally reviewed all laboratory work and imaging. Metabolic panel without any acute abnormality specifically kidney function within normal limits and no significant electrolyte abnormalities. CBC without leukocytosis or significant anemia.  Magnesium  level is normal.  CBG is 89.  UA with small hemoglobin, rare bacteria and no urinary symptoms at this time.   Imaging Studies:  CT head without any acute findings. X-ray of the chest without any acute findings. Left hip x-ray without any acute  findings.  Xray left knee IMPRESSION:  1. 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  2. Moderate suprapatellar bursal effusion.   MRI Brain: IMPRESSION:  1. Punctate acute lacunar infarct in the left frontal white matter  and 2 punctate acute lacunar infarcts in the right frontal white  matter  2. Extensive chronic white matter disease   Cardiac Monitoring:  EKG non-ischemic   Medicines ordered:  I ordered medication including meclizine , Ativan  for dizziness Reevaluation of the patient after these medicines showed that the patient improved I have reviewed the patients home medicines and have made adjustments as needed  Consults:  1:00 PM troponin of 36, call placed to cardiology for further recommendations. 2:06 PM  I requested consultation with Dr. Deedra of neurology,  and discussed lab and imaging findings as well as pertinent plan - they recommend: Admission via medicine, they will consult while patient is admitted.  Reevaluation:  After the interventions noted above I re-evaluated patient and found that they have :improved  Social Determinants of  Health:  The patient's social determinants of health were a factor in the care of this patient  Problem List / ED Course:  Patient presents to the ED with a chief complaint of left knee pain along with dizziness.  Here tells me that his dizziness has been ongoing for several months.  He has previously been evaluated by neurology, cardiology, ENT along with vestibular clinic but they have been unable to determine what the dizziness is coming from.  He does take meclizine .  Reports that yesterday the episode was not sudden onset.  This caused him to go to the ground.  He also tells me that he has had a hard time with ambulating especially feeling that his left leg is somewhat weaker.  On exam there is a palpable left knee effusion along with a small abrasion noted below the patella.  No open wound noted. He does move all upper and lower extremities.  He is ANO x 4, does have a pacemaker in place with an underlying history of TAVR.  He is not having any chest pain on today's visit.  His blood work was interpreted by me.  CMP with slight decrease in potassium, no electrolyte findings to account for his symptoms.  CBG is 89.  Magnesium  level is normal.  CBC with no leukocytosis.  Given Antivert  while in the ED with improvement in his symptoms. Urinalysis with no nitrates or leukocytes to suggest infection worsening his ambulation.  Troponin is elevated at 37, will obtain delta.  I did call cardiology seeing as patient does have underlying cardiac history.  However, patient does not have any chest pain at this time just more so weakness. Imaging of his left hip, left knee without acute findings.  There is a large effusion noted to the left knee, orthopedic surgery has been consulted. His CT head is without any acute findings aside for some white matter disease.  Patient does tell me that he had a previous history of strokes, he also tells me that is harder for him to ambulate and felt like he had to try 3 times  in order to stand up and take a couple steps.  There is some concern for acute CVA.  MRI brain has been ordered. MRI brain does show some small punctuate infarcts, likely consistent with his left leg weakness.  I consulted this case with neurology on-call Dr. Carlee will evaluate patient while in the hospital.  I have discussed all  findings and results with patient at this time, I do feel that he warrants admission for further management of his acute infarct.  He is agreeable of plan and treatment at this time.  Call placed for hospitalist service. Spoke to Dr. Melvin, who will admit patient for further management.    Dispostion:  After consideration of the diagnostic results and the patients response to treatment, I feel that the patent would benefit from admission for further management of his infarct.    Portions of this note were generated with Scientist, clinical (histocompatibility and immunogenetics). Dictation errors may occur despite best attempts at proofreading.   Final diagnoses:  Dizziness  Left sided lacunar infarction [facility])    ED Discharge Orders     None          Maureen Broad, PA-C 04/02/24 1450    Garrick Charleston, MD 04/02/24 1514

## 2024-04-02 NOTE — H&P (Signed)
 History and Physical   [name] [name] FMW:[id number] DOB: 1934/09/27 DOA: 04/02/2024  PCP: Cleatus Arlyss RAMAN, MD   Patient coming from: Home  Chief Complaint: Dizziness  HPI: [name] [name] is a 89 y.o. male with medical history significant of hypertension, hyperlipidemia, CVA, atrial fibrillation, CAD status post CABG, aortic stenosis status post TAVR, carotid artery disease, neuropathy, GERD presenting with dizziness.  Patient has ongoing issues with dizziness for 1 to 2 years.  Feels like his symptoms got worse after his aortic valve replacement last July.  Had an episode yesterday where he was grabbing his meal from [employer] and he became dizzy/weak and possibly lightheaded and due to the weakness fell to the ground.  Was able to support himself with his hands but did scrape his left knee.  States this episode felt similar to his previous episodes of vertigo.  He has had follow-up with vestibular rehab and has continued follow-up and this improved his symptoms in the past.  However he did report some concern as he has had a previous stroke and his episode feels somewhat different than his previous vertigo.  Also reporting some swelling of the left knee that he landed on.  Denies fevers, chills, chest pain, shortness of breath, abdominal pain, constipation, diarrhea, nausea, vomiting.  ED Course: Vital signs in the ED notable for blood pressure in the 150s-170 systolic, heart rate in the 50s-70s.  Lab workup included CMP with potassium 3.3, T. bili 1.6.  Magnesium  normal.  CBC with platelets 135.  PT 16.5, INR 1.3.  Troponin 37, repeat 36.  Urinalysis with hemoglobin, rare bacteria only.  Chest x-ray showed no acute normality but showed evidence of cardiomegaly, prior aortic valve replacement, CABG, pacemaker.  Left knee x-ray showed OA with calcification of the meniscus, suprapatellar bursa effusion.  Left hip x-ray showed no acute abnormality.  CT head showed no acute  abnormality.  CT left knee showed moderate to large joint effusion.  MRI brain showed acute punctate infarcts.  Cardiology consulted with patient's cardiac history for evaluation of this elevation in troponin and vertigo versus near syncope.  They will see the patient.  Neurology consulted for acute CVA in the setting of MRI positive for stroke.  They will see the patient.  Orthopedic surgery consulted by EDP to evaluate patient for arthrocentesis of his left knee.  Review of Systems: As per HPI otherwise all other systems reviewed and are negative.  Past Medical History:  Diagnosis Date   Arthritis    Atrial fibrillation, permanent (HCC)    Bladder neoplasm    Chronic cough    NO CARDIAC OR PULMONARY RELATED   Coronary artery disease CARDIOLOGIST-  DR KLEIN/ ALLRED   a. s/p CABG 2004, b. LHC with patent grafts 07/2005, ejection fraction 50%;  c. 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 Amiodarone  Rx   S/P CABG x 5 2004   S/P TAVR (transcatheter aortic valve replacement) 11/15/2022   s/p TAVR with a 29mm Edwards S3UR via the TF approach by Dr. Wonda & Maryjane.   Severe aortic stenosis  Past Surgical History:  Procedure Laterality Date   CARDIAC CATHETERIZATION  07-26-2005  DR GAMBLE   PRESERVED LVF/  EF 50%/ PATENT GRAFTS   CARDIAC CATHETERIZATION  03-04-2003  DR GAMBLE   SEVERE 3 VESSEL DISEASE   CARDIAC CATHETERIZATION  1991  DR [facility]   PAF/ FALSE POSITIVE STRESS TEST   CAROTID ENDARTERECTOMY Left 2022   CATARACT EXTRACTION W/ INTRAOCULAR LENS IMPLANT Right    CORONARY ARTERY BYPASS GRAFT  03-08-2003  DR [id number]    LIMA TO LAD/ SVG TO OM2/  SVG TO DIAGONAL / SVG TO PDA & PLA   CYSTOSCOPY WITH BIOPSY N/A 12/25/2012   Procedure: CYSTOSCOPY WITH BLADDER BIOPSY;  Surgeon: Donnice Gwenyth Brooks, MD;  Location: [facility];  Service: Urology;  Laterality: N/A;   ELECTROPHYSIOLOGY STUDY  07-27-2005  DR DANELLE [place]   MAPPING --   RESULT NONINDUCIBLE VT OR SVT/  DX RIGHT VENTRICULAR OUTFLOW TRACT VT AND RULES OUT MORE MALIGNANT CAUSES OF VT   ENDARTERECTOMY Left 07/08/2020   Procedure: LEFT CAROTID ENDARTERECTOMY;  Surgeon: Serene Gaile ORN, MD;  Location: [facility] OR;  Service: Vascular;  Laterality: Left;   INGUINAL HERNIA REPAIR Bilateral 1954  &  1990   INTRAOPERATIVE TRANSTHORACIC ECHOCARDIOGRAM N/A 11/15/2022   Procedure: INTRAOPERATIVE TRANSTHORACIC ECHOCARDIOGRAM;  Surgeon: Wonda Sharper, MD;  Location: [facility] INVASIVE CV LAB;  Service: Open Heart Surgery;  Laterality: N/A;   KNEE ARTHROSCOPY Right 1994   LEFT HEART CATH AND CORS/GRAFTS ANGIOGRAPHY N/A 10/14/2022   Procedure: LEFT HEART CATH AND CORS/GRAFTS ANGIOGRAPHY;  Surgeon: Wonda Sharper, MD;  Location: [facility] INVASIVE CV LAB;  Service: Cardiovascular;  Laterality: N/A;   LUMBAR DISC SURGERY  1999   L4 -- L5   MOHS SURGERY     by Dr. Lyle 2019   PACEMAKER IMPLANT N/A 04/03/2023   Procedure: PACEMAKER IMPLANT;  Surgeon: Fernande Elspeth BROCKS, MD;  Location: [facility] INVASIVE CV LAB;  Service: Cardiovascular;  Laterality: N/A;   REMOVAL VOCAL CORD POLYPS  1978   TONSILLECTOMY  AS CHILD   TRANSCATHETER AORTIC VALVE REPLACEMENT, TRANSFEMORAL N/A 11/15/2022   Procedure: Transcatheter Aortic Valve Replacement, Transfemoral;  Surgeon: Wonda Sharper, MD;  Location: [facility] INVASIVE CV LAB;  Service: Open Heart Surgery;  Laterality: N/A;   TRANSTHORACIC ECHOCARDIOGRAM  08/08/2011   MILD LVH/  EF 55-60%/  GRADE I DIASTOLIC DYSFUNCTION/ MILD AV STENOSIS    Social History  reports that he quit smoking about 43 years ago. His smoking use included cigarettes. He  started smoking about 68 years ago. He has a 25 pack-year smoking history. He has never used smokeless tobacco. He reports that he does not drink alcohol and does not use drugs.  Allergies  Allergen Reactions   Hydralazine  Hcl Other (See Comments)    Had severe headache all night.  Nightmares.  Drugged feeling.  Extreme dizziness   Ace Inhibitors Other (See Comments)    COUGH  Product containing angiotensin-converting enzyme inhibitor (product)   Zocor [Simvastatin] Other (See Comments)    MYALGIA   Nsaids Other (See Comments)    Would avoid given anticoagulation   Prednisone  Other (See Comments)    Prednisone  caused pt to have elevated BP.   Cephalexin Rash   Chocolate Other (See Comments)    Headaches    Doxycycline  Other (See Comments)    Nausea and abd pain   Gabapentin  Other (See Comments)    Dizziness   Hycodan [Hydrocodone  Bit-Homatrop Mbr] Nausea And Vomiting   Oxycodone  Nausea Only  Pt states that he cannot take prescription pain medication    Family History  Problem Relation Age of Onset   Cancer Mother    Stroke Neg Hx   Reviewed on admission  Prior to Admission medications   Medication Sig Start Date End Date Taking? Authorizing Provider  acetaminophen  (TYLENOL ) 500 MG tablet Take 500-1,000 mg by mouth every 6 (six) hours as needed (pain.).   Yes [provider]  apixaban  (ELIQUIS ) 5 MG TABS tablet Take 1 tablet (5 mg total) by mouth 2 (two) times daily. Take evening dose starting 07/09/20 07/09/20  Yes Baglia, Corrina, PA-C  butalbital -acetaminophen -caffeine  (FIORICET ) 50-325-40 MG tablet Take 1 tablet by mouth 3 (three) times daily as needed for headache. 01/03/24  Yes [provider]  cetirizine  (ZYRTEC ) 10 MG tablet Take 1 tablet (10 mg total) by mouth daily. 10/29/21  Yes Cleatus Arlyss RAMAN, MD  hydrochlorothiazide  (HYDRODIURIL ) 25 MG tablet Take 1 tablet (25 mg total) by mouth daily. 10/04/23 04/02/24 Yes Ollis, Brandi L, NP  Iron , Ferrous  Sulfate, 325 (65 Fe) MG TABS Take 325 mg by mouth daily. 03/26/23  Yes Cleatus Arlyss RAMAN, MD  latanoprost  (XALATAN ) 0.005 % ophthalmic solution Place 1 drop into both eyes at bedtime. 07/01/20  Yes [provider]  Polyethyl Glycol-Propyl Glycol (SYSTANE ULTRA OP) Place 1-2 drops into both eyes once as needed (dry eyes.).   Yes [provider]  potassium chloride  (KLOR-CON  M) 10 MEQ tablet Take 10 mEq by mouth in the morning.   Yes [provider]  pravastatin  (PRAVACHOL ) 80 MG tablet Take 1 tablet (80 mg total) by mouth every evening. 01/01/21  Yes Fernande Elspeth BROCKS, MD    Physical Exam: Vitals:   04/02/24 0841 04/02/24 1018 04/02/24 1030 04/02/24 1347  BP:  (!) 176/81 (!) 179/92 (!) 161/125  Pulse:  (!) 58 75 60  Resp:  13 17 18   Temp: 98 F (36.7 C)  98.1 F (36.7 C) 98.3 F (36.8 C)  TempSrc: Oral  Oral Oral  SpO2:  97% 97% 96%  Weight:      Height:        Physical Exam Constitutional:      General: He is not in acute distress.    Appearance: Normal appearance.  HENT:     Head: Normocephalic and atraumatic.     Mouth/Throat:     Mouth: Mucous membranes are moist.     Pharynx: Oropharynx is clear.  Eyes:     Extraocular Movements: Extraocular movements intact.     Pupils: Pupils are equal, round, and reactive to light.  Cardiovascular:     Rate and Rhythm: Normal rate and regular rhythm.     Pulses: Normal pulses.     Heart sounds: Normal heart sounds.  Pulmonary:     Effort: Pulmonary effort is normal. No respiratory distress.     Breath sounds: Normal breath sounds.  Abdominal:     General: Bowel sounds are normal. There is no distension.     Palpations: Abdomen is soft.     Tenderness: There is no abdominal tenderness.  Musculoskeletal:        General: No swelling or deformity.  Skin:    General: Skin is warm and dry.  Neurological:     Comments: Mental Status: Patient is awake, alert, oriented No signs of aphasia or neglect Cranial  Nerves: II: Pupils equal, round, and reactive to light.   III,IV, VI: EOMI without ptosis or diploplia.  V: Facial sensation is  symmetric to light touch. VII: Facial movement is symmetric.  VIII: hearing is intact to voice X: Uvula elevates symmetrically XI: Shoulder shrug is symmetric. XII: tongue is midline without atrophy or fasciculations.  Motor: Decent effort thorughout, at Least 5/5 bilateral UE, 5/5 bilateral lower extremitiy (LLE exam limited by knee pain)  Sensory: Sensation is grossly intact bilateral UEs & LEs    Labs on Admission: I have personally reviewed following labs and imaging studies  CBC: Recent Labs  Lab 04/02/24 0845  WBC 7.5  HGB 14.5  HCT 42.5  MCV 99.3  PLT 135*    Basic Metabolic Panel: Recent Labs  Lab 04/02/24 0845  NA 140  K 3.3*  CL 101  CO2 29  GLUCOSE 99  BUN 14  CREATININE 0.77  CALCIUM  9.3  MG 2.1    GFR: Estimated Creatinine Clearance: 67.2 mL/min (by C-G formula based on SCr of 0.77 mg/dL).  Liver Function Tests: Recent Labs  Lab 04/02/24 0845  AST 28  ALT 15  ALKPHOS 62  BILITOT 1.6*  PROT 7.1  ALBUMIN 3.9    Urine analysis:    Component Value Date/Time   COLORURINE YELLOW 04/02/2024 0845   APPEARANCEUR CLEAR 04/02/2024 0845   LABSPEC 1.011 04/02/2024 0845   PHURINE 7.0 04/02/2024 0845   GLUCOSEU NEGATIVE 04/02/2024 0845   HGBUR SMALL (A) 04/02/2024 0845   BILIRUBINUR NEGATIVE 04/02/2024 0845   KETONESUR NEGATIVE 04/02/2024 0845   PROTEINUR NEGATIVE 04/02/2024 0845   NITRITE NEGATIVE 04/02/2024 0845   LEUKOCYTESUR NEGATIVE 04/02/2024 0845    Radiological Exams on Admission: CT Knee Left Wo Contrast Result Date: 04/02/2024 CLINICAL DATA:  Fall.  Pain. EXAM: CT OF THE LEFT KNEE WITHOUT CONTRAST TECHNIQUE: Multidetector CT imaging of the left knee was performed according to the standard protocol. Multiplanar CT image reconstructions were also generated. RADIATION DOSE REDUCTION: This exam was performed  according to the departmental dose-optimization program which includes automated exposure control, adjustment of the mA and/or kV according to patient size and/or use of iterative reconstruction technique. COMPARISON:  Left knee radiograph dated 01/02/2024. FINDINGS: Bones/Joint/Cartilage No acute fracture or dislocation. Moderate-to-large joint effusion. No evidence of lipohemarthrosis. Chondrocalcinosis in the medial and lateral femorotibial compartments. Tricompartmental osteoarthritis with mild joint space narrowing and osteophytosis. Ligaments Ligaments are suboptimally evaluated by CT. Muscles and Tendons A Baker's cyst is noted. Quadriceps and patellar tendons appear intact. Soft tissue No loculated fluid collection. Peripheral vascular calcifications are noted. IMPRESSION: 1. No acute fracture or dislocation. 2. Moderate-to-large knee joint effusion. No evidence of lipohemarthrosis. 3. Tricompartmental osteoarthritis chondrocalcinosis in the medial and lateral compartments. Electronically Signed   By: Harrietta Sherry M.D.   On: 04/02/2024 13:39   MR BRAIN WO CONTRAST Result Date: 04/02/2024 CLINICAL DATA:  Syncope/presyncope EXAM: MRI HEAD WITHOUT CONTRAST TECHNIQUE: Multiplanar, multiecho pulse sequences of the brain and surrounding structures were obtained without intravenous contrast. COMPARISON:  August 02, 2023 FINDINGS: MRI brain: There is a punctate focus of restricted diffusion in the left frontal white matter and there are 2 punctate foci of restricted diffusion in the right frontal white matter. There are extensive and confluent foci of T2 hyperintensity in the cerebral white matter. These do not have restricted diffusion. The ventricles are normal. No mass lesion. There are normal flow signals in the carotid arteries and basilar artery. No significant bone marrow signal abnormality. No significant abnormality in the paranasal sinuses or soft tissues. IMPRESSION: 1. Punctate acute lacunar infarct  in the left frontal white matter and  2 punctate acute lacunar infarcts in the right frontal white matter 2. Extensive chronic white matter disease Electronically Signed   By: Nancyann Burns M.D.   On: 04/02/2024 13:15   DG Chest 2 View Result Date: 04/02/2024 EXAM: 2 VIEW(S) XRAY OF THE CHEST 04/02/2024 08:03:00 AM COMPARISON: 04/03/2023 CLINICAL HISTORY: dizziness FINDINGS: LUNGS AND PLEURA: No focal pulmonary opacity. No pleural effusion. No pneumothorax. HEART AND MEDIASTINUM: Mild cardiomegaly. Aortic valve prosthesis noted. CABG noted. Left chest single lead pacemaker noted. Aortic arch calcifications. BONES AND SOFT TISSUES: Sternotomy wires noted. Thoracic spondylosis. No acute osseous abnormality. IMPRESSION: 1. No acute findings. 2. Mild cardiomegaly with aortic valve prosthesis, CABG, and left chest single lead pacemaker. Electronically signed by: Evalene Coho MD 04/02/2024 08:30 AM EST RP Workstation: [id number]   DG Hip Unilat W or Wo Pelvis 2-3 Views Left Result Date: 04/02/2024 CLINICAL DATA:  Status post fall EXAM: DG HIP (WITH OR WITHOUT PELVIS) 3V LEFT COMPARISON:  None Available. FINDINGS: There is no evidence of hip fracture or dislocation. Mild degenerative changes of the hips. IMPRESSION: No acute fracture or dislocation. Mild degenerative changes of the hips. Electronically Signed   By: Limin  Xu M.D.   On: 04/02/2024 08:29   DG Knee Complete 4 Views Left Result Date: 04/02/2024 EXAM: 4 OR MORE VIEW(S) XRAY OF THE LEFT KNEE 04/02/2024 08:03:00 AM COMPARISON: None available. CLINICAL HISTORY: fall FINDINGS: BONES AND JOINTS: No acute fracture. No malalignment. There is tricompartmental osteoarthritis. There are calcifications present within the medial meniscus. There is a moderate suprapatellar bursal effusion. SOFT TISSUES: There are moderate atheromatous calcifications present. IMPRESSION: 1. 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 2. Moderate  suprapatellar bursal effusion. Electronically signed by: Evalene Coho MD 04/02/2024 08:29 AM EST RP Workstation: [id number]   CT HEAD WO CONTRAST ( ) Result Date: 04/02/2024 EXAM: CT HEAD WITHOUT CONTRAST 04/02/2024 07:29:16 AM TECHNIQUE: CT of the head was performed without the administration of intravenous contrast. Automated exposure control, iterative reconstruction, and/or weight based adjustment of the mA/kV was utilized to reduce the radiation dose to as low as reasonably achievable. COMPARISON: CT of the head dated 01/09/2023. CLINICAL HISTORY: Syncope/presyncope, cerebrovascular cause suspected. FINDINGS: BRAIN AND VENTRICLES: No acute hemorrhage. No evidence of acute infarct. No hydrocephalus. No extra-axial collection. No mass effect or midline shift. There is age-related atrophy and moderate diffuse cerebral white matter disease. There are senescent calcifications within the basal ganglia bilaterally. There are atheromatous calcifications within the carotid siphons and vertebral arteries. ORBITS: No acute abnormality. The patient is status post bilateral lens replacement. SINUSES: No acute abnormality. SOFT TISSUES AND SKULL: No acute soft tissue abnormality. No skull fracture. IMPRESSION: 1. No acute intracranial abnormality. 2. Age-related atrophy and moderate diffuse cerebral white matter disease. Electronically signed by: Evalene Coho MD 04/02/2024 07:32 AM EST RP Workstation: [id number]   EKG: Independently reviewed.  Ventricular paced rhythm at 56 bpm.  QTc 560.  Assessment/Plan Principal Problem:   Acute CVA (cerebrovascular accident) (HCC) Active Problems:   HTN (hypertension)   RVOT ventricular tachycardia (HCC)   Hyperlipidemia   GERD (gastroesophageal reflux disease)   Severe aortic stenosis   CAD (coronary artery disease), autologous vein bypass graft   Permanent atrial fibrillation (HCC)   S/P CABG x 5   S/P TAVR (transcatheter aortic valve replacement)    History of CVA in adulthood   Acute CVA > Had worsening episode of vertigo yesterday.  Has had ongoing issues with vertigo but this was a little bit different.  Does have history of prior CVA as well. > CT head showed no acute Sharol.  But MR brain was positive for multiple punctate infarcts. > Neurology consulted in the ED and will see the patient. Appreciate neurology recommendations and assistance - Allow for permissive HTN (systolic < 220 and diastolic < 120) \ - Continue Eliquis  - Continue home statin - Echocardiogram  - CTA head & neck  - A1C  - Lipid panel  - Tele monitoring  - SLP eval - PT/OT  Vertigo Fall > Patient with known history of vertigo which has had improvement with vestibular PT.  Has continued follow-up for this. > Had episode of vertigo like symptoms where he fell when grabbing his mail from [employer].  Felt this episode was a little bit abnormal from his previous.   > Likely largely secondary to his known vertigo, could be component related to his stroke above.  Cardiology also consulted in the ED to evaluate for cardiogenic etiology if this were to represent near syncope. - Appreciate cardiology recommendations and assistance - Will check orthostatic vital signs and he is getting an echo as a part of his stroke workup - Continue with outpatient follow-up  Hypertension - Permissive hypertension as above  Hyperlipidemia - Continue home pravastatin   A-fib - Continue home Eliquis   CAD > Status post CABG - Continue home statin, Eliquis   Carotid artery disease - Continue statin, Eliquis   DVT prophylaxis: Eliquis  Code Status:   Full Family Communication:  None on admission  Disposition Plan:   Patient is from:  Home  Anticipated DC to:  Home  Anticipated DC date:  1 to 2 days  Anticipated DC barriers: None  Consults called:  Cardiology, neurology, orthopedic surgery Admission status:  Observation, telemetry  Severity of Illness: The  appropriate patient status for this patient is OBSERVATION. Observation status is judged to be reasonable and necessary in order to provide the required intensity of service to ensure the patient's safety. The patient's presenting symptoms, physical exam findings, and initial radiographic and laboratory data in the context of their medical condition is felt to place them at decreased risk for further clinical deterioration. Furthermore, it is anticipated that the patient will be medically stable for discharge from the hospital within 2 midnights of admission.    Marsa KATHEE Scurry MD [facility]  How to contact the [facility] Attending or Consulting provider 7A - 7P or covering provider during after hours 7P -7A, for this patient?   Check the care team in [facility] and look for a) attending/consulting [facility] provider listed and b) the [facility] team listed Log into [email or web address] and use [place]'s universal password to access. If you do not have the password, please contact the hospital operator. Locate the [facility] provider you are looking for under [facility] and page to a number that you can be directly reached. If you still have difficulty reaching the provider, please page the [facility] (Director on Call) for the Hospitalists listed on amion for assistance.  04/02/2024, 2:56 PM

## 2024-04-02 NOTE — ED Notes (Signed)
PM interrogated . 

## 2024-04-02 NOTE — ED Notes (Signed)
 Pt requested to have his upper arm skin tear redressed

## 2024-04-02 NOTE — ED Notes (Signed)
 Pt lost I've inadvertently by rolling around. This RN has been in and out of the room since arriving to her shift. Sitter request ordered

## 2024-04-02 NOTE — Progress Notes (Signed)
  Device system confirmed to be MRI conditional, with implant date > 6 weeks ago, and no evidence of abandoned or epicardial leads in review of most recent CXR  Device last cleared by EP Provider: Daphne Barrack NP 04/02/24  Clearance is good through for 1 year as long as parameters remain stable at time of check. If pt undergoes a cardiac device procedure during that time, they should be re-cleared.   Tachy-therapies to be programmed off if applicable with device back to pre-MRI settings after completion of exam.  [employer] - Programming recommendation received through [employer] App/Tablet  Suzan Recardo Arna Debby  04/02/2024 12:34 PM

## 2024-04-02 NOTE — ED Triage Notes (Signed)
 PT arrives via EMS from home with a complaint of dizzyness that has worsened recently but has been present for a year. Also reports L knee pain.

## 2024-04-02 NOTE — ED Notes (Signed)
 Pt right upper arm skin tear redressed with tefla

## 2024-04-02 NOTE — ED Notes (Addendum)
 Pt was moved to rm 40 at 1900 with no report, naked, confused, alone and pulling at his IV and clothes. Yellow day RN is unaware of this new patient as well.

## 2024-04-02 NOTE — ED Notes (Signed)
 Monitored patient in MRI.

## 2024-04-03 ENCOUNTER — Observation Stay (HOSPITAL_COMMUNITY)

## 2024-04-03 ENCOUNTER — Ambulatory Visit: Payer: Self-pay | Admitting: Cardiology

## 2024-04-03 DIAGNOSIS — I4891 Unspecified atrial fibrillation: Secondary | ICD-10-CM | POA: Diagnosis not present

## 2024-04-03 DIAGNOSIS — I10 Essential (primary) hypertension: Secondary | ICD-10-CM | POA: Diagnosis not present

## 2024-04-03 DIAGNOSIS — I6340 Cerebral infarction due to embolism of unspecified cerebral artery: Secondary | ICD-10-CM | POA: Diagnosis not present

## 2024-04-03 DIAGNOSIS — Z7901 Long term (current) use of anticoagulants: Secondary | ICD-10-CM | POA: Diagnosis not present

## 2024-04-03 DIAGNOSIS — I6389 Other cerebral infarction: Secondary | ICD-10-CM | POA: Diagnosis not present

## 2024-04-03 DIAGNOSIS — I639 Cerebral infarction, unspecified: Secondary | ICD-10-CM | POA: Diagnosis not present

## 2024-04-03 LAB — COMPREHENSIVE METABOLIC PANEL WITH GFR
ALT: 16 U/L (ref 0–44)
AST: 40 U/L (ref 15–41)
Albumin: 3.7 g/dL (ref 3.5–5.0)
Alkaline Phosphatase: 53 U/L (ref 38–126)
Anion gap: 10 (ref 5–15)
BUN: 14 mg/dL (ref 8–23)
CO2: 26 mmol/L (ref 22–32)
Calcium: 8.7 mg/dL — ABNORMAL LOW (ref 8.9–10.3)
Chloride: 102 mmol/L (ref 98–111)
Creatinine, Ser: 1.03 mg/dL (ref 0.61–1.24)
GFR, Estimated: >60 mL/min (ref >60)
Glucose, Bld: 148 mg/dL — ABNORMAL HIGH (ref 70–99)
Potassium: 3.7 mmol/L (ref 3.5–5.1)
Sodium: 138 mmol/L (ref 135–145)
Total Bilirubin: 1.9 mg/dL — ABNORMAL HIGH (ref 0.0–1.2)
Total Protein: 7 g/dL (ref 6.5–8.1)

## 2024-04-03 LAB — LIPID PANEL
Cholesterol: 140 mg/dL (ref 0–200)
HDL: 70 mg/dL (ref >40)
LDL Cholesterol: 62 mg/dL (ref 0–99)
Total CHOL/HDL Ratio: 2 ratio
Triglycerides: 41 mg/dL (ref <150)
VLDL: 8 mg/dL (ref 0–40)

## 2024-04-03 LAB — CBC
HCT: 42.3 % (ref 39.0–52.0)
Hemoglobin: 14.1 g/dL (ref 13.0–17.0)
MCH: 32.7 pg (ref 26.0–34.0)
MCHC: 33.3 g/dL (ref 30.0–36.0)
MCV: 98.1 fL (ref 80.0–100.0)
Platelets: 147 K/uL — ABNORMAL LOW (ref 150–400)
RBC: 4.31 MIL/uL (ref 4.22–5.81)
RDW: 12.7 % (ref 11.5–15.5)
WBC: 10.6 K/uL — ABNORMAL HIGH (ref 4.0–10.5)
nRBC: 0 % (ref 0.0–0.2)

## 2024-04-03 LAB — ECHOCARDIOGRAM COMPLETE BUBBLE STUDY
AR max vel: 1.74 cm2
AV Area VTI: 1.87 cm2
AV Area mean vel: 1.89 cm2
AV Mean grad: 8 mmHg
AV Peak grad: 16.5 mmHg
Ao pk vel: 2.03 m/s
MV VTI: 2.45 cm2
S' Lateral: 3.9 cm

## 2024-04-03 LAB — HEMOGLOBIN A1C
Hgb A1c MFr Bld: 5.2 % (ref 4.8–5.6)
Mean Plasma Glucose: 102.54 mg/dL

## 2024-04-03 MED ORDER — OXYCODONE HCL 5 MG PO TABS
5.0000 mg | ORAL_TABLET | ORAL | Status: DC | PRN
  Administered 2024-04-03 – 2024-04-05 (×6): 5 mg via ORAL
  Filled 2024-04-03 (×6): qty 1

## 2024-04-03 MED ORDER — CYCLOBENZAPRINE HCL 5 MG PO TABS
7.5000 mg | ORAL_TABLET | Freq: Three times a day (TID) | ORAL | Status: DC | PRN
  Administered 2024-04-03: 7.5 mg via ORAL
  Filled 2024-04-03: qty 2

## 2024-04-03 MED ORDER — HYDROMORPHONE HCL 1 MG/ML IJ SOLN
0.5000 mg | Freq: Once | INTRAMUSCULAR | Status: AC
  Administered 2024-04-03: 0.5 mg via INTRAVENOUS
  Filled 2024-04-03: qty 0.5

## 2024-04-03 NOTE — Care Plan (Signed)
 Patient arrived to the unit AAOX 4 Complaining of pain 6/10 in the knee No other signs/symptoms of distress Skin assessment done with [place] / see avatar and photos  Sonny, RN

## 2024-04-03 NOTE — Care Management Obs Status (Signed)
 MEDICARE OBSERVATION STATUS NOTIFICATION   Patient Details  Name: RUARI MUDGETT MRN: [id number] Date of Birth: [birth year]   Medicare Observation Status Notification Given:  Yes    Nena LITTIE Coffee, RN 04/03/2024, 10:40 AM

## 2024-04-03 NOTE — Progress Notes (Addendum)
 STROKE TEAM PROGRESS NOTE   SUBJECTIVE (INTERVAL HISTORY)  Pt reports that he had an episode of vertigo with room spinning shortly after receiving his food from [employer] at his home. He started to fall and caught himself on the handrails and injuried his knee in the controlled fall and in the process of dragging himself into his house to then rest on his recliner, this was a few days ago. He has had vertigo events like this for the last 1-2 years, and reports they generally last between 2-15 minutes. Reports having an appointment with the [facility] in the next few months for further investigation. His primary complaint is his worsening knee pain since the fall. Reports strict compliance with Eliquis .  OBJECTIVE Temp:  [98 F (36.7 C)-98.7 F (37.1 C)] 98.3 F (36.8 C) (12/10 1115) Pulse Rate:  [55-75] 66 (12/10 1000) Cardiac Rhythm: Normal sinus rhythm (12/10 1021) Resp:  [18-23] 19 (12/10 1000) BP: (142-181)/(68-125) 142/68 (12/10 1000) SpO2:  [93 %-97 %] 96 % (12/10 1000)  Recent Labs  Lab 04/02/24 0837  GLUCAP 89   Recent Labs  Lab 04/02/24 0845 04/03/24 0114  NA 140 138  K 3.3* 3.7  CL 101 102  CO2 29 26  GLUCOSE 99 148*  BUN 14 14  CREATININE 0.77 1.03  CALCIUM  9.3 8.7*  MG 2.1  --    Recent Labs  Lab 04/02/24 0845 04/03/24 0114  AST 28 40  ALT 15 16  ALKPHOS 62 53  BILITOT 1.6* 1.9*  PROT 7.1 7.0  ALBUMIN 3.9 3.7   Recent Labs  Lab 04/02/24 0845 04/03/24 0114  WBC 7.5 10.6*  HGB 14.5 14.1  HCT 42.5 42.3  MCV 99.3 98.1  PLT 135* 147*   No results for input(s): CKTOTAL, CKMB, CKMBINDEX, TROPONINI in the last 168 hours. Recent Labs    04/02/24 0845  LABPROT 16.5*  INR 1.3*   Recent Labs    04/02/24 0845  COLORURINE YELLOW  LABSPEC 1.011  PHURINE 7.0  GLUCOSEU NEGATIVE  HGBUR SMALL*  BILIRUBINUR NEGATIVE  KETONESUR NEGATIVE  PROTEINUR NEGATIVE  NITRITE NEGATIVE  LEUKOCYTESUR NEGATIVE       Component Value  Date/Time   CHOL 140 04/03/2024 0114   CHOL 171 01/12/2023 1631   CHOL 158 12/20/2017 0000   TRIG 41 04/03/2024 0114   TRIG 95 12/20/2017 0000   HDL 70 04/03/2024 0114   HDL 68 01/12/2023 1631   CHOLHDL 2.0 04/03/2024 0114   VLDL 8 04/03/2024 0114   LDLCALC 62 04/03/2024 0114   LDLCALC 89 01/12/2023 1631   LDLCALC 74 12/20/2017 0000   Lab Results  Component Value Date   HGBA1C 5.2 04/03/2024      Component Value Date/Time   LABOPIA NONE DETECTED 03/10/2020 1733   COCAINSCRNUR NONE DETECTED 03/10/2020 1733   LABBENZ NONE DETECTED 03/10/2020 1733   AMPHETMU NONE DETECTED 03/10/2020 1733   THCU NONE DETECTED 03/10/2020 1733   LABBARB POSITIVE (A) 03/10/2020 1733    No results for input(s): ETH in the last 168 hours.  I have personally reviewed the radiological images below and agree with the radiology interpretations.  CT ANGIO HEAD NECK W WO CM Result Date: 04/02/2024 EXAM: CTA HEAD AND NECK WITH AND WITHOUT 04/02/2024 09:07:20 PM TECHNIQUE: CTA of the head and neck was performed with and without the administration of intravenous contrast. Multiplanar 2D and/or 3D reformatted images are provided for review. Automated exposure control, iterative reconstruction, and/or weight based adjustment of the  mA/kV was utilized to reduce the radiation dose to as low as reasonably achievable. Stenosis of the internal carotid arteries measured using NASCET criteria. COMPARISON: CTA head/neck from 01/09/2023 CLINICAL HISTORY: Stroke/TIA, determine embolic source FINDINGS: AORTIC ARCH AND ARCH VESSELS: No dissection or arterial injury. No significant stenosis of the brachiocephalic or subclavian arteries. CERVICAL CAROTID ARTERIES: No dissection, arterial injury, or hemodynamically significant stenosis by NASCET criteria. CERVICAL VERTEBRAL ARTERIES: No dissection, arterial injury, or significant stenosis. LUNGS AND MEDIASTINUM: Unremarkable. SOFT TISSUES: No acute abnormality. BONES: No acute  abnormality. ANTERIOR CIRCULATION: No significant stenosis of the internal carotid arteries. No significant stenosis of the anterior cerebral arteries. No significant stenosis of the middle cerebral arteries. No aneurysm. POSTERIOR CIRCULATION: No significant stenosis of the posterior cerebral arteries. No significant stenosis of the basilar artery. No significant stenosis of the vertebral arteries. No aneurysm. OTHER: No dural venous sinus thrombosis on this non-dedicated study. IMPRESSION: 1. No large vessel occlusion or hemodynamically significant stenosis. Electronically signed by: Gilmore Molt MD 04/02/2024 10:27 PM EST RP Workstation: [id number]   CT Knee Left Wo Contrast Result Date: 04/02/2024 CLINICAL DATA:  Fall.  Pain. EXAM: CT OF THE LEFT KNEE WITHOUT CONTRAST TECHNIQUE: Multidetector CT imaging of the left knee was performed according to the standard protocol. Multiplanar CT image reconstructions were also generated. RADIATION DOSE REDUCTION: This exam was performed according to the departmental dose-optimization program which includes automated exposure control, adjustment of the mA and/or kV according to patient size and/or use of iterative reconstruction technique. COMPARISON:  Left knee radiograph dated 01/02/2024. FINDINGS: Bones/Joint/Cartilage No acute fracture or dislocation. Moderate-to-large joint effusion. No evidence of lipohemarthrosis. Chondrocalcinosis in the medial and lateral femorotibial compartments. Tricompartmental osteoarthritis with mild joint space narrowing and osteophytosis. Ligaments Ligaments are suboptimally evaluated by CT. Muscles and Tendons A Baker's cyst is noted. Quadriceps and patellar tendons appear intact. Soft tissue No loculated fluid collection. Peripheral vascular calcifications are noted. IMPRESSION: 1. No acute fracture or dislocation. 2. Moderate-to-large knee joint effusion. No evidence of lipohemarthrosis. 3. Tricompartmental osteoarthritis  chondrocalcinosis in the medial and lateral compartments. Electronically Signed   By: Harrietta Sherry M.D.   On: 04/02/2024 13:39   MR BRAIN WO CONTRAST Result Date: 04/02/2024 CLINICAL DATA:  Syncope/presyncope EXAM: MRI HEAD WITHOUT CONTRAST TECHNIQUE: Multiplanar, multiecho pulse sequences of the brain and surrounding structures were obtained without intravenous contrast. COMPARISON:  August 02, 2023 FINDINGS: MRI brain: There is a punctate focus of restricted diffusion in the left frontal white matter and there are 2 punctate foci of restricted diffusion in the right frontal white matter. There are extensive and confluent foci of T2 hyperintensity in the cerebral white matter. These do not have restricted diffusion. The ventricles are normal. No mass lesion. There are normal flow signals in the carotid arteries and basilar artery. No significant bone marrow signal abnormality. No significant abnormality in the paranasal sinuses or soft tissues. IMPRESSION: 1. Punctate acute lacunar infarct in the left frontal white matter and 2 punctate acute lacunar infarcts in the right frontal white matter 2. Extensive chronic white matter disease Electronically Signed   By: Nancyann Burns M.D.   On: 04/02/2024 13:15   DG Chest 2 View Result Date: 04/02/2024 EXAM: 2 VIEW(S) XRAY OF THE CHEST 04/02/2024 08:03:00 AM COMPARISON: 04/03/2023 CLINICAL HISTORY: dizziness FINDINGS: LUNGS AND PLEURA: No focal pulmonary opacity. No pleural effusion. No pneumothorax. HEART AND MEDIASTINUM: Mild cardiomegaly. Aortic valve prosthesis noted. CABG noted. Left chest single lead pacemaker noted. Aortic arch calcifications. BONES AND  SOFT TISSUES: Sternotomy wires noted. Thoracic spondylosis. No acute osseous abnormality. IMPRESSION: 1. No acute findings. 2. Mild cardiomegaly with aortic valve prosthesis, CABG, and left chest single lead pacemaker. Electronically signed by: Evalene Coho MD 04/02/2024 08:30 AM EST RP Workstation:  [id number]   DG Hip Unilat W or Wo Pelvis 2-3 Views Left Result Date: 04/02/2024 CLINICAL DATA:  Status post fall EXAM: DG HIP (WITH OR WITHOUT PELVIS) 3V LEFT COMPARISON:  None Available. FINDINGS: There is no evidence of hip fracture or dislocation. Mild degenerative changes of the hips. IMPRESSION: No acute fracture or dislocation. Mild degenerative changes of the hips. Electronically Signed   By: Limin  Dayle Mcnerney M.D.   On: 04/02/2024 08:29   DG Knee Complete 4 Views Left Result Date: 04/02/2024 EXAM: 4 OR MORE VIEW(S) XRAY OF THE LEFT KNEE 04/02/2024 08:03:00 AM COMPARISON: None available. CLINICAL HISTORY: fall FINDINGS: BONES AND JOINTS: No acute fracture. No malalignment. There is tricompartmental osteoarthritis. There are calcifications present within the medial meniscus. There is a moderate suprapatellar bursal effusion. SOFT TISSUES: There are moderate atheromatous calcifications present. IMPRESSION: 1. 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 2. Moderate suprapatellar bursal effusion. Electronically signed by: Evalene Coho MD 04/02/2024 08:29 AM EST RP Workstation: [id number]   CT HEAD WO CONTRAST ( ) Result Date: 04/02/2024 EXAM: CT HEAD WITHOUT CONTRAST 04/02/2024 07:29:16 AM TECHNIQUE: CT of the head was performed without the administration of intravenous contrast. Automated exposure control, iterative reconstruction, and/or weight based adjustment of the mA/kV was utilized to reduce the radiation dose to as low as reasonably achievable. COMPARISON: CT of the head dated 01/09/2023. CLINICAL HISTORY: Syncope/presyncope, cerebrovascular cause suspected. FINDINGS: BRAIN AND VENTRICLES: No acute hemorrhage. No evidence of acute infarct. No hydrocephalus. No extra-axial collection. No mass effect or midline shift. There is age-related atrophy and moderate diffuse cerebral white matter disease. There are senescent calcifications within the basal ganglia  bilaterally. There are atheromatous calcifications within the carotid siphons and vertebral arteries. ORBITS: No acute abnormality. The patient is status post bilateral lens replacement. SINUSES: No acute abnormality. SOFT TISSUES AND SKULL: No acute soft tissue abnormality. No skull fracture. IMPRESSION: 1. No acute intracranial abnormality. 2. Age-related atrophy and moderate diffuse cerebral white matter disease. Electronically signed by: Evalene Coho MD 04/02/2024 07:32 AM EST RP Workstation: [id number]   CUP PACEART REMOTE DEVICE CHECK Result Date: 04/01/2024 PPM Scheduled remote reviewed. Normal device function.  Presenting rhythm: VP, PVCs. Next remote transmission per protocol. - CS, CVRS    PHYSICAL EXAM  Temp:  [98 F (36.7 C)-98.7 F (37.1 C)] 98.3 F (36.8 C) (12/10 1115) Pulse Rate:  [55-75] 66 (12/10 1000) Resp:  [18-23] 19 (12/10 1000) BP: (142-181)/(68-125) 142/68 (12/10 1000) SpO2:  [93 %-97 %] 96 % (12/10 1000)  General - Well nourished, well developed, in no apparent distress.  Mental Status -  Level of arousal and orientation to time, place, and person were intact. Language including expression, naming, repetition, comprehension was assessed and found intact. Attention span and concentration were normal. Recent and remote memory were intact. Fund of Knowledge was assessed and was intact.  Cranial Nerves II - XII - II - Visual field intact OU. III, IV, VI - Extraocular movements intact. V - Facial sensation intact bilaterally. VII - Facial movement intact bilaterally. VIII - Hearing & vestibular intact bilaterally. X - Palate elevates symmetrically. XI - Chin turning & shoulder shrug intact bilaterally. XII - Tongue protrusion intact.  L knee warm to touch, enlarged compared to R  knee, and tender to palpation.  Motor Strength - Exam limited by L knee pain. Pt unable to flex L hip or knee because of pain. Can move L toes. Motor strength everywhere else  5/5. Pronator drift was absent.  Bulk was normal and fasciculations were absent.    Sensory - Light touch was assessed and was symmetrical.    Coordination - The patient had normal movements in the hands with no ataxia or dysmetria.  Tremor was absent.  Gait and Station - deferred.   ASSESSMENT/PLAN Mr. ISIAAH CUERVO is a 89 y.o. male with history of HTN, HLD, CVA, Afib, CAD s/p CABG, and Aortic stenosis s/p TAVR admitted for recent fall. Imaging showing punctate lesions likely an incidental finding, not a failure of current regimen. Continue Eliquis  for Afib, may change to IV Heparin  if needed for procedure.   Stroke: 3 punctate infarcts bilateral frontal lobes, embolic pattern, likely A-fib given on Eliquis  MRI: Punctate acute lacunar infarct of the L frontal white matter and 2 punctate acute lacunar infarcts in the R frontal white matter, with extensive chronic white matter disease. CTA: No large vessel occlusion or hemodynamically significant stenosis 2D Echo: EF 45 to 50%, aortic dilatation, no PFO LDL: 62 HgbA1c: 5.2 Eliquis  for VTE prophylaxis Eliquis  (apixaban ) daily prior to admission, now on Eliquis  (apixaban ) daily.  May consider heparin  IV if orthopedic surgery concerning AC for procedure Patient counseled to be compliant with his antithrombotic medications Ongoing aggressive stroke risk factor management Therapy recommendations:  CIR Disposition:  Pending   History of stroke Admitted in 02/2020 for right eye upper visual field vision loss.  Ophthalmologist saw [name] plaque in the branch of the retinal artery.  CTA head and neck no high-grade stenosis, however left ICA bulb calcified plaque with soft plaque versus plaque ulceration, indicating high risk plaque.  MRI no acute infarct.  EF 55 to 60%.  LDL 85 and A1c 5.6.  Discharge on Eliquis  and aspirin  81. 06/2020 status post left carotid endarterectomy Followed [facility] with Dr. Ines  AF On Eliquis  PTA Patient  insist compliance with Eliquis  at home Continue Eliquis  for now May consider heparin  IV if orthopedic surgery concerning [facility] for procedures  Left knee pain and swelling Related to recent fall and trauma and knee Recommend orthopedic surgery consultation May need aspiration, discussed with Dr. Carlota  Hypertension Home meds: hydrochlorothiazide ,  Permissive hypertension (OK if <220/120) for 24-48 hours post stroke and then gradually normalized within 5-7 days. Long term BP goal 140/90  Hyperlipidemia Home meds:  Pravastatin  80mg  daily LDL 62, goal < 70 Now on Pravastatin  80mg  daily Continue statin at discharge  Other Stroke Risk Factors Advanced age Coronary artery disease status post CABG  Other medical issues Status post TAVR History of bladder cancer  Hospital day # 0  D. Penne Mori, DO, PGY1 Neurology Stroke Team 04/03/2024 12:49 PM  ATTENDING NOTE: I reviewed above note and agree with the assessment and plan. Pt was seen and examined.   No family at bedside.  Patient lying in bed, complaining of left knee pain after falling in the garage stairs.  Patient stated that he had acute episode of dizziness/vertigo yesterday in the garage stairs, fell hitting left knee and took him long time and back in room.  Currently left knee swollen and warm and tenderness which affected his movement of left lower leg.  On exam, patient neurologic intact except left lower extremity limited motion due to pain.  MRI showed 3 punctate  bilateral frontal infarcts, likely incidental finding but would like to continue Eliquis .  Recommend orthopedic surgery consultation for left knee.  LDL at goal, continue home pravastatin  80.  PT and OT recommend CIR.  For detailed assessment and plan, please refer to above as I have made changes wherever appropriate.   Neurology will sign off. Please call with questions. Pt will follow up with stroke clinic NP at [facility] in about 4 weeks. Thanks for the  consult.   Ary Cummins, MD PhD Stroke Neurology 04/03/2024 6:36 PM    To contact Stroke Continuity provider, please refer to [email or web address]. After hours, contact General Neurology

## 2024-04-03 NOTE — Progress Notes (Signed)
 [facility] night cross cover note:   I was notified by the patient's RN that this patient is complaining of knee discomfort following arthrocentesis by orthopedic surgery, with the knee pain refractory to existing orders for as needed oxycodone  5 mg as well as Flexeril  7.5 mg, with patient requesting something additional to help further address his knee discomfort.  I subsequently ordered Dilaudid  0.5 mg IV x 1 dose now, noting that he has tolerated IV Dilaudid  earlier during this current hospitalization.     Eva Pore, DO Hospitalist

## 2024-04-03 NOTE — Progress Notes (Addendum)
 Inpatient Rehab Admissions Coordinator:   Consult received and chart reviewed.  Admitted following a fall with knee pain, workup revealed acute CVAs.  Now s/p aspiration with ortho and moved from ED to IP bed.  Will f/u tomorrow to see how he does with therapy.   Reche Lowers, PT, DPT Admissions Coordinator [phone] 04/03/24 4:16 PM

## 2024-04-03 NOTE — Evaluation (Signed)
 Occupational Therapy Evaluation Patient Details Name: [name] [name] MRN: [id number] DOB: 1935-01-29 Today's Date: 04/03/2024   History of Present Illness   Pt is 89 year old presented to [facility] on  04/02/24 for dizziness and fall. MRI with acute punctate infarcts in bilateral frontal lobes. Pt with large to moderate rt knee effusion.  PMH - afib, htn, cabg, TAVR, CVA, bladder CVA     Clinical Impressions Session limited by pain and ortho MD arrival for procedure. PTA pt lives alone independently. States his wife recently passed away 2 months ago and his son, who lives locally, assists with IADL task and errands PRN. Pt also has private duty assistance for IADL tasks 1-2x/wk. Pt's primary complaints are dizziness, visual changes (blurred and double vision) and L knee pain. Due to these deficits pt currently requires mod A with sit - stand and mod to max A with LB ADL tasks. At this time recommend intensive inpatient follow-up therapy, >3 hours/day to facilitate safe DC home. Acute OT to follow.      If plan is discharge home, recommend the following:   Two people to help with walking and/or transfers;A lot of help with bathing/dressing/bathroom;Assist for transportation;Help with stairs or ramp for entrance     Functional Status Assessment   Patient has had a recent decline in their functional status and demonstrates the ability to make significant improvements in function in a reasonable and predictable amount of time.     Equipment Recommendations   BSC/3in1     Recommendations for Other Services   Rehab consult     Precautions/Restrictions   Precautions Precautions: Fall Restrictions Weight Bearing Restrictions Per Provider Order: No     Mobility Bed Mobility Overal bed mobility: Needs Assistance Bed Mobility: Supine to Sit, Sit to Supine     Supine to sit: Mod assist, Used rails, HOB elevated Sit to supine: Mod assist   General bed mobility comments:  Assist to elevate trunk into sitting and to bring LE's back up onto stretcher    Transfers Overall transfer level: Needs assistance Equipment used: Rolling walker (2 wheels) Transfers: Sit to/from Stand Sit to Stand: Mod assist           General transfer comment: Assist to power up and stabilize      Balance Overall balance assessment: Needs assistance, History of Falls Sitting-balance support: No upper extremity supported, Feet supported Sitting balance-Leahy Scale: Fair     Standing balance support: Bilateral upper extremity supported Standing balance-Leahy Scale: Poor Standing balance comment: Walker and min assist                           ADL either performed or assessed with clinical judgement   ADL Overall ADL's : Needs assistance/impaired Eating/Feeding: Modified independent   Grooming: Set up;Sitting   Upper Body Bathing: Set up;Sitting   Lower Body Bathing: Moderate assistance;Bed level   Upper Body Dressing : Set up;Sitting   Lower Body Dressing: Maximal assistance;Bed level               Functional mobility during ADLs: Moderate assistance       Vision Baseline Vision/History: 1 Wears glasses Ability to See in Adequate Light: 1 Impaired Patient Visual Report: Diplopia;Blurring of vision Vision Assessment?: Wears glasses for reading;Wears glasses for driving;Yes (glasses for distance; not in room) Eye Alignment: Within Functional Limits Alignment/Gaze Preference: Within Defined Limits Tracking/Visual Pursuits: Decreased smoothness of horizontal tracking;Decreased smoothness of vertical tracking  Saccades: Additional head turns occurred during testing Diplopia Assessment: Disappears with one eye closed;Objects split side to side;Present in far gaze Additional Comments: will further assess     Perception         Praxis         Pertinent Vitals/Pain Pain Assessment Pain Assessment: 0-10 Pain Score: 10-Worst pain ever Pain  Location: lt knee Pain Descriptors / Indicators: Guarding, Grimacing, Moaning Pain Intervention(s): Limited activity within patient's tolerance     Extremity/Trunk Assessment Upper Extremity Assessment Upper Extremity Assessment: Overall WFL for tasks assessed   Lower Extremity Assessment Lower Extremity Assessment: Defer to PT evaluation LLE Deficits / Details: Limited by severe knee pain. Unable to lift against gravity or bear weight LLE: Unable to fully assess due to pain   Cervical / Trunk Assessment Cervical / Trunk Assessment: Normal   Communication Communication Communication: Impaired Factors Affecting Communication: Hearing impaired   Cognition Arousal: Alert Behavior During Therapy: WFL for tasks assessed/performed Cognition: No family/caregiver present to determine baseline (will further assess)                               Following commands: Impaired Following commands impaired: Follows multi-step commands with increased time     Cueing  General Comments   Cueing Techniques: Verbal cues;Tactile cues      Exercises     Shoulder Instructions      Home Living Family/patient expects to be discharged to:: Private residence Living Arrangements: Alone Available Help at Discharge: Family;Available PRN/intermittently Type of Home: House Home Access: Stairs to enter Entrance Stairs-Number of Steps: 3 Entrance Stairs-Rails: Right Home Layout: Multi-level;1/2 bath on main level (split level) Alternate Level Stairs-Number of Steps: 6-8 Alternate Level Stairs-Rails: Right Bathroom Shower/Tub: Producer, Television/film/video: Handicapped height Bathroom Accessibility: Yes How Accessible: Accessible via walker Home Equipment: Rolling Walker (2 wheels);Rollator (4 wheels);Shower seat - built in   Additional Comments: Has been sleeping on couch on main level for past several days      Prior Functioning/Environment Prior Level of Function :  Independent/Modified Independent;Needs assist             Mobility Comments: Has rollator on each level of home ADLs Comments: DIL orders groceries; son runs errnads, takes hiim to appointments; wife recently passed away 2 months    OT Problem List: Decreased strength;Decreased range of motion;Decreased activity tolerance;Impaired balance (sitting and/or standing);Decreased safety awareness;Decreased knowledge of use of DME or AE;Pain;Increased edema   OT Treatment/Interventions: Self-care/ADL training;Therapeutic exercise;DME and/or AE instruction;Therapeutic activities;Visual/perceptual remediation/compensation;Balance training;Patient/family education      OT Goals(Current goals can be found in the care plan section)   Acute Rehab OT Goals Patient Stated Goal: less pain; go home OT Goal Formulation: With patient Time For Goal Achievement: 04/17/24 Potential to Achieve Goals: Good   OT Frequency:  Min 2X/week    Co-evaluation              AM-PAC OT 6 Clicks Daily Activity     Outcome Measure Help from another person eating meals?: None Help from another person taking care of personal grooming?: A Little Help from another person toileting, which includes using toliet, bedpan, or urinal?: A Lot Help from another person bathing (including washing, rinsing, drying)?: A Lot Help from another person to put on and taking off regular upper body clothing?: A Little Help from another person to put on and taking off regular lower body  clothing?: A Lot 6 Click Score: 16   End of Session Nurse Communication: Mobility status  Activity Tolerance: Patient limited by pain Patient left: in bed;with call bell/phone within reach;with nursing/sitter in room;Other (comment) (MD in room to complete a procedure)  OT Visit Diagnosis: Unsteadiness on feet (R26.81);Other abnormalities of gait and mobility (R26.89);Muscle weakness (generalized) (M62.81);History of falling (Z91.81);Low  vision, both eyes (H54.2);Pain Pain - Right/Left: Left Pain - part of body: Knee                Time: 1251-1309 OT Time Calculation (min): 18 min Charges:  OT General Charges $OT Visit: 1 Visit OT Evaluation $OT Eval Low Complexity: 1 Low  Piotr Christopher, OT/L   Acute OT Clinical Specialist Acute Rehabilitation Services Pager [phone] Office [phone]   [facility] 04/03/2024, 2:11 PM

## 2024-04-03 NOTE — Progress Notes (Signed)

## 2024-04-03 NOTE — Progress Notes (Signed)
°   04/03/24 1043  TOC Brief Assessment  Insurance and Status Reviewed  Patient has primary care physician Yes  Home environment has been reviewed From home alone  Prior level of function: Minimal assist  Prior/Current Home Services Current home services ([facility] x1/wk)  Social Drivers of Health Review SDOH reviewed no interventions necessary  Readmission risk has been reviewed Yes  Transition of care needs no transition of care needs at this time   Current DME: walker, wc Active c/[facility] Son, friends, neighbors provide transportation.   Pt prefers to go home at dc but currently can't lift his left leg. Uses walker regularly at baseline. PT recommending  intensive inpatient follow-up therapy >3 hours/day.

## 2024-04-03 NOTE — Evaluation (Signed)
 Physical Therapy Evaluation Patient Details Name: [name] [name] MRN: [id number] DOB: 03-07-1935 Today's Date: 04/03/2024  History of Present Illness  Pt is 89 year old presented [facility] on  04/02/[facility] for dizziness and fall. MRI with acute punctate infarcts in bilateral frontal lobes. Pt with large to moderate rt knee effusion.  PMH - afib, htn, cabg, TAVR, CVA, bladder CVA  Clinical Impression  Pt admitted with above diagnosis and presents to PT with functional limitations due to deficits listed below (See PT problem list). Pt needs skilled PT to maximize independence and safety. Pt lives alone in split level home. Currently lt knee pain is biggest limitation. If pain improves expect his mobility will improve. Patient will benefit from intensive inpatient follow-up therapy, >3 hours/day.           If plan is discharge home, recommend the following: A lot of help with walking and/or transfers;A little help with bathing/dressing/bathroom;Help with stairs or ramp for entrance;Assist for transportation;Assistance with cooking/housework   Can travel by private vehicle        Equipment Recommendations None recommended by PT  Recommendations for Other Services       Functional Status Assessment Patient has had a recent decline in their functional status and demonstrates the ability to make significant improvements in function in a reasonable and predictable amount of time.     Precautions / Restrictions Precautions Precautions: Fall Restrictions Weight Bearing Restrictions Per Provider Order: No      Mobility  Bed Mobility Overal bed mobility: Needs Assistance Bed Mobility: Supine to Sit, Sit to Supine     Supine to sit: Mod assist, Used rails, HOB elevated Sit to supine: Mod assist   General bed mobility comments: Assist to elevate trunk into sitting and to bring LE's back up onto stretcher    Transfers Overall transfer level: Needs assistance Equipment used: Rolling  walker (2 wheels) Transfers: Sit to/from Stand Sit to Stand: Mod assist           General transfer comment: Assist to power up and stabilize    Ambulation/Gait               General Gait Details: Unable to take any steps due to lt knee pain  Stairs            Wheelchair Mobility     Tilt Bed    Modified Rankin (Stroke Patients Only)       Balance Overall balance assessment: Needs assistance Sitting-balance support: No upper extremity supported, Feet supported Sitting balance-Leahy Scale: Fair     Standing balance support: Bilateral upper extremity supported Standing balance-Leahy Scale: Poor Standing balance comment: Walker and min assist                             Pertinent Vitals/Pain Pain Assessment Pain Assessment: 0-10 Pain Score: 10-Worst pain ever Pain Location: lt knee Pain Descriptors / Indicators: Guarding, Grimacing Pain Intervention(s): Limited activity within patient's tolerance, Monitored during session, Repositioned    Home Living Family/patient expects to be discharged to:: Private residence Living Arrangements: Alone Available Help at Discharge: Family;Available PRN/intermittently Type of Home: House Home Access: Stairs to enter Entrance Stairs-Rails: Right Entrance Stairs-Number of Steps: 3 Alternate Level Stairs-Number of Steps: 6-8 Home Layout: Multi-level;1/2 bath on main level (split level) Home Equipment: Rolling Walker (2 wheels);Rollator (4 wheels);Shower seat - built in Additional Comments: Has been sleeping on couch on main level for past several  days    Prior Function Prior Level of Function : Independent/Modified Independent             Mobility Comments: Has rollator on each level of home       Extremity/Trunk Assessment   Upper Extremity Assessment Upper Extremity Assessment: Defer to OT evaluation    Lower Extremity Assessment Lower Extremity Assessment: LLE deficits/detail LLE  Deficits / Details: Limited by severe knee pain. Unable to lift against gravity or bear weight LLE: Unable to fully assess due to pain       Communication   Communication Communication: Impaired Factors Affecting Communication: Hearing impaired    Cognition Arousal: Alert Behavior During Therapy: WFL for tasks assessed/performed   PT - Cognitive impairments: Awareness, Memory                         Following commands: Impaired Following commands impaired: Follows multi-step commands with increased time     Cueing Cueing Techniques: Verbal cues, Tactile cues     General Comments General comments (skin integrity, edema, etc.): VSS on RA    Exercises     Assessment/Plan    PT Assessment Patient needs continued PT services  PT Problem List         PT Treatment Interventions DME instruction;Gait training;Stair training;Functional mobility training;Therapeutic activities;Therapeutic exercise;Balance training;Patient/family education    PT Goals (Current goals can be found in the Care Plan section)  Acute Rehab PT Goals Patient Stated Goal: go home PT Goal Formulation: With patient Time For Goal Achievement: 04/17/24 Potential to Achieve Goals: Good    Frequency Min 4X/week     Co-evaluation               AM-PAC PT 6 Clicks Mobility  Outcome Measure Help needed turning from your back to your side while in a flat bed without using bedrails?: A Little Help needed moving from lying on your back to sitting on the side of a flat bed without using bedrails?: A Lot Help needed moving to and from a bed to a chair (including a wheelchair)?: A Lot Help needed standing up from a chair using your arms (e.g., wheelchair or bedside chair)?: A Lot Help needed to walk in hospital room?: Total Help needed climbing 3-5 steps with a railing? : Total 6 Click Score: 11    End of Session Equipment Utilized During Treatment: Gait belt Activity Tolerance: Patient  limited by pain Patient left: in bed;with call bell/phone within reach Nurse Communication: Mobility status PT Visit Diagnosis: Other abnormalities of gait and mobility (R26.89);Muscle weakness (generalized) (M62.81);History of falling (Z91.81);Difficulty in walking, not elsewhere classified (R26.2);Pain Pain - Right/Left: Left Pain - part of body: Knee    Time: 9061-9040 PT Time Calculation (min) (ACUTE ONLY): 21 min   Charges:   PT Evaluation $PT Eval Moderate Complexity: 1 Mod   PT General Charges $$ ACUTE PT VISIT: 1 Visit         [facility] PT Acute Rehabilitation Services Office [phone]   [facility] ORN [facility] 04/03/2024, 10:16 AM

## 2024-04-03 NOTE — Progress Notes (Signed)
 Consultation Progress Note   Patient: [name] [name] DOB: January 02, 1935 DOA: 04/02/2024 DOS: the patient was seen and examined on 04/03/2024 Primary service: DibiaLandon BRAVO, MD  Brief hospital course: 89 y.o. male with medical history significant of hypertension, hyperlipidemia, CVA, atrial fibrillation, CAD status post CABG, aortic stenosis status post TAVR, carotid artery disease, neuropathy, GERD presenting with dizziness.  Dizziness is chronic.  Recently patient had a fall where he became dizzy weak, fell supporting himself with his hands and scraped his left knee.  Does complain of left knee swelling and severe pain.  Patient's MRI showed acute punctate infarcts CT of his left knee showed moderate to large joint effusion.  Neurology and orthopedics have been consulted  Assessment and Plan: Acute CVA  Patient with chronic vertigo had worsening episode prior to admission. MRI showed multiple punctate infarcts. CTA head and neck.; No large vessel occlusion or hemodynamically significant stenosis.  Echo pending Continue Eliquis  and statins. LDL-62 hemoglobin A1c- 5.2 PT/OT/speech evaluation Neurology following  Left Knee swelling - Orthopedic surgery consulted for evaluation for arthrocentesis.  Vertigo Fall Patient follows with vestibular PT. Cardiology consulted to rule out cardiogenic etiology of his near syncope. - Appreciate cardiology recommendations and assistance - Will check orthostatic vital signs and he is getting an echo as a part of his stroke workup - Continue with outpatient follow-up  Hypertension - Permissive hypertension as above   Hyperlipidemia - Continue home pravastatin    A-fib - Continue home Eliquis    CAD > Status post CABG - Continue home statin, Eliquis    Carotid artery disease - Continue statin, Eliquis      TRH will continue to follow the patient.  Subjective: Seen at bedside this morning complains of 8/10 pain in the  left knee He is unable to raise his left knee as opposed to his right knee.  Physical Exam: Vitals:   04/03/24 0600 04/03/24 0716 04/03/24 1000 04/03/24 1115  BP: (!) 153/75  (!) 142/68   Pulse: 71  66   Resp: 19  19   Temp:  98.3 F (36.8 C)  98.3 F (36.8 C)  TempSrc:  Oral  Oral  SpO2: 93%  96%   Weight:      Height:       General  No acute Distress Eyes: PERRL, lids and conjunctivae normal ENMT: Mucous membranes are moist.   Neck: normal, supple, no masses, no thyromegaly Respiratory: clear to auscultation bilaterally, no wheezing, no crackles. Normal respiratory effort. No accessory muscle use.  Cardiovascular: Regular rate and rhythm, no murmurs / rubs / gallops Abdomen: Soft, nontender nondistended. Musculoskeletal: Left Knee swelling, Limited range of motion. Right knee with normal ROM Skin: no rashes, lesions, ulcers. No induration Neurologic: Facial asymmetry, moving extremity spontaneously, speech fluent. Psychiatric: Normal judgment and insight. Alert and oriented x 3. Normal mood.   Data Reviewed:    CBC    Component Value Date/Time   WBC 10.6 (H) 04/03/2024 0114   RBC 4.31 04/03/2024 0114   HGB 14.1 04/03/2024 0114   HGB 11.0 (L) 03/21/2023 1336   HGB 14.5 12/21/2015 0000   HCT 42.3 04/03/2024 0114   HCT 33.6 (L) 03/21/2023 1336   PLT 147 (L) 04/03/2024 0114   PLT 156 03/21/2023 1336   PLT 211 11/01/2012 0000   MCV 98.1 04/03/2024 0114   MCV 98 (H) 03/21/2023 1336   MCH 32.7 04/03/2024 0114   MCHC 33.3 04/03/2024 0114   RDW 12.7 04/03/2024 0114   RDW 13.1 03/21/2023  1336   LYMPHSABS 1.3 12/28/2023 0947   LYMPHSABS 1.9 08/22/2017 1627   MONOABS 0.6 12/28/2023 0947   EOSABS 0.0 12/28/2023 0947   EOSABS 0.1 08/22/2017 1627   BASOSABS 0.0 12/28/2023 0947   BASOSABS 0.0 08/22/2017 1627    CMP     Component Value Date/Time   NA 138 04/03/2024 0114   NA 142 04/05/2023 1140   K 3.7 04/03/2024 0114   K 3.9 12/20/2017 0000   CL 102 04/03/2024 0114    CO2 26 04/03/2024 0114   GLUCOSE 148 (H) 04/03/2024 0114   BUN 14 04/03/2024 0114   BUN 18 04/05/2023 1140   CREATININE 1.03 04/03/2024 0114   CREATININE 1.010 12/20/2017 0000   CREATININE 0.9 11/27/2013 0000   CALCIUM  8.7 (L) 04/03/2024 0114   PROT 7.0 04/03/2024 0114   ALBUMIN 3.7 04/03/2024 0114   ALBUMIN 4.8 07/27/2021 1435   AST 40 04/03/2024 0114   AST 19 12/20/2017 0000   ALT 16 04/03/2024 0114   ALT 18 12/20/2017 0000   ALKPHOS 53 04/03/2024 0114   ALKPHOS 64 11/27/2013 0000   BILITOT 1.9 (H) 04/03/2024 0114   GFR 77.17 12/28/2023 0947   EGFR 82 04/05/2023 1140   GFRNONAA >60 04/03/2024 0114    Family Communication: Updated patient on plan of care.  Time spent: 35 minutes.  Author: Landon FORBES Baller, MD 04/03/2024 12:40 PM  For on call review [email or web address].

## 2024-04-03 NOTE — Progress Notes (Signed)
° °  Brief Progress Note   _____________________________________________________________________________________________________________  Patient Name: [name] [name] Patient DOB: Jul 16, 1934 Date: @TODAY @      Data: Reviewed vital signs, labs, and notes.    Action: No action required at this time. No tele beds available at this time.    Response:    _____________________________________________________________________________________________________________  [facility] RN Expeditor Deshanda Molitor S Akai Dollard Please contact us  directly via secure chat (search for [facility]) or by calling us  at [phone] [facility] [place]).

## 2024-04-03 NOTE — Progress Notes (Signed)
 Echocardiogram 2D Echocardiogram has been performed.  [name] [name] 04/03/2024, 3:12 PM

## 2024-04-03 NOTE — Consult Note (Signed)
 Orthopaedic Trauma Service (OTS) Consult   Patient ID: [name] [name] MRN: [id number] DOB/AGE: [birth year] 89 y.o.  Reason for Consult:Left knee pain and swelling Referring Physician: Dr. Landon Baller, MD [facility]  HPI: [name] [name] is an 89 y.o. male who is being seen in consultation at the request of Dr.Dibia for evaluation of left knee swelling.  Patient had an episode where he was gathering his meals from [employer] and came into the house had a dizzy episode and tried to brace himself but he collapsed and feels that he might have twisted or injured his knee.  Has been swollen ever since.  He presented to the emergency room yesterday morning according to the documentation orthopedics was consulted but the orthopedic provider on-call yesterday states that he was never contacted.  As result I was contacted today for evaluation and aspiration of the knee.  Patient notes that he has knee arthritis and he has had aspirations performed by Arvella Fireman, PA-C who works with Dr. Mariea with [facility].  Every time that he gets an aspiration he feels better and is able to mobilize.  Denies any other injuries.  He states that he is otherwise ambulatory with his walker.  Past Medical History:  Diagnosis Date   Arthritis    Atrial fibrillation, permanent (HCC)    Bladder neoplasm    Chronic cough    NO CARDIAC OR PULMONARY RELATED   Coronary artery disease CARDIOLOGIST-  DR KLEIN/ [facility]   a. s/p CABG 2004, b. LHC with patent grafts 07/2005, ejection fraction 50%;  c. 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 Amiodarone  Rx    S/P CABG x 5 2004   S/P TAVR (transcatheter aortic valve replacement) 11/15/2022   s/p TAVR with a 29mm Edwards S3UR via the TF approach by Dr. Wonda & Maryjane.   Severe aortic stenosis     Past Surgical History:  Procedure Laterality Date   CARDIAC CATHETERIZATION  07-26-2005  DR GAMBLE   PRESERVED LVF/  EF 50%/ PATENT GRAFTS   CARDIAC CATHETERIZATION  03-04-2003  DR GAMBLE   SEVERE 3 VESSEL DISEASE   CARDIAC CATHETERIZATION  1991  DR [facility]   PAF/ FALSE POSITIVE STRESS TEST   CAROTID ENDARTERECTOMY Left 2022   CATARACT EXTRACTION W/ INTRAOCULAR LENS IMPLANT Right    CORONARY ARTERY BYPASS GRAFT  03-08-2003  DR [id number]   LIMA TO LAD/ SVG TO OM2/  SVG TO DIAGONAL / SVG TO PDA & PLA   CYSTOSCOPY WITH BIOPSY N/A 12/25/2012   Procedure: CYSTOSCOPY WITH BLADDER BIOPSY;  Surgeon: Donnice Gwenyth Brooks, MD;  Location: [facility];  Service: Urology;  Laterality: N/A;   ELECTROPHYSIOLOGY STUDY  07-27-2005  DR DANELLE TAYLOR   MAPPING --   RESULT NONINDUCIBLE VT OR SVT/  DX RIGHT VENTRICULAR OUTFLOW TRACT VT AND RULES OUT MORE MALIGNANT CAUSES OF VT   ENDARTERECTOMY Left 07/08/2020   Procedure: LEFT CAROTID ENDARTERECTOMY;  Surgeon: Serene Gaile LELON, MD;  Location: [facility] OR;  Service: Vascular;  Laterality: Left;   INGUINAL HERNIA REPAIR Bilateral 1954  &  1990   INTRAOPERATIVE TRANSTHORACIC ECHOCARDIOGRAM N/A 11/15/2022   Procedure: INTRAOPERATIVE TRANSTHORACIC ECHOCARDIOGRAM;  Surgeon: Wonda Sharper, MD;  Location: [facility] INVASIVE CV LAB;  Service: Open Heart Surgery;  Laterality: N/A;   KNEE ARTHROSCOPY Right 1994   LEFT HEART CATH AND CORS/GRAFTS ANGIOGRAPHY N/A 10/14/2022   Procedure: LEFT HEART CATH AND CORS/GRAFTS ANGIOGRAPHY;  Surgeon: Wonda Sharper, MD;  Location: [facility] INVASIVE CV LAB;  Service: Cardiovascular;  Laterality: N/A;   LUMBAR DISC SURGERY  1999   L4 -- L5   MOHS SURGERY     by Dr. Lyle 2019   PACEMAKER IMPLANT N/A 04/03/2023   Procedure: PACEMAKER  IMPLANT;  Surgeon: Fernande Elspeth BROCKS, MD;  Location: [facility] INVASIVE CV LAB;  Service: Cardiovascular;  Laterality: N/A;   REMOVAL VOCAL CORD POLYPS  1978   TONSILLECTOMY  AS CHILD   TRANSCATHETER AORTIC VALVE REPLACEMENT, TRANSFEMORAL N/A 11/15/2022   Procedure: Transcatheter Aortic Valve Replacement, Transfemoral;  Surgeon: Wonda Sharper, MD;  Location: [facility] INVASIVE CV LAB;  Service: Open Heart Surgery;  Laterality: N/A;   TRANSTHORACIC ECHOCARDIOGRAM  08/08/2011   MILD LVH/  EF 55-60%/  GRADE I DIASTOLIC DYSFUNCTION/ MILD AV STENOSIS    Family History  Problem Relation Age of Onset   Cancer Mother    Stroke Neg Hx     Social History:  reports that he quit smoking about 43 years ago. His smoking use included cigarettes. He started smoking about 68 years ago. He has a 25 pack-year smoking history. He has never used smokeless tobacco. He reports that he does not drink alcohol and does not use drugs.  Allergies:  Allergies  Allergen Reactions   Hydralazine  Hcl Other (See Comments)    Had severe headache all night.  Nightmares.  Drugged feeling.  Extreme dizziness   Ace Inhibitors Other (See Comments)    COUGH  Product containing angiotensin-converting enzyme inhibitor (product)   Zocor [Simvastatin] Other (See Comments)    MYALGIA   Nsaids Other (See Comments)    Would avoid given anticoagulation   Prednisone  Other (See Comments)    Prednisone  caused pt to have elevated BP.   Cephalexin Rash   Chocolate Other (See Comments)    Headaches    Doxycycline  Other (See Comments)    Nausea and abd pain   Gabapentin  Other (See Comments)    Dizziness   Hycodan [Hydrocodone  Bit-Homatrop Mbr] Nausea And Vomiting   Oxycodone  Nausea Only    Pt states that he cannot take prescription pain medication    Medications:  No current facility-administered medications on file prior to encounter.   Current Outpatient Medications on File Prior to Encounter  Medication Sig Dispense Refill    acetaminophen  (TYLENOL ) 500 MG tablet Take 500-1,000 mg by mouth every 6 (six) hours as needed (pain.).     apixaban  (ELIQUIS ) 5 MG TABS tablet Take 1 tablet (5 mg total) by mouth 2 (two) times daily. Take evening dose starting 07/09/20 180 tablet 3   butalbital -acetaminophen -caffeine  (FIORICET ) 50-325-40 MG tablet Take 1 tablet by mouth 3 (three) times daily as needed for headache.     cetirizine  (ZYRTEC ) 10 MG tablet Take 1 tablet (10 mg total) by mouth daily.     hydrochlorothiazide  (HYDRODIURIL ) 25 MG tablet Take 1 tablet (25 mg total) by mouth daily. 90 tablet 3   Iron , Ferrous Sulfate , 325 (65 Fe) MG TABS Take 325 mg by mouth daily.     latanoprost  (XALATAN ) 0.005 % ophthalmic solution Place 1 drop into both eyes at bedtime.  Polyethyl Glycol-Propyl Glycol (SYSTANE ULTRA OP) Place 1-2 drops into both eyes once as needed (dry eyes.).     potassium chloride  (KLOR-CON  M) 10 MEQ tablet Take 10 mEq by mouth in the morning.     pravastatin  (PRAVACHOL ) 80 MG tablet Take 1 tablet (80 mg total) by mouth every evening. 90 tablet 0     ROS: As documented above  Exam: Blood pressure (!) 142/68, pulse 66, temperature 98.3 F (36.8 C), temperature source Oral, resp. rate 19, height 5' 10 (1.778 m), weight 77 kg, SpO2 96%. General: No acute distress Orientation: Awake alert and oriented x 3 Mood and Affect: Cooperative and pleasant Gait: Unable to assess due to his pain Coordination and balance: Within normal limits  Left lower extremity: Large knee effusion with inability to straight leg raise.  Patella appears to be intact without any significant hematoma.  Patient has discomfort when he tries to move his knee.  Compartments are soft compressible.  He does have a abrasion and ecchymosis on his anterior medial knee.  Right lower extremity: Skin without lesions. No tenderness to palpation. Full painless ROM, full strength in each muscle groups without evidence of instability.   Medical Decision  Making: Data: Imaging: X-rays of the left knee showed no acute fracture but does show a large knee effusion.  CT scan shows no obvious fracture or large effusion intact quadriceps and patellar tendon from the CT scan.  Labs:  Results for orders placed or performed during the hospital encounter of 04/02/24 (from the past 24 hours)  Lipid panel     Status: None   Collection Time: 04/03/24  1:14 AM  Result Value Ref Range   Cholesterol 140 0 - 200 mg/dL   Triglycerides 41 <849 mg/dL   HDL 70 >59 mg/dL   Total CHOL/HDL Ratio 2.0 RATIO   VLDL 8 0 - 40 mg/dL   LDL Cholesterol 62 0 - 99 mg/dL  Hemoglobin J8r     Status: None   Collection Time: 04/03/24  1:14 AM  Result Value Ref Range   Hgb A1c MFr Bld 5.2 4.8 - 5.6 %   Mean Plasma Glucose 102.54 mg/dL  Comprehensive metabolic panel with GFR     Status: Abnormal   Collection Time: 04/03/24  1:14 AM  Result Value Ref Range   Sodium 138 135 - 145 mmol/L   Potassium 3.7 3.5 - 5.1 mmol/L   Chloride 102 98 - 111 mmol/L   CO2 26 22 - 32 mmol/L   Glucose, Bld 148 (H) 70 - 99 mg/dL   BUN 14 8 - 23 mg/dL   Creatinine, Ser 8.96 0.61 - 1.24 mg/dL   Calcium  8.7 (L) 8.9 - 10.3 mg/dL   Total Protein 7.0 6.5 - 8.1 g/dL   Albumin 3.7 3.5 - 5.0 g/dL   AST 40 15 - 41 U/L   ALT 16 0 - 44 U/L   Alkaline Phosphatase 53 38 - 126 U/L   Total Bilirubin 1.9 (H) 0.0 - 1.2 mg/dL   GFR, Estimated >39 >39 mL/min   Anion gap 10 5 - 15  CBC     Status: Abnormal   Collection Time: 04/03/24  1:14 AM  Result Value Ref Range   WBC 10.6 (H) 4.0 - 10.5 K/uL   RBC 4.31 4.22 - 5.81 MIL/uL   Hemoglobin 14.1 13.0 - 17.0 g/dL   HCT 57.6 60.9 - 47.9 %   MCV 98.1 80.0 - 100.0 fL   MCH 32.7 26.0 - 34.0  pg   MCHC 33.3 30.0 - 36.0 g/dL   RDW 87.2 88.4 - 84.4 %   Platelets 147 (L) 150 - 400 K/uL   nRBC 0.0 0.0 - 0.2 %   *Note: Due to a large number of results and/or encounters for the requested time period, some results have not been displayed. A complete set of  results can be found in Results Review.     Imaging or Labs ordered: None  Medical history and chart was reviewed and case discussed with medical provider.  Assessment/Plan: 89 year old male with left knee arthritis with a large knee effusion.  Patient was consented for left knee aspiration.  Please see procedure note below.  He did have a large hemarthrosis with blood in his fluid.  Based on the CT scan I did not see a nondisplaced fracture but more than likely has an occult fracture that was not identified on the imaging.  His quadriceps tendon appears to be intact.  Hopefully will start to feel better with the aspiration.  He can continue weightbearing as tolerated.  Will follow-up with him if he continues to have limitations in his ability ambulate I would recommend obtaining an MRI of his knee to evaluate for an occult fracture versus tendon/ligament disruption.  Procedure: Verbal consent was obtained and a timeout was performed the knee was cleansed and aspirated with an 18-gauge needle in the suprapatellar pouch.  Approximately 80 cc of bloody fluid was aspirated from the knee.  No signs of any infection.  Patient tolerated well and felt better after the aspiration.  New problem w/ workup planned: High complexity diagnosis (Level 5)  Franky MYRTIS Light, MD [facility] [phone] (office) [email or web address]

## 2024-04-04 ENCOUNTER — Other Ambulatory Visit: Payer: Self-pay

## 2024-04-04 ENCOUNTER — Encounter (HOSPITAL_COMMUNITY): Payer: Self-pay | Admitting: Internal Medicine

## 2024-04-04 ENCOUNTER — Inpatient Hospital Stay (HOSPITAL_COMMUNITY)

## 2024-04-04 DIAGNOSIS — Z87891 Personal history of nicotine dependence: Secondary | ICD-10-CM | POA: Diagnosis not present

## 2024-04-04 DIAGNOSIS — S83242A Other tear of medial meniscus, current injury, left knee, initial encounter: Secondary | ICD-10-CM | POA: Diagnosis not present

## 2024-04-04 DIAGNOSIS — M1712 Unilateral primary osteoarthritis, left knee: Secondary | ICD-10-CM | POA: Diagnosis present

## 2024-04-04 DIAGNOSIS — I2510 Atherosclerotic heart disease of native coronary artery without angina pectoris: Secondary | ICD-10-CM | POA: Diagnosis present

## 2024-04-04 DIAGNOSIS — E785 Hyperlipidemia, unspecified: Secondary | ICD-10-CM | POA: Diagnosis present

## 2024-04-04 DIAGNOSIS — M23222 Derangement of posterior horn of medial meniscus due to old tear or injury, left knee: Secondary | ICD-10-CM | POA: Diagnosis present

## 2024-04-04 DIAGNOSIS — Z881 Allergy status to other antibiotic agents status: Secondary | ICD-10-CM | POA: Diagnosis not present

## 2024-04-04 DIAGNOSIS — S82142A Displaced bicondylar fracture of left tibia, initial encounter for closed fracture: Secondary | ICD-10-CM | POA: Diagnosis present

## 2024-04-04 DIAGNOSIS — Z8673 Personal history of transient ischemic attack (TIA), and cerebral infarction without residual deficits: Secondary | ICD-10-CM | POA: Diagnosis not present

## 2024-04-04 DIAGNOSIS — I10 Essential (primary) hypertension: Secondary | ICD-10-CM | POA: Diagnosis present

## 2024-04-04 DIAGNOSIS — R29702 NIHSS score 2: Secondary | ICD-10-CM | POA: Diagnosis present

## 2024-04-04 DIAGNOSIS — I495 Sick sinus syndrome: Secondary | ICD-10-CM | POA: Diagnosis present

## 2024-04-04 DIAGNOSIS — Y92015 Private garage of single-family (private) house as the place of occurrence of the external cause: Secondary | ICD-10-CM | POA: Diagnosis not present

## 2024-04-04 DIAGNOSIS — R7989 Other specified abnormal findings of blood chemistry: Secondary | ICD-10-CM | POA: Diagnosis present

## 2024-04-04 DIAGNOSIS — M25062 Hemarthrosis, left knee: Secondary | ICD-10-CM | POA: Diagnosis present

## 2024-04-04 DIAGNOSIS — R42 Dizziness and giddiness: Secondary | ICD-10-CM | POA: Diagnosis present

## 2024-04-04 DIAGNOSIS — M948X6 Other specified disorders of cartilage, lower leg: Secondary | ICD-10-CM | POA: Diagnosis not present

## 2024-04-04 DIAGNOSIS — Z66 Do not resuscitate: Secondary | ICD-10-CM | POA: Diagnosis present

## 2024-04-04 DIAGNOSIS — I951 Orthostatic hypotension: Secondary | ICD-10-CM | POA: Diagnosis present

## 2024-04-04 DIAGNOSIS — K219 Gastro-esophageal reflux disease without esophagitis: Secondary | ICD-10-CM | POA: Diagnosis present

## 2024-04-04 DIAGNOSIS — I6381 Other cerebral infarction due to occlusion or stenosis of small artery: Secondary | ICD-10-CM | POA: Diagnosis present

## 2024-04-04 DIAGNOSIS — S80212A Abrasion, left knee, initial encounter: Secondary | ICD-10-CM | POA: Diagnosis present

## 2024-04-04 DIAGNOSIS — I4821 Permanent atrial fibrillation: Secondary | ICD-10-CM | POA: Diagnosis present

## 2024-04-04 DIAGNOSIS — Z952 Presence of prosthetic heart valve: Secondary | ICD-10-CM | POA: Diagnosis not present

## 2024-04-04 DIAGNOSIS — I639 Cerebral infarction, unspecified: Secondary | ICD-10-CM | POA: Diagnosis not present

## 2024-04-04 DIAGNOSIS — I447 Left bundle-branch block, unspecified: Secondary | ICD-10-CM | POA: Diagnosis present

## 2024-04-04 DIAGNOSIS — W108XXA Fall (on) (from) other stairs and steps, initial encounter: Secondary | ICD-10-CM | POA: Diagnosis present

## 2024-04-04 DIAGNOSIS — I25810 Atherosclerosis of coronary artery bypass graft(s) without angina pectoris: Secondary | ICD-10-CM | POA: Diagnosis not present

## 2024-04-04 DIAGNOSIS — Z8551 Personal history of malignant neoplasm of bladder: Secondary | ICD-10-CM | POA: Diagnosis not present

## 2024-04-04 DIAGNOSIS — Z950 Presence of cardiac pacemaker: Secondary | ICD-10-CM | POA: Diagnosis not present

## 2024-04-04 DIAGNOSIS — Z7901 Long term (current) use of anticoagulants: Secondary | ICD-10-CM | POA: Diagnosis not present

## 2024-04-04 MED ORDER — HYDROCHLOROTHIAZIDE 25 MG PO TABS
25.0000 mg | ORAL_TABLET | Freq: Every day | ORAL | Status: DC
  Administered 2024-04-04 – 2024-04-05 (×2): 25 mg via ORAL
  Filled 2024-04-04 (×2): qty 1

## 2024-04-04 NOTE — Progress Notes (Signed)
 SLP Cancellation Note  Patient Details Name: [name] [name] MRN: [id number] DOB: 1934-06-24   Cancelled treatment:       Reason Eval/Treat Not Completed: Patient's level of consciousness. Pt drifted off to sleep in the middle of talking x2. Will attempt again when possible.    Arabia Nylund, Consuelo Fitch 04/04/2024, 3:27 PM

## 2024-04-04 NOTE — Progress Notes (Signed)
 Physical Therapy Treatment Patient Details Name: [name] [name] MRN: [id number] DOB: October 05, 1934 Today's Date: 04/04/2024   History of Present Illness 89 year old presented to [facility] on  04/02/24 for dizziness and fall. MRI with acute punctate infarcts in bilateral frontal lobes. PMH - afib, htn, cabg, TAVR, CVA, bladder CA.    PT Comments  Pt significantly limited by pain throughout session. Pt's L knee aspirated yesterday, pt reports initial improved pain but symptoms have returned and continue to limit mobility. Pt with greatest difficulty in weight bearing and active/passive knee extension. Pt requires modA for bed mobility but tolerates dangling EOB well. Pt sits EOB ~15 minutes during session. PROM knee extension attempted while sitting EOB but pt was guarding throughout. STS attempted but pt was unable to tolerate and was leaning to the R to minimize weight bearing through L LE. Camie stedy considered, however, with pt's level of pain when attempting to stand, pt likely would not tolerate the transfer at this time. Pt would benefit from continued PT services focused on bed mobility, ROM, strengthening, and transfers to progress tolerance to functional mobility.    If plan is discharge home, recommend the following: A lot of help with walking and/or transfers;A little help with bathing/dressing/bathroom;Assist for transportation;Help with stairs or ramp for entrance;Assistance with cooking/housework   Can travel by private vehicle        Equipment Recommendations  None recommended by PT    Recommendations for Other Services       Precautions / Restrictions Precautions Precautions: Fall Recall of Precautions/Restrictions: Intact Restrictions Weight Bearing Restrictions Per Provider Order: No     Mobility  Bed Mobility Overal bed mobility: Needs Assistance Bed Mobility: Supine to Sit, Sit to Supine     Supine to sit: Mod assist (LE management) Sit to supine: Mod assist (LE  management)   General bed mobility comments: Nurse tech present to assist with boost in bed, pt unable to reposition self in bed due to pain with weight bearing.    Transfers Overall transfer level: Needs assistance Equipment used: 1 person hand held assist Transfers: Sit to/from Stand Sit to Stand: Max assist           General transfer comment: STS attempted, but pt was unable to achieve full stance due to pain in weight bearing/knee extension. Pt weight bearing in crouched position, R lean noted to offload L LE.    Ambulation/Gait                   Stairs             Wheelchair Mobility     Tilt Bed    Modified Rankin (Stroke Patients Only)       Balance Overall balance assessment: Modified Independent Sitting-balance support: Single extremity supported Sitting balance-Leahy Scale: Good         Standing balance comment: Standing balance unable to be assessed, unable to tolerate standing at this time.                            Communication Communication Communication: Impaired Factors Affecting Communication: Hearing impaired  Cognition Arousal: Alert Behavior During Therapy: WFL for tasks assessed/performed   PT - Cognitive impairments: Other                       PT - Cognition Comments: Some difficulty with word finding and had difficulty recalling topic of converation  during session. Following commands: Intact Following commands impaired: Follows multi-step commands with increased time    Cueing Cueing Techniques: Verbal cues, Tactile cues, Visual cues  Exercises General Exercises - Lower Extremity Ankle Circles/Pumps: AROM, Strengthening, Both, 5 reps Short Arc Quad: AAROM, Left, 5 reps, Seated (Pt with significant difficulty tolerating L knee extension, guarding throughout the motion.)    General Comments General comments (skin integrity, edema, etc.): L knee swollen and tender to touch. VSS throughout.       Pertinent Vitals/Pain Pain Assessment Pain Assessment: 0-10 Pain Score: 8  Pain Location: L knee Pain Descriptors / Indicators: Grimacing, Guarding, Sharp, Sore, Stabbing Pain Intervention(s): Limited activity within patient's tolerance, Repositioned, Monitored during session, Premedicated before session    Home Living                          Prior Function            PT Goals (current goals can now be found in the care plan section) Acute Rehab PT Goals Patient Stated Goal: Decrease pain PT Goal Formulation: With patient Time For Goal Achievement: 04/18/24 Potential to Achieve Goals: Good Progress towards PT goals: Progressing toward goals    Frequency    Min 3X/week      PT Plan      Co-evaluation              AM-PAC PT 6 Clicks Mobility   Outcome Measure  Help needed turning from your back to your side while in a flat bed without using bedrails?: A Little Help needed moving from lying on your back to sitting on the side of a flat bed without using bedrails?: A Lot Help needed moving to and from a bed to a chair (including a wheelchair)?: Total Help needed standing up from a chair using your arms (e.g., wheelchair or bedside chair)?: Total Help needed to walk in hospital room?: Total Help needed climbing 3-5 steps with a railing? : Total 6 Click Score: 9    End of Session Equipment Utilized During Treatment: Gait belt Activity Tolerance: Patient limited by pain Patient left: in bed;with call bell/phone within reach;with bed alarm set;with nursing/sitter in room Nurse Communication: Mobility status PT Visit Diagnosis: Unsteadiness on feet (R26.81);Other abnormalities of gait and mobility (R26.89);Pain Pain - Right/Left: Left Pain - part of body: Knee     Time: 9144-9067 PT Time Calculation (min) (ACUTE ONLY): 37 min  Charges:    $Therapeutic Exercise: 8-22 mins $Therapeutic Activity: 8-22 mins PT General Charges $$ ACUTE PT  VISIT: 1 Visit                     [name], PT, DPT  Acute Rehabilitation Services         Office: [phone]     [name] 04/04/2024, 12:58 PM

## 2024-04-04 NOTE — Progress Notes (Signed)
 Orthopaedic Trauma Progress Note  SUBJECTIVE: Patient underwent left knee arthrocentesis yesterday with 80 cc of bloody fluid aspirated from the knee.  There was no signs of infection.  Patient does have significant arthritis in the left knee at baseline which he has undergone multiple arthrocenteses in the past with good relief. Unfortunately, patient continued to have significant left knee pain following yesterday's procedure.  Existing imaging has been reviewed and I do not appreciate any acute bony abnormalities.   Patient continues to note notable pain in the left knee this morning with increased swelling.  Notes he is having difficulty with motion and weightbearing. Even before this hospitalization patient has been wearing a compressive knee sleeve to assist with arthritic related swelling and pain. He notes the compression normally helps him but does not currently feel that is is effective.  No chest pain. No SOB. No nausea/vomiting. No other complaints.   OBJECTIVE:  Vitals:   04/04/24 0508 04/04/24 0521  BP: (!) 187/92   Pulse:  84  Resp:  20  Temp: 99 F (37.2 C)   SpO2:  93%    Opiates Today (MME): Today's  total administered Morphine  Milligram Equivalents: 7.5 Opiates Yesterday (MME): Yesterday's total administered Morphine  Milligram Equivalents: 27.5  General: Sitting up in bed, NAD Respiratory: No increased work of breathing.  Operative Extremity (LLE): Compressive knee sleeve on knee, removed for exam. Moderate knee effusion. Pain with palpation about the knee. Holds knee in slight flexed position, increased pain in the knee with motion. Ankle DF/PF intact. Endorses sensation throughout extremity. +DP pulse  IMAGING: X-ray and CT scan left knee show tricompartmental osteoarthritis.  No acute bony abnormalities including fractures or dislocations appreciated.  LABS: No results found. However, due to the size of the patient record, not all encounters were searched. Please check  Results Review for a complete set of results.  ASSESSMENT: RAJOHN HENERY is a 89 y.o. male with known tricompartmental osteoarthritis of the left knee s/p arthrocentesis with 80 cc of bloody joint fluid aspirated on 04/03/2024  CV/Blood loss: Hgb 14.1 on 04/03/24. Hemodynamically stable  PLAN: Weightbearing: WBAT LLE ROM: Unrestricted ROM Incisional and dressing care: Change bandage to the left knee as needed Orthopedic device(s): None  Pain management:  1. Tylenol  650 mg q 4 hours scheduled 2.  Flexeril  7.5 mg 3 times daily PRN 3. Oxycodone  5 mg q 4 hours PRN 4. Dilaudid  0.5-1 mg q 4 hours PRN VTE prophylaxis: Eliquis  Dispo: Patient with continued left knee pain.  Current imaging does not show any acute bony abnormalities.  Aspirated joint fluid was noted to be bloody yesterday.  Recommend MRI of the left knee to evaluate for occult fracture versus tendon/ligament disruption.  Will order that today and follow-up on results once available   Follow - up plan: To be determined   Contact information:  Franky Light MD, Lauraine Moores PA-C. After hours and holidays please check [email or web address] for group call information for [facility]   Lauraine PATRIC Moores, PA-C [phone] (office) [email or web address]

## 2024-04-04 NOTE — TOC Progression Note (Signed)
 Transition of Care [facility]) - Progression Note    Patient Details  Name: [name] [name] MRN: [id number] Date of Birth: 07-17-1934  Transition of [facility] CM/SW Contact  Luca Dyar SHAUNNA [place], [place] Phone Number: 04/04/2024, 11:40 AM  Clinical Narrative:       CSW spoke with pt son Alm who confirmed that family would like pt to go to [facility] for rehab.   CSW completed fl2 and sent bed request to [facility]. Spkoe with Alfonso at [facility] who has already been in contact with pt's family and confirmed pt can admit to their rehab when he is ready.  Pt will admit under [facility] 3-day waiver.  CSW emailed [facility] to notify that pt will be using waiver.      Social Drivers of Health (SDOH) Interventions SDOH Screenings   Food Insecurity: No Food Insecurity (04/04/2024)  Housing: Unknown (04/04/2024)  Transportation Needs: No Transportation Needs (04/04/2024)  Utilities: Not At Risk (04/04/2024)  Alcohol Screen: Low Risk (02/13/2023)  Depression (PHQ2-9): Medium Risk (08/21/2023)  Financial Resource Strain: Low Risk (02/13/2023)  Physical Activity: Insufficiently Active (02/13/2023)  Social Connections: Moderately Integrated (02/13/2023)  Stress: No Stress Concern Present (02/13/2023)  Tobacco Use: Medium Risk (01/03/2024)  Health Literacy: Adequate Health Literacy (02/13/2023)    Readmission Risk Interventions     No data to display

## 2024-04-04 NOTE — Plan of Care (Signed)

## 2024-04-04 NOTE — Discharge Instructions (Signed)

## 2024-04-04 NOTE — [place] FL2 (Signed)
 [place]  MEDICAID FL2 LEVEL OF CARE FORM     IDENTIFICATION  Patient Name: [name] [name] Birthdate: 1935-03-14 Sex: male Admission Date (Current Location): 04/02/2024  [facility] and [place] Number:  Producer, Television/film/video and Address:  [facility], [address], [place], [place] [address]      Provider Number: [id number]  Attending Physician Name and Address:  Raenelle Donalda HERO, MD  Relative Name and Phone Number:  EMONI, YANG, Emergency Contact  [phone] (Mobile)    Current Level of Care: Hospital Recommended Level of Care: Skilled Nursing Facility Prior Approval Number:    Date Approved/Denied:   PASRR Number: [id number]  Discharge Plan: SNF    Current Diagnoses: Patient Active Problem List   Diagnosis Date Noted   Acute CVA (cerebrovascular accident) (HCC) 04/02/2024   Shingles 12/08/2023   Sinus pressure 06/25/2023   History of CVA in adulthood 01/23/2023   Pain of lower extremity 01/23/2023   RBBB 11/15/2022   S/P TAVR (transcatheter aortic valve replacement) 11/15/2022   DNR (do not resuscitate) 08/08/2022   Permanent atrial fibrillation (HCC) 02/22/2022   Chronic pain of both knees 12/25/2021   Bilateral leg pain 09/28/2021   Advance care planning 10/28/2020   Bilateral pseudophakia 07/27/2020   Carpal tunnel syndrome 07/27/2020   Dermatochalasis 07/27/2020   Hematuria 07/27/2020   Insomnia 07/27/2020   Radiculopathy 07/27/2020   Stenosis of internal carotid artery with cerebral infarction, right (HCC) 07/08/2020   Imbalance 03/27/2020   Retinal artery occlusion 03/10/2020   Constipation 12/22/2019   Rhinitis sicca 05/22/2018   Sciatica 12/28/2017   Neuropathy 10/07/2017   Foot swelling 10/07/2017   CAD (coronary artery disease), autologous vein bypass graft 10/31/2016   Family history of colon cancer 10/31/2016   History of adenomatous polyp of colon 10/31/2016   Osteoarthritis 10/31/2016   Skin lesion  06/04/2015   Elevated TSH 12/26/2014   Severe aortic stenosis 05/16/2013   Bladder neoplasm 11/23/2012   BCC (basal cell carcinoma of skin) 08/29/2012   Dizziness 08/24/2012   GERD (gastroesophageal reflux disease) 05/01/2012   Hyperlipidemia 03/14/2012   TMJ pain dysfunction syndrome 01/20/2012   Long term (current) use of anticoagulants 08/31/2011   RVOT ventricular tachycardia (HCC)    Cough 04/29/2009   HEARING LOSS, SUDDEN 12/03/2007   HTN (hypertension) 02/22/2007   S/P CABG x 5 2004    Orientation RESPIRATION BLADDER Height & Weight     Self, Time, Situation, Place  Normal Incontinent Weight: 169 lb 12.1 oz (77 kg) Height:  5' 10 (177.8 cm)  BEHAVIORAL SYMPTOMS/MOOD NEUROLOGICAL BOWEL NUTRITION STATUS      Continent Diet (see d/c summary)  AMBULATORY STATUS COMMUNICATION OF NEEDS Skin   Extensive Assist Verbally Normal                       Personal Care Assistance Level of Assistance  Bathing, Feeding, Dressing Bathing Assistance: Maximum assistance Feeding assistance: Independent Dressing Assistance: Limited assistance     Functional Limitations Info  Sight, Hearing, Speech Sight Info: Adequate Hearing Info: Impaired Speech Info: Adequate    SPECIAL CARE FACTORS FREQUENCY  PT (By licensed PT), OT (By licensed OT)     PT Frequency: 5x/week OT Frequency: 5x/week            Contractures Contractures Info: Not present    Additional Factors Info  Code Status, Allergies Code Status Info: DNR-limited Allergies Info: Hydralazine  Hcl, Ace inhibitors, NSAIDS, Zocor (simvastatin), prednisone ,  Cephalexin, chocolate, Doxycycline , gabapentin , Hycodan (hydrocodone  Bit-homatrop Mbr), oxycodone            Current Medications (04/04/2024):  This is the current hospital active medication list Current Facility-Administered Medications  Medication Dose Route Frequency Provider Last Rate Last Admin   acetaminophen  (TYLENOL ) tablet 650 mg  650 mg Oral Q4H PRN  Melvin, Alexander B, MD   650 mg at 04/03/24 1630   Or   acetaminophen  (TYLENOL ) 160 MG/5ML solution 650 mg  650 mg Per Tube Q4H PRN Melvin, Alexander B, MD       Or   acetaminophen  (TYLENOL ) suppository 650 mg  650 mg Rectal Q4H PRN Melvin, Alexander B, MD       apixaban  (ELIQUIS ) tablet 5 mg  5 mg Oral BID Garrick Leontine SAILOR, PA-C   5 mg at 04/04/24 0843   butalbital -acetaminophen -caffeine  (FIORICET ) 50-325-40 MG per tablet 1 tablet  1 tablet Oral TID PRN Melvin, Alexander B, MD       cyclobenzaprine  (FLEXERIL ) tablet 7.5 mg  7.5 mg Oral TID PRN Dibia, Pauline E, MD   7.5 mg at 04/03/24 2016   latanoprost  (XALATAN ) 0.005 % ophthalmic solution 1 drop  1 drop Both Eyes QHS Melvin, Alexander B, MD       oxyCODONE  (Oxy IR/ROXICODONE ) immediate release tablet 5 mg  5 mg Oral Q4H PRN Dibia, Pauline E, MD   5 mg at 04/04/24 0818   pravastatin  (PRAVACHOL ) tablet 80 mg  80 mg Oral QPM Melvin, Alexander B, MD   80 mg at 04/03/24 1847   senna-docusate (Senokot-S) tablet 1 tablet  1 tablet Oral QHS PRN Melvin, Alexander B, MD         Discharge Medications: Please see discharge summary for a list of discharge medications.  Relevant Imaging Results:  Relevant Lab Results:   Additional Information SSN [id number]  Court Gracia SHAUNNA Cumming, LCSW

## 2024-04-04 NOTE — Progress Notes (Signed)
 Inpatient Rehab Coordinator Note:  I spoke with patient's son over the phone to discuss CIR recommendations and goals/expectations of CIR stay.  We reviewed 3 hrs/day of therapy, physician follow up, and average length of stay 2 weeks (dependent upon progress) with goals of supervision.  Son reports he and family have been talking to pt about transition to a facility for more care.  They are interested in rehab at [facility] as they have personal experience there.  I will pass along to LCSW to work on that transition for them.  I will be available if needed.   Reche Lowers, PT, DPT Admissions Coordinator [phone] 04/04/2024 10:45 AM

## 2024-04-04 NOTE — Progress Notes (Signed)
 PROGRESS NOTE        PATIENT DETAILS Name: [name] [name] Age: 89 y.o. Sex: male Date of Birth: 1934-08-21 Admit Date: 04/02/2024 Admitting Physician [name] KATHEE Scurry, MD ERE:Ilwrjw, Arlyss RAMAN, MD  Brief Summary: Patient is a 89 y.o.  male with history of A-fib on Eliquis , CAD s/p CABG, aortic stenosis s/p TAVR 2024, CHB s/p single-chamber PPM December 2024, HTN who had dizziness-and fell and landed on his left knee.  Upon further evaluation-he was found to have acute CVA and left knee hemarthrosis.   Significant events: 12/9>> admit to [facility]  Significant studies: 12/9>> x-ray hip/left pelvis: No fracture 12/9>> x-ray left knee: No fracture-tricompartment OA. 12/9>> CXR: No pneumonia 12/9>> MRI brain: Acute infarct in the left frontal white matter-2 acute infarct in the right frontal white matter. 12/9>> CT of the left knee: No fracture/dislocation, moderate to large joint effusion. 12/9>> CT angio head/neck: No LVO/hemodynamically significant stenosis 12/10>> echo: EF 45-50% 12/10>> A1c: 5.2 12/10>> LDL: 62  Significant microbiology data: None  Procedures: None  Consults: Neurology Orthopedics  Subjective: Complains of persistent-severe left knee pain  Objective: Vitals: Blood pressure (!) 187/92, pulse 84, temperature 99 F (37.2 C), temperature source Axillary, resp. rate 20, height 5' 10 (1.778 m), weight 77 kg, SpO2 93%.   Exam: Gen Exam:Alert awake-not in any distress HEENT:atraumatic, normocephalic Chest: B/L clear to auscultation anteriorly CVS:S1S2 regular Abdomen:soft non tender, non distended Extremities: Left knee in compressive bandage-but appears tender with moderate palpation. Neurology: Non focal Skin: no rash  Pertinent Labs/Radiology:    Latest Ref Rng & Units 04/03/2024    1:14 AM 04/02/2024    8:45 AM 01/01/2024    9:36 AM  CBC  WBC 4.0 - 10.5 K/uL 10.6  7.5  4.8   Hemoglobin 13.0 - 17.0 g/dL 85.8  85.4   86.3   Hematocrit 39.0 - 52.0 % 42.3  42.5  41.5   Platelets 150 - 400 K/uL 147  135  151     Lab Results  Component Value Date   NA 138 04/03/2024   K 3.7 04/03/2024   CL 102 04/03/2024   CO2 26 04/03/2024      Assessment/Plan: Acute CVA Felt to be embolic Workup as above Eliquis  Neurology following PT/OT/CIR team/TOC following-recommendations are for SNF.  Left knee hemarthrosis S/p arthrocentesis by orthopedics at bedside on 12/7-frank blood obtained-suspicion is that he may have fallen and landed on his left knee when he had his CVA (on Eliquis ).  Since continues to have pain-unable to ambulate steadily due to pain-orthopedic service has ordered an MRI As needed oxycodone  for pain Orthopedics recommending WBAT LLE  Permanent atrial fibrillation Paced rhythm Eliquis   Complete heart block-S/p single-chamber pacemaker implantation Telemetry monitoring  History of severe arctic stenosis-s/p TAVR Supportive care  CAD s/p CABG No anginal symptoms  HTN BP creeping up Resume home HCTZ dosage Follow/optimize  HLD Statin  Code status:   Code Status: Limited: Do not attempt resuscitation (DNR) -DNR-LIMITED -Do Not Intubate/DNI    DVT Prophylaxis: apixaban  (ELIQUIS ) tablet 5 mg    Family Communication: None at bedside   Disposition Plan: Status is: Observation The patient will require care spanning > 2 midnights and should be moved to inpatient because: Severity of illness   Planned Discharge Destination:Skilled nursing facility   Diet: Diet Order  Diet regular Room service appropriate? Yes; Fluid consistency: Thin  Diet effective now                     Antimicrobial agents: Anti-infectives (From admission, onward)    None        MEDICATIONS: Scheduled Meds:  apixaban   5 mg Oral BID   latanoprost   1 drop Both Eyes QHS   pravastatin   80 mg Oral QPM   Continuous Infusions: PRN Meds:.acetaminophen  **OR** acetaminophen   (TYLENOL ) oral liquid 160 mg/5 mL **OR** acetaminophen , butalbital -acetaminophen -caffeine , cyclobenzaprine , oxyCODONE , senna-docusate   I have personally reviewed following labs and imaging studies  LABORATORY DATA: CBC: Recent Labs  Lab 04/02/24 0845 04/03/24 0114  WBC 7.5 10.6*  HGB 14.5 14.1  HCT 42.5 42.3  MCV 99.3 98.1  PLT 135* 147*    Basic Metabolic Panel: Recent Labs  Lab 04/02/24 0845 04/03/24 0114  NA 140 138  K 3.3* 3.7  CL 101 102  CO2 29 26  GLUCOSE 99 148*  BUN 14 14  CREATININE 0.77 1.03  CALCIUM  9.3 8.7*  MG 2.1  --     GFR: Estimated Creatinine Clearance: 52.2 mL/min (by C-G formula based on SCr of 1.03 mg/dL).  Liver Function Tests: Recent Labs  Lab 04/02/24 0845 04/03/24 0114  AST 28 40  ALT 15 16  ALKPHOS 62 53  BILITOT 1.6* 1.9*  PROT 7.1 7.0  ALBUMIN 3.9 3.7   No results for input(s): LIPASE, AMYLASE in the last 168 hours. No results for input(s): AMMONIA in the last 168 hours.  Coagulation Profile: Recent Labs  Lab 04/02/24 0845  INR 1.3*    Cardiac Enzymes: No results for input(s): CKTOTAL, CKMB, CKMBINDEX, TROPONINI in the last 168 hours.  BNP (last 3 results) No results for input(s): PROBNP in the last 8760 hours.  Lipid Profile: Recent Labs    04/03/24 0114  CHOL 140  HDL 70  LDLCALC 62  TRIG 41  CHOLHDL 2.0    Thyroid  Function Tests: No results for input(s): TSH, T4TOTAL, FREET4, T3FREE, THYROIDAB in the last 72 hours.  Anemia Panel: No results for input(s): VITAMINB12, FOLATE, FERRITIN, TIBC, IRON , RETICCTPCT in the last 72 hours.  Urine analysis:    Component Value Date/Time   COLORURINE YELLOW 04/02/2024 0845   APPEARANCEUR CLEAR 04/02/2024 0845   LABSPEC 1.011 04/02/2024 0845   PHURINE 7.0 04/02/2024 0845   GLUCOSEU NEGATIVE 04/02/2024 0845   HGBUR SMALL (A) 04/02/2024 0845   BILIRUBINUR NEGATIVE 04/02/2024 0845   KETONESUR NEGATIVE 04/02/2024 0845    PROTEINUR NEGATIVE 04/02/2024 0845   NITRITE NEGATIVE 04/02/2024 0845   LEUKOCYTESUR NEGATIVE 04/02/2024 0845    Sepsis Labs: Lactic Acid, Venous No results found for: LATICACIDVEN  MICROBIOLOGY: No results found for this or any previous visit (from the past 240 hours).  RADIOLOGY STUDIES/RESULTS: ECHOCARDIOGRAM COMPLETE BUBBLE STUDY Result Date: 04/03/2024    ECHOCARDIOGRAM REPORT   Patient Name:   [name] [name] Date of Exam: 04/03/2024 Medical Rec #:  [id number]           Height:       70.0 in Accession #:    [id number]          Weight:       169.8 lb Date of Birth:  12/10/1934           BSA:          1.947 m Patient Age:    3 years  BP:           150/74 mmHg Patient Gender: M                   HR:           58 bpm. Exam Location:  Inpatient Procedure: 2D Echo, Cardiac Doppler, Color Doppler and Saline Contrast Bubble            Study (Both Spectral and Color Flow Doppler were utilized during            procedure). Indications:    Stroke  History:        Patient has prior history of Echocardiogram examinations, most                 recent 10/26/2023. CAD, Arrythmias:Atrial Fibrillation; Risk                 Factors:Hypertension, Dyslipidemia and Former Smoker.                 Aortic Valve: 29 mm Sapien prosthetic, stented (TAVR) valve is                 present in the aortic position. Procedure Date: 11/15/2022.  Sonographer:    Merlynn Argyle Referring Phys: [id number] [name] NOVAK MELVIN  Sonographer Comments: Image acquisition challenging due to respiratory motion. IMPRESSIONS  1. Left ventricular ejection fraction, by estimation, is 45 to 50%. The left ventricle has mildly decreased function. The left ventricle demonstrates regional wall motion abnormalities (see scoring diagram/findings for description). The left ventricular  internal cavity size was mildly dilated. There is mild concentric left ventricular hypertrophy. Left ventricular diastolic parameters are indeterminate.  2. Right  ventricular systolic function is normal. The right ventricular size is mildly enlarged. There is mildly elevated pulmonary artery systolic pressure. The estimated right ventricular systolic pressure is 37.4 mmHg.  3. Left atrial size was severely dilated.  4. The mitral valve is degenerative. Mild mitral valve regurgitation. No evidence of mitral stenosis. Severe mitral annular calcification.  5. Mild perivalvular leak. The aortic valve has been repaired/replaced. Aortic valve regurgitation is mild. No aortic stenosis is present. There is a 29 mm Sapien prosthetic (TAVR) valve present in the aortic position. Procedure Date: 11/15/2022. Echo findings are consistent with perivalvular leak of the aortic prosthesis.  6. Aortic dilatation noted. There is mild dilatation of the ascending aorta, measuring 42 mm.  7. The inferior vena cava is normal in size with greater than 50% respiratory variability, suggesting right atrial pressure of 3 mmHg.  8. Agitated saline contrast bubble study was negative, with no evidence of any interatrial shunt. Conclusion(s)/Recommendation(s): No intracardiac source of embolism detected on this transthoracic study. Consider a transesophageal echocardiogram to exclude cardiac source of embolism if clinically indicated. FINDINGS  Left Ventricle: Left ventricular ejection fraction, by estimation, is 45 to 50%. The left ventricle has mildly decreased function. The left ventricle demonstrates regional wall motion abnormalities. The left ventricular internal cavity size was mildly dilated. There is mild concentric left ventricular hypertrophy. Abnormal (paradoxical) septal motion, consistent with left bundle branch block. Left ventricular diastolic parameters are indeterminate.  LV Wall Scoring: The mid inferoseptal segment is hypokinetic. Right Ventricle: The right ventricular size is mildly enlarged. No increase in right ventricular wall thickness. Right ventricular systolic function is normal.  There is mildly elevated pulmonary artery systolic pressure. The tricuspid regurgitant velocity is 2.71 m/s, and with an assumed right atrial pressure of 8 mmHg, the estimated right  ventricular systolic pressure is 37.4 mmHg. Left Atrium: Left atrial size was severely dilated. Right Atrium: Right atrial size was normal in size. Pericardium: There is no evidence of pericardial effusion. Mitral Valve: The mitral valve is degenerative in appearance. Severe mitral annular calcification. Mild mitral valve regurgitation. No evidence of mitral valve stenosis. MV peak gradient, 5.0 mmHg. The mean mitral valve gradient is 2.0 mmHg. Tricuspid Valve: The tricuspid valve is normal in structure. Tricuspid valve regurgitation is mild . No evidence of tricuspid stenosis. Aortic Valve: Mild perivalvular leak. The aortic valve has been repaired/replaced. Aortic valve regurgitation is mild. No aortic stenosis is present. Aortic valve mean gradient measures 8.0 mmHg. Aortic valve peak gradient measures 16.5 mmHg. Aortic valve area, by VTI measures 1.87 cm. There is a 29 mm Sapien prosthetic, stented (TAVR) valve present in the aortic position. Procedure Date: 11/15/2022. Pulmonic Valve: The pulmonic valve was normal in structure. Pulmonic valve regurgitation is mild to moderate. No evidence of pulmonic stenosis. Aorta: Aortic dilatation noted. There is mild dilatation of the ascending aorta, measuring 42 mm. Venous: The inferior vena cava is normal in size with greater than 50% respiratory variability, suggesting right atrial pressure of 3 mmHg. IAS/Shunts: No atrial level shunt detected by color flow Doppler. Agitated saline contrast was given intravenously to evaluate for intracardiac shunting. Agitated saline contrast bubble study was negative, with no evidence of any interatrial shunt. Additional Comments: A device lead is visualized in the right ventricle.  LEFT VENTRICLE PLAX 2D LVIDd:         5.70 cm   Diastology LVIDs:          3.90 cm   LV e' medial: 6.98 cm/s LV PW:         1.20 cm LV IVS:        1.20 cm LVOT diam:     2.20 cm LV SV:         70 LV SV Index:   36 LVOT Area:     3.80 cm  RIGHT VENTRICLE             IVC RV Basal diam:  4.30 cm     IVC diam: 2.30 cm RV Mid diam:    3.40 cm RV S prime:     11.60 cm/s TAPSE (M-mode): 1.5 cm LEFT ATRIUM              Index        RIGHT ATRIUM           Index LA diam:        4.10 cm  2.11 cm/m   RA Area:     21.90 cm LA Vol (A2C):   121.0 ml 62.15 ml/m  RA Volume:   60.20 ml  30.92 ml/m LA Vol (A4C):   62.2 ml  31.95 ml/m LA Biplane Vol: 88.3 ml  45.36 ml/m  AORTIC VALVE AV Area (Vmax):    1.74 cm AV Area (Vmean):   1.89 cm AV Area (VTI):     1.87 cm AV Vmax:           203.25 cm/s AV Vmean:          130.500 cm/s AV VTI:            0.372 m AV Peak Grad:      16.5 mmHg AV Mean Grad:      8.0 mmHg LVOT Vmax:         93.00 cm/s LVOT Vmean:  64.950 cm/s LVOT VTI:          0.183 m LVOT/AV VTI ratio: 0.49  AORTA Ao Root diam: 3.90 cm Ao Asc diam:  4.20 cm MITRAL VALVE             TRICUSPID VALVE MV Area VTI:  2.45 cm   TR Peak grad:   29.4 mmHg MV Peak grad: 5.0 mmHg   TR Vmax:        271.00 cm/s MV Mean grad: 2.0 mmHg MV Vmax:      1.12 m/s   SHUNTS MV Vmean:     59.6 cm/s  Systemic VTI:  0.18 m                          Systemic Diam: 2.20 cm Oneil Parchment MD Electronically signed by Oneil Parchment MD Signature Date/Time: 04/03/2024/3:34:13 PM    Final    CT ANGIO HEAD NECK W WO CM Result Date: 04/02/2024 EXAM: CTA HEAD AND NECK WITH AND WITHOUT 04/02/2024 09:07:20 PM TECHNIQUE: CTA of the head and neck was performed with and without the administration of intravenous contrast. Multiplanar 2D and/or 3D reformatted images are provided for review. Automated exposure control, iterative reconstruction, and/or weight based adjustment of the mA/kV was utilized to reduce the radiation dose to as low as reasonably achievable. Stenosis of the internal carotid arteries measured using NASCET  criteria. COMPARISON: CTA head/neck from 01/09/2023 CLINICAL HISTORY: Stroke/TIA, determine embolic source FINDINGS: AORTIC ARCH AND ARCH VESSELS: No dissection or arterial injury. No significant stenosis of the brachiocephalic or subclavian arteries. CERVICAL CAROTID ARTERIES: No dissection, arterial injury, or hemodynamically significant stenosis by NASCET criteria. CERVICAL VERTEBRAL ARTERIES: No dissection, arterial injury, or significant stenosis. LUNGS AND MEDIASTINUM: Unremarkable. SOFT TISSUES: No acute abnormality. BONES: No acute abnormality. ANTERIOR CIRCULATION: No significant stenosis of the internal carotid arteries. No significant stenosis of the anterior cerebral arteries. No significant stenosis of the middle cerebral arteries. No aneurysm. POSTERIOR CIRCULATION: No significant stenosis of the posterior cerebral arteries. No significant stenosis of the basilar artery. No significant stenosis of the vertebral arteries. No aneurysm. OTHER: No dural venous sinus thrombosis on this non-dedicated study. IMPRESSION: 1. No large vessel occlusion or hemodynamically significant stenosis. Electronically signed by: Gilmore Molt MD 04/02/2024 10:27 PM EST RP Workstation: [id number]   CT Knee Left Wo Contrast Result Date: 04/02/2024 CLINICAL DATA:  Fall.  Pain. EXAM: CT OF THE LEFT KNEE WITHOUT CONTRAST TECHNIQUE: Multidetector CT imaging of the left knee was performed according to the standard protocol. Multiplanar CT image reconstructions were also generated. RADIATION DOSE REDUCTION: This exam was performed according to the departmental dose-optimization program which includes automated exposure control, adjustment of the mA and/or kV according to patient size and/or use of iterative reconstruction technique. COMPARISON:  Left knee radiograph dated 01/02/2024. FINDINGS: Bones/Joint/Cartilage No acute fracture or dislocation. Moderate-to-large joint effusion. No evidence of lipohemarthrosis.  Chondrocalcinosis in the medial and lateral femorotibial compartments. Tricompartmental osteoarthritis with mild joint space narrowing and osteophytosis. Ligaments Ligaments are suboptimally evaluated by CT. Muscles and Tendons A Baker's cyst is noted. Quadriceps and patellar tendons appear intact. Soft tissue No loculated fluid collection. Peripheral vascular calcifications are noted. IMPRESSION: 1. No acute fracture or dislocation. 2. Moderate-to-large knee joint effusion. No evidence of lipohemarthrosis. 3. Tricompartmental osteoarthritis chondrocalcinosis in the medial and lateral compartments. Electronically Signed   By: Harrietta Sherry M.D.   On: 04/02/2024 13:39   MR BRAIN WO CONTRAST Result Date: 04/02/2024  CLINICAL DATA:  Syncope/presyncope EXAM: MRI HEAD WITHOUT CONTRAST TECHNIQUE: Multiplanar, multiecho pulse sequences of the brain and surrounding structures were obtained without intravenous contrast. COMPARISON:  August 02, 2023 FINDINGS: MRI brain: There is a punctate focus of restricted diffusion in the left frontal white matter and there are 2 punctate foci of restricted diffusion in the right frontal white matter. There are extensive and confluent foci of T2 hyperintensity in the cerebral white matter. These do not have restricted diffusion. The ventricles are normal. No mass lesion. There are normal flow signals in the carotid arteries and basilar artery. No significant bone marrow signal abnormality. No significant abnormality in the paranasal sinuses or soft tissues. IMPRESSION: 1. Punctate acute lacunar infarct in the left frontal white matter and 2 punctate acute lacunar infarcts in the right frontal white matter 2. Extensive chronic white matter disease Electronically Signed   By: Nancyann Burns M.D.   On: 04/02/2024 13:15     LOS: 0 days   Donalda Applebaum, MD  [facility]    To contact the attending provider between 7A-7P or the covering provider during after hours 7P-7A,  please log into the web site [email or web address] and access using universal [place] password for that web site. If you do not have the password, please call the hospital operator.  04/04/2024, 11:06 AM

## 2024-04-05 ENCOUNTER — Other Ambulatory Visit: Payer: Self-pay | Admitting: Orthopedic Surgery

## 2024-04-05 DIAGNOSIS — I10 Essential (primary) hypertension: Secondary | ICD-10-CM | POA: Diagnosis not present

## 2024-04-05 DIAGNOSIS — G43909 Migraine, unspecified, not intractable, without status migrainosus: Secondary | ICD-10-CM

## 2024-04-05 DIAGNOSIS — M25562 Pain in left knee: Secondary | ICD-10-CM

## 2024-04-05 DIAGNOSIS — K219 Gastro-esophageal reflux disease without esophagitis: Secondary | ICD-10-CM | POA: Diagnosis not present

## 2024-04-05 DIAGNOSIS — I639 Cerebral infarction, unspecified: Secondary | ICD-10-CM | POA: Diagnosis not present

## 2024-04-05 DIAGNOSIS — I25810 Atherosclerosis of coronary artery bypass graft(s) without angina pectoris: Secondary | ICD-10-CM | POA: Diagnosis not present

## 2024-04-05 LAB — CBC
HCT: 36.8 % — ABNORMAL LOW (ref 39.0–52.0)
Hemoglobin: 12.6 g/dL — ABNORMAL LOW (ref 13.0–17.0)
MCH: 33.9 pg (ref 26.0–34.0)
MCHC: 34.2 g/dL (ref 30.0–36.0)
MCV: 98.9 fL (ref 80.0–100.0)
Platelets: 146 K/uL — ABNORMAL LOW (ref 150–400)
RBC: 3.72 MIL/uL — ABNORMAL LOW (ref 4.22–5.81)
RDW: 12.7 % (ref 11.5–15.5)
WBC: 8.6 K/uL (ref 4.0–10.5)
nRBC: 0 % (ref 0.0–0.2)

## 2024-04-05 MED ORDER — ONDANSETRON HCL 4 MG PO TABS: 4.0000 mg | ORAL_TABLET | Freq: Three times a day (TID) | ORAL | Status: DC | PRN

## 2024-04-05 MED ORDER — OXYCODONE HCL 5 MG PO TABS: 5.0000 mg | ORAL_TABLET | Freq: Four times a day (QID) | ORAL | 0 refills | Status: DC | PRN

## 2024-04-05 MED ORDER — HYDROMORPHONE HCL 1 MG/ML IJ SOLN: 0.5000 mg | INTRAMUSCULAR | Status: DC | PRN

## 2024-04-05 MED ORDER — POLYETHYLENE GLYCOL 3350 17 G PO PACK: 17.0000 g | PACK | Freq: Every day | ORAL | 0 refills | Status: DC

## 2024-04-05 MED ORDER — HYDROMORPHONE HCL 1 MG/ML IJ SOLN
0.5000 mg | INTRAMUSCULAR | Status: DC | PRN
  Administered 2024-04-05: 1 mg via INTRAVENOUS
  Filled 2024-04-05: qty 1

## 2024-04-05 MED ORDER — SENNOSIDES-DOCUSATE SODIUM 8.6-50 MG PO TABS: 1.0000 | ORAL_TABLET | Freq: Every evening | ORAL | Status: DC | PRN

## 2024-04-05 MED ORDER — BUTALBITAL-APAP-CAFFEINE 50-325-40 MG PO TABS: 1.0000 | ORAL_TABLET | Freq: Three times a day (TID) | ORAL | 0 refills | Status: AC | PRN

## 2024-04-05 NOTE — Progress Notes (Signed)
 Physical Therapy Treatment Patient Details Name: [name] [name] MRN: [id number] DOB: 1935/03/07 Today's Date: 04/05/2024   History of Present Illness 89 year old presented to [facility] on  04/02/24 for dizziness and fall. MRI with acute punctate infarcts in bilateral frontal lobes. PMH - afib, htn, cabg, TAVR, CVA, bladder CA.    PT Comments  Pt received in supine and agreeable to session. Pt limited by L knee pain, but reports improvement with PROM. Pt able to stand from significantly elevated surface this session and is agreeable to transfer to the recliner via stedy. Pt able to tolerate limited weight shifting in stedy with decreased LLE WB tolerance and inability to fully extend L knee. Pt unable to complete LLE LAQ, but demonstrates improved ROM with PROM. Pt continues to benefit from PT services to progress toward functional mobility goals.     If plan is discharge home, recommend the following: A lot of help with walking and/or transfers;A little help with bathing/dressing/bathroom;Assist for transportation;Help with stairs or ramp for entrance;Assistance with cooking/housework   Can travel by private vehicle        Equipment Recommendations  None recommended by PT    Recommendations for Other Services       Precautions / Restrictions Precautions Precautions: Fall Recall of Precautions/Restrictions: Intact Restrictions Weight Bearing Restrictions Per Provider Order: No     Mobility  Bed Mobility Overal bed mobility: Needs Assistance Bed Mobility: Supine to Sit     Supine to sit: Min assist     General bed mobility comments: Assist for LLE management and trunk elevation    Transfers Overall transfer level: Needs assistance Equipment used: Ambulation equipment used Transfers: Sit to/from Stand, Bed to chair/wheelchair/BSC Sit to Stand: Max assist, Mod assist, From elevated surface           General transfer comment: STS from elevated EOB with unsuccessful first  attempt due to pain, but able to stand when EOB elevated more with increased time for rise. Pt unable to fully extend L knee Transfer via Lift Equipment: Stedy  Ambulation/Gait                   Stairs             Wheelchair Mobility     Tilt Bed    Modified Rankin (Stroke Patients Only) Modified Rankin (Stroke Patients Only) Pre-Morbid Rankin Score: No significant disability Modified Rankin: Severe disability     Balance Overall balance assessment: Needs assistance Sitting-balance support: Bilateral upper extremity supported Sitting balance-Leahy Scale: Fair Sitting balance - Comments: static sitting EOB   Standing balance support: Bilateral upper extremity supported, Reliant on assistive device for balance Standing balance-Leahy Scale: Poor Standing balance comment: reliant on stedy support, but CGA once upright                            Communication Communication Communication: Impaired Factors Affecting Communication: Hearing impaired  Cognition Arousal: Alert Behavior During Therapy: WFL for tasks assessed/performed   PT - Cognitive impairments: Other, Attention, Memory                       PT - Cognition Comments: Pt repeating himself and loosing train of thought quickly. Pt requires cues to reorient to task Following commands: Intact Following commands impaired: Follows multi-step commands with increased time    Cueing Cueing Techniques: Verbal cues, Tactile cues, Visual cues  Exercises General Exercises -  Lower Extremity Long Arc Quad: PROM, Seated, Left, 10 reps, AROM, Right (PROM LLE)    General Comments        Pertinent Vitals/Pain Pain Assessment Pain Assessment: 0-10 Pain Score: 8  Pain Location: L knee Pain Descriptors / Indicators: Grimacing, Guarding, Sharp, Sore Pain Intervention(s): Limited activity within patient's tolerance, Monitored during session, Premedicated before session, Repositioned      PT Goals (current goals can now be found in the care plan section) Acute Rehab PT Goals Patient Stated Goal: Decrease pain PT Goal Formulation: With patient Time For Goal Achievement: 04/18/24 Progress towards PT goals: Progressing toward goals    Frequency    Min 3X/week       AM-PAC PT 6 Clicks Mobility   Outcome Measure  Help needed turning from your back to your side while in a flat bed without using bedrails?: A Little Help needed moving from lying on your back to sitting on the side of a flat bed without using bedrails?: A Little Help needed moving to and from a bed to a chair (including a wheelchair)?: Total Help needed standing up from a chair using your arms (e.g., wheelchair or bedside chair)?: A Lot Help needed to walk in hospital room?: Total Help needed climbing 3-5 steps with a railing? : Total 6 Click Score: 11    End of Session Equipment Utilized During Treatment: Gait belt Activity Tolerance: Patient limited by pain Patient left: in chair;with call bell/phone within reach;with chair alarm set Nurse Communication: Mobility status PT Visit Diagnosis: Unsteadiness on feet (R26.81);Other abnormalities of gait and mobility (R26.89);Pain Pain - Right/Left: Left Pain - part of body: Knee     Time: 9150-9069 PT Time Calculation (min) (ACUTE ONLY): 41 min  Charges:    $Therapeutic Exercise: 8-22 mins $Therapeutic Activity: 23-37 mins PT General Charges $$ ACUTE PT VISIT: 1 Visit                    Darryle George, PTA Acute Rehabilitation Services Secure Chat Preferred  [phone]    Darryle George 04/05/2024, 10:20 AM

## 2024-04-05 NOTE — Discharge Summary (Signed)
 PATIENT DETAILS Name: LAVAN IMES Age: 89 y.o. Sex: male Date of Birth: 15-Apr-1935 MRN: [id number]. Admitting Physician: Marsa KATHEE Scurry, MD ERE:Ilwrjw, Arlyss RAMAN, MD  Admit Date: 04/02/2024 Discharge date: 04/05/2024  Recommendations for Outpatient Follow-up:  Follow up with PCP in 1-2 weeks Please obtain CMP/CBC in one week Please ensure follow up with Orthopedics and neurology  Admitted From:  Home  Disposition: Skilled nursing facility   Discharge Condition: good  CODE STATUS:   Code Status: Limited: Do not attempt resuscitation (DNR) -DNR-LIMITED -Do Not Intubate/DNI    Diet recommendation:  Diet Order             Diet - low sodium heart healthy           Diet regular Room service appropriate? Yes; Fluid consistency: Thin  Diet effective now                    Brief Summary: Patient is a 89 y.o.  male with history of A-fib on Eliquis , CAD s/p CABG, aortic stenosis s/p TAVR 2024, CHB s/p single-chamber PPM December 2024, HTN who had dizziness-and fell and landed on his left knee.  Upon further evaluation-he was found to have acute CVA and left knee hemarthrosis.    Significant events: 12/9>> admit to [facility]   Significant studies: 12/9>> x-ray hip/left pelvis: No fracture 12/9>> x-ray left knee: No fracture-tricompartment OA. 12/9>> CXR: No pneumonia 12/9>> MRI brain: Acute infarct in the left frontal white matter-2 acute infarct in the right frontal white matter. 12/9>> CT of the left knee: No fracture/dislocation, moderate to large joint effusion. 12/9>> CT angio head/neck: No LVO/hemodynamically significant stenosis 12/10>> echo: EF 45-50% 12/10>> A1c: 5.2 12/10>> LDL: 62 12/11>> MRI left knee: Small subchondral impaction type fracture involving the lateral tibial plateau    Significant microbiology data: None   Procedures: None   Consults: Neurology Orthopedics  Brief Hospital Course: Acute CVA Felt to be embolic Workup as  above Eliquis  Neurology following PT/OT/CIR team/TOC following-recommendations are for SNF.   Mechanic fall with left knee hemarthrosis and a small subchondral impaction type fracture involving the lateral tibial plateau S/p arthrocentesis by orthopedics at bedside on 12/7-frank blood obtained-suspicion is that he may have fallen and landed on his left knee when he had his CVA (on Eliquis ).  Since continues to have pain-MRI of the knee was obtained-appears to have a small subchondral fracture of the tibial plateau.  Discussed with Dr. Kendal and orthopedic team-recommendations are for supportive care and compression-WBAT to LLE as tolerated. Being discharged to SNF.  Please ensure follow-up with orthopedics   Permanent atrial fibrillation Paced rhythm Eliquis    Complete heart block-S/p single-chamber pacemaker implantation Telemetry monitoring   History of severe arctic stenosis-s/p TAVR Supportive care   CAD s/p CABG No anginal symptoms   HTN BP stable-continue HCTZ   HLD Statin   Discharge Diagnoses:  Principal Problem:   Acute CVA (cerebrovascular accident) (HCC) Active Problems:   HTN (hypertension)   RVOT ventricular tachycardia (HCC)   Hyperlipidemia   GERD (gastroesophageal reflux disease)   Dizziness   Severe aortic stenosis   CAD (coronary artery disease), autologous vein bypass graft   Permanent atrial fibrillation (HCC)   S/P CABG x 5   S/P TAVR (transcatheter aortic valve replacement)   History of CVA in adulthood   Discharge Instructions:  Activity:  WBAT LLE  Discharge Instructions     Ambulatory referral to Neurology   Complete by: As directed  Follow up with stroke clinic NP at [facility] in about 4-6 weeks. Thanks.   Diet - low sodium heart healthy   Complete by: As directed    Discharge instructions   Complete by: As directed    Follow with Primary MD  Cleatus Arlyss RAMAN, MD in 1-2 weeks  Please get a complete blood count and chemistry panel  checked by your Primary MD at your next visit, and again as instructed by your Primary MD.  Get Medicines reviewed and adjusted: Please take all your medications with you for your next visit with your Primary MD  Laboratory/radiological data: Please request your Primary MD to go over all hospital tests and procedure/radiological results at the follow up, please ask your Primary MD to get all Hospital records sent to his/her office.  In some cases, they will be blood work, cultures and biopsy results pending at the time of your discharge. Please request that your primary care M.D. follows up on these results.  Also Note the following: If you experience worsening of your admission symptoms, develop shortness of breath, life threatening emergency, suicidal or homicidal thoughts you must seek medical attention immediately by calling 911 or calling your MD immediately  if symptoms less severe.  You must read complete instructions/literature along with all the possible adverse reactions/side effects for all the Medicines you take and that have been prescribed to you. Take any new Medicines after you have completely understood and accpet all the possible adverse reactions/side effects.   Do not drive when taking Pain medications or sleeping medications (Benzodaizepines)  Do not take more than prescribed Pain, Sleep and Anxiety Medications. It is not advisable to combine anxiety,sleep and pain medications without talking with your primary care practitioner  Special Instructions: If you have smoked or chewed Tobacco  in the last 2 yrs please stop smoking, stop any regular Alcohol  and or any Recreational drug use.  Wear Seat belts while driving.  Please note: You were cared for by a hospitalist during your hospital stay. Once you are discharged, your primary care physician will handle any further medical issues. Please note that NO REFILLS for any discharge medications will be authorized once you are  discharged, as it is imperative that you return to your primary care physician (or establish a relationship with a primary care physician if you do not have one) for your post hospital discharge needs so that they can reassess your need for medications and monitor your lab values.   Increase activity slowly   Complete by: As directed       Allergies as of 04/05/2024       Reactions   Hydralazine  Hcl Other (See Comments)   Had severe headache all night.  Nightmares.  Drugged feeling.  Extreme dizziness   Ace Inhibitors Other (See Comments)   COUGH Product containing angiotensin-converting enzyme inhibitor (product)   Zocor [simvastatin] Other (See Comments)   MYALGIA   Nsaids Other (See Comments)   Would avoid given anticoagulation   Prednisone  Other (See Comments)   Prednisone  caused pt to have elevated BP.   Cephalexin Rash   Chocolate Other (See Comments)   Headaches   Doxycycline  Other (See Comments)   Nausea and abd pain   Gabapentin  Other (See Comments)   Dizziness   Hycodan [hydrocodone  Bit-homatrop Mbr] Nausea And Vomiting   Oxycodone  Nausea Only   Pt states that he cannot take prescription pain medication        Medication List  TAKE these medications    acetaminophen  500 MG tablet Commonly known as: TYLENOL  Take 500-1,000 mg by mouth every 6 (six) hours as needed (pain.).   apixaban  5 MG Tabs tablet Commonly known as: Eliquis  Take 1 tablet (5 mg total) by mouth 2 (two) times daily. Take evening dose starting 07/09/20   butalbital -acetaminophen -caffeine  50-325-40 MG tablet Commonly known as: FIORICET  Take 1 tablet by mouth 3 (three) times daily as needed for headache.   cetirizine  10 MG tablet Commonly known as: ZYRTEC  Take 1 tablet (10 mg total) by mouth daily.   hydrochlorothiazide  25 MG tablet Commonly known as: HYDRODIURIL  Take 1 tablet (25 mg total) by mouth daily.   Iron  (Ferrous Sulfate ) 325 (65 Fe) MG Tabs Take 325 mg by mouth daily.    latanoprost  0.005 % ophthalmic solution Commonly known as: XALATAN  Place 1 drop into both eyes at bedtime.   oxyCODONE  5 MG immediate release tablet Commonly known as: Oxy IR/ROXICODONE  Take 1 tablet (5 mg total) by mouth every 6 (six) hours as needed for severe pain (pain score 7-10).   polyethylene glycol 17 g packet Commonly known as: MiraLax Take 17 g by mouth daily.   potassium chloride  10 MEQ tablet Commonly known as: KLOR-CON  M Take 10 mEq by mouth in the morning.   pravastatin  80 MG tablet Commonly known as: PRAVACHOL  Take 1 tablet (80 mg total) by mouth every evening.   senna-docusate 8.6-50 MG tablet Commonly known as: Senokot-S Take 1 tablet by mouth at bedtime as needed for mild constipation or moderate constipation.   SYSTANE ULTRA OP Place 1-2 drops into both eyes once as needed (dry eyes.).        Follow-up Information     [facility]. Schedule an appointment as soon as possible for a visit in 1 month(s).   Specialty: Neurology Why: stroke clinic Contact information: [address] [place]  [address] [phone]        Kendal Franky SQUIBB, MD. Schedule an appointment as soon as possible for a visit in 2 week(s).   Specialty: Orthopedic Surgery Contact information: [address] [place] [address] [phone]         Cleatus Arlyss RAMAN, MD. Schedule an appointment as soon as possible for a visit in 1 week(s).   Specialty: Family Medicine Contact information: [address] [place] [address] [phone]                Allergies[1]   Other Procedures/Studies: MR KNEE LEFT WO CONTRAST Result Date: 04/04/2024 CLINICAL DATA:  Clemens.  Left knee pain. EXAM: MRI OF THE LEFT KNEE WITHOUT CONTRAST TECHNIQUE: Multiplanar, multisequence MR imaging of the knee was performed. No intravenous contrast was administered. COMPARISON:  Radiographs and CT scan 04/02/2024  FINDINGS: MENISCI Medial meniscus: Horizontal cleavage and free edge tears involving the posterior horn and midbody junction region. Lateral meniscus:  Intact LIGAMENTS Cruciates:  Intact Collaterals:  Intact.  Mild MCL and pes anserine bursitis. CARTILAGE Patellofemoral: Advanced degenerative chondrosis with full-thickness cartilage loss. Medial: Moderate degenerative chondrosis with cartilage thinning, fraying and fibrillation Lateral: Moderate degenerative chondrosis mainly involving the tibial articular cartilage. Joint: There is a very large joint effusion which is complicated by hematoma. There is also a long and thick ligamentum mucosa noted. Areas of synovial thickening are also noted. Popliteal Fossa: Moderate-sized leaking Baker's cyst also containing complex fluid, likely hematoma. Extensor Mechanism: The patella retinacular structures are intact and the quadriceps and patellar tendons  are intact. Bones: There is a small subchondral impaction type fracture involving the lateral tibial plateau posteriorly with surrounding marrow edema. Small defects in the subchondral plate. No femur or fibular fracture. No patellar fracture. Other: Unremarkable knee musculature. IMPRESSION: 1. Small subchondral impaction type fracture involving the lateral tibial plateau posteriorly with surrounding marrow edema. 2. Horizontal cleavage and free edge tears involving the posterior horn and midbody junction region of the medial meniscus. 3. Intact ligamentous structures. 4. Very large joint effusion which is complicated by hematoma. There is also a long and thick ligamentum mucosa noted. Areas of synovial thickening are also noted. 5. Moderate-sized leaking Baker's cyst also containing complex fluid, likely hematoma. 6. Tricompartmental degenerative changes most significant in the patellofemoral joint. Electronically Signed   By: MYRTIS Stammer M.D.   On: 04/04/2024 21:07   ECHOCARDIOGRAM COMPLETE BUBBLE STUDY Result Date:  04/03/2024    ECHOCARDIOGRAM REPORT   Patient Name:   ELIOTT AMPARAN Date of Exam: 04/03/2024 Medical Rec #:  [id number]           Height:       70.0 in Accession #:    [id number]          Weight:       169.8 lb Date of Birth:  1935-03-31           BSA:          1.947 m Patient Age:    89 years            BP:           150/74 mmHg Patient Gender: M                   HR:           58 bpm. Exam Location:  Inpatient Procedure: 2D Echo, Cardiac Doppler, Color Doppler and Saline Contrast Bubble            Study (Both Spectral and Color Flow Doppler were utilized during            procedure). Indications:    Stroke  History:        Patient has prior history of Echocardiogram examinations, most                 recent 10/26/2023. CAD, Arrythmias:Atrial Fibrillation; Risk                 Factors:Hypertension, Dyslipidemia and Former Smoker.                 Aortic Valve: 29 mm Sapien prosthetic, stented (TAVR) valve is                 present in the aortic position. Procedure Date: 11/15/2022.  Sonographer:    Merlynn Argyle Referring Phys: [id number] MARSA NOVAK MELVIN  Sonographer Comments: Image acquisition challenging due to respiratory motion. IMPRESSIONS  1. Left ventricular ejection fraction, by estimation, is 45 to 50%. The left ventricle has mildly decreased function. The left ventricle demonstrates regional wall motion abnormalities (see scoring diagram/findings for description). The left ventricular  internal cavity size was mildly dilated. There is mild concentric left ventricular hypertrophy. Left ventricular diastolic parameters are indeterminate.  2. Right ventricular systolic function is normal. The right ventricular size is mildly enlarged. There is mildly elevated pulmonary artery systolic pressure. The estimated right ventricular systolic pressure is 37.4 mmHg.  3. Left atrial size was severely dilated.  4. The mitral valve is degenerative. Mild mitral  valve regurgitation. No evidence of mitral stenosis. Severe  mitral annular calcification.  5. Mild perivalvular leak. The aortic valve has been repaired/replaced. Aortic valve regurgitation is mild. No aortic stenosis is present. There is a 29 mm Sapien prosthetic (TAVR) valve present in the aortic position. Procedure Date: 11/15/2022. Echo findings are consistent with perivalvular leak of the aortic prosthesis.  6. Aortic dilatation noted. There is mild dilatation of the ascending aorta, measuring 42 mm.  7. The inferior vena cava is normal in size with greater than 50% respiratory variability, suggesting right atrial pressure of 3 mmHg.  8. Agitated saline contrast bubble study was negative, with no evidence of any interatrial shunt. Conclusion(s)/Recommendation(s): No intracardiac source of embolism detected on this transthoracic study. Consider a transesophageal echocardiogram to exclude cardiac source of embolism if clinically indicated. FINDINGS  Left Ventricle: Left ventricular ejection fraction, by estimation, is 45 to 50%. The left ventricle has mildly decreased function. The left ventricle demonstrates regional wall motion abnormalities. The left ventricular internal cavity size was mildly dilated. There is mild concentric left ventricular hypertrophy. Abnormal (paradoxical) septal motion, consistent with left bundle branch block. Left ventricular diastolic parameters are indeterminate.  LV Wall Scoring: The mid inferoseptal segment is hypokinetic. Right Ventricle: The right ventricular size is mildly enlarged. No increase in right ventricular wall thickness. Right ventricular systolic function is normal. There is mildly elevated pulmonary artery systolic pressure. The tricuspid regurgitant velocity is 2.71 m/s, and with an assumed right atrial pressure of 8 mmHg, the estimated right ventricular systolic pressure is 37.4 mmHg. Left Atrium: Left atrial size was severely dilated. Right Atrium: Right atrial size was normal in size. Pericardium: There is no evidence of  pericardial effusion. Mitral Valve: The mitral valve is degenerative in appearance. Severe mitral annular calcification. Mild mitral valve regurgitation. No evidence of mitral valve stenosis. MV peak gradient, 5.0 mmHg. The mean mitral valve gradient is 2.0 mmHg. Tricuspid Valve: The tricuspid valve is normal in structure. Tricuspid valve regurgitation is mild . No evidence of tricuspid stenosis. Aortic Valve: Mild perivalvular leak. The aortic valve has been repaired/replaced. Aortic valve regurgitation is mild. No aortic stenosis is present. Aortic valve mean gradient measures 8.0 mmHg. Aortic valve peak gradient measures 16.5 mmHg. Aortic valve area, by VTI measures 1.87 cm. There is a 29 mm Sapien prosthetic, stented (TAVR) valve present in the aortic position. Procedure Date: 11/15/2022. Pulmonic Valve: The pulmonic valve was normal in structure. Pulmonic valve regurgitation is mild to moderate. No evidence of pulmonic stenosis. Aorta: Aortic dilatation noted. There is mild dilatation of the ascending aorta, measuring 42 mm. Venous: The inferior vena cava is normal in size with greater than 50% respiratory variability, suggesting right atrial pressure of 3 mmHg. IAS/Shunts: No atrial level shunt detected by color flow Doppler. Agitated saline contrast was given intravenously to evaluate for intracardiac shunting. Agitated saline contrast bubble study was negative, with no evidence of any interatrial shunt. Additional Comments: A device lead is visualized in the right ventricle.  LEFT VENTRICLE PLAX 2D LVIDd:         5.70 cm   Diastology LVIDs:         3.90 cm   LV e' medial: 6.98 cm/s LV PW:         1.20 cm LV IVS:        1.20 cm LVOT diam:     2.20 cm LV SV:         70 LV SV Index:   36 LVOT  Area:     3.80 cm  RIGHT VENTRICLE             IVC RV Basal diam:  4.30 cm     IVC diam: 2.30 cm RV Mid diam:    3.40 cm RV S prime:     11.60 cm/s TAPSE (M-mode): 1.5 cm LEFT ATRIUM              Index        RIGHT ATRIUM            Index LA diam:        4.10 cm  2.11 cm/m   RA Area:     21.90 cm LA Vol (A2C):   121.0 ml 62.15 ml/m  RA Volume:   60.20 ml  30.92 ml/m LA Vol (A4C):   62.2 ml  31.95 ml/m LA Biplane Vol: 88.3 ml  45.36 ml/m  AORTIC VALVE AV Area (Vmax):    1.74 cm AV Area (Vmean):   1.89 cm AV Area (VTI):     1.87 cm AV Vmax:           203.25 cm/s AV Vmean:          130.500 cm/s AV VTI:            0.372 m AV Peak Grad:      16.5 mmHg AV Mean Grad:      8.0 mmHg LVOT Vmax:         93.00 cm/s LVOT Vmean:        64.950 cm/s LVOT VTI:          0.183 m LVOT/AV VTI ratio: 0.49  AORTA Ao Root diam: 3.90 cm Ao Asc diam:  4.20 cm MITRAL VALVE             TRICUSPID VALVE MV Area VTI:  2.45 cm   TR Peak grad:   29.4 mmHg MV Peak grad: 5.0 mmHg   TR Vmax:        271.00 cm/s MV Mean grad: 2.0 mmHg MV Vmax:      1.12 m/s   SHUNTS MV Vmean:     59.6 cm/s  Systemic VTI:  0.18 m                          Systemic Diam: 2.20 cm Oneil Parchment MD Electronically signed by Oneil Parchment MD Signature Date/Time: 04/03/2024/3:34:13 PM    Final    CT ANGIO HEAD NECK W WO CM Result Date: 04/02/2024 EXAM: CTA HEAD AND NECK WITH AND WITHOUT 04/02/2024 09:07:20 PM TECHNIQUE: CTA of the head and neck was performed with and without the administration of intravenous contrast. Multiplanar 2D and/or 3D reformatted images are provided for review. Automated exposure control, iterative reconstruction, and/or weight based adjustment of the mA/kV was utilized to reduce the radiation dose to as low as reasonably achievable. Stenosis of the internal carotid arteries measured using NASCET criteria. COMPARISON: CTA head/neck from 01/09/2023 CLINICAL HISTORY: Stroke/TIA, determine embolic source FINDINGS: AORTIC ARCH AND ARCH VESSELS: No dissection or arterial injury. No significant stenosis of the brachiocephalic or subclavian arteries. CERVICAL CAROTID ARTERIES: No dissection, arterial injury, or hemodynamically significant stenosis by NASCET criteria.  CERVICAL VERTEBRAL ARTERIES: No dissection, arterial injury, or significant stenosis. LUNGS AND MEDIASTINUM: Unremarkable. SOFT TISSUES: No acute abnormality. BONES: No acute abnormality. ANTERIOR CIRCULATION: No significant stenosis of the internal carotid arteries. No significant stenosis of the anterior cerebral arteries. No significant stenosis  of the middle cerebral arteries. No aneurysm. POSTERIOR CIRCULATION: No significant stenosis of the posterior cerebral arteries. No significant stenosis of the basilar artery. No significant stenosis of the vertebral arteries. No aneurysm. OTHER: No dural venous sinus thrombosis on this non-dedicated study. IMPRESSION: 1. No large vessel occlusion or hemodynamically significant stenosis. Electronically signed by: Gilmore Molt MD 04/02/2024 10:27 PM EST RP Workstation: [id number]   CT Knee Left Wo Contrast Result Date: 04/02/2024 CLINICAL DATA:  Fall.  Pain. EXAM: CT OF THE LEFT KNEE WITHOUT CONTRAST TECHNIQUE: Multidetector CT imaging of the left knee was performed according to the standard protocol. Multiplanar CT image reconstructions were also generated. RADIATION DOSE REDUCTION: This exam was performed according to the departmental dose-optimization program which includes automated exposure control, adjustment of the mA and/or kV according to patient size and/or use of iterative reconstruction technique. COMPARISON:  Left knee radiograph dated 01/02/2024. FINDINGS: Bones/Joint/Cartilage No acute fracture or dislocation. Moderate-to-large joint effusion. No evidence of lipohemarthrosis. Chondrocalcinosis in the medial and lateral femorotibial compartments. Tricompartmental osteoarthritis with mild joint space narrowing and osteophytosis. Ligaments Ligaments are suboptimally evaluated by CT. Muscles and Tendons A Baker's cyst is noted. Quadriceps and patellar tendons appear intact. Soft tissue No loculated fluid collection. Peripheral vascular calcifications are  noted. IMPRESSION: 1. No acute fracture or dislocation. 2. Moderate-to-large knee joint effusion. No evidence of lipohemarthrosis. 3. Tricompartmental osteoarthritis chondrocalcinosis in the medial and lateral compartments. Electronically Signed   By: Harrietta Sherry M.D.   On: 04/02/2024 13:39   MR BRAIN WO CONTRAST Result Date: 04/02/2024 CLINICAL DATA:  Syncope/presyncope EXAM: MRI HEAD WITHOUT CONTRAST TECHNIQUE: Multiplanar, multiecho pulse sequences of the brain and surrounding structures were obtained without intravenous contrast. COMPARISON:  August 02, 2023 FINDINGS: MRI brain: There is a punctate focus of restricted diffusion in the left frontal white matter and there are 2 punctate foci of restricted diffusion in the right frontal white matter. There are extensive and confluent foci of T2 hyperintensity in the cerebral white matter. These do not have restricted diffusion. The ventricles are normal. No mass lesion. There are normal flow signals in the carotid arteries and basilar artery. No significant bone marrow signal abnormality. No significant abnormality in the paranasal sinuses or soft tissues. IMPRESSION: 1. Punctate acute lacunar infarct in the left frontal white matter and 2 punctate acute lacunar infarcts in the right frontal white matter 2. Extensive chronic white matter disease Electronically Signed   By: Nancyann Burns M.D.   On: 04/02/2024 13:15   DG Chest 2 View Result Date: 04/02/2024 EXAM: 2 VIEW(S) XRAY OF THE CHEST 04/02/2024 08:03:00 AM COMPARISON: 04/03/2023 CLINICAL HISTORY: dizziness FINDINGS: LUNGS AND PLEURA: No focal pulmonary opacity. No pleural effusion. No pneumothorax. HEART AND MEDIASTINUM: Mild cardiomegaly. Aortic valve prosthesis noted. CABG noted. Left chest single lead pacemaker noted. Aortic arch calcifications. BONES AND SOFT TISSUES: Sternotomy wires noted. Thoracic spondylosis. No acute osseous abnormality. IMPRESSION: 1. No acute findings. 2. Mild cardiomegaly  with aortic valve prosthesis, CABG, and left chest single lead pacemaker. Electronically signed by: Evalene Coho MD 04/02/2024 08:30 AM EST RP Workstation: [id number]   DG Hip Unilat W or Wo Pelvis 2-3 Views Left Result Date: 04/02/2024 CLINICAL DATA:  Status post fall EXAM: DG HIP (WITH OR WITHOUT PELVIS) 3V LEFT COMPARISON:  None Available. FINDINGS: There is no evidence of hip fracture or dislocation. Mild degenerative changes of the hips. IMPRESSION: No acute fracture or dislocation. Mild degenerative changes of the hips. Electronically Signed   By: Limin  Xu  M.D.   On: 04/02/2024 08:29   DG Knee Complete 4 Views Left Result Date: 04/02/2024 EXAM: 4 OR MORE VIEW(S) XRAY OF THE LEFT KNEE 04/02/2024 08:03:00 AM COMPARISON: None available. CLINICAL HISTORY: fall FINDINGS: BONES AND JOINTS: No acute fracture. No malalignment. There is tricompartmental osteoarthritis. There are calcifications present within the medial meniscus. There is a moderate suprapatellar bursal effusion. SOFT TISSUES: There are moderate atheromatous calcifications present. IMPRESSION: 1. 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 2. Moderate suprapatellar bursal effusion. Electronically signed by: Evalene Coho MD 04/02/2024 08:29 AM EST RP Workstation: [id number]   CT HEAD WO CONTRAST ( ) Result Date: 04/02/2024 EXAM: CT HEAD WITHOUT CONTRAST 04/02/2024 07:29:16 AM TECHNIQUE: CT of the head was performed without the administration of intravenous contrast. Automated exposure control, iterative reconstruction, and/or weight based adjustment of the mA/kV was utilized to reduce the radiation dose to as low as reasonably achievable. COMPARISON: CT of the head dated 01/09/2023. CLINICAL HISTORY: Syncope/presyncope, cerebrovascular cause suspected. FINDINGS: BRAIN AND VENTRICLES: No acute hemorrhage. No evidence of acute infarct. No hydrocephalus. No extra-axial collection. No mass effect or midline  shift. There is age-related atrophy and moderate diffuse cerebral white matter disease. There are senescent calcifications within the basal ganglia bilaterally. There are atheromatous calcifications within the carotid siphons and vertebral arteries. ORBITS: No acute abnormality. The patient is status post bilateral lens replacement. SINUSES: No acute abnormality. SOFT TISSUES AND SKULL: No acute soft tissue abnormality. No skull fracture. IMPRESSION: 1. No acute intracranial abnormality. 2. Age-related atrophy and moderate diffuse cerebral white matter disease. Electronically signed by: Evalene Coho MD 04/02/2024 07:32 AM EST RP Workstation: [id number]   CUP PACEART REMOTE DEVICE CHECK Result Date: 04/01/2024 PPM Scheduled remote reviewed. Normal device function.  Presenting rhythm: VP, PVCs. Next remote transmission per protocol. - CS, CVRS    TODAY-DAY OF DISCHARGE:  Subjective:   Emeril Stille today has no headache,no chest abdominal pain,no new weakness tingling or numbness, feels much better wants to go home today.   Objective:   Blood pressure 112/71, pulse 61, temperature 97.8 F (36.6 C), temperature source Oral, resp. rate 16, height 5' 10 (1.778 m), weight 77 kg, SpO2 96%.  Intake/Output Summary (Last 24 hours) at 04/05/2024 1142 Last data filed at 04/05/2024 0348 Gross per 24 hour  Intake --  Output 700 ml  Net -700 ml   Filed Weights   04/02/24 0651  Weight: 77 kg    Exam: Awake Alert, Oriented *3, No new F.N deficits, Normal affect [place].AT,PERRAL Supple Neck,No JVD, No cervical lymphadenopathy appriciated.  Symmetrical Chest wall movement, Good air movement bilaterally, CTAB RRR,No Gallops,Rubs or new Murmurs, No Parasternal Heave +ve B.Sounds, Abd Soft, Non tender, No organomegaly appriciated, No rebound -guarding or rigidity. No Cyanosis, Clubbing or edema, No new Rash or bruise   PERTINENT RADIOLOGIC STUDIES: MR KNEE LEFT WO CONTRAST Result Date:  04/04/2024 CLINICAL DATA:  [place].  Left knee pain. EXAM: MRI OF THE LEFT KNEE WITHOUT CONTRAST TECHNIQUE: Multiplanar, multisequence MR imaging of the knee was performed. No intravenous contrast was administered. COMPARISON:  Radiographs and CT scan 04/02/2024 FINDINGS: MENISCI Medial meniscus: Horizontal cleavage and free edge tears involving the posterior horn and midbody junction region. Lateral meniscus:  Intact LIGAMENTS Cruciates:  Intact Collaterals:  Intact.  Mild MCL and pes anserine bursitis. CARTILAGE Patellofemoral: Advanced degenerative chondrosis with full-thickness cartilage loss. Medial: Moderate degenerative chondrosis with cartilage thinning, fraying and fibrillation Lateral: Moderate degenerative chondrosis mainly involving the tibial articular cartilage. Joint: There is  a very large joint effusion which is complicated by hematoma. There is also a long and thick ligamentum mucosa noted. Areas of synovial thickening are also noted. Popliteal Fossa: Moderate-sized leaking Baker's cyst also containing complex fluid, likely hematoma. Extensor Mechanism: The patella retinacular structures are intact and the quadriceps and patellar tendons are intact. Bones: There is a small subchondral impaction type fracture involving the lateral tibial plateau posteriorly with surrounding marrow edema. Small defects in the subchondral plate. No femur or fibular fracture. No patellar fracture. Other: Unremarkable knee musculature. IMPRESSION: 1. Small subchondral impaction type fracture involving the lateral tibial plateau posteriorly with surrounding marrow edema. 2. Horizontal cleavage and free edge tears involving the posterior horn and midbody junction region of the medial meniscus. 3. Intact ligamentous structures. 4. Very large joint effusion which is complicated by hematoma. There is also a long and thick ligamentum mucosa noted. Areas of synovial thickening are also noted. 5. Moderate-sized leaking Baker's  cyst also containing complex fluid, likely hematoma. 6. Tricompartmental degenerative changes most significant in the patellofemoral joint. Electronically Signed   By: MYRTIS Stammer M.D.   On: 04/04/2024 21:07   ECHOCARDIOGRAM COMPLETE BUBBLE STUDY Result Date: 04/03/2024    ECHOCARDIOGRAM REPORT   Patient Name:   LARY ECKARDT Date of Exam: 04/03/2024 Medical Rec #:  [id number]           Height:       70.0 in Accession #:    [id number]          Weight:       169.8 lb Date of Birth:  1934-06-25           BSA:          1.947 m Patient Age:    89 years            BP:           150/74 mmHg Patient Gender: M                   HR:           58 bpm. Exam Location:  Inpatient Procedure: 2D Echo, Cardiac Doppler, Color Doppler and Saline Contrast Bubble            Study (Both Spectral and Color Flow Doppler were utilized during            procedure). Indications:    Stroke  History:        Patient has prior history of Echocardiogram examinations, most                 recent 10/26/2023. CAD, Arrythmias:Atrial Fibrillation; Risk                 Factors:Hypertension, Dyslipidemia and Former Smoker.                 Aortic Valve: 29 mm Sapien prosthetic, stented (TAVR) valve is                 present in the aortic position. Procedure Date: 11/15/2022.  Sonographer:    Merlynn Argyle Referring Phys: [id number] MARSA NOVAK MELVIN  Sonographer Comments: Image acquisition challenging due to respiratory motion. IMPRESSIONS  1. Left ventricular ejection fraction, by estimation, is 45 to 50%. The left ventricle has mildly decreased function. The left ventricle demonstrates regional wall motion abnormalities (see scoring diagram/findings for description). The left ventricular  internal cavity size was mildly dilated. There is mild concentric left ventricular hypertrophy. Left ventricular  diastolic parameters are indeterminate.  2. Right ventricular systolic function is normal. The right ventricular size is mildly enlarged. There is  mildly elevated pulmonary artery systolic pressure. The estimated right ventricular systolic pressure is 37.4 mmHg.  3. Left atrial size was severely dilated.  4. The mitral valve is degenerative. Mild mitral valve regurgitation. No evidence of mitral stenosis. Severe mitral annular calcification.  5. Mild perivalvular leak. The aortic valve has been repaired/replaced. Aortic valve regurgitation is mild. No aortic stenosis is present. There is a 29 mm Sapien prosthetic (TAVR) valve present in the aortic position. Procedure Date: 11/15/2022. Echo findings are consistent with perivalvular leak of the aortic prosthesis.  6. Aortic dilatation noted. There is mild dilatation of the ascending aorta, measuring 42 mm.  7. The inferior vena cava is normal in size with greater than 50% respiratory variability, suggesting right atrial pressure of 3 mmHg.  8. Agitated saline contrast bubble study was negative, with no evidence of any interatrial shunt. Conclusion(s)/Recommendation(s): No intracardiac source of embolism detected on this transthoracic study. Consider a transesophageal echocardiogram to exclude cardiac source of embolism if clinically indicated. FINDINGS  Left Ventricle: Left ventricular ejection fraction, by estimation, is 45 to 50%. The left ventricle has mildly decreased function. The left ventricle demonstrates regional wall motion abnormalities. The left ventricular internal cavity size was mildly dilated. There is mild concentric left ventricular hypertrophy. Abnormal (paradoxical) septal motion, consistent with left bundle branch block. Left ventricular diastolic parameters are indeterminate.  LV Wall Scoring: The mid inferoseptal segment is hypokinetic. Right Ventricle: The right ventricular size is mildly enlarged. No increase in right ventricular wall thickness. Right ventricular systolic function is normal. There is mildly elevated pulmonary artery systolic pressure. The tricuspid regurgitant velocity is  2.71 m/s, and with an assumed right atrial pressure of 8 mmHg, the estimated right ventricular systolic pressure is 37.4 mmHg. Left Atrium: Left atrial size was severely dilated. Right Atrium: Right atrial size was normal in size. Pericardium: There is no evidence of pericardial effusion. Mitral Valve: The mitral valve is degenerative in appearance. Severe mitral annular calcification. Mild mitral valve regurgitation. No evidence of mitral valve stenosis. MV peak gradient, 5.0 mmHg. The mean mitral valve gradient is 2.0 mmHg. Tricuspid Valve: The tricuspid valve is normal in structure. Tricuspid valve regurgitation is mild . No evidence of tricuspid stenosis. Aortic Valve: Mild perivalvular leak. The aortic valve has been repaired/replaced. Aortic valve regurgitation is mild. No aortic stenosis is present. Aortic valve mean gradient measures 8.0 mmHg. Aortic valve peak gradient measures 16.5 mmHg. Aortic valve area, by VTI measures 1.87 cm. There is a 29 mm Sapien prosthetic, stented (TAVR) valve present in the aortic position. Procedure Date: 11/15/2022. Pulmonic Valve: The pulmonic valve was normal in structure. Pulmonic valve regurgitation is mild to moderate. No evidence of pulmonic stenosis. Aorta: Aortic dilatation noted. There is mild dilatation of the ascending aorta, measuring 42 mm. Venous: The inferior vena cava is normal in size with greater than 50% respiratory variability, suggesting right atrial pressure of 3 mmHg. IAS/Shunts: No atrial level shunt detected by color flow Doppler. Agitated saline contrast was given intravenously to evaluate for intracardiac shunting. Agitated saline contrast bubble study was negative, with no evidence of any interatrial shunt. Additional Comments: A device lead is visualized in the right ventricle.  LEFT VENTRICLE PLAX 2D LVIDd:         5.70 cm   Diastology LVIDs:         3.90 cm   LV  e' medial: 6.98 cm/s LV PW:         1.20 cm LV IVS:        1.20 cm LVOT diam:      2.20 cm LV SV:         70 LV SV Index:   36 LVOT Area:     3.80 cm  RIGHT VENTRICLE             IVC RV Basal diam:  4.30 cm     IVC diam: 2.30 cm RV Mid diam:    3.40 cm RV S prime:     11.60 cm/s TAPSE (M-mode): 1.5 cm LEFT ATRIUM              Index        RIGHT ATRIUM           Index LA diam:        4.10 cm  2.11 cm/m   RA Area:     21.90 cm LA Vol (A2C):   121.0 ml 62.15 ml/m  RA Volume:   60.20 ml  30.92 ml/m LA Vol (A4C):   62.2 ml  31.95 ml/m LA Biplane Vol: 88.3 ml  45.36 ml/m  AORTIC VALVE AV Area (Vmax):    1.74 cm AV Area (Vmean):   1.89 cm AV Area (VTI):     1.87 cm AV Vmax:           203.25 cm/s AV Vmean:          130.500 cm/s AV VTI:            0.372 m AV Peak Grad:      16.5 mmHg AV Mean Grad:      8.0 mmHg LVOT Vmax:         93.00 cm/s LVOT Vmean:        64.950 cm/s LVOT VTI:          0.183 m LVOT/AV VTI ratio: 0.49  AORTA Ao Root diam: 3.90 cm Ao Asc diam:  4.20 cm MITRAL VALVE             TRICUSPID VALVE MV Area VTI:  2.45 cm   TR Peak grad:   29.4 mmHg MV Peak grad: 5.0 mmHg   TR Vmax:        271.00 cm/s MV Mean grad: 2.0 mmHg MV Vmax:      1.12 m/s   SHUNTS MV Vmean:     59.6 cm/s  Systemic VTI:  0.18 m                          Systemic Diam: 2.20 cm Oneil Parchment MD Electronically signed by Oneil Parchment MD Signature Date/Time: 04/03/2024/3:34:13 PM    Final      PERTINENT LAB RESULTS: CBC: Recent Labs    04/03/24 0114 04/05/24 0236  WBC 10.6* 8.6  HGB 14.1 12.6*  HCT 42.3 36.8*  PLT 147* 146*   CMET CMP     Component Value Date/Time   NA 138 04/03/2024 0114   NA 142 04/05/2023 1140   K 3.7 04/03/2024 0114   K 3.9 12/20/2017 0000   CL 102 04/03/2024 0114   CO2 26 04/03/2024 0114   GLUCOSE 148 (H) 04/03/2024 0114   BUN 14 04/03/2024 0114   BUN 18 04/05/2023 1140   CREATININE 1.03 04/03/2024 0114   CREATININE 1.010 12/20/2017 0000   CREATININE 0.9 11/27/2013 0000   CALCIUM  8.7 (L)  04/03/2024 0114   PROT 7.0 04/03/2024 0114   ALBUMIN 3.7 04/03/2024 0114    ALBUMIN 4.8 07/27/2021 1435   AST 40 04/03/2024 0114   AST 19 12/20/2017 0000   ALT 16 04/03/2024 0114   ALT 18 12/20/2017 0000   ALKPHOS 53 04/03/2024 0114   ALKPHOS 64 11/27/2013 0000   BILITOT 1.9 (H) 04/03/2024 0114   GFR 77.17 12/28/2023 0947   EGFR 82 04/05/2023 1140   GFRNONAA >60 04/03/2024 0114    GFR Estimated Creatinine Clearance: 52.2 mL/min (by C-G formula based on SCr of 1.03 mg/dL). No results for input(s): LIPASE, AMYLASE in the last 72 hours. No results for input(s): CKTOTAL, CKMB, CKMBINDEX, TROPONINI in the last 72 hours. Invalid input(s): POCBNP No results for input(s): DDIMER in the last 72 hours. Recent Labs    04/03/24 0114  HGBA1C 5.2   Recent Labs    04/03/24 0114  CHOL 140  HDL 70  LDLCALC 62  TRIG 41  CHOLHDL 2.0   No results for input(s): TSH, T4TOTAL, T3FREE, THYROIDAB in the last 72 hours.  Invalid input(s): FREET3 No results for input(s): VITAMINB12, FOLATE, FERRITIN, TIBC, IRON , RETICCTPCT in the last 72 hours. Coags: No results for input(s): INR in the last 72 hours.  Invalid input(s): PT Microbiology: No results found for this or any previous visit (from the past 240 hours).  FURTHER DISCHARGE INSTRUCTIONS:  Get Medicines reviewed and adjusted: Please take all your medications with you for your next visit with your Primary MD  Laboratory/radiological data: Please request your Primary MD to go over all hospital tests and procedure/radiological results at the follow up, please ask your Primary MD to get all Hospital records sent to his/her office.  In some cases, they will be blood work, cultures and biopsy results pending at the time of your discharge. Please request that your primary care M.D. goes through all the records of your hospital data and follows up on these results.  Also Note the following: If you experience worsening of your admission symptoms, develop shortness of breath,  life threatening emergency, suicidal or homicidal thoughts you must seek medical attention immediately by calling 911 or calling your MD immediately  if symptoms less severe.  You must read complete instructions/literature along with all the possible adverse reactions/side effects for all the Medicines you take and that have been prescribed to you. Take any new Medicines after you have completely understood and accpet all the possible adverse reactions/side effects.   Do not drive when taking Pain medications or sleeping medications (Benzodaizepines)  Do not take more than prescribed Pain, Sleep and Anxiety Medications. It is not advisable to combine anxiety,sleep and pain medications without talking with your primary care practitioner  Special Instructions: If you have smoked or chewed Tobacco  in the last 2 yrs please stop smoking, stop any regular Alcohol  and or any Recreational drug use.  Wear Seat belts while driving.  Please note: You were cared for by a hospitalist during your hospital stay. Once you are discharged, your primary care physician will handle any further medical issues. Please note that NO REFILLS for any discharge medications will be authorized once you are discharged, as it is imperative that you return to your primary care physician (or establish a relationship with a primary care physician if you do not have one) for your post hospital discharge needs so that they can reassess your need for medications and monitor your lab values.  Total Time spent coordinating discharge  including counseling, education and face to face time equals greater than 30 minutes.  Signed: Donalda Applebaum 04/05/2024 11:42 AM      [1]  Allergies Allergen Reactions   Hydralazine  Hcl Other (See Comments)    Had severe headache all night.  Nightmares.  Drugged feeling.  Extreme dizziness   Ace Inhibitors Other (See Comments)    COUGH  Product containing angiotensin-converting enzyme  inhibitor (product)   Zocor [Simvastatin] Other (See Comments)    MYALGIA   Nsaids Other (See Comments)    Would avoid given anticoagulation   Prednisone  Other (See Comments)    Prednisone  caused pt to have elevated BP.   Cephalexin Rash   Chocolate Other (See Comments)    Headaches    Doxycycline  Other (See Comments)    Nausea and abd pain   Gabapentin  Other (See Comments)    Dizziness   Hycodan [Hydrocodone  Bit-Homatrop Mbr] Nausea And Vomiting   Oxycodone  Nausea Only    Pt states that he cannot take prescription pain medication

## 2024-04-05 NOTE — Plan of Care (Signed)
°  Problem: Education: Goal: Knowledge of disease or condition will improve Outcome: Completed/Met Goal: Knowledge of secondary prevention will improve (MUST DOCUMENT ALL) Outcome: Completed/Met Goal: Knowledge of patient specific risk factors will improve (DELETE if not current risk factor) Outcome: Completed/Met   Problem: Ischemic Stroke/TIA Tissue Perfusion: Goal: Complications of ischemic stroke/TIA will be minimized Outcome: Completed/Met   Problem: Coping: Goal: Will verbalize positive feelings about self Outcome: Completed/Met Goal: Will identify appropriate support needs Outcome: Completed/Met   Problem: Health Behavior/Discharge Planning: Goal: Ability to manage health-related needs will improve Outcome: Completed/Met Goal: Goals will be collaboratively established with patient/family Outcome: Completed/Met   Problem: Self-Care: Goal: Ability to participate in self-care as condition permits will improve Outcome: Completed/Met Goal: Verbalization of feelings and concerns over difficulty with self-care will improve Outcome: Completed/Met Goal: Ability to communicate needs accurately will improve Outcome: Completed/Met   Problem: Nutrition: Goal: Risk of aspiration will decrease Outcome: Completed/Met Goal: Dietary intake will improve Outcome: Completed/Met   Problem: Acute Rehab PT Goals(only PT should resolve) Goal: Pt Will Go Supine/Side To Sit Outcome: Completed/Met Goal: Pt Will Go Sit To Supine/Side Outcome: Completed/Met Goal: Patient Will Transfer Sit To/From Stand Outcome: Completed/Met Goal: Pt Will Ambulate Outcome: Completed/Met Goal: Pt Will Go Up/Down Stairs Outcome: Completed/Met   Problem: Acute Rehab OT Goals (only OT should resolve) Goal: Pt. Will Perform Lower Body Bathing Outcome: Completed/Met Goal: Pt. Will Perform Lower Body Dressing Outcome: Completed/Met Goal: Pt. Will Transfer To Toilet Outcome: Completed/Met Goal: Pt. Will  Perform Toileting-Clothing Manipulation Outcome: Completed/Met   Problem: Acute Rehab OT Goals (only OT should resolve) Goal: OT Additional ADL Goal #1 Outcome: Completed/Met   Problem: Education: Goal: Knowledge of General Education information will improve Description: Including pain rating scale, medication(s)/side effects and non-pharmacologic comfort measures Outcome: Completed/Met   Problem: Health Behavior/Discharge Planning: Goal: Ability to manage health-related needs will improve Outcome: Completed/Met   Problem: Clinical Measurements: Goal: Ability to maintain clinical measurements within normal limits will improve Outcome: Completed/Met Goal: Will remain free from infection Outcome: Completed/Met Goal: Diagnostic test results will improve Outcome: Completed/Met Goal: Respiratory complications will improve Outcome: Completed/Met Goal: Cardiovascular complication will be avoided Outcome: Completed/Met   Problem: Activity: Goal: Risk for activity intolerance will decrease Outcome: Completed/Met   Problem: Nutrition: Goal: Adequate nutrition will be maintained Outcome: Completed/Met   Problem: Coping: Goal: Level of anxiety will decrease Outcome: Completed/Met   Problem: Elimination: Goal: Will not experience complications related to bowel motility Outcome: Completed/Met Goal: Will not experience complications related to urinary retention Outcome: Completed/Met   Problem: Pain Managment: Goal: General experience of comfort will improve and/or be controlled Outcome: Completed/Met   Problem: Safety: Goal: Ability to remain free from injury will improve Outcome: Completed/Met   Problem: Skin Integrity: Goal: Risk for impaired skin integrity will decrease Outcome: Completed/Met   Problem: SLP Cognition Goals Goal: Patient will demonstrate attention to functional Description: Patient will demonstrate attention to functional task with Outcome:  Completed/Met Goal: Patient will utilize external memory aids Description: Patient will utilize external memory aids to facilitate recall of information for improved safety with Outcome: Completed/Met   Problem: SLP Language Goals Goal: Misc Speech Goal #1 Outcome: Completed/Met

## 2024-04-05 NOTE — Plan of Care (Signed)
°  Problem: SLP Cognition Goals Goal: Patient will demonstrate attention to functional Description: Patient will demonstrate attention to functional task with Flowsheets (Taken 04/05/2024 1053) Patient will demonstrate _____ attention to functional task with ____:  focused  min assist Goal: Patient will utilize external memory aids Description: Patient will utilize external memory aids to facilitate recall of information for improved safety with Flowsheets (Taken 04/05/2024 1053) Patient will utilize external memory aids to facilitate recall of ____ information for improved safety with ______:  basic  min assist   Problem: SLP Language Goals Goal: Misc Speech Goal #1 Flowsheets (Taken 04/05/2024 1053) Misc Speech goal #1: Pt will utilize compensatory word-finding strategies to reduce communication breakdowns in >80% of opportunities.

## 2024-04-05 NOTE — Progress Notes (Signed)
 Occupational Therapy Treatment Patient Details Name: [name] [name] MRN: [id number] DOB: August 04, 1934 Today's Date: 04/05/2024   History of present illness 89 year old presented to [facility] on  04/02/24 for dizziness and fall. MRI with acute punctate infarcts in bilateral frontal lobes. PMH - afib, htn, cabg, TAVR, CVA, bladder CA.   OT comments  Patient up in the recliner post PT, remains comfortable and wanting to sit up for lunch.  Light grooming completed in sitting with feet down.  Increased pain to L knee with AAROM.  MR knee:  Small subchondral impaction type fracture involving the lateral tibial plateau posteriorly with surrounding marrow edema 04/05/24.  No changes to WB status.  OT will continue efforts in the acute setting and Patient will benefit from continued inpatient follow up therapy, <3 hours/day.      If plan is discharge home, recommend the following:  Two people to help with walking and/or transfers;A lot of help with bathing/dressing/bathroom;Assist for transportation;Help with stairs or ramp for entrance   Equipment Recommendations  BSC/3in1    Recommendations for Other Services      Precautions / Restrictions Precautions Precautions: Fall Recall of Precautions/Restrictions: Intact Restrictions Weight Bearing Restrictions Per Provider Order: No Other Position/Activity Restrictions: New finding on MR Knee - Small subchondral impaction type fracture involving the lateral  tibial plateau posteriorly with surrounding marrow edema 12/12.  No new orders or WB changes       Mobility Bed Mobility               General bed mobility comments: Up in recliner, comfortable and wanting to sit up    Transfers                         Balance Overall balance assessment: Needs assistance Sitting-balance support: Bilateral upper extremity supported Sitting balance-Leahy Scale: Fair                                     ADL either performed  or assessed with clinical judgement   ADL       Grooming: Set up;Sitting;Wash/dry hands;Wash/dry face;Oral care                                      Extremity/Trunk Assessment Upper Extremity Assessment Upper Extremity Assessment: Overall WFL for tasks assessed   Lower Extremity Assessment Lower Extremity Assessment: Defer to PT evaluation   Cervical / Trunk Assessment Cervical / Trunk Assessment: Normal    Vision Baseline Vision/History: 1 Wears glasses     Perception Perception Perception: Not tested   Praxis Praxis Praxis: Not tested   Communication Communication Communication: Impaired Factors Affecting Communication: Hearing impaired   Cognition Arousal: Alert Behavior During Therapy: WFL for tasks assessed/performed Cognition: History of cognitive impairments                               Following commands: Intact Following commands impaired: Follows multi-step commands with increased time      Cueing   Cueing Techniques: Verbal cues, Tactile cues, Visual cues  Exercises      Shoulder Instructions       General Comments  VSS on RA    Pertinent Vitals/ Pain       Pain  Assessment Pain Assessment: Faces Faces Pain Scale: Hurts even more Pain Location: L knee Pain Descriptors / Indicators: Grimacing, Guarding, Sharp, Sore Pain Intervention(s): Monitored during session  Home Living     Available Help at Discharge: Family;Available PRN/intermittently Type of Home: House                              Lives With: Alone    Prior Functioning/Environment              Frequency  Min 2X/week        Progress Toward Goals  OT Goals(current goals can now be found in the care plan section)  Progress towards OT goals: Progressing toward goals  Acute Rehab OT Goals OT Goal Formulation: With patient Time For Goal Achievement: 04/17/24 Potential to Achieve Goals: Good  Plan      Co-evaluation                  AM-PAC OT 6 Clicks Daily Activity     Outcome Measure   Help from another person eating meals?: None Help from another person taking care of personal grooming?: A Little Help from another person toileting, which includes using toliet, bedpan, or urinal?: A Lot Help from another person bathing (including washing, rinsing, drying)?: A Lot Help from another person to put on and taking off regular upper body clothing?: A Little Help from another person to put on and taking off regular lower body clothing?: A Lot 6 Click Score: 16    End of Session    OT Visit Diagnosis: Unsteadiness on feet (R26.81);Other abnormalities of gait and mobility (R26.89);Muscle weakness (generalized) (M62.81);History of falling (Z91.81);Low vision, both eyes (H54.2);Pain Pain - Right/Left: Left Pain - part of body: Knee   Activity Tolerance Patient tolerated treatment well   Patient Left in chair;with call bell/phone within reach;with chair alarm set   Nurse Communication Mobility status        Time: 8874-8857 OT Time Calculation (min): 17 min  Charges: OT General Charges $OT Visit: 1 Visit OT Treatments $Self Care/Home Management : 8-22 mins  04/05/2024  RP, OTR/L  Acute Rehabilitation Services  Office:  [phone]   [name] [name] 04/05/2024, 12:41 PM

## 2024-04-05 NOTE — TOC Transition Note (Signed)
 Transition of Care [facility] - Discharge Note   Patient Details  Name: [name] [name] MRN: [id number] Date of Birth: February 14, 1935  Transition of Care [facility]) CM/SW Contact:  [name], LCSW Phone Number: 04/05/2024, 1:15 PM   Clinical Narrative:     Per MD patient ready for DC to [facility]. RN, patient, patient's family, and facility notified of DC. Discharge Summary and FL2 sent to facility. RN to call report prior to discharge [phone]). DC packet on chart. Ambulance transport requested for patient.   CSW will sign off for now as social work intervention is no longer needed. Please consult us  again if new needs arise.   Final next level of care: Skilled Nursing Facility Barriers to Discharge: No Barriers Identified        Discharge Placement              Patient [facility] bed at: [facility] Patient to be transferred to facility by: [facility] Name of family member notified: Son [name] [name] Patient and family notified of of transfer: 04/05/24  Discharge Plan and Services Additional resources added to the After Visit Summary for                                       Social Drivers of Health (SDOH) Interventions SDOH Screenings   Food Insecurity: No Food Insecurity (04/04/2024)  Housing: Unknown (04/04/2024)  Transportation Needs: No Transportation Needs (04/04/2024)  Utilities: Not At Risk (04/04/2024)  Alcohol Screen: Low Risk (02/13/2023)  Depression (PHQ2-9): Medium Risk (08/21/2023)  Financial Resource Strain: Low Risk (02/13/2023)  Physical Activity: Insufficiently Active (02/13/2023)  Social Connections: Socially Isolated (04/04/2024)  Stress: No Stress Concern Present (02/13/2023)  Tobacco Use: Medium Risk (04/04/2024)  Health Literacy: Adequate Health Literacy (02/13/2023)     Readmission Risk Interventions     No data to display

## 2024-04-05 NOTE — Progress Notes (Signed)
 Orthopaedic Trauma Progress Note  SUBJECTIVE: Patient doing ok today. Mobilized to bedside chair with therapies but was unable to put weight on left leg due to pain. I reviewed MRI left knee that was done yesterday with patient.  Imaging shows subchondral impaction fracture of the posterior lateral tibial plateau.  Has some associated degenerative meniscus tears.  No other acute bony abnormalities noted.  Discussed with patient that while this injury is painful, will not require surgery.  OBJECTIVE:  Vitals:   04/05/24 0350 04/05/24 0933  BP: (!) 148/82 112/71  Pulse: 61   Resp: 17 16  Temp: 97.7 F (36.5 C) 97.8 F (36.6 C)  SpO2: 95% 96%    Opiates Today (MME): Today's  total administered Morphine  Milligram Equivalents: 15 Opiates Yesterday (MME): Yesterday's total administered Morphine  Milligram Equivalents: 22.5  General: Sitting up in bedside chair, NAD Respiratory: No increased work of breathing.  Operative Extremity (LLE): Compressive knee sleeve on knee, removed for exam. Moderate knee effusion. Pain with palpation about the knee. Holds knee in slight flexed position, increased pain in the knee with motion. Ankle DF/PF intact. Endorses sensation throughout extremity. +DP pulse  IMAGING: X-ray and CT scan left knee show tricompartmental osteoarthritis.    MRI left knee shows small subchondral impaction type fracture involving the lateral tibial plateau with surrounding marrow edema.  Intact ligamentous structures.  Large knee effusion noted.  LABS:  Results for orders placed or performed during the hospital encounter of 04/02/24 (from the past 24 hours)  CBC     Status: Abnormal   Collection Time: 04/05/24  2:36 AM  Result Value Ref Range   WBC 8.6 4.0 - 10.5 K/uL   RBC 3.72 (L) 4.22 - 5.81 MIL/uL   Hemoglobin 12.6 (L) 13.0 - 17.0 g/dL   HCT 63.1 (L) 60.9 - 47.9 %   MCV 98.9 80.0 - 100.0 fL   MCH 33.9 26.0 - 34.0 pg   MCHC 34.2 30.0 - 36.0 g/dL   RDW 87.2 88.4 - 84.4 %    Platelets 146 (L) 150 - 400 K/uL   nRBC 0.0 0.0 - 0.2 %   *Note: Due to a large number of results and/or encounters for the requested time period, some results have not been displayed. A complete set of results can be found in Results Review.    ASSESSMENT: [name] [name] is a 89 y.o. male with known tricompartmental osteoarthritis of the left knee s/p arthrocentesis with 80 cc of bloody joint fluid aspirated on 04/03/2024  CV/Blood loss: Hgb 12.6 this morning.  Hemodynamically stable  PLAN: Weightbearing: WBAT LLE ROM: Unrestricted ROM Incisional and dressing care: Change bandage to the left knee as needed Orthopedic device(s): None  Pain management:  1. Tylenol  650 mg q 4 hours scheduled 2.  Flexeril  7.5 mg 3 times daily PRN 3. Oxycodone  5 mg q 4 hours PRN 4. Dilaudid  0.5-1 mg q 4 hours PRN VTE prophylaxis: Eliquis  Dispo: Patient with impaction fracture left tibial plateau. No surgical intervention required. Continue WBAT.  No ROM restrictions.  Okay for discharge from Ortho standpoint once cleared by medicine team and therapies  Follow - up plan: 2 weeks for repeat x-rays   Contact information:  Franky Light MD, Lauraine Moores PA-C. After hours and holidays please check [email or web address] for group call information for [facility]   Lauraine PATRIC Moores, PA-C [phone] (office) [email or web address]

## 2024-04-05 NOTE — Evaluation (Signed)
 Speech Language Pathology Evaluation Patient Details Name: [name] [name] MRN: [id number] DOB: 10-22-1934 Today's Date: 04/05/2024 Time: 8980-8953 SLP Time Calculation (min) (ACUTE ONLY): 27 min  Problem List:  Patient Active Problem List   Diagnosis Date Noted   Acute CVA (cerebrovascular accident) (HCC) 04/02/2024   Shingles 12/08/2023   Sinus pressure 06/25/2023   History of CVA in adulthood 01/23/2023   Pain of lower extremity 01/23/2023   RBBB 11/15/2022   S/P TAVR (transcatheter aortic valve replacement) 11/15/2022   DNR (do not resuscitate) 08/08/2022   Permanent atrial fibrillation (HCC) 02/22/2022   Chronic pain of both knees 12/25/2021   Bilateral leg pain 09/28/2021   Advance care planning 10/28/2020   Bilateral pseudophakia 07/27/2020   Carpal tunnel syndrome 07/27/2020   Dermatochalasis 07/27/2020   Hematuria 07/27/2020   Insomnia 07/27/2020   Radiculopathy 07/27/2020   Stenosis of internal carotid artery with cerebral infarction, right (HCC) 07/08/2020   Imbalance 03/27/2020   Retinal artery occlusion 03/10/2020   Constipation 12/22/2019   Rhinitis sicca 05/22/2018   Sciatica 12/28/2017   Neuropathy 10/07/2017   Foot swelling 10/07/2017   CAD (coronary artery disease), autologous vein bypass graft 10/31/2016   Family history of colon cancer 10/31/2016   History of adenomatous polyp of colon 10/31/2016   Osteoarthritis 10/31/2016   Skin lesion 06/04/2015   Elevated TSH 12/26/2014   Severe aortic stenosis 05/16/2013   Bladder neoplasm 11/23/2012   BCC (basal cell carcinoma of skin) 08/29/2012   Dizziness 08/24/2012   GERD (gastroesophageal reflux disease) 05/01/2012   Hyperlipidemia 03/14/2012   TMJ pain dysfunction syndrome 01/20/2012   Long term (current) use of anticoagulants 08/31/2011   RVOT ventricular tachycardia (HCC)    Cough 04/29/2009   HEARING LOSS, SUDDEN 12/03/2007   HTN (hypertension) 02/22/2007   S/P CABG x 5 2004   Past Medical  History:  Past Medical History:  Diagnosis Date   Arthritis    Atrial fibrillation, permanent (HCC)    Bladder neoplasm    Chronic cough    NO CARDIAC OR PULMONARY RELATED   Coronary artery disease CARDIOLOGIST-  DR KLEIN/ [facility]   a. s/p CABG 2004, b. LHC with patent grafts 07/2005, ejection fraction 50%;  c. 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 Amiodarone  Rx   S/P CABG x 5 2004   S/P TAVR (transcatheter aortic valve replacement) 11/15/2022   s/p TAVR with a 29mm Edwards S3UR via the TF approach by [name] [name].   Severe aortic stenosis    Past Surgical History:  Past Surgical History:  Procedure Laterality Date   CARDIAC CATHETERIZATION  07-26-2005  DR GAMBLE   PRESERVED LVF/  EF 50%/ PATENT GRAFTS   CARDIAC CATHETERIZATION  03-04-2003  DR GAMBLE   SEVERE 3 VESSEL DISEASE   CARDIAC CATHETERIZATION  1991  DR [facility]   PAF/ FALSE POSITIVE STRESS TEST   CAROTID ENDARTERECTOMY Left 2022   CATARACT EXTRACTION W/ INTRAOCULAR LENS IMPLANT Right    CORONARY ARTERY BYPASS GRAFT  03-08-2003  DR HZMYJMIU   LIMA TO LAD/ SVG TO OM2/  SVG TO DIAGONAL /  SVG TO PDA & PLA   CYSTOSCOPY WITH BIOPSY N/A 12/25/2012   Procedure: CYSTOSCOPY WITH BLADDER BIOPSY;  Surgeon: [name] [name] Brooks, MD;  Location: [facility];  Service: Urology;  Laterality: N/A;   ELECTROPHYSIOLOGY STUDY  07-27-2005  DR DANELLE [place]   MAPPING --   RESULT NONINDUCIBLE VT OR SVT/  DX RIGHT VENTRICULAR OUTFLOW TRACT VT AND RULES OUT MORE MALIGNANT CAUSES OF VT   ENDARTERECTOMY Left 07/08/2020   Procedure: LEFT CAROTID ENDARTERECTOMY;  Surgeon:  [name] [name] ORN, MD;  Location: [facility] OR;  Service: Vascular;  Laterality: Left;   INGUINAL HERNIA REPAIR Bilateral 1954  &  1990   INTRAOPERATIVE TRANSTHORACIC ECHOCARDIOGRAM N/A 11/15/2022   Procedure: INTRAOPERATIVE TRANSTHORACIC ECHOCARDIOGRAM;  Surgeon: [name] Sharper, MD;  Location: [facility] INVASIVE CV LAB;  Service: Open Heart Surgery;  Laterality: N/A;   KNEE ARTHROSCOPY Right 1994   LEFT HEART CATH AND CORS/GRAFTS ANGIOGRAPHY N/A 10/14/2022   Procedure: LEFT HEART CATH AND CORS/GRAFTS ANGIOGRAPHY;  Surgeon: [name] Sharper, MD;  Location: [facility] INVASIVE CV LAB;  Service: Cardiovascular;  Laterality: N/A;   LUMBAR DISC SURGERY  1999   L4 -- L5   MOHS SURGERY     by Dr. Lyle 2019   PACEMAKER IMPLANT N/A 04/03/2023   Procedure: PACEMAKER IMPLANT;  Surgeon: [name] [name] BROCKS, MD;  Location: [facility] INVASIVE CV LAB;  Service: Cardiovascular;  Laterality: N/A;   REMOVAL VOCAL CORD POLYPS  1978   TONSILLECTOMY  AS CHILD   TRANSCATHETER AORTIC VALVE REPLACEMENT, TRANSFEMORAL N/A 11/15/2022   Procedure: Transcatheter Aortic Valve Replacement, Transfemoral;  Surgeon: [name] Sharper, MD;  Location: [facility] INVASIVE CV LAB;  Service: Open Heart Surgery;  Laterality: N/A;   TRANSTHORACIC ECHOCARDIOGRAM  08/08/2011   MILD LVH/  EF 55-60%/  GRADE I DIASTOLIC DYSFUNCTION/ MILD AV STENOSIS   HPI:  [name] is an 89 year old male who presented to [facility] from home on 04/02/24 for dizziness and fall. MRI with acute punctate infarcts in bilateral frontal lobes. PMH - afib, htn, cabg, TAVR, CVA, bladder CVA. SLP consulted for speech/language/cognitive assessment.   Assessment / Plan / Recommendation Clinical Impression   Pt presents with cognitive-linguistic deficits characterized by word-finding difficulty in conversation, reduced working memory, reduced attention, and reduced thought organization/sequencing. Further assessment needed of problem solving and higher level auditory comprehension.   Pt reported progressive  memory and word-finding decline over the past 6 months. 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 He does exhibit awareness of his deficits and has some frustration with word-finding difficulties or lost train of thought. Pt benefited from guiding questions to maintain topic when his sentences would trail off.   Pt would benefit from ongoing SLP services to address cognitive and language goals in effort to maximize pt independence and reduce caregiver burden. Continue SLP services during acute hospitalization and recommend ongoing SLP services at next level of care.     SLP Assessment  SLP Recommendation/Assessment: Patient needs continued Speech Language Pathology Services SLP Visit Diagnosis: Cognitive communication deficit (R41.841)     Assistance Recommended at Discharge  Intermittent Supervision/Assistance  Functional Status Assessment Patient has had a recent decline in their functional status and demonstrates the ability to make significant improvements in function in a reasonable and predictable amount of time.  Frequency and Duration min 1 x/week  4 weeks  SLP Evaluation Cognition  Overall Cognitive Status: History of cognitive impairments - at baseline Arousal/Alertness: Awake/alert Orientation Level: Oriented to person;Oriented to place;Oriented to time;Disoriented to situation Year: 2025 Month: December Day of Week: Correct Attention: Focused Focused Attention: Impaired Focused Attention Impairment: Verbal basic Memory: Impaired Memory Impairment: Retrieval deficit;Storage deficit;Decreased short term memory Decreased Short Term Memory: Verbal basic Awareness: Appears intact Executive Function: Organizing;Sequencing Sequencing: Impaired Sequencing Impairment: Verbal basic Organizing: Impaired Organizing Impairment: Verbal  basic       Comprehension  Auditory Comprehension Overall Auditory Comprehension: Appears within functional limits for tasks assessed (further assessment needed; pt is HOH without hearing aids present) Yes/No Questions: Within Functional Limits Commands: Within Functional Limits Conversation: Simple Interfering Components: Hearing EffectiveTechniques: Extra processing time;Repetition    Expression Expression Primary Mode of Expression: Verbal Verbal Expression Overall Verbal Expression: Impaired Initiation: No impairment Automatic Speech: Name;Social Response;Day of week Level of Generative/Spontaneous Verbalization: Conversation Repetition: Impaired Level of Impairment: Sentence level Naming: No impairment Pragmatics: No impairment Interfering Components: Attention Effective Techniques: Open ended questions Non-Verbal Means of Communication: Not applicable   Oral / Motor  Oral Motor/Sensory Function Overall Oral Motor/Sensory Function: Within functional limits Motor Speech Overall Motor Speech: Appears within functional limits for tasks assessed Respiration: Within functional limits Articulation: Within functional limitis Intelligibility: Intelligible Motor Planning: Within functional limits Motor Speech Errors: Not applicable            Peyton JINNY Rummer 04/05/2024, 11:01 AM

## 2024-04-08 ENCOUNTER — Non-Acute Institutional Stay (SKILLED_NURSING_FACILITY): Payer: Self-pay | Admitting: Orthopedic Surgery

## 2024-04-08 ENCOUNTER — Encounter: Payer: Self-pay | Admitting: Orthopedic Surgery

## 2024-04-08 ENCOUNTER — Other Ambulatory Visit: Payer: Self-pay | Admitting: Orthopedic Surgery

## 2024-04-08 DIAGNOSIS — Z952 Presence of prosthetic heart valve: Secondary | ICD-10-CM

## 2024-04-08 DIAGNOSIS — S82142D Displaced bicondylar fracture of left tibia, subsequent encounter for closed fracture with routine healing: Secondary | ICD-10-CM | POA: Diagnosis not present

## 2024-04-08 DIAGNOSIS — I10 Essential (primary) hypertension: Secondary | ICD-10-CM

## 2024-04-08 DIAGNOSIS — Z8673 Personal history of transient ischemic attack (TIA), and cerebral infarction without residual deficits: Secondary | ICD-10-CM

## 2024-04-08 DIAGNOSIS — I442 Atrioventricular block, complete: Secondary | ICD-10-CM | POA: Diagnosis not present

## 2024-04-08 DIAGNOSIS — M25562 Pain in left knee: Secondary | ICD-10-CM

## 2024-04-08 DIAGNOSIS — I25810 Atherosclerosis of coronary artery bypass graft(s) without angina pectoris: Secondary | ICD-10-CM

## 2024-04-08 DIAGNOSIS — F329 Major depressive disorder, single episode, unspecified: Secondary | ICD-10-CM

## 2024-04-08 DIAGNOSIS — S51011D Laceration without foreign body of right elbow, subsequent encounter: Secondary | ICD-10-CM

## 2024-04-08 MED ORDER — OXYCODONE-ACETAMINOPHEN 5-325 MG PO TABS: 1.0000 | ORAL_TABLET | Freq: Four times a day (QID) | ORAL | 0 refills | Status: AC | PRN

## 2024-04-08 NOTE — Progress Notes (Unsigned)
 Location:  Other [place].  Nursing Home Room Number: [facility] [id number] Place of Service:  SNF [id number]) Provider:  Greig Cluster, NP  PCP: Cleatus Arlyss RAMAN, MD  Patient Care Team: Cleatus Arlyss RAMAN, MD as PCP - General (Family Medicine) Inocencio Soyla Lunger, MD as PCP - Electrophysiology (Cardiology) Jeffrie Oneil BROCKS, MD as PCP - Cardiology (Cardiology) Fate Morna SAILOR, [facility] (Inactive) as Pharmacist (Pharmacist) [facility]  Extended Emergency Contact Information Primary Emergency Contact: [facility] Mobile Phone: [phone] Relation: Son Secondary Emergency Contact: [name],[name] Mobile Phone: [phone] Relation: Son  Code Status:  DNR Goals of care: Advanced Directive information    04/04/2024   11:37 AM  Advanced Directives  Does Patient Have a Medical Advance Directive? Yes  Type of Advance Directive Living will  Does patient want to make changes to medical advance directive? No - Patient declined     Chief Complaint  Patient presents with   Hospitalization Follow-up    Hospital Follow up    HPI:  Pt is a 89 y.o. male seen today for f/u s/p hospitalization 12/09-12/12 due to fall and CVA.   He currently resides on the rehab unit at [facility]. PMH: PAF, CAD s/p CABG x 5, aortic stenosis s/p TAVR 2024, CHB s/p PPM 03/2023, HTN, GERD, neuropathy, OA, bladder neoplasm and insomnia.   12/09 he had dizziness and fell on brick steps at home. Xray left hip/pelvis negative for fracture. CXR unremarkable. CT head negative for acute intracranial abnormality. Due to concerns of CVA in ED, MRI brain was performed. MRI noted acute lacunar infarct in left and right frontal white matter. Neurology thought CVA embolic and advised to continue Eliquis . CT left knee and MRI knee confirmed large joint effusion as well as lateral tibial plateau fracture. Orthopedics performed arthrocentesis at bedside. Surgery was not recommended. He was discharged to SNF  for additional PT/OT/nursing services.   Today, he is accompanied by son. He reports increased pain to left knee. He is receiving oxycodone  prn. WBAT. He was able to stand with PT/OT this morning. He reports increased depression since hospitalization. Wife passed earlier this year ( at [facility]). Current injury has made mood worse. Denies suicide. Afebrile. Vitals stable.   Past Medical History:  Diagnosis Date   Arthritis    Atrial fibrillation, permanent (HCC)    Bladder neoplasm    Chronic cough    NO CARDIAC OR PULMONARY RELATED   Coronary artery disease CARDIOLOGIST-  DR KLEIN/ ALLRED   a. s/p CABG 2004, b. LHC with patent grafts 07/2005, ejection fraction 50%;  c. 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 Amiodarone  Rx   S/P CABG x 5 2004   S/P TAVR (transcatheter aortic valve replacement) 11/15/2022   s/p TAVR with a 29mm Edwards S3UR via the TF approach by Dr. Wonda & Maryjane.   Severe aortic stenosis    Past Surgical History:  Procedure Laterality Date   CARDIAC CATHETERIZATION  07-26-2005  DR GAMBLE   PRESERVED LVF/  EF 50%/ PATENT GRAFTS   CARDIAC CATHETERIZATION  03-04-2003  DR GAMBLE   SEVERE  3 VESSEL DISEASE   CARDIAC CATHETERIZATION  1991  DR [facility]   PAF/ FALSE POSITIVE STRESS TEST   CAROTID ENDARTERECTOMY Left 2022   CATARACT EXTRACTION W/ INTRAOCULAR LENS IMPLANT Right    CORONARY ARTERY BYPASS GRAFT  03-08-2003  DR [id number]   LIMA TO LAD/ SVG TO OM2/  SVG TO DIAGONAL / SVG TO PDA & PLA   CYSTOSCOPY WITH BIOPSY N/A 12/25/2012   Procedure: CYSTOSCOPY WITH BLADDER BIOPSY;  Surgeon: Donnice Gwenyth Brooks, MD;  Location:  [facility];  Service: Urology;  Laterality: N/A;   ELECTROPHYSIOLOGY STUDY  07-27-2005  DR DANELLE [place]   MAPPING --   RESULT NONINDUCIBLE VT OR SVT/  DX RIGHT VENTRICULAR OUTFLOW TRACT VT AND RULES OUT MORE MALIGNANT CAUSES OF VT   ENDARTERECTOMY Left 07/08/2020   Procedure: LEFT CAROTID ENDARTERECTOMY;  Surgeon: Serene Gaile ORN, MD;  Location: [facility] OR;  Service: Vascular;  Laterality: Left;   INGUINAL HERNIA REPAIR Bilateral 1954  &  1990   INTRAOPERATIVE TRANSTHORACIC ECHOCARDIOGRAM N/A 11/15/2022   Procedure: INTRAOPERATIVE TRANSTHORACIC ECHOCARDIOGRAM;  Surgeon: Wonda Sharper, MD;  Location: [facility] INVASIVE CV LAB;  Service: Open Heart Surgery;  Laterality: N/A;   KNEE ARTHROSCOPY Right 1994   LEFT HEART CATH AND CORS/GRAFTS ANGIOGRAPHY N/A 10/14/2022   Procedure: LEFT HEART CATH AND CORS/GRAFTS ANGIOGRAPHY;  Surgeon: Wonda Sharper, MD;  Location: [facility] INVASIVE CV LAB;  Service: Cardiovascular;  Laterality: N/A;   LUMBAR DISC SURGERY  1999   L4 -- L5   MOHS SURGERY     by Dr. Lyle 2019   PACEMAKER IMPLANT N/A 04/03/2023   Procedure: PACEMAKER IMPLANT;  Surgeon: Fernande Elspeth BROCKS, MD;  Location: [facility] INVASIVE CV LAB;  Service: Cardiovascular;  Laterality: N/A;   REMOVAL VOCAL CORD POLYPS  1978   TONSILLECTOMY  AS CHILD   TRANSCATHETER AORTIC VALVE REPLACEMENT, TRANSFEMORAL N/A 11/15/2022   Procedure: Transcatheter Aortic Valve Replacement, Transfemoral;  Surgeon: Wonda Sharper, MD;  Location: [facility] INVASIVE CV LAB;  Service: Open Heart Surgery;  Laterality: N/A;   TRANSTHORACIC ECHOCARDIOGRAM  08/08/2011   MILD LVH/  EF 55-60%/  GRADE I DIASTOLIC DYSFUNCTION/ MILD AV STENOSIS    Allergies[1]  Outpatient Encounter Medications as of 04/08/2024  Medication Sig   acetaminophen  (TYLENOL ) 500 MG tablet Take 500-1,000 mg by mouth every 6 (six) hours as needed (pain.).   apixaban  (ELIQUIS ) 5 MG TABS tablet Take 1 tablet (5 mg total) by mouth 2 (two) times daily. Take evening dose  starting 07/09/20   butalbital -acetaminophen -caffeine  (FIORICET ) 50-325-40 MG tablet Take 1 tablet by mouth 3 (three) times daily as needed for headache.   cetirizine  (ZYRTEC ) 10 MG tablet Take 1 tablet (10 mg total) by mouth daily.   hydrochlorothiazide  (HYDRODIURIL ) 25 MG tablet Take 1 tablet (25 mg total) by mouth daily.   Iron , Ferrous Sulfate , 325 (65 Fe) MG TABS Take 325 mg by mouth daily.   latanoprost  (XALATAN ) 0.005 % ophthalmic solution Place 1 drop into both eyes at bedtime.   oxyCODONE  (OXY IR/ROXICODONE ) 5 MG immediate release tablet Take 1 tablet (5 mg total) by mouth every 6 (six) hours as needed for severe pain (pain score 7-10).   Polyethyl Glycol-Propyl Glycol (SYSTANE ULTRA OP) Place 1-2 drops into both eyes once as needed (dry eyes.).   polyethylene glycol (MIRALAX ) 17 g packet Take 17 g by mouth daily.   potassium chloride  (KLOR-CON  M) 10 MEQ tablet Take 10 mEq by mouth in the morning.   pravastatin  (PRAVACHOL ) 80 MG tablet Take  1 tablet (80 mg total) by mouth every evening.   senna-docusate (SENOKOT-S) 8.6-50 MG tablet Take 1 tablet by mouth at bedtime as needed for mild constipation or moderate constipation.   No facility-administered encounter medications on file as of 04/08/2024.    Review of Systems  Constitutional:  Negative for fatigue and fever.  HENT:  Negative for sore throat and trouble swallowing.   Eyes:  Negative for visual disturbance.  Respiratory:  Negative for cough and shortness of breath.   Cardiovascular:  Negative for chest pain and leg swelling.  Gastrointestinal:  Negative for abdominal distention and abdominal pain.  Genitourinary:  Negative for dysuria and hematuria.  Musculoskeletal:  Positive for arthralgias and gait problem.  Skin:  Positive for wound.  Neurological:  Positive for weakness. Negative for dizziness and headaches.  Psychiatric/Behavioral:  Positive for dysphoric mood. Negative for confusion and sleep disturbance. The patient is  not nervous/anxious.     Immunization History  Administered Date(s) Administered   Fluad Quad(high Dose 65+) 12/28/2018, 02/17/2020   Fluad Trivalent(High Dose 65+) 01/23/2023   INFLUENZA, HIGH DOSE SEASONAL PF 12/25/2015, 02/19/2017, 01/28/2021   Influenza Split 03/15/2011, 01/19/2012   Influenza Whole 03/11/2002, 02/24/2009, 01/14/2010   Influenza,inj,Quad PF,6+ Mos 01/16/2013, 01/22/2014, 12/25/2014, 01/19/2016, 01/23/2017, 01/04/2018   Influenza-Unspecified 03/11/2002, 02/19/2003, 01/24/2004, 02/23/2005, 01/23/2006, 04/03/2007, 02/21/2008, 01/23/2009, 02/23/2009, 12/24/2009, 02/12/2020   PFIZER(Purple Top)SARS-COV-2 Vaccination 06/28/2019, 07/19/2019, 09/18/2019, 03/24/2020   Pneumococcal Conjugate-13 11/27/2013, 12/25/2014   Pneumococcal Polysaccharide-23 04/17/2002   Pneumococcal-Unspecified 03/11/2002   Td 12/07/2001, 04/26/2005, 05/11/2005   Td,absorbed, Preservative Free, Adult Use, Lf Unspecified 10/25/2021   Tdap 12/08/2011, 10/25/2021   Tetanus 11/24/2011   Zoster Recombinant(Shingrix) 08/20/2020, 01/01/2021   Zoster, Live 05/17/2007   Pertinent  Health Maintenance Due  Topic Date Due   Colonoscopy  05/30/2023   Influenza Vaccine  07/23/2024 (Originally 11/24/2023)      10/07/2022   10:41 AM 01/23/2023   12:42 PM 02/13/2023    3:46 PM 08/21/2023    2:09 PM 12/08/2023   12:32 PM  Fall Risk  Falls in the past year? 1 0 1 1 1   Was there an injury with Fall? 0  0  1  0  0   Was there an injury with Fall? - Comments   fell due to stroke     Fall Risk Category Calculator 1 0 2 2 1   Patient at Risk for Falls Due to No Fall Risks No Fall Risks No Fall Risks History of fall(s) Impaired mobility;History of fall(s)  Fall risk Follow up Falls evaluation completed Falls evaluation completed Education provided;Falls prevention discussed Falls evaluation completed Falls evaluation completed     Data saved with a previous flowsheet row definition   Functional Status Survey:     Vitals:   04/08/24 1020 04/08/24 1024  BP: (!) 165/85 (!) 140/77  Pulse: 86   Resp: 18   Temp: (!) 97.3 F (36.3 C)   SpO2: 96%   Weight: 167 lb (75.8 kg)   Height: 5' 10 (1.778 m)    Body mass index is 23.96 kg/m. Physical Exam Vitals reviewed.  Constitutional:      General: He is not in acute distress. HENT:     Head: Normocephalic.  Eyes:     General:        Right eye: No discharge.        Left eye: No discharge.  Cardiovascular:     Rate and Rhythm: Normal rate and regular rhythm.     Pulses:  Normal pulses.     Heart sounds: Normal heart sounds.  Pulmonary:     Effort: Pulmonary effort is normal.     Breath sounds: Normal breath sounds.  Abdominal:     General: Bowel sounds are normal. There is no distension.     Palpations: Abdomen is soft.     Tenderness: There is no abdominal tenderness.  Musculoskeletal:     Cervical back: Neck supple.     Left knee: Swelling present. Decreased range of motion. No tenderness.     Right lower leg: No edema.     Left lower leg: No edema.  Skin:    General: Skin is warm.     Capillary Refill: Capillary refill takes less than 2 seconds.     Comments: Large skin tear with bruising to right elbow, CDI, no infection   Neurological:     General: No focal deficit present.     Mental Status: He is alert. Mental status is at baseline.     Gait: Gait abnormal.  Psychiatric:        Mood and Affect: Mood normal.     Labs reviewed: Recent Labs    01/01/24 0936 04/02/24 0845 04/03/24 0114  NA 139 140 138  K 3.4* 3.3* 3.7  CL 99 101 102  CO2 27 29 26   GLUCOSE 104* 99 148*  BUN 14 14 14   CREATININE 0.88 0.77 1.03  CALCIUM  9.2 9.3 8.7*  MG  --  2.1  --    Recent Labs    12/28/23 0947 04/02/24 0845 04/03/24 0114  AST 25 28 40  ALT 13 15 16   ALKPHOS 63 62 53  BILITOT 0.7 1.6* 1.9*  PROT 7.2 7.1 7.0  ALBUMIN 4.0 3.9 3.7   Recent Labs    04/18/23 1120 12/28/23 0947 01/01/24 0936 04/02/24 0845 04/03/24 0114  04/05/24 0236  WBC 5.1 4.6   < > 7.5 10.6* 8.6  NEUTROABS 3.2 2.7  --   --   --   --   HGB 10.9* 12.8*   < > 14.5 14.1 12.6*  HCT 32.5* 38.3*   < > 42.5 42.3 36.8*  MCV 97.1 96.5   < > 99.3 98.1 98.9  PLT 191.0 153.0   < > 135* 147* 146*   < > = values in this interval not displayed.   Lab Results  Component Value Date   TSH 1.89 12/28/2023   Lab Results  Component Value Date   HGBA1C 5.2 04/03/2024   Lab Results  Component Value Date   CHOL 140 04/03/2024   HDL 70 04/03/2024   LDLCALC 62 04/03/2024   LDLDIRECT 88 07/27/2020   TRIG 41 04/03/2024   CHOLHDL 2.0 04/03/2024    Significant Diagnostic Results in last 30 days:  MR KNEE LEFT WO CONTRAST Result Date: 04/04/2024 CLINICAL DATA:  Clemens.  Left knee pain. EXAM: MRI OF THE LEFT KNEE WITHOUT CONTRAST TECHNIQUE: Multiplanar, multisequence MR imaging of the knee was performed. No intravenous contrast was administered. COMPARISON:  Radiographs and CT scan 04/02/2024 FINDINGS: MENISCI Medial meniscus: Horizontal cleavage and free edge tears involving the posterior horn and midbody junction region. Lateral meniscus:  Intact LIGAMENTS Cruciates:  Intact Collaterals:  Intact.  Mild MCL and pes anserine bursitis. CARTILAGE Patellofemoral: Advanced degenerative chondrosis with full-thickness cartilage loss. Medial: Moderate degenerative chondrosis with cartilage thinning, fraying and fibrillation Lateral: Moderate degenerative chondrosis mainly involving the tibial articular cartilage. Joint: There is a very large joint effusion which is complicated by  hematoma. There is also a long and thick ligamentum mucosa noted. Areas of synovial thickening are also noted. Popliteal Fossa: Moderate-sized leaking Baker's cyst also containing complex fluid, likely hematoma. Extensor Mechanism: The patella retinacular structures are intact and the quadriceps and patellar tendons are intact. Bones: There is a small subchondral impaction type fracture involving  the lateral tibial plateau posteriorly with surrounding marrow edema. Small defects in the subchondral plate. No femur or fibular fracture. No patellar fracture. Other: Unremarkable knee musculature. IMPRESSION: 1. Small subchondral impaction type fracture involving the lateral tibial plateau posteriorly with surrounding marrow edema. 2. Horizontal cleavage and free edge tears involving the posterior horn and midbody junction region of the medial meniscus. 3. Intact ligamentous structures. 4. Very large joint effusion which is complicated by hematoma. There is also a long and thick ligamentum mucosa noted. Areas of synovial thickening are also noted. 5. Moderate-sized leaking Baker's cyst also containing complex fluid, likely hematoma. 6. Tricompartmental degenerative changes most significant in the patellofemoral joint. Electronically Signed   By: MYRTIS Stammer M.D.   On: 04/04/2024 21:07   ECHOCARDIOGRAM COMPLETE BUBBLE STUDY Result Date: 04/03/2024    ECHOCARDIOGRAM REPORT   Patient Name:   AADON GORELIK Date of Exam: 04/03/2024 Medical Rec #:  [id number]           Height:       70.0 in Accession #:    [id number]          Weight:       169.8 lb Date of Birth:  1935/01/23           BSA:          1.947 m Patient Age:    89 years            BP:           150/74 mmHg Patient Gender: M                   HR:           58 bpm. Exam Location:  Inpatient Procedure: 2D Echo, Cardiac Doppler, Color Doppler and Saline Contrast Bubble            Study (Both Spectral and Color Flow Doppler were utilized during            procedure). Indications:    Stroke  History:        Patient has prior history of Echocardiogram examinations, most                 recent 10/26/2023. CAD, Arrythmias:Atrial Fibrillation; Risk                 Factors:Hypertension, Dyslipidemia and Former Smoker.                 Aortic Valve: 29 mm Sapien prosthetic, stented (TAVR) valve is                 present in the aortic position. Procedure Date:  11/15/2022.  Sonographer:    Merlynn Argyle Referring Phys: [id number] MARSA NOVAK MELVIN  Sonographer Comments: Image acquisition challenging due to respiratory motion. IMPRESSIONS  1. Left ventricular ejection fraction, by estimation, is 45 to 50%. The left ventricle has mildly decreased function. The left ventricle demonstrates regional wall motion abnormalities (see scoring diagram/findings for description). The left ventricular  internal cavity size was mildly dilated. There is mild concentric left ventricular hypertrophy. Left ventricular diastolic parameters are indeterminate.  2. Right ventricular  systolic function is normal. The right ventricular size is mildly enlarged. There is mildly elevated pulmonary artery systolic pressure. The estimated right ventricular systolic pressure is 37.4 mmHg.  3. Left atrial size was severely dilated.  4. The mitral valve is degenerative. Mild mitral valve regurgitation. No evidence of mitral stenosis. Severe mitral annular calcification.  5. Mild perivalvular leak. The aortic valve has been repaired/replaced. Aortic valve regurgitation is mild. No aortic stenosis is present. There is a 29 mm Sapien prosthetic (TAVR) valve present in the aortic position. Procedure Date: 11/15/2022. Echo findings are consistent with perivalvular leak of the aortic prosthesis.  6. Aortic dilatation noted. There is mild dilatation of the ascending aorta, measuring 42 mm.  7. The inferior vena cava is normal in size with greater than 50% respiratory variability, suggesting right atrial pressure of 3 mmHg.  8. Agitated saline contrast bubble study was negative, with no evidence of any interatrial shunt. Conclusion(s)/Recommendation(s): No intracardiac source of embolism detected on this transthoracic study. Consider a transesophageal echocardiogram to exclude cardiac source of embolism if clinically indicated. FINDINGS  Left Ventricle: Left ventricular ejection fraction, by estimation, is 45 to 50%. The  left ventricle has mildly decreased function. The left ventricle demonstrates regional wall motion abnormalities. The left ventricular internal cavity size was mildly dilated. There is mild concentric left ventricular hypertrophy. Abnormal (paradoxical) septal motion, consistent with left bundle branch block. Left ventricular diastolic parameters are indeterminate.  LV Wall Scoring: The mid inferoseptal segment is hypokinetic. Right Ventricle: The right ventricular size is mildly enlarged. No increase in right ventricular wall thickness. Right ventricular systolic function is normal. There is mildly elevated pulmonary artery systolic pressure. The tricuspid regurgitant velocity is 2.71 m/s, and with an assumed right atrial pressure of 8 mmHg, the estimated right ventricular systolic pressure is 37.4 mmHg. Left Atrium: Left atrial size was severely dilated. Right Atrium: Right atrial size was normal in size. Pericardium: There is no evidence of pericardial effusion. Mitral Valve: The mitral valve is degenerative in appearance. Severe mitral annular calcification. Mild mitral valve regurgitation. No evidence of mitral valve stenosis. MV peak gradient, 5.0 mmHg. The mean mitral valve gradient is 2.0 mmHg. Tricuspid Valve: The tricuspid valve is normal in structure. Tricuspid valve regurgitation is mild . No evidence of tricuspid stenosis. Aortic Valve: Mild perivalvular leak. The aortic valve has been repaired/replaced. Aortic valve regurgitation is mild. No aortic stenosis is present. Aortic valve mean gradient measures 8.0 mmHg. Aortic valve peak gradient measures 16.5 mmHg. Aortic valve area, by VTI measures 1.87 cm. There is a 29 mm Sapien prosthetic, stented (TAVR) valve present in the aortic position. Procedure Date: 11/15/2022. Pulmonic Valve: The pulmonic valve was normal in structure. Pulmonic valve regurgitation is mild to moderate. No evidence of pulmonic stenosis. Aorta: Aortic dilatation noted. There is  mild dilatation of the ascending aorta, measuring 42 mm. Venous: The inferior vena cava is normal in size with greater than 50% respiratory variability, suggesting right atrial pressure of 3 mmHg. IAS/Shunts: No atrial level shunt detected by color flow Doppler. Agitated saline contrast was given intravenously to evaluate for intracardiac shunting. Agitated saline contrast bubble study was negative, with no evidence of any interatrial shunt. Additional Comments: A device lead is visualized in the right ventricle.  LEFT VENTRICLE PLAX 2D LVIDd:         5.70 cm   Diastology LVIDs:         3.90 cm   LV e' medial: 6.98 cm/s LV PW:  1.20 cm LV IVS:        1.20 cm LVOT diam:     2.20 cm LV SV:         70 LV SV Index:   36 LVOT Area:     3.80 cm  RIGHT VENTRICLE             IVC RV Basal diam:  4.30 cm     IVC diam: 2.30 cm RV Mid diam:    3.40 cm RV S prime:     11.60 cm/s TAPSE (M-mode): 1.5 cm LEFT ATRIUM              Index        RIGHT ATRIUM           Index LA diam:        4.10 cm  2.11 cm/m   RA Area:     21.90 cm LA Vol (A2C):   121.0 ml 62.15 ml/m  RA Volume:   60.20 ml  30.92 ml/m LA Vol (A4C):   62.2 ml  31.95 ml/m LA Biplane Vol: 88.3 ml  45.36 ml/m  AORTIC VALVE AV Area (Vmax):    1.74 cm AV Area (Vmean):   1.89 cm AV Area (VTI):     1.87 cm AV Vmax:           203.25 cm/s AV Vmean:          130.500 cm/s AV VTI:            0.372 m AV Peak Grad:      16.5 mmHg AV Mean Grad:      8.0 mmHg LVOT Vmax:         93.00 cm/s LVOT Vmean:        64.950 cm/s LVOT VTI:          0.183 m LVOT/AV VTI ratio: 0.49  AORTA Ao Root diam: 3.90 cm Ao Asc diam:  4.20 cm MITRAL VALVE             TRICUSPID VALVE MV Area VTI:  2.45 cm   TR Peak grad:   29.4 mmHg MV Peak grad: 5.0 mmHg   TR Vmax:        271.00 cm/s MV Mean grad: 2.0 mmHg MV Vmax:      1.12 m/s   SHUNTS MV Vmean:     59.6 cm/s  Systemic VTI:  0.18 m                          Systemic Diam: 2.20 cm Oneil Parchment MD Electronically signed by Oneil Parchment MD  Signature Date/Time: 04/03/2024/3:34:13 PM    Final    CT ANGIO HEAD NECK W WO CM Result Date: 04/02/2024 EXAM: CTA HEAD AND NECK WITH AND WITHOUT 04/02/2024 09:07:20 PM TECHNIQUE: CTA of the head and neck was performed with and without the administration of intravenous contrast. Multiplanar 2D and/or 3D reformatted images are provided for review. Automated exposure control, iterative reconstruction, and/or weight based adjustment of the mA/kV was utilized to reduce the radiation dose to as low as reasonably achievable. Stenosis of the internal carotid arteries measured using NASCET criteria. COMPARISON: CTA head/neck from 01/09/2023 CLINICAL HISTORY: Stroke/TIA, determine embolic source FINDINGS: AORTIC ARCH AND ARCH VESSELS: No dissection or arterial injury. No significant stenosis of the brachiocephalic or subclavian arteries. CERVICAL CAROTID ARTERIES: No dissection, arterial injury, or hemodynamically significant stenosis by NASCET criteria. CERVICAL VERTEBRAL ARTERIES: No dissection, arterial  injury, or significant stenosis. LUNGS AND MEDIASTINUM: Unremarkable. SOFT TISSUES: No acute abnormality. BONES: No acute abnormality. ANTERIOR CIRCULATION: No significant stenosis of the internal carotid arteries. No significant stenosis of the anterior cerebral arteries. No significant stenosis of the middle cerebral arteries. No aneurysm. POSTERIOR CIRCULATION: No significant stenosis of the posterior cerebral arteries. No significant stenosis of the basilar artery. No significant stenosis of the vertebral arteries. No aneurysm. OTHER: No dural venous sinus thrombosis on this non-dedicated study. IMPRESSION: 1. No large vessel occlusion or hemodynamically significant stenosis. Electronically signed by: Gilmore Molt MD 04/02/2024 10:27 PM EST RP Workstation: [id number]   CT Knee Left Wo Contrast Result Date: 04/02/2024 CLINICAL DATA:  Fall.  Pain. EXAM: CT OF THE LEFT KNEE WITHOUT CONTRAST TECHNIQUE:  Multidetector CT imaging of the left knee was performed according to the standard protocol. Multiplanar CT image reconstructions were also generated. RADIATION DOSE REDUCTION: This exam was performed according to the departmental dose-optimization program which includes automated exposure control, adjustment of the mA and/or kV according to patient size and/or use of iterative reconstruction technique. COMPARISON:  Left knee radiograph dated 01/02/2024. FINDINGS: Bones/Joint/Cartilage No acute fracture or dislocation. Moderate-to-large joint effusion. No evidence of lipohemarthrosis. Chondrocalcinosis in the medial and lateral femorotibial compartments. Tricompartmental osteoarthritis with mild joint space narrowing and osteophytosis. Ligaments Ligaments are suboptimally evaluated by CT. Muscles and Tendons A Baker's cyst is noted. Quadriceps and patellar tendons appear intact. Soft tissue No loculated fluid collection. Peripheral vascular calcifications are noted. IMPRESSION: 1. No acute fracture or dislocation. 2. Moderate-to-large knee joint effusion. No evidence of lipohemarthrosis. 3. Tricompartmental osteoarthritis chondrocalcinosis in the medial and lateral compartments. Electronically Signed   By: Harrietta Sherry M.D.   On: 04/02/2024 13:39   MR BRAIN WO CONTRAST Result Date: 04/02/2024 CLINICAL DATA:  Syncope/presyncope EXAM: MRI HEAD WITHOUT CONTRAST TECHNIQUE: Multiplanar, multiecho pulse sequences of the brain and surrounding structures were obtained without intravenous contrast. COMPARISON:  August 02, 2023 FINDINGS: MRI brain: There is a punctate focus of restricted diffusion in the left frontal white matter and there are 2 punctate foci of restricted diffusion in the right frontal white matter. There are extensive and confluent foci of T2 hyperintensity in the cerebral white matter. These do not have restricted diffusion. The ventricles are normal. No mass lesion. There are normal flow signals in  the carotid arteries and basilar artery. No significant bone marrow signal abnormality. No significant abnormality in the paranasal sinuses or soft tissues. IMPRESSION: 1. Punctate acute lacunar infarct in the left frontal white matter and 2 punctate acute lacunar infarcts in the right frontal white matter 2. Extensive chronic white matter disease Electronically Signed   By: Nancyann Burns M.D.   On: 04/02/2024 13:15   DG Chest 2 View Result Date: 04/02/2024 EXAM: 2 VIEW(S) XRAY OF THE CHEST 04/02/2024 08:03:00 AM COMPARISON: 04/03/2023 CLINICAL HISTORY: dizziness FINDINGS: LUNGS AND PLEURA: No focal pulmonary opacity. No pleural effusion. No pneumothorax. HEART AND MEDIASTINUM: Mild cardiomegaly. Aortic valve prosthesis noted. CABG noted. Left chest single lead pacemaker noted. Aortic arch calcifications. BONES AND SOFT TISSUES: Sternotomy wires noted. Thoracic spondylosis. No acute osseous abnormality. IMPRESSION: 1. No acute findings. 2. Mild cardiomegaly with aortic valve prosthesis, CABG, and left chest single lead pacemaker. Electronically signed by: Evalene Coho MD 04/02/2024 08:30 AM EST RP Workstation: [id number]   DG Hip Unilat W or Wo Pelvis 2-3 Views Left Result Date: 04/02/2024 CLINICAL DATA:  Status post fall EXAM: DG HIP (WITH OR WITHOUT PELVIS) 3V LEFT COMPARISON:  None Available. FINDINGS: There is no evidence of hip fracture or dislocation. Mild degenerative changes of the hips. IMPRESSION: No acute fracture or dislocation. Mild degenerative changes of the hips. Electronically Signed   By: Limin  Xu M.D.   On: 04/02/2024 08:29   DG Knee Complete 4 Views Left Result Date: 04/02/2024 EXAM: 4 OR MORE VIEW(S) XRAY OF THE LEFT KNEE 04/02/2024 08:03:00 AM COMPARISON: None available. CLINICAL HISTORY: fall FINDINGS: BONES AND JOINTS: No acute fracture. No malalignment. There is tricompartmental osteoarthritis. There are calcifications present within the medial meniscus. There is a moderate  suprapatellar bursal effusion. SOFT TISSUES: There are moderate atheromatous calcifications present. IMPRESSION: 1. 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 2. Moderate suprapatellar bursal effusion. Electronically signed by: Evalene Coho MD 04/02/2024 08:29 AM EST RP Workstation: [id number]   CT HEAD WO CONTRAST ( ) Result Date: 04/02/2024 EXAM: CT HEAD WITHOUT CONTRAST 04/02/2024 07:29:16 AM TECHNIQUE: CT of the head was performed without the administration of intravenous contrast. Automated exposure control, iterative reconstruction, and/or weight based adjustment of the mA/kV was utilized to reduce the radiation dose to as low as reasonably achievable. COMPARISON: CT of the head dated 01/09/2023. CLINICAL HISTORY: Syncope/presyncope, cerebrovascular cause suspected. FINDINGS: BRAIN AND VENTRICLES: No acute hemorrhage. No evidence of acute infarct. No hydrocephalus. No extra-axial collection. No mass effect or midline shift. There is age-related atrophy and moderate diffuse cerebral white matter disease. There are senescent calcifications within the basal ganglia bilaterally. There are atheromatous calcifications within the carotid siphons and vertebral arteries. ORBITS: No acute abnormality. The patient is status post bilateral lens replacement. SINUSES: No acute abnormality. SOFT TISSUES AND SKULL: No acute soft tissue abnormality. No skull fracture. IMPRESSION: 1. No acute intracranial abnormality. 2. Age-related atrophy and moderate diffuse cerebral white matter disease. Electronically signed by: Evalene Coho MD 04/02/2024 07:32 AM EST RP Workstation: [id number]   CUP PACEART REMOTE DEVICE CHECK Result Date: 04/01/2024 PPM Scheduled remote reviewed. Normal device function.  Presenting rhythm: VP, PVCs. Next remote transmission per protocol. - CS, CVRS   Assessment/Plan 1. History of CVA (cerebrovascular accident) (Primary) - MRI noted acute lacunar  infarct in left and right frontal white matter - no weakness on exam, appears appropriate - h/o CVA 12/2022 - cont Eliquis    2. Closed fracture of left tibial plateau with routine healing, subsequent encounter - noted on MRI left knee  - orthopedics did not recommend surgery - arthrocentesis also performed due to swelling  - WBAT - having increased pain - will start Percocet instead oxycodone   - cont PT/OT  3. Complete heart block (HCC) - s/p PPM 03/2023  4. S/P TAVR (transcatheter aortic valve replacement)  5. Primary hypertension - controlled HCTZ  6. Coronary artery disease involving autologous vein coronary bypass graft without angina pectoris - cont statin   7. Reactive depression - wife passed this year/ recent hospitalization with fracture as cause - no plan to harm self  - PHQ score 5 - will try zoloft  to improve mood and motivation - bmp in 2 weeks   8. Skin tear of right elbow without complication, subsequent encounter - mechanical fall 12/09 - no infection  - cont wound care     Family/ staff Communication: plan discussed with patient, son and nurse  Labs/tests ordered:  bmp in 2 weeks         [1]  Allergies Allergen Reactions   Hydralazine  Hcl Other (See Comments)    Had severe headache all night.  Nightmares.  Drugged feeling.  Extreme dizziness   Ace Inhibitors Other (See Comments)    COUGH  Product containing angiotensin-converting enzyme inhibitor (product)   Zocor [Simvastatin] Other (See Comments)    MYALGIA   Nsaids Other (See Comments)    Would avoid given anticoagulation   Prednisone  Other (See Comments)    Prednisone  caused pt to have elevated BP.   Cephalexin Rash   Chocolate Other (See Comments)    Headaches    Doxycycline  Other (See Comments)    Nausea and abd pain   Gabapentin  Other (See Comments)    Dizziness   Hycodan [Hydrocodone  Bit-Homatrop Mbr] Nausea And Vomiting   Oxycodone  Nausea Only    Pt states that he  cannot take prescription pain medication

## 2024-04-09 MED ORDER — SERTRALINE HCL 25 MG PO TABS: 25.0000 mg | ORAL_TABLET | Freq: Every day | ORAL | Status: DC

## 2024-04-10 ENCOUNTER — Non-Acute Institutional Stay (SKILLED_NURSING_FACILITY): Payer: Self-pay | Admitting: Internal Medicine

## 2024-04-10 ENCOUNTER — Encounter: Payer: Self-pay | Admitting: Internal Medicine

## 2024-04-10 DIAGNOSIS — R5381 Other malaise: Secondary | ICD-10-CM | POA: Diagnosis not present

## 2024-04-10 DIAGNOSIS — I10 Essential (primary) hypertension: Secondary | ICD-10-CM | POA: Diagnosis not present

## 2024-04-10 DIAGNOSIS — S82122A Displaced fracture of lateral condyle of left tibia, initial encounter for closed fracture: Secondary | ICD-10-CM | POA: Insufficient documentation

## 2024-04-10 DIAGNOSIS — E782 Mixed hyperlipidemia: Secondary | ICD-10-CM | POA: Diagnosis not present

## 2024-04-10 DIAGNOSIS — Z7901 Long term (current) use of anticoagulants: Secondary | ICD-10-CM

## 2024-04-10 DIAGNOSIS — Z952 Presence of prosthetic heart valve: Secondary | ICD-10-CM | POA: Diagnosis not present

## 2024-04-10 DIAGNOSIS — S82122S Displaced fracture of lateral condyle of left tibia, sequela: Secondary | ICD-10-CM | POA: Diagnosis not present

## 2024-04-10 DIAGNOSIS — I4821 Permanent atrial fibrillation: Secondary | ICD-10-CM | POA: Diagnosis not present

## 2024-04-10 NOTE — Progress Notes (Unsigned)
 [facility] SNF Admission H&P  Provider: Camellia Door, DO Location:  Other [place].  Nursing Home Room Number: [facility] [id number] Place of Service:  SNF ([phone])   PCP: Cleatus Arlyss RAMAN, MD Patient Care Team: Cleatus Arlyss RAMAN, MD as PCP - General (Family Medicine) Inocencio Soyla Lunger, MD as PCP - Electrophysiology (Cardiology) Jeffrie Oneil BROCKS, MD as PCP - Cardiology (Cardiology) Fate Morna SAILOR, [facility] (Inactive) as Pharmacist (Pharmacist) [facility]   Extended Emergency Contact Information Primary Emergency Contact: [facility] Mobile Phone: [phone] Relation: Son Secondary Emergency Contact: [name],[name] Mobile Phone: [phone] Relation: Son   Goals of Care: Advanced Directive information    04/04/2024   11:37 AM  Advanced Directives  Does Patient Have a Medical Advance Directive? Yes  Type of Advance Directive Living will  Does patient want to make changes to medical advance directive? No - Patient declined    CODE STATUS: Do Not Resuscitate (DNR)    Chief Complaint  Patient presents with   Admission    Admission.      HPI: Patient is a 89 y.o. male seen today for admission to [facility].  [name] [name] is a 89 year old male with a history of chronic atrial fibrillation on Eliquis , history of hypertension, history of recurrent strokes, hyperlipidemia, reflux, history of TAVR, history of pacemaker placement, who was admitted to [facility] on 04/02/2024 for an acute stroke.  He was discharged on 04/05/2024.  During his hospitalization, he was seen by neurology for his stroke.  He had a left painful knee.  He also had a left knee effusion.  MRI of the left knee was performed which showed a left lateral subchondral tibial plateau fracture with a large joint effusion.  He had a suprapatellar bursa aspiration performed.  This was bloody.  Echocardiogram performed during his hospitalization showed LVEF of 40 to 45%.   He has a mild perivalvular leak.  He has a 29 mm SAPIEN prosthetic TAVR valve in the aortic position.  His bubble study was negative for interatrial shunting.  He is seen today for admission to [facility].  His son Camellia is at the bedside.  Patient himself is in good spirits.  He still having lots of pain in his left knee with walking.  He normally uses a rollator at home.  During this visit today, we discussed that he likely needs to be heel touch weightbearing instead of weightbearing as tolerated in order to get his subchondral tibial plateau fracture to heal.  He was seen by physical therapy and felt to need acute rehab.  He was discharged to [facility] to complete rehab.  Patient and son do not have any other health concerns.  He remains on Eliquis  for his atrial fibrillation and systemic anticoagulation.  He is also on HCTZ for his hypertension.  Is on pravastatin  80 mg for hyperlipidemia.  He is on Zoloft  25 mg for his depression.   This is a comprehensive admission note to this SNF performed on this date less than 30 days from date of admission. Included are preadmission medical/surgical history; reconciled medication list; family history; social history and comprehensive review of systems.  Corrections and additions to the records were documented. Comprehensive physical exam was also performed. Additionally a clinical summary was entered for each active diagnosis pertinent to this admission in the Problem List to enhance continuity of care.   Past Medical History:  Diagnosis Date   Acute CVA (cerebrovascular accident) (HCC) 04/02/2024  Arthritis    Atrial fibrillation, permanent (HCC)    Bladder neoplasm    Chronic cough    NO CARDIAC OR PULMONARY RELATED   Coronary artery disease CARDIOLOGIST-  DR KLEIN/ [facility]   a. s/p CABG 2004, b. LHC with patent grafts 07/2005, ejection fraction 50%;  c. Myoview 2/15: no ischemia, EF 61%   Cough 04/29/2009   Followed in Pulmonary clinic/  [facility]/ [facility]   - Trial off cozaar  07/20/2012 >>>        Dry eyes    Dyslipidemia    Family history of colon cancer 10/31/2016   GERD (gastroesophageal reflux disease)    Hematuria 07/27/2020   Oct 13, 2003 Entered By: MARSA BOUCHARD Comment: s/p cystoscopy Willma Beat 2005, normal     History of adenomatous polyp of colon 10/31/2016   History of CVA (cerebrovascular accident) 03/10/2020   Stroke in the Left eye, and brain stem   History of CVA in adulthood 01/23/2023   HTN (hypertension)    PONV (postoperative nausea and vomiting)    From having surgery back in the 1950's   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 Amiodarone  Rx   S/P CABG x 5 2004   S/P TAVR (transcatheter aortic valve replacement) 11/15/2022   s/p TAVR with a 29mm Edwards S3UR via the TF approach by Dr. Wonda & Maryjane.   Severe aortic stenosis    Shingles 12/08/2023   Past Surgical History:  Procedure Laterality Date   CARDIAC CATHETERIZATION  07-26-2005  DR GAMBLE   PRESERVED LVF/  EF 50%/ PATENT GRAFTS   CARDIAC CATHETERIZATION  03-04-2003  DR GAMBLE   SEVERE 3 VESSEL DISEASE   CARDIAC CATHETERIZATION  1991  DR [facility]   PAF/ FALSE POSITIVE STRESS TEST   CAROTID ENDARTERECTOMY Left 2022   CATARACT EXTRACTION W/ INTRAOCULAR LENS IMPLANT Right    CORONARY ARTERY BYPASS GRAFT  03-08-2003  DR HZMYJMIU   LIMA TO LAD/ SVG TO OM2/  SVG TO DIAGONAL / SVG TO PDA & PLA   CYSTOSCOPY WITH BIOPSY N/A 12/25/2012   Procedure: CYSTOSCOPY WITH BLADDER BIOPSY;  Surgeon: Donnice Gwenyth Brooks, MD;  Location: [facility];  Service: Urology;  Laterality: N/A;   ELECTROPHYSIOLOGY STUDY  07-27-2005  DR DANELLE B[place]ING[place]   MAPPING --   RESULT NONINDUCIBLE VT OR SVT/  DX RIGHT VENTRICULAR OUTFLOW TRACT VT AND RULES OUT MORE MALIGNANT CAUSES OF VT   ENDARTERECTOMY Left 07/08/2020   Procedure: LEFT CAROTID ENDARTERECTOMY;  Surgeon: Serene Gaile ORN, MD;  Location: [facility]  OR;  Service: Vascular;  Laterality: Left;   INGUINAL HERNIA REPAIR Bilateral 1954  &  1990   INTRAOPERATIVE TRANSTHORACIC ECHOCARDIOGRAM N/A 11/15/2022   Procedure: INTRAOPERATIVE TRANSTHORACIC ECHOCARDIOGRAM;  Surgeon: Wonda Sharper, MD;  Location: [facility] INVASIVE CV LAB;  Service: Open Heart Surgery;  Laterality: N/A;   KNEE ARTHROSCOPY Right 1994   LEFT HEART CATH AND CORS/GRAFTS ANGIOGRAPHY N/A 10/14/2022   Procedure: LEFT HEART CATH AND CORS/GRAFTS ANGIOGRAPHY;  Surgeon: Wonda Sharper, MD;  Location: [facility] INVASIVE CV LAB;  Service: Cardiovascular;  Laterality: N/A;   LUMBAR DISC SURGERY  1999   L4 -- L5   MOHS SURGERY     by Dr. Lyle 2019   PACEMAKER IMPLANT N/A 04/03/2023   Procedure: PACEMAKER IMPLANT;  Surgeon: Fernande Elspeth BROCKS, MD;  Location: [facility] INVASIVE CV LAB;  Service: Cardiovascular;  Laterality: N/A;   REMOVAL VOCAL CORD POLYPS  1978   TONSILLECTOMY  AS CHILD  TRANSCATHETER AORTIC VALVE REPLACEMENT, TRANSFEMORAL N/A 11/15/2022   Procedure: Transcatheter Aortic Valve Replacement, Transfemoral;  Surgeon: Wonda Sharper, MD;  Location: [facility] INVASIVE CV LAB;  Service: Open Heart Surgery;  Laterality: N/A;   TRANSTHORACIC ECHOCARDIOGRAM  08/08/2011   MILD LVH/  EF 55-60%/  GRADE I DIASTOLIC DYSFUNCTION/ MILD AV STENOSIS    reports that he quit smoking about 43 years ago. His smoking use included cigarettes. He started smoking about 69 years ago. He has a 25 pack-year smoking history. He has never used smokeless tobacco. He reports that he does not drink alcohol and does not use drugs. Social History   Socioeconomic History   Marital status: Widowed    Spouse name: [name]   Number of children: 3   Years of education: Not on file   Highest education level: Not on file  Occupational History   Occupation: Retired  Tobacco Use   Smoking status: Former    Current packs/day: 0.00    Average packs/day: 1 pack/day for 25.0 years (25.0 ttl pk-yrs)    Types: Cigarettes    Start date:  04/26/1955    Quit date: 04/25/1980    Years since quitting: 43.9   Smokeless tobacco: Never  Vaping Use   Vaping status: Never Used  Substance and Sexual Activity   Alcohol use: No    Alcohol/week: 0.0 standard drinks of alcohol   Drug use: No   Sexual activity: Not on file  Other Topics Concern   Not on file  Social History Narrative   [employer]-'[employer], overseas to [place]   Some care through [place]   No service disability   Retired    Widowed 2025, was prev married 60+ years   Social Drivers of Health   Tobacco Use: Medium Risk (04/10/2024)   Patient History    Smoking Tobacco Use: Former    Smokeless Tobacco Use: Never    Passive Exposure: Not on Actuary Strain: Low Risk (02/13/2023)   Overall Financial Resource Strain (CARDIA)    Difficulty of Paying Living Expenses: Not hard at all  Food Insecurity: No Food Insecurity (04/04/2024)   Epic    Worried About Radiation Protection Practitioner of Food in the Last Year: Never true    Ran Out of Food in the Last Year: Never true  Transportation Needs: No Transportation Needs (04/04/2024)   Epic    Lack of Transportation (Medical): No    Lack of Transportation (Non-Medical): No  Physical Activity: Insufficiently Active (02/13/2023)   Exercise Vital Sign    Days of Exercise per Week: 2 days    Minutes of Exercise per Session: 40 min  Stress: No Stress Concern Present (02/13/2023)   [employer] of Occupational Health - Occupational Stress Questionnaire    Feeling of Stress : Not at all  Social Connections: Socially Isolated (04/04/2024)   Social Connection and Isolation Panel    Frequency of Communication with Friends and Family: More than three times a week    Frequency of Social Gatherings with Friends and Family: Once a week    Attends Religious Services: Never    Database Administrator or Organizations: No    Attends Banker Meetings: Never    Marital Status: Widowed  Intimate Partner Violence: Not At Risk  (04/04/2024)   Epic    Fear of Current or Ex-Partner: No    Emotionally Abused: No    Physically Abused: No    Sexually Abused: No  Depression (PHQ2-9): Medium Risk (04/09/2024)  Depression (PHQ2-9)    PHQ-2 Score: 5  Alcohol Screen: Low Risk (02/13/2023)   Alcohol Screen    Last Alcohol Screening Score (AUDIT): 0  Housing: Unknown (04/04/2024)   Epic    Unable to Pay for Housing in the Last Year: No    Number of Times Moved in the Last Year: Not on file    Homeless in the Last Year: No  Utilities: Not At Risk (04/04/2024)   Epic    Threatened with loss of utilities: No  Health Literacy: Adequate Health Literacy (02/13/2023)   B1300 Health Literacy    Frequency of need for help with medical instructions: Never     Functional Status Survey:     Family History  Problem Relation Age of Onset   Cancer Mother    Stroke Neg Hx      Health Maintenance  Topic Date Due   Colonoscopy  05/30/2023   Medicare Annual Wellness (AWV)  02/13/2024   Influenza Vaccine  07/23/2024 (Originally 11/24/2023)   COVID-19 Vaccine (5 - 2025-26 season) 02/08/2028 (Originally 12/25/2023)   DTaP/Tdap/Td (8 - Td or Tdap) 10/26/2031   Pneumococcal Vaccine: 50+ Years  Completed   Zoster Vaccines- Shingrix  Completed   Meningococcal B Vaccine  Aged Out     Allergies[1]   Outpatient Encounter Medications as of 04/10/2024  Medication Sig   acetaminophen  (TYLENOL ) 500 MG tablet Take 500-1,000 mg by mouth every 6 (six) hours as needed (pain.).   apixaban  (ELIQUIS ) 5 MG TABS tablet Take 1 tablet (5 mg total) by mouth 2 (two) times daily. Take evening dose starting 07/09/20   butalbital -acetaminophen -caffeine  (FIORICET ) 50-325-40 MG tablet Take 1 tablet by mouth 3 (three) times daily as needed for headache.   cetirizine  (ZYRTEC ) 10 MG tablet Take 1 tablet (10 mg total) by mouth daily.   hydrochlorothiazide  (HYDRODIURIL ) 25 MG tablet Take 1 tablet (25 mg total) by mouth daily.   Iron , Ferrous Sulfate , 325  (65 Fe) MG TABS Take 325 mg by mouth daily.   latanoprost  (XALATAN ) 0.005 % ophthalmic solution Place 1 drop into both eyes at bedtime.   oxyCODONE -acetaminophen  (PERCOCET/ROXICET) 5-325 MG tablet Take 1 tablet by mouth every 6 (six) hours as needed for up to 8 days for severe pain (pain score 7-10).   Polyethyl Glycol-Propyl Glycol (SYSTANE ULTRA OP) Place 1-2 drops into both eyes once as needed (dry eyes.).   polyethylene glycol (MIRALAX ) 17 g packet Take 17 g by mouth daily.   potassium chloride  (KLOR-CON  M) 10 MEQ tablet Take 10 mEq by mouth in the morning.   pravastatin  (PRAVACHOL ) 80 MG tablet Take 1 tablet (80 mg total) by mouth every evening.   senna-docusate (SENOKOT-S) 8.6-50 MG tablet Take 1 tablet by mouth at bedtime as needed for mild constipation or moderate constipation.   sertraline  (ZOLOFT ) 25 MG tablet Take 1 tablet (25 mg total) by mouth daily.   No facility-administered encounter medications on file as of 04/10/2024.     Review of Systems  Constitutional: Negative.   HENT: Negative.    Eyes: Negative.   Respiratory: Negative.    Cardiovascular: Negative.   Endocrine: Negative.   Genitourinary: Negative.   Musculoskeletal:  Positive for joint swelling.       Left knee pain.  Skin: Negative.   Allergic/Immunologic: Negative.   Neurological: Negative.   Hematological: Negative.   Psychiatric/Behavioral: Negative.    All other systems reviewed and are negative.    Vitals:   04/10/24 0831  BP: (!) 154/78  Pulse: 82  Resp: 18  Temp: 98 F (36.7 C)  SpO2: 96%  Weight: 167 lb (75.8 kg)  Height: 5' 10 (1.778 m)   Body mass index is 23.96 kg/m. Physical Exam Vitals and nursing note reviewed.  Constitutional:      General: He is not in acute distress.    Appearance: He is normal weight. He is not toxic-appearing or diaphoretic.  HENT:     Head: Normocephalic and atraumatic.     Nose: Nose normal.  Cardiovascular:     Rate and Rhythm: Normal rate. Rhythm  irregular.     Pulses: Normal pulses.     Heart sounds: Murmur heard.     Comments: SEM 2/6 Pulmonary:     Effort: Pulmonary effort is normal. No respiratory distress.     Breath sounds: Normal breath sounds. No wheezing.  Abdominal:     General: Abdomen is flat. Bowel sounds are normal.     Palpations: Abdomen is soft.  Musculoskeletal:       Legs:  Skin:    General: Skin is warm and dry.     Capillary Refill: Capillary refill takes less than 2 seconds.  Neurological:     General: No focal deficit present.     Mental Status: He is alert and oriented to person, place, and time.      Labs reviewed: Basic Metabolic Panel: Recent Labs    01/01/24 0936 04/02/24 0845 04/03/24 0114  NA 139 140 138  K 3.4* 3.3* 3.7  CL 99 101 102  CO2 27 29 26   GLUCOSE 104* 99 148*  BUN 14 14 14   CREATININE 0.88 0.77 1.03  CALCIUM  9.2 9.3 8.7*  MG  --  2.1  --    Liver Function Tests: Recent Labs    12/28/23 0947 04/02/24 0845 04/03/24 0114  AST 25 28 40  ALT 13 15 16   ALKPHOS 63 62 53  BILITOT 0.7 1.6* 1.9*  PROT 7.2 7.1 7.0  ALBUMIN 4.0 3.9 3.7    CBC: Recent Labs    04/18/23 1120 12/28/23 0947 01/01/24 0936 04/02/24 0845 04/03/24 0114 04/05/24 0236  WBC 5.1 4.6   < > 7.5 10.6* 8.6  NEUTROABS 3.2 2.7  --   --   --   --   HGB 10.9* 12.8*   < > 14.5 14.1 12.6*  HCT 32.5* 38.3*   < > 42.5 42.3 36.8*  MCV 97.1 96.5   < > 99.3 98.1 98.9  PLT 191.0 153.0   < > 135* 147* 146*   < > = values in this interval not displayed.    BNP: Invalid input(s): POCBNP Lab Results  Component Value Date   HGBA1C 5.2 04/03/2024   Lab Results  Component Value Date   TSH 1.89 12/28/2023   Lab Results  Component Value Date   VITAMINB12 292 04/18/2023   No results found for: FOLATE Lab Results  Component Value Date   IRON  114 09/28/2021   TIBC 322.0 09/28/2021   FERRITIN 64.5 04/18/2023     Imaging and Procedures obtained prior to SNF admission: ECHOCARDIOGRAM COMPLETE  BUBBLE STUDY Result Date: 04/03/2024    ECHOCARDIOGRAM REPORT   Patient Name:   [name] [name] Date of Exam: 04/03/2024 Medical Rec #:  [id number]           Height:       70.0 in Accession #:    [id number]          Weight:  169.8 lb Date of Birth:  12/18/1934           BSA:          1.947 m Patient Age:    89 years            BP:           150/74 mmHg Patient Gender: M                   HR:           58 bpm. Exam Location:  Inpatient Procedure: 2D Echo, Cardiac Doppler, Color Doppler and Saline Contrast Bubble            Study (Both Spectral and Color Flow Doppler were utilized during            procedure). Indications:    Stroke  History:        Patient has prior history of Echocardiogram examinations, most                 recent 10/26/2023. CAD, Arrythmias:Atrial Fibrillation; Risk                 Factors:Hypertension, Dyslipidemia and Former Smoker.                 Aortic Valve: 29 mm Sapien prosthetic, stented (TAVR) valve is                 present in the aortic position. Procedure Date: 11/15/2022.  Sonographer:    Merlynn Argyle Referring Phys: [id number] MARSA NOVAK MELVIN  Sonographer Comments: Image acquisition challenging due to respiratory motion. IMPRESSIONS  1. Left ventricular ejection fraction, by estimation, is 45 to 50%. The left ventricle has mildly decreased function. The left ventricle demonstrates regional wall motion abnormalities (see scoring diagram/findings for description). The left ventricular  internal cavity size was mildly dilated. There is mild concentric left ventricular hypertrophy. Left ventricular diastolic parameters are indeterminate.  2. Right ventricular systolic function is normal. The right ventricular size is mildly enlarged. There is mildly elevated pulmonary artery systolic pressure. The estimated right ventricular systolic pressure is 37.4 mmHg.  3. Left atrial size was severely dilated.  4. The mitral valve is degenerative. Mild mitral valve regurgitation. No evidence  of mitral stenosis. Severe mitral annular calcification.  5. Mild perivalvular leak. The aortic valve has been repaired/replaced. Aortic valve regurgitation is mild. No aortic stenosis is present. There is a 29 mm Sapien prosthetic (TAVR) valve present in the aortic position. Procedure Date: 11/15/2022. Echo findings are consistent with perivalvular leak of the aortic prosthesis.  6. Aortic dilatation noted. There is mild dilatation of the ascending aorta, measuring 42 mm.  7. The inferior vena cava is normal in size with greater than 50% respiratory variability, suggesting right atrial pressure of 3 mmHg.  8. Agitated saline contrast bubble study was negative, with no evidence of any interatrial shunt. Conclusion(s)/Recommendation(s): No intracardiac source of embolism detected on this transthoracic study. Consider a transesophageal echocardiogram to exclude cardiac source of embolism if clinically indicated. FINDINGS  Left Ventricle: Left ventricular ejection fraction, by estimation, is 45 to 50%. The left ventricle has mildly decreased function. The left ventricle demonstrates regional wall motion abnormalities. The left ventricular internal cavity size was mildly dilated. There is mild concentric left ventricular hypertrophy. Abnormal (paradoxical) septal motion, consistent with left bundle branch block. Left ventricular diastolic parameters are indeterminate.  LV Wall Scoring: The mid inferoseptal segment is hypokinetic. Right Ventricle: The right  ventricular size is mildly enlarged. No increase in right ventricular wall thickness. Right ventricular systolic function is normal. There is mildly elevated pulmonary artery systolic pressure. The tricuspid regurgitant velocity is 2.71 m/s, and with an assumed right atrial pressure of 8 mmHg, the estimated right ventricular systolic pressure is 37.4 mmHg. Left Atrium: Left atrial size was severely dilated. Right Atrium: Right atrial size was normal in size.  Pericardium: There is no evidence of pericardial effusion. Mitral Valve: The mitral valve is degenerative in appearance. Severe mitral annular calcification. Mild mitral valve regurgitation. No evidence of mitral valve stenosis. MV peak gradient, 5.0 mmHg. The mean mitral valve gradient is 2.0 mmHg. Tricuspid Valve: The tricuspid valve is normal in structure. Tricuspid valve regurgitation is mild . No evidence of tricuspid stenosis. Aortic Valve: Mild perivalvular leak. The aortic valve has been repaired/replaced. Aortic valve regurgitation is mild. No aortic stenosis is present. Aortic valve mean gradient measures 8.0 mmHg. Aortic valve peak gradient measures 16.5 mmHg. Aortic valve area, by VTI measures 1.87 cm. There is a 29 mm Sapien prosthetic, stented (TAVR) valve present in the aortic position. Procedure Date: 11/15/2022. Pulmonic Valve: The pulmonic valve was normal in structure. Pulmonic valve regurgitation is mild to moderate. No evidence of pulmonic stenosis. Aorta: Aortic dilatation noted. There is mild dilatation of the ascending aorta, measuring 42 mm. Venous: The inferior vena cava is normal in size with greater than 50% respiratory variability, suggesting right atrial pressure of 3 mmHg. IAS/Shunts: No atrial level shunt detected by color flow Doppler. Agitated saline contrast was given intravenously to evaluate for intracardiac shunting. Agitated saline contrast bubble study was negative, with no evidence of any interatrial shunt. Additional Comments: A device lead is visualized in the right ventricle.  LEFT VENTRICLE PLAX 2D LVIDd:         5.70 cm   Diastology LVIDs:         3.90 cm   LV e' medial: 6.98 cm/s LV PW:         1.20 cm LV IVS:        1.20 cm LVOT diam:     2.20 cm LV SV:         70 LV SV Index:   36 LVOT Area:     3.80 cm  RIGHT VENTRICLE             IVC RV Basal diam:  4.30 cm     IVC diam: 2.30 cm RV Mid diam:    3.40 cm RV S prime:     11.60 cm/s TAPSE (M-mode): 1.5 cm LEFT ATRIUM               Index        RIGHT ATRIUM           Index LA diam:        4.10 cm  2.11 cm/m   RA Area:     21.90 cm LA Vol (A2C):   121.0 ml 62.15 ml/m  RA Volume:   60.20 ml  30.92 ml/m LA Vol (A4C):   62.2 ml  31.95 ml/m LA Biplane Vol: 88.3 ml  45.36 ml/m  AORTIC VALVE AV Area (Vmax):    1.74 cm AV Area (Vmean):   1.89 cm AV Area (VTI):     1.87 cm AV Vmax:           203.25 cm/s AV Vmean:          130.500 cm/s AV VTI:  0.372 m AV Peak Grad:      16.5 mmHg AV Mean Grad:      8.0 mmHg LVOT Vmax:         93.00 cm/s LVOT Vmean:        64.950 cm/s LVOT VTI:          0.183 m LVOT/AV VTI ratio: 0.49  AORTA Ao Root diam: 3.90 cm Ao Asc diam:  4.20 cm MITRAL VALVE             TRICUSPID VALVE MV Area VTI:  2.45 cm   TR Peak grad:   29.4 mmHg MV Peak grad: 5.0 mmHg   TR Vmax:        271.00 cm/s MV Mean grad: 2.0 mmHg MV Vmax:      1.12 m/s   SHUNTS MV Vmean:     59.6 cm/s  Systemic VTI:  0.18 m                          Systemic Diam: 2.20 cm Oneil Parchment MD Electronically signed by Oneil Parchment MD Signature Date/Time: 04/03/2024/3:34:13 PM    Final    CT ANGIO HEAD NECK W WO CM Result Date: 04/02/2024 EXAM: CTA HEAD AND NECK WITH AND WITHOUT 04/02/2024 09:07:20 PM TECHNIQUE: CTA of the head and neck was performed with and without the administration of intravenous contrast. Multiplanar 2D and/or 3D reformatted images are provided for review. Automated exposure control, iterative reconstruction, and/or weight based adjustment of the mA/kV was utilized to reduce the radiation dose to as low as reasonably achievable. Stenosis of the internal carotid arteries measured using NASCET criteria. COMPARISON: CTA head/neck from 01/09/2023 CLINICAL HISTORY: Stroke/TIA, determine embolic source FINDINGS: AORTIC ARCH AND ARCH VESSELS: No dissection or arterial injury. No significant stenosis of the brachiocephalic or subclavian arteries. CERVICAL CAROTID ARTERIES: No dissection, arterial injury, or hemodynamically  significant stenosis by NASCET criteria. CERVICAL VERTEBRAL ARTERIES: No dissection, arterial injury, or significant stenosis. LUNGS AND MEDIASTINUM: Unremarkable. SOFT TISSUES: No acute abnormality. BONES: No acute abnormality. ANTERIOR CIRCULATION: No significant stenosis of the internal carotid arteries. No significant stenosis of the anterior cerebral arteries. No significant stenosis of the middle cerebral arteries. No aneurysm. POSTERIOR CIRCULATION: No significant stenosis of the posterior cerebral arteries. No significant stenosis of the basilar artery. No significant stenosis of the vertebral arteries. No aneurysm. OTHER: No dural venous sinus thrombosis on this non-dedicated study. IMPRESSION: 1. No large vessel occlusion or hemodynamically significant stenosis. Electronically signed by: Gilmore Molt MD 04/02/2024 10:27 PM EST RP Workstation: [id number]   CT Knee Left Wo Contrast Result Date: 04/02/2024 CLINICAL DATA:  Fall.  Pain. EXAM: CT OF THE LEFT KNEE WITHOUT CONTRAST TECHNIQUE: Multidetector CT imaging of the left knee was performed according to the standard protocol. Multiplanar CT image reconstructions were also generated. RADIATION DOSE REDUCTION: This exam was performed according to the departmental dose-optimization program which includes automated exposure control, adjustment of the mA and/or kV according to patient size and/or use of iterative reconstruction technique. COMPARISON:  Left knee radiograph dated 01/02/2024. FINDINGS: Bones/Joint/Cartilage No acute fracture or dislocation. Moderate-to-large joint effusion. No evidence of lipohemarthrosis. Chondrocalcinosis in the medial and lateral femorotibial compartments. Tricompartmental osteoarthritis with mild joint space narrowing and osteophytosis. Ligaments Ligaments are suboptimally evaluated by CT. Muscles and Tendons A Baker's cyst is noted. Quadriceps and patellar tendons appear intact. Soft tissue No loculated fluid collection.  Peripheral vascular calcifications are noted. IMPRESSION: 1. No acute fracture or  dislocation. 2. Moderate-to-large knee joint effusion. No evidence of lipohemarthrosis. 3. Tricompartmental osteoarthritis chondrocalcinosis in the medial and lateral compartments. Electronically Signed   By: Harrietta Sherry M.D.   On: 04/02/2024 13:39   MR BRAIN WO CONTRAST Result Date: 04/02/2024 CLINICAL DATA:  Syncope/presyncope EXAM: MRI HEAD WITHOUT CONTRAST TECHNIQUE: Multiplanar, multiecho pulse sequences of the brain and surrounding structures were obtained without intravenous contrast. COMPARISON:  August 02, 2023 FINDINGS: MRI brain: There is a punctate focus of restricted diffusion in the left frontal white matter and there are 2 punctate foci of restricted diffusion in the right frontal white matter. There are extensive and confluent foci of T2 hyperintensity in the cerebral white matter. These do not have restricted diffusion. The ventricles are normal. No mass lesion. There are normal flow signals in the carotid arteries and basilar artery. No significant bone marrow signal abnormality. No significant abnormality in the paranasal sinuses or soft tissues. IMPRESSION: 1. Punctate acute lacunar infarct in the left frontal white matter and 2 punctate acute lacunar infarcts in the right frontal white matter 2. Extensive chronic white matter disease Electronically Signed   By: Nancyann Burns M.D.   On: 04/02/2024 13:15   DG Chest 2 View Result Date: 04/02/2024 EXAM: 2 VIEW(S) XRAY OF THE CHEST 04/02/2024 08:03:00 AM COMPARISON: 04/03/2023 CLINICAL HISTORY: dizziness FINDINGS: LUNGS AND PLEURA: No focal pulmonary opacity. No pleural effusion. No pneumothorax. HEART AND MEDIASTINUM: Mild cardiomegaly. Aortic valve prosthesis noted. CABG noted. Left chest single lead pacemaker noted. Aortic arch calcifications. BONES AND SOFT TISSUES: Sternotomy wires noted. Thoracic spondylosis. No acute osseous abnormality. IMPRESSION: 1. No  acute findings. 2. Mild cardiomegaly with aortic valve prosthesis, CABG, and left chest single lead pacemaker. Electronically signed by: Evalene Coho MD 04/02/2024 08:30 AM EST RP Workstation: [id number]   DG Hip Unilat W or Wo Pelvis 2-3 Views Left Result Date: 04/02/2024 CLINICAL DATA:  Status post fall EXAM: DG HIP (WITH OR WITHOUT PELVIS) 3V LEFT COMPARISON:  None Available. FINDINGS: There is no evidence of hip fracture or dislocation. Mild degenerative changes of the hips. IMPRESSION: No acute fracture or dislocation. Mild degenerative changes of the hips. Electronically Signed   By: Limin  Xu M.D.   On: 04/02/2024 08:29   DG Knee Complete 4 Views Left Result Date: 04/02/2024 EXAM: 4 OR MORE VIEW(S) XRAY OF THE LEFT KNEE 04/02/2024 08:03:00 AM COMPARISON: None available. CLINICAL HISTORY: fall FINDINGS: BONES AND JOINTS: No acute fracture. No malalignment. There is tricompartmental osteoarthritis. There are calcifications present within the medial meniscus. There is a moderate suprapatellar bursal effusion. SOFT TISSUES: There are moderate atheromatous calcifications present. IMPRESSION: 1. 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 2. Moderate suprapatellar bursal effusion. Electronically signed by: Evalene Coho MD 04/02/2024 08:29 AM EST RP Workstation: [id number]   CT HEAD WO CONTRAST ( ) Result Date: 04/02/2024 EXAM: CT HEAD WITHOUT CONTRAST 04/02/2024 07:29:16 AM TECHNIQUE: CT of the head was performed without the administration of intravenous contrast. Automated exposure control, iterative reconstruction, and/or weight based adjustment of the mA/kV was utilized to reduce the radiation dose to as low as reasonably achievable. COMPARISON: CT of the head dated 01/09/2023. CLINICAL HISTORY: Syncope/presyncope, cerebrovascular cause suspected. FINDINGS: BRAIN AND VENTRICLES: No acute hemorrhage. No evidence of acute infarct. No hydrocephalus. No extra-axial  collection. No mass effect or midline shift. There is age-related atrophy and moderate diffuse cerebral white matter disease. There are senescent calcifications within the basal ganglia bilaterally. There are atheromatous calcifications within the carotid siphons and vertebral  arteries. ORBITS: No acute abnormality. The patient is status post bilateral lens replacement. SINUSES: No acute abnormality. SOFT TISSUES AND SKULL: No acute soft tissue abnormality. No skull fracture. IMPRESSION: 1. No acute intracranial abnormality. 2. Age-related atrophy and moderate diffuse cerebral white matter disease. Electronically signed by: Evalene Coho MD 04/02/2024 07:32 AM EST RP Workstation: [id number]     Assessment/Plan Debility Continue with PT, OT.  Patient plans on returning home after discharge.  Closed fracture of lateral portion of left tibial plateau - subchondral impaction fracture of the posterior lateral tibial plateau Patient needs to be left heel touch down as weightbearing.  Discussed with physical therapy.  Discussed with the patient and his son that it would likely take 6 to 8 weeks for his fracture to heal.  He had a hemarthrosis while in the hospital.  This required arthrocentesis.  I do not think he needs another 1 right now.  He only has a small amount of fluid in the supra patellar bursa right now.  Encounter for long-term (current) use of anticoagulants Continue Eliquis  5 mg twice daily for CVA prophylaxis for A-fib.  Essential hypertension Stable.  Continue HCTZ 25 mg daily.  Mixed hyperlipidemia Continue pravastatin  80 mg daily for mixed hyperlipidemia.  Permanent atrial fibrillation (HCC) Chronic.  He has a permanent pacemaker.  He is not on any rate controlling medications.  He remains on Eliquis  for CVA prophylaxis.  S/P TAVR (transcatheter aortic valve replacement) Stable.  Follows with cardiology.    Family/ staff Communication: Discussed with the patient and his son  Camellia at bedside.   Camellia Door, DO  [facility] [phone]      [1]  Allergies Allergen Reactions   Hydralazine  Hcl Other (See Comments)    Had severe headache all night.  Nightmares.  Drugged feeling.  Extreme dizziness   Ace Inhibitors Other (See Comments)    COUGH  Product containing angiotensin-converting enzyme inhibitor (product)   Zocor [Simvastatin] Other (See Comments)    MYALGIA   Nsaids Other (See Comments)    Would avoid given anticoagulation   Prednisone  Other (See Comments)    Prednisone  caused pt to have elevated BP.   Cephalexin Rash   Chocolate Other (See Comments)    Headaches    Doxycycline  Other (See Comments)    Nausea and abd pain   Gabapentin  Other (See Comments)    Dizziness   Hycodan [Hydrocodone  Bit-Homatrop Mbr] Nausea And Vomiting   Oxycodone  Nausea Only    Pt states that he cannot take prescription pain medication

## 2024-04-11 ENCOUNTER — Encounter: Payer: Self-pay | Admitting: Internal Medicine

## 2024-04-11 DIAGNOSIS — R5381 Other malaise: Secondary | ICD-10-CM | POA: Insufficient documentation

## 2024-04-11 LAB — BASIC METABOLIC PANEL WITH GFR
BUN: 17 (ref 4–21)
CO2: 31 — AB (ref 13–22)
Chloride: 100 (ref 99–108)
Creatinine: 0.6 (ref 0.6–1.3)
Glucose: 92
Potassium: 3.7 meq/L (ref 3.5–5.1)
Sodium: 140 (ref 137–147)

## 2024-04-11 LAB — CBC AND DIFFERENTIAL
HCT: 38 — AB (ref 41–53)
Hemoglobin: 12.5 — AB (ref 13.5–17.5)
Neutrophils Absolute: 5891
Platelets: 281 K/uL (ref 150–400)
WBC: 8.6

## 2024-04-11 LAB — COMPREHENSIVE METABOLIC PANEL WITH GFR: Calcium: 8.8 (ref 8.7–10.7)

## 2024-04-11 LAB — CBC: RBC: 3.89 (ref 3.87–5.11)

## 2024-04-11 NOTE — Assessment & Plan Note (Signed)
 Chronic.  He has a permanent pacemaker.  He is not on any rate controlling medications.  He remains on Eliquis  for CVA prophylaxis.

## 2024-04-11 NOTE — Assessment & Plan Note (Signed)
 Continue with PT, OT.  Patient plans on returning home after discharge.

## 2024-04-11 NOTE — Assessment & Plan Note (Signed)
Stable.  Continue HCTZ 25mg daily

## 2024-04-11 NOTE — Assessment & Plan Note (Signed)
 Continue pravastatin  80 mg daily for mixed hyperlipidemia.

## 2024-04-11 NOTE — Assessment & Plan Note (Signed)
 Patient needs to be left heel touch down as weightbearing.  Discussed with physical therapy.  Discussed with the patient and his son that it would likely take 6 to 8 weeks for his fracture to heal.  He had a hemarthrosis while in the hospital.  This required arthrocentesis.  I do not think he needs another 1 right now.  He only has a small amount of fluid in the supra patellar bursa right now.

## 2024-04-11 NOTE — Assessment & Plan Note (Signed)
 Continue Eliquis  5 mg twice daily for CVA prophylaxis for A-fib.

## 2024-04-11 NOTE — Assessment & Plan Note (Signed)
 Stable. Follows with cardiology.

## 2024-04-15 ENCOUNTER — Ambulatory Visit

## 2024-04-15 ENCOUNTER — Ambulatory Visit (HOSPITAL_COMMUNITY)

## 2024-04-19 ENCOUNTER — Other Ambulatory Visit: Payer: Self-pay | Admitting: Orthopedic Surgery

## 2024-04-19 ENCOUNTER — Encounter: Payer: Self-pay | Admitting: Orthopedic Surgery

## 2024-04-19 ENCOUNTER — Non-Acute Institutional Stay (SKILLED_NURSING_FACILITY): Payer: Self-pay | Admitting: Orthopedic Surgery

## 2024-04-19 DIAGNOSIS — R339 Retention of urine, unspecified: Secondary | ICD-10-CM

## 2024-04-19 DIAGNOSIS — I4821 Permanent atrial fibrillation: Secondary | ICD-10-CM | POA: Diagnosis not present

## 2024-04-19 DIAGNOSIS — S82122D Displaced fracture of lateral condyle of left tibia, subsequent encounter for closed fracture with routine healing: Secondary | ICD-10-CM

## 2024-04-19 DIAGNOSIS — F329 Major depressive disorder, single episode, unspecified: Secondary | ICD-10-CM

## 2024-04-19 MED ORDER — TAMSULOSIN HCL 0.4 MG PO CAPS: 0.4000 mg | ORAL_CAPSULE | Freq: Every day | ORAL | Status: DC

## 2024-04-19 MED ORDER — OXYCODONE-ACETAMINOPHEN 5-325 MG PO TABS: 1.0000 | ORAL_TABLET | Freq: Four times a day (QID) | ORAL | 0 refills | Status: AC | PRN

## 2024-04-19 NOTE — Progress Notes (Signed)
 " Location:  Other [place].  Nursing Home Room Number: [facility] [id number] Place of Service:  SNF [phone]) Provider:  Greig Cluster, NP  PCP: Cleatus Arlyss RAMAN, MD  Patient Care Team: Cleatus Arlyss RAMAN, MD as PCP - General (Family Medicine) Inocencio Soyla Lunger, MD as PCP - Electrophysiology (Cardiology) Jeffrie Oneil BROCKS, MD as PCP - Cardiology (Cardiology) Fate Morna SAILOR, [facility] (Inactive) as Pharmacist (Pharmacist) [facility]  Extended Emergency Contact Information Primary Emergency Contact: [facility] Mobile Phone: [phone] Relation: Son Secondary Emergency Contact: [name],[name] Mobile Phone: [phone] Relation: Son  Code Status:  DNR Goals of care: Advanced Directive information    04/04/2024   11:37 AM  Advanced Directives  Does Patient Have a Medical Advance Directive? Yes  Type of Advance Directive Living will  Does patient want to make changes to medical advance directive? No - Patient declined     Chief Complaint  Patient presents with   Urinary Retention    Difficulty Urinating.     HPI:  Pt is a 89 y.o. male seen today for acute visit due to difficulty urinating.   He currently resides on the rehab unit at [facility] PMH: PAF, CAD s/p CABG x 5, aortic stenosis s/p TAVR 2024, CHB s/p PPM 03/2023, HTN, GERD, neuropathy, OA, bladder neoplasm and insomnia.   Recently hospitalized 12/09-12/12 due to fall and CVA. He was diagnosed with left tibial plateau fracture and acute lacunar infarct. He was discharged to [facility] for PT/OT/RN services. Today, he reports increased urinary retention. He is having trouble initiating urinary stream, when he does it is painful. He did not have foley catheter last hospitalization. Denies past prostate issues. PSA was 0.543 11/2013. He is taking Percocet prn for left knee pain. He is moving his bowels. Afebrile. Vitals stable.   No changes to mood and lack of motivation. 12/15 he was started  on zoloft .    Past Medical History:  Diagnosis Date   Acute CVA (cerebrovascular accident) (HCC) 04/02/2024   Arthritis    Atrial fibrillation, permanent (HCC)    Bladder neoplasm    Chronic cough    NO CARDIAC OR PULMONARY RELATED   Coronary artery disease CARDIOLOGIST-  DR KLEIN/ [facility]   a. s/p CABG 2004, b. LHC with patent grafts 07/2005, ejection fraction 50%;  c. Myoview 2/15: no ischemia, EF 61%   Cough 04/29/2009   Followed in Pulmonary clinic/ [facility]/ [place]   - Trial off cozaar  07/20/2012 >>>        Dry eyes    Dyslipidemia    Family history of colon cancer 10/31/2016   GERD (gastroesophageal reflux disease)    Hematuria 07/27/2020   Oct 13, 2003 Entered By: MARSA BOUCHARD Comment: s/p cystoscopy Willma Beat 2005, normal     History of adenomatous polyp of colon 10/31/2016   History of CVA (cerebrovascular accident) 03/10/2020   Stroke in the Left eye, and brain stem   History of CVA in adulthood 01/23/2023   HTN (hypertension)    PONV (postoperative nausea and vomiting)    From having surgery back in the 1950's   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 Amiodarone  Rx   S/P CABG x 5 2004   S/P TAVR (transcatheter aortic valve replacement) 11/15/2022   s/p TAVR with a 29mm Edwards S3UR via the TF approach by Dr. Wonda & Maryjane.   Severe aortic stenosis    Shingles 12/08/2023   Past Surgical History:  Procedure Laterality Date   CARDIAC CATHETERIZATION  07-26-2005  DR GAMBLE   PRESERVED LVF/  EF 50%/ PATENT GRAFTS   CARDIAC CATHETERIZATION  03-04-2003  DR GAMBLE   SEVERE 3 VESSEL DISEASE   CARDIAC CATHETERIZATION  1991  DR [facility]   PAF/ FALSE POSITIVE STRESS TEST   CAROTID ENDARTERECTOMY Left 2022   CATARACT EXTRACTION W/ INTRAOCULAR LENS IMPLANT Right    CORONARY ARTERY BYPASS GRAFT  03-08-2003  DR [id number]   LIMA TO LAD/ SVG TO OM2/  SVG TO DIAGONAL / SVG TO PDA & PLA   CYSTOSCOPY WITH BIOPSY N/A  12/25/2012   Procedure: CYSTOSCOPY WITH BLADDER BIOPSY;  Surgeon: Donnice Gwenyth Brooks, MD;  Location: [facility]  Service: Urology;  Laterality: N/A;   ELECTROPHYSIOLOGY STUDY  07-27-2005  DR DANELLE BIRMINGHAM   MAPPING --   RESULT NONINDUCIBLE VT OR SVT/  DX RIGHT VENTRICULAR OUTFLOW TRACT VT AND RULES OUT MORE MALIGNANT CAUSES OF VT   ENDARTERECTOMY Left 07/08/2020   Procedure: LEFT CAROTID ENDARTERECTOMY;  Surgeon: Serene Gaile ORN, MD;  Location: [facility] OR;  Service: Vascular;  Laterality: Left;   INGUINAL HERNIA REPAIR Bilateral 1954  &  1990   INTRAOPERATIVE TRANSTHORACIC ECHOCARDIOGRAM N/A 11/15/2022   Procedure: INTRAOPERATIVE TRANSTHORACIC ECHOCARDIOGRAM;  Surgeon: Wonda Sharper, MD;  Location: [facility] INVASIVE CV LAB;  Service: Open Heart Surgery;  Laterality: N/A;   KNEE ARTHROSCOPY Right 1994   LEFT HEART CATH AND CORS/GRAFTS ANGIOGRAPHY N/A 10/14/2022   Procedure: LEFT HEART CATH AND CORS/GRAFTS ANGIOGRAPHY;  Surgeon: Wonda Sharper, MD;  Location: [facility] INVASIVE CV LAB;  Service: Cardiovascular;  Laterality: N/A;   LUMBAR DISC SURGERY  1999   L4 -- L5   MOHS SURGERY     by Dr. Lyle 2019   PACEMAKER IMPLANT N/A 04/03/2023   Procedure: PACEMAKER IMPLANT;  Surgeon: Fernande Elspeth BROCKS, MD;  Location: [facility] INVASIVE CV LAB;  Service: Cardiovascular;  Laterality: N/A;   REMOVAL VOCAL CORD POLYPS  1978   TONSILLECTOMY  AS CHILD   TRANSCATHETER AORTIC VALVE REPLACEMENT, TRANSFEMORAL N/A 11/15/2022   Procedure: Transcatheter Aortic Valve Replacement, Transfemoral;  Surgeon: Wonda Sharper, MD;  Location: [facility] INVASIVE CV LAB;  Service: Open Heart Surgery;  Laterality: N/A;   TRANSTHORACIC ECHOCARDIOGRAM  08/08/2011   MILD LVH/  EF 55-60%/  GRADE I DIASTOLIC DYSFUNCTION/ MILD AV STENOSIS    Allergies[1]  Outpatient Encounter Medications as of 04/19/2024  Medication Sig   acetaminophen  (TYLENOL ) 500 MG tablet Take 500-1,000 mg by mouth every 6 (six) hours as needed (pain.).   apixaban   (ELIQUIS ) 5 MG TABS tablet Take 1 tablet (5 mg total) by mouth 2 (two) times daily. Take evening dose starting 07/09/20   butalbital -acetaminophen -caffeine  (FIORICET ) 50-325-40 MG tablet Take 1 tablet by mouth 3 (three) times daily as needed for headache.   cetirizine  (ZYRTEC ) 10 MG tablet Take 1 tablet (10 mg total) by mouth daily.   hydrochlorothiazide  (HYDRODIURIL ) 25 MG tablet Take 1 tablet (25 mg total) by mouth daily.   Iron , Ferrous Sulfate , 325 (65 Fe) MG TABS Take 325 mg by mouth daily.   latanoprost  (XALATAN ) 0.005 % ophthalmic solution Place 1 drop into both eyes at bedtime.   meclizine  (ANTIVERT ) 25 MG tablet Take 25 mg by mouth every 6 (six) hours as needed for dizziness.   oxyCODONE -acetaminophen  (PERCOCET/ROXICET) 5-325 MG tablet Take 1 tablet by mouth every 6 (six) hours as needed for severe pain (pain score 7-10).   Polyethyl Glycol-Propyl Glycol (SYSTANE ULTRA OP) Place 1-2 drops  into both eyes once as needed (dry eyes.).   polyethylene glycol (MIRALAX ) 17 g packet Take 17 g by mouth daily.   potassium chloride  (KLOR-CON  M) 10 MEQ tablet Take 10 mEq by mouth in the morning.   pravastatin  (PRAVACHOL ) 80 MG tablet Take 1 tablet (80 mg total) by mouth every evening.   senna-docusate (SENOKOT-S) 8.6-50 MG tablet Take 1 tablet by mouth at bedtime as needed for mild constipation or moderate constipation.   sertraline  (ZOLOFT ) 25 MG tablet Take 1 tablet (25 mg total) by mouth daily.   No facility-administered encounter medications on file as of 04/19/2024.    Review of Systems  Constitutional:  Negative for fatigue and fever.  HENT:  Negative for sore throat and trouble swallowing.   Eyes:  Negative for visual disturbance.  Respiratory:  Negative for cough and shortness of breath.   Cardiovascular:  Negative for chest pain and leg swelling.  Gastrointestinal:  Negative for abdominal distention, abdominal pain, constipation, diarrhea, nausea and vomiting.  Genitourinary:  Positive  for difficulty urinating, dysuria and frequency. Negative for flank pain, hematuria, penile discharge, penile pain and testicular pain.  Musculoskeletal:  Positive for arthralgias and gait problem.  Skin:  Positive for wound.  Neurological:  Positive for weakness. Negative for dizziness and headaches.  Psychiatric/Behavioral:  Positive for dysphoric mood. Negative for sleep disturbance. The patient is not nervous/anxious.     Immunization History  Administered Date(s) Administered   Fluad Quad(high Dose 65+) 12/28/2018, 02/17/2020   Fluad Trivalent(High Dose 65+) 01/23/2023   INFLUENZA, HIGH DOSE SEASONAL PF 12/25/2015, 02/19/2017, 01/28/2021   Influenza Split 03/15/2011, 01/19/2012   Influenza Whole 03/11/2002, 02/24/2009, 01/14/2010   Influenza,inj,Quad PF,6+ Mos 01/16/2013, 01/22/2014, 12/25/2014, 01/19/2016, 01/23/2017, 01/04/2018   Influenza-Unspecified 03/11/2002, 02/19/2003, 01/24/2004, 02/23/2005, 01/23/2006, 04/03/2007, 02/21/2008, 01/23/2009, 02/23/2009, 12/24/2009, 02/12/2020   PFIZER(Purple Top)SARS-COV-2 Vaccination 06/28/2019, 07/19/2019, 09/18/2019, 03/24/2020   Pneumococcal Conjugate-13 11/27/2013, 12/25/2014   Pneumococcal Polysaccharide-23 04/17/2002   Pneumococcal-Unspecified 03/11/2002   Td 12/07/2001, 04/26/2005, 05/11/2005   Td,absorbed, Preservative Free, Adult Use, Lf Unspecified 10/25/2021   Tdap 12/08/2011, 10/25/2021   Tetanus 11/24/2011   Zoster Recombinant(Shingrix) 08/20/2020, 01/01/2021   Zoster, Live 05/17/2007   Pertinent  Health Maintenance Due  Topic Date Due   Colonoscopy  05/30/2023   Influenza Vaccine  07/23/2024 (Originally 11/24/2023)      01/23/2023   12:42 PM 02/13/2023    3:46 PM 08/21/2023    2:09 PM 12/08/2023   12:32 PM 04/09/2024    1:52 PM  Fall Risk  Falls in the past year? 0 1 1 1 1   Was there an injury with Fall? 0  1  0  0  1  Was there an injury with Fall? - Comments  fell due to stroke      Fall Risk Category Calculator 0 2 2  1 2   Patient at Risk for Falls Due to No Fall Risks No Fall Risks History of fall(s) Impaired mobility;History of fall(s) History of fall(s);Impaired balance/gait  Fall risk Follow up Falls evaluation completed Education provided;Falls prevention discussed Falls evaluation completed Falls evaluation completed Falls evaluation completed     Data saved with a previous flowsheet row definition   Functional Status Survey:    Vitals:   04/19/24 0930  BP: (!) 180/81  Pulse: 65  Resp: 12  Temp: 97.7 F (36.5 C)  SpO2: 97%  Weight: 168 lb 6.4 oz (76.4 kg)  Height: 5' 10 (1.778 m)   Body mass index is 24.16 kg/m. Physical Exam  Vitals reviewed.  Constitutional:      General: He is not in acute distress. HENT:     Head: Normocephalic.  Eyes:     General:        Right eye: No discharge.        Left eye: No discharge.  Cardiovascular:     Rate and Rhythm: Normal rate and regular rhythm.     Pulses: Normal pulses.     Heart sounds: Normal heart sounds.  Pulmonary:     Effort: Pulmonary effort is normal.     Breath sounds: Normal breath sounds.  Abdominal:     General: Bowel sounds are normal. There is no distension.     Palpations: Abdomen is soft.     Tenderness: There is no abdominal tenderness. There is no right CVA tenderness or left CVA tenderness.  Musculoskeletal:     Cervical back: Neck supple.     Right lower leg: No edema.     Left lower leg: No edema.  Skin:    General: Skin is warm.     Capillary Refill: Capillary refill takes less than 2 seconds.  Neurological:     General: No focal deficit present.     Mental Status: He is alert and oriented to person, place, and time.     Motor: Weakness present.     Gait: Gait abnormal.  Psychiatric:        Mood and Affect: Mood normal.     Labs reviewed: Recent Labs    01/01/24 0936 04/02/24 0845 04/03/24 0114 04/11/24 0000  NA 139 140 138 140  K 3.4* 3.3* 3.7 3.7  CL 99 101 102 100  CO2 27 29 26  31*  GLUCOSE  104* 99 148*  --   BUN 14 14 14 17   CREATININE 0.88 0.77 1.03 0.6  CALCIUM  9.2 9.3 8.7* 8.8  MG  --  2.1  --   --    Recent Labs    12/28/23 0947 04/02/24 0845 04/03/24 0114  AST 25 28 40  ALT 13 15 16   ALKPHOS 63 62 53  BILITOT 0.7 1.6* 1.9*  PROT 7.2 7.1 7.0  ALBUMIN 4.0 3.9 3.7   Recent Labs    12/28/23 0947 01/01/24 0936 04/02/24 0845 04/03/24 0114 04/05/24 0236 04/11/24 0000  WBC 4.6   < > 7.5 10.6* 8.6 8.6  NEUTROABS 2.7  --   --   --   --  5,891.00  HGB 12.8*   < > 14.5 14.1 12.6* 12.5*  HCT 38.3*   < > 42.5 42.3 36.8* 38*  MCV 96.5   < > 99.3 98.1 98.9  --   PLT 153.0   < > 135* 147* 146* 281   < > = values in this interval not displayed.   Lab Results  Component Value Date   TSH 1.89 12/28/2023   Lab Results  Component Value Date   HGBA1C 5.2 04/03/2024   Lab Results  Component Value Date   CHOL 140 04/03/2024   HDL 70 04/03/2024   LDLCALC 62 04/03/2024   LDLDIRECT 88 07/27/2020   TRIG 41 04/03/2024   CHOLHDL 2.0 04/03/2024    Significant Diagnostic Results in last 30 days:  MR KNEE LEFT WO CONTRAST Result Date: 04/04/2024 CLINICAL DATA:  Clemens.  Left knee pain. EXAM: MRI OF THE LEFT KNEE WITHOUT CONTRAST TECHNIQUE: Multiplanar, multisequence MR imaging of the knee was performed. No intravenous contrast was administered. COMPARISON:  Radiographs and CT scan 04/02/2024 FINDINGS:  MENISCI Medial meniscus: Horizontal cleavage and free edge tears involving the posterior horn and midbody junction region. Lateral meniscus:  Intact LIGAMENTS Cruciates:  Intact Collaterals:  Intact.  Mild MCL and pes anserine bursitis. CARTILAGE Patellofemoral: Advanced degenerative chondrosis with full-thickness cartilage loss. Medial: Moderate degenerative chondrosis with cartilage thinning, fraying and fibrillation Lateral: Moderate degenerative chondrosis mainly involving the tibial articular cartilage. Joint: There is a very large joint effusion which is complicated by  hematoma. There is also a long and thick ligamentum mucosa noted. Areas of synovial thickening are also noted. Popliteal Fossa: Moderate-sized leaking Baker's cyst also containing complex fluid, likely hematoma. Extensor Mechanism: The patella retinacular structures are intact and the quadriceps and patellar tendons are intact. Bones: There is a small subchondral impaction type fracture involving the lateral tibial plateau posteriorly with surrounding marrow edema. Small defects in the subchondral plate. No femur or fibular fracture. No patellar fracture. Other: Unremarkable knee musculature. IMPRESSION: 1. Small subchondral impaction type fracture involving the lateral tibial plateau posteriorly with surrounding marrow edema. 2. Horizontal cleavage and free edge tears involving the posterior horn and midbody junction region of the medial meniscus. 3. Intact ligamentous structures. 4. Very large joint effusion which is complicated by hematoma. There is also a long and thick ligamentum mucosa noted. Areas of synovial thickening are also noted. 5. Moderate-sized leaking Baker's cyst also containing complex fluid, likely hematoma. 6. Tricompartmental degenerative changes most significant in the patellofemoral joint. Electronically Signed   By: MYRTIS Stammer M.D.   On: 04/04/2024 21:07   ECHOCARDIOGRAM COMPLETE BUBBLE STUDY Result Date: 04/03/2024    ECHOCARDIOGRAM REPORT   Patient Name:   BAYLIN GAMBLIN Date of Exam: 04/03/2024 Medical Rec #:  [id number]           Height:       70.0 in Accession #:    [id number]          Weight:       169.8 lb Date of Birth:  1934/06/19           BSA:          1.947 m Patient Age:    89 years            BP:           150/74 mmHg Patient Gender: M                   HR:           58 bpm. Exam Location:  Inpatient Procedure: 2D Echo, Cardiac Doppler, Color Doppler and Saline Contrast Bubble            Study (Both Spectral and Color Flow Doppler were utilized during             procedure). Indications:    Stroke  History:        Patient has prior history of Echocardiogram examinations, most                 recent 10/26/2023. CAD, Arrythmias:Atrial Fibrillation; Risk                 Factors:Hypertension, Dyslipidemia and Former Smoker.                 Aortic Valve: 29 mm Sapien prosthetic, stented (TAVR) valve is                 present in the aortic position. Procedure Date: 11/15/2022.  Sonographer:    Merlynn Argyle  Referring Phys: [id number] MARSA NOVAK MELVIN  Sonographer Comments: Image acquisition challenging due to respiratory motion. IMPRESSIONS  1. Left ventricular ejection fraction, by estimation, is 45 to 50%. The left ventricle has mildly decreased function. The left ventricle demonstrates regional wall motion abnormalities (see scoring diagram/findings for description). The left ventricular  internal cavity size was mildly dilated. There is mild concentric left ventricular hypertrophy. Left ventricular diastolic parameters are indeterminate.  2. Right ventricular systolic function is normal. The right ventricular size is mildly enlarged. There is mildly elevated pulmonary artery systolic pressure. The estimated right ventricular systolic pressure is 37.4 mmHg.  3. Left atrial size was severely dilated.  4. The mitral valve is degenerative. Mild mitral valve regurgitation. No evidence of mitral stenosis. Severe mitral annular calcification.  5. Mild perivalvular leak. The aortic valve has been repaired/replaced. Aortic valve regurgitation is mild. No aortic stenosis is present. There is a 29 mm Sapien prosthetic (TAVR) valve present in the aortic position. Procedure Date: 11/15/2022. Echo findings are consistent with perivalvular leak of the aortic prosthesis.  6. Aortic dilatation noted. There is mild dilatation of the ascending aorta, measuring 42 mm.  7. The inferior vena cava is normal in size with greater than 50% respiratory variability, suggesting right atrial pressure of 3 mmHg.   8. Agitated saline contrast bubble study was negative, with no evidence of any interatrial shunt. Conclusion(s)/Recommendation(s): No intracardiac source of embolism detected on this transthoracic study. Consider a transesophageal echocardiogram to exclude cardiac source of embolism if clinically indicated. FINDINGS  Left Ventricle: Left ventricular ejection fraction, by estimation, is 45 to 50%. The left ventricle has mildly decreased function. The left ventricle demonstrates regional wall motion abnormalities. The left ventricular internal cavity size was mildly dilated. There is mild concentric left ventricular hypertrophy. Abnormal (paradoxical) septal motion, consistent with left bundle branch block. Left ventricular diastolic parameters are indeterminate.  LV Wall Scoring: The mid inferoseptal segment is hypokinetic. Right Ventricle: The right ventricular size is mildly enlarged. No increase in right ventricular wall thickness. Right ventricular systolic function is normal. There is mildly elevated pulmonary artery systolic pressure. The tricuspid regurgitant velocity is 2.71 m/s, and with an assumed right atrial pressure of 8 mmHg, the estimated right ventricular systolic pressure is 37.4 mmHg. Left Atrium: Left atrial size was severely dilated. Right Atrium: Right atrial size was normal in size. Pericardium: There is no evidence of pericardial effusion. Mitral Valve: The mitral valve is degenerative in appearance. Severe mitral annular calcification. Mild mitral valve regurgitation. No evidence of mitral valve stenosis. MV peak gradient, 5.0 mmHg. The mean mitral valve gradient is 2.0 mmHg. Tricuspid Valve: The tricuspid valve is normal in structure. Tricuspid valve regurgitation is mild . No evidence of tricuspid stenosis. Aortic Valve: Mild perivalvular leak. The aortic valve has been repaired/replaced. Aortic valve regurgitation is mild. No aortic stenosis is present. Aortic valve mean gradient measures  8.0 mmHg. Aortic valve peak gradient measures 16.5 mmHg. Aortic valve area, by VTI measures 1.87 cm. There is a 29 mm Sapien prosthetic, stented (TAVR) valve present in the aortic position. Procedure Date: 11/15/2022. Pulmonic Valve: The pulmonic valve was normal in structure. Pulmonic valve regurgitation is mild to moderate. No evidence of pulmonic stenosis. Aorta: Aortic dilatation noted. There is mild dilatation of the ascending aorta, measuring 42 mm. Venous: The inferior vena cava is normal in size with greater than 50% respiratory variability, suggesting right atrial pressure of 3 mmHg. IAS/Shunts: No atrial level shunt detected by color flow Doppler. Agitated  saline contrast was given intravenously to evaluate for intracardiac shunting. Agitated saline contrast bubble study was negative, with no evidence of any interatrial shunt. Additional Comments: A device lead is visualized in the right ventricle.  LEFT VENTRICLE PLAX 2D LVIDd:         5.70 cm   Diastology LVIDs:         3.90 cm   LV e' medial: 6.98 cm/s LV PW:         1.20 cm LV IVS:        1.20 cm LVOT diam:     2.20 cm LV SV:         70 LV SV Index:   36 LVOT Area:     3.80 cm  RIGHT VENTRICLE             IVC RV Basal diam:  4.30 cm     IVC diam: 2.30 cm RV Mid diam:    3.40 cm RV S prime:     11.60 cm/s TAPSE (M-mode): 1.5 cm LEFT ATRIUM              Index        RIGHT ATRIUM           Index LA diam:        4.10 cm  2.11 cm/m   RA Area:     21.90 cm LA Vol (A2C):   121.0 ml 62.15 ml/m  RA Volume:   60.20 ml  30.92 ml/m LA Vol (A4C):   62.2 ml  31.95 ml/m LA Biplane Vol: 88.3 ml  45.36 ml/m  AORTIC VALVE AV Area (Vmax):    1.74 cm AV Area (Vmean):   1.89 cm AV Area (VTI):     1.87 cm AV Vmax:           203.25 cm/s AV Vmean:          130.500 cm/s AV VTI:            0.372 m AV Peak Grad:      16.5 mmHg AV Mean Grad:      8.0 mmHg LVOT Vmax:         93.00 cm/s LVOT Vmean:        64.950 cm/s LVOT VTI:          0.183 m LVOT/AV VTI ratio: 0.49   AORTA Ao Root diam: 3.90 cm Ao Asc diam:  4.20 cm MITRAL VALVE             TRICUSPID VALVE MV Area VTI:  2.45 cm   TR Peak grad:   29.4 mmHg MV Peak grad: 5.0 mmHg   TR Vmax:        271.00 cm/s MV Mean grad: 2.0 mmHg MV Vmax:      1.12 m/s   SHUNTS MV Vmean:     59.6 cm/s  Systemic VTI:  0.18 m                          Systemic Diam: 2.20 cm Oneil Parchment MD Electronically signed by Oneil Parchment MD Signature Date/Time: 04/03/2024/3:34:13 PM    Final    CT ANGIO HEAD NECK W WO CM Result Date: 04/02/2024 EXAM: CTA HEAD AND NECK WITH AND WITHOUT 04/02/2024 09:07:20 PM TECHNIQUE: CTA of the head and neck was performed with and without the administration of intravenous contrast. Multiplanar 2D and/or 3D reformatted images are provided for review. Automated exposure control, iterative  reconstruction, and/or weight based adjustment of the mA/kV was utilized to reduce the radiation dose to as low as reasonably achievable. Stenosis of the internal carotid arteries measured using NASCET criteria. COMPARISON: CTA head/neck from 01/09/2023 CLINICAL HISTORY: Stroke/TIA, determine embolic source FINDINGS: AORTIC ARCH AND ARCH VESSELS: No dissection or arterial injury. No significant stenosis of the brachiocephalic or subclavian arteries. CERVICAL CAROTID ARTERIES: No dissection, arterial injury, or hemodynamically significant stenosis by NASCET criteria. CERVICAL VERTEBRAL ARTERIES: No dissection, arterial injury, or significant stenosis. LUNGS AND MEDIASTINUM: Unremarkable. SOFT TISSUES: No acute abnormality. BONES: No acute abnormality. ANTERIOR CIRCULATION: No significant stenosis of the internal carotid arteries. No significant stenosis of the anterior cerebral arteries. No significant stenosis of the middle cerebral arteries. No aneurysm. POSTERIOR CIRCULATION: No significant stenosis of the posterior cerebral arteries. No significant stenosis of the basilar artery. No significant stenosis of the vertebral arteries. No  aneurysm. OTHER: No dural venous sinus thrombosis on this non-dedicated study. IMPRESSION: 1. No large vessel occlusion or hemodynamically significant stenosis. Electronically signed by: Gilmore Molt MD 04/02/2024 10:27 PM EST RP Workstation: [id number]   CT Knee Left Wo Contrast Result Date: 04/02/2024 CLINICAL DATA:  Fall.  Pain. EXAM: CT OF THE LEFT KNEE WITHOUT CONTRAST TECHNIQUE: Multidetector CT imaging of the left knee was performed according to the standard protocol. Multiplanar CT image reconstructions were also generated. RADIATION DOSE REDUCTION: This exam was performed according to the departmental dose-optimization program which includes automated exposure control, adjustment of the mA and/or kV according to patient size and/or use of iterative reconstruction technique. COMPARISON:  Left knee radiograph dated 01/02/2024. FINDINGS: Bones/Joint/Cartilage No acute fracture or dislocation. Moderate-to-large joint effusion. No evidence of lipohemarthrosis. Chondrocalcinosis in the medial and lateral femorotibial compartments. Tricompartmental osteoarthritis with mild joint space narrowing and osteophytosis. Ligaments Ligaments are suboptimally evaluated by CT. Muscles and Tendons A Baker's cyst is noted. Quadriceps and patellar tendons appear intact. Soft tissue No loculated fluid collection. Peripheral vascular calcifications are noted. IMPRESSION: 1. No acute fracture or dislocation. 2. Moderate-to-large knee joint effusion. No evidence of lipohemarthrosis. 3. Tricompartmental osteoarthritis chondrocalcinosis in the medial and lateral compartments. Electronically Signed   By: Harrietta Sherry M.D.   On: 04/02/2024 13:39   MR BRAIN WO CONTRAST Result Date: 04/02/2024 CLINICAL DATA:  Syncope/presyncope EXAM: MRI HEAD WITHOUT CONTRAST TECHNIQUE: Multiplanar, multiecho pulse sequences of the brain and surrounding structures were obtained without intravenous contrast. COMPARISON:  August 02, 2023  FINDINGS: MRI brain: There is a punctate focus of restricted diffusion in the left frontal white matter and there are 2 punctate foci of restricted diffusion in the right frontal white matter. There are extensive and confluent foci of T2 hyperintensity in the cerebral white matter. These do not have restricted diffusion. The ventricles are normal. No mass lesion. There are normal flow signals in the carotid arteries and basilar artery. No significant bone marrow signal abnormality. No significant abnormality in the paranasal sinuses or soft tissues. IMPRESSION: 1. Punctate acute lacunar infarct in the left frontal white matter and 2 punctate acute lacunar infarcts in the right frontal white matter 2. Extensive chronic white matter disease Electronically Signed   By: Nancyann Burns M.D.   On: 04/02/2024 13:15   DG Chest 2 View Result Date: 04/02/2024 EXAM: 2 VIEW(S) XRAY OF THE CHEST 04/02/2024 08:03:00 AM COMPARISON: 04/03/2023 CLINICAL HISTORY: dizziness FINDINGS: LUNGS AND PLEURA: No focal pulmonary opacity. No pleural effusion. No pneumothorax. HEART AND MEDIASTINUM: Mild cardiomegaly. Aortic valve prosthesis noted. CABG noted. Left chest single lead  pacemaker noted. Aortic arch calcifications. BONES AND SOFT TISSUES: Sternotomy wires noted. Thoracic spondylosis. No acute osseous abnormality. IMPRESSION: 1. No acute findings. 2. Mild cardiomegaly with aortic valve prosthesis, CABG, and left chest single lead pacemaker. Electronically signed by: Evalene Coho MD 04/02/2024 08:30 AM EST RP Workstation: [id number]   DG Hip Unilat W or Wo Pelvis 2-3 Views Left Result Date: 04/02/2024 CLINICAL DATA:  Status post fall EXAM: DG HIP (WITH OR WITHOUT PELVIS) 3V LEFT COMPARISON:  None Available. FINDINGS: There is no evidence of hip fracture or dislocation. Mild degenerative changes of the hips. IMPRESSION: No acute fracture or dislocation. Mild degenerative changes of the hips. Electronically Signed   By: Limin  Xu  M.D.   On: 04/02/2024 08:29   DG Knee Complete 4 Views Left Result Date: 04/02/2024 EXAM: 4 OR MORE VIEW(S) XRAY OF THE LEFT KNEE 04/02/2024 08:03:00 AM COMPARISON: None available. CLINICAL HISTORY: fall FINDINGS: BONES AND JOINTS: No acute fracture. No malalignment. There is tricompartmental osteoarthritis. There are calcifications present within the medial meniscus. There is a moderate suprapatellar bursal effusion. SOFT TISSUES: There are moderate atheromatous calcifications present. IMPRESSION: 1. 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 2. Moderate suprapatellar bursal effusion. Electronically signed by: Evalene Coho MD 04/02/2024 08:29 AM EST RP Workstation: [id number]   CT HEAD WO CONTRAST ( ) Result Date: 04/02/2024 EXAM: CT HEAD WITHOUT CONTRAST 04/02/2024 07:29:16 AM TECHNIQUE: CT of the head was performed without the administration of intravenous contrast. Automated exposure control, iterative reconstruction, and/or weight based adjustment of the mA/kV was utilized to reduce the radiation dose to as low as reasonably achievable. COMPARISON: CT of the head dated 01/09/2023. CLINICAL HISTORY: Syncope/presyncope, cerebrovascular cause suspected. FINDINGS: BRAIN AND VENTRICLES: No acute hemorrhage. No evidence of acute infarct. No hydrocephalus. No extra-axial collection. No mass effect or midline shift. There is age-related atrophy and moderate diffuse cerebral white matter disease. There are senescent calcifications within the basal ganglia bilaterally. There are atheromatous calcifications within the carotid siphons and vertebral arteries. ORBITS: No acute abnormality. The patient is status post bilateral lens replacement. SINUSES: No acute abnormality. SOFT TISSUES AND SKULL: No acute soft tissue abnormality. No skull fracture. IMPRESSION: 1. No acute intracranial abnormality. 2. Age-related atrophy and moderate diffuse cerebral white matter disease.  Electronically signed by: Evalene Coho MD 04/02/2024 07:32 AM EST RP Workstation: [id number]   CUP PACEART REMOTE DEVICE CHECK Result Date: 04/01/2024 PPM Scheduled remote reviewed. Normal device function.  Presenting rhythm: VP, PVCs. Next remote transmission per protocol. - CS, CVRS   Assessment/Plan 1. Urinary retention (Primary) - onset 2-3 days - no h/o prostate issues per chart review - last PSA normal 11/2013 - no recent foley use in hospital - abdomen soft, moving bowels - will trial flomax  for symptoms  - recommend post void bladder scan to see about retention  - tamsulosin  (FLOMAX ) 0.4 MG CAPS capsule; Take 1 capsule (0.4 mg total) by mouth daily.  2. Closed fracture of lateral portion of left tibial plateau with routine healing, subsequent encounter - noted on MRI left knee  - orthopedics did not recommend surgery - arthrocentesis also performed due to swelling  - WBAT - pain improved with Percocet - cont PT/OT  3. Reactive depression - 12/15 started on Zoloft  - no improvement in mood  4. Permanent atrial fibrillation (HCC) - has pacemaker - not on rate control medication - cont Eliquis      Family/ staff Communication: plan discussed with patient and nurse  Labs/tests ordered:  bladder scan         [  1]  Allergies Allergen Reactions   Hydralazine  Hcl Other (See Comments)    Had severe headache all night.  Nightmares.  Drugged feeling.  Extreme dizziness   Ace Inhibitors Other (See Comments)    COUGH  Product containing angiotensin-converting enzyme inhibitor (product)   Zocor [Simvastatin] Other (See Comments)    MYALGIA   Nsaids Other (See Comments)    Would avoid given anticoagulation   Prednisone  Other (See Comments)    Prednisone  caused pt to have elevated BP.   Cephalexin Rash   Chocolate Other (See Comments)    Headaches    Doxycycline  Other (See Comments)    Nausea and abd pain   Gabapentin  Other (See Comments)    Dizziness    Hycodan [Hydrocodone  Bit-Homatrop Mbr] Nausea And Vomiting   Oxycodone  Nausea Only    Pt states that he cannot take prescription pain medication   "

## 2024-04-20 ENCOUNTER — Encounter: Payer: Self-pay | Admitting: Nurse Practitioner

## 2024-04-20 NOTE — Progress Notes (Unsigned)
 Called to report uncontrolled leg pain, restless legs at night interfering sleep/rest @night . Has Percocet available to him, but the patient didnt want to take it, said its not worth taking it. Failed Gabapentin  in the past. Willing to try Lorazepam : 0.5mg  Q6hr for 72 hours

## 2024-04-22 ENCOUNTER — Non-Acute Institutional Stay (SKILLED_NURSING_FACILITY): Payer: Self-pay | Admitting: Orthopedic Surgery

## 2024-04-22 ENCOUNTER — Encounter: Payer: Self-pay | Admitting: Orthopedic Surgery

## 2024-04-22 ENCOUNTER — Other Ambulatory Visit: Payer: Self-pay | Admitting: *Deleted

## 2024-04-22 ENCOUNTER — Other Ambulatory Visit: Payer: Self-pay | Admitting: Orthopedic Surgery

## 2024-04-22 DIAGNOSIS — R339 Retention of urine, unspecified: Secondary | ICD-10-CM

## 2024-04-22 DIAGNOSIS — F329 Major depressive disorder, single episode, unspecified: Secondary | ICD-10-CM

## 2024-04-22 DIAGNOSIS — S82122D Displaced fracture of lateral condyle of left tibia, subsequent encounter for closed fracture with routine healing: Secondary | ICD-10-CM | POA: Diagnosis not present

## 2024-04-22 DIAGNOSIS — F419 Anxiety disorder, unspecified: Secondary | ICD-10-CM

## 2024-04-22 LAB — BASIC METABOLIC PANEL WITH GFR
BUN: 13 (ref 4–21)
CO2: 30 — AB (ref 13–22)
Chloride: 100 (ref 99–108)
Creatinine: 0.7 (ref 0.6–1.3)
Glucose: 89
Potassium: 3.4 meq/L — AB (ref 3.5–5.1)
Sodium: 139 (ref 137–147)

## 2024-04-22 LAB — COMPREHENSIVE METABOLIC PANEL WITH GFR
Calcium: 8.7 (ref 8.7–10.7)
eGFR: 88

## 2024-04-22 NOTE — Patient Outreach (Signed)
" ° °  04/22/2024  [name] [name] [birth year] [id number]   Per [facility], [name] resides in [place]. [name] previously admitted to [facility]) under [facility] waiver.   Secure message sent to [facility] Admission Coordinator to inquire about transition plans and barriers.  Will await response.   Pablo Hurst, MSN, RN, BSN [place]  [facility] RN Care Manager Direct Dial: [phone]                "

## 2024-04-22 NOTE — Progress Notes (Signed)
 " Location:  Other [place].  Nursing Home Room Number: [facility] [id number] Place of Service:  SNF [phone]) Provider:  Greig Cluster, NP  PCP: Cleatus Arlyss RAMAN, MD  Patient Care Team: Cleatus Arlyss RAMAN, MD as PCP - General (Family Medicine) Inocencio Soyla Lunger, MD as PCP - Electrophysiology (Cardiology) Jeffrie Oneil BROCKS, MD as PCP - Cardiology (Cardiology) Fate Morna SAILOR, [facility] (Inactive) as Pharmacist (Pharmacist) [facility]  Extended Emergency Contact Information Primary Emergency Contact: [facility] Mobile Phone: [phone] Relation: Son Secondary Emergency Contact: [name],[name] Mobile Phone: [phone] Relation: Son  Code Status:  DNR Goals of care: Advanced Directive information    04/04/2024   11:37 AM  Advanced Directives  Does Patient Have a Medical Advance Directive? Yes  Type of Advance Directive Living will  Does patient want to make changes to medical advance directive? No - Patient declined     Chief Complaint  Patient presents with   Anxiety    Anxiety    HPI:  Pt is a 89 y.o. male seen today for acute visit due to anxiety.   He currently resides on the rehab unit at [facility]. PMH: PAF, CAD s/p CABG x 5, aortic stenosis s/p TAVR 2024, CHB s/p PPM 03/2023, HTN, GERD, neuropathy, OA, bladder neoplasm and insomnia.   12/26 he was started Flomax  for urinary retention. Nursing reports improved UOP.   Over the weekend, he had increased anxiety and agitation. On call provider started ativan  prn. He denies anxiety this morning. He reports not sleeping well due to knee pain. He was admitted earlier this month due to left tibial plateau fracture due to mechanical fall. Remains on percocet for pain. He is sitting in bed with legs crossed over each other. I asked him if sitting like that caused pain and he said  no. No recent falls or injuries. He continues to work with PT/OT. Afebrile. Vitals stable.   12/15 started on zoloft  due  to reactive depression. Wife passed earlier this year. Sons main support systems.   Recent BIMS score 15/15.     Past Medical History:  Diagnosis Date   Acute CVA (cerebrovascular accident) (HCC) 04/02/2024   Arthritis    Atrial fibrillation, permanent (HCC)    Bladder neoplasm    Chronic cough    NO CARDIAC OR PULMONARY RELATED   Coronary artery disease CARDIOLOGIST-  DR KLEIN/ [facility]   a. s/p CABG 2004, b. LHC with patent grafts 07/2005, ejection fraction 50%;  c. Myoview 2/15: no ischemia, EF 61%   Cough 04/29/2009   Followed in Pulmonary clinic/ [facility]/ [facility]   - Trial off cozaar  07/20/2012 >>>        Dry eyes    Dyslipidemia    Family history of colon cancer 10/31/2016   GERD (gastroesophageal reflux disease)    Hematuria 07/27/2020   Oct 13, 2003 Entered By: MARSA BOUCHARD Comment: s/p cystoscopy Willma Beat 2005, normal     History of adenomatous polyp of colon 10/31/2016   History of CVA (cerebrovascular accident) 03/10/2020   Stroke in the Left eye, and brain stem   History of CVA in adulthood 01/23/2023   HTN (hypertension)    PONV (postoperative nausea and vomiting)    From having surgery back in the 1950's   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 Amiodarone  Rx   S/P CABG x 5 2004   S/P TAVR (transcatheter aortic valve replacement) 11/15/2022   s/p TAVR with a 29mm  Celestia MOLDER via the TF approach by Dr. Wonda & Maryjane.   Severe aortic stenosis    Shingles 12/08/2023   Past Surgical History:  Procedure Laterality Date   CARDIAC CATHETERIZATION  07-26-2005  DR GAMBLE   PRESERVED LVF/  EF 50%/ PATENT GRAFTS   CARDIAC CATHETERIZATION  03-04-2003  DR GAMBLE   SEVERE 3 VESSEL DISEASE   CARDIAC CATHETERIZATION  1991  DR [facility]   PAF/ FALSE POSITIVE STRESS TEST   CAROTID ENDARTERECTOMY Left 2022   CATARACT EXTRACTION W/ INTRAOCULAR LENS IMPLANT Right    CORONARY ARTERY BYPASS GRAFT  03-08-2003  DR [id number]    LIMA TO LAD/ SVG TO OM2/  SVG TO DIAGONAL / SVG TO PDA & PLA   CYSTOSCOPY WITH BIOPSY N/A 12/25/2012   Procedure: CYSTOSCOPY WITH BLADDER BIOPSY;  Surgeon: Donnice Gwenyth Brooks, MD;  Location: [facility];  Service: Urology;  Laterality: N/A;   ELECTROPHYSIOLOGY STUDY  07-27-2005  DR DANELLE [place]   MAPPING --   RESULT NONINDUCIBLE VT OR SVT/  DX RIGHT VENTRICULAR OUTFLOW TRACT VT AND RULES OUT MORE MALIGNANT CAUSES OF VT   ENDARTERECTOMY Left 07/08/2020   Procedure: LEFT CAROTID ENDARTERECTOMY;  Surgeon: Serene Gaile ORN, MD;  Location: [facility] OR;  Service: Vascular;  Laterality: Left;   INGUINAL HERNIA REPAIR Bilateral 1954  &  1990   INTRAOPERATIVE TRANSTHORACIC ECHOCARDIOGRAM N/A 11/15/2022   Procedure: INTRAOPERATIVE TRANSTHORACIC ECHOCARDIOGRAM;  Surgeon: Wonda Sharper, MD;  Location: [facility] INVASIVE CV LAB;  Service: Open Heart Surgery;  Laterality: N/A;   KNEE ARTHROSCOPY Right 1994   LEFT HEART CATH AND CORS/GRAFTS ANGIOGRAPHY N/A 10/14/2022   Procedure: LEFT HEART CATH AND CORS/GRAFTS ANGIOGRAPHY;  Surgeon: Wonda Sharper, MD;  Location: [facility] INVASIVE CV LAB;  Service: Cardiovascular;  Laterality: N/A;   LUMBAR DISC SURGERY  1999   L4 -- L5   MOHS SURGERY     by Dr. Lyle 2019   PACEMAKER IMPLANT N/A 04/03/2023   Procedure: PACEMAKER IMPLANT;  Surgeon: Fernande Elspeth BROCKS, MD;  Location: [facility] INVASIVE CV LAB;  Service: Cardiovascular;  Laterality: N/A;   REMOVAL VOCAL CORD POLYPS  1978   TONSILLECTOMY  AS CHILD   TRANSCATHETER AORTIC VALVE REPLACEMENT, TRANSFEMORAL N/A 11/15/2022   Procedure: Transcatheter Aortic Valve Replacement, Transfemoral;  Surgeon: Wonda Sharper, MD;  Location: [facility] INVASIVE CV LAB;  Service: Open Heart Surgery;  Laterality: N/A;   TRANSTHORACIC ECHOCARDIOGRAM  08/08/2011   MILD LVH/  EF 55-60%/  GRADE I DIASTOLIC DYSFUNCTION/ MILD AV STENOSIS    Allergies[1]  Outpatient Encounter Medications as of 04/22/2024  Medication Sig   acetaminophen   (TYLENOL ) 500 MG tablet Take 500-1,000 mg by mouth every 6 (six) hours as needed (pain.).   apixaban  (ELIQUIS ) 5 MG TABS tablet Take 1 tablet (5 mg total) by mouth 2 (two) times daily. Take evening dose starting 07/09/20   butalbital -acetaminophen -caffeine  (FIORICET ) 50-325-40 MG tablet Take 1 tablet by mouth 3 (three) times daily as needed for headache.   cetirizine  (ZYRTEC ) 10 MG tablet Take 1 tablet (10 mg total) by mouth daily.   hydrochlorothiazide  (HYDRODIURIL ) 25 MG tablet Take 1 tablet (25 mg total) by mouth daily.   hydrOXYzine  (ATARAX ) 25 MG tablet Take 25 mg by mouth daily as needed.   Iron , Ferrous Sulfate , 325 (65 Fe) MG TABS Take 325 mg by mouth daily.   latanoprost  (XALATAN ) 0.005 % ophthalmic solution Place 1 drop into both eyes at bedtime.   LORazepam  (ATIVAN ) 0.5 MG tablet Take 0.5 mg by mouth every  6 (six) hours as needed for anxiety.   meclizine  (ANTIVERT ) 25 MG tablet Take 25 mg by mouth every 6 (six) hours as needed for dizziness.   oxyCODONE -acetaminophen  (PERCOCET/ROXICET) 5-325 MG tablet Take 1 tablet by mouth every 6 (six) hours as needed for up to 5 days for severe pain (pain score 7-10).   Polyethyl Glycol-Propyl Glycol (SYSTANE ULTRA OP) Place 1-2 drops into both eyes once as needed (dry eyes.).   polyethylene glycol (MIRALAX ) 17 g packet Take 17 g by mouth daily.   potassium chloride  (KLOR-CON  M) 10 MEQ tablet Take 10 mEq by mouth in the morning.   pravastatin  (PRAVACHOL ) 80 MG tablet Take 1 tablet (80 mg total) by mouth every evening.   senna-docusate (SENOKOT-S) 8.6-50 MG tablet Take 1 tablet by mouth at bedtime as needed for mild constipation or moderate constipation.   sertraline  (ZOLOFT ) 25 MG tablet Take 1 tablet (25 mg total) by mouth daily.   tamsulosin  (FLOMAX ) 0.4 MG CAPS capsule Take 1 capsule (0.4 mg total) by mouth daily. (Patient not taking: Reported on 04/22/2024)   No facility-administered encounter medications on file as of 04/22/2024.    Review of  Systems  Constitutional:  Negative for fatigue and fever.  HENT:  Negative for sore throat and trouble swallowing.   Eyes:  Negative for visual disturbance.  Respiratory:  Negative for cough and shortness of breath.   Cardiovascular:  Negative for chest pain and leg swelling.  Gastrointestinal:  Negative for abdominal distention and abdominal pain.  Genitourinary:  Positive for difficulty urinating. Negative for dysuria, frequency and hematuria.  Musculoskeletal:  Positive for arthralgias and gait problem.  Skin:  Negative for wound.  Neurological:  Positive for weakness. Negative for dizziness and headaches.  Psychiatric/Behavioral:  Positive for agitation and dysphoric mood. Negative for confusion and sleep disturbance. The patient is nervous/anxious.     Immunization History  Administered Date(s) Administered   Fluad Quad(high Dose 65+) 12/28/2018, 02/17/2020   Fluad Trivalent(High Dose 65+) 01/23/2023   INFLUENZA, HIGH DOSE SEASONAL PF 12/25/2015, 02/19/2017, 01/28/2021   Influenza Split 03/15/2011, 01/19/2012   Influenza Whole 03/11/2002, 02/24/2009, 01/14/2010   Influenza,inj,Quad PF,6+ Mos 01/16/2013, 01/22/2014, 12/25/2014, 01/19/2016, 01/23/2017, 01/04/2018   Influenza-Unspecified 03/11/2002, 02/19/2003, 01/24/2004, 02/23/2005, 01/23/2006, 04/03/2007, 02/21/2008, 01/23/2009, 02/23/2009, 12/24/2009, 02/12/2020   PFIZER(Purple Top)SARS-COV-2 Vaccination 06/28/2019, 07/19/2019, 09/18/2019, 03/24/2020   Pneumococcal Conjugate-13 11/27/2013, 12/25/2014   Pneumococcal Polysaccharide-23 04/17/2002   Pneumococcal-Unspecified 03/11/2002   Td 12/07/2001, 04/26/2005, 05/11/2005   Td,absorbed, Preservative Free, Adult Use, Lf Unspecified 10/25/2021   Tdap 12/08/2011, 10/25/2021   Tetanus 11/24/2011   Zoster Recombinant(Shingrix) 08/20/2020, 01/01/2021   Zoster, Live 05/17/2007   Pertinent  Health Maintenance Due  Topic Date Due   Colonoscopy  05/30/2023   Influenza Vaccine   07/23/2024 (Originally 11/24/2023)      02/13/2023    3:46 PM 08/21/2023    2:09 PM 12/08/2023   12:32 PM 04/09/2024    1:52 PM 04/19/2024   11:12 AM  Fall Risk  Falls in the past year? 1 1 1 1 1   Was there an injury with Fall? 1  0  0  1 1  Was there an injury with Fall? - Comments fell due to stroke       Fall Risk Category Calculator 2 2 1 2 2   Patient at Risk for Falls Due to No Fall Risks History of fall(s) Impaired mobility;History of fall(s) History of fall(s);Impaired balance/gait Impaired balance/gait;History of fall(s)  Fall risk Follow up Education provided;Falls prevention discussed  Falls evaluation completed Falls evaluation completed Falls evaluation completed Falls evaluation completed     Data saved with a previous flowsheet row definition   Functional Status Survey:    Vitals:   04/22/24 1038  BP: (!) 171/83  Pulse: 62  Resp: 20  Temp: (!) 96.6 F (35.9 C)  SpO2: 99%  Weight: 168 lb 6.4 oz (76.4 kg)  Height: 5' 10 (1.778 m)   Body mass index is 24.16 kg/m. Physical Exam Vitals reviewed.  Constitutional:      General: He is not in acute distress. HENT:     Head: Normocephalic.  Eyes:     General:        Right eye: No discharge.        Left eye: No discharge.  Cardiovascular:     Rate and Rhythm: Normal rate and regular rhythm.     Pulses: Normal pulses.     Heart sounds: Normal heart sounds.  Pulmonary:     Effort: Pulmonary effort is normal.     Breath sounds: Normal breath sounds.  Abdominal:     General: Bowel sounds are normal. There is no distension.     Palpations: Abdomen is soft.     Tenderness: There is no abdominal tenderness.  Musculoskeletal:     Cervical back: Neck supple.     Right lower leg: No edema.     Left lower leg: No edema.     Comments: Mild swelling to left knee, FROM  Skin:    General: Skin is warm.     Capillary Refill: Capillary refill takes less than 2 seconds.  Neurological:     General: No focal deficit  present.     Mental Status: He is alert. Mental status is at baseline.     Motor: Weakness present.     Gait: Gait abnormal.     Labs reviewed: Recent Labs    01/01/24 0936 04/02/24 0845 04/03/24 0114 04/11/24 0000  NA 139 140 138 140  K 3.4* 3.3* 3.7 3.7  CL 99 101 102 100  CO2 27 29 26  31*  GLUCOSE 104* 99 148*  --   BUN 14 14 14 17   CREATININE 0.88 0.77 1.03 0.6  CALCIUM  9.2 9.3 8.7* 8.8  MG  --  2.1  --   --    Recent Labs    12/28/23 0947 04/02/24 0845 04/03/24 0114  AST 25 28 40  ALT 13 15 16   ALKPHOS 63 62 53  BILITOT 0.7 1.6* 1.9*  PROT 7.2 7.1 7.0  ALBUMIN 4.0 3.9 3.7   Recent Labs    12/28/23 0947 01/01/24 0936 04/02/24 0845 04/03/24 0114 04/05/24 0236 04/11/24 0000  WBC 4.6   < > 7.5 10.6* 8.6 8.6  NEUTROABS 2.7  --   --   --   --  5,891.00  HGB 12.8*   < > 14.5 14.1 12.6* 12.5*  HCT 38.3*   < > 42.5 42.3 36.8* 38*  MCV 96.5   < > 99.3 98.1 98.9  --   PLT 153.0   < > 135* 147* 146* 281   < > = values in this interval not displayed.   Lab Results  Component Value Date   TSH 1.89 12/28/2023   Lab Results  Component Value Date   HGBA1C 5.2 04/03/2024   Lab Results  Component Value Date   CHOL 140 04/03/2024   HDL 70 04/03/2024   LDLCALC 62 04/03/2024   LDLDIRECT 88 07/27/2020  TRIG 41 04/03/2024   CHOLHDL 2.0 04/03/2024    Significant Diagnostic Results in last 30 days:  MR KNEE LEFT WO CONTRAST Result Date: 04/04/2024 CLINICAL DATA:  Clemens.  Left knee pain. EXAM: MRI OF THE LEFT KNEE WITHOUT CONTRAST TECHNIQUE: Multiplanar, multisequence MR imaging of the knee was performed. No intravenous contrast was administered. COMPARISON:  Radiographs and CT scan 04/02/2024 FINDINGS: MENISCI Medial meniscus: Horizontal cleavage and free edge tears involving the posterior horn and midbody junction region. Lateral meniscus:  Intact LIGAMENTS Cruciates:  Intact Collaterals:  Intact.  Mild MCL and pes anserine bursitis. CARTILAGE Patellofemoral:  Advanced degenerative chondrosis with full-thickness cartilage loss. Medial: Moderate degenerative chondrosis with cartilage thinning, fraying and fibrillation Lateral: Moderate degenerative chondrosis mainly involving the tibial articular cartilage. Joint: There is a very large joint effusion which is complicated by hematoma. There is also a long and thick ligamentum mucosa noted. Areas of synovial thickening are also noted. Popliteal Fossa: Moderate-sized leaking Baker's cyst also containing complex fluid, likely hematoma. Extensor Mechanism: The patella retinacular structures are intact and the quadriceps and patellar tendons are intact. Bones: There is a small subchondral impaction type fracture involving the lateral tibial plateau posteriorly with surrounding marrow edema. Small defects in the subchondral plate. No femur or fibular fracture. No patellar fracture. Other: Unremarkable knee musculature. IMPRESSION: 1. Small subchondral impaction type fracture involving the lateral tibial plateau posteriorly with surrounding marrow edema. 2. Horizontal cleavage and free edge tears involving the posterior horn and midbody junction region of the medial meniscus. 3. Intact ligamentous structures. 4. Very large joint effusion which is complicated by hematoma. There is also a long and thick ligamentum mucosa noted. Areas of synovial thickening are also noted. 5. Moderate-sized leaking Baker's cyst also containing complex fluid, likely hematoma. 6. Tricompartmental degenerative changes most significant in the patellofemoral joint. Electronically Signed   By: MYRTIS Stammer M.D.   On: 04/04/2024 21:07   ECHOCARDIOGRAM COMPLETE BUBBLE STUDY Result Date: 04/03/2024    ECHOCARDIOGRAM REPORT   Patient Name:   ANAKIN VARKEY Date of Exam: 04/03/2024 Medical Rec #:  [id number]           Height:       70.0 in Accession #:    [id number]          Weight:       169.8 lb Date of Birth:  06-Feb-1935           BSA:          1.947  m Patient Age:    89 years            BP:           150/74 mmHg Patient Gender: M                   HR:           58 bpm. Exam Location:  Inpatient Procedure: 2D Echo, Cardiac Doppler, Color Doppler and Saline Contrast Bubble            Study (Both Spectral and Color Flow Doppler were utilized during            procedure). Indications:    Stroke  History:        Patient has prior history of Echocardiogram examinations, most                 recent 10/26/2023. CAD, Arrythmias:Atrial Fibrillation; Risk  Factors:Hypertension, Dyslipidemia and Former Smoker.                 Aortic Valve: 29 mm Sapien prosthetic, stented (TAVR) valve is                 present in the aortic position. Procedure Date: 11/15/2022.  Sonographer:    Merlynn Argyle Referring Phys: [id number] MARSA NOVAK MELVIN  Sonographer Comments: Image acquisition challenging due to respiratory motion. IMPRESSIONS  1. Left ventricular ejection fraction, by estimation, is 45 to 50%. The left ventricle has mildly decreased function. The left ventricle demonstrates regional wall motion abnormalities (see scoring diagram/findings for description). The left ventricular  internal cavity size was mildly dilated. There is mild concentric left ventricular hypertrophy. Left ventricular diastolic parameters are indeterminate.  2. Right ventricular systolic function is normal. The right ventricular size is mildly enlarged. There is mildly elevated pulmonary artery systolic pressure. The estimated right ventricular systolic pressure is 37.4 mmHg.  3. Left atrial size was severely dilated.  4. The mitral valve is degenerative. Mild mitral valve regurgitation. No evidence of mitral stenosis. Severe mitral annular calcification.  5. Mild perivalvular leak. The aortic valve has been repaired/replaced. Aortic valve regurgitation is mild. No aortic stenosis is present. There is a 29 mm Sapien prosthetic (TAVR) valve present in the aortic position. Procedure Date:  11/15/2022. Echo findings are consistent with perivalvular leak of the aortic prosthesis.  6. Aortic dilatation noted. There is mild dilatation of the ascending aorta, measuring 42 mm.  7. The inferior vena cava is normal in size with greater than 50% respiratory variability, suggesting right atrial pressure of 3 mmHg.  8. Agitated saline contrast bubble study was negative, with no evidence of any interatrial shunt. Conclusion(s)/Recommendation(s): No intracardiac source of embolism detected on this transthoracic study. Consider a transesophageal echocardiogram to exclude cardiac source of embolism if clinically indicated. FINDINGS  Left Ventricle: Left ventricular ejection fraction, by estimation, is 45 to 50%. The left ventricle has mildly decreased function. The left ventricle demonstrates regional wall motion abnormalities. The left ventricular internal cavity size was mildly dilated. There is mild concentric left ventricular hypertrophy. Abnormal (paradoxical) septal motion, consistent with left bundle branch block. Left ventricular diastolic parameters are indeterminate.  LV Wall Scoring: The mid inferoseptal segment is hypokinetic. Right Ventricle: The right ventricular size is mildly enlarged. No increase in right ventricular wall thickness. Right ventricular systolic function is normal. There is mildly elevated pulmonary artery systolic pressure. The tricuspid regurgitant velocity is 2.71 m/s, and with an assumed right atrial pressure of 8 mmHg, the estimated right ventricular systolic pressure is 37.4 mmHg. Left Atrium: Left atrial size was severely dilated. Right Atrium: Right atrial size was normal in size. Pericardium: There is no evidence of pericardial effusion. Mitral Valve: The mitral valve is degenerative in appearance. Severe mitral annular calcification. Mild mitral valve regurgitation. No evidence of mitral valve stenosis. MV peak gradient, 5.0 mmHg. The mean mitral valve gradient is 2.0 mmHg.  Tricuspid Valve: The tricuspid valve is normal in structure. Tricuspid valve regurgitation is mild . No evidence of tricuspid stenosis. Aortic Valve: Mild perivalvular leak. The aortic valve has been repaired/replaced. Aortic valve regurgitation is mild. No aortic stenosis is present. Aortic valve mean gradient measures 8.0 mmHg. Aortic valve peak gradient measures 16.5 mmHg. Aortic valve area, by VTI measures 1.87 cm. There is a 29 mm Sapien prosthetic, stented (TAVR) valve present in the aortic position. Procedure Date: 11/15/2022. Pulmonic Valve: The pulmonic valve was normal  in structure. Pulmonic valve regurgitation is mild to moderate. No evidence of pulmonic stenosis. Aorta: Aortic dilatation noted. There is mild dilatation of the ascending aorta, measuring 42 mm. Venous: The inferior vena cava is normal in size with greater than 50% respiratory variability, suggesting right atrial pressure of 3 mmHg. IAS/Shunts: No atrial level shunt detected by color flow Doppler. Agitated saline contrast was given intravenously to evaluate for intracardiac shunting. Agitated saline contrast bubble study was negative, with no evidence of any interatrial shunt. Additional Comments: A device lead is visualized in the right ventricle.  LEFT VENTRICLE PLAX 2D LVIDd:         5.70 cm   Diastology LVIDs:         3.90 cm   LV e' medial: 6.98 cm/s LV PW:         1.20 cm LV IVS:        1.20 cm LVOT diam:     2.20 cm LV SV:         70 LV SV Index:   36 LVOT Area:     3.80 cm  RIGHT VENTRICLE             IVC RV Basal diam:  4.30 cm     IVC diam: 2.30 cm RV Mid diam:    3.40 cm RV S prime:     11.60 cm/s TAPSE (M-mode): 1.5 cm LEFT ATRIUM              Index        RIGHT ATRIUM           Index LA diam:        4.10 cm  2.11 cm/m   RA Area:     21.90 cm LA Vol (A2C):   121.0 ml 62.15 ml/m  RA Volume:   60.20 ml  30.92 ml/m LA Vol (A4C):   62.2 ml  31.95 ml/m LA Biplane Vol: 88.3 ml  45.36 ml/m  AORTIC VALVE AV Area (Vmax):    1.74  cm AV Area (Vmean):   1.89 cm AV Area (VTI):     1.87 cm AV Vmax:           203.25 cm/s AV Vmean:          130.500 cm/s AV VTI:            0.372 m AV Peak Grad:      16.5 mmHg AV Mean Grad:      8.0 mmHg LVOT Vmax:         93.00 cm/s LVOT Vmean:        64.950 cm/s LVOT VTI:          0.183 m LVOT/AV VTI ratio: 0.49  AORTA Ao Root diam: 3.90 cm Ao Asc diam:  4.20 cm MITRAL VALVE             TRICUSPID VALVE MV Area VTI:  2.45 cm   TR Peak grad:   29.4 mmHg MV Peak grad: 5.0 mmHg   TR Vmax:        271.00 cm/s MV Mean grad: 2.0 mmHg MV Vmax:      1.12 m/s   SHUNTS MV Vmean:     59.6 cm/s  Systemic VTI:  0.18 m                          Systemic Diam: 2.20 cm Oneil Parchment MD Electronically signed by Oneil Parchment MD Signature  Date/Time: 04/03/2024/3:34:13 PM    Final    CT ANGIO HEAD NECK W WO CM Result Date: 04/02/2024 EXAM: CTA HEAD AND NECK WITH AND WITHOUT 04/02/2024 09:07:20 PM TECHNIQUE: CTA of the head and neck was performed with and without the administration of intravenous contrast. Multiplanar 2D and/or 3D reformatted images are provided for review. Automated exposure control, iterative reconstruction, and/or weight based adjustment of the mA/kV was utilized to reduce the radiation dose to as low as reasonably achievable. Stenosis of the internal carotid arteries measured using NASCET criteria. COMPARISON: CTA head/neck from 01/09/2023 CLINICAL HISTORY: Stroke/TIA, determine embolic source FINDINGS: AORTIC ARCH AND ARCH VESSELS: No dissection or arterial injury. No significant stenosis of the brachiocephalic or subclavian arteries. CERVICAL CAROTID ARTERIES: No dissection, arterial injury, or hemodynamically significant stenosis by NASCET criteria. CERVICAL VERTEBRAL ARTERIES: No dissection, arterial injury, or significant stenosis. LUNGS AND MEDIASTINUM: Unremarkable. SOFT TISSUES: No acute abnormality. BONES: No acute abnormality. ANTERIOR CIRCULATION: No significant stenosis of the internal carotid  arteries. No significant stenosis of the anterior cerebral arteries. No significant stenosis of the middle cerebral arteries. No aneurysm. POSTERIOR CIRCULATION: No significant stenosis of the posterior cerebral arteries. No significant stenosis of the basilar artery. No significant stenosis of the vertebral arteries. No aneurysm. OTHER: No dural venous sinus thrombosis on this non-dedicated study. IMPRESSION: 1. No large vessel occlusion or hemodynamically significant stenosis. Electronically signed by: Gilmore Molt MD 04/02/2024 10:27 PM EST RP Workstation: [id number]   CT Knee Left Wo Contrast Result Date: 04/02/2024 CLINICAL DATA:  Fall.  Pain. EXAM: CT OF THE LEFT KNEE WITHOUT CONTRAST TECHNIQUE: Multidetector CT imaging of the left knee was performed according to the standard protocol. Multiplanar CT image reconstructions were also generated. RADIATION DOSE REDUCTION: This exam was performed according to the departmental dose-optimization program which includes automated exposure control, adjustment of the mA and/or kV according to patient size and/or use of iterative reconstruction technique. COMPARISON:  Left knee radiograph dated 01/02/2024. FINDINGS: Bones/Joint/Cartilage No acute fracture or dislocation. Moderate-to-large joint effusion. No evidence of lipohemarthrosis. Chondrocalcinosis in the medial and lateral femorotibial compartments. Tricompartmental osteoarthritis with mild joint space narrowing and osteophytosis. Ligaments Ligaments are suboptimally evaluated by CT. Muscles and Tendons A Baker's cyst is noted. Quadriceps and patellar tendons appear intact. Soft tissue No loculated fluid collection. Peripheral vascular calcifications are noted. IMPRESSION: 1. No acute fracture or dislocation. 2. Moderate-to-large knee joint effusion. No evidence of lipohemarthrosis. 3. Tricompartmental osteoarthritis chondrocalcinosis in the medial and lateral compartments. Electronically Signed   By:  Harrietta Sherry M.D.   On: 04/02/2024 13:39   MR BRAIN WO CONTRAST Result Date: 04/02/2024 CLINICAL DATA:  Syncope/presyncope EXAM: MRI HEAD WITHOUT CONTRAST TECHNIQUE: Multiplanar, multiecho pulse sequences of the brain and surrounding structures were obtained without intravenous contrast. COMPARISON:  August 02, 2023 FINDINGS: MRI brain: There is a punctate focus of restricted diffusion in the left frontal white matter and there are 2 punctate foci of restricted diffusion in the right frontal white matter. There are extensive and confluent foci of T2 hyperintensity in the cerebral white matter. These do not have restricted diffusion. The ventricles are normal. No mass lesion. There are normal flow signals in the carotid arteries and basilar artery. No significant bone marrow signal abnormality. No significant abnormality in the paranasal sinuses or soft tissues. IMPRESSION: 1. Punctate acute lacunar infarct in the left frontal white matter and 2 punctate acute lacunar infarcts in the right frontal white matter 2. Extensive chronic white matter disease Electronically Signed  By: Nancyann Burns M.D.   On: 04/02/2024 13:15   DG Chest 2 View Result Date: 04/02/2024 EXAM: 2 VIEW(S) XRAY OF THE CHEST 04/02/2024 08:03:00 AM COMPARISON: 04/03/2023 CLINICAL HISTORY: dizziness FINDINGS: LUNGS AND PLEURA: No focal pulmonary opacity. No pleural effusion. No pneumothorax. HEART AND MEDIASTINUM: Mild cardiomegaly. Aortic valve prosthesis noted. CABG noted. Left chest single lead pacemaker noted. Aortic arch calcifications. BONES AND SOFT TISSUES: Sternotomy wires noted. Thoracic spondylosis. No acute osseous abnormality. IMPRESSION: 1. No acute findings. 2. Mild cardiomegaly with aortic valve prosthesis, CABG, and left chest single lead pacemaker. Electronically signed by: Evalene Coho MD 04/02/2024 08:30 AM EST RP Workstation: [id number]   DG Hip Unilat W or Wo Pelvis 2-3 Views Left Result Date: 04/02/2024 CLINICAL  DATA:  Status post fall EXAM: DG HIP (WITH OR WITHOUT PELVIS) 3V LEFT COMPARISON:  None Available. FINDINGS: There is no evidence of hip fracture or dislocation. Mild degenerative changes of the hips. IMPRESSION: No acute fracture or dislocation. Mild degenerative changes of the hips. Electronically Signed   By: Limin  Xu M.D.   On: 04/02/2024 08:29   DG Knee Complete 4 Views Left Result Date: 04/02/2024 EXAM: 4 OR MORE VIEW(S) XRAY OF THE LEFT KNEE 04/02/2024 08:03:00 AM COMPARISON: None available. CLINICAL HISTORY: fall FINDINGS: BONES AND JOINTS: No acute fracture. No malalignment. There is tricompartmental osteoarthritis. There are calcifications present within the medial meniscus. There is a moderate suprapatellar bursal effusion. SOFT TISSUES: There are moderate atheromatous calcifications present. IMPRESSION: 1. 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 2. Moderate suprapatellar bursal effusion. Electronically signed by: Evalene Coho MD 04/02/2024 08:29 AM EST RP Workstation: [id number]   CT HEAD WO CONTRAST ( ) Result Date: 04/02/2024 EXAM: CT HEAD WITHOUT CONTRAST 04/02/2024 07:29:16 AM TECHNIQUE: CT of the head was performed without the administration of intravenous contrast. Automated exposure control, iterative reconstruction, and/or weight based adjustment of the mA/kV was utilized to reduce the radiation dose to as low as reasonably achievable. COMPARISON: CT of the head dated 01/09/2023. CLINICAL HISTORY: Syncope/presyncope, cerebrovascular cause suspected. FINDINGS: BRAIN AND VENTRICLES: No acute hemorrhage. No evidence of acute infarct. No hydrocephalus. No extra-axial collection. No mass effect or midline shift. There is age-related atrophy and moderate diffuse cerebral white matter disease. There are senescent calcifications within the basal ganglia bilaterally. There are atheromatous calcifications within the carotid siphons and vertebral arteries.  ORBITS: No acute abnormality. The patient is status post bilateral lens replacement. SINUSES: No acute abnormality. SOFT TISSUES AND SKULL: No acute soft tissue abnormality. No skull fracture. IMPRESSION: 1. No acute intracranial abnormality. 2. Age-related atrophy and moderate diffuse cerebral white matter disease. Electronically signed by: Evalene Coho MD 04/02/2024 07:32 AM EST RP Workstation: [id number]   CUP PACEART REMOTE DEVICE CHECK Result Date: 04/01/2024 PPM Scheduled remote reviewed. Normal device function.  Presenting rhythm: VP, PVCs. Next remote transmission per protocol. - CS, CVRS   Assessment/Plan 1. Closed fracture of lateral portion of left tibial plateau with routine healing, subsequent encounter (Primary) -  noted on MRI left knee  - orthopedics did not recommend surgery - arthrocentesis also performed due to swelling  - WBAT - cont Percocet - recommend increased ice or heat applications for pain  - cont PT/OT  2. Urinary retention - noted 12/26 - improved with Flomax   3. Anxiety - noted 12/27 & 12/28 - ativan  started by on call - do not recommend due to percocet use> falls risk  - discontinue Ativan  - start hydroxyzine  prn for increased anxiety -  12/15 zoloft  started   4. Reactive depression - see above - related to rehab admission and wife passed earlier this year - cont Zoloft      Family/ staff Communication: plan discussed with patient and nurse  Labs/tests ordered:  none        [1]  Allergies Allergen Reactions   Hydralazine  Hcl Other (See Comments)    Had severe headache all night.  Nightmares.  Drugged feeling.  Extreme dizziness   Ace Inhibitors Other (See Comments)    COUGH  Product containing angiotensin-converting enzyme inhibitor (product)   Zocor [Simvastatin] Other (See Comments)    MYALGIA   Nsaids Other (See Comments)    Would avoid given anticoagulation   Prednisone  Other (See Comments)    Prednisone  caused pt to have  elevated BP.   Cephalexin Rash   Chocolate Other (See Comments)    Headaches    Doxycycline  Other (See Comments)    Nausea and abd pain   Gabapentin  Other (See Comments)    Dizziness   Hycodan [Hydrocodone  Bit-Homatrop Mbr] Nausea And Vomiting   Oxycodone  Nausea Only    Pt states that he cannot take prescription pain medication   "

## 2024-04-26 ENCOUNTER — Encounter: Payer: Self-pay | Admitting: Orthopedic Surgery

## 2024-04-26 ENCOUNTER — Other Ambulatory Visit: Payer: Self-pay | Admitting: *Deleted

## 2024-04-26 ENCOUNTER — Non-Acute Institutional Stay: Payer: Self-pay | Admitting: Orthopedic Surgery

## 2024-04-26 DIAGNOSIS — F329 Major depressive disorder, single episode, unspecified: Secondary | ICD-10-CM

## 2024-04-26 DIAGNOSIS — S82122D Displaced fracture of lateral condyle of left tibia, subsequent encounter for closed fracture with routine healing: Secondary | ICD-10-CM

## 2024-04-26 DIAGNOSIS — R413 Other amnesia: Secondary | ICD-10-CM | POA: Diagnosis not present

## 2024-04-26 DIAGNOSIS — Z8673 Personal history of transient ischemic attack (TIA), and cerebral infarction without residual deficits: Secondary | ICD-10-CM

## 2024-04-26 NOTE — Patient Outreach (Signed)
" °  04/26/2024  Nandan Willems Monahan 05-Jul-1934 [id number]   Mr. Beckstrand resides in [facility]. [facility]F waiver utilized for admission to [facility].   Update from Alfonso [facility] Admissions Coordinator. Alfonso reports Mr. Schamp continues to work with therapy. Transition plans are pending progress.      Pablo Hurst, MSN, RN, BSN [place]  [facility] RN Care Manager Direct Dial: [phone]                "

## 2024-04-26 NOTE — Progress Notes (Signed)
 " Location:  Other [place].  Nursing Home Room Number: [facility] [id number] Place of Service:  SNF [phone]) Provider:  Greig Cluster, NP  PCP: Cleatus Arlyss RAMAN, MD  Patient Care Team: Cleatus Arlyss RAMAN, MD as PCP - General (Family Medicine) Inocencio Soyla Lunger, MD as PCP - Electrophysiology (Cardiology) Jeffrie Oneil BROCKS, MD as PCP - Cardiology (Cardiology) Fate Morna SAILOR, [facility] (Inactive) as Pharmacist (Pharmacist) [facility]  Extended Emergency Contact Information Primary Emergency Contact: [facility] Mobile Phone: [phone] Relation: Son Secondary Emergency Contact: [name],[name] Mobile Phone: [phone] Relation: Son  Code Status:  DNR Goals of care: Advanced Directive information    04/04/2024   11:37 AM  Advanced Directives  Does Patient Have a Medical Advance Directive? Yes  Type of Advance Directive Living will  Does patient want to make changes to medical advance directive? No - Patient declined     Chief Complaint  Patient presents with   Knee Pain    Knee Pain.     HPI:  Pt is a 89 y.o. male seen today for acute visit due to ongoing knee pain.   He currently resides on the rehab unit at [facility]. PMH: PAF, CAD s/p CABG x 5, aortic stenosis s/p TAVR 2024, CHB s/p PPM 03/2023, HTN, GERD, neuropathy, OA, bladder neoplasm and insomnia.   H/o left tibial plateau fracture due to mechanical fall> orthopedics consulted and did not recommend surgery. 12/07 arthrocentesis was performed. 12/15 he was started on Percocet due to increased knee pain. This past week he has c/o increased knee pain. 12/29 robaxin  was started. He was sitting cross legged in bed without complaining of pain last encounter. Nursing also reports he is sitting is odd positions and not complaining of pain. No recent falls or injury. He has not follow up with orthopedics.   Nursing staff reports repetitive speech and phrases. 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  Remains on Eliquis . Cognitive testing has not been performed.   12/15 started on Zoloft  for depression. No changes in mood.    Past Medical History:  Diagnosis Date   Acute CVA (cerebrovascular accident) (HCC) 04/02/2024   Arthritis    Atrial fibrillation, permanent (HCC)    Bladder neoplasm    Chronic cough    NO CARDIAC OR PULMONARY RELATED   Coronary artery disease CARDIOLOGIST-  DR KLEIN/ [facility]   a. s/p CABG 2004, b. LHC with patent grafts 07/2005, ejection fraction 50%;  c. Myoview 2/15: no ischemia, EF 61%   Cough 04/29/2009   Followed in Pulmonary clinic/ [facility]/ [place]   - Trial off cozaar  07/20/2012 >>>        Dry eyes    Dyslipidemia    Family history of colon cancer 10/31/2016   GERD (gastroesophageal reflux disease)    Hematuria 07/27/2020   Oct 13, 2003 Entered By: MARSA BOUCHARD Comment: s/p cystoscopy Willma Beat 2005, normal     History of adenomatous polyp of colon 10/31/2016   History of CVA (cerebrovascular accident) 03/10/2020   Stroke in the Left eye, and brain stem   History of CVA in adulthood 01/23/2023   HTN (hypertension)    PONV (postoperative nausea and vomiting)    From having surgery back in the 1950's   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 Amiodarone  Rx   S/P CABG x 5 2004   S/P TAVR (transcatheter aortic valve replacement) 11/15/2022   s/p TAVR with a 29mm Edwards S3UR via the TF  approach by Dr. Wonda & Maryjane.   Severe aortic stenosis    Shingles 12/08/2023   Past Surgical History:  Procedure Laterality Date   CARDIAC CATHETERIZATION  07-26-2005  DR GAMBLE   PRESERVED LVF/  EF 50%/ PATENT GRAFTS   CARDIAC CATHETERIZATION  03-04-2003  DR GAMBLE   SEVERE 3 VESSEL DISEASE   CARDIAC CATHETERIZATION  1991  DR [facility]   PAF/ FALSE POSITIVE STRESS TEST   CAROTID ENDARTERECTOMY Left 2022   CATARACT EXTRACTION W/ INTRAOCULAR LENS IMPLANT Right    CORONARY ARTERY BYPASS GRAFT  03-08-2003   DR [id number]   LIMA TO LAD/ SVG TO OM2/  SVG TO DIAGONAL / SVG TO PDA & PLA   CYSTOSCOPY WITH BIOPSY N/A 12/25/2012   Procedure: CYSTOSCOPY WITH BLADDER BIOPSY;  Surgeon: Donnice Gwenyth Brooks, MD;  Location[facility];  Service: Urology;  Laterality: N/A;   ELECTROPHYSIOLOGY STUDY  07-27-2005  DR DANELLE B[place]INGH[place]   MAPPING --   RESULT NONINDUCIBLE VT OR SVT/  DX RIGHT VENTRICULAR OUTFLOW TRACT VT AND RULES OUT MORE MALIGNANT CAUSES OF VT   ENDARTERECTOMY Left 07/08/2020   Procedure: LEFT CAROTID ENDARTERECTOMY;  Surgeon: Serene Gaile ORN, MD;  Location: [facility] OR;  Service: Vascular;  Laterality: Left;   INGUINAL HERNIA REPAIR Bilateral 1954  &  1990   INTRAOPERATIVE TRANSTHORACIC ECHOCARDIOGRAM N/A 11/15/2022   Procedure: INTRAOPERATIVE TRANSTHORACIC ECHOCARDIOGRAM;  Surgeon: Wonda Sharper, MD;  Location: [facility] INVASIVE CV LAB;  Service: Open Heart Surgery;  Laterality: N/A;   KNEE ARTHROSCOPY Right 1994   LEFT HEART CATH AND CORS/GRAFTS ANGIOGRAPHY N/A 10/14/2022   Procedure: LEFT HEART CATH AND CORS/GRAFTS ANGIOGRAPHY;  Surgeon: Wonda Sharper, MD;  Location: [facility] INVASIVE CV LAB;  Service: Cardiovascular;  Laterality: N/A;   LUMBAR DISC SURGERY  1999   L4 -- L5   MOHS SURGERY     by Dr. Lyle 2019   PACEMAKER IMPLANT N/A 04/03/2023   Procedure: PACEMAKER IMPLANT;  Surgeon: Fernande Elspeth BROCKS, MD;  Location: [facility] INVASIVE CV LAB;  Service: Cardiovascular;  Laterality: N/A;   REMOVAL VOCAL CORD POLYPS  1978   TONSILLECTOMY  AS CHILD   TRANSCATHETER AORTIC VALVE REPLACEMENT, TRANSFEMORAL N/A 11/15/2022   Procedure: Transcatheter Aortic Valve Replacement, Transfemoral;  Surgeon: Wonda Sharper, MD;  Location: [facility] INVASIVE CV LAB;  Service: Open Heart Surgery;  Laterality: N/A;   TRANSTHORACIC ECHOCARDIOGRAM  08/08/2011   MILD LVH/  EF 55-60%/  GRADE I DIASTOLIC DYSFUNCTION/ MILD AV STENOSIS    Allergies[1]  Outpatient Encounter Medications as of 04/26/2024  Medication Sig    acetaminophen  (TYLENOL ) 500 MG tablet Take 500-1,000 mg by mouth every 6 (six) hours as needed (pain.).   apixaban  (ELIQUIS ) 5 MG TABS tablet Take 1 tablet (5 mg total) by mouth 2 (two) times daily. Take evening dose starting 07/09/20   butalbital -acetaminophen -caffeine  (FIORICET ) 50-325-40 MG tablet Take 1 tablet by mouth 3 (three) times daily as needed for headache.   cetirizine  (ZYRTEC ) 10 MG tablet Take 1 tablet (10 mg total) by mouth daily.   hydrochlorothiazide  (HYDRODIURIL ) 25 MG tablet Take 1 tablet (25 mg total) by mouth daily.   hydrOXYzine  (ATARAX ) 25 MG tablet Take 25 mg by mouth daily as needed.   Iron , Ferrous Sulfate , 325 (65 Fe) MG TABS Take 325 mg by mouth daily.   latanoprost  (XALATAN ) 0.005 % ophthalmic solution Place 1 drop into both eyes at bedtime.   meclizine  (ANTIVERT ) 25 MG tablet Take 25 mg by mouth every 6 (six) hours as needed  for dizziness.   methocarbamol  (ROBAXIN ) 500 MG tablet Take 500 mg by mouth every 8 (eight) hours as needed for muscle spasms.   oxyCODONE -acetaminophen  (PERCOCET/ROXICET) 5-325 MG tablet Take 1 tablet by mouth every 6 (six) hours as needed for severe pain (pain score 7-10).   Polyethyl Glycol-Propyl Glycol (SYSTANE ULTRA OP) Place 1-2 drops into both eyes once as needed (dry eyes.).   polyethylene glycol (MIRALAX ) 17 g packet Take 17 g by mouth daily.   potassium chloride  (KLOR-CON  M) 10 MEQ tablet Take 10 mEq by mouth in the morning.   pravastatin  (PRAVACHOL ) 80 MG tablet Take 1 tablet (80 mg total) by mouth every evening.   senna-docusate (SENOKOT-S) 8.6-50 MG tablet Take 1 tablet by mouth at bedtime as needed for mild constipation or moderate constipation.   sertraline  (ZOLOFT ) 50 MG tablet Take 50 mg by mouth at bedtime.   [DISCONTINUED] methocarbamol  (ROBAXIN ) 500 MG tablet Take 250 mg by mouth every 8 (eight) hours as needed for muscle spasms.   tamsulosin  (FLOMAX ) 0.4 MG CAPS capsule Take 1 capsule (0.4 mg total) by mouth daily. (Patient not  taking: Reported on 04/26/2024)   [DISCONTINUED] sertraline  (ZOLOFT ) 25 MG tablet Take 1 tablet (25 mg total) by mouth daily.   No facility-administered encounter medications on file as of 04/26/2024.    Review of Systems  Unable to perform ROS: Other (memory impairment)    Immunization History  Administered Date(s) Administered   Fluad Quad(high Dose 65+) 12/28/2018, 02/17/2020   Fluad Trivalent(High Dose 65+) 01/23/2023   INFLUENZA, HIGH DOSE SEASONAL PF 12/25/2015, 02/19/2017, 01/28/2021   Influenza Split 03/15/2011, 01/19/2012   Influenza Whole 03/11/2002, 02/24/2009, 01/14/2010   Influenza,inj,Quad PF,6+ Mos 01/16/2013, 01/22/2014, 12/25/2014, 01/19/2016, 01/23/2017, 01/04/2018   Influenza-Unspecified 03/11/2002, 02/19/2003, 01/24/2004, 02/23/2005, 01/23/2006, 04/03/2007, 02/21/2008, 01/23/2009, 02/23/2009, 12/24/2009, 02/12/2020   PFIZER(Purple Top)SARS-COV-2 Vaccination 06/28/2019, 07/19/2019, 09/18/2019, 03/24/2020   Pneumococcal Conjugate-13 11/27/2013, 12/25/2014   Pneumococcal Polysaccharide-23 04/17/2002   Pneumococcal-Unspecified 03/11/2002   Td 12/07/2001, 04/26/2005, 05/11/2005   Td,absorbed, Preservative Free, Adult Use, Lf Unspecified 10/25/2021   Tdap 12/08/2011, 10/25/2021   Tetanus 11/24/2011   Zoster Recombinant(Shingrix) 08/20/2020, 01/01/2021   Zoster, Live 05/17/2007   Pertinent  Health Maintenance Due  Topic Date Due   Colonoscopy  05/30/2023   Influenza Vaccine  07/23/2024 (Originally 11/24/2023)      02/13/2023    3:46 PM 08/21/2023    2:09 PM 12/08/2023   12:32 PM 04/09/2024    1:52 PM 04/19/2024   11:12 AM  Fall Risk  Falls in the past year? 1 1 1 1 1   Was there an injury with Fall? 1  0  0  1 1  Was there an injury with Fall? - Comments fell due to stroke       Fall Risk Category Calculator 2 2 1 2 2   Patient at Risk for Falls Due to No Fall Risks History of fall(s) Impaired mobility;History of fall(s) History of fall(s);Impaired balance/gait  Impaired balance/gait;History of fall(s)  Fall risk Follow up Education provided;Falls prevention discussed Falls evaluation completed Falls evaluation completed Falls evaluation completed Falls evaluation completed     Data saved with a previous flowsheet row definition   Functional Status Survey:    Vitals:   04/26/24 0954  BP: (!) 183/81  Pulse: (!) 57  Resp: 20  Temp: 97.8 F (36.6 C)  SpO2: 96%  Weight: 165 lb 12.8 oz (75.2 kg)  Height: 5' 10 (1.778 m)   Body mass index is 23.79 kg/m. Physical  Exam Vitals reviewed.  Constitutional:      General: He is not in acute distress. HENT:     Head: Normocephalic.  Eyes:     General:        Right eye: No discharge.        Left eye: No discharge.  Cardiovascular:     Rate and Rhythm: Normal rate and regular rhythm.     Pulses: Normal pulses.     Heart sounds: Normal heart sounds.  Pulmonary:     Effort: Pulmonary effort is normal.     Breath sounds: Normal breath sounds.  Abdominal:     General: Bowel sounds are normal. There is no distension.     Palpations: Abdomen is soft.     Tenderness: There is no abdominal tenderness.  Musculoskeletal:     Cervical back: Neck supple.     Right lower leg: No edema.     Left lower leg: No edema.  Skin:    General: Skin is warm.     Capillary Refill: Capillary refill takes less than 2 seconds.  Neurological:     General: No focal deficit present.     Mental Status: He is alert. Mental status is at baseline.     Gait: Gait abnormal.  Psychiatric:        Mood and Affect: Mood normal.     Comments: Repetitive speech, alert to self/person/place     Labs reviewed: Recent Labs    01/01/24 0936 04/02/24 0845 04/03/24 0114 04/11/24 0000 04/22/24 0000  NA 139 140 138 140 139  K 3.4* 3.3* 3.7 3.7 3.4*  CL 99 101 102 100 100  CO2 27 29 26  31* 30*  GLUCOSE 104* 99 148*  --   --   BUN 14 14 14 17 13   CREATININE 0.88 0.77 1.03 0.6 0.7  CALCIUM  9.2 9.3 8.7* 8.8 8.7  MG  --   2.1  --   --   --    Recent Labs    12/28/23 0947 04/02/24 0845 04/03/24 0114  AST 25 28 40  ALT 13 15 16   ALKPHOS 63 62 53  BILITOT 0.7 1.6* 1.9*  PROT 7.2 7.1 7.0  ALBUMIN 4.0 3.9 3.7   Recent Labs    12/28/23 0947 01/01/24 0936 04/02/24 0845 04/03/24 0114 04/05/24 0236 04/11/24 0000  WBC 4.6   < > 7.5 10.6* 8.6 8.6  NEUTROABS 2.7  --   --   --   --  5,891.00  HGB 12.8*   < > 14.5 14.1 12.6* 12.5*  HCT 38.3*   < > 42.5 42.3 36.8* 38*  MCV 96.5   < > 99.3 98.1 98.9  --   PLT 153.0   < > 135* 147* 146* 281   < > = values in this interval not displayed.   Lab Results  Component Value Date   TSH 1.89 12/28/2023   Lab Results  Component Value Date   HGBA1C 5.2 04/03/2024   Lab Results  Component Value Date   CHOL 140 04/03/2024   HDL 70 04/03/2024   LDLCALC 62 04/03/2024   LDLDIRECT 88 07/27/2020   TRIG 41 04/03/2024   CHOLHDL 2.0 04/03/2024    Significant Diagnostic Results in last 30 days:  MR KNEE LEFT WO CONTRAST Result Date: 04/04/2024 CLINICAL DATA:  [place]lem[place].  Left knee pain. EXAM: MRI OF THE LEFT KNEE WITHOUT CONTRAST TECHNIQUE: Multiplanar, multisequence MR imaging of the knee was performed. No intravenous contrast was administered. COMPARISON:  Radiographs and CT scan 04/02/2024 FINDINGS: MENISCI Medial meniscus: Horizontal cleavage and free edge tears involving the posterior horn and midbody junction region. Lateral meniscus:  Intact LIGAMENTS Cruciates:  Intact Collaterals:  Intact.  Mild MCL and pes anserine bursitis. CARTILAGE Patellofemoral: Advanced degenerative chondrosis with full-thickness cartilage loss. Medial: Moderate degenerative chondrosis with cartilage thinning, fraying and fibrillation Lateral: Moderate degenerative chondrosis mainly involving the tibial articular cartilage. Joint: There is a very large joint effusion which is complicated by hematoma. There is also a long and thick ligamentum mucosa noted. Areas of synovial thickening are also  noted. Popliteal Fossa: Moderate-sized leaking Baker's cyst also containing complex fluid, likely hematoma. Extensor Mechanism: The patella retinacular structures are intact and the quadriceps and patellar tendons are intact. Bones: There is a small subchondral impaction type fracture involving the lateral tibial plateau posteriorly with surrounding marrow edema. Small defects in the subchondral plate. No femur or fibular fracture. No patellar fracture. Other: Unremarkable knee musculature. IMPRESSION: 1. Small subchondral impaction type fracture involving the lateral tibial plateau posteriorly with surrounding marrow edema. 2. Horizontal cleavage and free edge tears involving the posterior horn and midbody junction region of the medial meniscus. 3. Intact ligamentous structures. 4. Very large joint effusion which is complicated by hematoma. There is also a long and thick ligamentum mucosa noted. Areas of synovial thickening are also noted. 5. Moderate-sized leaking Baker's cyst also containing complex fluid, likely hematoma. 6. Tricompartmental degenerative changes most significant in the patellofemoral joint. Electronically Signed   By: MYRTIS Stammer M.D.   On: 04/04/2024 21:07   ECHOCARDIOGRAM COMPLETE BUBBLE STUDY Result Date: 04/03/2024    ECHOCARDIOGRAM REPORT   Patient Name:   SEBASTIN PERLMUTTER Date of Exam: 04/03/2024 Medical Rec #:  [id number]           Height:       70.0 in Accession #:    [id number]          Weight:       169.8 lb Date of Birth:  [birth year]           BSA:          1.947 m Patient Age:    89 years            BP:           150/74 mmHg Patient Gender: M                   HR:           58 bpm. Exam Location:  Inpatient Procedure: 2D Echo, Cardiac Doppler, Color Doppler and Saline Contrast Bubble            Study (Both Spectral and Color Flow Doppler were utilized during            procedure). Indications:    Stroke  History:        Patient has prior history of Echocardiogram  examinations, most                 recent 10/26/2023. CAD, Arrythmias:Atrial Fibrillation; Risk                 Factors:Hypertension, Dyslipidemia and Former Smoker.                 Aortic Valve: 29 mm Sapien prosthetic, stented (TAVR) valve is                 present in the aortic position. Procedure Date: 11/15/2022.  Sonographer:    Merlynn Argyle Referring Phys: [id number] MARSA NOVAK MELVIN  Sonographer Comments: Image acquisition challenging due to respiratory motion. IMPRESSIONS  1. Left ventricular ejection fraction, by estimation, is 45 to 50%. The left ventricle has mildly decreased function. The left ventricle demonstrates regional wall motion abnormalities (see scoring diagram/findings for description). The left ventricular  internal cavity size was mildly dilated. There is mild concentric left ventricular hypertrophy. Left ventricular diastolic parameters are indeterminate.  2. Right ventricular systolic function is normal. The right ventricular size is mildly enlarged. There is mildly elevated pulmonary artery systolic pressure. The estimated right ventricular systolic pressure is 37.4 mmHg.  3. Left atrial size was severely dilated.  4. The mitral valve is degenerative. Mild mitral valve regurgitation. No evidence of mitral stenosis. Severe mitral annular calcification.  5. Mild perivalvular leak. The aortic valve has been repaired/replaced. Aortic valve regurgitation is mild. No aortic stenosis is present. There is a 29 mm Sapien prosthetic (TAVR) valve present in the aortic position. Procedure Date: 11/15/2022. Echo findings are consistent with perivalvular leak of the aortic prosthesis.  6. Aortic dilatation noted. There is mild dilatation of the ascending aorta, measuring 42 mm.  7. The inferior vena cava is normal in size with greater than 50% respiratory variability, suggesting right atrial pressure of 3 mmHg.  8. Agitated saline contrast bubble study was negative, with no evidence of any interatrial  shunt. Conclusion(s)/Recommendation(s): No intracardiac source of embolism detected on this transthoracic study. Consider a transesophageal echocardiogram to exclude cardiac source of embolism if clinically indicated. FINDINGS  Left Ventricle: Left ventricular ejection fraction, by estimation, is 45 to 50%. The left ventricle has mildly decreased function. The left ventricle demonstrates regional wall motion abnormalities. The left ventricular internal cavity size was mildly dilated. There is mild concentric left ventricular hypertrophy. Abnormal (paradoxical) septal motion, consistent with left bundle branch block. Left ventricular diastolic parameters are indeterminate.  LV Wall Scoring: The mid inferoseptal segment is hypokinetic. Right Ventricle: The right ventricular size is mildly enlarged. No increase in right ventricular wall thickness. Right ventricular systolic function is normal. There is mildly elevated pulmonary artery systolic pressure. The tricuspid regurgitant velocity is 2.71 m/s, and with an assumed right atrial pressure of 8 mmHg, the estimated right ventricular systolic pressure is 37.4 mmHg. Left Atrium: Left atrial size was severely dilated. Right Atrium: Right atrial size was normal in size. Pericardium: There is no evidence of pericardial effusion. Mitral Valve: The mitral valve is degenerative in appearance. Severe mitral annular calcification. Mild mitral valve regurgitation. No evidence of mitral valve stenosis. MV peak gradient, 5.0 mmHg. The mean mitral valve gradient is 2.0 mmHg. Tricuspid Valve: The tricuspid valve is normal in structure. Tricuspid valve regurgitation is mild . No evidence of tricuspid stenosis. Aortic Valve: Mild perivalvular leak. The aortic valve has been repaired/replaced. Aortic valve regurgitation is mild. No aortic stenosis is present. Aortic valve mean gradient measures 8.0 mmHg. Aortic valve peak gradient measures 16.5 mmHg. Aortic valve area, by VTI measures  1.87 cm. There is a 29 mm Sapien prosthetic, stented (TAVR) valve present in the aortic position. Procedure Date: 11/15/2022. Pulmonic Valve: The pulmonic valve was normal in structure. Pulmonic valve regurgitation is mild to moderate. No evidence of pulmonic stenosis. Aorta: Aortic dilatation noted. There is mild dilatation of the ascending aorta, measuring 42 mm. Venous: The inferior vena cava is normal in size with greater than 50% respiratory variability, suggesting right atrial pressure of 3 mmHg. IAS/Shunts: No atrial level shunt  detected by color flow Doppler. Agitated saline contrast was given intravenously to evaluate for intracardiac shunting. Agitated saline contrast bubble study was negative, with no evidence of any interatrial shunt. Additional Comments: A device lead is visualized in the right ventricle.  LEFT VENTRICLE PLAX 2D LVIDd:         5.70 cm   Diastology LVIDs:         3.90 cm   LV e' medial: 6.98 cm/s LV PW:         1.20 cm LV IVS:        1.20 cm LVOT diam:     2.20 cm LV SV:         70 LV SV Index:   36 LVOT Area:     3.80 cm  RIGHT VENTRICLE             IVC RV Basal diam:  4.30 cm     IVC diam: 2.30 cm RV Mid diam:    3.40 cm RV S prime:     11.60 cm/s TAPSE (M-mode): 1.5 cm LEFT ATRIUM              Index        RIGHT ATRIUM           Index LA diam:        4.10 cm  2.11 cm/m   RA Area:     21.90 cm LA Vol (A2C):   121.0 ml 62.15 ml/m  RA Volume:   60.20 ml  30.92 ml/m LA Vol (A4C):   62.2 ml  31.95 ml/m LA Biplane Vol: 88.3 ml  45.36 ml/m  AORTIC VALVE AV Area (Vmax):    1.74 cm AV Area (Vmean):   1.89 cm AV Area (VTI):     1.87 cm AV Vmax:           203.25 cm/s AV Vmean:          130.500 cm/s AV VTI:            0.372 m AV Peak Grad:      16.5 mmHg AV Mean Grad:      8.0 mmHg LVOT Vmax:         93.00 cm/s LVOT Vmean:        64.950 cm/s LVOT VTI:          0.183 m LVOT/AV VTI ratio: 0.49  AORTA Ao Root diam: 3.90 cm Ao Asc diam:  4.20 cm MITRAL VALVE             TRICUSPID VALVE MV  Area VTI:  2.45 cm   TR Peak grad:   29.4 mmHg MV Peak grad: 5.0 mmHg   TR Vmax:        271.00 cm/s MV Mean grad: 2.0 mmHg MV Vmax:      1.12 m/s   SHUNTS MV Vmean:     59.6 cm/s  Systemic VTI:  0.18 m                          Systemic Diam: 2.20 cm Oneil Parchment MD Electronically signed by Oneil Parchment MD Signature Date/Time: 04/03/2024/3:34:13 PM    Final    CT ANGIO HEAD NECK W WO CM Result Date: 04/02/2024 EXAM: CTA HEAD AND NECK WITH AND WITHOUT 04/02/2024 09:07:20 PM TECHNIQUE: CTA of the head and neck was performed with and without the administration of intravenous contrast. Multiplanar 2D and/or 3D reformatted images are provided  for review. Automated exposure control, iterative reconstruction, and/or weight based adjustment of the mA/kV was utilized to reduce the radiation dose to as low as reasonably achievable. Stenosis of the internal carotid arteries measured using NASCET criteria. COMPARISON: CTA head/neck from 01/09/2023 CLINICAL HISTORY: Stroke/TIA, determine embolic source FINDINGS: AORTIC ARCH AND ARCH VESSELS: No dissection or arterial injury. No significant stenosis of the brachiocephalic or subclavian arteries. CERVICAL CAROTID ARTERIES: No dissection, arterial injury, or hemodynamically significant stenosis by NASCET criteria. CERVICAL VERTEBRAL ARTERIES: No dissection, arterial injury, or significant stenosis. LUNGS AND MEDIASTINUM: Unremarkable. SOFT TISSUES: No acute abnormality. BONES: No acute abnormality. ANTERIOR CIRCULATION: No significant stenosis of the internal carotid arteries. No significant stenosis of the anterior cerebral arteries. No significant stenosis of the middle cerebral arteries. No aneurysm. POSTERIOR CIRCULATION: No significant stenosis of the posterior cerebral arteries. No significant stenosis of the basilar artery. No significant stenosis of the vertebral arteries. No aneurysm. OTHER: No dural venous sinus thrombosis on this non-dedicated study. IMPRESSION: 1. No  large vessel occlusion or hemodynamically significant stenosis. Electronically signed by: Gilmore Molt MD 04/02/2024 10:27 PM EST RP Workstation: [id number]   CT Knee Left Wo Contrast Result Date: 04/02/2024 CLINICAL DATA:  Fall.  Pain. EXAM: CT OF THE LEFT KNEE WITHOUT CONTRAST TECHNIQUE: Multidetector CT imaging of the left knee was performed according to the standard protocol. Multiplanar CT image reconstructions were also generated. RADIATION DOSE REDUCTION: This exam was performed according to the departmental dose-optimization program which includes automated exposure control, adjustment of the mA and/or kV according to patient size and/or use of iterative reconstruction technique. COMPARISON:  Left knee radiograph dated 01/02/2024. FINDINGS: Bones/Joint/Cartilage No acute fracture or dislocation. Moderate-to-large joint effusion. No evidence of lipohemarthrosis. Chondrocalcinosis in the medial and lateral femorotibial compartments. Tricompartmental osteoarthritis with mild joint space narrowing and osteophytosis. Ligaments Ligaments are suboptimally evaluated by CT. Muscles and Tendons A Baker's cyst is noted. Quadriceps and patellar tendons appear intact. Soft tissue No loculated fluid collection. Peripheral vascular calcifications are noted. IMPRESSION: 1. No acute fracture or dislocation. 2. Moderate-to-large knee joint effusion. No evidence of lipohemarthrosis. 3. Tricompartmental osteoarthritis chondrocalcinosis in the medial and lateral compartments. Electronically Signed   By: Harrietta Sherry M.D.   On: 04/02/2024 13:39   MR BRAIN WO CONTRAST Result Date: 04/02/2024 CLINICAL DATA:  Syncope/presyncope EXAM: MRI HEAD WITHOUT CONTRAST TECHNIQUE: Multiplanar, multiecho pulse sequences of the brain and surrounding structures were obtained without intravenous contrast. COMPARISON:  August 02, 2023 FINDINGS: MRI brain: There is a punctate focus of restricted diffusion in the left frontal white matter  and there are 2 punctate foci of restricted diffusion in the right frontal white matter. There are extensive and confluent foci of T2 hyperintensity in the cerebral white matter. These do not have restricted diffusion. The ventricles are normal. No mass lesion. There are normal flow signals in the carotid arteries and basilar artery. No significant bone marrow signal abnormality. No significant abnormality in the paranasal sinuses or soft tissues. IMPRESSION: 1. Punctate acute lacunar infarct in the left frontal white matter and 2 punctate acute lacunar infarcts in the right frontal white matter 2. Extensive chronic white matter disease Electronically Signed   By: Nancyann Burns M.D.   On: 04/02/2024 13:15   DG Chest 2 View Result Date: 04/02/2024 EXAM: 2 VIEW(S) XRAY OF THE CHEST 04/02/2024 08:03:00 AM COMPARISON: 04/03/2023 CLINICAL HISTORY: dizziness FINDINGS: LUNGS AND PLEURA: No focal pulmonary opacity. No pleural effusion. No pneumothorax. HEART AND MEDIASTINUM: Mild cardiomegaly. Aortic valve prosthesis noted.  CABG noted. Left chest single lead pacemaker noted. Aortic arch calcifications. BONES AND SOFT TISSUES: Sternotomy wires noted. Thoracic spondylosis. No acute osseous abnormality. IMPRESSION: 1. No acute findings. 2. Mild cardiomegaly with aortic valve prosthesis, CABG, and left chest single lead pacemaker. Electronically signed by: Evalene Coho MD 04/02/2024 08:30 AM EST RP Workstation: [id number]   DG Hip Unilat W or Wo Pelvis 2-3 Views Left Result Date: 04/02/2024 CLINICAL DATA:  Status post fall EXAM: DG HIP (WITH OR WITHOUT PELVIS) 3V LEFT COMPARISON:  None Available. FINDINGS: There is no evidence of hip fracture or dislocation. Mild degenerative changes of the hips. IMPRESSION: No acute fracture or dislocation. Mild degenerative changes of the hips. Electronically Signed   By: Limin  Xu M.D.   On: 04/02/2024 08:29   DG Knee Complete 4 Views Left Result Date: 04/02/2024 EXAM: 4 OR MORE  VIEW(S) XRAY OF THE LEFT KNEE 04/02/2024 08:03:00 AM COMPARISON: None available. CLINICAL HISTORY: fall FINDINGS: BONES AND JOINTS: No acute fracture. No malalignment. There is tricompartmental osteoarthritis. There are calcifications present within the medial meniscus. There is a moderate suprapatellar bursal effusion. SOFT TISSUES: There are moderate atheromatous calcifications present. IMPRESSION: 1. 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 2. Moderate suprapatellar bursal effusion. Electronically signed by: Evalene Coho MD 04/02/2024 08:29 AM EST RP Workstation: [id number]   CT HEAD WO CONTRAST ( ) Result Date: 04/02/2024 EXAM: CT HEAD WITHOUT CONTRAST 04/02/2024 07:29:16 AM TECHNIQUE: CT of the head was performed without the administration of intravenous contrast. Automated exposure control, iterative reconstruction, and/or weight based adjustment of the mA/kV was utilized to reduce the radiation dose to as low as reasonably achievable. COMPARISON: CT of the head dated 01/09/2023. CLINICAL HISTORY: Syncope/presyncope, cerebrovascular cause suspected. FINDINGS: BRAIN AND VENTRICLES: No acute hemorrhage. No evidence of acute infarct. No hydrocephalus. No extra-axial collection. No mass effect or midline shift. There is age-related atrophy and moderate diffuse cerebral white matter disease. There are senescent calcifications within the basal ganglia bilaterally. There are atheromatous calcifications within the carotid siphons and vertebral arteries. ORBITS: No acute abnormality. The patient is status post bilateral lens replacement. SINUSES: No acute abnormality. SOFT TISSUES AND SKULL: No acute soft tissue abnormality. No skull fracture. IMPRESSION: 1. No acute intracranial abnormality. 2. Age-related atrophy and moderate diffuse cerebral white matter disease. Electronically signed by: Evalene Coho MD 04/02/2024 07:32 AM EST RP Workstation: [id number]   CUP  PACEART REMOTE DEVICE CHECK Result Date: 04/01/2024 PPM Scheduled remote reviewed. Normal device function.  Presenting rhythm: VP, PVCs. Next remote transmission per protocol. - CS, CVRS   Assessment/Plan 1. Closed fracture of lateral portion of left tibial plateau with routine healing, subsequent encounter (Primary) - noted on MRI left knee  - orthopedics did not recommend surgery - arthrocentesis also performed due to swelling  - WBAT - continues to c/o pain> ? Pain versus memory impairment  - cont Percocet  - will increase robaxin  to 500 mg TID PRN - discussed not crossing legs in bed  - cont PT/OT  2. Memory impairment - repetitive speech, worsening mood - ? Depression, memory impairment - ST evaluation for cognitive testing  3. Reactive depression - 12/15 started on Zoloft  - no improvement - will increase to 50 mg daily  - bmp in 2 weeks  4. History of CVA (cerebrovascular accident) - MRI noted acute lacunar infarct in left and right frontal white matter - h/o CVA 12/2022 - cont Eliquis        Family/ staff Communication: plan discussed with patient  and nurse  Labs/tests ordered:  bmp in 2 weeks        [1]  Allergies Allergen Reactions   Hydralazine  Hcl Other (See Comments)    Had severe headache all night.  Nightmares.  Drugged feeling.  Extreme dizziness   Ace Inhibitors Other (See Comments)    COUGH  Product containing angiotensin-converting enzyme inhibitor (product)   Zocor [Simvastatin] Other (See Comments)    MYALGIA   Nsaids Other (See Comments)    Would avoid given anticoagulation   Prednisone  Other (See Comments)    Prednisone  caused pt to have elevated BP.   Cephalexin Rash   Chocolate Other (See Comments)    Headaches    Doxycycline  Other (See Comments)    Nausea and abd pain   Gabapentin  Other (See Comments)    Dizziness   Hycodan [Hydrocodone  Bit-Homatrop Mbr] Nausea And Vomiting   Oxycodone  Nausea Only    Pt states that he cannot  take prescription pain medication   "

## 2024-04-29 LAB — BASIC METABOLIC PANEL WITH GFR
BUN: 17 (ref 4–21)
CO2: 34 — AB (ref 13–22)
Chloride: 98 — AB (ref 99–108)
Creatinine: 0.7 (ref 0.6–1.3)
Glucose: 99
Potassium: 3.2 meq/L — AB (ref 3.5–5.1)
Sodium: 139 (ref 137–147)

## 2024-04-29 LAB — COMPREHENSIVE METABOLIC PANEL WITH GFR
Calcium: 8.8 (ref 8.7–10.7)
eGFR: 91

## 2024-05-01 ENCOUNTER — Other Ambulatory Visit: Payer: Self-pay | Admitting: Nurse Practitioner

## 2024-05-01 ENCOUNTER — Other Ambulatory Visit: Payer: Self-pay | Admitting: Orthopedic Surgery

## 2024-05-01 MED ORDER — OXYCODONE-ACETAMINOPHEN 5-325 MG PO TABS: 1.0000 | ORAL_TABLET | Freq: Four times a day (QID) | ORAL | 0 refills | Status: DC | PRN

## 2024-05-06 ENCOUNTER — Encounter: Payer: Self-pay | Admitting: Orthopedic Surgery

## 2024-05-06 ENCOUNTER — Non-Acute Institutional Stay (SKILLED_NURSING_FACILITY): Payer: Self-pay | Admitting: Orthopedic Surgery

## 2024-05-06 DIAGNOSIS — Z8673 Personal history of transient ischemic attack (TIA), and cerebral infarction without residual deficits: Secondary | ICD-10-CM | POA: Diagnosis not present

## 2024-05-06 DIAGNOSIS — I4821 Permanent atrial fibrillation: Secondary | ICD-10-CM

## 2024-05-06 DIAGNOSIS — R339 Retention of urine, unspecified: Secondary | ICD-10-CM | POA: Diagnosis not present

## 2024-05-06 DIAGNOSIS — S82122D Displaced fracture of lateral condyle of left tibia, subsequent encounter for closed fracture with routine healing: Secondary | ICD-10-CM

## 2024-05-06 DIAGNOSIS — F329 Major depressive disorder, single episode, unspecified: Secondary | ICD-10-CM

## 2024-05-06 DIAGNOSIS — R413 Other amnesia: Secondary | ICD-10-CM | POA: Diagnosis not present

## 2024-05-06 DIAGNOSIS — I10 Essential (primary) hypertension: Secondary | ICD-10-CM

## 2024-05-06 DIAGNOSIS — Z952 Presence of prosthetic heart valve: Secondary | ICD-10-CM

## 2024-05-06 LAB — BASIC METABOLIC PANEL WITH GFR
BUN: 16 (ref 4–21)
CO2: 31 — AB (ref 13–22)
Chloride: 98 — AB (ref 99–108)
Creatinine: 0.8 (ref 0.6–1.3)
Glucose: 99
Potassium: 3.5 meq/L (ref 3.5–5.1)
Sodium: 138 (ref 137–147)

## 2024-05-06 LAB — COMPREHENSIVE METABOLIC PANEL WITH GFR
Calcium: 8.8 (ref 8.7–10.7)
eGFR: 87

## 2024-05-06 NOTE — Progress Notes (Unsigned)
 " Location:  Other [place].  Nursing Home Room Number: [facility] [id number] Place of Service:  SNF [phone]) Provider:  Greig Cluster, NP  PCP: Cleatus Arlyss RAMAN, MD  Patient Care Team: Cleatus Arlyss RAMAN, MD as PCP - General (Family Medicine) Inocencio Soyla Lunger, MD as PCP - Electrophysiology (Cardiology) Jeffrie Oneil BROCKS, MD as PCP - Cardiology (Cardiology) Fate Morna SAILOR, [facility] (Inactive) as Pharmacist (Pharmacist) [facility]  Extended Emergency Contact Information Primary Emergency Contact: [facility] Mobile Phone: [phone] Relation: Son Secondary Emergency Contact: [name],[name] Mobile Phone: [phone] Relation: Son  Code Status:  DNR Goals of care: Advanced Directive information    05/06/2024    1:19 PM  Advanced Directives  Does Patient Have a Medical Advance Directive? Yes  Type of Advance Directive Out of facility DNR (pink MOST or yellow form)  Does patient want to make changes to medical advance directive? No - Patient declined     Chief Complaint  Patient presents with   Medical Management of Chronic Issues    Medical Management of Chronic Issues.     HPI:  Pt is a 89 y.o. male seen today for medical management of chronic diseases.    He currently resides on the rehab unit at [facility]. PMH: PAF, CAD s/p CABG x 5, aortic stenosis s/p TAVR 2024, CHB s/p PPM 03/2023, HTN, GERD, neuropathy, OA, bladder neoplasm and insomnia.   Left tibial fracture- mechanical fall 12/09, surgery not recommended, arthrocentesis performed, reports increased pain at times, observed crossing legs or sitting in odd positions in bed, remains on Percocet and robaxin   Memory impairment- followed by ST> needing assistance to perform ADLs, repetitive speech, noted to have concentration deficit on initial evaluation, unable to see MMSE results, not on medication   Reactive depression- ongoing, associated with wife passing within past year and recent decline in  health, 01/02 Zoloft  increased to 50 mg, no change in mood, does not leave room often, does not want sunlight in room, psych consult made but they have not come for evaluation, he is interested in trying another medication  PAF- TSH 1.89 12/28/2023, not on rate control medication, remains on Eliquis   H/o CVA- noted 12/2022, MRI noted acute lacunar infarct in left and right frontal white matter, remains on Eliquis  Urinary retention- 12/26 started on tamsulosin , reports improvement with medication  S/p TAVR HTN- BUN/creat 16/0.8 05/06/2024, remains on hydrochlorothiazide   Son planning for possible discharge home 01/16.   Recent weights:  01/07- 164.4 lbs  12/12- 173.5 lbs     Past Medical History:  Diagnosis Date   Acute CVA (cerebrovascular accident) (HCC) 04/02/2024   Arthritis    Atrial fibrillation, permanent (HCC)    Bladder neoplasm    Chronic cough    NO CARDIAC OR PULMONARY RELATED   Coronary artery disease CARDIOLOGIST-  DR KLEIN/ [facility]   a. s/p CABG 2004, b. LHC with patent grafts 07/2005, ejection fraction 50%;  c. Myoview 2/15: no ischemia, EF 61%   Cough 04/29/2009   Followed in Pulmonary clinic/ [facility]/ [place]   - Trial off cozaar  07/20/2012 >>>        Dry eyes    Dyslipidemia    Family history of colon cancer 10/31/2016   GERD (gastroesophageal reflux disease)    Hematuria 07/27/2020   Oct 13, 2003 Entered By: MARSA BOUCHARD Comment: s/p cystoscopy Willma Beat 2005, normal     History of adenomatous polyp of colon 10/31/2016   History of CVA (cerebrovascular accident) 03/10/2020  Stroke in the Left eye, and brain stem   History of CVA in adulthood 01/23/2023   HTN (hypertension)    PONV (postoperative nausea and vomiting)    From having surgery back in the 1950's   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 Amiodarone  Rx   S/P CABG x 5 2004   S/P TAVR (transcatheter aortic valve replacement) 11/15/2022   s/p  TAVR with a 29mm Edwards S3UR via the TF approach by Dr. Wonda & Maryjane.   Severe aortic stenosis    Shingles 12/08/2023   Past Surgical History:  Procedure Laterality Date   CARDIAC CATHETERIZATION  07-26-2005  DR GAMBLE   PRESERVED LVF/  EF 50%/ PATENT GRAFTS   CARDIAC CATHETERIZATION  03-04-2003  DR GAMBLE   SEVERE 3 VESSEL DISEASE   CARDIAC CATHETERIZATION  1991  DR [facility]   PAF/ FALSE POSITIVE STRESS TEST   CAROTID ENDARTERECTOMY Left 2022   CATARACT EXTRACTION W/ INTRAOCULAR LENS IMPLANT Right    CORONARY ARTERY BYPASS GRAFT  03-08-2003  DR [id number]   LIMA TO LAD/ SVG TO OM2/  SVG TO DIAGONAL / SVG TO PDA & PLA   CYSTOSCOPY WITH BIOPSY N/A 12/25/2012   Procedure: CYSTOSCOPY WITH BLADDER BIOPSY;  Surgeon: Donnice Gwenyth Brooks, MD;  Location: [facility];  Service: Urology;  Laterality: N/A;   ELECTROPHYSIOLOGY STUDY  07-27-2005  DR DANELLE [place]   MAPPING --   RESULT NONINDUCIBLE VT OR SVT/  DX RIGHT VENTRICULAR OUTFLOW TRACT VT AND RULES OUT MORE MALIGNANT CAUSES OF VT   ENDARTERECTOMY Left 07/08/2020   Procedure: LEFT CAROTID ENDARTERECTOMY;  Surgeon: Serene Gaile ORN, MD;  Location: [facility] OR;  Service: Vascular;  Laterality: Left;   INGUINAL HERNIA REPAIR Bilateral 1954  &  1990   INTRAOPERATIVE TRANSTHORACIC ECHOCARDIOGRAM N/A 11/15/2022   Procedure: INTRAOPERATIVE TRANSTHORACIC ECHOCARDIOGRAM;  Surgeon: Wonda Sharper, MD;  Location: [facility] INVASIVE CV LAB;  Service: Open Heart Surgery;  Laterality: N/A;   KNEE ARTHROSCOPY Right 1994   LEFT HEART CATH AND CORS/GRAFTS ANGIOGRAPHY N/A 10/14/2022   Procedure: LEFT HEART CATH AND CORS/GRAFTS ANGIOGRAPHY;  Surgeon: Wonda Sharper, MD;  Location: [facility] INVASIVE CV LAB;  Service: Cardiovascular;  Laterality: N/A;   LUMBAR DISC SURGERY  1999   L4 -- L5   MOHS SURGERY     by Dr. Lyle 2019   PACEMAKER IMPLANT N/A 04/03/2023   Procedure: PACEMAKER IMPLANT;  Surgeon: Fernande Elspeth BROCKS, MD;  Location: [facility] INVASIVE CV LAB;   Service: Cardiovascular;  Laterality: N/A;   REMOVAL VOCAL CORD POLYPS  1978   TONSILLECTOMY  AS CHILD   TRANSCATHETER AORTIC VALVE REPLACEMENT, TRANSFEMORAL N/A 11/15/2022   Procedure: Transcatheter Aortic Valve Replacement, Transfemoral;  Surgeon: Wonda Sharper, MD;  Location: [facility] INVASIVE CV LAB;  Service: Open Heart Surgery;  Laterality: N/A;   TRANSTHORACIC ECHOCARDIOGRAM  08/08/2011   MILD LVH/  EF 55-60%/  GRADE I DIASTOLIC DYSFUNCTION/ MILD AV STENOSIS    Allergies[1]  Outpatient Encounter Medications as of 05/06/2024  Medication Sig   acetaminophen  (TYLENOL ) 500 MG tablet Take 500-1,000 mg by mouth every 6 (six) hours as needed (pain.).   apixaban  (ELIQUIS ) 5 MG TABS tablet Take 1 tablet (5 mg total) by mouth 2 (two) times daily. Take evening dose starting 07/09/20   butalbital -acetaminophen -caffeine  (FIORICET ) 50-325-40 MG tablet Take 1 tablet by mouth 3 (three) times daily as needed for headache.   cetirizine  (ZYRTEC ) 10 MG tablet Take 1 tablet (10 mg total) by mouth  daily.   hydrochlorothiazide  (HYDRODIURIL ) 25 MG tablet Take 1 tablet (25 mg total) by mouth daily.   hydrOXYzine  (ATARAX ) 25 MG tablet Take 25 mg by mouth daily as needed.   Iron , Ferrous Sulfate , 325 (65 Fe) MG TABS Take 325 mg by mouth daily.   latanoprost  (XALATAN ) 0.005 % ophthalmic solution Place 1 drop into both eyes at bedtime.   meclizine  (ANTIVERT ) 25 MG tablet Take 25 mg by mouth every 6 (six) hours as needed for dizziness.   methocarbamol  (ROBAXIN ) 500 MG tablet Take 500 mg by mouth every 8 (eight) hours as needed for muscle spasms.   oxyCODONE -acetaminophen  (PERCOCET/ROXICET) 5-325 MG tablet Take 1 tablet by mouth every 6 (six) hours as needed for severe pain (pain score 7-10).   Polyethyl Glycol-Propyl Glycol (SYSTANE ULTRA OP) Place 1-2 drops into both eyes once as needed (dry eyes.).   polyethylene glycol (MIRALAX ) 17 g packet Take 17 g by mouth daily.   potassium chloride  (KLOR-CON  M) 10 MEQ tablet  Take 10 mEq by mouth in the morning.   pravastatin  (PRAVACHOL ) 80 MG tablet Take 1 tablet (80 mg total) by mouth every evening.   senna-docusate (SENOKOT-S) 8.6-50 MG tablet Take 1 tablet by mouth at bedtime as needed for mild constipation or moderate constipation.   sertraline  (ZOLOFT ) 50 MG tablet Take 50 mg by mouth at bedtime.   No facility-administered encounter medications on file as of 05/06/2024.    Review of Systems  Constitutional:  Positive for activity change and appetite change. Negative for fatigue and fever.  HENT:  Negative for sore throat and trouble swallowing.   Eyes:  Negative for visual disturbance.  Respiratory:  Negative for cough and shortness of breath.   Cardiovascular:  Negative for chest pain and leg swelling.  Gastrointestinal:  Negative for abdominal distention and abdominal pain.  Genitourinary:  Positive for frequency. Negative for dysuria and hematuria.  Musculoskeletal:  Positive for gait problem.  Skin:  Negative for wound.  Neurological:  Positive for weakness. Negative for dizziness and headaches.  Psychiatric/Behavioral:  Positive for confusion and dysphoric mood. Negative for sleep disturbance. The patient is not nervous/anxious.     Immunization History  Administered Date(s) Administered   Fluad Quad(high Dose 65+) 12/28/2018, 02/17/2020   Fluad Trivalent(High Dose 65+) 01/23/2023   INFLUENZA, HIGH DOSE SEASONAL PF 12/25/2015, 02/19/2017, 01/28/2021   Influenza Split 03/15/2011, 01/19/2012   Influenza Whole 03/11/2002, 02/24/2009, 01/14/2010   Influenza,inj,Quad PF,6+ Mos 01/16/2013, 01/22/2014, 12/25/2014, 01/19/2016, 01/23/2017, 01/04/2018   Influenza-Unspecified 03/11/2002, 02/19/2003, 01/24/2004, 02/23/2005, 01/23/2006, 04/03/2007, 02/21/2008, 01/23/2009, 02/23/2009, 12/24/2009, 02/12/2020   PFIZER(Purple Top)SARS-COV-2 Vaccination 06/28/2019, 07/19/2019, 09/18/2019, 03/24/2020   Pneumococcal Conjugate-13 11/27/2013, 12/25/2014    Pneumococcal Polysaccharide-23 04/17/2002   Pneumococcal-Unspecified 03/11/2002   Td 12/07/2001, 04/26/2005, 05/11/2005   Td,absorbed, Preservative Free, Adult Use, Lf Unspecified 10/25/2021   Tdap 12/08/2011, 10/25/2021   Tetanus 11/24/2011   Zoster Recombinant(Shingrix) 08/20/2020, 01/01/2021   Zoster, Live 05/17/2007   Pertinent  Health Maintenance Due  Topic Date Due   Colonoscopy  05/30/2023   Influenza Vaccine  07/23/2024 (Originally 11/24/2023)      02/13/2023    3:46 PM 08/21/2023    2:09 PM 12/08/2023   12:32 PM 04/09/2024    1:52 PM 04/19/2024   11:12 AM  Fall Risk  Falls in the past year? 1 1 1 1 1   Was there an injury with Fall? 1  0  0  1 1  Was there an injury with Fall? - Comments fell due to stroke  Fall Risk Category Calculator 2 2 1 2 2   Patient at Risk for Falls Due to No Fall Risks History of fall(s) Impaired mobility;History of fall(s) History of fall(s);Impaired balance/gait Impaired balance/gait;History of fall(s)  Fall risk Follow up Education provided;Falls prevention discussed Falls evaluation completed Falls evaluation completed Falls evaluation completed Falls evaluation completed     Data saved with a previous flowsheet row definition   Functional Status Survey:    Vitals:   05/06/24 1313  BP: (!) 145/86  Pulse: 83  Resp: 18  Temp: (!) 97.1 F (36.2 C)  SpO2: 97%  Weight: 164 lb 6.4 oz (74.6 kg)  Height: 5' 10 (1.778 m)   Body mass index is 23.59 kg/m. Physical Exam Vitals reviewed.  Constitutional:      General: He is not in acute distress. HENT:     Head: Normocephalic.  Eyes:     General:        Right eye: No discharge.        Left eye: No discharge.  Cardiovascular:     Rate and Rhythm: Normal rate and regular rhythm.     Pulses: Normal pulses.     Heart sounds: Normal heart sounds.  Pulmonary:     Effort: Pulmonary effort is normal.     Breath sounds: Normal breath sounds.  Abdominal:     General: Bowel sounds are  normal. There is no distension.     Palpations: Abdomen is soft.     Tenderness: There is no abdominal tenderness.  Musculoskeletal:     Cervical back: Neck supple.     Right lower leg: No edema.     Left lower leg: No edema.  Skin:    General: Skin is warm.     Capillary Refill: Capillary refill takes less than 2 seconds.  Neurological:     General: No focal deficit present.     Mental Status: He is alert and oriented to person, place, and time.     Motor: Weakness present.     Gait: Gait abnormal.  Psychiatric:        Mood and Affect: Mood normal.     Labs reviewed: Recent Labs    01/01/24 0936 04/02/24 0845 04/03/24 0114 04/11/24 0000 04/22/24 0000 04/29/24 0000 05/06/24 0000  NA 139 140 138   < > 139 139 138  K 3.4* 3.3* 3.7   < > 3.4* 3.2* 3.5  CL 99 101 102   < > 100 98* 98*  CO2 27 29 26    < > 30* 34* 31*  GLUCOSE 104* 99 148*  --   --   --   --   BUN 14 14 14    < > 13 17 16   CREATININE 0.88 0.77 1.03   < > 0.7 0.7 0.8  CALCIUM  9.2 9.3 8.7*   < > 8.7 8.8 8.8  MG  --  2.1  --   --   --   --   --    < > = values in this interval not displayed.   Recent Labs    12/28/23 0947 04/02/24 0845 04/03/24 0114  AST 25 28 40  ALT 13 15 16   ALKPHOS 63 62 53  BILITOT 0.7 1.6* 1.9*  PROT 7.2 7.1 7.0  ALBUMIN 4.0 3.9 3.7   Recent Labs    12/28/23 0947 01/01/24 0936 04/02/24 0845 04/03/24 0114 04/05/24 0236 04/11/24 0000  WBC 4.6   < > 7.5 10.6* 8.6 8.6  NEUTROABS 2.7  --   --   --   --  5,891.00  HGB 12.8*   < > 14.5 14.1 12.6* 12.5*  HCT 38.3*   < > 42.5 42.3 36.8* 38*  MCV 96.5   < > 99.3 98.1 98.9  --   PLT 153.0   < > 135* 147* 146* 281   < > = values in this interval not displayed.   Lab Results  Component Value Date   TSH 1.89 12/28/2023   Lab Results  Component Value Date   HGBA1C 5.2 04/03/2024   Lab Results  Component Value Date   CHOL 140 04/03/2024   HDL 70 04/03/2024   LDLCALC 62 04/03/2024   LDLDIRECT 88 07/27/2020   TRIG 41  04/03/2024   CHOLHDL 2.0 04/03/2024    Significant Diagnostic Results in last 30 days:  No results found.  Assessment/Plan 1. Closed fracture of lateral portion of left tibial plateau with routine healing, subsequent encounter (Primary) - continues to report increased pain  - observed sitting in odd positions - ? Cognitive involvement  - scheduled to see ortho end of month - cont percocet and robaxin    2. Memory impairment - followed by ST - recent evaluation noted assistance with ADLs, concentration deficit and decreased ability to perform tasks  - unable to obtain MMSE - ? Depression versus MCI/beginning dementia  3. Reactive depression - see above  - lack of motivation, does not leave room, avoiding sunlight  - associated with wife passing and recent decline in health - psych consult pending  - PHQ score 6> was 5 - 01/02 zoloft  increased to 50 mg  - will start Wellbutrin   - buPROPion  (WELLBUTRIN  XL) 150 MG 24 hr tablet; Take 1 tablet (150 mg total) by mouth daily.  Dispense: 30 tablet; Refill: 0  4. Permanent atrial fibrillation (HCC) - HR< 100 without medications  - cont Eliquis    5. History of CVA (cerebrovascular accident) - cont Eliquis    6. Urinary retention - 12/26 tamsulosin  started   7. S/P TAVR (transcatheter aortic valve replacement)  8. Essential hypertension - cont HCTZ    Family/ staff Communication: plan discussed with patient , son and nurse  Labs/tests ordered:  none        [1]  Allergies Allergen Reactions   Hydralazine  Hcl Other (See Comments)    Had severe headache all night.  Nightmares.  Drugged feeling.  Extreme dizziness   Ace Inhibitors Other (See Comments)    COUGH  Product containing angiotensin-converting enzyme inhibitor (product)   Zocor [Simvastatin] Other (See Comments)    MYALGIA   Nsaids Other (See Comments)    Would avoid given anticoagulation   Prednisone  Other (See Comments)    Prednisone  caused pt to have  elevated BP.   Cephalexin Rash   Chocolate Other (See Comments)    Headaches    Doxycycline  Other (See Comments)    Nausea and abd pain   Gabapentin  Other (See Comments)    Dizziness   Hycodan [Hydrocodone  Bit-Homatrop Mbr] Nausea And Vomiting   Oxycodone  Nausea Only    Pt states that he cannot take prescription pain medication   "

## 2024-05-07 ENCOUNTER — Ambulatory Visit: Admitting: Cardiology

## 2024-05-07 MED ORDER — BUPROPION HCL ER (XL) 150 MG PO TB24: 150.0000 mg | ORAL_TABLET | Freq: Every day | ORAL | 0 refills | Status: DC

## 2024-05-08 ENCOUNTER — Encounter: Payer: Self-pay | Admitting: Orthopedic Surgery

## 2024-05-08 ENCOUNTER — Non-Acute Institutional Stay (SKILLED_NURSING_FACILITY): Payer: Self-pay | Admitting: Orthopedic Surgery

## 2024-05-08 DIAGNOSIS — R413 Other amnesia: Secondary | ICD-10-CM

## 2024-05-08 DIAGNOSIS — S82122S Displaced fracture of lateral condyle of left tibia, sequela: Secondary | ICD-10-CM | POA: Diagnosis not present

## 2024-05-08 DIAGNOSIS — I10 Essential (primary) hypertension: Secondary | ICD-10-CM

## 2024-05-08 DIAGNOSIS — R339 Retention of urine, unspecified: Secondary | ICD-10-CM

## 2024-05-08 DIAGNOSIS — I4821 Permanent atrial fibrillation: Secondary | ICD-10-CM

## 2024-05-08 DIAGNOSIS — Z8673 Personal history of transient ischemic attack (TIA), and cerebral infarction without residual deficits: Secondary | ICD-10-CM | POA: Diagnosis not present

## 2024-05-08 DIAGNOSIS — F329 Major depressive disorder, single episode, unspecified: Secondary | ICD-10-CM | POA: Diagnosis not present

## 2024-05-08 DIAGNOSIS — Z952 Presence of prosthetic heart valve: Secondary | ICD-10-CM

## 2024-05-08 NOTE — Progress Notes (Signed)
 " [facility] [address] [place]. [place] [address] [phone]       HOSPITAL FOLLOW UP NOTE  [name] Date of Birth:  [birth year] Medical Record Number:  [id number]   Reason for Referral:  hospital stroke follow up    SUBJECTIVE:   CHIEF COMPLAINT:  Chief Complaint  Patient presents with   Follow-up    Patient in room 3 with CNA from facility.  Patient is here for hospital follow up from a stroke. Patient states he has blurry vision and pressure on his forehead off and on. Patient stated that he didn't have weakness on one side, denies numbness. Patient stated that if he's laying down and stands up quickly he gets dizzy. Patient states that he is having memory problems (when asked, patient stated that he could remember from when he was younger easier) and that he doesn't sleep at night.      HPI:   [name] is a 89 y.o. male with history of HTN, HLD, CVA, Afib, left carotid stenosis  s/p CEA 2022, CAD s/p CABG, and Aortic stenosis s/p TAVR who presented to [facility] ED on 04/02/2024 after episode of dizziness/vertigo the day prior leading to a fall.  MRI showed acute lacunar infarcts in the left frontal white matter and 2 punctate acute lacunar infarcts in the right frontal matter, suspected incidental finding.  CTA negative LVO.  EF 45 to 50%.  LDL 62.  A1c 5.2.  Recommended continuation of Eliquis  5 mg twice daily and continue pravastatin  80 mg daily.  Hospital course complicated by small subchondral fracture of left tibial plateau, orthopedics recommended supportive care and weightbearing as tolerated, advised outpatient follow-up.  He was discharged to SNF.   Today, 05/09/2024, patient is being seen for initial hospital follow-up accompanied by facility aide.  Overall has been stable from stroke standpoint without new stroke/TIA symptoms.  He is currently residing at [facility] but in the process of trying to return home with potential  discharge tomorrow.  Continues to work with PT post left leg fracture, notes occasional pain, sometimes will be provided with pain reliever with benefit.  Facility has been working on depression which has been ongoing especially since the passing of his wife and recent overall decline in health.  He continues to have persistent dizziness which greatly limited functioning ability prior to admission, ambulating with rolling walker prior due to extent of dizziness and unable to drive again due to dizziness.  He is scheduled with [facility] for vestibular screening in March, previously evaluated by Dr. Ines for dizziness but unable to determine etiology.  Dizziness has been ongoing for at least over the past 2 years he was brought today via wheelchair, is able to ambulate short distance with therapy and rolling walker.  He has been having some mild memory loss but reports this was present prior to his stroke, he is currently being followed by speech therapy for cognitive therapy.  He remains on Eliquis  and pravastatin  without side effects.  Routinely follows with PCP.  No further questions or concerns at this time.      PERTINENT IMAGING  MRI: Punctate acute lacunar infarct of the L frontal white matter and 2 punctate acute lacunar infarcts in the R frontal white matter, with extensive chronic white matter disease. CTA: No large vessel occlusion or hemodynamically significant stenosis 2D Echo: EF 45 to 50%, aortic dilatation, no PFO LDL: 62 HgbA1c: 5.2    ROS:  14 system review of systems performed and negative with exception of those listed in HPI  PMH:  Past Medical History:  Diagnosis Date   Acute CVA (cerebrovascular accident) (HCC) 04/02/2024   Arthritis    Atrial fibrillation, permanent (HCC)    Bladder neoplasm    Chronic cough    NO CARDIAC OR PULMONARY RELATED   Coronary artery disease CARDIOLOGIST-  DR KLEIN/ [facility]   a. s/p CABG 2004, b. LHC with patent grafts 07/2005, ejection  fraction 50%;  c. Myoview 2/15: no ischemia, EF 61%   Cough 04/29/2009   Followed in Pulmonary clinic/ [facility]/ [facility]   - Trial off cozaar  07/20/2012 >>>        Dry eyes    Dyslipidemia    Family history of colon cancer 10/31/2016   GERD (gastroesophageal reflux disease)    Hematuria 07/27/2020   Oct 13, 2003 Entered By: MARSA BOUCHARD Comment: s/p cystoscopy Willma Beat 2005, normal     History of adenomatous polyp of colon 10/31/2016   History of CVA (cerebrovascular accident) 03/10/2020   Stroke in the Left eye, and brain stem   History of CVA in adulthood 01/23/2023   HTN (hypertension)    PONV (postoperative nausea and vomiting)    From having surgery back in the 1950's   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 Amiodarone  Rx   S/P CABG x 5 2004   S/P TAVR (transcatheter aortic valve replacement) 11/15/2022   s/p TAVR with a 29mm Edwards S3UR via the TF approach by Dr. Wonda & Maryjane.   Severe aortic stenosis    Shingles 12/08/2023    PSH:  Past Surgical History:  Procedure Laterality Date   CARDIAC CATHETERIZATION  07-26-2005  DR GAMBLE   PRESERVED LVF/  EF 50%/ PATENT GRAFTS   CARDIAC CATHETERIZATION  03-04-2003  DR GAMBLE   SEVERE 3 VESSEL DISEASE   CARDIAC CATHETERIZATION  1991  DR [facility]   PAF/ FALSE POSITIVE STRESS TEST   CAROTID ENDARTERECTOMY Left 2022   CATARACT EXTRACTION W/ INTRAOCULAR LENS IMPLANT Right    CORONARY ARTERY BYPASS GRAFT  03-08-2003  DR HZMYJMIU   LIMA TO LAD/ SVG TO OM2/  SVG TO DIAGONAL / SVG TO PDA & PLA   CYSTOSCOPY WITH BIOPSY N/A 12/25/2012   Procedure: CYSTOSCOPY WITH BLADDER BIOPSY;  Surgeon: Donnice Gwenyth Brooks, MD;  Location: [facility];  Service: Urology;  Laterality: N/A;   ELECTROPHYSIOLOGY STUDY  07-27-2005  DR DANELLE BIRMINGHAM   MAPPING --   RESULT NONINDUCIBLE VT OR SVT/  DX RIGHT VENTRICULAR OUTFLOW TRACT VT AND RULES OUT MORE MALIGNANT CAUSES OF VT    ENDARTERECTOMY Left 07/08/2020   Procedure: LEFT CAROTID ENDARTERECTOMY;  Surgeon: Serene Gaile ORN, MD;  Location: [facility] OR;  Service: Vascular;  Laterality: Left;   INGUINAL HERNIA REPAIR Bilateral 1954  &  1990   INTRAOPERATIVE TRANSTHORACIC ECHOCARDIOGRAM N/A 11/15/2022   Procedure: INTRAOPERATIVE TRANSTHORACIC ECHOCARDIOGRAM;  Surgeon: Wonda Sharper, MD;  [facility]VAS[facility] CV LAB;  Service: Open Heart Surgery;  Laterality: N/A;   KNEE ARTHROSCOPY Right 1994   LEFT HEART CATH AND CORS/GRAFTS ANGIOGRAPHY N/A 10/14/2022   Procedure: LEFT HEART CATH AND CORS/GRAFTS ANGIOGRAPHY;  Surgeon: Wonda Sharper, MD;  Location: [facility] INVASIVE CV LAB;  Service: Cardiovascular;  Laterality: N/A;   LUMBAR DISC SURGERY  1999   L4 -- L5   MOHS SURGERY     by Dr. Lyle 2019   PACEMAKER IMPLANT N/A 04/03/2023  Procedure: PACEMAKER IMPLANT;  Surgeon: Fernande Elspeth BROCKS, MD;  Location: [facility] INVASIVE CV LAB;  Service: Cardiovascular;  Laterality: N/A;   REMOVAL VOCAL CORD POLYPS  1978   TONSILLECTOMY  AS CHILD   TRANSCATHETER AORTIC VALVE REPLACEMENT, TRANSFEMORAL N/A 11/15/2022   Procedure: Transcatheter Aortic Valve Replacement, Transfemoral;  Surgeon: Wonda Sharper, MD;  Location: [facility] INVASIVE CV LAB;  Service: Open Heart Surgery;  Laterality: N/A;   TRANSTHORACIC ECHOCARDIOGRAM  08/08/2011   MILD LVH/  EF 55-60%/  GRADE I DIASTOLIC DYSFUNCTION/ MILD AV STENOSIS    Social History:  Social History   Socioeconomic History   Marital status: Widowed    Spouse name: [name]   Number of children: 3   Years of education: Not on file   Highest education level: Not on file  Occupational History   Occupation: Retired  Tobacco Use   Smoking status: Former    Current packs/day: 0.00    Average packs/day: 1 pack/day for 25.0 years (25.0 ttl pk-yrs)    Types: Cigarettes    Start date: 04/26/1955    Quit date: 04/25/1980    Years since quitting: 44.0   Smokeless tobacco: Never  Vaping Use   Vaping status: Never  Used  Substance and Sexual Activity   Alcohol use: No    Alcohol/week: 0.0 standard drinks of alcohol   Drug use: No   Sexual activity: Not on file  Other Topics Concern   Not on file  Social History Narrative   [employer]-'[employer], overseas to [place]   Some care through [place]   No service disability   Retired    Widowed 2025, was prev married 60+ years   Patient in [place].    Social Drivers of Health   Tobacco Use: Medium Risk (05/09/2024)   Patient History    Smoking Tobacco Use: Former    Smokeless Tobacco Use: Never    Passive Exposure: Not on file  Financial Resource Strain: Low Risk (02/13/2023)   Overall Financial Resource Strain (CARDIA)    Difficulty of Paying Living Expenses: Not hard at all  Food Insecurity: No Food Insecurity (04/04/2024)   Epic    Worried About Programme Researcher, Broadcasting/film/video in the Last Year: Never true    Ran Out of Food in the Last Year: Never true  Transportation Needs: No Transportation Needs (04/04/2024)   Epic    Lack of Transportation (Medical): No    Lack of Transportation (Non-Medical): No  Physical Activity: Insufficiently Active (02/13/2023)   Exercise Vital Sign    Days of Exercise per Week: 2 days    Minutes of Exercise per Session: 40 min  Stress: No Stress Concern Present (02/13/2023)   [employer] of Occupational Health - Occupational Stress Questionnaire    Feeling of Stress : Not at all  Social Connections: Socially Isolated (04/04/2024)   Social Connection and Isolation Panel    Frequency of Communication with Friends and Family: More than three times a week    Frequency of Social Gatherings with Friends and Family: Once a week    Attends Religious Services: Never    Database Administrator or Organizations: No    Attends Banker Meetings: Never    Marital Status: Widowed  Intimate Partner Violence: Not At Risk (04/04/2024)   Epic    Fear of Current or Ex-Partner: No    Emotionally Abused: No    Physically Abused:  No    Sexually Abused: No  Depression (PHQ2-9): Medium Risk (05/07/2024)  Depression (PHQ2-9)    PHQ-2 Score: 6  Alcohol Screen: Low Risk (02/13/2023)   Alcohol Screen    Last Alcohol Screening Score (AUDIT): 0  Housing: Unknown (04/04/2024)   Epic    Unable to Pay for Housing in the Last Year: No    Number of Times Moved in the Last Year: Not on file    Homeless in the Last Year: No  Utilities: Not At Risk (04/04/2024)   Epic    Threatened with loss of utilities: No  Health Literacy: Adequate Health Literacy (02/13/2023)   B1300 Health Literacy    Frequency of need for help with medical instructions: Never    Family History:  Family History  Problem Relation Age of Onset   Cancer Mother    Stroke Neg Hx     Medications:  Medications Ordered Prior to Encounter[1]  Allergies:  Allergies[2]    OBJECTIVE:  Physical Exam  Vitals:   05/09/24 0857 05/09/24 0905  BP: (!) 147/69 (!) 141/67  Pulse: 63 (!) 58  Weight: 162 lb (73.5 kg)   Height: 5' 10 (1.778 m)    Body mass index is 23.24 kg/m. No results found.   General: well developed, well nourished, pleasant elderly Caucasian male, seated, in no evident distress  Neurologic Exam Mental Status: Awake and fully alert.  No evidence of aphasia or dysarthria.  Oriented to place and time. Recent memory mildly impaired and remote memory intact. Attention span, concentration and fund of knowledge mostly appropriate but easily fixated on 1 topic such as his chronic dizziness. Mood and affect appropriate.  Cranial Nerves: Pupils equal, briskly reactive to light. Extraocular movements full without nystagmus. Visual fields full to confrontation. Hearing intact. Facial sensation intact. Face, tongue, palate moves normally and symmetrically.  Motor: Normal bulk and tone. Normal strength in all tested extremity muscles except limited testing of LLE in setting of recent fracture and pain Sensory.: intact to touch , pinprick ,  position and vibratory sensation.  Coordination: Rapid alternating movements normal in all extremities. Finger-to-nose performed accurately bilaterally  Gait and Station: Deferred     NIHSS  0 Modified Rankin  0      ASSESSMENT: [name] [name] is a 89 y.o. year old male with incidental finding of bilateral frontal lobe strokes on 04/02/2024 after presenting with episode of vertigo/dizziness resulting in a fall. Vascular risk factors include hx of CRAO 2021, left carotid stenosis s/p CEA 2022, A-fib on Eliquis , HTN, HLD, CAD s/p CABG, s/p TAVR and advanced age.      PLAN:  Ischemic strokes:  Overall recovered well. Unable to appreciate residual deficit on exam.  He does mention short-term memory difficulties which is not new, possibly worsened in setting of recent stroke, discussed typical recovery time and to continue to work with speech therapy.  This can be monitored ongoing through facility/PCP.  Dizziness/vertigo is persistent although chronic over the past 2 years and is currently being worked up by [facility].  Prior full evaluation by Dr. Ines and Dr. Rosemarie without clear cause Continue Eliquis  5mg  twice daily and pravastatin  (Pravachol )  for secondary stroke prevention managed/prescribed by PCP.   Discussed secondary stroke prevention measures and importance of close PCP follow up for aggressive stroke risk factor management including BP goal<130/90, HLD with LDL goal<70 and DM with A1c.<7 .  Stroke labs 03/2024: LDL 62, A1c 5.2 I have gone over the pathophysiology of stroke, warning signs and symptoms, risk factors and their management in some detail with  instructions to go to the closest emergency room for symptoms of concern.     No further recommendations from stroke standpoint and is closely being followed by PCP and other specialties.  He can follow-up here on an as-needed basis and advised to call with any questions or concerns in the future.   CC:  [facility] provider:  Dr. Rosemarie PCP: Cleatus Arlyss RAMAN, MD    I personally spent a total of 55 minutes in the care of the patient today including preparing to see the patient, getting/reviewing separately obtained history, performing a medically appropriate exam/evaluation, counseling and educating, referring and communicating with other health care professionals, and documenting clinical information in the EHR.  Harlene Bogaert, AGNP-BC  [facility] [address] [place], [place] [address]  Phone [phone] Fax [phone] Note: This document was prepared with digital dictation and possible smart phrase technology. Any transcriptional errors that result from this process are unintentional.         [1]  Current Outpatient Medications on File Prior to Visit  Medication Sig Dispense Refill   acetaminophen  (TYLENOL ) 500 MG tablet Take 500-1,000 mg by mouth every 6 (six) hours as needed (pain.).     butalbital -acetaminophen -caffeine  (FIORICET ) 50-325-40 MG tablet Take 1 tablet by mouth 3 (three) times daily as needed for headache. 14 tablet 0   cetirizine  (ZYRTEC ) 10 MG tablet Take 1 tablet (10 mg total) by mouth daily.     Iron , Ferrous Sulfate , 325 (65 Fe) MG TABS Take 325 mg by mouth daily.     latanoprost  (XALATAN ) 0.005 % ophthalmic solution Place 1 drop into both eyes at bedtime.     meclizine  (ANTIVERT ) 25 MG tablet Take 25 mg by mouth every 6 (six) hours as needed for dizziness.     methocarbamol  (ROBAXIN ) 500 MG tablet Take 500 mg by mouth every 8 (eight) hours as needed for muscle spasms.     oxyCODONE -acetaminophen  (PERCOCET/ROXICET) 5-325 MG tablet Take 1 tablet by mouth every 6 (six) hours as needed for severe pain (pain score 7-10). 30 tablet 0   Polyethyl Glycol-Propyl Glycol (SYSTANE ULTRA OP) Place 1-2 drops into both eyes once as needed (dry eyes.).     polyethylene glycol (MIRALAX ) 17 g packet Take 17 g by mouth daily. 14 each 0   potassium chloride   (KLOR-CON  M) 10 MEQ tablet Take 10 mEq by mouth in the morning.     senna-docusate (SENOKOT-S) 8.6-50 MG tablet Take 1 tablet by mouth at bedtime as needed for mild constipation or moderate constipation.     apixaban  (ELIQUIS ) 5 MG TABS tablet Take 1 tablet (5 mg total) by mouth 2 (two) times daily. Take evening dose starting 07/09/20 180 tablet 3   buPROPion  (WELLBUTRIN  XL) 150 MG 24 hr tablet Take 1 tablet (150 mg total) by mouth daily. 30 tablet 0   hydrochlorothiazide  (HYDRODIURIL ) 25 MG tablet Take 1 tablet (25 mg total) by mouth daily. 90 tablet 3   pravastatin  (PRAVACHOL ) 80 MG tablet Take 1 tablet (80 mg total) by mouth every evening. 90 tablet 0   sertraline  (ZOLOFT ) 50 MG tablet Take 50 mg by mouth at bedtime.     No current facility-administered medications on file prior to visit.  [2]  Allergies Allergen Reactions   Hydralazine  Hcl Other (See Comments)    Had severe headache all night.  Nightmares.  Drugged feeling.  Extreme dizziness   Ace Inhibitors Other (See Comments)    COUGH  Product containing angiotensin-converting enzyme inhibitor (product)  Zocor [Simvastatin] Other (See Comments)    MYALGIA   Nsaids Other (See Comments)    Would avoid given anticoagulation   Prednisone  Other (See Comments)    Prednisone  caused pt to have elevated BP.   Cephalexin Rash   Chocolate Other (See Comments)    Headaches    Doxycycline  Other (See Comments)    Nausea and abd pain   Gabapentin  Other (See Comments)    Dizziness   Hycodan [Hydrocodone  Bit-Homatrop Mbr] Nausea And Vomiting   Oxycodone  Nausea Only    Pt states that he cannot take prescription pain medication   "

## 2024-05-08 NOTE — Progress Notes (Signed)
 " Location:  Other [facility] Nursing Home Room Number: [facility] [address] Place of Service:  SNF [phone])  Provider: Gil Greig [name]  PCP: [name] Arlyss RAMAN, MD Patient Care Team: [name] Arlyss RAMAN, MD as PCP - General (Family Medicine) [name] Soyla Lunger, MD as PCP - Electrophysiology (Cardiology) [name] Oneil BROCKS, MD as PCP - Cardiology (Cardiology) [name] [name], [facility] (Inactive) as Pharmacist (Pharmacist) [facility]  Extended Emergency Contact Information Primary Emergency Contact: [name] [name] Mobile Phone: [phone] Relation: Son Secondary Emergency Contact: [name],[name] Mobile Phone: [phone] Relation: Son  Code Status: DNR Goals of care:  Advanced Directive information    05/08/2024    2:37 PM  Advanced Directives  Does Patient Have a Medical Advance Directive? Yes  Type of Advance Directive Out of facility DNR (pink MOST or yellow form)  Does patient want to make changes to medical advance directive? No - Patient declined     Allergies[1]  Chief Complaint  Patient presents with   Discharge Note    HPI:  89 y.o. male seen today for discharge evaluation.   He currently resides on the rehab unit at [facility]. PMH: PAF, CAD s/p CABG x 5, aortic stenosis s/p TAVR 2024, CHB s/p PPM 03/2023, HTN, GERD, neuropathy, OA, bladder neoplasm and insomnia.   Hospitalized 12/09-12/12 due to fall and CVA.   12/09 he had dizziness and fell on brick steps at home. Xray left hip/pelvis negative for fracture. CXR unremarkable. CT head negative for acute intracranial abnormality. Due to concerns of CVA in ED, MRI brain was performed. MRI noted acute lacunar infarct in left and right frontal white matter. Neurology thought CVA embolic and advised to continue Eliquis . CT left knee and MRI knee confirmed large joint effusion as well as lateral tibial plateau fracture. Orthopedics performed arthrocentesis at bedside. Surgery was not recommended.  He was discharged to SNF for additional PT/OT/nursing services.    Left tibial fracture- mechanical fall 12/09, surgery not recommended, arthrocentesis performed, reports increased pain at times, observed crossing legs or sitting in odd positions in bed, remains on Percocet and robaxin , follow up with orthopedics end of month Memory impairment- followed by ST> needing assistance to perform ADLs, repetitive speech, noted to have concentration deficit on initial evaluation, not on medication   Reactive depression- ongoing, associated with wife passing within past year and recent decline in health, 01/02 Zoloft  increased to 50 mg, no change in mood, does not leave room often, does not want sunlight in room, psych consult made but they have not come for evaluation, he is interested in trying another medication> 01/12 Wellbutrin  started PAF- TSH 1.89 12/28/2023, not on rate control medication, remains on Eliquis   H/o CVA- noted 12/2022, MRI noted acute lacunar infarct in left and right frontal white matter, remains on Eliquis  Urinary retention- 12/26 started on tamsulosin , reports improvement with medication  S/p TAVR HTN- BUN/creat 16/0.8 05/06/2024, remains on hydrochlorothiazide   He plans to discharge home 01/16. Sons are main support systems and plan to help. [name] [name] has been ordered. HH PT/OT recommended. I have advised sons to schedule follow up appointment with PCP in 1-2 weeks.   Past Medical History:  Diagnosis Date   Acute CVA (cerebrovascular accident) (HCC) 04/02/2024   Arthritis    Atrial fibrillation, permanent (HCC)    Bladder neoplasm    Chronic cough    NO CARDIAC OR PULMONARY RELATED   Coronary artery disease CARDIOLOGIST-  DR KLEIN/ [facility]   a. s/p CABG 2004, b. LHC  with patent grafts 07/2005, ejection fraction 50%;  c. Myoview 2/15: no ischemia, EF 61%   Cough 04/29/2009   Followed in Pulmonary clinic/ [facility]/ [facility]   - Trial off cozaar  07/20/2012 >>>        Dry eyes     Dyslipidemia    Family history of colon cancer 10/31/2016   GERD (gastroesophageal reflux disease)    Hematuria 07/27/2020   Oct 13, 2003 Entered By: [name] [name] Comment: s/p cystoscopy [name] [name] 2005, normal     History of adenomatous polyp of colon 10/31/2016   History of CVA (cerebrovascular accident) 03/10/2020   Stroke in the Left eye, and brain stem   History of CVA in adulthood 01/23/2023   HTN (hypertension)    PONV (postoperative nausea and vomiting)    From having surgery back in the 1950's   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 Amiodarone  Rx   S/P CABG x 5 2004   S/P TAVR (transcatheter aortic valve replacement) 11/15/2022   s/p TAVR with a 29mm Edwards S3UR via the TF approach by Dr. Wonda & Maryjane.   Severe aortic stenosis    Shingles 12/08/2023    Past Surgical History:  Procedure Laterality Date   CARDIAC CATHETERIZATION  07-26-2005  DR GAMBLE   PRESERVED LVF/  EF 50%/ PATENT GRAFTS   CARDIAC CATHETERIZATION  03-04-2003  DR GAMBLE   SEVERE 3 VESSEL DISEASE   CARDIAC CATHETERIZATION  1991  DR [facility]   PAF/ FALSE POSITIVE STRESS TEST   CAROTID ENDARTERECTOMY Left 2022   CATARACT EXTRACTION W/ INTRAOCULAR LENS IMPLANT Right    CORONARY ARTERY BYPASS GRAFT  03-08-2003  DR HZMYJMIU   LIMA TO LAD/ SVG TO OM2/  SVG TO DIAGONAL / SVG TO PDA & PLA   CYSTOSCOPY WITH BIOPSY N/A 12/25/2012   Procedure: CYSTOSCOPY WITH BLADDER BIOPSY;  Surgeon: [name] Gwenyth Brooks, MD;  Location: [facility];  Service: Urology;  Laterality: N/A;   ELECTROPHYSIOLOGY STUDY  07-27-2005  DR DANELLE BIRMINGHAM   MAPPING --   RESULT NONINDUCIBLE VT OR SVT/  DX RIGHT VENTRICULAR OUTFLOW TRACT VT AND RULES OUT MORE MALIGNANT CAUSES OF VT   ENDARTERECTOMY Left 07/08/2020   Procedure: LEFT CAROTID ENDARTERECTOMY;  Surgeon: Serene Gaile ORN, MD;  Location: [facility] OR;  Service: Vascular;  Laterality: Left;   INGUINAL HERNIA REPAIR  Bilateral 1954  &  1990   INTRAOPERATIVE TRANSTHORACIC ECHOCARDIOGRAM N/A 11/15/2022   Procedure: INTRAOPERATIVE TRANSTHORACIC ECHOCARDIOGRAM;  Surgeon: Wonda Sharper, MD;  Location: [facility] INVASIVE CV LAB;  Service: Open Heart Surgery;  Laterality: N/A;   KNEE ARTHROSCOPY Right 1994   LEFT HEART CATH AND CORS/GRAFTS ANGIOGRAPHY N/A 10/14/2022   Procedure: LEFT HEART CATH AND CORS/GRAFTS ANGIOGRAPHY;  Surgeon: Wonda Sharper, MD;  Location: [facility] INVASIVE CV LAB;  Service: Cardiovascular;  Laterality: N/A;   LUMBAR DISC SURGERY  1999   L4 -- L5   MOHS SURGERY     by Dr. Lyle 2019   PACEMAKER IMPLANT N/A 04/03/2023   Procedure: PACEMAKER IMPLANT;  Surgeon: Fernande Elspeth BROCKS, MD;  Location: [facility] INVASIVE CV LAB;  Service: Cardiovascular;  Laterality: N/A;   REMOVAL VOCAL CORD POLYPS  1978   TONSILLECTOMY  AS CHILD   TRANSCATHETER AORTIC VALVE REPLACEMENT, TRANSFEMORAL N/A 11/15/2022   Procedure: Transcatheter Aortic Valve Replacement, Transfemoral;  Surgeon: Wonda Sharper, MD;  Location: [name] INVASIVE CV LAB;  Service: Open Heart Surgery;  Laterality: N/A;   TRANSTHORACIC ECHOCARDIOGRAM  08/08/2011  MILD LVH/  EF 55-60%/  GRADE I DIASTOLIC DYSFUNCTION/ MILD AV STENOSIS      reports that he quit smoking about 44 years ago. His smoking use included cigarettes. He started smoking about 69 years ago. He has a 25 pack-year smoking history. He has never used smokeless tobacco. He reports that he does not drink alcohol and does not use drugs. Social History   Socioeconomic History   Marital status: Widowed    Spouse name: [name]   Number of children: 3   Years of education: Not on file   Highest education level: Not on file  Occupational History   Occupation: Retired  Tobacco Use   Smoking status: Former    Current packs/day: 0.00    Average packs/day: 1 pack/day for 25.0 years (25.0 ttl pk-yrs)    Types: Cigarettes    Start date: 04/26/1955    Quit date: 04/25/1980    Years since quitting: 44.0    Smokeless tobacco: Never  Vaping Use   Vaping status: Never Used  Substance and Sexual Activity   Alcohol use: No    Alcohol/week: 0.0 standard drinks of alcohol   Drug use: No   Sexual activity: Not on file  Other Topics Concern   Not on file  Social History Narrative   [employer]-'58, overseas to [place]   Some care through [place]   No service disability   Retired    Widowed 2025, was prev married 60+ years   Social Drivers of Health   Tobacco Use: Medium Risk (05/08/2024)   Patient History    Smoking Tobacco Use: Former    Smokeless Tobacco Use: Never    Passive Exposure: Not on Actuary Strain: Low Risk (02/13/2023)   Overall Financial Resource Strain (CARDIA)    Difficulty of Paying Living Expenses: Not hard at all  Food Insecurity: No Food Insecurity (04/04/2024)   Epic    Worried About Radiation Protection Practitioner of Food in the Last Year: Never true    Ran Out of Food in the Last Year: Never true  Transportation Needs: No Transportation Needs (04/04/2024)   Epic    Lack of Transportation (Medical): No    Lack of Transportation (Non-Medical): No  Physical Activity: Insufficiently Active (02/13/2023)   Exercise Vital Sign    Days of Exercise per Week: 2 days    Minutes of Exercise per Session: 40 min  Stress: No Stress Concern Present (02/13/2023)   [employer] of Occupational Health - Occupational Stress Questionnaire    Feeling of Stress : Not at all  Social Connections: Socially Isolated (04/04/2024)   Social Connection and Isolation Panel    Frequency of Communication with Friends and Family: More than three times a week    Frequency of Social Gatherings with Friends and Family: Once a week    Attends Religious Services: Never    Database Administrator or Organizations: No    Attends Banker Meetings: Never    Marital Status: Widowed  Intimate Partner Violence: Not At Risk (04/04/2024)   Epic    Fear of Current or Ex-Partner: No    Emotionally  Abused: No    Physically Abused: No    Sexually Abused: No  Depression (PHQ2-9): Medium Risk (05/07/2024)   Depression (PHQ2-9)    PHQ-2 Score: 6  Alcohol Screen: Low Risk (02/13/2023)   Alcohol Screen    Last Alcohol Screening Score (AUDIT): 0  Housing: Unknown (04/04/2024)   Epic    Unable  to Pay for Housing in the Last Year: No    Number of Times Moved in the Last Year: Not on file    Homeless in the Last Year: No  Utilities: Not At Risk (04/04/2024)   Epic    Threatened with loss of utilities: No  Health Literacy: Adequate Health Literacy (02/13/2023)   B1300 Health Literacy    Frequency of need for help with medical instructions: Never   Functional Status Survey:    Allergies[2]  Pertinent  Health Maintenance Due  Topic Date Due   Colonoscopy  05/30/2023   Influenza Vaccine  07/23/2024 (Originally 11/24/2023)    Medications: Outpatient Encounter Medications as of 05/08/2024  Medication Sig   acetaminophen  (TYLENOL ) 500 MG tablet Take 500-1,000 mg by mouth every 6 (six) hours as needed (pain.).   apixaban  (ELIQUIS ) 5 MG TABS tablet Take 1 tablet (5 mg total) by mouth 2 (two) times daily. Take evening dose starting 07/09/20   buPROPion  (WELLBUTRIN  XL) 150 MG 24 hr tablet Take 1 tablet (150 mg total) by mouth daily.   butalbital -acetaminophen -caffeine  (FIORICET ) 50-325-40 MG tablet Take 1 tablet by mouth 3 (three) times daily as needed for headache.   cetirizine  (ZYRTEC ) 10 MG tablet Take 1 tablet (10 mg total) by mouth daily.   hydrochlorothiazide  (HYDRODIURIL ) 25 MG tablet Take 1 tablet (25 mg total) by mouth daily.   Iron , Ferrous Sulfate , 325 (65 Fe) MG TABS Take 325 mg by mouth daily.   latanoprost  (XALATAN ) 0.005 % ophthalmic solution Place 1 drop into both eyes at bedtime.   meclizine  (ANTIVERT ) 25 MG tablet Take 25 mg by mouth every 6 (six) hours as needed for dizziness.   methocarbamol  (ROBAXIN ) 500 MG tablet Take 500 mg by mouth every 8 (eight) hours as needed for  muscle spasms.   oxyCODONE -acetaminophen  (PERCOCET/ROXICET) 5-325 MG tablet Take 1 tablet by mouth every 6 (six) hours as needed for severe pain (pain score 7-10).   Polyethyl Glycol-Propyl Glycol (SYSTANE ULTRA OP) Place 1-2 drops into both eyes once as needed (dry eyes.).   polyethylene glycol (MIRALAX ) 17 g packet Take 17 g by mouth daily.   potassium chloride  (KLOR-CON  M) 10 MEQ tablet Take 10 mEq by mouth in the morning.   pravastatin  (PRAVACHOL ) 80 MG tablet Take 1 tablet (80 mg total) by mouth every evening.   senna-docusate (SENOKOT-S) 8.6-50 MG tablet Take 1 tablet by mouth at bedtime as needed for mild constipation or moderate constipation.   sertraline  (ZOLOFT ) 50 MG tablet Take 50 mg by mouth at bedtime.   hydrOXYzine  (ATARAX ) 25 MG tablet Take 25 mg by mouth daily as needed. (Patient not taking: Reported on 05/08/2024)   No facility-administered encounter medications on file as of 05/08/2024.    Review of Systems  Constitutional:  Negative for fatigue and fever.  HENT:  Negative for sore throat and trouble swallowing.   Eyes:  Negative for visual disturbance.  Respiratory:  Negative for cough and shortness of breath.   Cardiovascular:  Negative for chest pain and leg swelling.  Gastrointestinal:  Negative for abdominal distention and abdominal pain.  Genitourinary:  Positive for frequency. Negative for dysuria and hematuria.  Musculoskeletal:  Positive for arthralgias and gait problem.  Skin:  Negative for wound.  Neurological:  Positive for weakness. Negative for dizziness and headaches.  Psychiatric/Behavioral:  Positive for confusion and dysphoric mood. Negative for sleep disturbance. The patient is not nervous/anxious.     Vitals:   05/08/24 1432  BP: 133/67  Pulse: ROLLEN)  56  Resp: 18  Temp: 97.8 F (36.6 C)  SpO2: 95%  Weight: 165 lb 3.2 oz (74.9 kg)  Height: 5' 10 (1.778 m)   Body mass index is 23.7 kg/m. Physical Exam Vitals reviewed.  Constitutional:       General: He is not in acute distress.    Appearance: He is not ill-appearing.  HENT:     Head: Normocephalic and atraumatic.  Eyes:     General:        Right eye: No discharge.        Left eye: No discharge.  Cardiovascular:     Rate and Rhythm: Normal rate. Rhythm irregular.     Pulses: Normal pulses.     Heart sounds: Normal heart sounds.  Pulmonary:     Effort: Pulmonary effort is normal. No respiratory distress.     Breath sounds: Normal breath sounds. No wheezing or rales.  Abdominal:     General: Bowel sounds are normal. There is no distension.     Palpations: Abdomen is soft.     Tenderness: There is no abdominal tenderness.  Musculoskeletal:        General: Swelling present.     Cervical back: Neck supple.     Comments: Mild left knee swelling, WBAT  Skin:    General: Skin is warm.     Capillary Refill: Capillary refill takes less than 2 seconds.  Neurological:     General: No focal deficit present.     Mental Status: He is alert and oriented to person, place, and time.     Gait: Gait abnormal.  Psychiatric:        Mood and Affect: Mood normal.     Labs reviewed: Basic Metabolic Panel: Recent Labs    01/01/24 0936 04/02/24 0845 04/03/24 0114 04/11/24 0000 04/22/24 0000 04/29/24 0000 05/06/24 0000  NA 139 140 138   < > 139 139 138  K 3.4* 3.3* 3.7   < > 3.4* 3.2* 3.5  CL 99 101 102   < > 100 98* 98*  CO2 27 29 26    < > 30* 34* 31*  GLUCOSE 104* 99 148*  --   --   --   --   BUN 14 14 14    < > 13 17 16   CREATININE 0.88 0.77 1.03   < > 0.7 0.7 0.8  CALCIUM  9.2 9.3 8.7*   < > 8.7 8.8 8.8  MG  --  2.1  --   --   --   --   --    < > = values in this interval not displayed.   Liver Function Tests: Recent Labs    12/28/23 0947 04/02/24 0845 04/03/24 0114  AST 25 28 40  ALT 13 15 16   ALKPHOS 63 62 53  BILITOT 0.7 1.6* 1.9*  PROT 7.2 7.1 7.0  ALBUMIN 4.0 3.9 3.7   No results for input(s): LIPASE, AMYLASE in the last 8760 hours. No results for  input(s): AMMONIA in the last 8760 hours. CBC: Recent Labs    12/28/23 0947 01/01/24 0936 04/02/24 0845 04/03/24 0114 04/05/24 0236 04/11/24 0000  WBC 4.6   < > 7.5 10.6* 8.6 8.6  NEUTROABS 2.7  --   --   --   --  5,891.00  HGB 12.8*   < > 14.5 14.1 12.6* 12.5*  HCT 38.3*   < > 42.5 42.3 36.8* 38*  MCV 96.5   < > 99.3 98.1 98.9  --  PLT 153.0   < > 135* 147* 146* 281   < > = values in this interval not displayed.   Cardiac Enzymes: No results for input(s): CKTOTAL, CKMB, CKMBINDEX, TROPONINI in the last 8760 hours. BNP: Invalid input(s): POCBNP CBG: Recent Labs    04/02/24 0837  GLUCAP 89    Procedures and Imaging Studies During Stay: No results found.  Assessment/Plan:   1. Closed fracture of lateral portion of left tibial plateau, sequela (Primary) - followed by orthopedics - WBAT - continues to have intermittent pain - cont oxycodone  and robaxin  prn - will need to discuss future oxycodone  refill with orthopedics   2. Memory impairment - recent ST evaluation> needs assistance with ADLs, concentration deficit and decreased ability to perform tasks  - ? Depression versus MCI  3. Reactive depression - ongoing  - associated with wife passing and recent decline in health - 01/02 Zoloft  increased to 50 mg - 01/12 Wellbutrin  started due to lack of motivation  - buPROPion  (WELLBUTRIN  XL) 150 MG 24 hr tablet; Take 1 tablet (150 mg total) by mouth daily.  Dispense: 30 tablet; Refill: 0 - sertraline  (ZOLOFT ) 50 MG tablet; Take 1 tablet (50 mg total) by mouth at bedtime.  Dispense: 30 tablet; Refill: 0  4. Permanent atrial fibrillation (HCC) - HR< 100 without medication - cont Eliquis   5. History of CVA (cerebrovascular accident) - cont Eliquis  - apixaban  (ELIQUIS ) 5 MG TABS tablet; Take 1 tablet (5 mg total) by mouth 2 (two) times daily. Take evening dose starting 07/09/20  Dispense: 30 tablet; Refill: 0 - pravastatin  (PRAVACHOL ) 80 MG tablet; Take 1  tablet (80 mg total) by mouth every evening.  Dispense: 30 tablet; Refill: 0  6. Urinary retention - 12/26 tamsulosin  started  - improved UOP  7. S/P TAVR (transcatheter aortic valve replacement)  8. Essential hypertension - controlled, goal < 140/80 - hydrochlorothiazide  (HYDRODIURIL ) 25 MG tablet; Take 1 tablet (25 mg total) by mouth daily.  Dispense: 30 tablet; Refill: 0    Patient is being discharged with the following home health services:  PT/OT  Patient is being discharged with the following durable medical equipment:  Walker  Patient has been advised to f/u with their PCP in 1-2 weeks to for a transitions of care visit.  Social services at their facility was responsible for arranging this appointment.  Pt was provided with adequate prescriptions of noncontrolled medications to reach the scheduled appointment .  For controlled substances, a limited supply was provided as appropriate for the individual patient.  If the pt normally receives these medications from a pain clinic or has a contract with another physician, these medications should be received from that clinic or physician only).    Future labs/tests needed:none       [1]  Allergies Allergen Reactions   Hydralazine  Hcl Other (See Comments)    Had severe headache all night.  Nightmares.  Drugged feeling.  Extreme dizziness   Ace Inhibitors Other (See Comments)    COUGH  Product containing angiotensin-converting enzyme inhibitor (product)   Zocor [Simvastatin] Other (See Comments)    MYALGIA   Nsaids Other (See Comments)    Would avoid given anticoagulation   Prednisone  Other (See Comments)    Prednisone  caused pt to have elevated BP.   Cephalexin Rash   Chocolate Other (See Comments)    Headaches    Doxycycline  Other (See Comments)    Nausea and abd pain   Gabapentin  Other (See Comments)    Dizziness  Hycodan [Hydrocodone  Bit-Homatrop Mbr] Nausea And Vomiting   Oxycodone  Nausea Only    Pt states that  he cannot take prescription pain medication  [2]  Allergies Allergen Reactions   Hydralazine  Hcl Other (See Comments)    Had severe headache all night.  Nightmares.  Drugged feeling.  Extreme dizziness   Ace Inhibitors Other (See Comments)    COUGH  Product containing angiotensin-converting enzyme inhibitor (product)   Zocor [Simvastatin] Other (See Comments)    MYALGIA   Nsaids Other (See Comments)    Would avoid given anticoagulation   Prednisone  Other (See Comments)    Prednisone  caused pt to have elevated BP.   Cephalexin Rash   Chocolate Other (See Comments)    Headaches    Doxycycline  Other (See Comments)    Nausea and abd pain   Gabapentin  Other (See Comments)    Dizziness   Hycodan [Hydrocodone  Bit-Homatrop Mbr] Nausea And Vomiting   Oxycodone  Nausea Only    Pt states that he cannot take prescription pain medication   "

## 2024-05-09 ENCOUNTER — Ambulatory Visit (INDEPENDENT_AMBULATORY_CARE_PROVIDER_SITE_OTHER): Admitting: Adult Health

## 2024-05-09 ENCOUNTER — Encounter: Payer: Self-pay | Admitting: Adult Health

## 2024-05-09 VITALS — BP 141/67 | HR 58 | Ht 70.0 in | Wt 162.0 lb

## 2024-05-09 DIAGNOSIS — Z09 Encounter for follow-up examination after completed treatment for conditions other than malignant neoplasm: Secondary | ICD-10-CM | POA: Diagnosis not present

## 2024-05-09 DIAGNOSIS — I639 Cerebral infarction, unspecified: Secondary | ICD-10-CM | POA: Diagnosis not present

## 2024-05-09 MED ORDER — PRAVASTATIN SODIUM 80 MG PO TABS: 80.0000 mg | ORAL_TABLET | Freq: Every evening | ORAL | 0 refills | Status: AC

## 2024-05-09 MED ORDER — BUPROPION HCL ER (XL) 150 MG PO TB24: 150.0000 mg | ORAL_TABLET | Freq: Every day | ORAL | 0 refills | Status: DC

## 2024-05-09 MED ORDER — SERTRALINE HCL 50 MG PO TABS: 50.0000 mg | ORAL_TABLET | Freq: Every day | ORAL | 0 refills | Status: DC

## 2024-05-09 MED ORDER — HYDROCHLOROTHIAZIDE 25 MG PO TABS: 25.0000 mg | ORAL_TABLET | Freq: Every day | ORAL | 0 refills | Status: AC

## 2024-05-09 MED ORDER — APIXABAN 5 MG PO TABS: 5.0000 mg | ORAL_TABLET | Freq: Two times a day (BID) | ORAL | 0 refills | Status: AC

## 2024-05-09 NOTE — Patient Instructions (Addendum)
 Overall stable from stroke standpoint, neuro exam intact without any concerning findings. He does mention short term memory difficulties which are not new. Continue to do cognitive therapy and ongoing monitoring by facility/PCP  Continue to work with therapies at facility  Continue to work with [facility] for ongoing evaluation of persistent dizziness   Continue Eliquis   and pravastatin   for secondary stroke prevention  Continue to follow up with PCP regarding blood pressure and cholesterol management  Maintain strict control of hypertension with blood pressure goal below 130/90 and cholesterol with LDL cholesterol (bad cholesterol) goal below 70 mg/dL.   Signs of a Stroke? Follow the BEFAST method:  Balance Watch for a sudden loss of balance, trouble with coordination or vertigo Eyes Is there a sudden loss of vision in one or both eyes? Or double vision?  Face: Ask the person to smile. Does one side of the face droop or is it numb?  Arms: Ask the person to raise both arms. Does one arm drift downward? Is there weakness or numbness of a leg? Speech: Ask the person to repeat a simple phrase. Does the speech sound slurred/strange? Is the person confused ? Time: If you observe any of these signs, call 911.        Thank you for coming to see us  at [facility]. I hope we have been able to provide you high quality care today.  You may receive a patient satisfaction survey over the next few weeks. We would appreciate your feedback and comments so that we may continue to improve ourselves and the health of our patients.

## 2024-05-09 NOTE — Progress Notes (Signed)
 I agree with the above plan

## 2024-05-10 ENCOUNTER — Other Ambulatory Visit: Payer: Self-pay | Admitting: *Deleted

## 2024-05-10 DIAGNOSIS — I10 Essential (primary) hypertension: Secondary | ICD-10-CM

## 2024-05-10 NOTE — Patient Outreach (Signed)
" ° °  05/10/2024  [name] [name] Sep 23, 1934 [id number]   [name] utilized [facility] SNF waiver for admission to [facility].   Update received from Alfonso Lavella Eric Admissions Director. [name] transitioned home today 05/10/24 with [facility]. Lives alone.   [name] has medical history for A-fib on Eliquis , CAD s/p CABG, aortic stenosis s/p TAVR 2024, CHB s/p single-chamber PPM December 2024, HTN.  Will refer to [facility] for complex care management services.    Pablo Hurst, MSN, RN, BSN [place]  [facility], [facility] RN Care Manager Direct Dial: [phone]                "

## 2024-05-11 ENCOUNTER — Encounter: Payer: Self-pay | Admitting: Family Medicine

## 2024-05-11 DIAGNOSIS — F329 Major depressive disorder, single episode, unspecified: Secondary | ICD-10-CM

## 2024-05-13 ENCOUNTER — Ambulatory Visit: Payer: Self-pay

## 2024-05-13 NOTE — Telephone Encounter (Signed)
 Patient called in on an update in regards to this. Stated that he's not able to sleep and the medication id hurting him instead of helping him.

## 2024-05-13 NOTE — Telephone Encounter (Signed)
 Copied from CRM #[id number]. Topic: Clinical - Medical Advice >> May 13, 2024  9:28 AM Deaijah H wrote: Reason for CRM: Patients daughter called in stating patient was put on Zloft and wellbutrin  but doesn't think it's doing him any good, due to insomnia not sleeping at night. Up pacing and agitated at night would like advice on what to do. Please call.

## 2024-05-13 NOTE — Telephone Encounter (Signed)
 FYI Only or Action Required?: Action required by provider: Pt needs instructions on how to safely discontinue Wellbutrin  and Zoloft ., both were recently started,.  Pt is requesting a call back ASAP.  Patient was last seen in primary care on 12/28/2023 by Cleatus Arlyss RAMAN, MD.  Called Nurse Triage reporting Medication Problem. - Pt believes that these 2 meds are causing him to have insomnia.  Symptoms began After starting these medications.  Interventions attempted: Nothing.  Symptoms are: gradually worsening.  Triage Disposition: Call PCP Now  Patient/caregiver understands and will follow disposition?: Yes                  Reason for Triage: Patient has been experiencing dizziness and extreme insomnia ever since he started talking WELLBUTRIN  and zoloft    Reason for Disposition  [1] Pharmacy calling with prescription question AND [2] triager unable to answer question  Answer Assessment - Initial Assessment Questions 1. NAME of MEDICINE: What medicine(s) are you calling about?     Zoloft  and Wellbutrin  2. QUESTION: What is your question? (e.g., double dose of medicine, side effect)     Pt wants to stop these medications. He believes that they are causing him  him to have insomnia. 3. PRESCRIBER: Who prescribed the medicine? Reason: if prescribed by specialist, call should be referred to that group.     [facility] 4. SYMPTOMS: Do you have any symptoms? If Yes, ask: What symptoms are you having?  How bad are the symptoms (e.g., mild, moderate, severe)     Insomnia  Protocols used: Medication Question Call-A-AH

## 2024-05-14 MED ORDER — BUPROPION HCL 75 MG PO TABS: 75.0000 mg | ORAL_TABLET | Freq: Every day | ORAL | 0 refills | Status: DC

## 2024-05-14 MED ORDER — SERTRALINE HCL 25 MG PO TABS: 25.0000 mg | ORAL_TABLET | Freq: Every day | ORAL | 0 refills | Status: DC

## 2024-05-14 NOTE — Telephone Encounter (Signed)
 See mychart message.  I responded there.  Please check to make sure the patient saw the mychart message.  If he didn't, then please call him about the fpl group.  Thanks.

## 2024-05-14 NOTE — Telephone Encounter (Addendum)
 Mychart message was last read by Fairy LELON Messing at 11:59AM on 05/14/2024.

## 2024-05-14 NOTE — Addendum Note (Signed)
 Addended by: CLEATUS LORELI RAMAN on: 05/14/2024 09:20 AM   Modules accepted: Orders

## 2024-05-15 ENCOUNTER — Telehealth: Payer: Self-pay

## 2024-05-15 NOTE — Progress Notes (Addendum)
 Complex Care Management Note  Care Guide Note 05/15/2024 Name: EGIDIO LOFGREN MRN: [id number] DOB: 06/22/1934  SAAGAR TORTORELLA is a 89 y.o. year old male who sees Cleatus Arlyss RAMAN, MD for primary care. I reached out to Fairy LELON Messing by phone today to offer complex care management services.  Mr. Wicklund was given information about Complex Care Management services today including:   The Complex Care Management services include support from the care team which includes your Nurse Care Manager, Clinical Social Worker, or Pharmacist.  The Complex Care Management team is here to help remove barriers to the health concerns and goals most important to you. Complex Care Management services are voluntary, and the patient may decline or stop services at any time by request to their care team member.   Complex Care Management Consent Status: Patient agreed to services and verbal consent obtained.   Follow up plan:  Telephone appointment with complex care management team member scheduled for:  05/21/24 at 2:00 p.m.   Encounter Outcome:  Patient Scheduled  Dreama [facility]  [facility], [facility] VBCI Assistant Direct Dial: [phone]  Fax: [phone]

## 2024-05-21 ENCOUNTER — Ambulatory Visit: Admitting: Family Medicine

## 2024-05-21 ENCOUNTER — Telehealth: Payer: Self-pay

## 2024-05-23 ENCOUNTER — Telehealth

## 2024-05-24 ENCOUNTER — Ambulatory Visit: Admitting: Family Medicine

## 2024-05-24 ENCOUNTER — Encounter: Payer: Self-pay | Admitting: Family Medicine

## 2024-05-24 VITALS — BP 118/64 | HR 60 | Temp 97.7°F | Ht 70.0 in | Wt 166.5 lb

## 2024-05-24 DIAGNOSIS — R202 Paresthesia of skin: Secondary | ICD-10-CM

## 2024-05-24 DIAGNOSIS — G2581 Restless legs syndrome: Secondary | ICD-10-CM

## 2024-05-24 DIAGNOSIS — Z862 Personal history of diseases of the blood and blood-forming organs and certain disorders involving the immune mechanism: Secondary | ICD-10-CM | POA: Diagnosis not present

## 2024-05-24 LAB — CBC WITH DIFFERENTIAL/PLATELET
Basophils Absolute: 0 10*3/uL (ref 0.0–0.1)
Basophils Relative: 0.7 % (ref 0.0–3.0)
Eosinophils Absolute: 0.3 10*3/uL (ref 0.0–0.7)
Eosinophils Relative: 5 % (ref 0.0–5.0)
HCT: 33.7 % — ABNORMAL LOW (ref 39.0–52.0)
Hemoglobin: 11.7 g/dL — ABNORMAL LOW (ref 13.0–17.0)
Lymphocytes Relative: 16.5 % (ref 12.0–46.0)
Lymphs Abs: 1 10*3/uL (ref 0.7–4.0)
MCHC: 34.7 g/dL (ref 30.0–36.0)
MCV: 96.1 fl (ref 78.0–100.0)
Monocytes Absolute: 0.7 10*3/uL (ref 0.1–1.0)
Monocytes Relative: 10.9 % (ref 3.0–12.0)
Neutro Abs: 4.1 10*3/uL (ref 1.4–7.7)
Neutrophils Relative %: 66.9 % (ref 43.0–77.0)
Platelets: 241 10*3/uL (ref 150.0–400.0)
RBC: 3.51 Mil/uL — ABNORMAL LOW (ref 4.22–5.81)
RDW: 14.8 % (ref 11.5–15.5)
WBC: 6.1 10*3/uL (ref 4.0–10.5)

## 2024-05-24 LAB — TSH: TSH: 1.86 u[IU]/mL (ref 0.35–5.50)

## 2024-05-24 LAB — FERRITIN: Ferritin: 156.8 ng/mL (ref 22.0–322.0)

## 2024-05-24 LAB — VITAMIN B12: Vitamin B-12: 290 pg/mL (ref 211–911)

## 2024-05-24 NOTE — Patient Instructions (Addendum)
 Please go to the eye clinic when possible.  Labs today.  If I see a treatable option, we'll work on that.  If not, I can think of another option with meds to help with sleep.  Take care.  Glad to see you.

## 2024-05-24 NOTE — Progress Notes (Unsigned)
 He had hearing aids but one is broken and one is lost.  He is working on getting replacement.    Lower weight noted.    D/w pt about mood, related to his illness and caring for his wife prior to her death.    He is off wellbutrin  and sertraline .   He had used meclizine  prn, not used frequently.    Using walker.  Sleep disrupted from leg pain but he had sleep difficulty at baseline, setting his knee aside.    L knee MRI.   1. Small subchondral impaction type fracture involving the lateral tibial plateau posteriorly with surrounding marrow edema. 2. Horizontal cleavage and free edge tears involving the posterior horn and midbody junction region of the medial meniscus. 3. Intact ligamentous structures. 4. Very large joint effusion which is complicated by hematoma. There is also a long and thick ligamentum mucosa noted. Areas of synovial thickening are also noted. 5. Moderate-sized leaking Baker's cyst also containing complex fluid, likely hematoma. 6. Tricompartmental degenerative changes most significant in the patellofemoral joint.  Living at home alone.  He wants to stay at home.   D/w pt.   He is still dealing with L knee pain on ROM.  He is still dealing with imbalance at baseline, using walker.   He is going to see eye clinic when possible about blurry vision.   He had post nasal gtt and nasal discharge.  No fevers.  Some occ sputum.  Some occ cough, throat clearing.  Some sneezing.    He has some R lower lateral back pain, possibly from coughing.    He has a creepy crawly ant like sensation on the legs at night.   Meds, vitals, and allergies reviewed.   ROS: Per HPI unless specifically indicated in ROS section

## 2024-05-26 ENCOUNTER — Encounter: Payer: Self-pay | Admitting: Family Medicine

## 2024-05-27 ENCOUNTER — Ambulatory Visit: Payer: Self-pay | Admitting: Family Medicine

## 2024-05-27 ENCOUNTER — Telehealth: Payer: Self-pay

## 2024-05-27 ENCOUNTER — Ambulatory Visit: Payer: Self-pay

## 2024-05-27 DIAGNOSIS — R202 Paresthesia of skin: Secondary | ICD-10-CM | POA: Insufficient documentation

## 2024-05-27 MED ORDER — PREGABALIN 25 MG PO CAPS: 25.0000 mg | ORAL_CAPSULE | Freq: Every day | ORAL | 1 refills | Status: AC

## 2024-05-27 NOTE — Progress Notes (Unsigned)
 Complex Care Management Note Care Guide Note  05/27/2024 Name: TRIGG DELAROCHA MRN: [id number] DOB: Aug 30, 1934   Complex Care Management Outreach Attempts: An unsuccessful telephone outreach was attempted today to offer the patient information about available complex care management services.  Follow Up Plan:  Additional outreach attempts will be made to offer the patient complex care management information and services.   Encounter Outcome:  No Answer  Dreama [facility]  [facility], [facility] VBCI Assistant Direct Dial: [phone]  Fax: [phone]

## 2024-05-27 NOTE — Assessment & Plan Note (Signed)
 He has a creepy crawly ant like sensation on the legs at night.  That is the main issue disrupting sleep.  Sensation grossly intact and wnl BLE today at OV.  D/w pt about options.  See notes on labs.  If sig abnormal, we'll address.  If not, we can see about other RLS tx options.    D/w pt about goals for care, ie to stay at home.  Improving his sleep would be the main intervention to improve his mood/overall situation at this point.  See AVS.  Okay for outpatient f/u.

## 2024-05-27 NOTE — Telephone Encounter (Signed)
 See result note.

## 2024-05-29 NOTE — Progress Notes (Signed)
 Complex Care Management Note  Care Guide Note 05/29/2024 Name: [name] [name] MRN: [id number] DOB: 1935-02-22  [name] is a 89 y.o. year old male who sees Cleatus Arlyss RAMAN, MD for primary care. I reached out to Fairy LELON Messing by phone today to offer complex care management services.  [name] was given information about Complex Care Management services today including:   The Complex Care Management services include support from the care team which includes your Nurse Care Manager, Clinical Social Worker, or Pharmacist.  The Complex Care Management team is here to help remove barriers to the health concerns and goals most important to you. Complex Care Management services are voluntary, and the patient may decline or stop services at any time by request to their care team member.   Complex Care Management Consent Status: Patient did not agree to participate in complex care management services at this time.  Follow up plan:  Patient states will follow up with PCP.  Encounter Outcome:  Patient Refused  Dreama Agent [facility], [facility] VBCI Assistant Direct Dial: [phone]  Fax: [phone]

## 2024-06-28 ENCOUNTER — Encounter
